# Patient Record
Sex: Male | Born: 1935
Health system: Southern US, Community
[De-identification: ages and names within clinical notes are randomized; demographics above are authoritative.]

## PROBLEM LIST (undated history)

## (undated) DIAGNOSIS — I779 Disorder of arteries and arterioles, unspecified: Secondary | ICD-10-CM

## (undated) DIAGNOSIS — I499 Cardiac arrhythmia, unspecified: Secondary | ICD-10-CM

## (undated) DIAGNOSIS — N189 Chronic kidney disease, unspecified: Secondary | ICD-10-CM

## (undated) DIAGNOSIS — Z923 Personal history of irradiation: Secondary | ICD-10-CM

## (undated) DIAGNOSIS — I2510 Atherosclerotic heart disease of native coronary artery without angina pectoris: Secondary | ICD-10-CM

## (undated) DIAGNOSIS — F419 Anxiety disorder, unspecified: Secondary | ICD-10-CM

## (undated) DIAGNOSIS — N400 Enlarged prostate without lower urinary tract symptoms: Secondary | ICD-10-CM

## (undated) DIAGNOSIS — R609 Edema, unspecified: Secondary | ICD-10-CM

## (undated) DIAGNOSIS — E785 Hyperlipidemia, unspecified: Secondary | ICD-10-CM

## (undated) DIAGNOSIS — I10 Essential (primary) hypertension: Secondary | ICD-10-CM

## (undated) DIAGNOSIS — I739 Peripheral vascular disease, unspecified: Secondary | ICD-10-CM

## (undated) DIAGNOSIS — R7303 Prediabetes: Secondary | ICD-10-CM

## (undated) DIAGNOSIS — R519 Headache, unspecified: Secondary | ICD-10-CM

## (undated) DIAGNOSIS — Z87442 Personal history of urinary calculi: Secondary | ICD-10-CM

## (undated) DIAGNOSIS — K219 Gastro-esophageal reflux disease without esophagitis: Secondary | ICD-10-CM

## (undated) DIAGNOSIS — M1990 Unspecified osteoarthritis, unspecified site: Secondary | ICD-10-CM

## (undated) DIAGNOSIS — D126 Benign neoplasm of colon, unspecified: Secondary | ICD-10-CM

## (undated) DIAGNOSIS — D689 Coagulation defect, unspecified: Secondary | ICD-10-CM

## (undated) DIAGNOSIS — D649 Anemia, unspecified: Secondary | ICD-10-CM

## (undated) DIAGNOSIS — C61 Malignant neoplasm of prostate: Secondary | ICD-10-CM

## (undated) DIAGNOSIS — M545 Low back pain, unspecified: Secondary | ICD-10-CM

## (undated) DIAGNOSIS — G4730 Sleep apnea, unspecified: Secondary | ICD-10-CM

## (undated) DIAGNOSIS — C679 Malignant neoplasm of bladder, unspecified: Secondary | ICD-10-CM

## (undated) DIAGNOSIS — K589 Irritable bowel syndrome without diarrhea: Secondary | ICD-10-CM

## (undated) DIAGNOSIS — K5792 Diverticulitis of intestine, part unspecified, without perforation or abscess without bleeding: Secondary | ICD-10-CM

## (undated) DIAGNOSIS — I639 Cerebral infarction, unspecified: Secondary | ICD-10-CM

## (undated) DIAGNOSIS — F17200 Nicotine dependence, unspecified, uncomplicated: Secondary | ICD-10-CM

## (undated) DIAGNOSIS — I509 Heart failure, unspecified: Secondary | ICD-10-CM

## (undated) HISTORY — DX: Peripheral vascular disease, unspecified: I73.9

## (undated) HISTORY — DX: Coagulation defect, unspecified: D68.9

## (undated) HISTORY — DX: Malignant neoplasm of bladder, unspecified: C67.9

## (undated) HISTORY — DX: Diverticulitis of intestine, part unspecified, without perforation or abscess without bleeding: K57.92

## (undated) HISTORY — DX: Essential (primary) hypertension: I10

## (undated) HISTORY — DX: Benign neoplasm of colon, unspecified: D12.6

## (undated) HISTORY — DX: Atherosclerotic heart disease of native coronary artery without angina pectoris: I25.10

## (undated) HISTORY — DX: Benign prostatic hyperplasia without lower urinary tract symptoms: N40.0

## (undated) HISTORY — DX: Heart failure, unspecified: I50.9

## (undated) HISTORY — PX: LITHOTRIPSY: SUR834

## (undated) HISTORY — DX: Irritable bowel syndrome, unspecified: K58.9

## (undated) HISTORY — DX: Cerebral infarction, unspecified: I63.9

## (undated) HISTORY — PX: ESOPHAGOGASTRODUODENOSCOPY (EGD) WITH ESOPHAGEAL DILATION: SHX5812

## (undated) HISTORY — DX: Malignant neoplasm of prostate: C61

## (undated) HISTORY — DX: Nicotine dependence, unspecified, uncomplicated: F17.200

## (undated) HISTORY — PX: CORONARY ANGIOPLASTY: SHX604

## (undated) HISTORY — DX: Hyperlipidemia, unspecified: E78.5

## (undated) HISTORY — PX: LUMBAR LAMINECTOMY: SHX95

## (undated) HISTORY — PX: CATARACT EXTRACTION W/ INTRAOCULAR LENS  IMPLANT, BILATERAL: SHX1307

## (undated) HISTORY — DX: Gastro-esophageal reflux disease without esophagitis: K21.9

## (undated) HISTORY — DX: Low back pain: M54.5

## (undated) HISTORY — DX: Low back pain, unspecified: M54.50

## (undated) HISTORY — DX: Disorder of arteries and arterioles, unspecified: I77.9

## (undated) HISTORY — PX: HEMORRHOID BANDING: SHX5850

## (undated) HISTORY — DX: Sleep apnea, unspecified: G47.30

## (undated) HISTORY — PX: APPENDECTOMY: SHX54

---

## 1999-01-13 ENCOUNTER — Emergency Department (HOSPITAL_COMMUNITY): Admission: EM | Admit: 1999-01-13 | Discharge: 1999-01-13 | Payer: Self-pay | Admitting: Emergency Medicine

## 1999-01-13 ENCOUNTER — Encounter: Payer: Self-pay | Admitting: Emergency Medicine

## 1999-01-15 ENCOUNTER — Emergency Department (HOSPITAL_COMMUNITY): Admission: EM | Admit: 1999-01-15 | Discharge: 1999-01-15 | Payer: Self-pay | Admitting: Emergency Medicine

## 1999-01-15 ENCOUNTER — Encounter: Payer: Self-pay | Admitting: Urology

## 1999-01-17 ENCOUNTER — Encounter: Payer: Self-pay | Admitting: Urology

## 1999-01-19 ENCOUNTER — Ambulatory Visit (HOSPITAL_COMMUNITY): Admission: RE | Admit: 1999-01-19 | Discharge: 1999-01-19 | Payer: Self-pay | Admitting: Urology

## 1999-01-19 ENCOUNTER — Encounter: Payer: Self-pay | Admitting: Urology

## 1999-02-21 ENCOUNTER — Ambulatory Visit (HOSPITAL_COMMUNITY): Admission: RE | Admit: 1999-02-21 | Discharge: 1999-02-21 | Payer: Self-pay | Admitting: Urology

## 1999-02-21 ENCOUNTER — Encounter: Payer: Self-pay | Admitting: Urology

## 1999-03-06 ENCOUNTER — Encounter: Payer: Self-pay | Admitting: Urology

## 1999-03-06 ENCOUNTER — Ambulatory Visit (HOSPITAL_COMMUNITY): Admission: RE | Admit: 1999-03-06 | Discharge: 1999-03-06 | Payer: Self-pay | Admitting: Urology

## 2000-08-24 ENCOUNTER — Encounter: Payer: Self-pay | Admitting: Emergency Medicine

## 2000-08-24 ENCOUNTER — Emergency Department (HOSPITAL_COMMUNITY): Admission: EM | Admit: 2000-08-24 | Discharge: 2000-08-24 | Payer: Self-pay | Admitting: Emergency Medicine

## 2000-08-31 ENCOUNTER — Encounter: Payer: Self-pay | Admitting: *Deleted

## 2000-08-31 ENCOUNTER — Ambulatory Visit (HOSPITAL_COMMUNITY): Admission: RE | Admit: 2000-08-31 | Discharge: 2000-08-31 | Payer: Self-pay | Admitting: *Deleted

## 2000-09-16 ENCOUNTER — Encounter: Payer: Self-pay | Admitting: *Deleted

## 2000-09-17 ENCOUNTER — Encounter: Payer: Self-pay | Admitting: *Deleted

## 2000-09-17 ENCOUNTER — Ambulatory Visit (HOSPITAL_COMMUNITY): Admission: RE | Admit: 2000-09-17 | Discharge: 2000-09-18 | Payer: Self-pay | Admitting: *Deleted

## 2003-10-27 HISTORY — PX: TRANSURETHRAL RESECTION OF PROSTATE: SHX73

## 2003-11-05 ENCOUNTER — Inpatient Hospital Stay (HOSPITAL_COMMUNITY): Admission: RE | Admit: 2003-11-05 | Discharge: 2003-11-07 | Payer: Self-pay | Admitting: Urology

## 2003-11-05 ENCOUNTER — Encounter (INDEPENDENT_AMBULATORY_CARE_PROVIDER_SITE_OTHER): Payer: Self-pay | Admitting: Specialist

## 2004-04-12 ENCOUNTER — Ambulatory Visit: Payer: Self-pay | Admitting: Pulmonary Disease

## 2004-10-11 ENCOUNTER — Ambulatory Visit (HOSPITAL_COMMUNITY): Admission: RE | Admit: 2004-10-11 | Discharge: 2004-10-11 | Payer: Self-pay | Admitting: Pulmonary Disease

## 2004-10-11 ENCOUNTER — Ambulatory Visit: Payer: Self-pay | Admitting: Pulmonary Disease

## 2004-11-07 ENCOUNTER — Ambulatory Visit: Payer: Self-pay | Admitting: Gastroenterology

## 2004-11-21 ENCOUNTER — Encounter (INDEPENDENT_AMBULATORY_CARE_PROVIDER_SITE_OTHER): Payer: Self-pay | Admitting: *Deleted

## 2004-11-21 ENCOUNTER — Ambulatory Visit: Payer: Self-pay | Admitting: Gastroenterology

## 2005-04-11 ENCOUNTER — Ambulatory Visit: Payer: Self-pay | Admitting: Pulmonary Disease

## 2006-03-25 ENCOUNTER — Ambulatory Visit: Payer: Self-pay | Admitting: Pulmonary Disease

## 2006-05-15 ENCOUNTER — Emergency Department (HOSPITAL_COMMUNITY): Admission: EM | Admit: 2006-05-15 | Discharge: 2006-05-15 | Payer: Self-pay | Admitting: Emergency Medicine

## 2006-05-28 DIAGNOSIS — C61 Malignant neoplasm of prostate: Secondary | ICD-10-CM

## 2006-05-28 HISTORY — DX: Malignant neoplasm of prostate: C61

## 2006-06-14 ENCOUNTER — Ambulatory Visit (HOSPITAL_BASED_OUTPATIENT_CLINIC_OR_DEPARTMENT_OTHER): Admission: RE | Admit: 2006-06-14 | Discharge: 2006-06-14 | Payer: Self-pay | Admitting: Urology

## 2007-03-26 ENCOUNTER — Ambulatory Visit: Payer: Self-pay | Admitting: Pulmonary Disease

## 2007-05-02 ENCOUNTER — Ambulatory Visit (HOSPITAL_COMMUNITY): Admission: RE | Admit: 2007-05-02 | Discharge: 2007-05-02 | Payer: Self-pay | Admitting: Urology

## 2007-05-19 ENCOUNTER — Encounter: Payer: Self-pay | Admitting: Pulmonary Disease

## 2007-05-29 ENCOUNTER — Ambulatory Visit: Admission: RE | Admit: 2007-05-29 | Discharge: 2007-08-27 | Payer: Self-pay | Admitting: Radiation Oncology

## 2007-06-02 ENCOUNTER — Encounter: Payer: Self-pay | Admitting: Pulmonary Disease

## 2007-08-25 ENCOUNTER — Encounter: Admission: RE | Admit: 2007-08-25 | Discharge: 2007-08-25 | Payer: Self-pay | Admitting: Urology

## 2007-08-28 ENCOUNTER — Ambulatory Visit: Admission: RE | Admit: 2007-08-28 | Discharge: 2007-11-19 | Payer: Self-pay | Admitting: Radiation Oncology

## 2007-09-02 ENCOUNTER — Encounter: Payer: Self-pay | Admitting: Pulmonary Disease

## 2007-10-03 ENCOUNTER — Ambulatory Visit (HOSPITAL_BASED_OUTPATIENT_CLINIC_OR_DEPARTMENT_OTHER): Admission: RE | Admit: 2007-10-03 | Discharge: 2007-10-03 | Payer: Self-pay | Admitting: Urology

## 2008-07-02 ENCOUNTER — Ambulatory Visit: Payer: Self-pay | Admitting: Internal Medicine

## 2008-07-02 DIAGNOSIS — N401 Enlarged prostate with lower urinary tract symptoms: Secondary | ICD-10-CM

## 2008-07-02 DIAGNOSIS — M545 Low back pain, unspecified: Secondary | ICD-10-CM | POA: Insufficient documentation

## 2008-07-02 DIAGNOSIS — K219 Gastro-esophageal reflux disease without esophagitis: Secondary | ICD-10-CM | POA: Insufficient documentation

## 2008-07-02 DIAGNOSIS — Z860100 Personal history of colon polyps, unspecified: Secondary | ICD-10-CM | POA: Insufficient documentation

## 2008-07-02 DIAGNOSIS — N138 Other obstructive and reflux uropathy: Secondary | ICD-10-CM | POA: Insufficient documentation

## 2008-07-02 DIAGNOSIS — I10 Essential (primary) hypertension: Secondary | ICD-10-CM | POA: Insufficient documentation

## 2008-07-02 DIAGNOSIS — J40 Bronchitis, not specified as acute or chronic: Secondary | ICD-10-CM | POA: Insufficient documentation

## 2008-07-02 DIAGNOSIS — K589 Irritable bowel syndrome without diarrhea: Secondary | ICD-10-CM | POA: Insufficient documentation

## 2008-07-02 DIAGNOSIS — E785 Hyperlipidemia, unspecified: Secondary | ICD-10-CM | POA: Insufficient documentation

## 2008-09-06 ENCOUNTER — Ambulatory Visit: Payer: Self-pay | Admitting: Pulmonary Disease

## 2008-09-06 ENCOUNTER — Telehealth (INDEPENDENT_AMBULATORY_CARE_PROVIDER_SITE_OTHER): Payer: Self-pay | Admitting: *Deleted

## 2008-09-06 DIAGNOSIS — C61 Malignant neoplasm of prostate: Secondary | ICD-10-CM | POA: Insufficient documentation

## 2008-09-06 LAB — CONVERTED CEMR LAB: Blood Glucose, Fingerstick: 130

## 2008-09-07 ENCOUNTER — Encounter: Payer: Self-pay | Admitting: Pulmonary Disease

## 2008-09-08 DIAGNOSIS — J329 Chronic sinusitis, unspecified: Secondary | ICD-10-CM | POA: Insufficient documentation

## 2008-09-08 DIAGNOSIS — I2510 Atherosclerotic heart disease of native coronary artery without angina pectoris: Secondary | ICD-10-CM | POA: Insufficient documentation

## 2008-09-08 DIAGNOSIS — J309 Allergic rhinitis, unspecified: Secondary | ICD-10-CM | POA: Insufficient documentation

## 2008-09-08 DIAGNOSIS — E119 Type 2 diabetes mellitus without complications: Secondary | ICD-10-CM | POA: Insufficient documentation

## 2008-09-08 DIAGNOSIS — I679 Cerebrovascular disease, unspecified: Secondary | ICD-10-CM | POA: Insufficient documentation

## 2008-09-08 DIAGNOSIS — Z9861 Coronary angioplasty status: Secondary | ICD-10-CM

## 2008-09-08 DIAGNOSIS — F17200 Nicotine dependence, unspecified, uncomplicated: Secondary | ICD-10-CM | POA: Insufficient documentation

## 2008-09-08 LAB — CONVERTED CEMR LAB
AST: 20 units/L (ref 0–37)
Albumin: 4.2 g/dL (ref 3.5–5.2)
Alkaline Phosphatase: 53 units/L (ref 39–117)
BUN: 19 mg/dL (ref 6–23)
Basophils Absolute: 0 10*3/uL (ref 0.0–0.1)
Basophils Relative: 0.3 % (ref 0.0–3.0)
Chloride: 109 meq/L (ref 96–112)
Eosinophils Relative: 1.7 % (ref 0.0–5.0)
GFR calc non Af Amer: 77.61 mL/min (ref >60)
Glucose, Bld: 112 mg/dL — ABNORMAL HIGH (ref 70–99)
LDL Cholesterol: 99 mg/dL (ref 0–99)
Lymphs Abs: 1.1 10*3/uL (ref 0.7–4.0)
MCHC: 34.1 g/dL (ref 30.0–36.0)
MCV: 89.7 fL (ref 78.0–100.0)
Neutro Abs: 3.5 10*3/uL (ref 1.4–7.7)
Neutrophils Relative %: 68.2 % (ref 43.0–77.0)
Potassium: 4.3 meq/L (ref 3.5–5.1)
RDW: 13 % (ref 11.5–14.6)
Total CHOL/HDL Ratio: 4
VLDL: 24 mg/dL (ref 0.0–40.0)

## 2008-09-13 ENCOUNTER — Telehealth (INDEPENDENT_AMBULATORY_CARE_PROVIDER_SITE_OTHER): Payer: Self-pay | Admitting: *Deleted

## 2008-09-28 ENCOUNTER — Telehealth (INDEPENDENT_AMBULATORY_CARE_PROVIDER_SITE_OTHER): Payer: Self-pay | Admitting: *Deleted

## 2008-09-30 ENCOUNTER — Ambulatory Visit: Payer: Self-pay | Admitting: Internal Medicine

## 2008-10-21 ENCOUNTER — Ambulatory Visit: Payer: Self-pay | Admitting: Internal Medicine

## 2008-10-21 HISTORY — PX: COLONOSCOPY: SHX174

## 2008-10-28 ENCOUNTER — Encounter: Payer: Self-pay | Admitting: Pulmonary Disease

## 2008-11-15 ENCOUNTER — Ambulatory Visit (HOSPITAL_COMMUNITY): Admission: RE | Admit: 2008-11-15 | Discharge: 2008-11-15 | Payer: Self-pay | Admitting: Urology

## 2009-03-01 ENCOUNTER — Encounter: Payer: Self-pay | Admitting: Pulmonary Disease

## 2009-03-14 ENCOUNTER — Telehealth: Payer: Self-pay | Admitting: Pulmonary Disease

## 2009-03-15 ENCOUNTER — Ambulatory Visit: Payer: Self-pay | Admitting: Pulmonary Disease

## 2009-03-16 LAB — CONVERTED CEMR LAB
CO2: 31 meq/L (ref 19–32)
Creatinine, Ser: 1.1 mg/dL (ref 0.4–1.5)
GFR calc non Af Amer: 69.43 mL/min (ref >60)
Sodium: 141 meq/L (ref 135–145)

## 2009-04-13 ENCOUNTER — Ambulatory Visit: Payer: Self-pay | Admitting: Pulmonary Disease

## 2009-05-04 ENCOUNTER — Encounter: Payer: Self-pay | Admitting: Pulmonary Disease

## 2009-05-19 ENCOUNTER — Encounter: Payer: Self-pay | Admitting: Pulmonary Disease

## 2009-06-10 ENCOUNTER — Encounter: Payer: Self-pay | Admitting: Pulmonary Disease

## 2009-06-22 ENCOUNTER — Telehealth: Payer: Self-pay | Admitting: Pulmonary Disease

## 2009-07-12 ENCOUNTER — Ambulatory Visit: Payer: Self-pay | Admitting: Pulmonary Disease

## 2009-07-18 ENCOUNTER — Telehealth (INDEPENDENT_AMBULATORY_CARE_PROVIDER_SITE_OTHER): Payer: Self-pay | Admitting: *Deleted

## 2009-08-16 ENCOUNTER — Encounter: Payer: Self-pay | Admitting: Pulmonary Disease

## 2009-08-31 ENCOUNTER — Telehealth: Payer: Self-pay | Admitting: Pulmonary Disease

## 2009-09-15 ENCOUNTER — Telehealth (INDEPENDENT_AMBULATORY_CARE_PROVIDER_SITE_OTHER): Payer: Self-pay | Admitting: *Deleted

## 2009-10-03 ENCOUNTER — Telehealth: Payer: Self-pay | Admitting: Pulmonary Disease

## 2009-10-20 ENCOUNTER — Encounter: Payer: Self-pay | Admitting: Pulmonary Disease

## 2009-12-08 ENCOUNTER — Telehealth (INDEPENDENT_AMBULATORY_CARE_PROVIDER_SITE_OTHER): Payer: Self-pay | Admitting: *Deleted

## 2009-12-12 ENCOUNTER — Telehealth (INDEPENDENT_AMBULATORY_CARE_PROVIDER_SITE_OTHER): Payer: Self-pay | Admitting: *Deleted

## 2009-12-26 ENCOUNTER — Telehealth (INDEPENDENT_AMBULATORY_CARE_PROVIDER_SITE_OTHER): Payer: Self-pay | Admitting: *Deleted

## 2010-02-08 ENCOUNTER — Ambulatory Visit: Payer: Self-pay | Admitting: Pulmonary Disease

## 2010-02-09 ENCOUNTER — Ambulatory Visit: Payer: Self-pay | Admitting: Pulmonary Disease

## 2010-02-11 LAB — CONVERTED CEMR LAB
ALT: 19 units/L (ref 0–53)
Albumin: 4.3 g/dL (ref 3.5–5.2)
BUN: 32 mg/dL — ABNORMAL HIGH (ref 6–23)
Bilirubin, Direct: 0.2 mg/dL (ref 0.0–0.3)
CO2: 30 meq/L (ref 19–32)
Calcium: 9.3 mg/dL (ref 8.4–10.5)
Chloride: 107 meq/L (ref 96–112)
Eosinophils Relative: 1.9 % (ref 0.0–5.0)
HDL: 32.2 mg/dL — ABNORMAL LOW (ref >39.00)
Hemoglobin: 14.2 g/dL (ref 13.0–17.0)
Hgb A1c MFr Bld: 6.7 % — ABNORMAL HIGH (ref 4.6–6.5)
LDL Cholesterol: 66 mg/dL (ref 0–99)
Lymphocytes Relative: 27 % (ref 12.0–46.0)
MCV: 89.4 fL (ref 78.0–100.0)
Monocytes Relative: 11.5 % (ref 3.0–12.0)
Neutro Abs: 3.3 10*3/uL (ref 1.4–7.7)
Neutrophils Relative %: 59.2 % (ref 43.0–77.0)
RDW: 13.5 % (ref 11.5–14.6)
Sodium: 143 meq/L (ref 135–145)
Triglycerides: 166 mg/dL — ABNORMAL HIGH (ref 0.0–149.0)
VLDL: 33.2 mg/dL (ref 0.0–40.0)

## 2010-02-14 ENCOUNTER — Telehealth (INDEPENDENT_AMBULATORY_CARE_PROVIDER_SITE_OTHER): Payer: Self-pay | Admitting: *Deleted

## 2010-03-17 ENCOUNTER — Ambulatory Visit: Payer: Self-pay | Admitting: Pulmonary Disease

## 2010-04-07 ENCOUNTER — Encounter: Payer: Self-pay | Admitting: Pulmonary Disease

## 2010-04-10 ENCOUNTER — Telehealth (INDEPENDENT_AMBULATORY_CARE_PROVIDER_SITE_OTHER): Payer: Self-pay | Admitting: *Deleted

## 2010-06-29 NOTE — Progress Notes (Signed)
Summary: rx  Phone Note Call from Patient Call back at Home Phone [phone]   Caller: spouse-Mary Call For: nadel Reason for Call: Talk to Nurse Summary of Call: re: RX Losartan/HCTZ  -  called [facility] for refill and was told that he was no longer on this medicine per his doctor.  Can you please check on this.  Pt doesn't recall being taken off med. Initial call taken by: Eugene Gavia,  December 08, 2009 9:01 AM  Follow-up for Phone Call        lmomtcb Randell Loop [facility]  December 08, 2009 9:04 AM   pts wife called me back and stated that [facility] told them that per [place] the pt is no longer on this med.  i reviewed SN last note and notes in between and there is nothing saying we stopped this med. and it had been sent to [facility] for refills 2 months ago.  i resent the rx to [facility] for they hyzaar.  pts wife is aware Randell Loop CMA  December 08, 2009 9:23 AM     Prescriptions: HYZAAR 100-25 MG TABS (LOSARTAN POTASSIUM-HCTZ) take 1 tab by mouth once daily in the AM...  #90 x 4   Entered by:   Randell Loop CMA   Authorized by:   Michele Mcalpine MD   Signed by:   Randell Loop CMA on 12/08/2009   Method used:   Faxed to ...       [facility] (mail-order)       [address] [place], [place]  [address]       Ph: [phone]       Fax: [phone]   RxID:   [id number]

## 2010-06-29 NOTE — Progress Notes (Signed)
Summary: refill on rx and questions on med  Phone Note Call from Patient Call back at Home Phone [phone]   Caller: [name] (wife) Call For: Dillon Young Reason for Call: Refill Medication Summary of Call: wants to know what he is suppose to do with the medicine losartan. on the bottle it says take one tablet a day. dr Kriste Basque told him to take to take a half a pill a day.  she wants to know what he is really suppose to be taking.  she also would need a refill on it sent to [facility]  at [phone] it will need to be a 90 day rx  Initial call taken by: Valinda Hoar,  August 31, 2009 8:27 AM  Follow-up for Phone Call        pt wife was not aware the pt was to stop cozaar and hctz and just take hyzaar. SH elaos does not know where the rx is that SN printed on the day of the ov. Sh eis requesting rx be sent to [facility]. RX sent for hyzaar. pt aware. [name] CMA  August 31, 2009 9:28 AM     Prescriptions: HYZAAR 100-25 MG TABS (LOSARTAN POTASSIUM-HCTZ) take 1 tab by mouth once daily in the AM...  #90 x 4   Entered by:   [name] CMA   Authorized by:   Michele Mcalpine MD   Signed by:   [name] CMA on 08/31/2009   Method used:   Faxed to ...       [facility] (mail-order)             ,          Ph: [phone]       Fax: [phone]   RxID:   [id number]

## 2010-06-29 NOTE — Progress Notes (Signed)
Summary: refill losartan  Phone Note Call from Patient Call back at [facility] Phone [phone]   Caller: Patient Call For: Latesia Norrington Summary of Call: pt requests a "2 wk " supply of losartan  potassium [facility]ill. [facility] [address]  Initial call taken by: Tivis Ringer, CNA,  June 22, 2009 2:59 PM  Follow-up for Phone Call        Pt request a 2 week supply for cozaar sent to local pharmacy because he is waiting on his shipment from [facility]. [facility]  Pt also states that he is now taking cozaar 150mg  daily instead of 100mg . Also pt states that he needs rx for HCTZ sent to [facility] for 90 days with refills. This med has also changed. he is now taking HCTZ 12.5 twice daily. Both meds have been changed on med list.  rx sent. [name] CMA  June 22, 2009 3:19 PM     New/Updated Medications: COZAAR 100 MG TABS (LOSARTAN POTASSIUM) take 1 and 1/2 tabs daily HYDROCHLOROTHIAZIDE 12.5 MG CAPS (HYDROCHLOROTHIAZIDE) Take 1 tablet by mouth two times a day Prescriptions: COZAAR 100 MG TABS (LOSARTAN POTASSIUM) take 1 and 1/2 tabs daily  #21 x 0   Entered by:   [name] CMA   Authorized by:   Michele Mcalpine MD   Signed by:   [name] CMA on 06/22/2009   Method used:   [facility] to        [id number]       [address]       [place], [place]  [address]       Ph: [phone] or [phone]       Fax: [phone]   RxID:   [id number] HYDROCHLOROTHIAZIDE 12.5 MG CAPS (HYDROCHLOROTHIAZIDE) Take 1 tablet by mouth two times a day  #180 x 3   Entered by:   [name] CMA   Authorized by:   Michele Mcalpine MD   Signed by:   [name] CMA on 06/22/2009   Method used:   Electronically to        [facility] (mail-order)             ,          Ph: [phone]       Fax: [phone]   RxID:   [id number]

## 2010-06-29 NOTE — Progress Notes (Signed)
Summary: rx-correction  Phone Note Call from Patient Call back at Home Phone [phone]   Caller: Spouse-Mary Call For: nadel Reason for Call: Talk to Nurse Summary of Call: Pt's Hyzaar generic was [facility] on 12[facility]08[facility]2009 - Pt no longer gets his prescriptions through them - It needs to go to [facility].  Can you please re- send to [facility]. Initial call taken by: Eugene Gavia,  December 12, 2009 11:14 AM  Follow-up for Phone Call        called and spoke with pt's wife and informed her rx sent to correct pharmacy.  Aundra Millet Reynolds LPN  December 12, 2009 11:18 AM     Prescriptions: HYZAAR 100-25 MG TABS (LOSARTAN POTASSIUM-HCTZ) take 1 tab by mouth once daily in the AM...  #90 x 4   Entered by:   Arman Filter LPN   Authorized by:   Michele Mcalpine MD   Signed by:   Arman Filter LPN on 57/84/6962   Method used:   Electronically to        [facility]* (retail)             ,          Ph: [phone]       Fax: [phone]   RxID:   [id number]

## 2010-06-29 NOTE — Progress Notes (Signed)
Summary: cough/ sore throat  Phone Note Call from Patient   Caller: Spouse mary Call For: nadel Summary of Call: need something for cough/ sore throat [facility] [address] Initial call taken by: Rickard Patience,  July 18, 2009 4:28 PM  Follow-up for Phone Call        per sn ok for zpak as directed, delsym otc for cough, mucinex dm twice a day for congestion. pt aware of dr Jodelle Green recs. meds phoned to pharmacy  Follow-up by: Philipp Deputy CMA,  July 18, 2009 4:39 PM    New/Updated Medications: ZITHROMAX Z-PAK 250 MG TABS (AZITHROMYCIN) take as directed Prescriptions: ZITHROMAX Z-PAK 250 MG TABS (AZITHROMYCIN) take as directed  #1 x 0   Entered by:   Philipp Deputy CMA   Authorized by:   Michele Mcalpine MD   Signed by:   Philipp Deputy CMA on 07/18/2009   Method used:   [facility] to        [id number]       [address]       [place], [place]  [address]       Ph: [phone] or [phone]       Fax: [phone]   RxID:   [id number]

## 2010-06-29 NOTE — Progress Notes (Signed)
Summary: rx req/ cough/ sinus---rx for z pak  Phone Note Call from Patient Call back at Home Phone [phone]   Caller: Spouse Call For: NADEL Summary of Call: pt was seen last week. today c/o dry cough/ low grade fever/ body aching all over x 1 day. also sinus pressure.  denies chills, V and N. has taken musinex OTC. requests [facility] [address] spouse mary at home # above Initial call taken by: Tivis Ringer, CNA,  February 14, 2010 2:50 PM  Follow-up for Phone Call        Spoke with pt's spouse.  She states that pt "feels achy all over", PND, sore throat and prod cough with clear sputum- onset was 1 day ago. Had temp of 100 last night.  NKDA Follow-up by: Vernie Murders,  February 14, 2010 2:57 PM  Additional Follow-up for Phone Call Additional follow up Details #1::        per SN, call in Z pak # 1 x 0 refills.  Also take Mucinex DM 2 by mouth two times a day with fluids and Tylenol alternating with Advil as needed for the fever and body aches.   called and spoke with pt's wife and informed her of Sn's recs.  Wife verbalized understanding and stated she will relay message to pt.  wife also aware abx sent to pharmacy.  Megan Reynolds LPN  February 14, 2010 3:27 PM     New/Updated Medications: ZITHROMAX Z-PAK 250 MG TABS (AZITHROMYCIN) take as directed Prescriptions: ZITHROMAX Z-PAK 250 MG TABS (AZITHROMYCIN) take as directed  #1 x 0   Entered by:   Arman Filter LPN   Authorized by:   Michele Mcalpine MD   Signed by:   Arman Filter LPN on 44/07/4740   Method used:   [facility]ronically to        [id number]       [address]       [place], [place]  [address]       Ph: [phone] or [phone]       Fax: [phone]   RxID:   [id number]

## 2010-06-29 NOTE — Progress Notes (Signed)
Summary: high bp  Phone Note Call from Patient Call back at Home Phone [phone]   Caller: Spouse Call For: Sagal Gayton Summary of Call: spouse would like pt to be worked in w/ sn this week re: high bp.  Lynnell Catalan taken by Tivis Ringer.   Follow-up for Phone Call        [facility] Vernie Murders  Oct 03, 2009 3:29 PM  I spoke with wife and she stated she spoke to someone yesterday and there is nothing further needed at this time. I will sign off on note. [name] CMA  Oct 04, 2009 9:44 AM

## 2010-06-29 NOTE — Progress Notes (Signed)
Summary: PRESCRIPT  Phone Note Call from Patient   Caller: Spouse Call For: NADEL Summary of Call:  PT NEED REFILL  CALLED TO [facility] FOR METOPROLO 50MG  Initial call taken by: Rickard Patience,  September 15, 2009 8:32 AM  Follow-up for Phone Call        rx sent to pharmacy.  pt's spouse aware.  Aundra Millet Reynolds LPN  September 15, 2009 9:20 AM     Prescriptions: TOPROL XL 50 MG XR24H-TAB (METOPROLOL SUCCINATE) take 1/2 tab by mouth once daily  #90 x 3   Entered by:   Arman Filter LPN   Authorized by:   Michele Mcalpine MD   Signed by:   Arman Filter LPN on 29/56/2130   Method used:   Electronically to        [facility]* (mail-order)             ,          Ph: [phone]       Fax: [phone]   RxID:   [id number]

## 2010-06-29 NOTE — Progress Notes (Signed)
Summary: PRESCRIPT  Phone Note Call from Patient   Caller: Patient Call For: NADEL Summary of Call: NEED PRESCRIPT FOR 90 DAY SUPPLY FAX TO [facility] FOR METOPROLOL 50MG  Initial call taken by: Rickard Patience,  April 10, 2010 1:39 PM  Follow-up for Phone Call        Toprol XL rx last sent on 09/15/09 #90 x 3.  Spoke with pt's wife but she states the bottle says they have no more refills.    [employer], spoke with [place].  Per Almira Coaster last shipment was on 08/27/09 and pt does have 4 refills left through 09/15/2010.  Pt will need to call [phone] to request rx.    Called, spoke with pt.  He was informed he does have refills left for toprol and to call above number to request shipment.  He verbalized understanding. Follow-up by: Gweneth Dimitri RN,  April 10, 2010 3:23 PM

## 2010-06-29 NOTE — Letter (Signed)
Summary: [facility]  [facility]   Imported By: Sherian Rein 08/24/2009 13:50:19  _____________________________________________________________________  External Attachment:    Type:   Image     Comment:   External Document

## 2010-06-29 NOTE — Letter (Signed)
Summary: [facility]  [facility]   Imported By: Lester Antelope 05/01/2010 08:58:10  _____________________________________________________________________  External Attachment:    Type:   Image     Comment:   External Document

## 2010-06-29 NOTE — Consult Note (Signed)
Summary: [facility]  [facility]   Imported By: Sherian Rein 10/27/2009 13:16:24  _____________________________________________________________________  External Attachment:    Type:   Image     Comment:   External Document

## 2010-06-29 NOTE — Letter (Signed)
Summary: Office Note / [facility]  Office Note / [facility]   Imported By: Lennie Odor 06/02/2009 12:19:21  _____________________________________________________________________  External Attachment:    Type:   Image     Comment:   External Document

## 2010-06-29 NOTE — Assessment & Plan Note (Signed)
Summary: 6 months/apc   CC:  7 month ROV & review of mult medical problems....  History of Present Illness: 77 y/o WM here for a follow up visit... he has multiple medical problems as noted below...  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    ~  Apr10:   I last saw him Oct07 for a yearly follow up at that time- see problem list below... states he's doing well- no complaints... he states he quit smoking 2/29/10, and has been exercising- walking for 45 min 3x per week... he has noted a cough on the Lotensin therapy & we will need to change this med... he is also c/o some insomnia and wants a sleeping pill...   ~  July 12, 2009:  his BP was elevated & DrWeintraub placed him on HCT 25mg  +KCl daily... he states that DrRW did his Chol check as well & the pt reports that it was "OK" on Simva40 + Zetia10... he had the seasonal flu vaccine in 2010 & he had a follow up colonoscopy by DrGessner- no recurrent polyps... he has had several visits w/ DrBates for ENT- following his earaches & hearing...   ~  February 08, 2010:  25mo ROV- doing reasonably well but notes some fatigue & we discussed checking CXR (NAD) & fasting blood work (see below)... he notes tinnitus & we discussed Rx w/ background sounds to mask, consider f/u ENT if not helping... he saw DrWeintraub 3/11 for Cards f/u- doing well & no changes made (he noted some leg cramps & OK to decr HCTZ to 12.5mg  if nec)...    Current Problems:   ALLERGIC RHINITIS (ICD-477.9) - spring allergies are a problem w/ all the pollen... he uses OTC antihistamines + nasal saline, FLONASE, Mucinex, etc...  Hx of SINUSITIS (ICD-473.9) - prev hx of maxillary sinusitis treated w/ antibiotics, nasal saline, etc... he has finally quit smoking... MRI Brain 5/06 showed complete filling of the right max sinus w/ fluid... he is followed by [facility] for ENT...  ? of SLEEP APNEA (ICD-780.57) - DrWeintraub's notes  indicate remote hx of sleep apnea but we don't have any records of prev sleep study (done at [facility])... notes some snoring complaints but no daytime hypersomnolence etc... he refused to wear CPAP although DrRW wanted him on this Rx.  Hx of BRONCHITIS (ICD-490) - occas bronchial infections treated w/ antibiotics... baseline CXR w/ calcif in Ao & prom fat pad at LV apex, clear lungs... can't find prev PFT's...  ~  CXR 4/10 showed calcif in Ao, clear lungs, NAD.Marland Kitchen.  ~  CXR 9/11 showed prom mediastinal fat, clear lungs, NAD...  CIGARETTE SMOKER (ICD-305.1) - he tells me that he quit smoking 07/25/08 (he's a leap year baby)...  HYPERTENSION (ICD-401.9) - on TOPROL XL 50mg /d, NORVASC 10mg /d, COZAAR 100mg /d, & HCTZ 25mg /d + KCl 107mEq/d... BP= 138/68 and similar at home... denies HA, fatigue, visual changes, CP, palipit, dizziness, syncope, dyspnea, edema, etc...   CORONARY ARTERY DISEASE (ICD-414.00) - on ASA 81mg /d... followed by DrWeintraub w/ known CAD & PCI to RCA & CIRC in 1994 & 1995...  ~  2DEcho 12/08 showed norm LV & mild AoV sclerosis; & cardiolite was neg for ischemia...  ~  2DEcho 6/10 showed mild conc LVH, norm LVF w/ EF= 55%, norm wall motion.  ~  f/u DrRW 12/10 & HCTZ incr to 25mg /d, and ?Cozaar incr to 150mg /d (which he never did)...  ~  Myoview 1/11 showed scar  present, no ischemia, +ectopy, no change from prev...  ~  f/u DrWeintraub 3/11- stable, no changes made.  CEREBROVASCULAR DISEASE (ICD-437.9) - prev MRI Brain 5/06 showed atrophy and small vessel disease + mild intracranial atherosclerotic changes on MRA... on ASA 81mg /d...  HYPERLIPIDEMIA (ICD-272.4) - on ZOCOR 40mg /d + ZETIA 10mg /d w/ labs checked by DrWeintraub according to the pt....  ~  FLP 4/10 here on Simva80 showed TChol 163, TG 120, HDL 41, LDL 99  ~  FLP 9/11 here on Simva40+Zetia10 showed TChol 131, TG 166, HDL 32, LDL 66  DIABETES MELLITUS, BORDERLINE (ICD-790.29) - he is overweight- 202# (stable x 11yrs) w/ 5'  10" frame = BMI 30... BS values have been in the 125 - 130 range... we discussed low carb/ no sweets diet...  ~  labs 4/10 (wt=205#) showed BS= 112, HgA1c= 6.2  ~  labs 9/11 (wt=202#) showed BS= 121, A1c= 6.7.Marland KitchenMarland Kitchen needs better diet, get weight down.  GERD (ICD-530.81) - he denies GI symptoms...  IRRITABLE BOWEL SYNDROME (ICD-564.1) COLONIC POLYPS (ICD-211.3) - he had a follow up colonoscopy by DrGessner 5/10 that showed divertics, ?radiation proctitis, hems, & no recurrent polyps.  BENIGN PROSTATIC HYPERTROPHY, WITH OBSTRUCTION (ICD-600.01) - s/p TURP 6/05 & path benign. ADENOCARCINOMA, PROSTATE (ICD-185) - followed by ZOXWRUEA w/ prostate Ca diagnosed in 11/09 w/ PSA= 5.58... s/p XRT by DrManning completed 4/09, and then radium seed implants... he continues to follow up w/ DrDavis Q24mo but we don't have any of his notes.  ~  9/11:  he tells me he has an appt w/ DrWrenn  10/11.  LOW BACK PAIN SYNDROME (ICD-724.2) - hx lumbar laminectomy in the past...   Preventive Screening-Counseling & Management  Alcohol-Tobacco     Smoking Status: quit     Packs/Day: 1/2     Year Started: age 35     Year Quit: 2009  Allergies (verified): No Known Drug Allergies  Comments:  Nurse/Medical Assistant: The patient's medications and allergies were reviewed with the patient and were updated in the Medication and Allergy Lists.  Past History:  Past Medical History: ALLERGIC RHINITIS (ICD-477.9) Hx of SINUSITIS (ICD-473.9) ? of SLEEP APNEA (ICD-780.57) Hx of BRONCHITIS (ICD-490) CIGARETTE SMOKER (ICD-305.1) HYPERTENSION (ICD-401.9) CORONARY ARTERY DISEASE (ICD-414.00) CEREBROVASCULAR DISEASE (ICD-437.9) HYPERLIPIDEMIA (ICD-272.4) DIABETES MELLITUS, BORDERLINE (ICD-790.29) GERD (ICD-530.81) IRRITABLE BOWEL SYNDROME (ICD-564.1) COLONIC POLYPS (ICD-211.3) BENIGN PROSTATIC HYPERTROPHY, WITH OBSTRUCTION (ICD-600.01) ADENOCARCINOMA, PROSTATE (ICD-185) LOW BACK PAIN SYNDROME (ICD-724.2)  Past  Surgical History: S/P appendectomy S/P lumbar laminectomy S/P TURP 6/05 by [id number] S/P prostate bx w/ adenoCa 11/08 w/ subseq XRT & seed implants  Family History: Reviewed history from 04/13/2009 and no changes required. positive for heart diease  Social History: Reviewed history from 04/13/2009 and no changes required. married former smoker- x55-94yrs 3/4ppd--quit smoking on 2-29-2010 occ alcohol works at [employer]  Review of Systems      See HPI       The patient complains of decreased hearing.  The patient denies anorexia, fever, weight loss, weight gain, vision loss, hoarseness, chest pain, syncope, dyspnea on exertion, peripheral edema, prolonged cough, headaches, hemoptysis, abdominal pain, melena, hematochezia, severe indigestion/heartburn, hematuria, incontinence, muscle weakness, suspicious skin lesions, transient blindness, difficulty walking, depression, unusual weight change, abnormal bleeding, enlarged lymph nodes, and angioedema.    Vital Signs:  Patient profile:   77 year old male Height:      69.5 inches Weight:      202 pounds BMI:     29.51 O2 Sat:  96 % on room air Temp:     97.1 degrees F oral Pulse rate:   55 / minute BP sitting:   138 / 68  (left arm) Cuff size:   regular  Vitals Entered By: Randell Loop CMA (February 08, 2010 12:16 PM)  O2 Sat at Rest %:  96 O2 Flow:  room air CC: 7 month ROV & review of mult medical problems... Is Patient Diabetic? No Pain Assessment Patient in pain? no      Comments meds updated today with pt   Physical Exam  Additional Exam:  WD, WN, 77 y/o WM in NAD... GENERAL:  Alert & oriented; pleasant & cooperative... HEENT:  [place]/AT, EOM-wnl, PERRLA, EACs-clear, TMs-wnl, NOSE-clear, THROAT-clear & wnl. NECK:  Supple w/ fairROM; no JVD; normal carotid impulses w/o bruits; no thyromegaly or nodules palpated; no lymphadenopathy. CHEST:  Clear to P & A; without wheezes/ rales/ or rhonchi. HEART:   Regular Rhythm; without murmurs/ rubs/ or gallops. ABDOMEN:  Soft & nontender; normal bowel sounds; no organomegaly or masses detected. RECTAL:  dererred to [facility] EXT: without deformities or arthritic changes; no varicose veins/ venous insuffic/ or edema. NEURO:  CN's intact; motor testing normal; sensory testing normal; gait normal & balance OK. DERM:  No lesions noted; no rash etc...    CXR  Procedure date:  02/08/2010  Findings:      CHEST - 2 VIEW Comparison: 09/06/2008   Findings: The lung volumes are normal.  No confluent airspace opacities or pleural effusions are seen.  Increased opacity seen projecting over the right heart border is unchanged and may relate to prominent mediastinal fat.  The heart is normal in size.  The upper abdomen and osseous structures are within normal limits.   IMPRESSION: No acute findings.   Read By:  Henrene Pastor,  M.D.   MISC. Report  Procedure date:  02/09/2010  Findings:      Lipid Panel (LIPID)   Cholesterol               131 mg/dL                   4-132   Triglycerides        [H]  166.0 mg/dL                 4.4-010.2   HDL                  [L]  72.53 mg/dL                 >66.44   LDL Cholesterol           66 mg/dL                    0-34  BMP (METABOL)   Sodium                    143 mEq/L                   135-145   Potassium                 4.7 mEq/L                   3.5-5.1   Chloride                  107 mEq/L  96-112   Carbon Dioxide            30 mEq/L                    19-32   Glucose              [H]  121 mg/dL                   81-19   BUN                  [H]  32 mg/dL                    1-47   Creatinine                1.1 mg/dL                   8.2-9.5   Calcium                   9.3 mg/dL                   6.2-13.0   GFR                       66.46 mL/min                >60  Hepatic/Liver Function Panel (HEPATIC)   Total Bilirubin           0.8 mg/dL                   8.6-5.7    Direct Bilirubin          0.2 mg/dL                   8.4-6.9   Alkaline Phosphatase      46 U/L                      39-117   AST                       19 U/L                      0-37   ALT                       19 U/L                      0-53   Total Protein             7.0 g/dL                    6.2-9.5   Albumin                   4.3 g/dL                    2.8-4.1  Comments:      CBC Platelet w/Diff (CBCD)   White Cell Count          5.6 K/uL                    4.5-10.5   Red Cell Count            4.61 Mil/uL  4.22-5.81   Hemoglobin                14.2 g/dL                   16.1-09.6   Hematocrit                41.2 %                      39.0-52.0   MCV                       89.4 fl                     78.0-100.0   Platelet Count            188.0 K/uL                  150.0-400.0   Neutrophil %              59.2 %                      43.0-77.0   Lymphocyte %              27.0 %                      12.0-46.0   Monocyte %                11.5 %                      3.0-12.0   Eosinophils%              1.9 %                       0.0-5.0   Basophils %               0.4 %                       0.0-3.0   TSH (TSH)   FastTSH                   2.05 uIU/mL                 0.35-5.50  Hemoglobin A1C (A1C)   Hemoglobin A1C       [H]  6.7 %                       4.6-6.5   Impression & Recommendations:  Problem # 1:  Hx of BRONCHITIS (ICD-490) He has AR, hx sinusitis, hx bronchitis, now ex-smoker>  all better since he quit smoking!!!  f/u CXR is OK> encouraged to stay off cigs. The following medications were removed from the medication list:    Zithromax Z-pak 250 Mg Tabs (Azithromycin) .Marland Kitchen... Take as directed His updated medication list for this problem includes:    Mucinex Dm 30-600 Mg Xr12h-tab (Dextromethorphan-guaifenesin) .Marland Kitchen... 1 to 2 tabs every 12 hours as needed  Orders: T-2 View CXR (71020TC)  Problem # 2:  HYPERTENSION (ICD-401.9) Controlled>  same  meds... he may need to decr the HCTZ... His updated medication list for this problem includes:    Toprol Xl 50 Mg Xr24h-tab (Metoprolol succinate) .Marland Kitchen... Take 1/2 tab by mouth once daily  Norvasc 10 Mg Tabs (Amlodipine besylate) .Marland Kitchen... Take 1 tablet by mouth once a day    Hyzaar 100-25 Mg Tabs (Losartan potassium-hctz) .Marland Kitchen... Take 1 tab by mouth once daily in the am...  Orders: T-2 View CXR (71020TC)  Problem # 3:  CORONARY ARTERY DISEASE (ICD-414.00) Followed by [facility], DrWeintraub & stable... His updated medication list for this problem includes:    Bayer Low Strength 81 Mg Tbec (Aspirin) .Marland Kitchen... Take 1 tablet by mouth once a day    Toprol Xl 50 Mg Xr24h-tab (Metoprolol succinate) .Marland Kitchen... Take 1/2 tab by mouth once daily    Norvasc 10 Mg Tabs (Amlodipine besylate) .Marland Kitchen... Take 1 tablet by mouth once a day    Hyzaar 100-25 Mg Tabs (Losartan potassium-hctz) .Marland Kitchen... Take 1 tab by mouth once daily in the am...  Problem # 4:  CEREBROVASCULAR DISEASE (ICD-437.9) Remains on ASA daily... no cerebral ischemic symptoms...  Problem # 5:  HYPERLIPIDEMIA (ICD-272.4) Chol is OK on this med but TG sl up & HDL sl low>  advised better low fat diet & incr the exercise program... His updated medication list for this problem includes:    Zocor 80 Mg Tabs (Simvastatin) .Marland Kitchen... Take 1/2 by mouth at bedtime    Zetia 10 Mg Tabs (Ezetimibe) .Marland Kitchen... Take 1 tablet by mouth once a day  Problem # 6:  DIABETES MELLITUS, BORDERLINE (ICD-790.29) A1c is 6.7.Marland KitchenMarland Kitchen needs low carb & get wt down to avoid DM meds in the future...  Problem # 7:  ADENOCARCINOMA, PROSTATE (ICD-185) Followed by DrWrenn now...  Problem # 8:  OTHER MEDICAL PROBLEMS AS NOTED>>> He'll get the flu shot at work...  Complete Medication List: 1)  Mucinex Dm 30-600 Mg Xr12h-tab (Dextromethorphan-guaifenesin) .Marland Kitchen.. 1 to 2 tabs every 12 hours as needed 2)  Bayer Low Strength 81 Mg Tbec (Aspirin) .... Take 1 tablet by mouth once a day 3)  Toprol Xl 50 Mg Xr24h-tab  (Metoprolol succinate) .... Take 1/2 tab by mouth once daily 4)  Norvasc 10 Mg Tabs (Amlodipine besylate) .... Take 1 tablet by mouth once a day 5)  Hyzaar 100-25 Mg Tabs (Losartan potassium-hctz) .... Take 1 tab by mouth once daily in the am... 6)  Klor-con M20 20 Meq Cr-tabs (Potassium chloride crys cr) .... Take 1 tablet by mouth once a day 7)  Zocor 80 Mg Tabs (Simvastatin) .... Take 1/2 by mouth at bedtime 8)  Zetia 10 Mg Tabs (Ezetimibe) .... Take 1 tablet by mouth once a day 9)  Fish Oil 1000 Mg Caps (Omega-3 fatty acids) .... Take 1 tablet by mouth once a day 10)  Vitamin C 500 Mg Tabs (Ascorbic acid) .... Take 1 tablet by mouth once a day 11)  Vitamin D 1000 Unit Tabs (Cholecalciferol) .... Take 1 tablet by mouth once a day 12)  Vitamin E 400 Unit Caps (Vitamin e) .... Take 1 capsule by mouth once a day 13)  Ambien 10 Mg Tabs (Zolpidem tartrate) .... Take 1 tab by mouth at bedtime as needed for sleep...  Patient Instructions: 1)  Today we updated your med list- see below.... 2)  Continue your current meds the same... 3)  For your CONSTIPATION: try the MIRALAX powder- mix one capful in water & take each AM, and the SENAKOT-S take 1-2 tabs at bedtime.Marland KitchenMarland Kitchen 4)  We discussed the "White Noise" machine to help block the 'tinnitus"...  5)  Today we did your follow up CXR.Marland KitchenMarland Kitchen 6)  Please return to our lab in the AM for your  FASTING blood work... then please call the "phone tree" in a few days for your lab results.Marland KitchenMarland Kitchen 7)  Remeber to get your 2011 Flu vaccine at work... 8)  Call for any problems.Marland KitchenMarland Kitchen

## 2010-06-29 NOTE — Assessment & Plan Note (Signed)
Summary: flu shot/ mbw   Nurse Visit   Allergies: No Known Drug Allergies  Orders Added: 1)  Flu Vaccine 81yrs + MEDICARE PATIENTS [Q2039] 2)  Administration Flu vaccine - MCR [G0008] Flu Vaccine Consent Questions     Do you have a history of severe allergic reactions to this vaccine? no    Any prior history of allergic reactions to egg and/or gelatin? no    Do you have a sensitivity to the preservative Thimersol? no    Do you have a past history of Guillan-Barre Syndrome? no    Do you currently have an acute febrile illness? no    Have you ever had a severe reaction to latex? no    Vaccine information given and explained to patient? yes    Are you currently pregnant? no    Lot Number:[id number]   Exp Date:11/25/2010   Site Given  Left Deltoid IM    Tammy Abdulai Blaylock  March 17, 2010 11:56 AM

## 2010-06-29 NOTE — Progress Notes (Signed)
Summary: medication questions about hyzaar, klor con and hctz  Phone Note Call from Patient Call back at [facility] Phone [phone]   Caller: Patient Call For: nadel Reason for Call: Talk to Nurse Summary of Call: Has questions about all his meds. Initial call taken by: Darletta Moll,  December 26, 2009 3:09 PM  Follow-up for Phone Call        called spoke with patient who requested that i go through all meds with him.  when we got to the hyzaar and klor con, pt thought the losartan potassium and potassium chloride were one and the same.  i clarified to pt that they are not.  pt also states that when he requested a refill on the hctz from [facility], it was denied stating that he is taking too much.  i informed pt that his hyzaar contains hctz, hence the hyphenated dosing 100-25mg .  pt verbalized his understanding.  encouraged pt to call with any more questions/concerns.  pt verbalized his understanding. Follow-up by: Boone Master CNA/MA,  December 26, 2009 4:22 PM

## 2010-06-29 NOTE — Assessment & Plan Note (Signed)
Summary: 3 months/apc   CC:  10 month ROV & review of mult medical problems....  History of Present Illness: 77 y/o WM here for a follow up visit...   ~  Apr10:   I last saw him Oct07 for a yearly follow up at that time- see problem list below... states he's doing well- no complaints... he states he quit smoking 2/29/10, and has been exercising- walking for 45 min 3x per week... he has noted a cough on the Lotensin therapy & we will need to change this med... he is also c/o some insomnia and wants a sleeping pill...   ~  July 12, 2009:  his BP was elevated & DrWeintraub placed him on HCT 25mg  +KCl daily... he states that DrRW did his Chol check as well & the pt reports that it was "OK" on Simva40 + Zetia10... he had the seasonal flu vaccine in 2010 & he had a follow up colonoscopy by DrGessner- no recurrent polyps... he has had several visits w/ DrBates for ENT- following his earaches & hearing...    Current Problems:   ALLERGIC RHINITIS (ICD-477.9) - spring allergies are a problem w/ all the pollen... he uses OTC antihistamines + nasal saline, FLONASE, Mucinex, etc...  Hx of SINUSITIS (ICD-473.9) - prev hx of maxillary sinusitis treated w/ antibiotics, nasal saline, etc... he has finally quit smoking... MRI Brain 5/06 showed complete filling of the right max sinus w/ fluid... he is followed by [facility] for E[facility]...  ? of SLEEP APNEA (ICD-780.57) - DrWeintraub's notes indicate remote hx of sleep apnea but we don't have any records of prev sleep study (done at [facility]- drKelly)... notes some snoring complaints but no daytime hypersomnolence etc... he refused to wear CPAP although DrRW wanted him on this Rx.  Hx of BRONCHITIS (ICD-490) - occas bronchial infections treated w/ antibiotics... baseline CXR w/ calcif in Ao & prom fat pad at LV apex, clear lungs... can't find prev PFT's...  ~  CXR 4/10 showed calcif in Ao, clear lungs, NAD...  CIGARETTE SMOKER (ICD-305.1) - he tells me that he quit  smoking 07/25/08 (he's a leap year baby)...  HYPERTENSION (ICD-401.9) - on TOPROL XL 50mg /d, NORVASC 10mg /d, COZAAR 100mg /d, & HCTZ 25mg /d + KCl 61mEq/d... BP= 124/52 and similar at home... denies HA, fatigue, visual changes, CP, palipit, dizziness, syncope, dyspnea, edema, etc...   CORONARY ARTERY DISEASE (ICD-414.00) - on ASA 81mg /d... followed by DrWeintraub w/ known CAD & PCI to RCA & CIRC in 1994 & 1995...  ~  2DEcho 12/08 showed norm LV & mild AoV sclerosis; & cardiolite was neg for ischemia...  ~  2DEcho 6/10 showed mild conc LVH, norm LVF w/ EF= 55%, norm wall motion.  ~  f/u DrRW 12/10 & HCTZ incr to 25mg /d, and ?Cozaar incr to 150mg /d (which he never did)...  ~  Myoview 1/11 showed scar present, no ischemia, +ectopy, no change from prev...  CEREBROVASCULAR DISEASE (ICD-437.9) - prev MRI Brain 5/06 showed atrophy and small vessel disease + mild intracranial atherosclerotic changes on MRA... on ASA 81mg /d...  HYPERLIPIDEMIA (ICD-272.4) - on ZOCOR 40mg /d + ZETIA 10mg /d w/ labs checked by DrWeintraub according to the pt....  ~  FLP 4/10 here on Simva80 showed TChol 163, TG 120, HDL 41, LDL 99  DIABETES MELLITUS, BORDERLINE (ICD-790.29) - he is overweight- 205# (stable x 96yrs) w/ 5\' 10"  frame = BMI 30... BS values have been in the 125 - 130 range... we discussed low carb/ no sweets diet...  ~  labs  4/10 showed BS= 112, HgA1c= 6.2  GERD (ICD-530.81)  IRRITABLE BOWEL SYNDROME (ICD-564.1) COLONIC POLYPS (ICD-211.3) - he had a follow up colonoscopy by DrGessner 5/10 that showed divertics, ?radiation proctitis, hems, & no recurrent polyps.  BENIGN PROSTATIC HYPERTROPHY, WITH OBSTRUCTION (ICD-600.01) - s/p TURP 6/05 & path benign. ADENOCARCINOMA, PROSTATE (ICD-185) - followed by [id number] w/ prostate Ca diagnosed in 11/09 w/ PSA= 5.58... s/p XRT by DrManning completed 4/09, and then radium seed implants... he continues to follow up w/ DrDavis Q66mo but we don't have any of his notes.  LOW BACK  PAIN SYNDROME (ICD-724.2) - hx lumbar laminectomy in the past...   Allergies (verified): No Known Drug Allergies  Past History:  Past Medical History:  ALLERGIC RHINITIS (ICD-477.9) Hx of SINUSITIS (ICD-473.9) ? of SLEEP APNEA (ICD-780.57) Hx of BRONCHITIS (ICD-490) CIGARETTE SMOKER (ICD-305.1) HYPERTENSION (ICD-401.9) CORONARY ARTERY DISEASE (ICD-414.00) CEREBROVASCULAR DISEASE (ICD-437.9) HYPERLIPIDEMIA (ICD-272.4) DIABETES MELLITUS, BORDERLINE (ICD-790.29) GERD (ICD-530.81) IRRITABLE BOWEL SYNDROME (ICD-564.1) COLONIC POLYPS (ICD-211.3) BENIGN PROSTATIC HYPERTROPHY, WITH OBSTRUCTION (ICD-600.01) ADENOCARCINOMA, PROSTATE (ICD-185) LOW BACK PAIN SYNDROME (ICD-724.2)  Past Surgical History: S/P appendectomy S/P lumbar laminectomy S/P TURP 6/05 by [id number] S/P prostate bx w/ adenoCa 11/08 w/ subseq XRT & seed implants  Family History: Reviewed history from 04/13/2009 and no changes required. positive for heart diease  Social History: Reviewed history from 04/13/2009 and no changes required. married former smoker- x55-43yrs 3/4ppd--quit smoking on 2-29-2010 occ alcohol works at [employer]  Review of Systems      See HPI       The patient complains of dyspnea on exertion.  The patient denies anorexia, fever, weight loss, weight gain, vision loss, decreased hearing, hoarseness, chest pain, syncope, peripheral edema, prolonged cough, headaches, hemoptysis, abdominal pain, melena, hematochezia, severe indigestion/heartburn, hematuria, incontinence, muscle weakness, suspicious skin lesions, transient blindness, difficulty walking, depression, unusual weight change, abnormal bleeding, enlarged lymph nodes, and angioedema.    Vital Signs:  Patient profile:   77 year old male Height:      69.5 inches Weight:      204.50 pounds O2 Sat:      97 % on Room air Temp:     97.2 degrees F oral Pulse rate:   38 / minute BP sitting:   124 / 52  (left arm) Cuff size:    regular  Vitals Entered By: Randell Loop CMA (July 12, 2009 10:20 AM)  O2 Sat at Rest %:  97 O2 Flow:  Room air CC: 10 month ROV & review of mult medical problems... Comments meds updated today   Physical Exam  Additional Exam:  WD, WN, 77 y/o WM in NAD... GENERAL:  Alert & oriented; pleasant & cooperative... HEENT:  [place]/AT, EOM-wnl, PERRLA, EACs-clear, TMs-wnl, NOSE-clear, THROAT-clear & wnl. NECK:  Supple w/ fairROM; no JVD; normal carotid impulses w/o bruits; no thyromegaly or nodules palpated; no lymphadenopathy. CHEST:  Clear to P & A; without wheezes/ rales/ or rhonchi. HEART:  Regular Rhythm; without murmurs/ rubs/ or gallops. ABDOMEN:  Soft & nontender; normal bowel sounds; no organomegaly or masses detected. RECTAL:  dererred to [facility] EXT: without deformities or arthritic changes; no varicose veins/ venous insuffic/ or edema. NEURO:  CN's intact; motor testing normal; sensory testing normal; gait normal & balance OK. DERM:  No lesions noted; no rash etc...    MISC. Report  Procedure date:  07/12/2009  Findings:      Data reviewed:  Office note by DrWeintraub 05/19/09... Myoview & 2DEcho...   Colonoscopy by DrGessner 5/10.Marland KitchenMarland Kitchen  SN   Impression & Recommendations:  Problem # 1:  Hx of SINUSITIS (ICD-473.9) Sinus & ear problems evaluated & treated by DrBates... continue his Rx. His updated medication list for this problem includes:    Mucinex Dm 30-600 Mg Xr12h-tab (Dextromethorphan-guaifenesin) .Marland Kitchen... 1 to 2 tabs every 12 hours as needed    Zithromax Z-pak 250 Mg Tabs (Azithromycin) .Marland Kitchen... Take as directed  Problem # 2:  HYPERTENSION (ICD-401.9) BP controlled-  we decided to change the Cozaar + HCTZ into HYZAAR... The following medications were removed from the medication list:    Cozaar 100 Mg Tabs (Losartan potassium) .Marland Kitchen... Take 1 and 1/2 tabs daily    Hydrochlorothiazide 12.5 Mg Caps (Hydrochlorothiazide) .Marland Kitchen... Take 1 tablet by mouth two times a day His  updated medication list for this problem includes:    Toprol Xl 50 Mg Xr24h-tab (Metoprolol succinate) .Marland Kitchen... Take 1/2 tab by mouth once daily    Norvasc 10 Mg Tabs (Amlodipine besylate) .Marland Kitchen... Take 1 tablet by mouth once a day    Hyzaar 100-25 Mg Tabs (Losartan potassium-hctz) .Marland Kitchen... Take 1 tab by mouth once daily in the am...  Problem # 3:  CORONARY ARTERY DISEASE (ICD-414.00) Followed by DrRW... continue Rx... The following medications were removed from the medication list:    Cozaar 100 Mg Tabs (Losartan potassium) .Marland Kitchen... Take 1 and 1/2 tabs daily    Hydrochlorothiazide 12.5 Mg Caps (Hydrochlorothiazide) .Marland Kitchen... Take 1 tablet by mouth two times a day His updated medication list for this problem includes:    Bayer Low Strength 81 Mg Tbec (Aspirin) .Marland Kitchen... Take 1 tablet by mouth once a day    Toprol Xl 50 Mg Xr24h-tab (Metoprolol succinate) .Marland Kitchen... Take 1/2 tab by mouth once daily    Norvasc 10 Mg Tabs (Amlodipine besylate) .Marland Kitchen... Take 1 tablet by mouth once a day    Hyzaar 100-25 Mg Tabs (Losartan potassium-hctz) .Marland Kitchen... Take 1 tab by mouth once daily in the am...  Problem # 4:  HYPERLIPIDEMIA (ICD-272.4) He is continuing on this Rx and had labs per DrRW he says... His updated medication list for this problem includes:    Zocor 80 Mg Tabs (Simvastatin) .Marland Kitchen... Take 1/2 by mouth at bedtime    Zetia 10 Mg Tabs (Ezetimibe) .Marland Kitchen... Take 1 tablet by mouth once a day  Problem # 5:  COLONIC POLYPS (ICD-211.3) Colonoscopy by DrGessner as noted...  Problem # 6:  OTHER MEDICAL PROBLEMS AS NOTED>>>  Complete Medication List: 1)  Mucinex Dm 30-600 Mg Xr12h-tab (Dextromethorphan-guaifenesin) .Marland Kitchen.. 1 to 2 tabs every 12 hours as needed 2)  Bayer Low Strength 81 Mg Tbec (Aspirin) .... Take 1 tablet by mouth once a day 3)  Toprol Xl 50 Mg Xr24h-tab (Metoprolol succinate) .... Take 1/2 tab by mouth once daily 4)  Norvasc 10 Mg Tabs (Amlodipine besylate) .... Take 1 tablet by mouth once a day 5)  Hyzaar 100-25 Mg Tabs  (Losartan potassium-hctz) .... Take 1 tab by mouth once daily in the am... 6)  Klor-con M20 20 Meq Cr-tabs (Potassium chloride crys cr) .... Take 1 tablet by mouth once a day 7)  Zocor 80 Mg Tabs (Simvastatin) .... Take 1/2 by mouth at bedtime 8)  Zetia 10 Mg Tabs (Ezetimibe) .... Take 1 tablet by mouth once a day 9)  Vitamin C 500 Mg Tabs (Ascorbic acid) .... Take 1 tablet by mouth once a day 10)  Vitamin D 1000 Unit Tabs (Cholecalciferol) .... Take 1 tablet by mouth once a day 11)  Vitamin  E 400 Unit Caps (Vitamin e) .... Take 1 capsule by mouth once a day 12)  Ambien 10 Mg Tabs (Zolpidem tartrate) .... Take 1 tab by mouth at bedtime as needed for sleep... 13)  Zithromax Z-pak 250 Mg Tabs (Azithromycin) .... Take as directed  Other Orders: Prescription Created Electronically [id number])  Patient Instructions: 1)  Today we updated your med list- see below.... 2)  We combined the Cozaar (Losartan) w/ the Hydrochlorthiazide (HCTZ) into one HYZAAR tab daily.Marland KitchenMarland Kitchen 3)  Continue the KCl from DrWeintraub.Marland KitchenMarland Kitchen 4)  Call for any problems.Marland KitchenMarland Kitchen 5)  Please schedule a follow-up appointment in 6 months. Prescriptions: HYZAAR 100-25 MG TABS (LOSARTAN POTASSIUM-HCTZ) take 1 tab by mouth once daily in the AM...  #90 x 4   Entered and Authorized by:   Michele Mcalpine MD   Signed by:   Michele Mcalpine MD on 07/12/2009   Method used:   Print then Give to Patient   RxID:   [id number]

## 2010-10-02 ENCOUNTER — Ambulatory Visit: Payer: Self-pay | Admitting: Internal Medicine

## 2010-10-10 NOTE — Op Note (Signed)
NAME:  Dillon Young, HAZE NO.:  [id number]   MEDICAL RECORD NO.:  [id number]          PATIENT TYPE:  REC   LOCATION:  [facility]                         FACILITY:  [facility]   PHYSICIAN:  Ronald L. Earlene Plater, M.D.  DATE OF BIRTH:  [birth year]   DATE OF PROCEDURE:  10/03/2007  DATE OF DISCHARGE:                               OPERATIVE REPORT   DIAGNOSIS:  Adenocarcinoma of prostate.   OPERATIVE PROCEDURE:  Robotic arm Nucletron iodine 125 seed implantation  and flexible cystourethroscopy.   SURGEON:  Lucrezia Starch. Earlene Plater, M.D.   ASSISTANT:  Artist Pais. Kathrynn Running, M.D.   ANESTHESIA:  LMA.   BLOOD LOSS:  Negligible.   TUBE:  A 16-French Foley.   COMPLICATIONS:  None.   A total of 20 needles were used to implant 58 seeds iodine 125 at  19.1980 total apparent activity.  One seed was removed  cystoscopically.  Postoperative believe a total of 57 seeds.   INDICATIONS FOR PROCEDURE:  Mr. Carchi is a very nice 77 year old white  male.  He has a history of TURP and presented with an elevated PSA of  5.58 with a free to flow ratio of 10.9% and subsequently went ultrasound  and biopsy of the prostate which revealed multifocal disease, Gleason  score 6 and Gleason score 7 which was 3+4 for and the 6 was 3+3.  After  understanding risks, benefits and alternatives, he has elected to  undergo combination radiotherapy.  He has completed his IMRT and asked  radioactive seed implantation with iodine 125.   DESCRIPTION OF PROCEDURE:  The patient was placed in supine position.  After proper LMA anesthesia, was placed in the dorsal lithotomy position  and prepped and draped with Betadine in sterile fashion.  A 16-French  Foley catheter was inserted and the balloon was inflated and the bladder  drained.  The B&K transrectal ultrasound probe was placed on its frame  and utilizing both axial and sagittal scanning, three-dimensional  prostatic mottling was performed.  Intraoperative dosimetry planning  was  then performed.  Dosimetry clouds and dose volume histogram constructed  to create proper dosing to the prostate, minimizing doses to the urethra  and the rectum as noted in the record.  Following this, two holding  stitches were placed in unused coordinates and utilizing both the  physical electronic grid, an initial needle was placed to set the base  and seed implantation was then performed.  A total of 22 needles were  utilized to implant 58 iodine125 seeds with a total apparent activity  19.1980 mCi.  Following implantation, static images were obtained.  We  were comfortable with seed location.  The Foley catheter was removed and  scanned and there were no seeds within it.  The patient was placed in  supine position after the wound had been dressed sterilely and all  equipment had been removed and flexible cystourethroscopy was performed.  The prostatic fossa was noted to be open.  The bladder was smooth-  walled.  Efflux of clear urine was noted from normally placed ureteral  orifices bilaterally.  In the 3 o'clock position  in the prostatic fossa,  there appeared to be a tangential stick and a spacer was removed along  with one radioactive seed.  No other seeds were noted to be present.  Good hemostasis was noted to be present.  The seed was submitted to  radiotherapy.  The flexible cystourethroscope was visually removed and  the 16 French catheter was inserted into the bladder.  The bladder was  drained and the patient was taken to the recovery room stable.      Ronald L. Earlene Plater, M.D.  Electronically Signed     RLD/MEDQ  D:  10/03/2007  T:  10/03/2007  Job:  [id number]

## 2010-10-13 NOTE — H&P (Signed)
NAME:  Dillon Young, Dillon Young                        ACCOUNT NO.:  [id number]   MEDICAL RECORD NO.:  [id number]                   PATIENT TYPE:  INP   LOCATION:  0005                                 FACILITY:  [facility]   PHYSICIAN:  Lucrezia Starch. Earlene Plater, M.D.               DATE OF BIRTH:  01-Jul-1932   DATE OF ADMISSION:  11/05/2003  DATE OF DISCHARGE:                                HISTORY & PHYSICAL   CHIEF COMPLAINT:  Bladder outlet obstruction secondary to BPH.   HISTORY OF PRESENT ILLNESS:  Dillon Young is a 77 year old male with a history  of bladder outlet obstruction requiring medical therapy with Flomax.  In  addition, the patient has a history of gross hematuria, which on hematuria  evaluation was found to most likely be coming from his prostate.  The  patient's hematuria evaluation was otherwise negative for any signs of tumor  or stone.  A long discussion was held with Dillon Young concerning possible  treatments for his BPH, along with prostate stones, and the patient has  elected to proceed with transurethral resection of his prostate.   REVIEW OF SYSTEMS:  No fevers, chills, nausea, vomiting, shortness of  breath, diarrhea, or constipation.  All other systems are negative, except  as above.   PAST MEDICAL HISTORY:  1. Significant for BPH.  2. History of nephrolithiasis.  3. Hypertension.   PAST SURGICAL HISTORY:  1. Status post back surgery.  2. Status post appendectomy.  3. Status post tonsillectomy.   ALLERGIES:  No known drug allergies.   MEDICATIONS:  1. Zocor.  2. Toprol.  3. Lotensin.  4. Norvasc.  5. Protonix.  6. Flomax.  7. Aspirin.  8. Vitamin E.  9. Vitamin C.   SOCIAL HISTORY:  The patient reports having an occasional drink of alcohol.  The patient is also a heavy smoker.  The patient reports that he smokes less  than a pack a day, and has been doing so for greater than 50 years.  The  patient lives at home.   FAMILY HISTORY:  No known history of GU  malignancy.   PHYSICAL EXAMINATION:  GENERAL:  The patient is in no acute distress.  Alert  and oriented to person, place, and time.  HEENT:  Pupils are equal, round and reactive to light.  Extraocular  movements intact.  NECK:  No masses.  HEART:  Regular rate and rhythm without murmurs.  LUNGS:  Clear to auscultation bilaterally.  ABDOMEN:  Soft, nontender, nondistended.  Bowel sounds positive.  EXTREMITIES:  No clubbing, cyanosis, or edema.  NEUROLOGIC:  Cranial nerves II-XII grossly intact.  SKIN:  No lesions.  GU:  Normal circumcised penis with no plaques or masses.  Testicles  descended bilaterally.  No testicular masses or other abnormalities noted.   IMPRESSION:  A 77 year old male with bladder outlet obstruction secondary to  BPH, as well as gross hematuria secondary to an enlarged  prostate with  multiple prostate stones.   PLAN:  We will plan to taken Dillon Young to the operating room, at which time  he will undergo a transurethral resection of his prostate.     Bailey Mech, M.D.                      Ronald L. Earlene Plater, M.D.    JP/MEDQ  D:  11/05/2003  T:  11/05/2003  Job:  [id number]

## 2010-10-13 NOTE — H&P (Signed)
[place]. [facility]  Patient:    Dillon Young, Dillon Young                       MRN: [id number] Adm. Date:  09/16/00 Attending:  Avie Echevaria, M.D.                         History and Physical  HISTORY OF PRESENT ILLNESS:  Mr. Dillon Young is a 77 year old man with low back pain radiating to the left groin and anterior thigh associated with decreased sensation in the anterior thigh and upper part of the anterior knee, decreased reflexes, positive femoral stretch and MRI showing L3-4 foraminal HNP and L4-5 left sided HNP, both associated with relative stenosis.  The patient has been followed in this office since July 24, 2000. It seems that his problem had begun about three weeks prior to that. We have tried him on exercises and Celebrex. He became worse. The pain 1:10 was rated at 10. Subsequently, we obtained an MRI showing the above noted changes and without significant improvement, we feel that surgery was indicated. We discussed that with him, the fact that there are no guarantees and it is basically for leg pain and he would like to proceed on with same.  ALLERGIES:  No known drug allergies.  CURRENT MEDICATIONS: 1. Zocor. 2. Toprol. 3. Lotensin. 4. Aspirin. 5. Hytrin.  PAST MEDICAL HISTORY: 1. Angioplasty in 1995. 2. Kidney stones. 3. T&A. 4. Pilonidal cyst.  REVIEW OF SYSTEMS:  He smokes 1 pack of cigarettes per day. He very recently was evaluated by Dr. Darvin Neighbours and told that his prostate was quite normal.  FAMILY HISTORY:  Positive for heart disease and high blood pressure.  PHYSICAL EXAMINATION:  VITAL SIGNS:  Height 5 feet 9 inches and weighs 197 pounds, blood pressure 136/66, temperature 97.3, pulse 60 and respirations 16.  HEENT:  He is wearing corrective lenses. Extraocular movements are full. Tympanic membranes are partially occluded with cerumen. Pharynx is clear. There are no dentures.  NECK:  Neck moves well without any  discomfort. No palpable masses. I did not hear carotid bruit.  CARDIOVASCULAR:  Regular rhythm, no murmurs heard.  LUNGS:  Volume is okay. Breath sounds are okay.  ABDOMEN:  Soft, no palpable masses, no tenderness, bowel sounds are active.  ORTHOPEDIC:  Evaluation discloses a left femoral bruit, intact left dorsalis pedis pulses. All his reflexes are depressed. He can toe walk, heel walk, stand up on a stool with apparently good strength. The left anterior thigh to just below the knee has decreased touch sensation.  LABORATORY AND ACCESSORY DATA:  MRI has multiple changes as noted above.  IMPRESSION: 1. L3-4 herniated nucleus pulposus foraminal on the left. 2. L4-5 herniated nucleus pulposus central and to the left and both    levels associated with thickened facets and ligamentum flavum    causing some stenosis. 3. Atherosclerotic cardiovascular disease. 4. Cigarette smoker.  PLAN:  The L4 laminectomy and decompression at L3-4 and L4-5 were fully discussed with the patient. The surgery is intended to help with the lower extremity pain. It probably will not change the back pain, that there are no guarantees. I would expect that he will do fairly well. I have written a prescription for Tylox to be used postoperatively. DD:  09/16/00 TD:  09/16/00 Job: [id number]

## 2010-10-13 NOTE — Op Note (Signed)
[place]  Patient:    Dillon Young, Dillon Young Visit Number: [id number] MRN: [id number]          Service Type: DSU Location: [facility] 2899 12 Attending Physician:  Maryanna Shape Proc. Date: 09/17/00 Admit Date:  09/17/2000                             Operative Report  PREOPERATIVE DIAGNOSIS: Herniated nucleus pulposus and stenosis at L3-4 and L4-5 on the left.  POSTOPERATIVE DIAGNOSIS: Herniated nucleus pulposus and stenosis at L3-4 and L4-5 on the left.  OPERATION/PROCEDURE: Hemilaminectomy and decompression of L3-4 and L4-5 centrally and laterally with foraminotomies plus microdiskectomy at each level.  SURGEON: Reynolds Bowl, M.D.  ASSISTANT: Humberto Leep. Wyonia Hough, M.D.  ANESTHESIA: General.  DESCRIPTION OF PROCEDURE: The patient was given general anesthetic and was given 2 g of Ancef.  He was placed on the operative table and prepped and draped in the usual manner for sterility.  Interspaces were identified using needles and x-ray and based on that we made an incision that would allow decompression of L3-4 and L4-5.  This was carried down to the lumbar fascia. Subcutaneous bleeders were Bovie cauterized.  The lumbar fascia was incised and paraspinous muscles subperiosteally swept laterally.  We put in a McCullough retractor and one Cytogeneticist and confirmed where L4-5 and L3-4 was, and then carefully removed the left lamina of L4, the intervening 4-5 and 3-4 ligamentum flavum, approximately half of the lower part of the L3 lamina, and some of the upper border of the L5 lamina, and carefully dissected out laterally to the level of the pedicles, and removed tight overhanging bone in that level medially, leaving laterally intact.  At the 4-5 level on the pedicle and nerve root a tight herniated disk and large plexus of veins were noted and these were carefully dissected.  Did not use Bovie, and then accomplished diskectomy and  decompression of this nerve.  This also required foraminotomy, removing some osteophytic facet until the nerve root was free. Attention was directed at the L3-4 level and likewise there was a fairly large knuckle of disk laterally and a tight nerve root.  This disk material was outside the disk proper and it was in somewhat of a sac.  We completed a diskectomy at this level and 4-5 level.  Rechecked nerve roots again and they were lying free.  Re-irrigated and removed the instruments.  Estimated blood loss was maybe 200 cc.  There was a slow ooze throughout the procedure.  The lumbar fascia was approximated using figure-of-eight sutures of #1 Vicryl. The subcutaneous tissue was approximated somewhat loosely likewise with 2-0 Vicryl and skin edges held in that position using metal staples.  He was returned to the recovery room and in the recovery room he could dorsiflex and plantar flex his feet well as well as flex and extend his knees. Attending Physician:  Maryanna Shape DD:  09/17/00 TD:  09/18/00 Job: [id number]

## 2010-10-13 NOTE — Discharge Summary (Signed)
NAME:  Dillon Young, Dillon Young                        ACCOUNT NO.:  [id number]   MEDICAL RECORD NO.:  [id number]                   PATIENT TYPE:  INP   LOCATION:  0375                                 FACILITY:  [facility]   PHYSICIAN:  Lucrezia Starch. Ovidio Hanger, M.D.           DATE OF BIRTH:  04/09/1933   DATE OF ADMISSION:  11/05/2003  DATE OF DISCHARGE:  11/07/2003                                 DISCHARGE SUMMARY   DISCHARGE DIAGNOSES:  Bladder outflow obstruction secondary to benign  prostatic hypertrophy.   OPERATIVE PROCEDURE:  Cysto TURP on November 05, 2003.   HISTORY:  Mr. Alvizo is a very nice 77 year old white male with a history of  bladder outflow obstruction on Flomax.  He has a history of gross hematuria  and had progressive symptomatology. After understanding the risks, benefits,  and alternatives, he elected to proceed with Cysto TURP.   Past medical history, social history, family history and review of systems  please see signed patient medical history sheet.   PHYSICAL EXAMINATION:  VITAL SIGNS:  He was afebrile, vital signs stable.  GENERAL:  Well-developed, well-nourished in no acute distress, oriented x3.  HEENT:  Normal.  NECK:  Without mass or thyromegaly.  CHEST:  Normal diaphragmatic motion.  HEART:  Normal sinus rhythm without murmurs or gallops.  ABDOMEN:  Soft, nontender without masses or organomegaly. Bowel sounds were  normal.  EXTREMITIES:  Normal.  NEUROLOGIC:  Intact.  SKIN:  Normal.  GU:  Penis, meatus, scrotum, testicle, adnexa, __________ prostate is  approximately 30 g, smooth, symmetrical and benign.   HOSPITAL COURSE:  The patient was admitted and after undergoing preoperative  evaluation was subsequently taken to surgery on November 05, 2003 and underwent  Cysto TURP uneventfully.  Immediately postoperatively he was doing well and  his urine was bloody but clearing.  On postoperative day one, November 06, 2003,  he continued to improve.  His postoperative  laboratory evaluation was okay.  He subsequently progressively improved, tolerated a diet.  Foley catheter  was removed on November 07, 2003. He tolerated that well and was discharged.   DISCHARGE MEDICATIONS:  Cipro. He was to followup and see me in two weeks.   FINAL PATHOLOGY:  Benign prostatic tissue with foci of inflammation.                                               Ronald L. Ovidio Hanger, M.D.    RLD/MEDQ  D:  12/16/2003  T:  12/17/2003  Job:  [id number]

## 2010-10-13 NOTE — Op Note (Signed)
NAME:  Dillon Young, Dillon Young                        ACCOUNT NO.:  [id number]   MEDICAL RECORD NO.:  [id number]                   PATIENT TYPE:  INP   LOCATION:  0005                                 FACILITY:  [facility]   PHYSICIAN:  Lucrezia Starch. Earlene Plater, M.D.               DATE OF BIRTH:  25-Nov-1932   DATE OF PROCEDURE:  11/05/2003  DATE OF DISCHARGE:                                 OPERATIVE REPORT   PREOPERATIVE DIAGNOSIS:  Bladder outlet obstruction secondary to benign  prostatic hypertrophy.  History of gross hematuria secondary to benign  prostatic hypertrophy and presence of multiple prostate calculi.   POSTOPERATIVE DIAGNOSIS:  Bladder outlet obstruction secondary to benign  prostatic hypertrophy.  History of gross hematuria secondary to benign  prostatic hypertrophy and presence of multiple prostate calculi.   PROCEDURE:  Cystoscopy, transurethral resection of the prostate (TURP).   ATTENDING SURGEON:  Lucrezia Starch. Earlene Plater, M.D.   RESIDENT SURGEON:  Janae Sauce Philips, MD.   ANESTHESIA:  General endotracheal anesthesia.   COMPLICATIONS:  None.   INDICATIONS FOR PROCEDURE:  [name] is a 77 year old male with a history  of BPH treated with alpha blockers, who was evaluated in the clinic for a  history of gross hematuria.  Patient's hematuria evaluation was negative;  however, on office cysto, he was noted to have multiple stones within his  prostatic urethra, which was most likely the cause of his gross hematuria.  After discussing possible treatments for the patient's condition, it was  elected to proceed with TURP.   DESCRIPTION OF PROCEDURE:  Patient was brought to the operating room.  Following induction of general endotracheal anesthesia, he was placed in the  dorsal lithotomy position and prepped and draped in the usual sterile  fashion.  On entering the urethra, there was a narrowed area located just  distal to the external sphincter that made passage of urethral sounds as  well as the resectoscope sheath quite difficult; however, we were able to  pass the cystoscope all the way through the urethra and the bladder under  direct vision.  Once inside the bladder, the left and right ureteral  orifices were identified and efflux of clear urine confirmed from each.  The  resectoscope was subsequently pulled back into the prostatic urethra.  The  verumontanum was identified, and resection was initiated.  The medium lobe  was initially resected all the way down to the verumontanum.  Multiple  prostate stones were noted during the resection, and these were removed  along with the resected specimen.  Both lateral lobes were then resected,  followed by the anterior wall of the prostatic urethra.  Once all tissue had  been resected to satisfactory extent, hemostasis was obtained using  electrocautery, and the prostate chips irrigated out of the bladder using a  Toomey syringe.  These prostasis and chips were subsequently sent for  pathologic evaluation.  The resectoscope  subsequently removed,  and a 24 [place] three-way catheter placed to straight  drain.  At this point, the patient was allowed to awakened, and the case was  ended.  Patient tolerated the procedure well.  There were no complications.   Dr. Gaynelle Arabian was present for the entire case and participated in all  aspects of the procedure.     Thyra Breed, MD                            Lucrezia Starch. Earlene Plater, M.D.    EG/MEDQ  D:  11/05/2003  T:  11/05/2003  Job:  [id number]

## 2010-10-13 NOTE — Op Note (Signed)
NAME:  Dillon Young, LINHARES NO.:  [id number]   MEDICAL RECORD NO.:  [id number]          PATIENT TYPE:  AMB   LOCATION:  [facility]                         FACILITY:  [facility]   PHYSICIAN:  Ronald L. Earlene Plater, M.D.  DATE OF BIRTH:  03/21/1933   DATE OF PROCEDURE:  06/14/2006  DATE OF DISCHARGE:                               OPERATIVE REPORT   PREOPERATIVE DIAGNOSIS:  Bladder stone.   POSTOPERATIVE DIAGNOSIS:  Passed bladder stone.   OPERATIVE PROCEDURE:  Cystourethroscopy.   SURGEON:  Dr. Gaynelle Arabian.   ANESTHESIA:  LMA.   BLOOD LOSS:  Negligible.   TUBES:  None.   COMPLICATIONS:  None.   INDICATIONS FOR PROCEDURE:  Mr. Errico is a very nice 77 year old white  male with a history of TURP who had a left ureteral calculus.  He  subsequently passed the calculus but he developed some hematuria and  other problems. On office cystourethroscopy, he was found to have  approximately a centimeter stone embedded in the prostatic urethra. It  was felt to be the ureteral stone that it had done so. It was pushed  cystoscopically into the bladder and it was felt that EHL of the stone  was probably indicated, the total diameter was approximately a  centimeter. After  understanding the risks, benefits and alternatives,  he has elected to proceed. Of note, he did note passing a small fragment  but does not note passing any large fragment.   DESCRIPTION OF PROCEDURE:  The patient was placed in supine position and  after proper LMA anesthesia was placed in the dorsal lithotomy position,  prepped and draped with Betadine in a sterile fashion.  Cystourethroscopy was performed with a 22.5 French Olympus panendoscope,  the urethra was noted to be widely patent. The prostatic urethra was  open and nonobstructive.  The bladder was smooth wall and efflux of  clear urine was noted from the normally placed ureteral orifices  bilaterally.  There were no stones or lesions in the bladder or the  urethra. It feels that the stone either fragmented and passed in  fragments or he passed a large fragment and did not know it. The bladder  was drained, the panendoscope was removed and the patient was taken to  the recovery room stable.     Ronald L. Earlene Plater, M.D.  Electronically Signed    RLD/MEDQ  D:  06/14/2006  T:  06/14/2006  Job:  [id number]

## 2010-11-09 ENCOUNTER — Encounter: Payer: Self-pay | Admitting: Pulmonary Disease

## 2011-01-01 ENCOUNTER — Encounter: Payer: Self-pay | Admitting: Specialist

## 2011-01-22 ENCOUNTER — Other Ambulatory Visit: Payer: Self-pay | Admitting: Pulmonary Disease

## 2011-02-01 ENCOUNTER — Ambulatory Visit (INDEPENDENT_AMBULATORY_CARE_PROVIDER_SITE_OTHER): Payer: Medicare Other | Admitting: Internal Medicine

## 2011-02-01 ENCOUNTER — Encounter: Payer: Self-pay | Admitting: Internal Medicine

## 2011-02-01 VITALS — BP 128/52 | HR 52 | Ht 69.0 in | Wt 201.8 lb

## 2011-02-01 DIAGNOSIS — C61 Malignant neoplasm of prostate: Secondary | ICD-10-CM

## 2011-02-01 DIAGNOSIS — D126 Benign neoplasm of colon, unspecified: Secondary | ICD-10-CM

## 2011-02-01 DIAGNOSIS — K649 Unspecified hemorrhoids: Secondary | ICD-10-CM

## 2011-02-01 DIAGNOSIS — K5909 Other constipation: Secondary | ICD-10-CM

## 2011-02-01 DIAGNOSIS — K5900 Constipation, unspecified: Secondary | ICD-10-CM

## 2011-02-01 MED ORDER — BENEFIBER PO POWD: ORAL | Status: DC

## 2011-02-01 MED ORDER — HYDROCORTISONE 2.5 % RE CREA: TOPICAL_CREAM | Freq: Two times a day (BID) | RECTAL | Status: DC

## 2011-02-01 NOTE — Progress Notes (Signed)
  Subjective:    Patient ID: Dillon Young, male    DOB: Dec 26, 1932, 77 y.o.   MRN: [id number]  HPI This man presents with complaints of chronic recurrent hemorrhoidal troubles. He'll see some rectal bleeding and hemorrhoids will drop down her prolapse will have to reinsert them with his finger. 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 He also has constipation with somewhat infrequent stools and he has to strain to produce a stool many times. He feels bloated and distended in his abdomen his heart at times, he thinks that that's probably better if he is moving his bowels well. Itching bothers him quite a bit as well. This is a stool softener sometimes. He is tried Metamucil but felt like it didn't resolve properly. He thinks he has a good amount of fiber in his diet.  Review of Systems As above    Objective:   Physical Exam Elderly white male in no acute distress Abdomen is soft and nontender nS Rectal exam shows a small prolapsed hemorrhoid. Anoscopy is performed and demonstrates hemorrhoids are inflamed and irritated in the anal canal with some radiation proctitis present as well. There is no mass, there is formed brown stool in the rectum.       Assessment & Plan:

## 2011-02-01 NOTE — Patient Instructions (Addendum)
Hemorrhoid handout given for you to read. Start Benefiber as listed on your medication list. You may take that twice a day or 2 tablespoons in a day if needed. Start with 1 tablespoon a day. Use a hairdryer on low to dry the anal area as we discussed. Use the hydrocortisone cream which I prescribed instead of a suppository, twice a day for about 2 weeks and then you can use it as needed. This should help the hemorrhoids and the anal symptoms. It usually comes with an applicator and you can squirted into the rectal area or you can use your fingers to apply to the anal and rectal area and treat the hemorrhoids.

## 2011-02-02 ENCOUNTER — Encounter: Payer: Self-pay | Admitting: Internal Medicine

## 2011-02-02 DIAGNOSIS — K5900 Constipation, unspecified: Secondary | ICD-10-CM | POA: Insufficient documentation

## 2011-02-02 DIAGNOSIS — K5909 Other constipation: Secondary | ICD-10-CM | POA: Insufficient documentation

## 2011-02-02 DIAGNOSIS — K648 Other hemorrhoids: Secondary | ICD-10-CM | POA: Insufficient documentation

## 2011-02-02 NOTE — Assessment & Plan Note (Signed)
Evident from exam and anoscopy. Prolapse, itching and bleeding sxs.  We discussed treatment options - medical vs. Procedure/surgery. Will try hydrocortisone cream, retraining of bowel habit and fiber increase. Anal care also. REV 2 months - if failing then consider ligation or other treatment.

## 2011-02-02 NOTE — Assessment & Plan Note (Signed)
Needs to increase fiber. Benefiber to be started as well as dietary increase. Could need MiraLax. 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

## 2011-02-09 ENCOUNTER — Telehealth: Payer: Self-pay | Admitting: Internal Medicine

## 2011-02-09 MED ORDER — CALCIUM POLYCARBOPHIL 625 MG PO TABS: 625.0000 mg | ORAL_TABLET | Freq: Every day | ORAL | Status: DC

## 2011-02-09 NOTE — Telephone Encounter (Signed)
Patient called stating that the [place] [employer] is no longer on the market. Patient question what else could he take? Patient  was told that he could take Metamucil or Citracal but the patient asked is it ok to take Fibercon pills. Per Lavonna Rua, patient was told that Sena Slate is ok.

## 2011-03-08 ENCOUNTER — Telehealth: Payer: Self-pay | Admitting: Pulmonary Disease

## 2011-03-08 NOTE — Telephone Encounter (Signed)
Had to request paper chart

## 2011-03-09 NOTE — Telephone Encounter (Signed)
Looked in pt's paper chart and there is no documentation that the pt ever received a pneumovax from our office. LMOMTCB x 1

## 2011-03-09 NOTE — Telephone Encounter (Signed)
Pt is aware and has scheduled physical with SN on 12/10 and can discuss getting pneumovax at that time.

## 2011-03-28 ENCOUNTER — Ambulatory Visit: Payer: Medicare Other | Admitting: Internal Medicine

## 2011-03-30 ENCOUNTER — Ambulatory Visit (INDEPENDENT_AMBULATORY_CARE_PROVIDER_SITE_OTHER): Payer: Medicare Other | Admitting: Internal Medicine

## 2011-03-30 ENCOUNTER — Encounter: Payer: Self-pay | Admitting: Internal Medicine

## 2011-03-30 DIAGNOSIS — K649 Unspecified hemorrhoids: Secondary | ICD-10-CM

## 2011-03-30 DIAGNOSIS — K5909 Other constipation: Secondary | ICD-10-CM

## 2011-03-30 DIAGNOSIS — K5900 Constipation, unspecified: Secondary | ICD-10-CM

## 2011-03-30 DIAGNOSIS — K6289 Other specified diseases of anus and rectum: Secondary | ICD-10-CM

## 2011-03-30 DIAGNOSIS — K627 Radiation proctitis: Secondary | ICD-10-CM

## 2011-03-30 MED ORDER — HYDROCORTISONE 2.5 % RE CREA: TOPICAL_CREAM | Freq: Two times a day (BID) | RECTAL | Status: DC

## 2011-03-30 NOTE — Patient Instructions (Signed)
Your prescription(s) has(have) been sent to your pharmacy for you to pick up (Procotosol). Take fiber every day. Return to see Dr. Leone Payor in Jan. Stop at the front desk to schedule that on your way out.

## 2011-03-30 NOTE — Progress Notes (Signed)
[id number] 04/30/1933  This 77 year old white male returns for an approximately six-week followup after being seen and treated for internal hemorrhoids with prolapse and chronic constipation. He is significantly better using 2.5% hydrocortisone cream. He is also using a daily fiber supplement though he admits he is not completely compliant with that. Still with occasional prolapse and some rare rectal bleeding. Movements are more regular and there is significantly less straining.

## 2011-03-30 NOTE — Assessment & Plan Note (Signed)
This is improved. He is not completely compliant with daily fiber supplementation and understands the need to do so we'll work on that. I will see him back in January for reassessment.

## 2011-03-30 NOTE — Assessment & Plan Note (Signed)
This was seen at previous colonoscopy and could be a source of some of his bleeding. Given that, it may be most appropriate to proceed with a sigmoidoscopy in the future to consider possible ablation of this versus hemorrhoidal ligation or both. We'll discuss that with him when he returns in January and I did mention this to him today.

## 2011-03-30 NOTE — Assessment & Plan Note (Addendum)
These are improved. He still has some symptoms of prolapse and rare rectal bleeding. He believes that he is more compliant with fiber things will improve even further. We did discuss the possibility of hemorrhoidal ligation or other treatment today he is not yet ready to consider that. The plan is for him to consider in the future, he will return in January. In the meantime to be more compliant with fiber supplementation as well as continue the hydrocortisone cream. I did explain that that is not going to be a good chronic long-term treatment. Also, he has radiation proctitis which may be part of the problem of bleeding anyway.

## 2011-05-07 ENCOUNTER — Encounter: Payer: Self-pay | Admitting: Pulmonary Disease

## 2011-05-07 ENCOUNTER — Ambulatory Visit (INDEPENDENT_AMBULATORY_CARE_PROVIDER_SITE_OTHER)
Admission: RE | Admit: 2011-05-07 | Discharge: 2011-05-07 | Disposition: A | Discharge status: Home / Self Care | Payer: Medicare Other | Source: Ambulatory Visit | Attending: Pulmonary Disease | Admitting: Pulmonary Disease

## 2011-05-07 ENCOUNTER — Other Ambulatory Visit (INDEPENDENT_AMBULATORY_CARE_PROVIDER_SITE_OTHER): Payer: Medicare Other

## 2011-05-07 ENCOUNTER — Ambulatory Visit (INDEPENDENT_AMBULATORY_CARE_PROVIDER_SITE_OTHER): Payer: Medicare Other | Admitting: Pulmonary Disease

## 2011-05-07 VITALS — BP 136/60 | HR 63 | Temp 98.4°F | Ht 69.5 in | Wt 205.0 lb

## 2011-05-07 DIAGNOSIS — I4949 Other premature depolarization: Secondary | ICD-10-CM

## 2011-05-07 DIAGNOSIS — I493 Ventricular premature depolarization: Secondary | ICD-10-CM | POA: Insufficient documentation

## 2011-05-07 DIAGNOSIS — K589 Irritable bowel syndrome without diarrhea: Secondary | ICD-10-CM

## 2011-05-07 DIAGNOSIS — M545 Low back pain, unspecified: Secondary | ICD-10-CM

## 2011-05-07 DIAGNOSIS — K219 Gastro-esophageal reflux disease without esophagitis: Secondary | ICD-10-CM

## 2011-05-07 DIAGNOSIS — I10 Essential (primary) hypertension: Secondary | ICD-10-CM

## 2011-05-07 DIAGNOSIS — E785 Hyperlipidemia, unspecified: Secondary | ICD-10-CM

## 2011-05-07 DIAGNOSIS — Z23 Encounter for immunization: Secondary | ICD-10-CM

## 2011-05-07 DIAGNOSIS — I679 Cerebrovascular disease, unspecified: Secondary | ICD-10-CM

## 2011-05-07 DIAGNOSIS — R7309 Other abnormal glucose: Secondary | ICD-10-CM

## 2011-05-07 DIAGNOSIS — I2510 Atherosclerotic heart disease of native coronary artery without angina pectoris: Secondary | ICD-10-CM

## 2011-05-07 DIAGNOSIS — N139 Obstructive and reflux uropathy, unspecified: Secondary | ICD-10-CM

## 2011-05-07 DIAGNOSIS — C61 Malignant neoplasm of prostate: Secondary | ICD-10-CM

## 2011-05-07 LAB — HEPATIC FUNCTION PANEL
AST: 23 U/L (ref 0–37)
Alkaline Phosphatase: 49 U/L (ref 39–117)
Bilirubin, Direct: 0.1 mg/dL (ref 0.0–0.3)
Total Bilirubin: 0.9 mg/dL (ref 0.3–1.2)

## 2011-05-07 LAB — BASIC METABOLIC PANEL
Chloride: 105 mEq/L (ref 96–112)
GFR: 61.84 mL/min (ref >60.00)
Potassium: 4.6 mEq/L (ref 3.5–5.1)
Sodium: 141 mEq/L (ref 135–145)

## 2011-05-07 LAB — CBC WITH DIFFERENTIAL/PLATELET
Eosinophils Absolute: 0.1 10*3/uL (ref 0.0–0.7)
Eosinophils Relative: 1.6 % (ref 0.0–5.0)
HCT: 42.2 % (ref 39.0–52.0)
Lymphs Abs: 1.3 10*3/uL (ref 0.7–4.0)
MCHC: 33.9 g/dL (ref 30.0–36.0)
MCV: 89 fl (ref 78.0–100.0)
Monocytes Absolute: 0.6 10*3/uL (ref 0.1–1.0)
Platelets: 202 10*3/uL (ref 150.0–400.0)
RDW: 13.5 % (ref 11.5–14.6)
WBC: 6.1 10*3/uL (ref 4.5–10.5)

## 2011-05-07 LAB — LIPID PANEL
LDL Cholesterol: 56 mg/dL (ref 0–99)
Total CHOL/HDL Ratio: 3
VLDL: 19.4 mg/dL (ref 0.0–40.0)

## 2011-05-07 LAB — TSH: TSH: 1.77 u[IU]/mL (ref 0.35–5.50)

## 2011-05-07 MED ORDER — ZOLPIDEM TARTRATE 10 MG PO TABS: 10.0000 mg | ORAL_TABLET | Freq: Every evening | ORAL | Status: DC | PRN

## 2011-05-07 NOTE — Progress Notes (Signed)
Subjective:    Patient ID: Dillon Young, male    DOB: April 16, 1933, 77 y.o.   MRN: [id number]  HPI 77 y/o WM here for a follow up visit... he has multiple medical problems as noted below...  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   ~  Apr10:   I last saw him Oct07 for a yearly follow up at that time- see problem list below... states he's doing well- no complaints... he states he quit smoking 2/29/10, and has been exercising- walking for 45 min 3x per week... he has noted a cough on the Lotensin therapy & we will need to change this med... he is also c/o some insomnia and wants a sleeping pill...  ~  July 12, 2009:  his BP was elevated & DrWeintraub placed him on HCT 25mg  +KCl daily... he states that DrRW did his Chol check as well & the pt reports that it was "OK" on Simva40 + Zetia10... he had the seasonal flu vaccine in 2010 & he had a follow up colonoscopy by DrGessner- no recurrent polyps... he has had several visits w/ DrBates for ENT- following his earaches & hearing...  ~  February 08, 2010:  54mo ROV- doing reasonably well but notes some fatigue & we discussed checking CXR (NAD) & fasting blood work (see below)... he notes tinnitus & we discussed Rx w/ background sounds to mask, consider f/u ENT if not helping... he saw DrWeintraub 3/11 for Cards f/u- doing well & no changes made (he noted some leg cramps & OK to decr HCTZ to 12.5mg  if nec)...  ~  May 07, 2011:  22mo & he reports that "everything is good" noting some ven insuffic, min edema, minor nocturnal leg cramps & we reviewed so salt, leg elevation, support hose & tonic water vs mustard Qhs prn...     He saw [facility] 8/12 for f/u on-going prob w/ ear wax, eustachian tube dysfunction, & turbinate hypertrophy...    His last f/u w/ DrRW was 15/12 for HBP, Chol, CAD w/ prev PCI- waking regularly, no angina, & rec to continue current medical management;  2DEcho 5/12  showed mild concLVH, normLVF w/ EF=>55% but some HK inferolaterally, mild LAdil, no signif valv abn.    He saw DrGessner for GI 11/12 for constip & prolapsing int hems> improved w/ incr fiber & AnusolHC cream- Note: hems injected 4/12 by DrHIngram...    We did f/u CXR (COPD, boderline heart size, NAD);  EKG w/ bigeminy & no palpit/ dizzy/ SOB/ etc;  Labs all look good on his Simva40, Zetia10, & diet management (see below)...          Problem List:   ALLERGIC RHINITIS (ICD-477.9) - spring allergies are a problem w/ all the pollen... he uses OTC antihistamines + nasal saline, FLONASE, & rec to incr Mucinex to 2Bid w/ fluids...  Hx of SINUSITIS (ICD-473.9) - prev hx of maxillary sinusitis treated w/ antibiotics, nasal saline, etc... he has finally quit smoking... MRI Brain 5/06 showed complete filling of the right max sinus w/ fluid... he is followed by [facility] for ENT...  ? of SLEEP APNEA (ICD-780.57) - DrWeintraub's notes indicate remote hx of sleep apnea but we don't have any records of prev sleep study (done at [facility])... notes some snoring complaints but no daytime hypersomnolence etc... he refused to wear CPAP although DrRW wanted him on this Rx.  Hx of BRONCHITIS (ICD-490) - occas bronchial infections treated w/  antibiotics... baseline CXR w/ calcif in Ao & prom fat pad at LV apex, clear lungs... can't find prev PFT's... ~  CXR 4/10 showed calcif in Ao, clear lungs, NAD.Marland Kitchen. ~  CXR 9/11 showed prom mediastinal fat, clear lungs, NAD.Marland Kitchen. ~  CXR 12/12 showed COPD, borderline heart size, clear lungs/ NAD...  CIGARETTE SMOKER (ICD-305.1) - he tells me that he quit smoking 07/25/08 (he's a leap year baby)...  HYPERTENSION (ICD-401.9) - on TOPROL XL 25mg /d, NORVASC 10mg /d, HYZAAR 100-12.5 + KCl 38mEq/d... BP= 136/60 and similar at home... denies HA, fatigue, visual changes, CP, palipit, dizziness, syncope, dyspnea, edema, etc...   CORONARY ARTERY DISEASE (ICD-414.00) - on ASA 81mg /d... followed  by DrWeintraub w/ known CAD & PCI to RCA & CIRC in 1994 & 1995... ~  2DEcho 12/08 showed norm LV & mild AoV sclerosis; & cardiolite was neg for ischemia... ~  2DEcho 6/10 showed mild conc LVH, norm LVF w/ EF= 55%, norm wall motion. ~  f/u DrRW 12/10 & HCTZ incr to 25mg /d, and ?Cozaar incr to 150mg /d (which he never did)... ~  Myoview 1/11 showed scar present, no ischemia, +ectopy, no change from prev... ~  f/u DrWeintraub 5/12- stable, no changes made.  CEREBROVASCULAR DISEASE (ICD-437.9) - prev MRI Brain 5/06 showed atrophy and small vessel disease + mild intracranial atherosclerotic changes on MRA... on ASA 81mg /d... ~  12/12:  He is due for f/u CDopplers & we will sched at [facility]...  HYPERLIPIDEMIA (ICD-272.4) - on ZOCOR 40mg /d + ZETIA 10mg /d w/ labs checked by DrWeintraub according to the pt.... ~  FLP 4/10 here on Simva80 showed TChol 163, TG 120, HDL 41, LDL 99 ~  FLP 9/11 here on Simva40+Zetia10 showed TChol 131, TG 166, HDL 32, LDL 66 ~  FLP 12/12 on Simva40+Zetia10 showed TChol 116, TG 97, HDL 40, LDL 56  DIABETES MELLITUS, BORDERLINE (ICD-790.29) - he is overweight- 205# (stable x 59yrs) w/ 5\' 10"  frame = BMI 30... BS values have been in the 125 - 130 range... we discussed low carb/ no sweets diet... ~  labs 4/10 (wt=205#) showed BS= 112, HgA1c= 6.2 ~  labs 9/11 (wt=202#) showed BS= 121, A1c= 6.7.Marland KitchenMarland Kitchen needs better diet, get weight down. ~  Labs 12/12 (wt=205#) on diet alone showed BS= 115, A1c= 6.5   GERD (ICD-530.81) - he denies GI symptoms...  IRRITABLE BOWEL SYNDROME (ICD-564.1) COLONIC POLYPS (ICD-211.3) - he had a follow up colonoscopy by DrGessner 5/10 that showed divertics, ?radiation proctitis, hems, & no recurrent polyps.  BENIGN PROSTATIC HYPERTROPHY, WITH OBSTRUCTION (ICD-600.01) - s/p TURP 6/05 & path benign. ADENOCARCINOMA, PROSTATE (ICD-185) - followed by ZOXWRUEA w/ prostate Ca diagnosed in 11/09 w/ PSA= 5.58... s/p XRT by DrManning completed 4/09, and then radium seed  implants... he continues to follow up w/ DrDavis Q42mo but we don't have any of his notes. ~  9/11:  he tells me he has an appt w/ DrWrenn 10/11 (we don't have Urology notes to review)...  LOW BACK PAIN SYNDROME (ICD-724.2) - hx lumbar laminectomy in the past...  HEALTH MAINTENANCE:  He receives the seasonal FLU vaccine each fall;  Given PNEUMOVAX 12/12...   Past Surgical History  Procedure Date  . Colonoscopy 10/21/2008    diverticulosis, radiation proctitis, internal hemorrhoids  . Appendectomy   . Lumbar laminectomy   . Transurethral resection of prostate 10/2003    Outpatient Encounter Prescriptions as of 05/07/2011  Medication Sig Dispense Refill  . amLODipine (NORVASC) 10 MG tablet Take 10 mg by mouth daily.        Marland Kitchen  aspirin 81 MG tablet Take 81 mg by mouth daily.        . cholecalciferol (VITAMIN D) 1000 UNITS tablet Take 1,000 Units by mouth daily.        Marland Kitchen dextromethorphan-guaiFENesin (MUCINEX DM) 30-600 MG per 12 hr tablet Take 1 tablet by mouth every 12 (twelve) hours as needed.       . ezetimibe (ZETIA) 10 MG tablet Take 10 mg by mouth daily.        . hydrocortisone (PROCTOSOL HC) 2.5 % rectal cream Place rectally 2 (two) times daily.  30 g  2  . losartan-hydrochlorothiazide (HYZAAR) 100-12.5 MG per tablet Take 1 tablet by mouth daily.        . metoprolol (TOPROL-XL) 50 MG 24 hr tablet TAKE ONE-HALF (1/2) TABLET ONCE DAILY  90 tablet  0  . potassium chloride SA (K-DUR,KLOR-CON) 20 MEQ tablet Take 1/2 tablet by mouth once daily      . simvastatin (ZOCOR) 80 MG tablet Take 40 mg by mouth at bedtime.        . vitamin C (ASCORBIC ACID) 500 MG tablet Take 500 mg by mouth daily.        . Wheat Dextrin (BENEFIBER) POWD Take by mouth. Takes as directed       . zolpidem (AMBIEN) 10 MG tablet Take 10 mg by mouth at bedtime as needed.        Marland Kitchen DISCONTD: vitamin E (VITAMIN E) 400 UNIT capsule Take 400 Units by mouth daily.          No Known Allergies   Current Medications,  Allergies, Past Medical History, Past Surgical History, Family History, and Social History were reviewed in [employer] record.    Review of Systems         See HPI - all other systems neg except as noted...  The patient complains of decreased hearing.  The patient denies anorexia, fever, weight loss, weight gain, vision loss, hoarseness, chest pain, syncope, dyspnea on exertion, peripheral edema, prolonged cough, headaches, hemoptysis, abdominal pain, melena, hematochezia, severe indigestion/heartburn, hematuria, incontinence, muscle weakness, suspicious skin lesions, transient blindness, difficulty walking, depression, unusual weight change, abnormal bleeding, enlarged lymph nodes, and angioedema.     Objective:   Physical Exam     WD, WN, 77 y/o WM in NAD... GENERAL:  Alert & oriented; pleasant & cooperative... HEENT:  [place]/AT, EOM-wnl, PERRLA, EACs-clear, TMs-wnl, NOSE-clear, THROAT-clear & wnl. NECK:  Supple w/ fairROM; no JVD; normal carotid impulses w/o bruits; no thyromegaly or nodules palpated; no lymphadenopathy. CHEST:  Clear to P & A; without wheezes/ rales/ or rhonchi. HEART:  Regular Rhythm; without murmurs/ rubs/ or gallops. ABDOMEN:  Soft & nontender; normal bowel sounds; no organomegaly or masses detected. RECTAL:  dererred to [facility] EXT: without deformities or arthritic changes; no varicose veins/ venous insuffic/ or edema. NEURO:  CN's intact; motor testing normal; sensory testing normal; gait normal & balance OK. DERM:  No lesions noted; no rash etc...  RADIOLOGY DATA:  Reviewed in the EPIC EMR & discussed w/ the patient...  LABORATORY DATA:  Reviewed in the EPIC EMR & discussed w/ the patient...   Assessment & Plan:   AR/ Hx Sinusitis>  Followed by [facility] for ENT & his note is reviewed...  Hx Bronchitis/ Ex-smoker>  He quit in 2010 & has remained quit, no recent URI or bronchitic exac...  HBP>  Controlled on meds as listed; continue  same...  CAD>  Followed by DrRW & stable; encouraged  to continue exercise etc...  Cerebrovasc Dis>  He is due for CDopplers & we will request [facility]...  Hyperlipid>  FLP looks good on Simva + Zetia along w/ diet etc...  DM>  On diet alone w/ good numbers, needs to work on weight reduction...  GI>  Followed by DrGessner> GERD, IBS, Polyps, & Hems- on Anusol & has had injection from DrIngram...  GU>  Prostate cancer>  Followed by DrWrenn now & pt will get records sent to [place]...   Patient's Medications  New Prescriptions   No medications on file  Previous Medications   AMLODIPINE (NORVASC) 10 MG TABLET    Take 10 mg by mouth daily.     ASPIRIN 81 MG TABLET    Take 81 mg by mouth daily.     CHOLECALCIFEROL (VITAMIN D) 1000 UNITS TABLET    Take 1,000 Units by mouth daily.     DEXTROMETHORPHAN-GUAIFENESIN (MUCINEX DM) 30-600 MG PER 12 HR TABLET    Take 1 tablet by mouth every 12 (twelve) hours as needed.    EZETIMIBE (ZETIA) 10 MG TABLET    Take 10 mg by mouth daily.     LOSARTAN-HYDROCHLOROTHIAZIDE (HYZAAR) 100-12.5 MG PER TABLET    Take 1 tablet by mouth daily.     METOPROLOL (TOPROL-XL) 50 MG 24 HR TABLET    TAKE ONE-HALF (1/2) TABLET ONCE DAILY   POTASSIUM CHLORIDE SA (K-DUR,KLOR-CON) 20 MEQ TABLET    Take 1/2 tablet by mouth once daily   SIMVASTATIN (ZOCOR) 80 MG TABLET    Take 40 mg by mouth at bedtime.     VITAMIN C (ASCORBIC ACID) 500 MG TABLET    Take 500 mg by mouth daily.     WHEAT DEXTRIN (BENEFIBER) POWD    Take by mouth. Takes as directed   Modified Medications   Modified Medication Previous Medication   HYDROCORTISONE (PROCTOSOL HC) 2.5 % RECTAL CREAM hydrocortisone (PROCTOSOL HC) 2.5 % rectal cream      Place rectally 2 (two) times daily. Use as needed    Place rectally 2 (two) times daily.   ZOLPIDEM (AMBIEN) 10 MG TABLET zolpidem (AMBIEN) 10 MG tablet      Take 1 tablet (10 mg total) by mouth at bedtime as needed.    Take 10 mg by mouth at bedtime as needed.      Discontinued Medications   VITAMIN E (VITAMIN E) 400 UNIT CAPSULE    Take 400 Units by mouth daily.

## 2011-05-07 NOTE — Patient Instructions (Signed)
Today we updated your med list in our EPIC system...    Continue your current medications the same...  We discussed taking the MUCINEX 2tabs twice daily w/ lots of fluids...    And using a SALINE nasal mist- spray your nose every 1-2 hours as needed...  For the nocturnal leg cramps:    Try the positioning that helps the most...    Try the "tonic water" about an hour before bedtime...    Or try the "yellow mustard" at bedtime...  Today we did your follow upCXR & fasting blood work...    Please call the PHONE TREE in a few days for your results...    Dial [id number] & when prompted enter your patient number followed by the # symbol...    Your patient number is:  [id number]#  We detected a faint noise or "bruit" in your left carotid area>    We recommend a screening Carotid doppler exam & we will get this sched for you...  We gave you the Pneumonia vaccine today...  Work on weight reduction by eating less & burning more...  Call for any questions...  Let's plan another check up in one year's time, sooner if needed for problems.Marland KitchenMarland Kitchen

## 2011-05-17 ENCOUNTER — Telehealth: Payer: Self-pay | Admitting: Pulmonary Disease

## 2011-05-17 MED ORDER — OSELTAMIVIR PHOSPHATE 75 MG PO CAPS: 75.0000 mg | ORAL_CAPSULE | Freq: Two times a day (BID) | ORAL | Status: AC

## 2011-05-17 NOTE — Telephone Encounter (Signed)
Called and spoke with pts wife and she is aware of SN recs for tamiflu 75mg   #10  1 po bid until gone.  Rest, fluids and cont the tylenol and mucinex.  tamiflu has been sent to the pharmacy.

## 2011-05-17 NOTE — Telephone Encounter (Signed)
Spoke with pt's spouse. She states that since this am pt has had fever of 102, dry cough, chest congestion and body aches. She states that he is taking tylenol and mucinex, but wants to know if something else should be called in. Please advise, thanks! No Known Allergies

## 2011-06-05 ENCOUNTER — Ambulatory Visit (INDEPENDENT_AMBULATORY_CARE_PROVIDER_SITE_OTHER): Payer: Medicare Other | Admitting: Internal Medicine

## 2011-06-05 VITALS — BP 132/64 | HR 66 | Ht 69.0 in | Wt 201.0 lb

## 2011-06-05 DIAGNOSIS — K649 Unspecified hemorrhoids: Secondary | ICD-10-CM

## 2011-06-05 DIAGNOSIS — K6289 Other specified diseases of anus and rectum: Secondary | ICD-10-CM

## 2011-06-05 DIAGNOSIS — K5909 Other constipation: Secondary | ICD-10-CM

## 2011-06-05 DIAGNOSIS — K5900 Constipation, unspecified: Secondary | ICD-10-CM

## 2011-06-05 DIAGNOSIS — K627 Radiation proctitis: Secondary | ICD-10-CM

## 2011-06-05 MED ORDER — HYDROCORTISONE 2.5 % RE CREA: TOPICAL_CREAM | Freq: Two times a day (BID) | RECTAL | Status: DC

## 2011-06-05 NOTE — Assessment & Plan Note (Signed)
Probably not active given response to Benefiber and hemorrhoid cream.

## 2011-06-05 NOTE — Progress Notes (Signed)
  Subjective:    Patient ID: Dillon Young, male    DOB: [birth year], 78 y.o.   MRN: [id number]  HPI He returns to follow-up hemorrhoids, constipation and radiation proctitis. Using Benefiber daily allows defecation at least every other day. Hemorrhoids not swollen and not bleeding. Has continued to use the hydrocortisone cream on his finger (applied to hemorrhoids) about every day. Recent CBC normal.     Review of Systems As above    Objective:   Physical Exam WDWN NAD - elderly wm       Assessment & Plan:

## 2011-06-05 NOTE — Assessment & Plan Note (Signed)
Better with regular Benefiber - to continue.

## 2011-06-05 NOTE — Patient Instructions (Signed)
Follow up with Dr. Leone Payor as needed. If you start needing your hemorrhoidal cream more that a few times a month please return to see Dr. Leone Payor.

## 2011-06-05 NOTE — Assessment & Plan Note (Signed)
Improved - change to as needed hydrocortisone cream and contine Benefiber.

## 2011-06-06 ENCOUNTER — Encounter: Payer: Self-pay | Admitting: Pulmonary Disease

## 2011-07-13 ENCOUNTER — Ambulatory Visit: Payer: Medicare Other | Admitting: Internal Medicine

## 2011-10-30 ENCOUNTER — Other Ambulatory Visit: Payer: Self-pay

## 2011-10-30 DIAGNOSIS — I6529 Occlusion and stenosis of unspecified carotid artery: Secondary | ICD-10-CM

## 2011-10-31 ENCOUNTER — Encounter: Payer: Self-pay | Admitting: Vascular Surgery

## 2011-11-01 ENCOUNTER — Encounter: Payer: Self-pay | Admitting: Vascular Surgery

## 2011-11-01 ENCOUNTER — Other Ambulatory Visit (INDEPENDENT_AMBULATORY_CARE_PROVIDER_SITE_OTHER): Payer: Medicare Other | Admitting: *Deleted

## 2011-11-01 ENCOUNTER — Ambulatory Visit (INDEPENDENT_AMBULATORY_CARE_PROVIDER_SITE_OTHER): Payer: Medicare Other | Admitting: Vascular Surgery

## 2011-11-01 ENCOUNTER — Other Ambulatory Visit: Payer: Self-pay

## 2011-11-01 VITALS — BP 168/70 | HR 68 | Resp 20 | Ht 69.0 in | Wt 200.0 lb

## 2011-11-01 DIAGNOSIS — I6529 Occlusion and stenosis of unspecified carotid artery: Secondary | ICD-10-CM

## 2011-11-01 DIAGNOSIS — I779 Disorder of arteries and arterioles, unspecified: Secondary | ICD-10-CM | POA: Insufficient documentation

## 2011-11-01 NOTE — Progress Notes (Signed)
[facility] [place]   Patient name: Dillon Young MRN: [id number] DOB: 03/20/1933 Sex: male   Referred by: Weintraub  Reason for referral:  Chief Complaint  Patient presents with  . Carotid    NEW CAROTID  REFERRED BY DR WEINTRAUB    HISTORY OF PRESENT ILLNESS: The patient is a very active healthy 78-year-old gentleman who presents for evaluation of progressively severe asymptomatic right internal carotid artery stenosis. This is been followed for a number of years with serial carotid duplex evaluations. He has progressed to the critical level of stenosis on the right side. He does not have any severe stenosis on the left and remains in the 50-70% range. He does have occasional episodes of unsteady gait but no history of transient ischemic attack amaurosis fugax or stroke. He does have a remote history of cardiac coronary artery disease with stenting in the mid 1990s  Past Medical History  Diagnosis Date  . Allergic rhinitis   . Sleep apnea   . Smoker   . HTN (hypertension)   . CAD (coronary artery disease)   . CVA (cerebral infarction)   . Hyperlipidemia   . Diabetes mellitus   . GERD (gastroesophageal reflux disease)   . IBS (irritable bowel syndrome)   . Diverticulitis   . Hemorrhoids   . BPH (benign prostatic hyperplasia)   . Prostate cancer 2008    seed implantation  . Low back pain   . Adenomatous colon polyp     Past Surgical History  Procedure Date  . Colonoscopy 10/21/2008    diverticulosis, radiation proctitis, internal hemorrhoids  . Appendectomy   . Lumbar laminectomy   . Transurethral resection of prostate 10/2003    History   Social History  . Marital Status: Married    Spouse Name: N/A    Number of Children: 2  . Years of Education: N/A   Occupational History  . [place] [employer]    Social History Main Topics  . Smoking status: Former Smoker -- 1.5 packs/day for 60 years    Types: Cigarettes    Quit date:  07/25/2008  . Smokeless tobacco: Never Used  . Alcohol Use: Yes     Occ  . Drug Use: No  . Sexually Active: Not on file   Other Topics Concern  . Not on file   Social History Narrative  . No narrative on file    Family History  Problem Relation Age of Onset  . Heart disease Father     Allergies as of 11/01/2011  . (No Known Allergies)    Current Outpatient Prescriptions on File Prior to Visit  Medication Sig Dispense Refill  . amLODipine (NORVASC) 10 MG tablet Take 10 mg by mouth daily.        . aspirin 81 MG tablet Take 81 mg by mouth daily.        . cholecalciferol (VITAMIN D) 1000 UNITS tablet Take 1,000 Units by mouth daily.        . dextromethorphan-guaiFENesin (MUCINEX DM) 30-600 MG per 12 hr tablet Take 1 tablet by mouth every 12 (twelve) hours as needed.       . ezetimibe (ZETIA) 10 MG tablet Take 10 mg by mouth daily.        . hydrocortisone (PROCTOSOL HC) 2.5 % rectal cream Place rectally 2 (two) times daily. Use as needed  30 g  2  . losartan-hydrochlorothiazide (HYZAAR) 100-12.5 MG per tablet Take 1 tablet by mouth daily.        .   metoprolol (TOPROL-XL) 50 MG 24 hr tablet TAKE ONE-HALF (1/2) TABLET ONCE DAILY  90 tablet  0  . potassium chloride SA (K-DUR,KLOR-CON) 20 MEQ tablet Take 1/2 tablet by mouth once daily      . simvastatin (ZOCOR) 80 MG tablet Take 40 mg by mouth at bedtime.        . vitamin C (ASCORBIC ACID) 500 MG tablet Take 500 mg by mouth daily.        . Wheat Dextrin (BENEFIBER) POWD Take by mouth. Takes as directed       . zolpidem (AMBIEN) 10 MG tablet Take 1 tablet (10 mg total) by mouth at bedtime as needed.  30 tablet  5     REVIEW OF SYSTEMS:  Positives indicated with an "X"  CARDIOVASCULAR:  [ ] chest pain   [ ] chest pressure   [ ] palpitations   [ ] orthopnea   [ ] dyspnea on exertion   [ ] claudication   [ ] rest pain   [ ] DVT   [ ] phlebitis PULMONARY:   [ ] productive cough   [ ] asthma   [ ] wheezing NEUROLOGIC:   [ ] weakness   [ ] paresthesias  [ ] aphasia  [ ] amaurosis  [ ] dizziness HEMATOLOGIC:   [ ] bleeding problems   [ ] clotting disorders MUSCULOSKELETAL:  [ ] joint pain   [ ] joint swelling GASTROINTESTINAL: [ ]  blood in stool  [ ]  hematemesis GENITOURINARY:  [ ]  dysuria  [ ]  hematuria PSYCHIATRIC:  [ ] history of major depression INTEGUMENTARY:  [ ] rashes  [ ] ulcers CONSTITUTIONAL:  [ ] fever   [ ] chills  PHYSICAL EXAMINATION:  General: The patient is a well-nourished male, in no acute distress. Vital signs are BP 168/70  Pulse 68  Resp 20  Ht 5' 9" (1.753 m)  Wt 200 lb (90.719 kg)  BMI 29.53 kg/m2 Pulmonary: There is a good air exchange bilaterally without wheezing or rales. Abdomen: Soft and non-tender with normal pitch bowel sounds. No aneurysm palpable Musculoskeletal: There are no major deformities.  There is no significant extremity pain. Neurologic: No focal weakness or paresthesias are detected, Skin: There are no ulcer or rashes noted. Psychiatric: The patient has normal affect. Cardiovascular: There is a regular rate and rhythm without significant murmur appreciated. Pulse status: 2+ radial, 2+ femoral, 2+ popliteal, 2+ posterior tibial pulses bilaterally Carotid arteries: I do not hear bruit. He has normal 2+ carotid pulsations bilaterally [facility] Vascular Lab Studies:  Ordered and Independently Reviewed he did have a repeat of his right carotid duplex in our office to determine if he had normal internal carotid artery past his bifurcation. This did show 80% or greater stenosis with end-diastolic velocities of 107 cm/s. He did have taken to normal internal carotid artery past the bifurcation.  Impression and Plan:  Progressive severe asymptomatic right internal carotid artery stenosis. Had a very long talk with the patient and his wife present. I have recommended right carotid endarterectomy for reduction of stroke risk. I explained the procedure is a one night hospitalization  anticipated. I explained that this would be under general anesthesia. I explained to 1-2% risk of stroke with surgery and a very low risk for cranial nerve injury, bleeding or infection. I did explain potential for approximately 5% annual transient ischemic attack or stroke risk with his asymptomatic high-grade stenosis. He understands and wishes to proceed   we'll schedule this for June 17 with admission day surgery    Lyonel Morejon [facility] [place] Office: [phone]        

## 2011-11-02 ENCOUNTER — Encounter (HOSPITAL_COMMUNITY): Payer: Self-pay | Admitting: Pharmacy Technician

## 2011-11-07 ENCOUNTER — Encounter (HOSPITAL_COMMUNITY): Payer: Self-pay

## 2011-11-07 ENCOUNTER — Encounter (HOSPITAL_COMMUNITY)
Admission: RE | Admit: 2011-11-07 | Discharge: 2011-11-07 | Disposition: A | Discharge status: Home / Self Care | Payer: Medicare Other | Source: Ambulatory Visit | Attending: Vascular Surgery | Admitting: Vascular Surgery

## 2011-11-07 HISTORY — DX: Chronic kidney disease, unspecified: N18.9

## 2011-11-07 LAB — CBC
HCT: 44.4 % (ref 39.0–52.0)
MCV: 87.6 fL (ref 78.0–100.0)
RBC: 5.07 MIL/uL (ref 4.22–5.81)
RDW: 13.1 % (ref 11.5–15.5)
WBC: 6.2 10*3/uL (ref 4.0–10.5)

## 2011-11-07 LAB — URINALYSIS, ROUTINE W REFLEX MICROSCOPIC
Bilirubin Urine: NEGATIVE
Hgb urine dipstick: NEGATIVE
Protein, ur: NEGATIVE mg/dL
Urobilinogen, UA: 0.2 mg/dL (ref 0.0–1.0)

## 2011-11-07 LAB — COMPREHENSIVE METABOLIC PANEL
BUN: 25 mg/dL — ABNORMAL HIGH (ref 6–23)
CO2: 30 mEq/L (ref 19–32)
Chloride: 102 mEq/L (ref 96–112)
Creatinine, Ser: 1.17 mg/dL (ref 0.50–1.35)
GFR calc Af Amer: 68 mL/min — ABNORMAL LOW (ref >90)
GFR calc non Af Amer: 58 mL/min — ABNORMAL LOW (ref >90)
Total Bilirubin: 0.6 mg/dL (ref 0.3–1.2)

## 2011-11-07 LAB — TYPE AND SCREEN
ABO/RH(D): O POS
Antibody Screen: NEGATIVE

## 2011-11-07 LAB — SURGICAL PCR SCREEN: Staphylococcus aureus: NEGATIVE

## 2011-11-07 NOTE — Progress Notes (Signed)
EKG  Obtained from Dr Kandis Cocking office- EKG appears to be Junctional Rhythm with PVCs.  I requested office notes and any other testing pt had at Dr. Kandis Cocking office.  I will have Edmonia Caprio to review patient's chart.

## 2011-11-07 NOTE — Pre-Procedure Instructions (Signed)
20 Dillon Young  11/07/2011   Your procedure is scheduled on:  Monday, June 17th.  Report to [facility] at 7:30 AM.  Call this number if you have problems the morning of surgery: [phone]   Remember:   Do not eat food or drink liquids: After Midnight.    Take these medicines the morning of surgery with A SIP OF WATER: Metoprolol (Toprolol), Amolodipione (Norvasc).   Do not wear jewelry, make-up or nail polish.  Do not wear lotions, powders, or perfumes. You may wear deodorant.  Do not shave 48 hours prior to surgery. Men may shave face and neck.  Do not bring valuables to the hospital.  Contacts, dentures or bridgework may not be worn into surgery.  Leave suitcase in the car. After surgery it may be brought to your room.  For patients admitted to the hospital, checkout time is 11:00 AM the day of discharge.   Patients discharged the day of surgery will not be allowed to drive home.  Name and phone number of your driver:   NA  Special Instructions: CHG Shower Use Special Wash: 1/2 bottle night before surgery and 1/2 bottle morning of surgery.   Please read over the following fact sheets that you were given: Pain Booklet, Coughing and Deep Breathing, Blood Transfusion Information, MRSA Information and Surgical Site Infection Prevention

## 2011-11-07 NOTE — Procedures (Unsigned)
CAROTID DUPLEX EXAM  INDICATION:  Bruit,  carotid artery disease.  HISTORY: Diabetes:  Borderline Cardiac:  Arrhythmias. Hypertension:  Yes Smoking:  Quit Previous Surgery: CV History:  Patient complains of right finger numbness on occasion. Amaurosis Fugax No, Paresthesias No, Hemiparesis No                                      RIGHT             LEFT Brachial systolic pressure:         160               154 Brachial Doppler waveforms:         Triphasic.        Triphasic. Vertebral direction of flow:        Antegrade. DUPLEX VELOCITIES (cm/sec) CCA peak systolic                   124 ECA peak systolic                   160 ICA peak systolic                   315 ICA end diastolic                   107 PLAQUE MORPHOLOGY:                  Heterogenous, irregular. PLAQUE AMOUNT:                      Moderate to severe. PLAQUE LOCATION:                    Bifurcation/ICA, CCA  IMPRESSION:  60% to 79% right ICA stenosis, upper end of range. Left carotid system not evaluated. Right vertebral artery is antegrade.  ___________________________________________ Larina Earthly, M.D.  SS/MEDQ  D:  11/01/2011  T:  11/01/2011  Job:  [id number]

## 2011-11-08 NOTE — Consult Note (Signed)
Anesthesia Chart Review:  Patient is a 78 year old male scheduled for right CEA on 11/12/11.  History includes former smoker, HTN, CAD s/p PCI (POBA of the CX '94, '95), IBS, CVA, prostate CA s/p radioactive seed implant (Dr. Annabell Howells), borderline DM2, GERD, HLD, BPH, OSA.  PCP is Dr. Olean Ree.  His Cardiologist is Dr. Alanda Amass [facility]). He was last seen on 10/26/11.  EKG then showed "low voltage in limb leads, LAD, PRWP V1-V3, non diagnostic Q waves, unifocal PVCs that are asymptomatic."  Recent carotid duplex showed progression of his right ICA stenosis, so Dr. Alanda Amass referred him to [facility] for surgical consideration.  Stress test on 06/10/09 [facility]) showed scar in the basal to mid inferior wall and apical region, no significant ischemia, low risk scan.    Echo on 10/25/10 ([facility]) showed:normal LV systolic function, EF > 55%, basal lateral and inferolateral hypokinesis, findings suggestive of pseudonormalization, mildly dilated La, no significant valvular abnormalities.  CXR on 05/07/11 showed stable hyperaeration and borderline cardiomegaly. No active lung disease.  Labs acceptable.  Plan to proceed.  Shonna Chock, PA-C

## 2011-11-11 MED ORDER — DEXTROSE 5 % IV SOLN
1.5000 g | INTRAVENOUS | Status: AC
  Administered 2011-11-12: 1.5 g via INTRAVENOUS
  Filled 2011-11-11: qty 1.5

## 2011-11-12 ENCOUNTER — Encounter (HOSPITAL_COMMUNITY)
Admission: RE | Disposition: A | Discharge status: Home / Self Care | Payer: Self-pay | Source: Ambulatory Visit | Attending: Vascular Surgery

## 2011-11-12 ENCOUNTER — Ambulatory Visit (HOSPITAL_COMMUNITY): Payer: Medicare Other | Admitting: Vascular Surgery

## 2011-11-12 ENCOUNTER — Encounter (HOSPITAL_COMMUNITY): Payer: Self-pay | Admitting: Vascular Surgery

## 2011-11-12 ENCOUNTER — Encounter (HOSPITAL_COMMUNITY): Payer: Self-pay | Admitting: *Deleted

## 2011-11-12 ENCOUNTER — Inpatient Hospital Stay (HOSPITAL_COMMUNITY)
Admission: RE | Admit: 2011-11-12 | Discharge: 2011-11-13 | DRG: 039 | Disposition: A | Discharge status: Home / Self Care | Payer: Medicare Other | Source: Ambulatory Visit | Attending: Vascular Surgery | Admitting: Vascular Surgery

## 2011-11-12 DIAGNOSIS — Z8673 Personal history of transient ischemic attack (TIA), and cerebral infarction without residual deficits: Secondary | ICD-10-CM

## 2011-11-12 DIAGNOSIS — Z7982 Long term (current) use of aspirin: Secondary | ICD-10-CM

## 2011-11-12 DIAGNOSIS — I6529 Occlusion and stenosis of unspecified carotid artery: Secondary | ICD-10-CM

## 2011-11-12 DIAGNOSIS — Z87891 Personal history of nicotine dependence: Secondary | ICD-10-CM

## 2011-11-12 DIAGNOSIS — K589 Irritable bowel syndrome without diarrhea: Secondary | ICD-10-CM | POA: Diagnosis present

## 2011-11-12 DIAGNOSIS — I129 Hypertensive chronic kidney disease with stage 1 through stage 4 chronic kidney disease, or unspecified chronic kidney disease: Secondary | ICD-10-CM | POA: Diagnosis present

## 2011-11-12 DIAGNOSIS — Z01812 Encounter for preprocedural laboratory examination: Secondary | ICD-10-CM

## 2011-11-12 DIAGNOSIS — K219 Gastro-esophageal reflux disease without esophagitis: Secondary | ICD-10-CM | POA: Diagnosis present

## 2011-11-12 DIAGNOSIS — N189 Chronic kidney disease, unspecified: Secondary | ICD-10-CM | POA: Diagnosis present

## 2011-11-12 DIAGNOSIS — Z8546 Personal history of malignant neoplasm of prostate: Secondary | ICD-10-CM

## 2011-11-12 DIAGNOSIS — E785 Hyperlipidemia, unspecified: Secondary | ICD-10-CM | POA: Diagnosis present

## 2011-11-12 DIAGNOSIS — E119 Type 2 diabetes mellitus without complications: Secondary | ICD-10-CM | POA: Diagnosis present

## 2011-11-12 DIAGNOSIS — Z860100 Personal history of colon polyps, unspecified: Secondary | ICD-10-CM

## 2011-11-12 DIAGNOSIS — Z8249 Family history of ischemic heart disease and other diseases of the circulatory system: Secondary | ICD-10-CM

## 2011-11-12 DIAGNOSIS — I2510 Atherosclerotic heart disease of native coronary artery without angina pectoris: Secondary | ICD-10-CM | POA: Diagnosis present

## 2011-11-12 DIAGNOSIS — Z79899 Other long term (current) drug therapy: Secondary | ICD-10-CM

## 2011-11-12 HISTORY — PX: ENDARTERECTOMY: SHX5162

## 2011-11-12 LAB — GLUCOSE, CAPILLARY
Glucose-Capillary: 126 mg/dL — ABNORMAL HIGH (ref 70–99)
Glucose-Capillary: 123 mg/dL — ABNORMAL HIGH (ref 70–99)

## 2011-11-12 SURGERY — ENDARTERECTOMY, CAROTID
Anesthesia: General | Site: Neck | Laterality: Right | Wound class: Clean

## 2011-11-12 MED ORDER — PROTAMINE SULFATE 10 MG/ML IV SOLN
INTRAVENOUS | Status: DC | PRN
  Administered 2011-11-12 (×2): 10 mg via INTRAVENOUS
  Administered 2011-11-12: 30 mg via INTRAVENOUS

## 2011-11-12 MED ORDER — SENNOSIDES-DOCUSATE SODIUM 8.6-50 MG PO TABS
1.0000 | ORAL_TABLET | Freq: Every evening | ORAL | Status: DC | PRN
  Filled 2011-11-12: qty 1

## 2011-11-12 MED ORDER — ONDANSETRON HCL 4 MG/2ML IJ SOLN
4.0000 mg | Freq: Four times a day (QID) | INTRAMUSCULAR | Status: DC | PRN
  Administered 2011-11-13: 4 mg via INTRAVENOUS
  Filled 2011-11-12: qty 2

## 2011-11-12 MED ORDER — SODIUM CHLORIDE 0.9 % IV SOLN
INTRAVENOUS | Status: DC
  Administered 2011-11-12: 14:00:00 via INTRAVENOUS

## 2011-11-12 MED ORDER — VITAMIN D3 25 MCG (1000 UNIT) PO TABS
1000.0000 [IU] | ORAL_TABLET | Freq: Every day | ORAL | Status: DC
  Administered 2011-11-12 – 2011-11-13 (×2): 1000 [IU] via ORAL
  Filled 2011-11-12 (×2): qty 1

## 2011-11-12 MED ORDER — DOPAMINE-DEXTROSE 3.2-5 MG/ML-% IV SOLN: 3.0000 ug/kg/min | INTRAVENOUS | Status: DC

## 2011-11-12 MED ORDER — HYDROCHLOROTHIAZIDE 25 MG PO TABS
12.5000 mg | ORAL_TABLET | Freq: Every day | ORAL | Status: DC
  Filled 2011-11-12: qty 0.5

## 2011-11-12 MED ORDER — VECURONIUM BROMIDE 10 MG IV SOLR
INTRAVENOUS | Status: DC | PRN
  Administered 2011-11-12: 10 mg via INTRAVENOUS

## 2011-11-12 MED ORDER — ACETAMINOPHEN 325 MG PO TABS: 325.0000 mg | ORAL_TABLET | ORAL | Status: DC | PRN

## 2011-11-12 MED ORDER — OXYCODONE HCL 5 MG PO TABS
5.0000 mg | ORAL_TABLET | ORAL | Status: DC | PRN
  Administered 2011-11-13: 5 mg via ORAL
  Filled 2011-11-12: qty 1

## 2011-11-12 MED ORDER — LACTATED RINGERS IV SOLN
INTRAVENOUS | Status: DC
  Administered 2011-11-12: 09:00:00 via INTRAVENOUS

## 2011-11-12 MED ORDER — LABETALOL HCL 5 MG/ML IV SOLN: 10.0000 mg | INTRAVENOUS | Status: DC | PRN

## 2011-11-12 MED ORDER — METOPROLOL TARTRATE 1 MG/ML IV SOLN: 2.0000 mg | INTRAVENOUS | Status: DC | PRN

## 2011-11-12 MED ORDER — LABETALOL HCL 5 MG/ML IV SOLN
INTRAVENOUS | Status: DC | PRN
  Administered 2011-11-12: 2.5 mg via INTRAVENOUS

## 2011-11-12 MED ORDER — EPHEDRINE SULFATE 50 MG/ML IJ SOLN
INTRAMUSCULAR | Status: DC | PRN
  Administered 2011-11-12 (×4): 5 mg via INTRAVENOUS
  Administered 2011-11-12: 10 mg via INTRAVENOUS

## 2011-11-12 MED ORDER — PROPOFOL 10 MG/ML IV EMUL
INTRAVENOUS | Status: DC | PRN
  Administered 2011-11-12: 120 mg via INTRAVENOUS

## 2011-11-12 MED ORDER — MORPHINE SULFATE 2 MG/ML IJ SOLN
2.0000 mg | INTRAMUSCULAR | Status: DC | PRN
  Administered 2011-11-12 (×2): 2 mg via INTRAVENOUS
  Filled 2011-11-12 (×2): qty 1

## 2011-11-12 MED ORDER — POTASSIUM CHLORIDE CRYS ER 10 MEQ PO TBCR
10.0000 meq | EXTENDED_RELEASE_TABLET | Freq: Every day | ORAL | Status: DC
  Administered 2011-11-12 – 2011-11-13 (×2): 10 meq via ORAL
  Filled 2011-11-12 (×2): qty 1

## 2011-11-12 MED ORDER — ACETAMINOPHEN 650 MG RE SUPP: 325.0000 mg | RECTAL | Status: DC | PRN

## 2011-11-12 MED ORDER — LACTATED RINGERS IV SOLN
INTRAVENOUS | Status: DC | PRN
  Administered 2011-11-12 (×2): via INTRAVENOUS

## 2011-11-12 MED ORDER — PHENOL 1.4 % MT LIQD
1.0000 | OROMUCOSAL | Status: DC | PRN
  Filled 2011-11-12: qty 177

## 2011-11-12 MED ORDER — EZETIMIBE 10 MG PO TABS
10.0000 mg | ORAL_TABLET | Freq: Every day | ORAL | Status: DC
  Administered 2011-11-12 – 2011-11-13 (×2): 10 mg via ORAL
  Filled 2011-11-12 (×2): qty 1

## 2011-11-12 MED ORDER — POTASSIUM CHLORIDE CRYS ER 20 MEQ PO TBCR: 20.0000 meq | EXTENDED_RELEASE_TABLET | Freq: Once | ORAL | Status: AC | PRN

## 2011-11-12 MED ORDER — HEPARIN SODIUM (PORCINE) 1000 UNIT/ML IJ SOLN
INTRAMUSCULAR | Status: DC | PRN
Start: 2011-11-12 — End: 2011-11-12
  Administered 2011-11-12: 9000 [IU] via INTRAVENOUS

## 2011-11-12 MED ORDER — ONDANSETRON HCL 4 MG/2ML IJ SOLN
INTRAMUSCULAR | Status: DC | PRN
  Administered 2011-11-12: 4 mg via INTRAVENOUS

## 2011-11-12 MED ORDER — PROMETHAZINE HCL 25 MG/ML IJ SOLN: 6.2500 mg | INTRAMUSCULAR | Status: DC | PRN

## 2011-11-12 MED ORDER — FENTANYL CITRATE 0.05 MG/ML IJ SOLN
INTRAMUSCULAR | Status: AC
  Filled 2011-11-12: qty 2

## 2011-11-12 MED ORDER — SODIUM CHLORIDE 0.9 % IV SOLN: INTRAVENOUS | Status: DC

## 2011-11-12 MED ORDER — HYDROCHLOROTHIAZIDE 12.5 MG PO CAPS
12.5000 mg | ORAL_CAPSULE | Freq: Every day | ORAL | Status: DC
  Administered 2011-11-12 – 2011-11-13 (×2): 12.5 mg via ORAL
  Filled 2011-11-12 (×2): qty 1

## 2011-11-12 MED ORDER — MIDAZOLAM HCL 2 MG/2ML IJ SOLN: 0.5000 mg | Freq: Once | INTRAMUSCULAR | Status: DC | PRN

## 2011-11-12 MED ORDER — HYDRALAZINE HCL 20 MG/ML IJ SOLN: 10.0000 mg | INTRAMUSCULAR | Status: DC | PRN

## 2011-11-12 MED ORDER — VITAMIN C 500 MG PO TABS
500.0000 mg | ORAL_TABLET | Freq: Every day | ORAL | Status: DC
  Administered 2011-11-12 – 2011-11-13 (×2): 500 mg via ORAL
  Filled 2011-11-12 (×2): qty 1

## 2011-11-12 MED ORDER — PANTOPRAZOLE SODIUM 40 MG PO TBEC: 40.0000 mg | DELAYED_RELEASE_TABLET | Freq: Every day | ORAL | Status: DC

## 2011-11-12 MED ORDER — AMLODIPINE BESYLATE 10 MG PO TABS
10.0000 mg | ORAL_TABLET | Freq: Every day | ORAL | Status: DC
  Administered 2011-11-13: 10 mg via ORAL
  Filled 2011-11-12: qty 1

## 2011-11-12 MED ORDER — SODIUM CHLORIDE 0.9 % IR SOLN
Status: DC | PRN
  Administered 2011-11-12: 11:00:00

## 2011-11-12 MED ORDER — METOPROLOL SUCCINATE ER 25 MG PO TB24
25.0000 mg | ORAL_TABLET | Freq: Every day | ORAL | Status: DC
  Administered 2011-11-13: 25 mg via ORAL
  Filled 2011-11-12: qty 1

## 2011-11-12 MED ORDER — 0.9 % SODIUM CHLORIDE (POUR BTL) OPTIME
TOPICAL | Status: DC | PRN
  Administered 2011-11-12 (×2): 1000 mL

## 2011-11-12 MED ORDER — BISACODYL 5 MG PO TBEC: 5.0000 mg | DELAYED_RELEASE_TABLET | Freq: Every day | ORAL | Status: DC | PRN

## 2011-11-12 MED ORDER — ALUM & MAG HYDROXIDE-SIMETH 200-200-20 MG/5ML PO SUSP: 15.0000 mL | ORAL | Status: DC | PRN

## 2011-11-12 MED ORDER — ASPIRIN EC 81 MG PO TBEC
81.0000 mg | DELAYED_RELEASE_TABLET | Freq: Every day | ORAL | Status: DC
  Administered 2011-11-12 – 2011-11-13 (×2): 81 mg via ORAL
  Filled 2011-11-12 (×2): qty 1

## 2011-11-12 MED ORDER — DOCUSATE SODIUM 100 MG PO CAPS: 100.0000 mg | ORAL_CAPSULE | Freq: Every day | ORAL | Status: DC

## 2011-11-12 MED ORDER — ASPIRIN EC 325 MG PO TBEC: 325.0000 mg | DELAYED_RELEASE_TABLET | Freq: Every day | ORAL | Status: DC

## 2011-11-12 MED ORDER — MEPERIDINE HCL 25 MG/ML IJ SOLN: 6.2500 mg | INTRAMUSCULAR | Status: DC | PRN

## 2011-11-12 MED ORDER — SODIUM CHLORIDE 0.9 % IV SOLN: 500.0000 mL | Freq: Once | INTRAVENOUS | Status: AC | PRN

## 2011-11-12 MED ORDER — SIMVASTATIN 40 MG PO TABS
40.0000 mg | ORAL_TABLET | Freq: Every day | ORAL | Status: DC
  Administered 2011-11-12: 40 mg via ORAL
  Filled 2011-11-12 (×2): qty 1

## 2011-11-12 MED ORDER — HYDROMORPHONE HCL PF 1 MG/ML IJ SOLN
INTRAMUSCULAR | Status: AC
  Filled 2011-11-12: qty 1

## 2011-11-12 MED ORDER — GUAIFENESIN-DM 100-10 MG/5ML PO SYRP: 15.0000 mL | ORAL_SOLUTION | ORAL | Status: DC | PRN

## 2011-11-12 MED ORDER — DEXTROSE 5 % IV SOLN
1.5000 g | Freq: Two times a day (BID) | INTRAVENOUS | Status: DC
  Administered 2011-11-12: 1.5 g via INTRAVENOUS
  Filled 2011-11-12 (×2): qty 1.5

## 2011-11-12 MED ORDER — MAGNESIUM SULFATE 40 MG/ML IJ SOLN
2.0000 g | Freq: Once | INTRAMUSCULAR | Status: AC | PRN
  Filled 2011-11-12: qty 50

## 2011-11-12 MED ORDER — GLYCOPYRROLATE 0.2 MG/ML IJ SOLN
INTRAMUSCULAR | Status: DC | PRN
  Administered 2011-11-12: .2 mg via INTRAVENOUS
  Administered 2011-11-12: .6 mg via INTRAVENOUS

## 2011-11-12 MED ORDER — LOSARTAN POTASSIUM-HCTZ 100-12.5 MG PO TABS: 1.0000 | ORAL_TABLET | Freq: Every day | ORAL | Status: DC

## 2011-11-12 MED ORDER — LOSARTAN POTASSIUM 50 MG PO TABS
100.0000 mg | ORAL_TABLET | Freq: Every day | ORAL | Status: DC
  Administered 2011-11-12 – 2011-11-13 (×2): 100 mg via ORAL
  Filled 2011-11-12 (×2): qty 2

## 2011-11-12 MED ORDER — LIDOCAINE HCL (CARDIAC) 20 MG/ML IV SOLN
INTRAVENOUS | Status: DC | PRN
  Administered 2011-11-12: 30 mg via INTRAVENOUS

## 2011-11-12 MED ORDER — FENTANYL CITRATE 0.05 MG/ML IJ SOLN
INTRAMUSCULAR | Status: DC | PRN
  Administered 2011-11-12: 50 ug via INTRAVENOUS
  Administered 2011-11-12: 250 ug via INTRAVENOUS
  Administered 2011-11-12 (×4): 50 ug via INTRAVENOUS

## 2011-11-12 MED ORDER — FENTANYL CITRATE 0.05 MG/ML IJ SOLN
25.0000 ug | INTRAMUSCULAR | Status: DC | PRN
  Administered 2011-11-12 (×4): 25 ug via INTRAVENOUS

## 2011-11-12 MED ORDER — NEOSTIGMINE METHYLSULFATE 1 MG/ML IJ SOLN
INTRAMUSCULAR | Status: DC | PRN
  Administered 2011-11-12: 4 mg via INTRAVENOUS

## 2011-11-12 SURGICAL SUPPLY — 46 items
APL SKNCLS STERI-STRIP NONHPOA (GAUZE/BANDAGES/DRESSINGS) ×1
BENZOIN TINCTURE PRP APPL 2/3 (GAUZE/BANDAGES/DRESSINGS) ×2 IMPLANT
CANISTER SUCTION 2500CC (MISCELLANEOUS) ×2 IMPLANT
CATH ROBINSON RED A/P 18FR (CATHETERS) ×2 IMPLANT
CLIP LIGATING EXTRA MED SLVR (CLIP) ×2 IMPLANT
CLIP LIGATING EXTRA SM BLUE (MISCELLANEOUS) ×2 IMPLANT
CLOTH BEACON ORANGE TIMEOUT ST (SAFETY) ×2 IMPLANT
CLSR STERI-STRIP ANTIMIC 1/2X4 (GAUZE/BANDAGES/DRESSINGS) ×1 IMPLANT
COVER SURGICAL LIGHT HANDLE (MISCELLANEOUS) ×4 IMPLANT
CRADLE DONUT ADULT HEAD (MISCELLANEOUS) ×2 IMPLANT
DECANTER SPIKE VIAL GLASS SM (MISCELLANEOUS) IMPLANT
DRAIN HEMOVAC 1/8 X 5 (WOUND CARE) IMPLANT
DRAPE WARM FLUID 44X44 (DRAPE) ×2 IMPLANT
DRSG COVADERM 4X8 (GAUZE/BANDAGES/DRESSINGS) ×2 IMPLANT
ELECT REM PT RETURN 9FT ADLT (ELECTROSURGICAL) ×2
ELECTRODE REM PT RTRN 9FT ADLT (ELECTROSURGICAL) ×1 IMPLANT
EVACUATOR SILICONE 100CC (DRAIN) IMPLANT
GEL ULTRASOUND 20GR AQUASONIC (MISCELLANEOUS) IMPLANT
GLOVE BIOGEL PI IND STRL 7.5 (GLOVE) ×1 IMPLANT
GLOVE BIOGEL PI INDICATOR 7.5 (GLOVE) ×1
GLOVE SS BIOGEL STRL SZ 7 (GLOVE) IMPLANT
GLOVE SS BIOGEL STRL SZ 7.5 (GLOVE) ×1 IMPLANT
GLOVE SUPERSENSE BIOGEL SZ 7 (GLOVE) ×1
GLOVE SUPERSENSE BIOGEL SZ 7.5 (GLOVE) ×1
GOWN STRL NON-REIN LRG LVL3 (GOWN DISPOSABLE) ×6 IMPLANT
KIT BASIN OR (CUSTOM PROCEDURE TRAY) ×2 IMPLANT
KIT ROOM TURNOVER OR (KITS) ×2 IMPLANT
NEEDLE 22X1 1/2 (OR ONLY) (NEEDLE) IMPLANT
NS IRRIG 1000ML POUR BTL (IV SOLUTION) ×4 IMPLANT
PACK CAROTID (CUSTOM PROCEDURE TRAY) ×2 IMPLANT
PAD ARMBOARD 7.5X6 YLW CONV (MISCELLANEOUS) ×4 IMPLANT
PATCH HEMASHIELD 8X75 (Vascular Products) ×1 IMPLANT
SHUNT CAROTID BYPASS 10 (VASCULAR PRODUCTS) IMPLANT
SHUNT CAROTID BYPASS 12 (VASCULAR PRODUCTS) ×2 IMPLANT
SHUNT CAROTID BYPASS 12FRX15.5 (VASCULAR PRODUCTS) IMPLANT
SPECIMEN JAR SMALL (MISCELLANEOUS) ×2 IMPLANT
STRIP CLOSURE SKIN 1/2X4 (GAUZE/BANDAGES/DRESSINGS) ×2 IMPLANT
SUT ETHILON 3 0 PS 1 (SUTURE) IMPLANT
SUT PROLENE 6 0 CC (SUTURE) ×4 IMPLANT
SUT VIC AB 3-0 SH 27 (SUTURE) ×4
SUT VIC AB 3-0 SH 27X BRD (SUTURE) ×2 IMPLANT
SUT VICRYL 4-0 PS2 18IN ABS (SUTURE) ×2 IMPLANT
SYR CONTROL 10ML LL (SYRINGE) IMPLANT
TOWEL OR 17X24 6PK STRL BLUE (TOWEL DISPOSABLE) ×2 IMPLANT
TOWEL OR 17X26 10 PK STRL BLUE (TOWEL DISPOSABLE) ×2 IMPLANT
WATER STERILE IRR 1000ML POUR (IV SOLUTION) ×2 IMPLANT

## 2011-11-12 NOTE — Interval H&P Note (Signed)
History and Physical Interval Note:  11/12/2011 10:41 AM  Dillon Young  has presented today for surgery, with the diagnosis of Right Internal Carotid Artery Stenosis  The various methods of treatment have been discussed with the patient and family. After consideration of risks, benefits and other options for treatment, the patient has consented to  Procedure(s) (LRB): ENDARTERECTOMY CAROTID (Right) as a surgical intervention .  The patients' history has been reviewed, patient examined, no change in status, stable for surgery.  I have reviewed the patients' chart and labs.  Questions were answered to the patient's satisfaction.     Ranetta Armacost

## 2011-11-12 NOTE — Anesthesia Procedure Notes (Signed)
Procedure Name: Intubation Date/Time: 11/12/2011 11:52 AM Performed by: Edmonia Caprio Pre-anesthesia Checklist: Patient identified, Timeout performed, Emergency Drugs available, Suction available and Patient being monitored Patient Re-evaluated:Patient Re-evaluated prior to inductionOxygen Delivery Method: Circle system utilized Preoxygenation: Pre-oxygenation with 100% oxygen Intubation Type: IV induction Ventilation: Oral airway inserted - appropriate to patient size and Mask ventilation without difficulty Laryngoscope Size: Mac and 3 Grade View: Grade I Tube type: Oral Tube size: 7.5 mm Number of attempts: 3 Airway Equipment and Method: Stylet Placement Confirmation: ETT inserted through vocal cords under direct vision,  breath sounds checked- equal and bilateral and positive ETCO2 Secured at: 23 cm Tube secured with: Tape Dental Injury: Teeth and Oropharynx as per pre-operative assessment  Comments: May, SRNA DL x 1 mac 3. Difficult to visualize anatomy. DL x 1 Miller 3. Difficult to view anatomy. Dr Jean Rosenthal DL x 1 mac 3. Grade I view. +ETCO2. +BBS. VSS.

## 2011-11-12 NOTE — Anesthesia Postprocedure Evaluation (Signed)
  Anesthesia Post-op Note  Patient: Dillon Young  Procedure(s) Performed: Procedure(s) (LRB): ENDARTERECTOMY CAROTID (Right)  Patient Location: PACU  Anesthesia Type: General  Level of Consciousness: awake, alert  and oriented  Airway and Oxygen Therapy: Patient Spontanous Breathing and Patient connected to nasal cannula oxygen  Post-op Pain: none  Post-op Assessment: Post-op Vital signs reviewed, Patient's Cardiovascular Status Stable, Respiratory Function Stable, Patent Airway, No signs of Nausea or vomiting and Pain level controlled  Post-op Vital Signs: Reviewed and stable  Complications: No apparent anesthesia complications

## 2011-11-12 NOTE — Anesthesia Preprocedure Evaluation (Signed)
Anesthesia Evaluation  Patient identified by MRN, date of birth, ID band Patient awake    Reviewed: Allergy & Precautions, H&P , NPO status , Patient's Chart, lab work & pertinent test results, reviewed documented beta blocker date and time   History of Anesthesia Complications Negative for: history of anesthetic complications  Airway Mallampati: II TM Distance: >3 FB Neck ROM: Full    Dental No notable dental hx. (+) Teeth Intact and Dental Advisory Given   Pulmonary COPDformer smoker (quit 2010) breath sounds clear to auscultation  Pulmonary exam normal       Cardiovascular hypertension, Pt. on medications and Pt. on home beta blockers + CAD (stent x2, '11 stress test: no ischemia, EF 60%, '12 ECHO: overall normal LVF, EF 55%, inferolateral hypokinesis) + dysrhythmias (occasional PVC) Rhythm:Regular Rate:Normal     Neuro/Psych negative neurological ROS     GI/Hepatic Neg liver ROS, GERD-  Controlled,  Endo/Other  Diabetes mellitus- (diet controlled, glu 126), Well Controlled  Renal/GU negative Renal ROS     Musculoskeletal   Abdominal (+) + obese,   Peds  Hematology   Anesthesia Other Findings   Reproductive/Obstetrics                           Anesthesia Physical Anesthesia Plan  ASA: III  Anesthesia Plan: General   Post-op Pain Management:    Induction: Intravenous  Airway Management Planned: Oral ETT  Additional Equipment: Arterial line  Intra-op Plan:   Post-operative Plan: Extubation in OR  Informed Consent: I have reviewed the patients History and Physical, chart, labs and discussed the procedure including the risks, benefits and alternatives for the proposed anesthesia with the patient or authorized representative who has indicated his/her understanding and acceptance.   Dental advisory given  Plan Discussed with: CRNA and Surgeon  Anesthesia Plan Comments: (Plan  routine monitors, A line, GETA)        Anesthesia Quick Evaluation

## 2011-11-12 NOTE — Op Note (Signed)
[facility]   Patient name: Dillon Young MRN: [id number] DOB: 08-08-1932 Sex: male  11/12/2011 Pre-operative Diagnosis: Asymptomatic right carotid stenosis Post-operative diagnosis:  Same Surgeon:  Larina Earthly, M.D. Assistants:  Thomasena Edis Procedure:    right carotid Endarterectomy with Dacron patch angioplasty Anesthesia:  General Blood Loss:  See anesthesia record Specimens:  Carotid Plaque to pathology  Indications for surgery:  Asymptomatic  Procedure in detail:  The patient was taken to the operating and placed in the supine position. The neck was prepped and draped in the usual sterile fashion. An incision was made anterior to the sternocleidomastoid muscle and continued with electrocautery through the platysma muscle. The muscle was retracted posteriorly and the carotid sheath was opened. The facial vein was ligated with 2-0 silk ties and divided. The common carotid artery was encircled with an umbilical tape and Rummel tourniquet. Dissection was continued onto the carotid bifurcation. The superior thyroid artery was controlled with a 2-0 silk Potts tie. The external carotid organ was encircled with a vessel loop and the internal carotid was encircled with umbilical tape and Rummel tourniquet. The hypoglossal and vagus nerves were identified and preserved.  The patient was given systemic heparinization. After adequate circulation time, the internal,external and common carotid arteries were occluded. The common carotid was opened with an 11 blade and the arteriotomy was continued with Potts scissors onto the internal carotid artery. A 10 shunt was passed up the internal carotid artery, allowed to back bleed, and then passed down the common carotid artery. The shunt was secured with Rummel tourniquet. The endarterectomy was begun on the common carotid artery  plaque was divided proximally with Potts scissors. The endarterectomy was continued onto the carotid  bifurcation. The external carotid was endarterectomized by eversion technique and the internal carotid artery was endarterectomized in an open fashion. Remaining debris was removed from the endarterectomy plane. A Dacron patch was brought to the field and sewn as a patch angioplasty. Prior to completing the anastomosis, the shunt was removed and the usual flushing maneuvers were undertaken. The anastomosis was then completed and flow was restored first to the external and then the internal carotid artery. Excellent flow characteristics were noted with hand-held Doppler in the internal and external carotid arteries.  The patient was given protamine to reverse the heparin. Hemostasis was obtained with electrocautery. The wounds were irrigated with saline. The wound was closed by first reapproximating the sternocleidomastoid muscle over the carotid artery with interrupted 3-0 Vicryl sutures. Next, the platysma was closed with a running 3-0 Vicryl suture. The skin was closed with a 4-0 subcuticular Vicryl suture. Benzoin and Steri-Strips were applied to the incision. A sterile dressing was placed over the incision. All sponge and needle counts were correct. The patient was awakened in the operating room, neurologically intact. They were transferred to the PACU in stable condition.   Disposition:  To PACU in stable condition,neurologically intact    Larina Earthly, M.D. [facility] [place] Office: [phone] Pager:  [phone]

## 2011-11-12 NOTE — H&P (View-Only) (Signed)
[facility] [place]   Patient name: Dillon Young MRN: [id number] DOB: Jul 04, 1932 Sex: male   Referred by: Alanda Amass  Reason for referral:  Chief Complaint  Patient presents with  . Carotid    NEW CAROTID  REFERRED BY DR Alanda Amass    HISTORY OF PRESENT ILLNESS: The patient is a very active healthy 78 year old gentleman who presents for evaluation of progressively severe asymptomatic right internal carotid artery stenosis. This is been followed for a number of years with serial carotid duplex evaluations. He has progressed to the critical level of stenosis on the right side. He does not have any severe stenosis on the left and remains in the 50-70% range. He does have occasional episodes of unsteady gait but no history of transient ischemic attack amaurosis fugax or stroke. He does have a remote history of cardiac coronary artery disease with stenting in the mid 1990s  Past Medical History  Diagnosis Date  . Allergic rhinitis   . Sleep apnea   . Smoker   . HTN (hypertension)   . CAD (coronary artery disease)   . CVA (cerebral infarction)   . Hyperlipidemia   . Diabetes mellitus   . GERD (gastroesophageal reflux disease)   . IBS (irritable bowel syndrome)   . Diverticulitis   . Hemorrhoids   . BPH (benign prostatic hyperplasia)   . Prostate cancer 2008    seed implantation  . Low back pain   . Adenomatous colon polyp     Past Surgical History  Procedure Date  . Colonoscopy 10/21/2008    diverticulosis, radiation proctitis, internal hemorrhoids  . Appendectomy   . Lumbar laminectomy   . Transurethral resection of prostate 10/2003    History   Social History  . Marital Status: Married    Spouse Name: N/A    Number of Children: 2  . Years of Education: N/A   Occupational History  . [employer]    Social History Main Topics  . Smoking status: Former Smoker -- 1.5 packs/day for 60 years    Types: Cigarettes    Quit date:  07/25/2008  . Smokeless tobacco: Never Used  . Alcohol Use: Yes     Occ  . Drug Use: No  . Sexually Active: Not on file   Other Topics Concern  . Not on file   Social History Narrative  . No narrative on file    Family History  Problem Relation Age of Onset  . Heart disease Father     Allergies as of 11/01/2011  . (No Known Allergies)    Current Outpatient Prescriptions on File Prior to Visit  Medication Sig Dispense Refill  . amLODipine (NORVASC) 10 MG tablet Take 10 mg by mouth daily.        Marland Kitchen aspirin 81 MG tablet Take 81 mg by mouth daily.        . cholecalciferol (VITAMIN D) 1000 UNITS tablet Take 1,000 Units by mouth daily.        Marland Kitchen dextromethorphan-guaiFENesin (MUCINEX DM) 30-600 MG per 12 hr tablet Take 1 tablet by mouth every 12 (twelve) hours as needed.       . ezetimibe (ZETIA) 10 MG tablet Take 10 mg by mouth daily.        . hydrocortisone (PROCTOSOL HC) 2.5 % rectal cream Place rectally 2 (two) times daily. Use as needed  30 g  2  . losartan-hydrochlorothiazide (HYZAAR) 100-12.5 MG per tablet Take 1 tablet by mouth daily.        Marland Kitchen  metoprolol (TOPROL-XL) 50 MG 24 hr tablet TAKE ONE-HALF (1/2) TABLET ONCE DAILY  90 tablet  0  . potassium chloride SA (K-DUR,KLOR-CON) 20 MEQ tablet Take 1/2 tablet by mouth once daily      . simvastatin (ZOCOR) 80 MG tablet Take 40 mg by mouth at bedtime.        . vitamin C (ASCORBIC ACID) 500 MG tablet Take 500 mg by mouth daily.        . Wheat Dextrin (BENEFIBER) POWD Take by mouth. Takes as directed       . zolpidem (AMBIEN) 10 MG tablet Take 1 tablet (10 mg total) by mouth at bedtime as needed.  30 tablet  5     REVIEW OF SYSTEMS:  Positives indicated with an "X"  CARDIOVASCULAR:  [ ]  chest pain   [ ]  chest pressure   [ ]  palpitations   [ ]  orthopnea   [ ]  dyspnea on exertion   [ ]  claudication   [ ]  rest pain   [ ]  DVT   [ ]  phlebitis PULMONARY:   [ ]  productive cough   [ ]  asthma   [ ]  wheezing NEUROLOGIC:   [ ]  weakness   [ ]  paresthesias  [ ]  aphasia  [ ]  amaurosis  [ ]  dizziness HEMATOLOGIC:   [ ]  bleeding problems   [ ]  clotting disorders MUSCULOSKELETAL:  [ ]  joint pain   [ ]  joint swelling GASTROINTESTINAL: [ ]   blood in stool  [ ]   hematemesis GENITOURINARY:  [ ]   dysuria  [ ]   hematuria PSYCHIATRIC:  [ ]  history of major depression INTEGUMENTARY:  [ ]  rashes  [ ]  ulcers CONSTITUTIONAL:  [ ]  fever   [ ]  chills  PHYSICAL EXAMINATION:  General: The patient is a well-nourished male, in no acute distress. Vital signs are BP 168/70  Pulse 68  Resp 20  Ht 5\' 9"  (1.753 m)  Wt 200 lb (90.719 kg)  BMI 29.53 kg/m2 Pulmonary: There is a good air exchange bilaterally without wheezing or rales. Abdomen: Soft and non-tender with normal pitch bowel sounds. No aneurysm palpable Musculoskeletal: There are no major deformities.  There is no significant extremity pain. Neurologic: No focal weakness or paresthesias are detected, Skin: There are no ulcer or rashes noted. Psychiatric: The patient has normal affect. Cardiovascular: There is a regular rate and rhythm without significant murmur appreciated. Pulse status: 2+ radial, 2+ femoral, 2+ popliteal, 2+ posterior tibial pulses bilaterally Carotid arteries: I do not hear bruit. He has normal 2+ carotid pulsations bilaterally [facility] Vascular Lab Studies:  Ordered and Independently Reviewed he did have a repeat of his right carotid duplex in our office to determine if he had normal internal carotid artery past his bifurcation. This did show 80% or greater stenosis with end-diastolic velocities of 107 cm/s. He did have taken to normal internal carotid artery past the bifurcation.  Impression and Plan:  Progressive severe asymptomatic right internal carotid artery stenosis. Had a very long talk with the patient and his wife present. I have recommended right carotid endarterectomy for reduction of stroke risk. I explained the procedure is a one night hospitalization  anticipated. I explained that this would be under general anesthesia. I explained to 1-2% risk of stroke with surgery and a very low risk for cranial nerve injury, bleeding or infection. I did explain potential for approximately 5% annual transient ischemic attack or stroke risk with his asymptomatic high-grade stenosis. He understands and wishes to proceed  we'll schedule this for June 17 with admission day surgery    Teige Rountree [facility] [place] Office: [phone]

## 2011-11-12 NOTE — Progress Notes (Signed)
Received report from Stephanie Knight RN 

## 2011-11-12 NOTE — Transfer of Care (Signed)
Immediate Anesthesia Transfer of Care Note  Patient: Dillon Young  Procedure(s) Performed: Procedure(s) (LRB): ENDARTERECTOMY CAROTID (Right)  Patient Location: PACU  Anesthesia Type: General  Level of Consciousness: awake, alert  and oriented  Airway & Oxygen Therapy: Patient Spontanous Breathing and Patient connected to nasal cannula oxygen  Post-op Assessment: Report given to PACU RN, Post -op Vital signs reviewed and stable and Patient moving all extremities  Post vital signs: Reviewed and stable  Complications: No apparent anesthesia complications

## 2011-11-13 ENCOUNTER — Encounter (HOSPITAL_COMMUNITY): Payer: Self-pay | Admitting: Pulmonary Disease

## 2011-11-13 ENCOUNTER — Encounter: Payer: Medicare Other | Admitting: Vascular Surgery

## 2011-11-13 ENCOUNTER — Telehealth: Payer: Self-pay | Admitting: Vascular Surgery

## 2011-11-13 LAB — BASIC METABOLIC PANEL
BUN: 22 mg/dL (ref 6–23)
CO2: 24 mEq/L (ref 19–32)
Chloride: 106 mEq/L (ref 96–112)
GFR calc Af Amer: 80 mL/min — ABNORMAL LOW (ref >90)
Potassium: 4 mEq/L (ref 3.5–5.1)

## 2011-11-13 LAB — CBC
HCT: 34.6 % — ABNORMAL LOW (ref 39.0–52.0)
RBC: 3.91 MIL/uL — ABNORMAL LOW (ref 4.22–5.81)
RDW: 13.3 % (ref 11.5–15.5)
WBC: 9.1 10*3/uL (ref 4.0–10.5)

## 2011-11-13 MED ORDER — OXYCODONE HCL 5 MG PO TABS: 5.0000 mg | ORAL_TABLET | Freq: Four times a day (QID) | ORAL | Status: AC | PRN

## 2011-11-13 MED ORDER — OXYCODONE HCL 5 MG PO TABS: 5.0000 mg | ORAL_TABLET | ORAL | Status: DC | PRN

## 2011-11-13 MED FILL — Hydromorphone HCl Inj 1 MG/ML: INTRAMUSCULAR | Qty: 1 | Status: AC

## 2011-11-13 NOTE — Progress Notes (Signed)
Utilization review completed.  

## 2011-11-13 NOTE — Telephone Encounter (Signed)
Message copied by Fredrich Birks on Tue Nov 13, 2011 11:35 AM ------      Message from: Melene Plan      Created: Tue Nov 13, 2011 11:11 AM       PER DR TFE 4 WEEKS IS FINE.HE WOULD RATHER SEE THIS HIMSELF      ----- Message -----         From: Fredrich Birks         Sent: 11/13/2011  11:01 AM           To: Melene Plan, RN            TFE is already overbooked several times on 07/02 and is on vacation in 3 wks. Ok to see NP?                  ----- Message -----         From: Melene Plan, RN         Sent: 11/13/2011   9:52 AM           To: Cheryll Dessert Pool                        ----- Message -----         From: Marlowe Shores, PA         Sent: 11/13/2011   8:00 AM           To: Melene Plan, RN            2 week F/U Dr. Loel Dubonnet - [facility]

## 2011-11-13 NOTE — Discharge Summary (Signed)
[facility] Discharge Summary   Patient ID:  Dillon Young MRN: [id number] DOB/AGE: February 26, 1933 78 y.o.  Admit date: 11/12/2011 Discharge date: 11/13/2011 Date of Surgery: 11/12/2011 Surgeon: Surgeon(s): Larina Earthly, MD  Admission Diagnosis: Right Internal Carotid Artery Stenosis  Discharge Diagnoses:  Right Internal Carotid Artery Stenosis  Secondary Diagnoses: Past Medical History  Diagnosis Date  . Allergic rhinitis   . Smoker   . HTN (hypertension)   . CAD (coronary artery disease)   . CVA (cerebral infarction)   . Hyperlipidemia   . IBS (irritable bowel syndrome)   . Diverticulitis   . Hemorrhoids   . BPH (benign prostatic hyperplasia)   . Prostate cancer 2008    seed implantation  . Low back pain   . Adenomatous colon polyp   . Sleep apnea     last study was aborted.  Has never worn a CPAP.    . Diabetes mellitus     "Borderline"  . Chronic kidney disease     Sones. Prostate cancer  . GERD (gastroesophageal reflux disease)     history    Procedure(s): ENDARTERECTOMY CAROTID  Discharged Condition: good  HPI: The patient is a very active healthy 78 year old gentleman who presents for evaluation of progressively severe asymptomatic right internal carotid artery stenosis. This is been followed for a number of years with serial carotid duplex evaluations. He has progressed to the critical level of stenosis on the right side. He does not have any severe stenosis on the left and remains in the 50-70% range. He does have occasional episodes of unsteady gait but no history of transient ischemic attack amaurosis fugax or stroke. He does have a remote history of cardiac coronary artery disease with stenting in the mid 1990s    Hospital Course:  Dillon Young is a 78 y.o. male is S/P Right Procedure(s): ENDARTERECTOMY CAROTID Extubated: POD # 0 Post-op wounds clean, dry, intact or healing well Pt. Ambulating, voiding and taking PO diet  without difficulty. Pt pain controlled with PO pain meds. Labs as below Complications:none  Consults:     Significant Diagnostic Studies: CBC Lab Results  Component Value Date   WBC 9.1 11/13/2011   HGB 11.7* 11/13/2011   HCT 34.6* 11/13/2011   MCV 88.5 11/13/2011   PLT 142* 11/13/2011    BMET    Component Value Date/Time   NA 141 11/13/2011 0330   K 4.0 11/13/2011 0330   CL 106 11/13/2011 0330   CO2 24 11/13/2011 0330   GLUCOSE 161* 11/13/2011 0330   BUN 22 11/13/2011 0330   CREATININE 1.02 11/13/2011 0330   CALCIUM 8.4 11/13/2011 0330   GFRNONAA 69* 11/13/2011 0330   GFRAA 80* 11/13/2011 0330   COAG Lab Results  Component Value Date   INR 1.04 11/07/2011     Disposition:  Discharge to :Home Discharge Orders    Future Appointments: Provider: Department: Dept Phone: Center:   05/06/2012 9:00 AM Michele Mcalpine, MD [facility] [phone] None     Future Orders Please Complete By Expires   Resume previous diet      Driving Restrictions      Comments:   No driving for 2 weeks   Lifting restrictions      Comments:   No lifting for 4 weeks   Call MD for:  temperature >100.5      Call MD for:  redness, tenderness, or signs of infection (pain, swelling, bleeding, redness, odor or green/yellow discharge around incision  site)      Call MD for:  severe or increased pain, loss or decreased feeling  in affected limb(s)      Increase activity slowly      Comments:   Walk with assistance use walker or cane as needed   May shower       Scheduling Instructions:   Wednesday   CAROTID Sugery: Call MD for difficulty swallowing or speaking; weakness in arms or legs that is a new symtom; severe headache.  If you have increased swelling in the neck and/or  are having difficulty breathing, CALL 911      No dressing needed      may wash over wound with mild soap and water         Dillon Young, Dillon Young  Home Medication Instructions [id number]   Printed on:11/13/11 0806  Medication  Information                    losartan-hydrochlorothiazide (HYZAAR) 100-12.5 MG per tablet Take 1 tablet by mouth daily.             potassium chloride SA (K-DUR,KLOR-CON) 20 MEQ tablet Take 10 mEq by mouth daily.            amLODipine (NORVASC) 10 MG tablet Take 10 mg by mouth daily.             vitamin C (ASCORBIC ACID) 500 MG tablet Take 500 mg by mouth daily.            cholecalciferol (VITAMIN D) 1000 UNITS tablet Take 1,000 Units by mouth daily.             ezetimibe (ZETIA) 10 MG tablet Take 10 mg by mouth daily.             aspirin EC 81 MG tablet Take 81 mg by mouth daily.           metoprolol succinate (TOPROL-XL) 50 MG 24 hr tablet Take 25 mg by mouth daily. Take with or immediately following a meal.           zolpidem (AMBIEN) 10 MG tablet Take 10 mg by mouth at bedtime as needed. For sleep.           simvastatin (ZOCOR) 40 MG tablet Take 40 mg by mouth at bedtime.           Wheat Dextrin (BENEFIBER) POWD Take 1 packet by mouth every morning.           oxyCODONE (OXY IR/ROXICODONE) 5 MG immediate release tablet Take 1-2 tablets (5-10 mg total) by mouth every 4 (four) hours as needed.            Verbal and written Discharge instructions given to the patient. Wound care per Discharge AVS Follow-up Information    Follow up with Nera Haworth, MD in 2 weeks. (office will arrange - sent)    Contact information:   [address] [place] [address] [phone]          Signed: Clinton Gallant [facility] 11/13/2011, 8:06 AM    I have examined the patient, reviewed and agree with above.  Salah Nakamura, MD 11/13/2011 8:53 AM

## 2011-11-13 NOTE — Discharge Summary (Signed)
. [facility] Discharge Summary   Patient ID:  Dillon Young MRN: [id number] DOB/AGE: [birth year] 78 y.o.  Admit date: 11/12/2011 Discharge date: 11/13/2011 Date of Surgery: 11/12/2011 Surgeon: Surgeon(s): Larina Earthly, MD  Admission Diagnosis: Right Internal Carotid Artery Stenosis  Discharge Diagnoses:  Right Internal Carotid Artery Stenosis  Secondary Diagnoses: Past Medical History  Diagnosis Date  . Allergic rhinitis   . Smoker   . HTN (hypertension)   . CAD (coronary artery disease)   . CVA (cerebral infarction)   . Hyperlipidemia   . IBS (irritable bowel syndrome)   . Diverticulitis   . Hemorrhoids   . BPH (benign prostatic hyperplasia)   . Prostate cancer 2008    seed implantation  . Low back pain   . Adenomatous colon polyp   . Sleep apnea     last study was aborted.  Has never worn a CPAP.    . Diabetes mellitus     "Borderline"  . Chronic kidney disease     Sones. Prostate cancer  . GERD (gastroesophageal reflux disease)     history    Procedure(s): ENDARTERECTOMY CAROTID  Discharged Condition: good  HPI:  The patient is a very active healthy 78 year old gentleman who presents for evaluation of progressively severe asymptomatic right internal carotid artery stenosis. This is been followed for a number of years with serial carotid duplex evaluations. He has progressed to the critical level of stenosis on the right side. He does not have any severe stenosis on the left and remains in the 50-70% range. He does have occasional episodes of unsteady gait but no history of transient ischemic attack amaurosis fugax or stroke. He does have a remote history of cardiac coronary artery disease with stenting in the mid 1990s  Pt. was admitted for Right Carotid Endarterectomy.  Hospital Course:  Dillon Young is a 78 y.o. male is S/P Right Procedure(s): ENDARTERECTOMY CAROTID Extubated: POD # 0 Post-op wounds healing well Pt. Ambulating,  voiding and taking PO diet without difficulty. Pt pain controlled with PO pain meds. Labs as below Complications:none  Consults:     Significant Diagnostic Studies: CBC Lab Results  Component Value Date   WBC 9.1 11/13/2011   HGB 11.7* 11/13/2011   HCT 34.6* 11/13/2011   MCV 88.5 11/13/2011   PLT 142* 11/13/2011    BMET    Component Value Date/Time   NA 141 11/13/2011 0330   K 4.0 11/13/2011 0330   CL 106 11/13/2011 0330   CO2 24 11/13/2011 0330   GLUCOSE 161* 11/13/2011 0330   BUN 22 11/13/2011 0330   CREATININE 1.02 11/13/2011 0330   CALCIUM 8.4 11/13/2011 0330   GFRNONAA 69* 11/13/2011 0330   GFRAA 80* 11/13/2011 0330   COAG Lab Results  Component Value Date   INR 1.04 11/07/2011     Disposition:  Discharge to :Home Discharge Orders    Future Appointments: Provider: Department: Dept Phone: Center:   05/06/2012 9:00 AM Michele Mcalpine, MD [facility] [phone] None     Future Orders Please Complete By Expires   Resume previous diet      Driving Restrictions      Comments:   No driving for 2 weeks   Lifting restrictions      Comments:   No lifting for 4 weeks   Call MD for:  temperature >100.5      Call MD for:  redness, tenderness, or signs of infection (pain, swelling, bleeding, redness, odor or  green/yellow discharge around incision site)      Call MD for:  severe or increased pain, loss or decreased feeling  in affected limb(s)      Increase activity slowly      Comments:   Walk with assistance use walker or cane as needed   May shower       Scheduling Instructions:   Wednesday   CAROTID Sugery: Call MD for difficulty swallowing or speaking; weakness in arms or legs that is a new symtom; severe headache.  If you have increased swelling in the neck and/or  are having difficulty breathing, CALL 911      No dressing needed      may wash over wound with mild soap and water         Dillon, Young  Home Medication Instructions [id number]   Printed  on:11/13/11 0801  Medication Information                    losartan-hydrochlorothiazide (HYZAAR) 100-12.5 MG per tablet Take 1 tablet by mouth daily.             potassium chloride SA (K-DUR,KLOR-CON) 20 MEQ tablet Take 10 mEq by mouth daily.            amLODipine (NORVASC) 10 MG tablet Take 10 mg by mouth daily.             vitamin C (ASCORBIC ACID) 500 MG tablet Take 500 mg by mouth daily.            cholecalciferol (VITAMIN D) 1000 UNITS tablet Take 1,000 Units by mouth daily.             ezetimibe (ZETIA) 10 MG tablet Take 10 mg by mouth daily.             aspirin EC 81 MG tablet Take 81 mg by mouth daily.           metoprolol succinate (TOPROL-XL) 50 MG 24 hr tablet Take 25 mg by mouth daily. Take with or immediately following a meal.           zolpidem (AMBIEN) 10 MG tablet Take 10 mg by mouth at bedtime as needed. For sleep.           simvastatin (ZOCOR) 40 MG tablet Take 40 mg by mouth at bedtime.           Wheat Dextrin (BENEFIBER) POWD Take 1 packet by mouth every morning.           oxyCODONE (OXY IR/ROXICODONE) 5 MG immediate release tablet Take 1-2 tablets (5-10 mg total) by mouth every 4 (four) hours as needed.            Verbal and written Discharge instructions given to the patient. Wound care per Discharge AVS Follow-up Information    Follow up with Criselda Starke, MD in 2 weeks. (office will arrange - sent)    Contact information:   [address] [place] [address] [phone]          Signed: Marlowe Shores 11/13/2011, 8:01 AM    I have examined the patient, reviewed and agree with above.  Blakelee Allington, MD 11/13/2011 8:53 AM

## 2011-11-13 NOTE — Telephone Encounter (Signed)
Spoke with patient to notify of appt, also sent letter, dpm

## 2011-11-13 NOTE — Progress Notes (Signed)
11/13/2011 10:25 AM Patient's discharge instructions reviewed with patient and patient's wife.  Both stated understanding.  IV discontinued without difficulty.  Patient ready for discharge.  Jaclyn Shaggy Everhart

## 2011-11-13 NOTE — Care Management Note (Signed)
    Page 1 of 1   11/13/2011     10:28:50 AM   CARE MANAGEMENT NOTE 11/13/2011  Patient:  Dillon Young, Dillon Young   Account Number:  [id number]  Date Initiated:  11/13/2011  Documentation initiated by:  Donn Pierini  Subjective/Objective Assessment:   Pt admitted s/p carotid     Action/Plan:   PTA pt lived at home with spouse, was independent with ADLs   Anticipated DC Date:  11/13/2011   Anticipated DC Plan:  HOME/SELF CARE      DC Planning Services  CM consult      Choice offered to / List presented to:             Status of service:  Completed, signed off Medicare Important Message given?   (If response is "NO", the following Medicare IM given date fields will be blank) Date Medicare IM given:   Date Additional Medicare IM given:    Discharge Disposition:  HOME/SELF CARE  Per UR Regulation:  Reviewed for med. necessity/level of care/duration of stay  If discussed at Long Length of Stay Meetings, dates discussed:    Comments:  PCP- Alanda Amass  11/13/11- 1025- Donn Pierini RN, BSN [phone] UR completed, pt to discharge  home today, no d/c needs noted.

## 2011-11-13 NOTE — Discharge Instructions (Signed)
Stroke Prevention Some medical conditions and some behaviors are associated with an increased chance of having a stroke. You may prevent a stroke by making healthy choices and managing medical conditions. You can reduce your risk of having a stroke by:  Staying physically active. It is recommended that you get at least 30 minutes of activity on most or all days.   Stop smoking.   Limiting alcohol use. Moderate alcohol use is considered to be: No more than 2 drinks per day for men. No more than 1 drink per day for non-pregnant women.   DIET.  A low fat, low cholesterol, low salt and high fiber diet with servings of fruits and vegetables daily will help .   Managing your cholesterol levels.. Take any prescribed medications to control cholesterol as directed by your caregiver.   Managing your diabetes. Diabetic diet as recommended by the ADA. Take any prescribed medications to control diabetes as directed by your caregiver.   Controlling your high blood pressure (hypertension). It is very important to take any prescribed medications to control hypertension .   Aspirin: Take Aspirin daily as prescribed by your physician. Do not take aspirin with blood thinners unless prescribed by your Physician  You may be on "blood thinners" (anticoagulants) Coumadin, Plavix, Agrennox, Effient. All these medications are helpful in reducing the risk of forming abnormal blood clots. If you have the irregular heart rhythm of atrial fibrillation, you may be on a blood thinner.  Be sure you understand all your medication instructions.   SIGNS OF STROKE: SEEK IMMEDIATE MEDICAL CARE IF: You have sudden weakness or numbness of the face, arm, or leg, especially on 1 side of the body.  You have sudden confusion.  You have trouble speaking (aphasia) or understanding.  You have sudden trouble seeing in 1 or both eyes.  You have sudden trouble walking.  You have dizziness.  You have a loss of balance or coordination.   You have a sudden severe headache with no known cause.    You have new chest pain, angina, or an irregular heartbeat.   ANY OF THESE SYMPTOMS MAY REPRESENT A SERIOUS PROBLEM THAT IS AN EMERGENCY. Do not wait to see if the symptoms will go away. Get medical help right away. Call your local emergency services (911 in [place] DO NOT drive yourself to the hospital. Stroke Prevention Some medical conditions and some behaviors are associated with an increased chance of having a stroke. You may prevent a stroke by making healthy choices and managing medical conditions. You can reduce your risk of having a stroke by:  Staying physically active. It is recommended that you get at least 30 minutes of activity on most or all days.   Not smoking.   Limiting alcohol use. Moderate alcohol use is considered to be:   No more than 2 drinks per day for men.   No more than 1 drink per day for nonpregnant women.   Eating healthy foods. Certain diets may be prescribed to address high blood pressure, high cholesterol, diabetes, or obesity. A diet that includes 5 or more servings of fruits and vegetables a day may reduce the risk of stroke.   Managing your cholesterol levels. A low saturated fat, low trans fat, low cholesterol, and high fiber diet may control cholesterol levels. Take any prescribed medications to control cholesterol as directed by your caregiver.   Managing your diabetes. A controlled carbohydrate, controlled sugar diet is recommended to manage diabetes. Take any prescribed medications to   control diabetes as directed by your caregiver.   Controlling your high blood pressure (hypertension). A low salt (sodium), low saturated fat, low trans fat, and low cholesterol diet is recommended to manage high blood pressure. Take any prescribed medications to control hypertension as directed by your caregiver.   Maintaining a healthy weight. A reduced calorie, low sodium, low saturated fat, low trans fat, low  cholesterol diet is recommended to manage weight.   Stopping drug abuse.   Taking medications as directed by your caregiver. For some individuals, aspirin or "blood thinners" (anticoagulants) are helpful in reducing the risk of forming abnormal blood clots that can lead to stroke. If you have the irregular heart rhythm of atrial fibrillation, you should be on a blood thinner unless there is a good reason you cannot take them. Be sure you understand all your medication instructions.  SEEK IMMEDIATE MEDICAL CARE IF:  You have sudden weakness or numbness of the face, arm, or leg, especially on 1 side of the body.   You have sudden confusion.   You have trouble speaking (aphasia) or understanding.   You have sudden trouble seeing in 1 or both eyes.   You have sudden trouble walking.   You have a loss of balance or coordination.   You have a sudden severe headache with no known cause.    ANY OF THESE SYMPTOMS MAY REPRESENT A SERIOUS PROBLEM THAT IS AN EMERGENCY. Do not wait to see if the symptoms will go away. Get medical help right away. Call your local emergency services (911 in [place] DO NOT drive yourself to the hospital. Document Released: 06/21/2004 Document Re-Released: 11/01/2009 [employer] Patient Information 2011 [employer].  

## 2011-12-10 ENCOUNTER — Encounter: Payer: Self-pay | Admitting: Vascular Surgery

## 2011-12-11 ENCOUNTER — Encounter: Payer: Self-pay | Admitting: Vascular Surgery

## 2011-12-11 ENCOUNTER — Ambulatory Visit (INDEPENDENT_AMBULATORY_CARE_PROVIDER_SITE_OTHER): Payer: Medicare Other | Admitting: Vascular Surgery

## 2011-12-11 VITALS — BP 141/56 | HR 53 | Temp 98.0°F | Ht 69.0 in | Wt 200.0 lb

## 2011-12-11 DIAGNOSIS — Z48812 Encounter for surgical aftercare following surgery on the circulatory system: Secondary | ICD-10-CM

## 2011-12-11 DIAGNOSIS — I6529 Occlusion and stenosis of unspecified carotid artery: Secondary | ICD-10-CM

## 2011-12-11 NOTE — Progress Notes (Signed)
The patient is a for followup of right carotid endarterectomy for asymptomatic disease on 11/12/2011. He did well was discharged home on postoperative day #1. He's had uneventful recovery with the usual amount of peri-incisional numbness. He has had no neurologic deficits.  Physical exam reveals a well-healed incision with no bruits bilaterally. He is grossly intact neurologically  Impression and plan stable status post right carotid endarterectomy and Dacron patch angioplasty. He will continue his full activities without limitations. We will see him again in 6 months with repeat carotid duplex for followup of his endarterectomy

## 2012-02-04 ENCOUNTER — Telehealth: Payer: Self-pay | Admitting: Pulmonary Disease

## 2012-02-04 NOTE — Telephone Encounter (Signed)
Error

## 2012-02-18 ENCOUNTER — Emergency Department (HOSPITAL_COMMUNITY): Payer: Medicare Other

## 2012-02-18 ENCOUNTER — Emergency Department (HOSPITAL_COMMUNITY)
Admission: EM | Admit: 2012-02-18 | Discharge: 2012-02-19 | Disposition: A | Discharge status: Home / Self Care | Payer: Medicare Other | Attending: Emergency Medicine | Admitting: Emergency Medicine

## 2012-02-18 ENCOUNTER — Other Ambulatory Visit: Payer: Self-pay

## 2012-02-18 DIAGNOSIS — Z8545 Personal history of malignant neoplasm of unspecified male genital organ: Secondary | ICD-10-CM | POA: Insufficient documentation

## 2012-02-18 DIAGNOSIS — N400 Enlarged prostate without lower urinary tract symptoms: Secondary | ICD-10-CM | POA: Insufficient documentation

## 2012-02-18 DIAGNOSIS — Z87891 Personal history of nicotine dependence: Secondary | ICD-10-CM | POA: Insufficient documentation

## 2012-02-18 DIAGNOSIS — I2510 Atherosclerotic heart disease of native coronary artery without angina pectoris: Secondary | ICD-10-CM | POA: Insufficient documentation

## 2012-02-18 DIAGNOSIS — Z86718 Personal history of other venous thrombosis and embolism: Secondary | ICD-10-CM | POA: Insufficient documentation

## 2012-02-18 DIAGNOSIS — R079 Chest pain, unspecified: Secondary | ICD-10-CM | POA: Insufficient documentation

## 2012-02-18 DIAGNOSIS — I10 Essential (primary) hypertension: Secondary | ICD-10-CM | POA: Insufficient documentation

## 2012-02-18 DIAGNOSIS — Z860100 Personal history of colon polyps, unspecified: Secondary | ICD-10-CM | POA: Insufficient documentation

## 2012-02-18 DIAGNOSIS — Z7982 Long term (current) use of aspirin: Secondary | ICD-10-CM | POA: Insufficient documentation

## 2012-02-18 DIAGNOSIS — Z9849 Cataract extraction status, unspecified eye: Secondary | ICD-10-CM | POA: Insufficient documentation

## 2012-02-18 DIAGNOSIS — E785 Hyperlipidemia, unspecified: Secondary | ICD-10-CM | POA: Insufficient documentation

## 2012-02-18 DIAGNOSIS — Z9089 Acquired absence of other organs: Secondary | ICD-10-CM | POA: Insufficient documentation

## 2012-02-18 DIAGNOSIS — E119 Type 2 diabetes mellitus without complications: Secondary | ICD-10-CM | POA: Insufficient documentation

## 2012-02-18 LAB — CBC
HCT: 38.1 % — ABNORMAL LOW (ref 39.0–52.0)
MCV: 86.8 fL (ref 78.0–100.0)
Platelets: 176 10*3/uL (ref 150–400)
RBC: 4.39 MIL/uL (ref 4.22–5.81)
WBC: 7.5 10*3/uL (ref 4.0–10.5)

## 2012-02-18 LAB — BASIC METABOLIC PANEL
BUN: 23 mg/dL (ref 6–23)
CO2: 28 mEq/L (ref 19–32)
Chloride: 101 mEq/L (ref 96–112)
Creatinine, Ser: 1.16 mg/dL (ref 0.50–1.35)
Potassium: 3.9 mEq/L (ref 3.5–5.1)

## 2012-02-18 LAB — TROPONIN I
Troponin I: <0.3 ng/mL (ref <0.30)
Troponin I: <0.3 ng/mL (ref <0.30)

## 2012-02-18 NOTE — ED Provider Notes (Signed)
History     CSN: [id number]  Arrival date & time 02/18/12  1854   First MD Initiated Contact with Patient 02/18/12 2030      Chief Complaint  Patient presents with  . Chest Pain   HPI  History provided by the patient and wife. Patient is a 78 year old male with history of hypertension, hypercholesterolemia, diabetes, CAD, CVA and chronic kidney disease who presents with complaints of upper chest pressure and tightness. Patient states that he was working earlier today. 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 Patient states that when he ended his day around 3:30 he was walking to his car and began to feel symptoms of upper chest tightness and pressure. Pain symptoms radiated into bilateral shoulders. Patient also reports a general heaviness over these areas of his chest and shoulders. Patient denies any sudden diaphoresis or nausea symptoms. He returned home and did take 2 baby aspirin this. Symptoms began to ease off and lasted approximately 3 hours. Currently patient reports feeling much better. Patient does have history of 2 coronary stents placed in 1995. He has been without complications since that time. Patient is not take any other blood thinners besides aspirin. Patient has been taking medications normally. Patient does report recently starting Vesicare for bladder spasm symptoms 2 weeks ago. He does report having some symptoms of feeling a little uneasy since starting this medicine for never having chest pain symptoms like today. He denies any other symptoms. Denies any recent illnesses. No fever, chills or sweats. No abdominal pain.   Past Medical History  Diagnosis Date  . Allergic rhinitis   . Smoker   . HTN (hypertension)   . CAD (coronary artery disease)   . CVA (cerebral infarction)   . Hyperlipidemia   . IBS (irritable bowel syndrome)   . Diverticulitis   . Hemorrhoids   . BPH (benign prostatic hyperplasia)   . Prostate  cancer 2008    seed implantation  . Low back pain   . Adenomatous colon polyp   . Sleep apnea     last study was aborted.  Has never worn a CPAP.    . Diabetes mellitus     "Borderline"  . Chronic kidney disease     Sones. Prostate cancer  . GERD (gastroesophageal reflux disease)     history    Past Surgical History  Procedure Date  . Colonoscopy 10/21/2008    diverticulosis, radiation proctitis, internal hemorrhoids  . Appendectomy   . Lumbar laminectomy   . Transurethral resection of prostate 10/2003  . Cardiac catheterization   . Coronary stents   . Lithrotripsy      x 3  . Esophageal dilitation   . Cataract with lens     Bilateral  . Endarterectomy 11/12/2011    Procedure: ENDARTERECTOMY CAROTID;  Surgeon: Larina Earthly, MD;  Location: [facility] OR;  Service: Vascular;  Laterality: Right;    Family History  Problem Relation Age of Onset  . Heart disease Father     History  Substance Use Topics  . Smoking status: Former Smoker -- 1.5 packs/day for 60 years    Types: Cigarettes    Quit date: 07/25/2008  . Smokeless tobacco: Never Used  . Alcohol Use: 2.0 oz/week    4 drink(s) per week     Occ      Review of Systems  Constitutional: Negative for fever, chills and diaphoresis.  HENT: Negative for neck pain.  Respiratory: Negative for cough and shortness of breath.   Cardiovascular: Positive for chest pain. Negative for palpitations and leg swelling.  Gastrointestinal: Negative for nausea, vomiting, abdominal pain, diarrhea and constipation.  Musculoskeletal: Negative for back pain.    Allergies  Review of patient's allergies indicates no known allergies.  Home Medications   Current Outpatient Rx  Name Route Sig Dispense Refill  . AMLODIPINE BESYLATE 10 MG PO TABS Oral Take 10 mg by mouth daily.      . ASPIRIN EC 81 MG PO TBEC Oral Take 81 mg by mouth daily.    Marland Kitchen VITAMIN D 1000 UNITS PO TABS Oral Take 1,000 Units by mouth daily.      Marland Kitchen EZETIMIBE 10 MG PO TABS  Oral Take 10 mg by mouth daily.      Marland Kitchen LOSARTAN POTASSIUM-HCTZ 100-25 MG PO TABS Oral Take 1 tablet by mouth daily.     Marland Kitchen METOPROLOL SUCCINATE ER 50 MG PO TB24 Oral Take 25 mg by mouth daily. Take with or immediately following a meal.    . POTASSIUM CHLORIDE CRYS ER 20 MEQ PO TBCR Oral Take 10 mEq by mouth daily.     Marland Kitchen SIMVASTATIN 40 MG PO TABS Oral Take 40 mg by mouth at bedtime.    . SOLIFENACIN SUCCINATE 10 MG PO TABS Oral Take 10 mg by mouth daily.    Marland Kitchen VITAMIN C 500 MG PO TABS Oral Take 500 mg by mouth daily.     . BENEFIBER PO POWD Oral Take 1 packet by mouth every morning.    Marland Kitchen ZOLPIDEM TARTRATE 10 MG PO TABS Oral Take 10 mg by mouth at bedtime as needed. For sleep.      BP 169/52  Pulse 66  Temp 97.5 F (36.4 C) (Oral)  Resp 16  Ht 5\' 9"  (1.753 m)  Wt 204 lb 3.2 oz (92.625 kg)  BMI 30.16 kg/m2  Physical Exam  Nursing note and vitals reviewed. Constitutional: He is oriented to person, place, and time. He appears well-developed and well-nourished. No distress.  HENT:  Head: Normocephalic.  Neck: Normal range of motion. No JVD present.  Cardiovascular: Normal rate and regular rhythm.   No murmur heard. Pulmonary/Chest: Effort normal and breath sounds normal. No respiratory distress. He has no wheezes. He has no rales. He exhibits no tenderness.  Abdominal: Soft. There is no tenderness. There is no rebound.  Musculoskeletal: Normal range of motion. He exhibits no edema and no tenderness.  Neurological: He is alert and oriented to person, place, and time.  Skin: Skin is warm.  Psychiatric: He has a normal mood and affect. His behavior is normal.    ED Course  Procedures   Results for orders placed during the hospital encounter of 02/18/12  TROPONIN I      Component Value Range   Troponin I <0.30  <0.30 ng/mL  CBC      Component Value Range   WBC 7.5  4.0 - 10.5 K/uL   RBC 4.39  4.22 - 5.81 MIL/uL   Hemoglobin 12.9 (*) 13.0 - 17.0 g/dL   HCT 96.0 (*) 45.4 - 09.8 %    MCV 86.8  78.0 - 100.0 fL   MCH 29.4  26.0 - 34.0 pg   MCHC 33.9  30.0 - 36.0 g/dL   RDW 11.9  14.7 - 82.9 %   Platelets 176  150 - 400 K/uL  BASIC METABOLIC PANEL      Component Value Range   Sodium 139  135 - 145 mEq/L   Potassium 3.9  3.5 - 5.1 mEq/L   Chloride 101  96 - 112 mEq/L   CO2 28  19 - 32 mEq/L   Glucose, Bld 109 (*) 70 - 99 mg/dL   BUN 23  6 - 23 mg/dL   Creatinine, Ser 9.81  0.50 - 1.35 mg/dL   Calcium 9.9  8.4 - 19.1 mg/dL   GFR calc non Af Amer 59 (*) >90 mL/min   GFR calc Af Amer 68 (*) >90 mL/min      Dg Chest 2 View  02/18/2012  *RADIOLOGY REPORT*  Clinical Data: Chest pain.  CHEST - 2 VIEW  Comparison: 05/07/2011.  Findings:  Cardiopericardial silhouette within normal limits. Mediastinal contours normal. Trachea midline.  No airspace disease or effusion.  Mild basilar atelectasis is present.  IMPRESSION: No active cardiopulmonary disease.  No interval change.   Original Report Authenticated By: Andreas Newport, M.D.      1. Chest pain       MDM  8:35 PM patient seen and evaluated. Patient currently reports improvement of chest tightness symptoms. Patient appears comfortable. No acute distress.  Patient continues to do well. Patient discussed with attending physician. Given patient's prior CAD history stents as well as concerning symptoms today we'll call the hospitalist for consult and admission.   Spoke with [facility] hospitalist. They will see patient and admit.  Patient now decided he is not wish to stay for further evaluation. He will plan to call his doctor in the morning. Patient given strict return precautions. Patient also advised of the risks of leaving, including heart attack and death.  He expresses understanding and still wished to leave.   Date: 02/18/2012  Rate: 82  Rhythm: normal sinus rhythm and premature ventricular contractions (PVC)  QRS Axis: normal  Intervals: normal  ST/T Wave abnormalities: nonspecific ST/T changes  Conduction  Disutrbances:first-degree A-V block   Narrative Interpretation: Ventricular Bigeminy  Old EKG Reviewed: changes noted ventricular bigeminy new from 10/03/2007    Angus Seller, PA 02/18/12 2309  Angus Seller, PA 02/19/12 0004

## 2012-02-18 NOTE — ED Notes (Addendum)
Pt c/o chest pressure three hours ago with left arm numbness. Pt denies shortness of breath, diaphoresis. Pt reports minor headache with the chest pain. Pt denies chest pain that this time.

## 2012-02-19 NOTE — ED Provider Notes (Signed)
History/physical exam/procedure(s) were performed by non-physician practitioner and as supervising physician I was immediately available for consultation/collaboration. I have reviewed all notes and am in agreement with care and plan. Patient left ama prior to my physical exam.   Hilario Quarry, MD 02/19/12 [phone]

## 2012-05-06 ENCOUNTER — Ambulatory Visit (INDEPENDENT_AMBULATORY_CARE_PROVIDER_SITE_OTHER): Payer: Medicare Other | Admitting: Pulmonary Disease

## 2012-05-06 ENCOUNTER — Encounter: Payer: Self-pay | Admitting: Pulmonary Disease

## 2012-05-06 ENCOUNTER — Other Ambulatory Visit (INDEPENDENT_AMBULATORY_CARE_PROVIDER_SITE_OTHER): Payer: Medicare Other

## 2012-05-06 VITALS — BP 128/62 | HR 41 | Temp 96.7°F | Ht 69.5 in | Wt 203.6 lb

## 2012-05-06 DIAGNOSIS — R7309 Other abnormal glucose: Secondary | ICD-10-CM

## 2012-05-06 DIAGNOSIS — J309 Allergic rhinitis, unspecified: Secondary | ICD-10-CM

## 2012-05-06 DIAGNOSIS — N138 Other obstructive and reflux uropathy: Secondary | ICD-10-CM

## 2012-05-06 DIAGNOSIS — E785 Hyperlipidemia, unspecified: Secondary | ICD-10-CM

## 2012-05-06 DIAGNOSIS — I2510 Atherosclerotic heart disease of native coronary artery without angina pectoris: Secondary | ICD-10-CM

## 2012-05-06 DIAGNOSIS — C61 Malignant neoplasm of prostate: Secondary | ICD-10-CM

## 2012-05-06 DIAGNOSIS — I6529 Occlusion and stenosis of unspecified carotid artery: Secondary | ICD-10-CM

## 2012-05-06 DIAGNOSIS — K219 Gastro-esophageal reflux disease without esophagitis: Secondary | ICD-10-CM

## 2012-05-06 DIAGNOSIS — N401 Enlarged prostate with lower urinary tract symptoms: Secondary | ICD-10-CM

## 2012-05-06 DIAGNOSIS — R351 Nocturia: Secondary | ICD-10-CM

## 2012-05-06 DIAGNOSIS — I493 Ventricular premature depolarization: Secondary | ICD-10-CM

## 2012-05-06 DIAGNOSIS — M545 Low back pain, unspecified: Secondary | ICD-10-CM

## 2012-05-06 DIAGNOSIS — F411 Generalized anxiety disorder: Secondary | ICD-10-CM

## 2012-05-06 DIAGNOSIS — K627 Radiation proctitis: Secondary | ICD-10-CM

## 2012-05-06 DIAGNOSIS — K589 Irritable bowel syndrome without diarrhea: Secondary | ICD-10-CM

## 2012-05-06 DIAGNOSIS — I10 Essential (primary) hypertension: Secondary | ICD-10-CM

## 2012-05-06 DIAGNOSIS — F419 Anxiety disorder, unspecified: Secondary | ICD-10-CM

## 2012-05-06 DIAGNOSIS — I4949 Other premature depolarization: Secondary | ICD-10-CM

## 2012-05-06 DIAGNOSIS — Z23 Encounter for immunization: Secondary | ICD-10-CM

## 2012-05-06 DIAGNOSIS — I679 Cerebrovascular disease, unspecified: Secondary | ICD-10-CM

## 2012-05-06 LAB — HEPATIC FUNCTION PANEL
ALT: 28 U/L (ref 0–53)
AST: 23 U/L (ref 0–37)
Alkaline Phosphatase: 48 U/L (ref 39–117)
Bilirubin, Direct: 0.1 mg/dL (ref 0.0–0.3)
Total Bilirubin: 1 mg/dL (ref 0.3–1.2)
Total Protein: 7.4 g/dL (ref 6.0–8.3)

## 2012-05-06 LAB — BASIC METABOLIC PANEL
CO2: 30 mEq/L (ref 19–32)
Chloride: 103 mEq/L (ref 96–112)
Creatinine, Ser: 1.3 mg/dL (ref 0.4–1.5)
Potassium: 4.4 mEq/L (ref 3.5–5.1)

## 2012-05-06 LAB — CBC WITH DIFFERENTIAL/PLATELET
Basophils Absolute: 0 10*3/uL (ref 0.0–0.1)
Eosinophils Absolute: 0.1 10*3/uL (ref 0.0–0.7)
Lymphocytes Relative: 23 % (ref 12.0–46.0)
MCHC: 33.2 g/dL (ref 30.0–36.0)
MCV: 87.6 fl (ref 78.0–100.0)
Monocytes Absolute: 0.6 10*3/uL (ref 0.1–1.0)
Neutrophils Relative %: 64.2 % (ref 43.0–77.0)
Platelets: 211 10*3/uL (ref 150.0–400.0)
RBC: 4.81 Mil/uL (ref 4.22–5.81)
RDW: 13.7 % (ref 11.5–14.6)

## 2012-05-06 MED ORDER — IPRATROPIUM BROMIDE 0.03 % NA SOLN: 2.0000 | Freq: Three times a day (TID) | NASAL | Status: DC | PRN

## 2012-05-06 NOTE — Patient Instructions (Addendum)
Today we updated your med list in our EPIC system...    Continue your current medications the same...  We decided to try ATROVENT nasal spray- 2sp in each nostril up to 3 times daily as needed for dripping nose...  We also gave you the combibation Tetanus vaccine called the TDAP today (it should be good for 10 yrs)...  Today we did your follow up FASTING blood work...    We will contact you w/ the results when avail...  We will set up an appt for you to see DrWrenn about your nocturia problem...  Call for any questions, or if we can be of service in any way.Marland KitchenMarland Kitchen

## 2012-05-06 NOTE — Progress Notes (Signed)
Subjective:    Patient ID: Dillon Young, male    DOB: 11/26/1932, 78 y.o.   MRN: [id number]  HPI 78 y/o WM here for a follow up visit... he has multiple medical problems as noted below...  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   ~  Apr10:   I last saw him Oct07 for a yearly follow up at that time- see problem list below... states he's doing well- no complaints... he states he quit smoking 2/29/10, and has been exercising- walking for 45 min 3x per week... he has noted a cough on the Lotensin therapy & we will need to change this med... he is also c/o some insomnia and wants a sleeping pill...  ~  July 12, 2009:  his BP was elevated & DrWeintraub placed him on HCT 25mg  +KCl daily... he states that DrRW did his Chol check as well & the pt reports that it was "OK" on Simva40 + Zetia10... he had the seasonal flu vaccine in 2010 & he had a follow up colonoscopy by DrGessner- no recurrent polyps... he has had several visits w/ DrBates for ENT- following his earaches & hearing...  ~  February 08, 2010:  41mo ROV- doing reasonably well but notes some fatigue & we discussed checking CXR (NAD) & fasting blood work (see below)... he notes tinnitus & we discussed Rx w/ background sounds to mask, consider f/u ENT if not helping... he saw DrWeintraub 3/11 for Cards f/u- doing well & no changes made (he noted some leg cramps & OK to decr HCTZ to 12.5mg  if nec)...  ~  May 07, 2011:  29mo & he reports that "everything is good" noting some ven insuffic, min edema, minor nocturnal leg cramps & we reviewed so salt, leg elevation, support hose & tonic water vs mustard Qhs prn...     He saw [facility] ENT 8/12 for f/u on-going prob w/ ear wax, eustachian tube dysfunction, & turbinate hypertrophy...    His last f/u w/ DrRW was 15/12 for HBP, Chol, CAD w/ prev PCI- waking regularly, no angina, & rec to continue current medical management;  2DEcho 5/12  showed mild concLVH, normLVF w/ EF=>55% but some HK inferolaterally, mild LAdil, no signif valv abn.    He saw DrGessner for GI 11/12 for constip & prolapsing int hems> improved w/ incr fiber & AnusolHC cream- Note: hems injected 4/12 by DrHIngram...    We did f/u CXR (COPD, boderline heart size, NAD);  EKG w/ bigeminy & no palpit/ dizzy/ SOB/ etc;  Labs all look good on his Simva40, Zetia10, & diet management (see below)...  ~  May 06, 2012:  Yearly ROV & Shamus has had a good year overall but notes 2 problems- sinus drainage & he wants to try rx (given Atrovent nasal), and Nocturia x4-5 which has been evaluated by DrWrenn & Dx w/ OB and tried on Vesicare w/o benefit; we discussed options- try Mybetriq vs f/u w/ Urology & he will decide... We reviewed the following medical problems during today's office visit>>     AR/ Sinusitis> notes drainage/ congestion & we decided on trial of Atrovent nasal spray...    Hx Bronchitis, ex-smoker> he denies cough, sputum, hemoptysis, SOB, etc; not on meds, no recent exac...    HBP> on MetopER50-1/2, Amlod10, Hyzaar100/25, K20-1/2;  BP= 128/62 & he denies CP, palpit, dizzy, SOB, edema; hx ACE cough in past; records indicate that DrWeintraub added LOSARTAN50 to his meds (for  a total of Losartan150+Hct25)...    CAD> on ASA81; Hx PCIs in 1994-5; followed by DrRW & seen 11/13 & Q60mo- doing satis & exercises regularly w/o angina (Note: he had atypCP & ER eval 9/13- r/o for MI & disch from ER for outpt f/u...    Carotid Art Dis> on ASA81; s/p right CAE w/ DPA 6/13 by DrEarly, no hx stroke & no cerebral ischemic symptoms; followed by DrEarly Q69mo.    Hyperlipid> on Simva40 & Zetia10 ("DrRW wants me on both"); last FLP by DrRW was?, not fasting today for blood work...    DM> on diet alone; labs today revealed BS=120 A1c=7.1; needs better diet 7 wt reduction or he'll need meds soon...    GI- GERD, IBS, Polyps> on Benefiber & AnusolHC; he is followed by DrGessner & was  seen 1/13 for f/u constip, hems, radiation proctitis; on benefiber & AnusolHC...    GU- BPH, prostate ca, ?OB> off Vesicare & followed by DrWrenn- seen 9/13 for f/u Hx prostate ca, hx stones, hx cysts, ED, nocturia & freq> they wanted to try Vesicare, but it has not been helpful he says; he was supposed to ret for f/u but cancelled; offered Mybetriq trial vs f/u w/ DrWrenn  & he will decide...    LBP> he takes multiple vit & mineral supplements;     Insomnia> on Ambien10 prn... We reviewed prob list, meds, xrays and labs> see below for updates >> We gave him the TDAP booster today, he's already had the 2013 flu shot... LABS 12/13:  FLP not done (not fasting);  Chems- ok but BS=120 A1c=7.1;  CBC- wnl;  TSH=1.60;  PSA=0.01          Problem List:   ALLERGIC RHINITIS (ICD-477.9) - spring allergies are a problem w/ all the pollen... he uses OTC antihistamines + nasal saline, FLONASE, & rec to incr Mucinex to 2Bid w/ fluids...  Hx of SINUSITIS (ICD-473.9) - prev hx of maxillary sinusitis treated w/ antibiotics, nasal saline, etc... he has finally quit smoking... MRI Brain 5/06 showed complete filling of the right max sinus w/ fluid... he is followed by [facility] for [facility]...  ? of SLEEP APNEA (ICD-780.57) - DrWeintraub's notes indicate remote hx of sleep apnea but we don't have any records of prev sleep study (done at [facility])... notes some snoring complaints but no daytime hypersomnolence etc... he refused to wear CPAP although DrRW wanted him on this Rx.  Hx of BRONCHITIS (ICD-490) - occas bronchial infections treated w/ antibiotics... baseline CXR w/ calcif in Ao & prom fat pad at LV apex, clear lungs... can't find prev PFT's... ~  CXR 4/10 showed calcif in Ao, clear lungs, NAD.Marland Kitchen. ~  CXR 9/11 showed prom mediastinal fat, clear lungs, NAD.Marland Kitchen. ~  CXR 12/12 showed COPD, borderline heart size, clear lungs/ NAD.Marland Kitchen. ~  CXR 9/13 showed normal heart size, clear lungs, NAD...  CIGARETTE SMOKER (ICD-305.1)  - he tells me that he quit smoking 07/25/08 (he's a leap year baby)...  HYPERTENSION (ICD-401.9) - on TOPROL XL 25mg /d, NORVASC 10mg /d, HYZAAR 100-12.5 + extra 50mg  Losartan per DrRW & KCl 94mEq/d... BP= 128/62 and similar at home... denies HA, fatigue, visual changes, CP, palipit, dizziness, syncope, dyspnea, edema, etc...   CORONARY ARTERY DISEASE (ICD-414.00) - on ASA 81mg /d... followed by DrWeintraub w/ known CAD & PCI to RCA & CIRC in 1994 & 1995... ~  2DEcho 12/08 showed norm LV & mild AoV sclerosis; & cardiolite was neg for ischemia... ~  2DEcho 6/10 showed  mild conc LVH, norm LVF w/ EF= 55%, norm wall motion. ~  f/u DrRW 12/10 & HCTZ incr to 25mg /d, and ?Cozaar incr to 150mg /d (which he never did)... ~  Myoview 1/11 showed scar present, no ischemia, +ectopy, no change from prev... ~  f/u DrWeintraub 5/12- stable, no changes made. ~  EKG 9/13 (in ER) showed NSR, rate82, PVCs w/ bigeminy, otherw neg...  CEREBROVASCULAR DISEASE (ICD-437.9) - prev MRI Brain 5/06 showed atrophy and small vessel disease + mild intracranial atherosclerotic changes on MRA... on ASA 81mg /d... ~  12/12:  He is due for f/u CDopplers & we will sched at [facility]=> done 1/13 & abnormal (see report); DrRW plans short term f/u... ~  6/13:  CD[facility]lers at [facility] showed 60-79% right ICA stenosis, right vertebral is antegrade; he underwent Right CAE 6/13 by DrEarly...  HYPERLIPIDEMIA (ICD-272.4) - on ZOCOR 40mg /d + ZETIA 10mg /d w/ labs checked by DrWeintraub according to the pt.... ~  FLP 4/10 here on Simva80 showed TChol 163, TG 120, HDL 41, LDL 99 ~  FLP 9/11 here on Simva40+Zetia10 showed TChol 131, TG 166, HDL 32, LDL 66 ~  FLP 12/12 on Simva40+Zetia10 showed TChol 116, TG 97, HDL 40, LDL 56 ~  ? F[facility]P [facility] this past yr- we don't have records... ~  12/13: he is not fasting for FLP today...  DIABETES MELLITUS, BORDERLINE (ICD-790.29) - he is overweight- 205# (stable x 33yrs) w/ 5\' 10"  frame = BMI 30... BS values have been  in the 125 - 130 range... we discussed low carb/ no sweets diet... ~  labs 4/10 (wt=205#) showed BS= 112, HgA1c= 6.2 ~  labs 9/11 (wt=202#) showed BS= 121, A1c= 6.7.Marland KitchenMarland Kitchen needs better diet, get weight down. ~  Labs 12/12 (wt=205#) on diet alone showed BS= 115, A1c= 6.5  ~  12/13: on diet alone; labs today revealed BS=120 A1c=7.1; needs better diet & wt reduction or he'll need meds soon...  GERD (ICD-530.81) - he denies GI symptoms...  IRRITABLE BOWEL SYNDROME (ICD-564.1) COLONIC POLYPS (ICD-211.3) - he had a follow up colonoscopy by DrGessner 5/10 that showed divertics, ?radiation proctitis, hems, & no recurrent polyps.  BENIGN PROSTATIC HYPERTROPHY, WITH OBSTRUCTION (ICD-600.01) - s/p TURP 6/05 & path benign. ADENOCARCINOMA, PROSTATE (ICD-185) - followed by ZOXWRUEA w/ prostate Ca diagnosed in 11/09 w/ PSA= 5.58... s/p XRT by DrManning completed 4/09, and then radium seed implants... he continues to follow up w/ DrDavis Q39mo but we don't have any of his notes. ~  9/11:  he tells me he has an appt w/ DrWrenn 10/11 (we don't have Urology notes to review)... ~  12/13: followed by DrWrenn- seen 9/13 for f/u Hx prostate ca, hx stones, hx cysts, ED, nocturia & freq> they wanted to try Vesicare, but it has not been helpful he says; he was supposed to ret for f/u but cancelled; offered Mybetriq trial vs f/u w/ DrWrenn  & he will decide...  LOW BACK PAIN SYNDROME (ICD-724.2) - hx lumbar laminectomy in the past...  HEALTH MAINTENANCE:  He receives the seasonal FLU vaccine each fall;  Given PNEUMOVAX 12/12...   Past Surgical History  Procedure Date  . Colonoscopy 10/21/2008    diverticulosis, radiation proctitis, internal hemorrhoids  . Appendectomy   . Lumbar laminectomy   . Transurethral resection of prostate 10/2003  . Cardiac catheterization   . Coronary stents   . Lithrotripsy      x 3  . Esophageal dilitation   . Cataract with lens  Bilateral  . Endarterectomy 11/12/2011    Procedure:  ENDARTERECTOMY CAROTID;  Surgeon: Larina Earthly, MD;  Location: [facility] OR;  Service: Vascular;  Laterality: Right;    Outpatient Encounter Prescriptions as of 05/06/2012  Medication Sig Dispense Refill  . amLODipine (NORVASC) 10 MG tablet Take 10 mg by mouth daily.        Marland Kitchen aspirin EC 81 MG tablet Take 81 mg by mouth daily.      . cholecalciferol (VITAMIN D) 1000 UNITS tablet Take 1,000 Units by mouth daily.        Marland Kitchen ezetimibe (ZETIA) 10 MG tablet Take 10 mg by mouth daily.        Marland Kitchen losartan (COZAAR) 50 MG tablet Take 50 mg by mouth daily.      Marland Kitchen losartan-hydrochlorothiazide (HYZAAR) 100-25 MG per tablet Take 1 tablet by mouth daily.       . metoprolol succinate (TOPROL-XL) 50 MG 24 hr tablet Take 25 mg by mouth daily. Take with or immediately following a meal.      . potassium chloride SA (K-DUR,KLOR-CON) 20 MEQ tablet Take 10 mEq by mouth daily.       . simvastatin (ZOCOR) 40 MG tablet Take 40 mg by mouth at bedtime.      . vitamin C (ASCORBIC ACID) 500 MG tablet Take 500 mg by mouth daily.       . Wheat Dextrin (BENEFIBER) POWD Take 1 packet by mouth every morning.      . zolpidem (AMBIEN) 10 MG tablet Take 10 mg by mouth at bedtime as needed. For sleep.      . [DISCONTINUED] solifenacin (VESICARE) 10 MG tablet Take 10 mg by mouth daily.        No Known Allergies   Current Medications, Allergies, Past Medical History, Past Surgical History, Family History, and Social History were reviewed in [employer] record.    Review of Systems         See HPI - all other systems neg except as noted...  The patient complains of decreased hearing.  The patient denies anorexia, fever, weight loss, weight gain, vision loss, hoarseness, chest pain, syncope, dyspnea on exertion, peripheral edema, prolonged cough, headaches, hemoptysis, abdominal pain, melena, hematochezia, severe indigestion/heartburn, hematuria, incontinence, muscle weakness, suspicious skin lesions, transient  blindness, difficulty walking, depression, unusual weight change, abnormal bleeding, enlarged lymph nodes, and angioedema.     Objective:   Physical Exam     WD, WN, 78 y/o WM in NAD... GENERAL:  Alert & oriented; pleasant & cooperative... HEENT:  [place]/AT, EOM-wnl, PERRLA, EACs-clear, TMs-wnl, NOSE-clear, THROAT-clear & wnl. NECK:  Supple w/ fairROM; no JVD; normal carotid impulses w/o bruits; no thyromegaly or nodules palpated; no lymphadenopathy. CHEST:  Clear to P & A; without wheezes/ rales/ or rhonchi. HEART:  Regular Rhythm; without murmurs/ rubs/ or gallops. ABDOMEN:  Soft & nontender; normal bowel sounds; no organomegaly or masses detected. RECTAL:  dererred to [facility] EXT: without deformities or arthritic changes; no varicose veins/ venous insuffic/ or edema. NEURO:  CN's intact; motor testing normal; sensory testing normal; gait normal & balance OK. DERM:  No lesions noted; no rash etc...  RADIOLOGY DATA:  Reviewed in the EPIC EMR & discussed w/ the patient...  LABORATORY DATA:  Reviewed in the EPIC EMR & discussed w/ the patient...   Assessment & Plan:    AR/ Hx Sinusitis>  Followed by [facility] for ENT & his note is reviewed, try Atrovent nasal for drainage...  Hx  Bronchitis/ Ex-smoker>  He quit in 2010 & has remained quit, no recent URI or bronchitic exac...  HBP>  Controlled on meds as listed, adjusted by DrRW; continue same...  CAD>  Followed by DrRW & stable; encouraged to continue exercise etc...  Cerebrovasc Dis>  He is s/p right CAE & followed by DrEarly...  Hyperlipid>  Prev FLPs looks good on Simva + Zetia along w/ diet etc...  DM>  On diet alone w/ fair numbers, needs to work on weight reduction...  GI>  Followed by DrGessner> GERD, IBS, Polyps, & Hems- on Anusol & has had injection from DrIngram...  GU>  Prostate cancer>  Followed by DrWrenn now & his note is reviewed...   Patient's Medications  New Prescriptions   IPRATROPIUM (ATROVENT) 0.03 % NASAL  SPRAY    Place 2 sprays into the nose 3 (three) times daily as needed for rhinitis.  Previous Medications   AMLODIPINE (NORVASC) 10 MG TABLET    Take 10 mg by mouth daily.     ASPIRIN EC 81 MG TABLET    Take 81 mg by mouth daily.   CHOLECALCIFEROL (VITAMIN D) 1000 UNITS TABLET    Take 1,000 Units by mouth daily.     EZETIMIBE (ZETIA) 10 MG TABLET    Take 10 mg by mouth daily.     LOSARTAN (COZAAR) 50 MG TABLET    Take 50 mg by mouth daily.   LOSARTAN-HYDROCHLOROTHIAZIDE (HYZAAR) 100-25 MG PER TABLET    Take 1 tablet by mouth daily.    METOPROLOL SUCCINATE (TOPROL-XL) 50 MG 24 HR TABLET    Take 25 mg by mouth daily. Take with or immediately following a meal.   POTASSIUM CHLORIDE SA (K-DUR,KLOR-CON) 20 MEQ TABLET    Take 10 mEq by mouth daily.    SIMVASTATIN (ZOCOR) 40 MG TABLET    Take 40 mg by mouth at bedtime.   VITAMIN C (ASCORBIC ACID) 500 MG TABLET    Take 500 mg by mouth daily.    WHEAT DEXTRIN (BENEFIBER) POWD    Take 1 packet by mouth every morning.   ZOLPIDEM (AMBIEN) 10 MG TABLET    Take 10 mg by mouth at bedtime as needed. For sleep.  Modified Medications   No medications on file  Discontinued Medications   SOLIFENACIN (VESICARE) 10 MG TABLET    Take 10 mg by mouth daily.

## 2012-06-10 ENCOUNTER — Ambulatory Visit: Payer: Medicare Other | Admitting: Vascular Surgery

## 2012-06-10 ENCOUNTER — Other Ambulatory Visit: Payer: Medicare Other

## 2012-06-16 ENCOUNTER — Encounter: Payer: Self-pay | Admitting: Vascular Surgery

## 2012-06-17 ENCOUNTER — Encounter: Payer: Self-pay | Admitting: Vascular Surgery

## 2012-06-17 ENCOUNTER — Ambulatory Visit (INDEPENDENT_AMBULATORY_CARE_PROVIDER_SITE_OTHER): Payer: Medicare Other | Admitting: Vascular Surgery

## 2012-06-17 ENCOUNTER — Other Ambulatory Visit: Payer: Medicare Other

## 2012-06-17 VITALS — BP 140/62 | HR 53 | Resp 20 | Ht 69.5 in | Wt 198.0 lb

## 2012-06-17 DIAGNOSIS — I6529 Occlusion and stenosis of unspecified carotid artery: Secondary | ICD-10-CM

## 2012-06-17 NOTE — Progress Notes (Signed)
The patient presents today for followup of his right carotid endarterectomy in June of 2013. He had a severe asymptomatic stenosis. He also had a known moderate left carotid stenosis. Fortunately he remains completely asymptomatic. He denies any amaurosis fugax, transient ischemic attack or stroke. He did have the usual amount of peri-incisional numbness in his right neck and the majority of this is resolved but he still does have some numbness. I explained this all likelihood will persist that he could expect some slight improvement after one year from the time of surgery. He reports this is not causing him any disability.  Past Medical History  Diagnosis Date  . Allergic rhinitis   . Smoker   . HTN (hypertension)   . CAD (coronary artery disease)   . CVA (cerebral infarction)   . Hyperlipidemia   . IBS (irritable bowel syndrome)   . Diverticulitis   . Hemorrhoids   . BPH (benign prostatic hyperplasia)   . Prostate cancer 2008    seed implantation  . Low back pain   . Adenomatous colon polyp   . Sleep apnea     last study was aborted.  Has never worn a CPAP.    . Diabetes mellitus     "Borderline"  . Chronic kidney disease     Sones. Prostate cancer  . GERD (gastroesophageal reflux disease)     history    History  Substance Use Topics  . Smoking status: Former Smoker -- 1.5 packs/day for 60 years    Types: Cigarettes    Quit date: 07/25/2008  . Smokeless tobacco: Never Used  . Alcohol Use: 2.0 oz/week    4 drink(s) per week     Comment: Occ    Family History  Problem Relation Age of Onset  . Heart disease Father     No Known Allergies  Current outpatient prescriptions:amLODipine (NORVASC) 10 MG tablet, Take 10 mg by mouth daily.  , Disp: , Rfl: ;  aspirin EC 81 MG tablet, Take 81 mg by mouth daily., Disp: , Rfl: ;  cholecalciferol (VITAMIN D) 1000 UNITS tablet, Take 1,000 Units by mouth daily.  , Disp: , Rfl: ;  ezetimibe (ZETIA) 10 MG tablet, Take 10 mg by mouth daily.   , Disp: , Rfl:  ipratropium (ATROVENT) 0.03 % nasal spray, Place 2 sprays into the nose 3 (three) times daily as needed for rhinitis., Disp: 30 mL, Rfl: 6;  losartan (COZAAR) 50 MG tablet, Take 50 mg by mouth daily., Disp: , Rfl: ;  losartan-hydrochlorothiazide (HYZAAR) 100-25 MG per tablet, Take 1 tablet by mouth daily. , Disp: , Rfl:  metoprolol succinate (TOPROL-XL) 50 MG 24 hr tablet, Take 25 mg by mouth daily. Take with or immediately following a meal., Disp: , Rfl: ;  potassium chloride SA (K-DUR,KLOR-CON) 20 MEQ tablet, Take 10 mEq by mouth daily. , Disp: , Rfl: ;  simvastatin (ZOCOR) 40 MG tablet, Take 40 mg by mouth at bedtime., Disp: , Rfl: ;  vitamin C (ASCORBIC ACID) 500 MG tablet, Take 500 mg by mouth daily. , Disp: , Rfl:  Wheat Dextrin (BENEFIBER) POWD, Take 1 packet by mouth every morning., Disp: , Rfl: ;  zolpidem (AMBIEN) 10 MG tablet, Take 10 mg by mouth at bedtime as needed. For sleep., Disp: , Rfl:   BP 140/62  Pulse 53  Resp 20  Ht 5' 9.5" (1.765 m)  Wt 198 lb (89.812 kg)  BMI 28.82 kg/m2  Body mass index is 28.82 kg/(m^2).  Physical exam: Well-developed well-nourished white male no acute distress Neurologically he is grossly intact Heart regular rate and rhythm, 2+ radial pulses bilaterally Carotid arteries without bruits bilaterally. Well-healed right neck incision. Normal carotid pulses.  I reviewed his carotid duplex from [facility] in 03/27/2012. This reveals widely patent right endarterectomy and moderate 50-69% stenosis in the left internal carotid artery  Impression and plan: Stable status post right carotid endarterectomy June 2013. The patient knows to notify us or his medical doctor for any neurologic deficits. He is being followed at [facility] with serial carotid duplex. With this contralateral stenosis I would recommend carotid duplex at 6 month intervals. We would recommend reevaluation bypass for regression of his  asymptomatic stenosis or symptomatic disease on the left

## 2012-07-08 ENCOUNTER — Telehealth: Payer: Self-pay | Admitting: Pulmonary Disease

## 2012-07-08 MED ORDER — DIPHENHYD-HYDROCORT-NYSTATIN MT SUSP: OROMUCOSAL | Status: DC

## 2012-07-08 MED ORDER — AZITHROMYCIN 250 MG PO TABS: ORAL_TABLET | ORAL | Status: DC

## 2012-07-08 NOTE — Telephone Encounter (Signed)
Per SN-give zpak #1, mucinex 600mg  take 2 po bid, MMW 4oz 1 tsp swish and swallow four times daily, OTC throat lozenges, tylenol prn. [name], CMA

## 2012-07-08 NOTE — Telephone Encounter (Signed)
I spoke with pt and he c/o dry cough, hoarseness, chest congestion, scratchy throat, slight HA x yesterday. No f/c/s/n/v. He has been taking mucinex and delsym cough syrup. Please advise SN thanks Last OV 05/06/12 No pending appt No Known Allergies

## 2012-07-08 NOTE — Telephone Encounter (Signed)
I spoke with pt and is aware of Sn recs. He voiced his understanding. RX has been sent to the pharmacy. Nothing further was needed

## 2012-07-17 ENCOUNTER — Encounter: Payer: Self-pay | Admitting: *Deleted

## 2012-07-17 ENCOUNTER — Encounter (HOSPITAL_COMMUNITY): Payer: Self-pay | Admitting: Emergency Medicine

## 2012-07-17 ENCOUNTER — Emergency Department (HOSPITAL_COMMUNITY)
Admission: EM | Admit: 2012-07-17 | Discharge: 2012-07-17 | Disposition: A | Discharge status: Home / Self Care | Payer: Medicare Other | Attending: Emergency Medicine | Admitting: Emergency Medicine

## 2012-07-17 DIAGNOSIS — Z8719 Personal history of other diseases of the digestive system: Secondary | ICD-10-CM | POA: Insufficient documentation

## 2012-07-17 DIAGNOSIS — E785 Hyperlipidemia, unspecified: Secondary | ICD-10-CM | POA: Insufficient documentation

## 2012-07-17 DIAGNOSIS — N189 Chronic kidney disease, unspecified: Secondary | ICD-10-CM | POA: Insufficient documentation

## 2012-07-17 DIAGNOSIS — Z860100 Personal history of colon polyps, unspecified: Secondary | ICD-10-CM | POA: Insufficient documentation

## 2012-07-17 DIAGNOSIS — R339 Retention of urine, unspecified: Secondary | ICD-10-CM | POA: Insufficient documentation

## 2012-07-17 DIAGNOSIS — Z87891 Personal history of nicotine dependence: Secondary | ICD-10-CM | POA: Insufficient documentation

## 2012-07-17 DIAGNOSIS — G4730 Sleep apnea, unspecified: Secondary | ICD-10-CM | POA: Insufficient documentation

## 2012-07-17 DIAGNOSIS — Z9861 Coronary angioplasty status: Secondary | ICD-10-CM | POA: Insufficient documentation

## 2012-07-17 DIAGNOSIS — Z87442 Personal history of urinary calculi: Secondary | ICD-10-CM | POA: Insufficient documentation

## 2012-07-17 DIAGNOSIS — Z79899 Other long term (current) drug therapy: Secondary | ICD-10-CM | POA: Insufficient documentation

## 2012-07-17 DIAGNOSIS — Z8739 Personal history of other diseases of the musculoskeletal system and connective tissue: Secondary | ICD-10-CM | POA: Insufficient documentation

## 2012-07-17 DIAGNOSIS — R109 Unspecified abdominal pain: Secondary | ICD-10-CM | POA: Insufficient documentation

## 2012-07-17 DIAGNOSIS — Z8673 Personal history of transient ischemic attack (TIA), and cerebral infarction without residual deficits: Secondary | ICD-10-CM | POA: Insufficient documentation

## 2012-07-17 DIAGNOSIS — I2510 Atherosclerotic heart disease of native coronary artery without angina pectoris: Secondary | ICD-10-CM | POA: Insufficient documentation

## 2012-07-17 DIAGNOSIS — Z8546 Personal history of malignant neoplasm of prostate: Secondary | ICD-10-CM | POA: Insufficient documentation

## 2012-07-17 DIAGNOSIS — I129 Hypertensive chronic kidney disease with stage 1 through stage 4 chronic kidney disease, or unspecified chronic kidney disease: Secondary | ICD-10-CM | POA: Insufficient documentation

## 2012-07-17 DIAGNOSIS — Z7982 Long term (current) use of aspirin: Secondary | ICD-10-CM | POA: Insufficient documentation

## 2012-07-17 DIAGNOSIS — R319 Hematuria, unspecified: Secondary | ICD-10-CM | POA: Insufficient documentation

## 2012-07-17 LAB — URINALYSIS, MICROSCOPIC ONLY
Glucose, UA: NEGATIVE mg/dL
Ketones, ur: NEGATIVE mg/dL
Protein, ur: >300 mg/dL — AB
Urobilinogen, UA: 0.2 mg/dL (ref 0.0–1.0)

## 2012-07-17 LAB — POCT I-STAT, CHEM 8
BUN: 40 mg/dL — ABNORMAL HIGH (ref 6–23)
Chloride: 108 mEq/L (ref 96–112)
Creatinine, Ser: 1.6 mg/dL — ABNORMAL HIGH (ref 0.50–1.35)
Sodium: 138 mEq/L (ref 135–145)

## 2012-07-17 LAB — CBC
HCT: 40.9 % (ref 39.0–52.0)
Hemoglobin: 13.3 g/dL (ref 13.0–17.0)
MCH: 28.2 pg (ref 26.0–34.0)
MCHC: 32.5 g/dL (ref 30.0–36.0)
MCV: 86.7 fL (ref 78.0–100.0)
RBC: 4.72 MIL/uL (ref 4.22–5.81)

## 2012-07-17 NOTE — ED Notes (Signed)
Pt states last night around 8pm he had a burning sensation like he needed to void and when he went it was bloody with clots noted  Pt states ever since everytime he voids he has passed blood and clots  Pt states he has hx of kidney stones in the past  Pt states he is having lower back discomfort but denies flank pain

## 2012-07-17 NOTE — ED Provider Notes (Signed)
History     CSN: [id number]  Arrival date & time 07/17/12  0444   First MD Initiated Contact with Patient 07/17/12 0500      Chief Complaint  Patient presents with  . Hematuria    (Consider location/radiation/quality/duration/timing/severity/associated sxs/prior treatment) HPI History provided by patient. At home tonight developed hematuria with urinary urgency and blood clots and eventually some urinary retention. Has previous history of TURP and is followed by urology Dr. Annabell Howells.  Symptoms progressively worsening and now moderate in severity without history of same. No fevers or chills. No back pain. No abdominal pain otherwise.   No known aggravating or alleviating factors. Has history of kidney stones but denies any flank pain Past Medical History  Diagnosis Date  . Allergic rhinitis   . Smoker   . HTN (hypertension)   . CAD (coronary artery disease)   . CVA (cerebral infarction)   . Hyperlipidemia   . IBS (irritable bowel syndrome)   . Diverticulitis   . Hemorrhoids   . BPH (benign prostatic hyperplasia)   . Prostate cancer 2008    seed implantation  . Low back pain   . Adenomatous colon polyp   . Sleep apnea     last study was aborted.  Has never worn a CPAP.    . Diabetes mellitus     "Borderline"  . Chronic kidney disease     Sones. Prostate cancer  . GERD (gastroesophageal reflux disease)     history    Past Surgical History  Procedure Laterality Date  . Colonoscopy  10/21/2008    diverticulosis, radiation proctitis, internal hemorrhoids  . Appendectomy    . Lumbar laminectomy    . Transurethral resection of prostate  10/2003  . Cardiac catheterization    . Coronary stents    . Lithrotripsy       x 3  . Esophageal dilitation    . Cataract with lens      Bilateral  . Endarterectomy  11/12/2011    Procedure: ENDARTERECTOMY CAROTID;  Surgeon: Larina Earthly, MD;  [facility]: [facility];  Service: Vascular;  Laterality: Right;    Family History  Problem Relation  Age of Onset  . Heart disease Father     History  Substance Use Topics  . Smoking status: Former Smoker -- 1.50 packs/day for 60 years    Types: Cigarettes    Quit date: 07/25/2008  . Smokeless tobacco: Never Used  . Alcohol Use: 2.0 oz/week    4 drink(s) per week     Comment: Occ      Review of Systems  Constitutional: Negative for fever and chills.  HENT: Negative for neck pain and neck stiffness.   Eyes: Negative for pain.  Respiratory: Negative for shortness of breath.   Cardiovascular: Negative for chest pain.  Gastrointestinal: Negative for abdominal pain.  Genitourinary: Positive for hematuria. Negative for dysuria.  Musculoskeletal: Negative for back pain.  Skin: Negative for rash.  Neurological: Negative for headaches.  All other systems reviewed and are negative.    Allergies  Review of patient's allergies indicates no known allergies.  Home Medications   Current Outpatient Rx  Name  Route  Sig  Dispense  Refill  . amLODipine (NORVASC) 10 MG tablet   Oral   Take 10 mg by mouth daily.           Marland Kitchen aspirin EC 81 MG tablet   Oral   Take 81 mg by mouth every evening.          Marland Kitchen  cholecalciferol (VITAMIN D) 1000 UNITS tablet   Oral   Take 1,000 Units by mouth daily.           . Diphenhyd-Hydrocort-Nystatin SUSP      1 tsp swish and swallow 4 times a day   4 oz   0   . ezetimibe (ZETIA) 10 MG tablet   Oral   Take 10 mg by mouth every evening.          Marland Kitchen ipratropium (ATROVENT) 0.03 % nasal spray   Nasal   Place 2 sprays into the nose 3 (three) times daily as needed for rhinitis.   30 mL   6   . losartan (COZAAR) 50 MG tablet   Oral   Take 50 mg by mouth daily.         Marland Kitchen losartan-hydrochlorothiazide (HYZAAR) 100-25 MG per tablet   Oral   Take 1 tablet by mouth daily.          . metoprolol succinate (TOPROL-XL) 50 MG 24 hr tablet   Oral   Take 25 mg by mouth every evening. Take with or immediately following a meal.         .  potassium chloride SA (K-DUR,KLOR-CON) 20 MEQ tablet   Oral   Take 10 mEq by mouth daily.          . simvastatin (ZOCOR) 40 MG tablet   Oral   Take 40 mg by mouth at bedtime.         . vitamin C (ASCORBIC ACID) 500 MG tablet   Oral   Take 500 mg by mouth daily.          . Wheat Dextrin (BENEFIBER) POWD   Oral   Take 1 packet by mouth every morning.         . zolpidem (AMBIEN) 10 MG tablet   Oral   Take 10 mg by mouth at bedtime as needed. For sleep.           BP 133/39  Pulse 65  Temp(Src) 97.9 F (36.6 C) (Oral)  Resp 18  SpO2 97%  Physical Exam  Constitutional: He is oriented to person, place, and time. He appears well-developed and well-nourished.  HENT:  Head: Normocephalic and atraumatic.  Eyes: EOM are normal. Pupils are equal, round, and reactive to light.  Neck: Neck supple.  Cardiovascular: Normal rate, regular rhythm and intact distal pulses.   Pulmonary/Chest: Effort normal and breath sounds normal. No respiratory distress.  Abdominal: Soft. Bowel sounds are normal. There is no rebound and no guarding.  Mild suprapubic distention and tenderness. No tenderness otherwise  Musculoskeletal: Normal range of motion. He exhibits no edema.  Neurological: He is alert and oriented to person, place, and time.  Skin: Skin is warm and dry.    ED Course  Procedures (including critical care time)  Results for orders placed during the hospital encounter of 07/17/12  CBC      Result Value Range   WBC 7.1  4.0 - 10.5 K/uL   RBC 4.72  4.22 - 5.81 MIL/uL   Hemoglobin 13.3  13.0 - 17.0 g/dL   HCT 45.4  09.8 - 11.9 %   MCV 86.7  78.0 - 100.0 fL   MCH 28.2  26.0 - 34.0 pg   MCHC 32.5  30.0 - 36.0 g/dL   RDW 14.7  82.9 - 56.2 %   Platelets 250  150 - 400 K/uL  URINALYSIS, MICROSCOPIC ONLY      Result  Value Range   Color, Urine RED (*) YELLOW   APPearance TURBID (*) CLEAR   Specific Gravity, Urine 1.018  1.005 - 1.030   pH 6.0  5.0 - 8.0   Glucose, UA  NEGATIVE  NEGATIVE mg/dL   Hgb urine dipstick LARGE (*) NEGATIVE   Bilirubin Urine NEGATIVE  NEGATIVE   Ketones, ur NEGATIVE  NEGATIVE mg/dL   Protein, ur >161 (*) NEGATIVE mg/dL   Urobilinogen, UA 0.2  0.0 - 1.0 mg/dL   Nitrite NEGATIVE  NEGATIVE   Leukocytes, UA TRACE (*) NEGATIVE   WBC, UA 0-2  <3 WBC/hpf   RBC / HPF TOO NUMEROUS TO COUNT  <3 RBC/hpf   Bacteria, UA FIELD OBSCURED BY RBC'S (*) RARE   Urine-Other URINALYSIS PERFORMED ON SUPERNATANT    POCT I-STAT, CHEM 8      Result Value Range   Sodium 138  135 - 145 mEq/L   Potassium 4.9  3.5 - 5.1 mEq/L   Chloride 108  96 - 112 mEq/L   BUN 40 (*) 6 - 23 mg/dL   Creatinine, Ser 0.96 (*) 0.50 - 1.35 mg/dL   Glucose, Bld 045 (*) 70 - 99 mg/dL   Calcium, Ion 4.09  8.11 - 1.30 mmol/L   TCO2 25  0 - 100 mmol/L   Hemoglobin 12.9 (*) 13.0 - 17.0 g/dL   HCT 91.4 (*) 78.2 - 95.6 %    Foley placed and irrigated with cold saline. No clots. Good flow with some residual red tinged urine. Recheck at 620 feeling significantly better.  6:30 AM discussed with urology on-call Dr. Laverle Patter, he recommends given good flow at this time with Foley in place, is okay for discharge home to be seen by Dr. Annabell Howells today or tomorrow  MDM  Hematuria with urinary retention, resolved with Foley catheter and irrigation.   UA and labs reviewed as above.  Urology consult and plan close outpatient followup. Leg bag and Foley instructions provided.  Vital signs and nursing notes reviewed and considered.    Sunnie Nielsen, MD 07/17/12 [phone]

## 2012-07-18 ENCOUNTER — Telehealth: Payer: Self-pay | Admitting: Pulmonary Disease

## 2012-07-18 MED ORDER — METHYLPREDNISOLONE 4 MG PO KIT: PACK | ORAL | Status: DC

## 2012-07-18 NOTE — Telephone Encounter (Signed)
Pt called on 07-08-12 and was c/o dry cough and chest congestion and was given a zpak, MMW, mucinex. Pt states he has finished abx but he feels worse. He staets that he now has a productive cough with brown phlegm, wheezing, nasal congestion with green mucus, chest congestion. Pt states his wife has the same symptoms and she went to the ER and was given prednisone and abx. Pt feels that he needs the same. Please advise. No Known Allergies

## 2012-07-18 NOTE — Telephone Encounter (Signed)
Per SN----  Call in medrol dosepak  #1  Take as directed with no refills  If worse over the weekend go to the ER for eval.  thanks

## 2012-07-18 NOTE — Telephone Encounter (Signed)
Spoke with pt and notified of recs per SN He verbalized understanding and denied any questions Nothing further needed Rx was sent to pharm

## 2012-09-25 ENCOUNTER — Telehealth: Payer: Self-pay | Admitting: Pulmonary Disease

## 2012-09-25 NOTE — Telephone Encounter (Signed)
I spoke with spouse. She stated pt had PNA vaccine 05/07/11. They received a bill from [employer] that pt also received a PNA vaccine 05/06/12 and TDAP 05/06/12. Pt does not remember getting both of these. Looking in pt chart i see he was charged for both of these vaccines. Although only one PNA vaccine is documented in pt chart from 05/07/11. Will forward to [facility] to see what we can do about this. Please advise thanks

## 2012-09-26 NOTE — Telephone Encounter (Signed)
Looks as though patient got a Flu and TDAP vaccine on 05-06-12; will speak with Donaciano Eva about changing this charge for patients insurance.

## 2012-09-26 NOTE — Telephone Encounter (Signed)
After speaking with Sharone-this matter has been dealt with and corrected. The patient's spouse is aware. Nothing more needed at this time;will sign off on message.

## 2012-10-01 ENCOUNTER — Other Ambulatory Visit (HOSPITAL_COMMUNITY): Payer: Self-pay | Admitting: Cardiovascular Disease

## 2012-10-01 DIAGNOSIS — I714 Abdominal aortic aneurysm, without rupture, unspecified: Secondary | ICD-10-CM

## 2012-10-01 DIAGNOSIS — R011 Cardiac murmur, unspecified: Secondary | ICD-10-CM

## 2012-10-03 ENCOUNTER — Ambulatory Visit (HOSPITAL_COMMUNITY)
Admission: RE | Admit: 2012-10-03 | Discharge: 2012-10-03 | Disposition: A | Discharge status: Home / Self Care | Payer: Medicare Other | Source: Ambulatory Visit | Attending: Cardiovascular Disease | Admitting: Cardiovascular Disease

## 2012-10-03 DIAGNOSIS — I2510 Atherosclerotic heart disease of native coronary artery without angina pectoris: Secondary | ICD-10-CM | POA: Insufficient documentation

## 2012-10-03 DIAGNOSIS — R0989 Other specified symptoms and signs involving the circulatory and respiratory systems: Secondary | ICD-10-CM | POA: Insufficient documentation

## 2012-10-03 DIAGNOSIS — E785 Hyperlipidemia, unspecified: Secondary | ICD-10-CM | POA: Insufficient documentation

## 2012-10-03 DIAGNOSIS — R011 Cardiac murmur, unspecified: Secondary | ICD-10-CM | POA: Insufficient documentation

## 2012-10-03 DIAGNOSIS — R0609 Other forms of dyspnea: Secondary | ICD-10-CM | POA: Insufficient documentation

## 2012-10-03 NOTE — Progress Notes (Signed)
[facility]   2D echo completed 10/03/2012.   Cindy Natosha Bou, RDCS  

## 2012-10-07 ENCOUNTER — Ambulatory Visit (HOSPITAL_COMMUNITY)
Admission: RE | Admit: 2012-10-07 | Discharge: 2012-10-07 | Disposition: A | Discharge status: Home / Self Care | Payer: Medicare Other | Source: Ambulatory Visit | Attending: Cardiovascular Disease | Admitting: Cardiovascular Disease

## 2012-10-07 DIAGNOSIS — R011 Cardiac murmur, unspecified: Secondary | ICD-10-CM | POA: Insufficient documentation

## 2012-10-07 DIAGNOSIS — I714 Abdominal aortic aneurysm, without rupture, unspecified: Secondary | ICD-10-CM | POA: Insufficient documentation

## 2012-10-07 NOTE — Progress Notes (Signed)
Abdominal Aortic Ultrasound Performed. Dillon Young

## 2012-10-07 NOTE — Progress Notes (Signed)
Aorta Duplex Completed. Drystan Reader D  

## 2012-10-29 ENCOUNTER — Other Ambulatory Visit: Payer: Self-pay | Admitting: Pharmacist Clinician (PhC)/ Clinical Pharmacy Specialist

## 2012-10-30 ENCOUNTER — Other Ambulatory Visit: Payer: Self-pay

## 2012-10-30 MED ORDER — LOSARTAN POTASSIUM 50 MG PO TABS: 50.0000 mg | ORAL_TABLET | Freq: Every day | ORAL | Status: DC

## 2012-10-30 MED ORDER — SIMVASTATIN 40 MG PO TABS: 40.0000 mg | ORAL_TABLET | Freq: Every day | ORAL | Status: DC

## 2012-10-30 MED ORDER — POTASSIUM CHLORIDE CRYS ER 20 MEQ PO TBCR: 10.0000 meq | EXTENDED_RELEASE_TABLET | Freq: Every day | ORAL | Status: DC

## 2012-10-30 NOTE — Telephone Encounter (Signed)
Rx was sent to pharmacy electronically via [employer].  

## 2012-11-12 ENCOUNTER — Telehealth: Payer: Self-pay | Admitting: Cardiovascular Disease

## 2012-11-12 NOTE — Telephone Encounter (Signed)
Need new prescription for Losartan 100-25mg -Please call to [facility]-[phone]!

## 2012-11-12 NOTE — Telephone Encounter (Signed)
Refills sent on 6.5.14 for K+, losartan and simvastatin.  Returned call and spoke w/ wife, [name].  Informed refills sent and to contact pharmacy.  Verbalized understanding.

## 2012-11-18 ENCOUNTER — Telehealth: Payer: Self-pay | Admitting: Cardiovascular Disease

## 2012-11-18 ENCOUNTER — Telehealth: Payer: Self-pay | Admitting: Pulmonary Disease

## 2012-11-18 MED ORDER — LOSARTAN POTASSIUM-HCTZ 100-25 MG PO TABS: 1.0000 | ORAL_TABLET | Freq: Every day | ORAL | Status: DC

## 2012-11-18 NOTE — Telephone Encounter (Signed)
Returned call.  Pt stated he already had Rx taken care of by Dr. Kriste Basque.  No Rx sent.    Of note, Refill was sent for losartan 50mg  instead of the losartan-hctz-100-25mg  on refill request.  Pt was informed it was an oversight and was upset that Rx sent inadvertently.  Stated he will get it though Dr. Kriste Basque from now on.

## 2012-11-18 NOTE — Telephone Encounter (Signed)
Mr.Bertone is calling because the wrong prescription was sent to [facility] and he has been out of his medication for about a week .  Losartan/HCTZ 100-25mg   Wants to speak to some one and would like a call back on today ...... please call..   Thanks

## 2012-11-18 NOTE — Telephone Encounter (Signed)
Called and spoke with pt and he is requesting the losartan 100/25 to be sent in to [facility] for refill. This has been done and the pt also requested that a 30 day supply of the same med [facility] [address] since he is totally out of this med.  This has been done and pt is aware and nothing further is needed.

## 2012-12-18 ENCOUNTER — Other Ambulatory Visit: Payer: Self-pay | Admitting: *Deleted

## 2012-12-18 MED ORDER — EZETIMIBE 10 MG PO TABS: 10.0000 mg | ORAL_TABLET | Freq: Every evening | ORAL | Status: DC

## 2012-12-22 ENCOUNTER — Telehealth: Payer: Self-pay | Admitting: Pulmonary Disease

## 2012-12-22 MED ORDER — FLUTICASONE PROPIONATE 50 MCG/ACT NA SUSP: 2.0000 | Freq: Every day | NASAL | Status: DC

## 2012-12-22 NOTE — Telephone Encounter (Signed)
I spoke with the pt spouse and she states the pt has been using her Flonase and prefers this over the Atrovent nasal spray that he has used in the past. He is asking for an rx for flonase instead of Atrovent nasal spray. Please advise.[name], CMA

## 2012-12-22 NOTE — Telephone Encounter (Signed)
Per SN--  Ok to send in the refill for the flonase.  This has been completed and the pts wife is aware.

## 2013-03-06 ENCOUNTER — Telehealth: Payer: Self-pay | Admitting: Pulmonary Disease

## 2013-03-06 DIAGNOSIS — H9201 Otalgia, right ear: Secondary | ICD-10-CM

## 2013-03-06 NOTE — Telephone Encounter (Signed)
Called and spoke with pt and he stated that he has been having problems with his right ear.  Pt stated that about 5 years ago he had a tube placed but this has been removed since then.  He stated that the ear itches a lot and has some drainage in his neck that is irritating.  He also wanted to know if he needs a hearing test.  Pt has had in the past problems with wax build up.  He does use ear drops from time to time but these seem to not be helping.  Pt has seen Dr. Jenne Pane in the past but would prefer to see someone else.  SN please advise. Thanks  No Known Allergies   Last ov-05/06/2012 No pending appts with SN.

## 2013-03-06 NOTE — Telephone Encounter (Signed)
Per SN---  Ok to set up appt with ENT for eval of ear pain.  Called and spoke with pt and he is aware of order placed and that we will call him back with appt date and time.

## 2013-04-20 ENCOUNTER — Telehealth: Payer: Self-pay | Admitting: Pulmonary Disease

## 2013-04-20 MED ORDER — AMOXICILLIN-POT CLAVULANATE 875-125 MG PO TABS: 1.0000 | ORAL_TABLET | Freq: Two times a day (BID) | ORAL | Status: DC

## 2013-04-20 MED ORDER — PROMETHAZINE HCL 25 MG PO TABS: 25.0000 mg | ORAL_TABLET | ORAL | Status: DC | PRN

## 2013-04-20 NOTE — Telephone Encounter (Signed)
Per SN---  augmentin 875 mg  #14  1 po bid Phenergan 25 mg  #30  1 po every 4 hours as needed for nausea Hycodan #6oz  1 tsp every 6 hours as needed for cough Cont taking the mucinex

## 2013-04-20 NOTE — Telephone Encounter (Signed)
Spoke with the pt spouse and advised of recs. They do not want the hycodan rx, insurance does not cover this and he is using otc. Other rx sent. [name], CMA

## 2013-04-20 NOTE — Telephone Encounter (Signed)
Spoke with pt. C/o productive cough w/ brown phlem, sore throat, chest feels raw when coughs, had some vomiting with teh coughing last night and some chills. This has been going on x 2 days, Taking mucinex and OTC cough syrup delsym. Pt requesting further recs. Please advise SN thanks [facility] [address] No Known Allergies

## 2013-05-11 ENCOUNTER — Telehealth: Payer: Self-pay | Admitting: Cardiovascular Disease

## 2013-05-11 DIAGNOSIS — Z79899 Other long term (current) drug therapy: Secondary | ICD-10-CM

## 2013-05-11 DIAGNOSIS — E782 Mixed hyperlipidemia: Secondary | ICD-10-CM

## 2013-05-11 MED ORDER — METOPROLOL SUCCINATE ER 25 MG PO TB24: 25.0000 mg | ORAL_TABLET | Freq: Every evening | ORAL | Status: DC

## 2013-05-11 MED ORDER — EZETIMIBE 10 MG PO TABS: 10.0000 mg | ORAL_TABLET | Freq: Every evening | ORAL | Status: DC

## 2013-05-11 NOTE — Telephone Encounter (Signed)
Wants to talk to nurse regarding metoprolol 50 mg and zetia.  Please call

## 2013-05-11 NOTE — Telephone Encounter (Signed)
Returned call and pt verified x 2.  Pt stated he needs a refill on metoprolol.  Pt uses [facility].  Pt also stated his granddaughter just graduated from pharmacy school and looked at his med list.  Stated she suggested he ask about being switched from simvastatin to atorvastatin b/c atorvastatin is stronger and Zetia is too expensive.  Pt would like to d/c Zetia b/c of cost.  Pt informed last labs were WNL at current dose.  Informed he will need repeat las and to see MD for further evaluation r/t changes.  Pt verbalized understanding and agreed w/ plan.  Pt scheduled to establish care w/ Dr. Allyson Sabal since Dr. Alanda Amass has retired.  Appt scheduled for 1.6.15 at 3:15pm.  Pt advised to go to lab to have completed prior to appt if he would like a decision to be made in regards to Zetia.  Pt verbalized understanding and agreed w/ plan.  Lab slip left w/ samples of Zetia for pt to pick up at front desk.  Labs ordered per Samara Deist, RN: Lipid and Liver panels.

## 2013-05-13 LAB — LIPID PANEL
Cholesterol: 106 mg/dL (ref 0–200)
HDL: 45 mg/dL (ref >39)
Total CHOL/HDL Ratio: 2.4 Ratio
Triglycerides: 134 mg/dL (ref <150)
VLDL: 27 mg/dL (ref 0–40)

## 2013-05-15 ENCOUNTER — Encounter: Payer: Self-pay | Admitting: *Deleted

## 2013-05-24 ENCOUNTER — Encounter (HOSPITAL_COMMUNITY): Payer: Self-pay | Admitting: Emergency Medicine

## 2013-05-24 ENCOUNTER — Emergency Department (HOSPITAL_COMMUNITY)
Admission: EM | Admit: 2013-05-24 | Discharge: 2013-05-24 | Disposition: A | Discharge status: Home / Self Care | Payer: Medicare Other | Attending: Emergency Medicine | Admitting: Emergency Medicine

## 2013-05-24 DIAGNOSIS — N189 Chronic kidney disease, unspecified: Secondary | ICD-10-CM | POA: Insufficient documentation

## 2013-05-24 DIAGNOSIS — Z8673 Personal history of transient ischemic attack (TIA), and cerebral infarction without residual deficits: Secondary | ICD-10-CM | POA: Insufficient documentation

## 2013-05-24 DIAGNOSIS — Z860100 Personal history of colon polyps, unspecified: Secondary | ICD-10-CM | POA: Insufficient documentation

## 2013-05-24 DIAGNOSIS — R55 Syncope and collapse: Secondary | ICD-10-CM | POA: Insufficient documentation

## 2013-05-24 DIAGNOSIS — E785 Hyperlipidemia, unspecified: Secondary | ICD-10-CM | POA: Insufficient documentation

## 2013-05-24 DIAGNOSIS — Z79899 Other long term (current) drug therapy: Secondary | ICD-10-CM | POA: Insufficient documentation

## 2013-05-24 DIAGNOSIS — Z87448 Personal history of other diseases of urinary system: Secondary | ICD-10-CM | POA: Insufficient documentation

## 2013-05-24 DIAGNOSIS — R404 Transient alteration of awareness: Secondary | ICD-10-CM | POA: Insufficient documentation

## 2013-05-24 DIAGNOSIS — E119 Type 2 diabetes mellitus without complications: Secondary | ICD-10-CM | POA: Insufficient documentation

## 2013-05-24 DIAGNOSIS — Z8546 Personal history of malignant neoplasm of prostate: Secondary | ICD-10-CM | POA: Insufficient documentation

## 2013-05-24 DIAGNOSIS — IMO0002 Reserved for concepts with insufficient information to code with codable children: Secondary | ICD-10-CM | POA: Insufficient documentation

## 2013-05-24 DIAGNOSIS — Z9089 Acquired absence of other organs: Secondary | ICD-10-CM | POA: Insufficient documentation

## 2013-05-24 DIAGNOSIS — I129 Hypertensive chronic kidney disease with stage 1 through stage 4 chronic kidney disease, or unspecified chronic kidney disease: Secondary | ICD-10-CM | POA: Insufficient documentation

## 2013-05-24 DIAGNOSIS — K529 Noninfective gastroenteritis and colitis, unspecified: Secondary | ICD-10-CM

## 2013-05-24 DIAGNOSIS — Z87891 Personal history of nicotine dependence: Secondary | ICD-10-CM | POA: Insufficient documentation

## 2013-05-24 DIAGNOSIS — Z7982 Long term (current) use of aspirin: Secondary | ICD-10-CM | POA: Insufficient documentation

## 2013-05-24 DIAGNOSIS — I2510 Atherosclerotic heart disease of native coronary artery without angina pectoris: Secondary | ICD-10-CM | POA: Insufficient documentation

## 2013-05-24 DIAGNOSIS — K5289 Other specified noninfective gastroenteritis and colitis: Secondary | ICD-10-CM | POA: Insufficient documentation

## 2013-05-24 LAB — CBC WITH DIFFERENTIAL/PLATELET
Basophils Absolute: 0 10*3/uL (ref 0.0–0.1)
Basophils Relative: 0 % (ref 0–1)
Eosinophils Absolute: 0 10*3/uL (ref 0.0–0.7)
MCH: 30.5 pg (ref 26.0–34.0)
MCHC: 33.9 g/dL (ref 30.0–36.0)
Monocytes Relative: 4 % (ref 3–12)
Neutrophils Relative %: 92 % — ABNORMAL HIGH (ref 43–77)
Platelets: 155 10*3/uL (ref 150–400)
RDW: 13.3 % (ref 11.5–15.5)

## 2013-05-24 LAB — COMPREHENSIVE METABOLIC PANEL
Albumin: 4.5 g/dL (ref 3.5–5.2)
Alkaline Phosphatase: 60 U/L (ref 39–117)
BUN: 24 mg/dL — ABNORMAL HIGH (ref 6–23)
Potassium: 4.4 mEq/L (ref 3.5–5.1)
Sodium: 142 mEq/L (ref 135–145)
Total Protein: 8.3 g/dL (ref 6.0–8.3)

## 2013-05-24 LAB — TROPONIN I: Troponin I: <0.3 ng/mL (ref <0.30)

## 2013-05-24 MED ORDER — ONDANSETRON HCL 4 MG/2ML IJ SOLN
4.0000 mg | Freq: Once | INTRAMUSCULAR | Status: AC
  Administered 2013-05-24: 4 mg via INTRAVENOUS
  Filled 2013-05-24: qty 2

## 2013-05-24 MED ORDER — SODIUM CHLORIDE 0.9 % IV SOLN
Freq: Once | INTRAVENOUS | Status: AC
  Administered 2013-05-24: 07:00:00 via INTRAVENOUS

## 2013-05-24 MED ORDER — ONDANSETRON 8 MG PO TBDP: ORAL_TABLET | ORAL | Status: DC

## 2013-05-24 MED ORDER — KETOROLAC TROMETHAMINE 30 MG/ML IJ SOLN
30.0000 mg | Freq: Once | INTRAMUSCULAR | Status: AC
  Administered 2013-05-24: 30 mg via INTRAVENOUS
  Filled 2013-05-24: qty 1

## 2013-05-24 NOTE — ED Provider Notes (Signed)
CSN: [id number]     Arrival date & time 05/24/13  0607 History   First MD Initiated Contact with Patient 05/24/13 [phone]     Chief Complaint  Patient presents with  . Loss of Consciousness   (Consider location/radiation/quality/duration/timing/severity/associated sxs/prior Treatment) HPI Comments: Patient is a 79 year old male with history of coronary artery disease, high cholesterol. Presents today with complaints of severe nausea and vomiting which started last night about midnight. He has vomited several times but denies diarrhea. He also denies abdominal pain. He vomited to the point where he actually had a syncopal episode and 911 was called. He was transported here for evaluation. He continues to complain of nausea and has vomited while in the ER. He reports his wife was sick 2 days ago with a similar illness.  Patient is a 79 y.o. male presenting with vomiting. The history is provided by the patient.  Emesis Severity:  Severe Duration:  7 hours Timing:  Constant Quality:  Stomach contents Progression:  Worsening Chronicity:  New Recent urination:  Normal Relieved by:  Nothing Worsened by:  Nothing tried Ineffective treatments:  None tried Associated symptoms: no abdominal pain, no fever and no headaches     Past Medical History  Diagnosis Date  . Allergic rhinitis   . Smoker   . HTN (hypertension)   . CAD (coronary artery disease)   . CVA (cerebral infarction)   . Hyperlipidemia     echo 10-25-2010 mild abnormalities  . IBS (irritable bowel syndrome)   . Diverticulitis   . Hemorrhoids   . BPH (benign prostatic hyperplasia)   . Prostate cancer 2008    seed implantation  . Low back pain   . Adenomatous colon polyp   . Sleep apnea     last study was aborted.  Has never worn a CPAP.    . Diabetes mellitus     "Borderline"  . Chronic kidney disease     Sones. Prostate cancer  . GERD (gastroesophageal reflux disease)     history   Past Surgical History  Procedure  Laterality Date  . Colonoscopy  10/21/2008    diverticulosis, radiation proctitis, internal hemorrhoids  . Appendectomy    . Lumbar laminectomy    . Transurethral resection of prostate  10/2003  . Lithrotripsy       x 3  . Esophageal dilitation    . Cataract with lens      Bilateral  . Endarterectomy  11/12/2011    Procedure: ENDARTERECTOMY CAROTID;  Surgeon: Larina Earthly, MD;  Location: [facility] OR;  Service: Vascular;  Laterality: Right;   Family History  Problem Relation Age of Onset  . Heart disease Father    History  Substance Use Topics  . Smoking status: Former Smoker -- 1.50 packs/day for 60 years    Types: Cigarettes    Quit date: 07/25/2008  . Smokeless tobacco: Never Used  . Alcohol Use: 2.0 oz/week    4 drink(s) per week     Comment: Occ    Review of Systems  Gastrointestinal: Positive for vomiting. Negative for abdominal pain.  Neurological: Negative for headaches.  All other systems reviewed and are negative.    Allergies  Review of patient's allergies indicates no known allergies.  Home Medications   Current Outpatient Rx  Name  Route  Sig  Dispense  Refill  . amLODipine (NORVASC) 10 MG tablet   Oral   Take 10 mg by mouth daily.           Marland Kitchen  aspirin EC 81 MG tablet   Oral   Take 81 mg by mouth every evening.          . cholecalciferol (VITAMIN D) 1000 UNITS tablet   Oral   Take 1,000 Units by mouth daily.           Marland Kitchen ezetimibe (ZETIA) 10 MG tablet   Oral   Take 1 tablet (10 mg total) by mouth every evening.   14 tablet   0     Lot: [id number] Exp: 07/2015   . fluticasone (FLONASE) 50 MCG/ACT nasal spray   Nasal   Place 2 sprays into the nose daily.   16 g   11   . losartan-hydrochlorothiazide (HYZAAR) 100-25 MG per tablet   Oral   Take 1 tablet by mouth daily.   30 tablet   0   . losartan-hydrochlorothiazide (HYZAAR) 100-25 MG per tablet   Oral   Take 1 tablet by mouth daily.         . metoprolol succinate (TOPROL-XL) 25 MG 24  hr tablet   Oral   Take 1 tablet (25 mg total) by mouth every evening. Take with or immediately following a meal.   90 tablet   1   . potassium chloride SA (K-DUR,KLOR-CON) 20 MEQ tablet   Oral   Take 0.5 tablets (10 mEq total) by mouth daily.   90 tablet   3   . simvastatin (ZOCOR) 40 MG tablet   Oral   Take 1 tablet (40 mg total) by mouth at bedtime.   90 tablet   3   . vitamin C (ASCORBIC ACID) 500 MG tablet   Oral   Take 500 mg by mouth daily.          . Wheat Dextrin (BENEFIBER) POWD   Oral   Take 1 packet by mouth every morning.         . zolpidem (AMBIEN) 10 MG tablet   Oral   Take 10 mg by mouth at bedtime as needed. For sleep.          BP 161/58  Pulse 84  Temp(Src) 99 F (37.2 C) (Oral)  Resp 22  SpO2 95% Physical Exam  Nursing note and vitals reviewed. Constitutional: He is oriented to person, place, and time. He appears well-developed and well-nourished. No distress.  HENT:  Head: Normocephalic and atraumatic.  Mouth/Throat: Oropharynx is clear and moist.  Neck: Normal range of motion. Neck supple.  Cardiovascular: Normal rate, regular rhythm and normal heart sounds.   No murmur heard. Pulmonary/Chest: Effort normal and breath sounds normal. No respiratory distress. He has no wheezes.  Abdominal: Soft. Bowel sounds are normal. He exhibits no distension and no mass. There is no tenderness. There is no rebound and no guarding.  Musculoskeletal: Normal range of motion. He exhibits no edema.  Neurological: He is alert and oriented to person, place, and time.  Skin: Skin is warm and dry. He is not diaphoretic.    ED Course  Procedures (including critical care time) Labs Review Labs Reviewed - No data to display Imaging Review No results found.  EKG Interpretation    Date/Time:  Sunday May 24 2013 07:42:58 EST Ventricular Rate:  85 PR Interval:  179 QRS Duration: 101 QT Interval:  357 QTC Calculation: 424 R Axis:   -15 Text  Interpretation:  Sinus rhythm Borderline left axis deviation Borderline T abnormalities, lateral leads Confirmed by DELOS  MD, Ulmer Degen ([id number]) on 05/24/2013 7:59:51 AM  MDM  No diagnosis found. Patient is a 79 year old male with history of coronary artery disease. He presents with complaints of nausea and vomiting which started at approximately midnight. It went on all night and this morning passed out during this. He denies any chest pain or shortness of breath. He was given IV fluids and Zofran in the emergency department and is now feeling much better. His workup reveals no evidence for a cardiac etiology and his electrolytes are unremarkable. His wife had similar symptoms and I strongly suspect this is a viral gastroenteritis. I do not feel as though further cardiac workup is indicated and that he is appropriate for discharge. He was ambulated in the department and is now feeling much better. He will be discharged with Zofran and instructed to return if his symptoms worsen or change.    Geoffery Lyons, MD 05/24/13 [phone]

## 2013-05-24 NOTE — ED Notes (Addendum)
Pt arrived from home via [facility] c/o Syncopal episode. N/V since midnight Abrasion left of left eye noted from where he landed on the carpet. Hx of stents x 2. EMS VS BP 110/56, CBG 160. Currently A&O x 4. Denies CP, SOB

## 2013-06-02 ENCOUNTER — Encounter: Payer: Self-pay | Admitting: Cardiovascular Disease

## 2013-06-02 ENCOUNTER — Ambulatory Visit (INDEPENDENT_AMBULATORY_CARE_PROVIDER_SITE_OTHER): Payer: Medicare Other | Admitting: Cardiovascular Disease

## 2013-06-02 VITALS — BP 140/68 | HR 64 | Ht 69.0 in | Wt 193.7 lb

## 2013-06-02 DIAGNOSIS — I10 Essential (primary) hypertension: Secondary | ICD-10-CM

## 2013-06-02 DIAGNOSIS — I679 Cerebrovascular disease, unspecified: Secondary | ICD-10-CM

## 2013-06-02 DIAGNOSIS — I2510 Atherosclerotic heart disease of native coronary artery without angina pectoris: Secondary | ICD-10-CM

## 2013-06-02 DIAGNOSIS — E785 Hyperlipidemia, unspecified: Secondary | ICD-10-CM

## 2013-06-02 DIAGNOSIS — Z79899 Other long term (current) drug therapy: Secondary | ICD-10-CM

## 2013-06-02 DIAGNOSIS — I739 Peripheral vascular disease, unspecified: Secondary | ICD-10-CM

## 2013-06-02 MED ORDER — ATORVASTATIN CALCIUM 40 MG PO TABS: 40.0000 mg | ORAL_TABLET | Freq: Every day | ORAL | Status: DC

## 2013-06-02 NOTE — Assessment & Plan Note (Signed)
On statin therapy with recent lipid profile performed 05/06/13 revealing a total cholesterol of 106, LDL 34 and HDL of 45

## 2013-06-02 NOTE — Assessment & Plan Note (Signed)
History of right carotid endarterectomy performed by Dr. Curt Jews June of 2013. We've been following this by duplex ultrasound in our office. This was last checked 03/27/12 revealing a widely patent right endarterectomy site with moderate left internal carotid artery stenosis.

## 2013-06-02 NOTE — Progress Notes (Signed)
06/02/2013 Dillon Young   Mar 15, 1933  [id number]  Primary Physician Dillon Young,Dillon Young Dillon Mages, MD Primary Cardiologist: Dillon Harp MD Dillon Young   HPI:  [name] is a delightful 80 year old married Caucasian male father of 2 children, grandfather to 2 grandchildren he was formally a patient of Dr. Delfino Lovett Lowella Young. I am assuming his care. 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 He has a history of coronary artery disease status post angioplasty in 1990 09/13/1993 circumflex coronary artery. He has not required cardiac catheterization since stress Myoview was performed in 2011 that was low risk and nonischemic. He has a history of hypertension and hyperlipidemia on medication. He had right carotid endarterectomy performed by Dr. Sherren Young Early June 2013 followed by duplex ultrasound.   Current Outpatient Prescriptions  Medication Sig Dispense Refill  . amLODipine (NORVASC) 10 MG tablet Take 10 mg by mouth daily.        Marland Kitchen aspirin EC 81 MG tablet Take 81 mg by mouth every evening.       . cholecalciferol (VITAMIN D) 1000 UNITS tablet Take 1,000 Units by mouth daily.        . fluticasone (FLONASE) 50 MCG/ACT nasal spray Place 2 sprays into the nose daily.  16 g  11  . losartan (COZAAR) 50 MG tablet Take 50 mg by mouth daily.       Marland Kitchen losartan-hydrochlorothiazide (HYZAAR) 100-25 MG per tablet Take 1 tablet by mouth daily.  30 tablet  0  . metoprolol succinate (TOPROL-XL) 25 MG 24 hr tablet Take 1 tablet (25 mg total) by mouth every evening. Take with or immediately following a meal.  90 tablet  1  . potassium chloride SA (K-DUR,KLOR-CON) 20 MEQ tablet Take 0.5 tablets (10 mEq total) by mouth daily.  90 tablet  3  . vitamin C (ASCORBIC ACID) 500 MG tablet Take 500 mg by mouth daily.       . Wheat Dextrin (BENEFIBER) POWD Take 1 packet by mouth every morning.      . zolpidem (AMBIEN) 10 MG tablet Take 10 mg by mouth at bedtime as needed. For sleep.      Marland Kitchen  atorvastatin (LIPITOR) 40 MG tablet Take 1 tablet (40 mg total) by mouth daily.  90 tablet  3   No current facility-administered medications for this visit.    No Known Allergies  History   Social History  . Marital Status: Married    Spouse Name: N/A    Number of Children: 2  . Years of Education: N/A   Occupational History  . [employer]    Social History Main Topics  . Smoking status: Former Smoker -- 1.50 packs/day for 60 years    Types: Cigarettes    Quit date: 07/25/2008  . Smokeless tobacco: Never Used  . Alcohol Use: 2.0 oz/week    4 drink(s) per week     Comment: Occ  . Drug Use: No  . Sexual Activity: Not on file   Other Topics Concern  . Not on file   Social History Narrative  . No narrative on file     Review of Systems: General: negative for chills, fever, night sweats or weight changes.  Cardiovascular: negative for chest pain, dyspnea on exertion, edema, orthopnea, palpitations, paroxysmal nocturnal dyspnea or shortness of breath Dermatological: negative for rash Respiratory: negative for cough or wheezing Urologic: negative for hematuria Abdominal: negative for nausea, vomiting, diarrhea, bright red blood per  rectum, melena, or hematemesis Neurologic: negative for visual changes, syncope, or dizziness All other systems reviewed and are otherwise negative except as noted above.    Blood pressure 140/68, pulse 64, height 5\' 9"  (1.753 m), weight 193 lb 11.2 oz (87.862 kg).  General appearance: alert and no distress Neck: no adenopathy, no JVD, supple, symmetrical, trachea midline, thyroid not enlarged, symmetric, no tenderness/mass/nodules and soft bilateral carotid bruits Lungs: clear to auscultation bilaterally Heart: regular rate and rhythm, S1, S2 normal, no murmur, click, rub or gallop Abdomen: soft, non-tender; bowel sounds normal; no masses,  no organomegaly Extremities: extremities normal, atraumatic, no cyanosis or edema Pulses:  2+ and symmetric  EKG normal sinus rhythm at 64 with no ST or T wave changes  ASSESSMENT AND PLAN:   CORONARY ARTERY DISEASE History of coronary angioplasty remotely in 1994 and 1995 the circumflex coronary artery. He's not had a cardiac catheterization since. His last Myoview performed in 2001 was low risk with no ischemia. He denies chest pain or shortness of breath.  HYPERLIPIDEMIA On statin therapy with recent lipid profile performed 05/06/13 revealing a total cholesterol of 106, LDL 34 and HDL of 45  HYPERTENSION Well-controlled on current medications  CEREBROVASCULAR DISEASE History of right carotid endarterectomy performed by Dr. Curt Young June of 2013. We've been following this by duplex ultrasound in our office. This was last checked 03/27/12 revealing a widely patent right endarterectomy site with moderate left internal carotid artery stenosis.      Dillon Harp MD FACP,FACC,FAHA, [facility] 06/02/2013 4:24 PM

## 2013-06-02 NOTE — Assessment & Plan Note (Signed)
Well-controlled on current medications 

## 2013-06-02 NOTE — Assessment & Plan Note (Signed)
History of coronary angioplasty remotely in 1994 and 1995 the circumflex coronary artery. He's not had a cardiac catheterization since. His last Myoview performed in 2001 was low risk with no ischemia. He denies chest pain or shortness of breath.

## 2013-06-02 NOTE — Patient Instructions (Signed)
  Your physician wants you to follow-up with him in : 1 year with Dr [name]                                            and with an extender in : 6 months                     You will receive a reminder letter in the mail one month in advance. If you don't receive a letter, please call our office to schedule the follow-up appointment.   Your physician recommends that you return for lab work in: 3 months, fasting   Your physician has recommended you make the following change in your medication: stop the zetia and simvastatin; start atorvastain 40mg  daily   Your physician has ordered the following tests: carotid dopplers

## 2013-06-03 ENCOUNTER — Telehealth: Payer: Self-pay | Admitting: Cardiovascular Disease

## 2013-06-03 ENCOUNTER — Encounter: Payer: Self-pay | Admitting: Cardiovascular Disease

## 2013-06-03 ENCOUNTER — Telehealth (HOSPITAL_COMMUNITY): Payer: Self-pay | Admitting: *Deleted

## 2013-06-03 MED ORDER — ATORVASTATIN CALCIUM 40 MG PO TABS: 40.0000 mg | ORAL_TABLET | Freq: Every day | ORAL | Status: DC

## 2013-06-03 NOTE — Telephone Encounter (Signed)
Returning your call from yesterday-please call him today.

## 2013-06-03 NOTE — Telephone Encounter (Signed)
Per the answering service from yesterday at 5:16 -please call call concerning his mail order medicine.He did not name the medicine,just said he needed a 3 month supply.

## 2013-06-03 NOTE — Telephone Encounter (Signed)
Medication sent to pharmacy [facility]

## 2013-06-10 ENCOUNTER — Ambulatory Visit (HOSPITAL_COMMUNITY)
Admission: RE | Admit: 2013-06-10 | Discharge: 2013-06-10 | Disposition: A | Discharge status: Home / Self Care | Payer: Medicare Other | Source: Ambulatory Visit | Attending: Cardiovascular Disease | Admitting: Cardiovascular Disease

## 2013-06-10 DIAGNOSIS — I672 Cerebral atherosclerosis: Secondary | ICD-10-CM | POA: Insufficient documentation

## 2013-06-10 DIAGNOSIS — I679 Cerebrovascular disease, unspecified: Secondary | ICD-10-CM

## 2013-06-10 DIAGNOSIS — I739 Peripheral vascular disease, unspecified: Secondary | ICD-10-CM

## 2013-06-10 DIAGNOSIS — I6529 Occlusion and stenosis of unspecified carotid artery: Secondary | ICD-10-CM

## 2013-06-10 NOTE — Progress Notes (Signed)
Carotid Duplex Completed. Matricia Begnaud, BS, RDMS, RVT  

## 2013-06-16 ENCOUNTER — Encounter: Payer: Self-pay | Admitting: *Deleted

## 2013-06-16 ENCOUNTER — Telehealth: Payer: Self-pay | Admitting: *Deleted

## 2013-06-16 DIAGNOSIS — I6529 Occlusion and stenosis of unspecified carotid artery: Secondary | ICD-10-CM

## 2013-06-16 NOTE — Telephone Encounter (Signed)
Order placed for repeat carotid doppler in 1 year 

## 2013-06-16 NOTE — Telephone Encounter (Signed)
Message copied by Chauncy Lean on Tue Jun 16, 2013 10:34 AM ------      Message from: [name]      Created: Sun Jun 14, 2013  4:39 PM       No change from prior study. Repeat in 12 months. ------

## 2013-07-01 ENCOUNTER — Ambulatory Visit (INDEPENDENT_AMBULATORY_CARE_PROVIDER_SITE_OTHER): Payer: Medicare Other | Admitting: Family Medicine

## 2013-07-01 ENCOUNTER — Encounter: Payer: Self-pay | Admitting: Family Medicine

## 2013-07-01 VITALS — BP 138/56 | HR 65 | Temp 98.0°F | Ht 69.0 in | Wt 194.0 lb

## 2013-07-01 DIAGNOSIS — I2510 Atherosclerotic heart disease of native coronary artery without angina pectoris: Secondary | ICD-10-CM

## 2013-07-01 DIAGNOSIS — E785 Hyperlipidemia, unspecified: Secondary | ICD-10-CM

## 2013-07-01 DIAGNOSIS — R7309 Other abnormal glucose: Secondary | ICD-10-CM

## 2013-07-01 DIAGNOSIS — I10 Essential (primary) hypertension: Secondary | ICD-10-CM

## 2013-07-01 LAB — HEMOGLOBIN A1C: Hgb A1c MFr Bld: 6.6 % — ABNORMAL HIGH (ref 4.6–6.5)

## 2013-07-01 NOTE — Progress Notes (Signed)
Pre visit review using our clinic review tool, if applicable. No additional management support is needed unless otherwise documented below in the visit note. 

## 2013-07-01 NOTE — Progress Notes (Signed)
   Subjective:    Patient ID: SAFIR MICHALEC, male    DOB: 02/18/1933, 80 y.o.   MRN: [id number]  HPI 80 yr old male to establish with [place] after transferring from Dr. Lenna Gilford. He feels fine and has no concerns. He sees Dr. [name] regularly for his cardiac issues and he will see Dr. [name] soon for a prostate follow up. He has diet controlled diabetes, and his last A1c a little over a year ago was 7.1. 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 He walks with his wife in the mall 4-5 days a week.   Review of Systems  Unable to perform ROS Constitutional: Negative.   Respiratory: Negative.   Cardiovascular: Negative.   Gastrointestinal: Negative.   Endocrine: Negative.   Genitourinary: Negative.   Neurological: Negative.        Objective:   Physical Exam  Constitutional: He is oriented to person, place, and time. He appears well-developed and well-nourished.  Neck: No thyromegaly present.  Cardiovascular: Normal rate, regular rhythm, normal heart sounds and intact distal pulses.   Pulmonary/Chest: Effort normal and breath sounds normal.  Lymphadenopathy:    He has no cervical adenopathy.  Neurological: He is alert and oriented to person, place, and time.          Assessment & Plan:  He seems to be doing well. We will get an A1c today

## 2013-08-03 ENCOUNTER — Telehealth: Payer: Self-pay | Admitting: Family Medicine

## 2013-08-03 NOTE — Telephone Encounter (Signed)
Patient Information:  Caller Name: Jomes  Phone: [phone]  Patient: Dillon Young, Dillon Young  Gender: Male  DOB: [birth year]  Age: 80 Years  PCP: Alysia Penna [facility])  Office Follow Up:  Does the office need to follow up with this patient?: Yes  Instructions For The Office: Patient is concerned about elevations in blood pressure in 160"s for over a week. Upcoming physical at work and they are strict. Please review the above blood pressures today .  He wants to take an extra pill, he wants to know which one he can take? PLEASE CONTACT.  RN Note:  Patient is concerned about elevations in blood pressure in 160"s for over a week. Upcoming physical at work and they are strict. Please review the above blood pressures today .  He wants to take an extra pill, he wants to know which one he can take? PLEASE CONTACT.  Symptoms  Reason For Call & Symptoms: Patient states he has a history of High Blood pressure and is on medication. He has noted within the last week it is running higher than normal. He is currently on Losartan HCTZ  , Cozaar, Toprol XL, Norvasc. He is compliant.  He is concerned because he has a physical at work and needs his pressure down.  Today- 167/60, 157/46, 152/69.Marland Kitchen  cholesterol medication was changed one month ago to Lipitor.  Reviewed Health History In EMR: Yes  Reviewed Medications In EMR: Yes  Reviewed Allergies In EMR: Yes  Reviewed Surgeries / Procedures: Yes  Date of Onset of Symptoms: 07/27/2013  Guideline(s) Used:  High Blood Pressure  Disposition Per Guideline:   See Within 2 Weeks in Office  Reason For Disposition Reached:   BP > 140/90 and is taking BP medications  Advice Given:  Lifestyle Changes  Maintain a healthy weight. Lose weight if you are overweight.  Do 30 minutes of aerobic physical activity (e.g., brisk walking) most days of the week.  Eat a diet high in fresh fruits and low-fat dairy products. Limit your intake of saturated and total  fat. Choose foods that are lower in salt.  If you smoke, you should stop.  If you drink alcohol, you should limit your daily alcohol drinking. Women should have no more than one drink per day. Men should have no more than 2 drinks per day. A drink is defined as 1.5 oz hard liquor (one shot or jigger; 45 ml), 5 oz wine (small glass; 150 ml), or 12 oz beer (one can; 360 ml).  What Is A Healthy Diet?  Eat a variety of vegetables and fruits. Aim for five servings per day.  Eat a variety of grains. Aim for six servings per day.  Eat fish at least two times each week. Fatty fish such as salmon and tuna are best.  Choose:  Choose reduced-fat dairy skinless poultry and lean meats. These are healthier than red meat.  Choose fats with no more than two grams of saturated fat per tablespoon. Examples include liquid and tub margarine, canola, corn, safflower, and olive oil.  Limit:  Limit your sugar intake. Avoid high-calorie, low-nutrition foods like soft drinks and candy.  Limit foods high in saturated fat, trans-fat and cholesterol.  Limit alcoholic beverages to no more than one drink per day for women and no more than two drinks for men.  Call Back If:  You have more questions.  How Much Sodium (Salt) Should You Eat Each Day?  Aim to eat less than 1,500 mg of sodium  each day.  Remember that one teaspoon of salt has 2,300 mg of sodium.  Unfortunately, 75% of the salt in the average person's diet is in pre-processed foods.  Call Back If:  Headache, blurred vision, difficulty talking, or difficulty walking occurs  Chest pain or difficulty breathing occurs  You become worse.  RN Overrode Recommendation:  Document Patient  Patient is concerned about elevations in blood pressure in 160"s for over a week. Upcoming physical at work and they are strict. Please review the above blood pressures today .  He wants to take an extra pill, he wants to know which one he can take? PLEASE CONTACT.

## 2013-08-03 NOTE — Telephone Encounter (Signed)
He needs an OV. We can see him tomorrow

## 2013-08-03 NOTE — Telephone Encounter (Signed)
I spoke with pt and he is going to schedule the office visit for 08/04/13 to see Dr. Sarajane Jews.

## 2013-09-11 LAB — HEPATIC FUNCTION PANEL
ALBUMIN: 4.3 g/dL (ref 3.5–5.2)
ALK PHOS: 50 U/L (ref 39–117)
ALT: 13 U/L (ref 0–53)
AST: 16 U/L (ref 0–37)
BILIRUBIN INDIRECT: 0.6 mg/dL (ref 0.2–1.2)
Bilirubin, Direct: 0.1 mg/dL (ref 0.0–0.3)
TOTAL PROTEIN: 6.7 g/dL (ref 6.0–8.3)
Total Bilirubin: 0.7 mg/dL (ref 0.2–1.2)

## 2013-09-11 LAB — LIPID PANEL
CHOL/HDL RATIO: 3.4 ratio
Cholesterol: 131 mg/dL (ref 0–200)
HDL: 38 mg/dL — AB (ref >39)
LDL CALC: 67 mg/dL (ref 0–99)
Triglycerides: 132 mg/dL (ref <150)
VLDL: 26 mg/dL (ref 0–40)

## 2013-09-18 ENCOUNTER — Telehealth: Payer: Self-pay | Admitting: Family Medicine

## 2013-09-18 NOTE — Telephone Encounter (Signed)
[facility] - [place], [place] - [address] is requesting re-fill on losartan-hydrochlorothiazide (HYZAAR) 100-25 MG per tablet °

## 2013-09-22 MED ORDER — LOSARTAN POTASSIUM-HCTZ 100-25 MG PO TABS: 1.0000 | ORAL_TABLET | Freq: Every day | ORAL | Status: DC

## 2013-09-22 NOTE — Telephone Encounter (Signed)
Per Dr. Sarajane Jews okay to refill and take Cozaar off current medication list.

## 2013-10-22 ENCOUNTER — Encounter: Payer: Self-pay | Admitting: Internal Medicine

## 2013-12-03 ENCOUNTER — Telehealth: Payer: Self-pay | Admitting: Cardiovascular Disease

## 2013-12-03 ENCOUNTER — Other Ambulatory Visit: Payer: Self-pay | Admitting: *Deleted

## 2013-12-03 MED ORDER — METOPROLOL SUCCINATE ER 25 MG PO TB24: 25.0000 mg | ORAL_TABLET | Freq: Every evening | ORAL | Status: DC

## 2013-12-03 MED ORDER — LOSARTAN POTASSIUM 50 MG PO TABS: 50.0000 mg | ORAL_TABLET | Freq: Every day | ORAL | Status: DC

## 2013-12-03 NOTE — Telephone Encounter (Signed)
Closed encounter °

## 2013-12-03 NOTE — Telephone Encounter (Signed)
Pt need  new prescriptions for his Losartan 50 mg # 90 and 3 refills, also generic Metoprolol 25 mg #90 and 3 refills.. Please call to [facility] [phone].

## 2013-12-15 ENCOUNTER — Ambulatory Visit (INDEPENDENT_AMBULATORY_CARE_PROVIDER_SITE_OTHER): Payer: Medicare Other | Admitting: Internal Medicine

## 2013-12-15 ENCOUNTER — Encounter: Payer: Self-pay | Admitting: Internal Medicine

## 2013-12-15 VITALS — BP 110/50 | HR 56 | Ht 69.0 in | Wt 196.2 lb

## 2013-12-15 DIAGNOSIS — K589 Irritable bowel syndrome without diarrhea: Secondary | ICD-10-CM

## 2013-12-15 DIAGNOSIS — K5730 Diverticulosis of large intestine without perforation or abscess without bleeding: Secondary | ICD-10-CM

## 2013-12-15 DIAGNOSIS — K5900 Constipation, unspecified: Secondary | ICD-10-CM

## 2013-12-15 DIAGNOSIS — K5909 Other constipation: Secondary | ICD-10-CM

## 2013-12-15 DIAGNOSIS — R141 Gas pain: Secondary | ICD-10-CM

## 2013-12-15 DIAGNOSIS — R143 Flatulence: Secondary | ICD-10-CM

## 2013-12-15 DIAGNOSIS — K642 Third degree hemorrhoids: Secondary | ICD-10-CM

## 2013-12-15 DIAGNOSIS — K649 Unspecified hemorrhoids: Secondary | ICD-10-CM

## 2013-12-15 DIAGNOSIS — R140 Abdominal distension (gaseous): Secondary | ICD-10-CM

## 2013-12-15 DIAGNOSIS — K648 Other hemorrhoids: Secondary | ICD-10-CM

## 2013-12-15 DIAGNOSIS — R142 Eructation: Secondary | ICD-10-CM

## 2013-12-15 MED ORDER — ALIGN PO CAPS: ORAL_CAPSULE | ORAL | Status: DC

## 2013-12-15 MED ORDER — DICYCLOMINE HCL 20 MG PO TABS: 20.0000 mg | ORAL_TABLET | Freq: Four times a day (QID) | ORAL | Status: DC | PRN

## 2013-12-15 NOTE — Assessment & Plan Note (Addendum)
Refilled dicyclomine. Of what he has may be symptomatic diverticulosis. He is elderly but has tolerated dicyclomine in the past so I think it's okay to refill that. A trial of align probiotic is recommended also.

## 2013-12-15 NOTE — Patient Instructions (Addendum)
Please call us back if you decide to do the hemorrhoid banding, information provided today.  We have sent the following medications to your pharmacy for you to pick up at your convenience: Generic Bentyl,  Take 1/2 tablet if whole tablet too much.  Please take align one daily to put the good bacteria back into your colon.  Coupon provided.  Take this for one month.  Today you have been given a handout on benefiber, work up to 2 tablespoons daily.   I appreciate the opportunity to care for you.   Dr. Carlean Purl sent a rx for your wife in today as well at your request.

## 2013-12-15 NOTE — Assessment & Plan Note (Signed)
Change back to Benefiber. Try align probiotic for one month.

## 2013-12-15 NOTE — Assessment & Plan Note (Addendum)
I reviewed the banding procedure to consider. He might be at somewhat increased risk of bleeding given prior history of radiation for prostate cancer. Overall though with his grade 3 hemorrhoids I think it could help him would be cautious in the longer intervals between banding most likely.

## 2013-12-15 NOTE — Progress Notes (Signed)
   Subjective:    Patient ID: Dillon Young, male    DOB: June 27, 1932, 80 y.o.   MRN: [id number]  HPI The patient is here for followup, having last been seen in 2013. He has problems with chronic constipation though most days out of the week he is okay, a few times a month he gets constipated. He was using Benefiber but is now using Shelbina. He gets pre-defecatory crampy left lower quadrant pain at times as well there is fairly intense and would like a refill on dicyclomine. Hemorrhoids continue to prolapse intermittently and need to be pushed back in consistent with known grade 3 hemorrhoids. Last colonoscopy 5 years ago did not show polyps he has a history of some small polyps in the past. I had sent him a letter saying he did not need further routine colonoscopy. He accepts that. Also c/o bloating. Medications, allergies, past medical history, past surgical history, family history and social history are reviewed and updated in the EMR.   Review of Systems As above    Objective:   Physical Exam General:  NAD Eyes:   anicteric Abdomen:  soft and nontender, BS+, ventral hernia (diastasis recti) Ext:   no edema    Data Reviewed:  Prior GI notes    Assessment & Plan:  Internal Hemorrhoids with Prolapse and Bleeding (Grade III) I reviewed the banding procedure to consider. He might be at somewhat increased risk of bleeding given prior history of radiation for prostate cancer. Overall though with his grade 3 hemorrhoids I think it could help him would be cautious in the longer intervals between banding most likely.  Constipation, chronic Change back to Benefiber. Try align probiotic for one month.  Irritable bowel syndrome Refilled dicyclomine. Of what he has may be symptomatic diverticulosis. He is elderly but has tolerated dicyclomine in the past so I think it's okay to refill that. A trial of align probiotic is recommended also.

## 2013-12-16 ENCOUNTER — Telehealth: Payer: Self-pay | Admitting: Internal Medicine

## 2013-12-16 NOTE — Telephone Encounter (Signed)
Appointment made for 01/13/14 at 1:45pm with Dr. Carlean Purl for hemorrhoid banding.

## 2014-01-06 ENCOUNTER — Telehealth: Payer: Self-pay | Admitting: Family Medicine

## 2014-01-06 NOTE — Telephone Encounter (Signed)
Pt request refill of the following: losartan-hydrochlorothiazide (HYZAAR) 100-25 MG per tablet  Pt said he need this rx asap  He has none . His rx from [facility] will not be here till next week    Pharmacy; [facility]  [address]  [phone]

## 2014-01-06 NOTE — Telephone Encounter (Signed)
Pt needs to change the pharm for this refill [facility] [address] [address]

## 2014-01-07 ENCOUNTER — Other Ambulatory Visit: Payer: Self-pay | Admitting: *Deleted

## 2014-01-07 MED ORDER — LOSARTAN POTASSIUM-HCTZ 100-25 MG PO TABS: 1.0000 | ORAL_TABLET | Freq: Every day | ORAL | Status: DC

## 2014-01-07 MED ORDER — AMLODIPINE BESYLATE 10 MG PO TABS: 10.0000 mg | ORAL_TABLET | Freq: Every day | ORAL | Status: DC

## 2014-01-07 NOTE — Telephone Encounter (Signed)
#  30 sent to the pharmacy.  LMOM to inform the pt.

## 2014-01-08 ENCOUNTER — Encounter: Payer: Self-pay | Admitting: Internal Medicine

## 2014-01-13 ENCOUNTER — Encounter: Payer: Self-pay | Admitting: Internal Medicine

## 2014-01-13 ENCOUNTER — Ambulatory Visit (INDEPENDENT_AMBULATORY_CARE_PROVIDER_SITE_OTHER): Payer: Medicare Other | Admitting: Internal Medicine

## 2014-01-13 VITALS — BP 112/60 | HR 48 | Ht 67.74 in | Wt 194.2 lb

## 2014-01-13 DIAGNOSIS — K649 Unspecified hemorrhoids: Secondary | ICD-10-CM

## 2014-01-13 NOTE — Patient Instructions (Addendum)
HEMORRHOID BANDING PROCEDURE    FOLLOW-UP CARE   1. The procedure you have had should have been relatively painless since the banding of the area involved does not have nerve endings and there is no pain sensation.  The rubber band cuts off the blood supply to the hemorrhoid and the band may fall off as soon as 48 hours after the banding (the band may occasionally be seen in the toilet bowl following a bowel movement). You may notice a temporary feeling of fullness in the rectum which should respond adequately to plain Tylenol or Motrin.  2. Following the banding, avoid strenuous exercise that evening and resume full activity the next day.  A sitz bath (soaking in a warm tub) or bidet is soothing, and can be useful for cleansing the area after bowel movements.     3. To avoid constipation, take two tablespoons of natural wheat bran, natural oat bran, flax, Benefiber or any over the counter fiber supplement and increase your water intake to 7-8 glasses daily.    4. Unless you have been prescribed anorectal medication, do not put anything inside your rectum for two weeks: No suppositories, enemas, fingers, etc.  5. Occasionally, you may have more bleeding than usual after the banding procedure.  This is often from the untreated hemorrhoids rather than the treated one.  Don't be concerned if there is a tablespoon or so of blood.  If there is more blood than this, lie flat with your bottom higher than your head and apply an ice pack to the area. If the bleeding does not stop within a half an hour or if you feel faint, call our office at [phone] or go to the emergency room.  6. Problems are not common; however, if there is a substantial amount of bleeding, severe pain, chills, fever or difficulty passing urine (very rare) or other problems, you should call us at [phone] or report to the nearest emergency room.  7. Do not stay seated continuously for more than 2-3 hours for a day or two  after the procedure.  Tighten your buttock muscles 10-15 times every two hours and take 10-15 deep breaths every 1-2 hours.  Do not spend more than a few minutes on the toilet if you cannot empty your bowel; instead re-visit the toilet at a later time.    We will see you in a month for your next banding.  Appointment made for 02/17/14.   I appreciate the opportunity to care for you.

## 2014-01-13 NOTE — Assessment & Plan Note (Addendum)
LL banded RTC 1 month Would do RP next w/ hx XRT

## 2014-01-14 NOTE — Progress Notes (Signed)
Patient ID: Dillon Young, male   DOB: July 10, 1932, 80 y.o.   MRN: [id number]          PROCEDURE NOTE: The patient presents with symptomatic grade 3  hemorrhoids, requesting rubber band ligation of his/her hemorrhoidal disease.  All risks, benefits and alternative forms of therapy were described and informed consent was obtained.    The decision was made to band the LL internal hemorrhoid, and the [place] was used to perform band ligation without complication.  Digital anorectal examination was then performed to assure proper positioning of the band, and to adjust the banded tissue as required.  The patient was discharged home without pain or other issues.  Dietary and behavioral recommendations were given and along with follow-up instructions.     The following adjunctive treatments were recommended:  Benefiber  The patient will return in 1 monthfor  follow-up and possible additional banding as required. No complications were encountered and the patient tolerated the procedure well.  Cc:FRY,STEPHEN A, MD

## 2014-02-17 ENCOUNTER — Telehealth: Payer: Self-pay | Admitting: Internal Medicine

## 2014-02-17 ENCOUNTER — Encounter: Payer: Self-pay | Admitting: Internal Medicine

## 2014-02-17 ENCOUNTER — Ambulatory Visit (INDEPENDENT_AMBULATORY_CARE_PROVIDER_SITE_OTHER): Payer: Medicare Other | Admitting: Internal Medicine

## 2014-02-17 VITALS — BP 138/60 | HR 68 | Ht 69.0 in | Wt 193.8 lb

## 2014-02-17 DIAGNOSIS — K649 Unspecified hemorrhoids: Secondary | ICD-10-CM

## 2014-02-17 NOTE — Assessment & Plan Note (Addendum)
RP banded RTC 2-4 weeks to reassess

## 2014-02-17 NOTE — Progress Notes (Signed)
Patient ID: Dillon Young, male   DOB: 12/16/1932, 80 y.o.   MRN: [id number]        PROCEDURE NOTE: The patient presents with symptomatic grade 3   hemorrhoids, requesting rubber band ligation of his/her hemorrhoidal disease.  All risks, benefits and alternative forms of therapy were described and informed consent was obtained.  LL hemorrhoid banded last month with significant improvement in sxs. Having some prolapse and itching still. 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).  The decision was made to band the RP internal hemorrhoid, and the [place] was used to perform band ligation without complication.  Digital anorectal examination was then performed to assure proper positioning of the band, and to adjust the banded tissue as required.  The patient was discharged home without pain or other issues.  Dietary and behavioral recommendations were given and along with follow-up instructions.     The following adjunctive treatments were recommended:  Continue Benefiber  The patient will return in 2-4 weeks - he will call -  for  follow-up and possible additional banding as required. No complications were encountered and the patient tolerated the procedure well.  I appreciate the opportunity to care for you. Cc:FRY,STEPHEN A, MD

## 2014-02-17 NOTE — Patient Instructions (Signed)
Patient will cal back to schedule next appointment. CC: Dillon Penna MD

## 2014-02-17 NOTE — Telephone Encounter (Signed)
Patient scheduled for Hem banding #2 on 04/08/14

## 2014-02-18 ENCOUNTER — Other Ambulatory Visit: Payer: Self-pay | Admitting: *Deleted

## 2014-02-18 MED ORDER — POTASSIUM CHLORIDE CRYS ER 20 MEQ PO TBCR: 10.0000 meq | EXTENDED_RELEASE_TABLET | Freq: Every day | ORAL | Status: DC

## 2014-02-23 ENCOUNTER — Telehealth: Payer: Self-pay | Admitting: Cardiovascular Disease

## 2014-02-23 NOTE — Telephone Encounter (Signed)
Closed encounter °

## 2014-02-24 ENCOUNTER — Other Ambulatory Visit: Payer: Self-pay

## 2014-02-24 MED ORDER — POTASSIUM CHLORIDE CRYS ER 20 MEQ PO TBCR
10.0000 meq | EXTENDED_RELEASE_TABLET | Freq: Every day | ORAL | Status: DC
Start: 2014-02-24 — End: 2014-03-01

## 2014-02-24 NOTE — Telephone Encounter (Signed)
Rx sent to pharmacy   

## 2014-02-26 ENCOUNTER — Other Ambulatory Visit: Payer: Self-pay | Admitting: Pulmonary Disease

## 2014-03-01 ENCOUNTER — Telehealth: Payer: Self-pay | Admitting: Cardiovascular Disease

## 2014-03-01 MED ORDER — POTASSIUM CHLORIDE CRYS ER 10 MEQ PO TBCR: 10.0000 meq | EXTENDED_RELEASE_TABLET | Freq: Every day | ORAL | Status: DC

## 2014-03-01 NOTE — Telephone Encounter (Signed)
Potassium changed to a 5meq tablet at patient's request.  New Rx sent to [facility] .  Patient notified.

## 2014-03-01 NOTE — Telephone Encounter (Signed)
Pt called in stating that he needs a refill of his Klor-Con 51meq. He stated that he believes that he is receiving to much of this medication because he only takes 1/2 tab a day. So he said if his prescription could be changed to 70meq instead of 20 meq that would be great and to noticy [employer] Please call  Thanks

## 2014-03-03 ENCOUNTER — Other Ambulatory Visit: Payer: Self-pay | Admitting: Pulmonary Disease

## 2014-03-03 ENCOUNTER — Ambulatory Visit: Payer: Medicare Other | Admitting: Cardiovascular Disease

## 2014-03-19 ENCOUNTER — Ambulatory Visit (INDEPENDENT_AMBULATORY_CARE_PROVIDER_SITE_OTHER): Payer: Medicare Other | Admitting: *Deleted

## 2014-03-19 DIAGNOSIS — Z23 Encounter for immunization: Secondary | ICD-10-CM

## 2014-03-26 ENCOUNTER — Ambulatory Visit: Payer: Medicare Other | Admitting: Family Medicine

## 2014-04-08 ENCOUNTER — Ambulatory Visit (INDEPENDENT_AMBULATORY_CARE_PROVIDER_SITE_OTHER): Payer: Medicare Other | Admitting: Internal Medicine

## 2014-04-08 ENCOUNTER — Encounter: Payer: Self-pay | Admitting: Internal Medicine

## 2014-04-08 VITALS — BP 118/60 | HR 67 | Ht 69.0 in | Wt 195.4 lb

## 2014-04-08 DIAGNOSIS — K648 Other hemorrhoids: Secondary | ICD-10-CM

## 2014-04-08 NOTE — Patient Instructions (Addendum)
HEMORRHOID BANDING PROCEDURE    FOLLOW-UP CARE   1. The procedure you have had should have been relatively painless since the banding of the area involved does not have nerve endings and there is no pain sensation.  The rubber band cuts off the blood supply to the hemorrhoid and the band may fall off as soon as 48 hours after the banding (the band may occasionally be seen in the toilet bowl following a bowel movement). You may notice a temporary feeling of fullness in the rectum which should respond adequately to plain Tylenol or Motrin.  2. Following the banding, avoid strenuous exercise that evening and resume full activity the next day.  A sitz bath (soaking in a warm tub) or bidet is soothing, and can be useful for cleansing the area after bowel movements.     3. To avoid constipation, take two tablespoons of natural wheat bran, natural oat bran, flax, Benefiber or any over the counter fiber supplement and increase your water intake to 7-8 glasses daily.    4. Unless you have been prescribed anorectal medication, do not put anything inside your rectum for two weeks: No suppositories, enemas, fingers, etc.  5. Occasionally, you may have more bleeding than usual after the banding procedure.  This is often from the untreated hemorrhoids rather than the treated one.  Don't be concerned if there is a tablespoon or so of blood.  If there is more blood than this, lie flat with your bottom higher than your head and apply an ice pack to the area. If the bleeding does not stop within a half an hour or if you feel faint, call our office at [phone] or go to the emergency room.  6. Problems are not common; however, if there is a substantial amount of bleeding, severe pain, chills, fever or difficulty passing urine (very rare) or other problems, you should call us at [phone] or report to the nearest emergency room.  7. Do not stay seated continuously for more than 2-3 hours for a day or two  after the procedure.  Tighten your buttock muscles 10-15 times every two hours and take 10-15 deep breaths every 1-2 hours.  Do not spend more than a few minutes on the toilet if you cannot empty your bowel; instead re-visit the toilet at a later time.    Follow up with [place] as needed and call us if you are still having symptoms.  Today we have given you a sample and coupon for recticare to use as needed.   I appreciate the opportunity to care for you.

## 2014-04-08 NOTE — Progress Notes (Signed)
   Dillon Young reports much less anal itching and no obvious prolapse of hemorrhoids today. No bleeding.  Rectal exam reveals bulging anal tissue but no overt prolapse in LL position.  Anoscopy with male staff present reveals Grade 2 LL internal hemorrhoid and Grade 1 RP, RA hemorrhoids.  PROCEDURE NOTE: The patient presents with symptomatic grade 2 hemorrhoids, requesting rubber band ligation of his/her hemorrhoidal disease.  All risks, benefits and alternative forms of therapy were described and informed consent was obtained.  The decision was made to band the LL internal hemorrhoid, and the [name] was used to perform band ligation without complication.  Digital anorectal examination was then performed to assure proper positioning of the band, and to adjust the banded tissue as required.  The patient was discharged home without pain or other issues.  Dietary and behavioral recommendations were given and along with follow-up instructions.     The following adjunctive treatments were recommended:  Benefiber 5% lidocaine cream prn  The patient will return as  Needed for  follow-up and possible additional banding as required. No complications were encountered and the patient tolerated the procedure well.

## 2014-04-08 NOTE — Assessment & Plan Note (Addendum)
LL banded (again) Continue Benefiber 5% lidocaine (Recticare) prn RTC prn - he will call first with ? If still symptomatic and will decide next steps

## 2014-04-09 ENCOUNTER — Encounter: Payer: Self-pay | Admitting: Cardiovascular Disease

## 2014-04-09 ENCOUNTER — Ambulatory Visit (INDEPENDENT_AMBULATORY_CARE_PROVIDER_SITE_OTHER): Payer: Medicare Other | Admitting: Cardiovascular Disease

## 2014-04-09 VITALS — BP 138/80 | HR 69 | Ht 69.0 in | Wt 197.2 lb

## 2014-04-09 DIAGNOSIS — I2510 Atherosclerotic heart disease of native coronary artery without angina pectoris: Secondary | ICD-10-CM

## 2014-04-09 DIAGNOSIS — I10 Essential (primary) hypertension: Secondary | ICD-10-CM

## 2014-04-09 DIAGNOSIS — E785 Hyperlipidemia, unspecified: Secondary | ICD-10-CM

## 2014-04-09 DIAGNOSIS — Z79899 Other long term (current) drug therapy: Secondary | ICD-10-CM

## 2014-04-09 MED ORDER — SIMVASTATIN 40 MG PO TABS: 40.0000 mg | ORAL_TABLET | Freq: Every day | ORAL | Status: DC

## 2014-04-09 NOTE — Assessment & Plan Note (Signed)
History of CAD status post intervention by Dr. Rollene Fare in 1990, 1994 and 95 of the circumflex coronary artery. He has not had a cardiac catheterization since. His last Myoview stress test performed 06/10/09 was nonischemic. He denies chest pain or shortness of breath.

## 2014-04-09 NOTE — Progress Notes (Signed)
04/09/2014 Dillon Young   05/31/1932  [id number]  Primary Physician Dillon Morale, MD Primary Cardiologist: Dillon Harp MD Dillon Young   HPI:  Dillon Young is a delightful 80 year old married Caucasian male father of 2 children, grandfather to 2 grandchildren he was formally a patient of Dr. Delfino Lovett Lowella Young. I am assumed his care. 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 He has a history of coronary artery disease status post angioplasty in 1990 09/13/1993 circumflex coronary artery. He has not required cardiac catheterization since stress Myoview was performed in 2011 that was low risk and nonischemic. He has a history of hypertension and hyperlipidemia on medication. He had right carotid endarterectomy performed by Dr. Sherren Young Early June 2013 followed by duplex ultrasound. He denies chest pain or shortness of breath. His major complaint is of cramping in his thighs which may be related to his statin drug.  Current Outpatient Prescriptions  Medication Sig Dispense Refill  . amLODipine (NORVASC) 10 MG tablet Take 1 tablet (10 mg total) by mouth daily. 90 tablet 2  . aspirin EC 81 MG tablet Take 81 mg by mouth every evening.     . cholecalciferol (VITAMIN D) 1000 UNITS tablet Take 1,000 Units by mouth daily.      Marland Kitchen dicyclomine (BENTYL) 20 MG tablet Take 1 tablet (20 mg total) by mouth every 6 (six) hours as needed for spasms. 120 tablet 11  . fluticasone (FLONASE) 50 MCG/ACT nasal spray Place 2 sprays into the nose as needed.    Marland Kitchen losartan (COZAAR) 50 MG tablet Take 1 tablet (50 mg total) by mouth daily. 90 tablet 3  . losartan-hydrochlorothiazide (HYZAAR) 100-25 MG per tablet Take 1 tablet by mouth daily. 30 tablet 0  . metoprolol succinate (TOPROL-XL) 25 MG 24 hr tablet Take 1 tablet (25 mg total) by mouth every evening. Take with or immediately following a meal. 90 tablet 1  . potassium chloride SA (K-DUR,KLOR-CON) 10 MEQ tablet  Take 1 tablet (10 mEq total) by mouth daily. 90 tablet 1  . vitamin C (ASCORBIC ACID) 500 MG tablet Take 500 mg by mouth daily.     . Wheat Dextrin (BENEFIBER) POWD Take 1 packet by mouth every morning.     No current facility-administered medications for this visit.    No Known Allergies  History   Social History  . Marital Status: Married    Spouse Name: N/A    Number of Children: 2  . Years of Education: N/A   Occupational History  . [employer]    Social History Main Topics  . Smoking status: Former Smoker -- 1.50 packs/day for 60 years    Types: Cigarettes    Quit date: 07/25/2008  . Smokeless tobacco: Never Used  . Alcohol Use: 0.0 oz/week     Comment: Occ  . Drug Use: No  . Sexual Activity: Not on file   Other Topics Concern  . Not on file   Social History Narrative     Review of Systems: General: negative for chills, fever, night sweats or weight changes.  Cardiovascular: negative for chest pain, dyspnea on exertion, edema, orthopnea, palpitations, paroxysmal nocturnal dyspnea or shortness of breath Dermatological: negative for rash Respiratory: negative for cough or wheezing Urologic: negative for hematuria Abdominal: negative for nausea, vomiting, diarrhea, bright red blood per rectum, melena, or hematemesis Neurologic: negative for visual changes, syncope, or dizziness All other systems reviewed and are otherwise negative except  as noted above.    Blood pressure 138/80, pulse 69, height 5\' 9"  (1.753 m), weight 197 lb 3.2 oz (89.449 kg).  General appearance: alert and no distress Neck: no adenopathy, no JVD, supple, symmetrical, trachea midline, thyroid not enlarged, symmetric, no tenderness/mass/nodules and bilateral carotid bruits left louder then right Lungs: clear to auscultation bilaterally Heart: regular rate and rhythm, S1, S2 normal, no murmur, click, rub or gallop Extremities: extremities normal, atraumatic, no cyanosis or  edema  EKG normal sinus rhythm at 69 without ST or T-wave changes. There was a unifocal PVC. I personally reviewed this EKG  ASSESSMENT AND PLAN:   Essential hypertension History of hypertension with blood pressure measured at 138/80 today. He is on losartan 50 as was losartan_thiazide 100/25 and metoprolol XL 25 mg daily. He is also on amlodipine 10 mg. We will continue his current medications at their current dose dosage.  Coronary atherosclerosis History of CAD status post intervention by Dillon Young in 1990, 1994 and 95 of the circumflex coronary artery. He has not had a cardiac catheterization since. His last Myoview stress test performed 06/10/09 was nonischemic. He denies chest pain or shortness of breath.  Cerebrovascular disease History of carotid artery disease status post remote right carotid endarterectomy for Dillon Young. Carotid Dopplers performed in our office 06/10/13 revealed a widely patent endarterectomy site with moderate left ICA stenosis. He is neurologically a symptomatically aspirin. We will continue annual surveillance.  HYPERLIPIDEMIA History of hyperlipidemia on atorvastatin 40 mg.he has been on simvastatin in the past. He complains of symptoms compatible with statin myopathy. I'm going to give him a statin holiday and restart him on simvastatin 40 mg daily. I will then heck a fasting lipid profile to determine efficacy.      Dillon Harp MD FACP,FACC,FAHA, [facility] 04/09/2014 10:17 AM

## 2014-04-09 NOTE — Patient Instructions (Addendum)
Dr. [name] would like you to STOP your cholesterol medication (Atorvastatin also known as Lipitor) for 1 month.   In 1 month BEGIN taking Simvastatin 40MG  daily.  Have blood work done 2 months after starting Simvastatin.  Your physician wants you to follow-up in 1 year with Dr. [name]. You will receive a reminder letter in the mail 2 months in advance. If you do not receive a letter, please call our office to schedule the follow-up appointment.

## 2014-04-09 NOTE — Assessment & Plan Note (Signed)
History of hypertension with blood pressure measured at 138/80 today. He is on losartan 50 as was losartan_thiazide 100/25 and metoprolol XL 25 mg daily. He is also on amlodipine 10 mg. We will continue his current medications at their current dose dosage.

## 2014-04-09 NOTE — Assessment & Plan Note (Signed)
History of carotid artery disease status post remote right carotid endarterectomy for Dr. Donnetta Hutching. Carotid Dopplers performed in our office 06/10/13 revealed a widely patent endarterectomy site with moderate left ICA stenosis. He is neurologically a symptomatically aspirin. We will continue annual surveillance.

## 2014-04-09 NOTE — Assessment & Plan Note (Signed)
History of hyperlipidemia on atorvastatin 40 mg.he has been on simvastatin in the past. He complains of symptoms compatible with statin myopathy. I'm going to give him a statin holiday and restart him on simvastatin 40 mg daily. I will then heck a fasting lipid profile to determine efficacy.

## 2014-04-12 ENCOUNTER — Other Ambulatory Visit: Payer: Self-pay | Admitting: Family Medicine

## 2014-04-12 NOTE — Telephone Encounter (Signed)
[facility]MAIL [facility] - [place], [place] is requesting re-fill on losartan-hydrochlorothiazide (HYZAAR) 100-25 MG per tablet

## 2014-04-14 MED ORDER — LOSARTAN POTASSIUM-HCTZ 100-25 MG PO TABS: 1.0000 | ORAL_TABLET | Freq: Every day | ORAL | Status: DC

## 2014-04-14 NOTE — Telephone Encounter (Signed)
Rx sent to pharmacy   

## 2014-06-14 ENCOUNTER — Ambulatory Visit (HOSPITAL_COMMUNITY)
Admission: RE | Admit: 2014-06-14 | Discharge: 2014-06-14 | Disposition: A | Discharge status: Home / Self Care | Payer: PPO | Source: Ambulatory Visit | Attending: Cardiology | Admitting: Cardiology

## 2014-06-14 DIAGNOSIS — I6522 Occlusion and stenosis of left carotid artery: Secondary | ICD-10-CM

## 2014-06-14 DIAGNOSIS — I6529 Occlusion and stenosis of unspecified carotid artery: Secondary | ICD-10-CM

## 2014-06-14 DIAGNOSIS — I779 Disorder of arteries and arterioles, unspecified: Secondary | ICD-10-CM

## 2014-06-14 NOTE — Progress Notes (Signed)
Carotid Duplex Completed. Stable left ICA 50-69% stenosis.  Dillon Young, BS, RDMS, RVT

## 2014-06-28 ENCOUNTER — Other Ambulatory Visit: Payer: Self-pay | Admitting: Cardiovascular Disease

## 2014-06-28 NOTE — Telephone Encounter (Signed)
Rx refill sent to patient pharmacy   

## 2014-07-02 ENCOUNTER — Telehealth: Payer: Self-pay | Admitting: Cardiovascular Disease

## 2014-07-02 MED ORDER — METOPROLOL SUCCINATE ER 25 MG PO TB24: 25.0000 mg | ORAL_TABLET | Freq: Every evening | ORAL | Status: DC

## 2014-07-02 NOTE — Telephone Encounter (Signed)
Rx refilled.

## 2014-07-02 NOTE — Telephone Encounter (Signed)
°  1. Which medications need to be refilled? Metropolol 25mg   2. Which pharmacy is medication to be sent to? [facility] -[phone]  3. Do they need a 30 day or 90 day supply? 90    4. Would they like a call back once the medication has been sent to the pharmacy? no

## 2014-07-05 ENCOUNTER — Encounter: Payer: Self-pay | Admitting: Family Medicine

## 2014-07-05 ENCOUNTER — Ambulatory Visit (INDEPENDENT_AMBULATORY_CARE_PROVIDER_SITE_OTHER): Payer: PPO | Admitting: Family Medicine

## 2014-07-05 ENCOUNTER — Telehealth: Payer: Self-pay | Admitting: Cardiovascular Disease

## 2014-07-05 VITALS — BP 140/57 | HR 54 | Temp 97.9°F | Ht 69.0 in | Wt 196.0 lb

## 2014-07-05 DIAGNOSIS — Z0000 Encounter for general adult medical examination without abnormal findings: Secondary | ICD-10-CM

## 2014-07-05 DIAGNOSIS — E119 Type 2 diabetes mellitus without complications: Secondary | ICD-10-CM

## 2014-07-05 DIAGNOSIS — F411 Generalized anxiety disorder: Secondary | ICD-10-CM

## 2014-07-05 LAB — POCT URINALYSIS DIPSTICK
Bilirubin, UA: NEGATIVE
Glucose, UA: NEGATIVE
KETONES UA: NEGATIVE
Nitrite, UA: NEGATIVE
PH UA: 6.5
Protein, UA: NEGATIVE
RBC UA: NEGATIVE
Spec Grav, UA: 1.01
UROBILINOGEN UA: 0.2

## 2014-07-05 LAB — CBC WITH DIFFERENTIAL/PLATELET
BASOS ABS: 0 10*3/uL (ref 0.0–0.1)
Basophils Relative: 0.6 % (ref 0.0–3.0)
Eosinophils Absolute: 0.1 10*3/uL (ref 0.0–0.7)
Eosinophils Relative: 2.2 % (ref 0.0–5.0)
HEMATOCRIT: 42.4 % (ref 39.0–52.0)
HEMOGLOBIN: 14.3 g/dL (ref 13.0–17.0)
LYMPHS ABS: 1.3 10*3/uL (ref 0.7–4.0)
Lymphocytes Relative: 20.3 % (ref 12.0–46.0)
MCHC: 33.7 g/dL (ref 30.0–36.0)
MCV: 86.4 fl (ref 78.0–100.0)
MONO ABS: 0.6 10*3/uL (ref 0.1–1.0)
MONOS PCT: 9.6 % (ref 3.0–12.0)
NEUTROS ABS: 4.4 10*3/uL (ref 1.4–7.7)
Neutrophils Relative %: 67.3 % (ref 43.0–77.0)
PLATELETS: 240 10*3/uL (ref 150.0–400.0)
RBC: 4.91 Mil/uL (ref 4.22–5.81)
RDW: 13.5 % (ref 11.5–15.5)
WBC: 6.5 10*3/uL (ref 4.0–10.5)

## 2014-07-05 LAB — LIPID PANEL
CHOLESTEROL: 131 mg/dL (ref 0–200)
HDL: 44.1 mg/dL (ref >39.00)
LDL Cholesterol: 58 mg/dL (ref 0–99)
NonHDL: 86.9
Total CHOL/HDL Ratio: 3
Triglycerides: 144 mg/dL (ref 0.0–149.0)
VLDL: 28.8 mg/dL (ref 0.0–40.0)

## 2014-07-05 LAB — HEPATIC FUNCTION PANEL
ALK PHOS: 55 U/L (ref 39–117)
ALT: 17 U/L (ref 0–53)
AST: 17 U/L (ref 0–37)
Albumin: 4.4 g/dL (ref 3.5–5.2)
BILIRUBIN DIRECT: 0.1 mg/dL (ref 0.0–0.3)
BILIRUBIN TOTAL: 0.5 mg/dL (ref 0.2–1.2)
Total Protein: 7.3 g/dL (ref 6.0–8.3)

## 2014-07-05 LAB — BASIC METABOLIC PANEL
BUN: 24 mg/dL — ABNORMAL HIGH (ref 6–23)
CALCIUM: 9.8 mg/dL (ref 8.4–10.5)
CO2: 30 meq/L (ref 19–32)
Chloride: 103 mEq/L (ref 96–112)
Creatinine, Ser: 1.19 mg/dL (ref 0.40–1.50)
GFR: 32.22 mL/min — ABNORMAL LOW (ref >60.00)
Glucose, Bld: 111 mg/dL — ABNORMAL HIGH (ref 70–99)
Potassium: 4.6 mEq/L (ref 3.5–5.1)
Sodium: 141 mEq/L (ref 135–145)

## 2014-07-05 LAB — TSH: TSH: 2.73 u[IU]/mL (ref 0.35–4.50)

## 2014-07-05 LAB — HEMOGLOBIN A1C: Hgb A1c MFr Bld: 6.6 % — ABNORMAL HIGH (ref 4.6–6.5)

## 2014-07-05 LAB — PSA: PSA: 0.01 ng/mL — ABNORMAL LOW (ref 0.10–4.00)

## 2014-07-05 MED ORDER — MELATONIN 5 MG PO CAPS: 1.0000 | ORAL_CAPSULE | Freq: Every day | ORAL | Status: DC

## 2014-07-05 MED ORDER — SERTRALINE HCL 50 MG PO TABS: 50.0000 mg | ORAL_TABLET | Freq: Every day | ORAL | Status: DC

## 2014-07-05 NOTE — Progress Notes (Signed)
   Subjective:    Patient ID: Dillon Young, male    DOB: Mar 30, 1933, 81 y.o.   MRN: [id number]  HPI 81 yr old male for a cpx. He feels well physically but asks if he could try something to help him relax. He feels anxious more than he used to and he finds himself getting angry at little things. He uses melatonin for sleep.    Review of Systems  Constitutional: Negative.   HENT: Negative.   Eyes: Negative.   Respiratory: Negative.   Cardiovascular: Negative.   Gastrointestinal: Negative.   Genitourinary: Negative.   Musculoskeletal: Negative.   Skin: Negative.   Neurological: Negative.   Psychiatric/Behavioral: Positive for sleep disturbance. Negative for suicidal ideas, hallucinations, behavioral problems, confusion, dysphoric mood, decreased concentration and agitation. The patient is nervous/anxious. The patient is not hyperactive.        Objective:   Physical Exam  Constitutional: He is oriented to person, place, and time. He appears well-developed and well-nourished. No distress.  HENT:  Head: Normocephalic and atraumatic.  Right Ear: External ear normal.  Left Ear: External ear normal.  Nose: Nose normal.  Mouth/Throat: Oropharynx is clear and moist. No oropharyngeal exudate.  Eyes: Conjunctivae and EOM are normal. Pupils are equal, round, and reactive to light. Right eye exhibits no discharge. Left eye exhibits no discharge. No scleral icterus.  Neck: Neck supple. No JVD present. No tracheal deviation present. No thyromegaly present.  Cardiovascular: Normal rate, regular rhythm, normal heart sounds and intact distal pulses.  Exam reveals no gallop and no friction rub.   No murmur heard. Pulmonary/Chest: Effort normal and breath sounds normal. No respiratory distress. He has no wheezes. He has no rales. He exhibits no tenderness.  Abdominal: Soft. Bowel sounds are normal. He exhibits no distension and no mass. There is no tenderness. There is no rebound and no guarding.    Genitourinary: Rectum normal and penis normal. Guaiac negative stool. No penile tenderness.  Prostate is surgically absent   Musculoskeletal: Normal range of motion. He exhibits no edema or tenderness.  Lymphadenopathy:    He has no cervical adenopathy.  Neurological: He is alert and oriented to person, place, and time. He has normal reflexes. No cranial nerve deficit. He exhibits normal muscle tone. Coordination normal.  Skin: Skin is warm and dry. No rash noted. He is not diaphoretic. No erythema. No pallor.  Psychiatric: He has a normal mood and affect. His behavior is normal. Judgment and thought content normal.          Assessment & Plan:  Well exam. Get fasting labs. Try Zoloft 50 mg a day.

## 2014-07-05 NOTE — Telephone Encounter (Signed)
Pt said he had blood work at his other doctor today. He would like for you to call him and he will explain what he needs you to do.

## 2014-07-05 NOTE — Progress Notes (Signed)
Pre visit review using our clinic review tool, if applicable. No additional management support is needed unless otherwise documented below in the visit note. 

## 2014-07-05 NOTE — Telephone Encounter (Signed)
Pt stated that he was to have blood work done next week per Dr. [name]. Pt stated that he had a physical today with PCP. PCP is going to send a copy of those results to Dr. [name]. Pt wanted to make sure that was ok and didn't need to have same blood work done next week. Advised pt that would be acceptable.

## 2014-07-08 ENCOUNTER — Telehealth: Payer: Self-pay | Admitting: Cardiovascular Disease

## 2014-07-08 ENCOUNTER — Telehealth: Payer: Self-pay | Admitting: Family Medicine

## 2014-07-08 NOTE — Telephone Encounter (Signed)
Left message to resend form of drug interaction

## 2014-07-08 NOTE — Telephone Encounter (Signed)
[facility] has sent a 2nd fax regarding duplicate therapy on patient.  Losartan-HCTZ (Dr. Sarajane Jews) and Lorsartan (Dr. [name]) are both being prescribed.  They need confirmation on which is the correct therapy for patient.

## 2014-07-08 NOTE — Telephone Encounter (Signed)
Wants to know if you received fax that was faxed on 07-06-14 concerning drug interaction?

## 2014-07-09 ENCOUNTER — Telehealth: Payer: Self-pay | Admitting: Cardiovascular Disease

## 2014-07-09 NOTE — Telephone Encounter (Signed)
Office closed. Will need to call back next week.  Pt is on both losartan and losartan HTCZ. This is noted on his chart by Dr. [name]. OK to continue both.

## 2014-07-09 NOTE — Telephone Encounter (Signed)
It appears to me that Dr. [name] intended for him to take both of these but I am not sure. Please ask Dr. [name] to address this and to do what he feels is best, and ask his office to get this straight with [facility]

## 2014-07-09 NOTE — Telephone Encounter (Signed)
I called [phone] and left a message with operator about this and also faxed request to [phone]. She will give the message to Dr. Kennon Holter assistant.

## 2014-07-09 NOTE — Telephone Encounter (Signed)
Sunday Spillers is calling because she keep getting a request [facility] and they want to know if the patient should be taking Losartan HCTZ and plain Losartan . Please call   Thanks

## 2014-07-12 ENCOUNTER — Telehealth: Payer: Self-pay | Admitting: Family Medicine

## 2014-07-12 ENCOUNTER — Other Ambulatory Visit: Payer: Self-pay | Admitting: Cardiovascular Disease

## 2014-07-12 MED ORDER — LOSARTAN POTASSIUM 50 MG PO TABS: 50.0000 mg | ORAL_TABLET | Freq: Every day | ORAL | Status: DC

## 2014-07-12 MED ORDER — LOSARTAN POTASSIUM-HCTZ 100-25 MG PO TABS: 1.0000 | ORAL_TABLET | Freq: Every day | ORAL | Status: DC

## 2014-07-12 NOTE — Telephone Encounter (Signed)
Rx(s) sent to pharmacy electronically.  

## 2014-07-12 NOTE — Telephone Encounter (Signed)
Pt has new pharm/ [facility]. Pt has 2 scripts , one for losartan-hydrochlorothiazide (HYZAAR) 100-25 MG per tablet  (by dr fry) And the other losartan (COZAAR) 50 MG tablet (by dr [name])  [facility] will not fill them until someone gets back w/ them,  [place] contact: Shawn  @ [phone]  I see now there is several notes and this was address by sylvia on 07/09/14  (Sorry)  Pt wants to know if dr fry can take over these meds. Pt states he has appt w dr berry tomorrow. Advised pt to take care of this in person

## 2014-07-12 NOTE — Telephone Encounter (Signed)
°  1. Which medications need to be refilled? Losartan HCTZ 125/25mg  and Losartan 50mg   2. Which pharmacy is medication to be sent to?[facility] [phone]  1. Do they need a 30 day or 90 day supply? Did not specify  4. Would they like a call back once the medication has been sent to the pharmacy? Yes  *PT STATES THAT A REFILL FAX HAS BEEN SENT OVER REGARDING THE 2 MEDS. THE DR HAS TO OK THE REFILL ON THESE DRUGS *

## 2014-07-12 NOTE — Telephone Encounter (Signed)
Forward for with review Dr [name] and Niger Williamson  RN SEE REFILL FORM IN THE DOCTOR'S BOX.

## 2014-07-13 ENCOUNTER — Encounter: Payer: Self-pay | Admitting: Cardiovascular Disease

## 2014-07-13 ENCOUNTER — Ambulatory Visit (INDEPENDENT_AMBULATORY_CARE_PROVIDER_SITE_OTHER): Payer: PPO | Admitting: Cardiovascular Disease

## 2014-07-13 ENCOUNTER — Telehealth: Payer: Self-pay | Admitting: Cardiovascular Disease

## 2014-07-13 VITALS — BP 150/56 | HR 76 | Ht 69.0 in | Wt 195.0 lb

## 2014-07-13 DIAGNOSIS — I2510 Atherosclerotic heart disease of native coronary artery without angina pectoris: Secondary | ICD-10-CM

## 2014-07-13 DIAGNOSIS — I779 Disorder of arteries and arterioles, unspecified: Secondary | ICD-10-CM

## 2014-07-13 DIAGNOSIS — Z79899 Other long term (current) drug therapy: Secondary | ICD-10-CM

## 2014-07-13 DIAGNOSIS — I739 Peripheral vascular disease, unspecified: Secondary | ICD-10-CM

## 2014-07-13 DIAGNOSIS — I10 Essential (primary) hypertension: Secondary | ICD-10-CM

## 2014-07-13 NOTE — Assessment & Plan Note (Signed)
History of hypertension with blood pressure measured at 162/58. He is on amlodipine, metoprolol and losartan/hydrochlorothiazide. I've asked him to keep a blood pressure log over the next month and he will see Erasmo Downer back after that for further evaluation. He may need to be started on some low-dose hydralazine in addition.

## 2014-07-13 NOTE — Telephone Encounter (Signed)
Stopping losartan 50 mg a day, continue losartan /thiazide

## 2014-07-13 NOTE — Progress Notes (Signed)
07/13/2014 MARKEVION LATTIN   1932/07/14  [id number]  Primary Physician Laurey Morale, MD Primary Cardiologist: [name] MD Renae Gloss   HPI:   Mr. Dillon Young is a delightful 81 year old married Caucasian male father of 2 children, grandfather to 2 grandchildren he was formally a patient of Dr. [name]. I last saw him in the office 04/09/14. 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 He has a history of coronary artery disease status post angioplasty in 1994 and 1995  Of his circumflex coronary artery. He has not required cardiac catheterization since stress Myoview was performed in 2011 that was low risk and nonischemic. He has a history of hypertension and hyperlipidemia on medication. He had right carotid endarterectomy performed by Dr. [name] Early June 2013 followed by duplex ultrasound. He denies chest pain or shortness of breath. His major complaint is of cramping in his thighs which may be related to his statin drug. He was given a statin holiday which really did not impact his symptoms and therefore statin was restarted and follow-up lipid profile performed 07/05/14 revealed a total cholesterol 131, LDL 58 and HDL of 44. Recent carotid Dopplers revealed his endarterectomy site. Widely patent with moderate left subclavian artery stenosis.   Current Outpatient Prescriptions  Medication Sig Dispense Refill  . amLODipine (NORVASC) 10 MG tablet Take 1 tablet (10 mg total) by mouth daily. 90 tablet 2  . aspirin EC 81 MG tablet Take 81 mg by mouth every evening.     . cholecalciferol (VITAMIN D) 1000 UNITS tablet Take 1,000 Units by mouth daily.      Marland Kitchen dicyclomine (BENTYL) 20 MG tablet Take 1 tablet (20 mg total) by mouth every 6 (six) hours as needed for spasms. 120 tablet 11  . fluticasone (FLONASE) 50 MCG/ACT nasal spray Place 2 sprays into the nose as needed.    Marland Kitchen losartan-hydrochlorothiazide (HYZAAR) 100-25 MG per tablet Take  1 tablet by mouth daily. 90 tablet 2  . Melatonin 5 MG CAPS Take 1 capsule (5 mg total) by mouth at bedtime. 1 capsule 0  . metoprolol succinate (TOPROL-XL) 25 MG 24 hr tablet Take 1 tablet (25 mg total) by mouth every evening. Take with or immediately following a meal. 90 tablet 2  . potassium chloride (K-DUR,KLOR-CON) 10 MEQ tablet TAKE 1 BY MOUTH DAILY 90 tablet 3  . sertraline (ZOLOFT) 50 MG tablet Take 1 tablet (50 mg total) by mouth daily. 30 tablet 3  . simvastatin (ZOCOR) 40 MG tablet Take 1 tablet (40 mg total) by mouth at bedtime. 90 tablet 3  . vitamin C (ASCORBIC ACID) 500 MG tablet Take 500 mg by mouth daily.     . Wheat Dextrin (BENEFIBER) POWD Take 1 packet by mouth every morning.     No current facility-administered medications for this visit.    Allergies  Allergen Reactions  . Lipitor [Atorvastatin] Other (See Comments)    myalgias    History   Social History  . Marital Status: Married    Spouse Name: N/A  . Number of Children: 2  . Years of Education: N/A   Occupational History  . [place] [employer]    Social History Main Topics  . Smoking status: Former Smoker -- 1.50 packs/day for 60 years    Types: Cigarettes    Quit date: 07/25/2008  . Smokeless tobacco: Never Used  . Alcohol Use: 0.0 oz/week    0 Standard drinks or equivalent  per week     Comment: Occ  . Drug Use: No  . Sexual Activity: Not on file   Other Topics Concern  . Not on file   Social History Narrative     Review of Systems: General: negative for chills, fever, night sweats or weight changes.  Cardiovascular: negative for chest pain, dyspnea on exertion, edema, orthopnea, palpitations, paroxysmal nocturnal dyspnea or shortness of breath Dermatological: negative for rash Respiratory: negative for cough or wheezing Urologic: negative for hematuria Abdominal: negative for nausea, vomiting, diarrhea, bright red blood per rectum, melena, or hematemesis Neurologic: negative for  visual changes, syncope, or dizziness All other systems reviewed and are otherwise negative except as noted above.    Blood pressure 162/58, pulse 76, height 5\' 9"  (1.753 m), weight 195 lb (88.451 kg).  General appearance: alert and no distress Neck: no adenopathy, no JVD, supple, symmetrical, trachea midline, thyroid not enlarged, symmetric, no tenderness/mass/nodules and soft left carotid and subclavian bruit Lungs: clear to auscultation bilaterally Heart: regular rate and rhythm, S1, S2 normal, no murmur, click, rub or gallop Extremities: extremities normal, atraumatic, no cyanosis or edema  EKG normal sinus rhythm at 76 with bigeminal PVCs.   ASSESSMENT AND PLAN:   HYPERLIPIDEMIA History of hyperlipidemia on simvastatin 40 mg a day with recent lipid profile performed 07/05/14 revealing a total cholesterol of 131, LDL 58 HDL of 44   Essential hypertension History of hypertension with blood pressure measured at 162/58. He is on amlodipine, metoprolol and losartan/hydrochlorothiazide. I've asked him to keep a blood pressure log over the next month and he will see Erasmo Downer back after that for further evaluation. He may need to be started on some low-dose hydralazine in addition.   Coronary atherosclerosis History of CAD status post angioplasty in 1994 and 1995 of his circumflex coronary artery. He has never had a repeat cath since that time. His last Myoview stress test performed 06/10/09 was nonischemic. He denies chest pain or shortness of breath.   Cerebrovascular disease History of carotid artery disease status post right carotid endarterectomy performed by Dr. [name] Early June 2013 followed by duplex ultrasound. His last Dopplers performed in our office 06/14/14 revealed a widely patent right carotid endarterectomy site, moderate left internal carotid artery stenosis and what appears to be high-grade left subclavian artery stenosis with antegrade vertebral flow. He really denies  claudication symptoms.       [name] MD FACP,FACC,FAHA, [facility] 07/13/2014 2:36 PM

## 2014-07-13 NOTE — Telephone Encounter (Signed)
Called for patient, got this issue resolved.

## 2014-07-13 NOTE — Telephone Encounter (Addendum)
Pt saw dr berry today. Dr [name] would like pt to stop all meds from dr fry.

## 2014-07-13 NOTE — Telephone Encounter (Signed)
Pt received call from pharmacy saying there is an interaction between his Simvastatin and his Amlodipine.They have been waiting to hear from Dr [name] on what he wants to do.Please call [facility] [phone]. Please call today,this have been going on for 3 weeks.

## 2014-07-13 NOTE — Assessment & Plan Note (Signed)
History of hyperlipidemia on simvastatin 40 mg a day with recent lipid profile performed 07/05/14 revealing a total cholesterol of 131, LDL 58 HDL of 44

## 2014-07-13 NOTE — Assessment & Plan Note (Signed)
History of carotid artery disease status post right carotid endarterectomy performed by Dr. [name] Early June 2013 followed by duplex ultrasound. His last Dopplers performed in our office 06/14/14 revealed a widely patent right carotid endarterectomy site, moderate left internal carotid artery stenosis and what appears to be high-grade left subclavian artery stenosis with antegrade vertebral flow. He really denies claudication symptoms.

## 2014-07-13 NOTE — Assessment & Plan Note (Signed)
History of CAD status post angioplasty in 1994 and 1995 of his circumflex coronary artery. He has never had a repeat cath since that time. His last Myoview stress test performed 06/10/09 was nonischemic. He denies chest pain or shortness of breath.

## 2014-07-13 NOTE — Patient Instructions (Signed)
Carotid Doppler- This test is an ultrasound of the carotid arteries in your neck. It looks at blood flow through these arteries that supply the brain with blood. Allow one hour for this exam. There are no restrictions or special instructions.  Upper Extremity Doppler- this is an ultrasound of your upper extremities (arms). It looks at the blood flow through the arteries that take blood flow down your arms. Allow one hour for this exam. There are no restrictions or special instructions.   Renal Artery Doppler- During this test, an ultrasound is used to evaluate blood flow to the kidneys. Allow one hour for this exam. Do not eat after midnight the day before and avoid carbonated beverages. Take your medications as you usually do.  We request that you follow-up in: 6 months with an extender and in 12 months with Dr [name].  You will receive a reminder letter in the mail two months in advance. If you don't receive a letter, please call our office to schedule the follow-up appointment.  Please make a Blood Pressure appointment with Erasmo Downer, Pharm,D in 1 month.   Please have blood work completed in 3 weeks, prior to seeing Erasmo Downer.

## 2014-07-13 NOTE — Telephone Encounter (Signed)
Form faxed. Pt notified.

## 2014-07-15 NOTE — Telephone Encounter (Signed)
noted 

## 2014-07-23 ENCOUNTER — Ambulatory Visit (HOSPITAL_COMMUNITY)
Admission: RE | Admit: 2014-07-23 | Discharge: 2014-07-23 | Disposition: A | Discharge status: Home / Self Care | Payer: PPO | Source: Ambulatory Visit | Attending: Cardiology | Admitting: Cardiology

## 2014-07-23 DIAGNOSIS — N281 Cyst of kidney, acquired: Secondary | ICD-10-CM | POA: Insufficient documentation

## 2014-07-23 DIAGNOSIS — I10 Essential (primary) hypertension: Secondary | ICD-10-CM | POA: Insufficient documentation

## 2014-07-23 DIAGNOSIS — I739 Peripheral vascular disease, unspecified: Secondary | ICD-10-CM | POA: Diagnosis not present

## 2014-07-23 NOTE — Progress Notes (Signed)
Renal Artery Duplex Completed. °Brianna L Mazza,RVT °

## 2014-08-13 ENCOUNTER — Ambulatory Visit (INDEPENDENT_AMBULATORY_CARE_PROVIDER_SITE_OTHER): Payer: PPO | Admitting: Pharmacist Clinician (PhC)/ Clinical Pharmacy Specialist

## 2014-08-13 VITALS — BP 160/52 | HR 28 | Ht 69.0 in | Wt 193.4 lb

## 2014-08-13 DIAGNOSIS — I498 Other specified cardiac arrhythmias: Secondary | ICD-10-CM

## 2014-08-13 DIAGNOSIS — I499 Cardiac arrhythmia, unspecified: Secondary | ICD-10-CM | POA: Diagnosis not present

## 2014-08-13 DIAGNOSIS — I10 Essential (primary) hypertension: Secondary | ICD-10-CM

## 2014-08-13 NOTE — Patient Instructions (Signed)
/  Return for a a follow up appointment in 6wks  Your blood pressure today is 160/52  Check your blood pressure at home daily and keep record of the readings.  Take your BP meds as follows: continue all meds  Bring all of your meds, your BP cuff and your record of home blood pressures to your next appointment.  Exercise as you're able, try to walk approximately 30 minutes per day.  Keep salt intake to a minimum, especially watch canned and prepared boxed foods.  Eat more fresh fruits and vegetables and fewer canned items.  Avoid eating in fast food restaurants.    HOW TO TAKE YOUR BLOOD PRESSURE: . Rest 5 minutes before taking your blood pressure. .  Don't smoke or drink caffeinated beverages for at least 30 minutes before. . Take your blood pressure before (not after) you eat. . Sit comfortably with your back supported and both feet on the floor (don't cross your legs). . Elevate your arm to heart level on a table or a desk. . Use the proper sized cuff. It should fit smoothly and snugly around your bare upper arm. There should be enough room to slip a fingertip under the cuff. The bottom edge of the cuff should be 1 inch above the crease of the elbow. . Ideally, take 3 measurements at one sitting and record the average.

## 2014-08-15 NOTE — Assessment & Plan Note (Addendum)
Pt in office BP today elevated at 160/52.  Home cuff read systolic within 5 points.  Based on that home BP readings, all <150/90 are WNL.  Will have him continue with current medications and advised to cut back on fast food due to high sodium content.  Of note pulse was difficult to feel, used pulse-ox.  Was found to be at 50.  Pt feeling fine.  EKG was performed and reviewed with Dr. Sallyanne Kuster before patient left office.  I asked him to continue with all of his current medications, continue with home BP monitoring several days per week, and I will see him back in 6 weeks

## 2014-08-15 NOTE — Progress Notes (Signed)
08/15/2014 Dillon Young 1933/01/30 [id number]   HPI:  Dillon Young is a 81 y.o. male patient of Dr Dillon Young, with a PMH below who presents today for hypertension clinic evaluation.  Cardiac Hx: CAD with angioplasty in 2831,5176, borderline DM, HTN most of adult life  Family Hx: father HTN, died of aneurysm at 29  Social Hx: occasional alcohol, quit smoking 6 years ago, coffee mostly decaf  Diet:  Eats out regularly, fast food; when eats at home does not use salt;    Exercise: walks 3-+ minutes at least 3 days per week, still works part time  Home BP readings: ReliOn meter about 73-71 years old; home readings 130-150/40-60  Current antihypertensive medications: losartan hctz 100/25 qam; metoprolol succ 25mg  qam, amlodipine 10 mg qpm   Current Outpatient Prescriptions  Medication Sig Dispense Refill  . amLODipine (NORVASC) 10 MG tablet Take 1 tablet (10 mg total) by mouth daily. 90 tablet 2  . aspirin EC 81 MG tablet Take 81 mg by mouth every evening.     . cholecalciferol (VITAMIN D) 1000 UNITS tablet Take 1,000 Units by mouth daily.      Marland Kitchen dicyclomine (BENTYL) 20 MG tablet Take 1 tablet (20 mg total) by mouth every 6 (six) hours as needed for spasms. 120 tablet 11  . fluticasone (FLONASE) 50 MCG/ACT nasal spray Place 2 sprays into the nose as needed.    Marland Kitchen losartan-hydrochlorothiazide (HYZAAR) 100-25 MG per tablet Take 1 tablet by mouth daily. 90 tablet 2  . Melatonin 5 MG CAPS Take 1 capsule (5 mg total) by mouth at bedtime. 1 capsule 0  . metoprolol succinate (TOPROL-XL) 25 MG 24 hr tablet Take 1 tablet (25 mg total) by mouth every evening. Take with or immediately following a meal. 90 tablet 2  . potassium chloride (K-DUR,KLOR-CON) 10 MEQ tablet TAKE 1 BY MOUTH DAILY 90 tablet 3  . sertraline (ZOLOFT) 50 MG tablet Take 1 tablet (50 mg total) by mouth daily. 30 tablet 3  . simvastatin (ZOCOR) 40 MG tablet Take 1 tablet (40 mg total) by mouth at bedtime. 90 tablet 3  .  vitamin C (ASCORBIC ACID) 500 MG tablet Take 500 mg by mouth daily.     . Wheat Dextrin (BENEFIBER) POWD Take 1 packet by mouth every morning.     No current facility-administered medications for this visit.    Allergies  Allergen Reactions  . Lipitor [Atorvastatin] Other (See Comments)    myalgias    Past Medical History  Diagnosis Date  . Allergic rhinitis   . Smoker   . HTN (hypertension)   . CAD (coronary artery disease)     sees Dr. [name]   . CVA (cerebral infarction)   . Hyperlipidemia     echo 10-25-2010 mild abnormalities  . IBS (irritable bowel syndrome)   . Diverticulitis   . Hemorrhoids   . BPH (benign prostatic hyperplasia)   . Prostate cancer 2008    seed implantation  . Low back pain   . Adenomatous colon polyp   . Sleep apnea     last study was aborted.  Has never worn a CPAP.    . Diabetes mellitus     "Borderline"  . Chronic kidney disease     Sones. Prostate cancer  . GERD (gastroesophageal reflux disease)     history  . Carotid artery disease     Blood pressure 160/52, pulse 28, height 5\' 9"  (1.753 m), weight 193 lb 6.4  oz (87.726 kg).    Tommy Medal PharmD CPP [facility]

## 2014-08-17 ENCOUNTER — Encounter: Payer: Self-pay | Admitting: Pharmacist Clinician (PhC)/ Clinical Pharmacy Specialist

## 2014-08-18 NOTE — Addendum Note (Signed)
Addended by: Chauncy Lean. on: 08/18/2014 09:48 AM   Modules accepted: Orders

## 2014-09-15 ENCOUNTER — Encounter: Payer: Self-pay | Admitting: Pharmacist Clinician (PhC)/ Clinical Pharmacy Specialist

## 2014-09-15 ENCOUNTER — Telehealth: Payer: Self-pay | Admitting: Pharmacist Clinician (PhC)/ Clinical Pharmacy Specialist

## 2014-09-15 ENCOUNTER — Ambulatory Visit (INDEPENDENT_AMBULATORY_CARE_PROVIDER_SITE_OTHER): Payer: PPO | Admitting: Pharmacist Clinician (PhC)/ Clinical Pharmacy Specialist

## 2014-09-15 VITALS — BP 158/62 | HR 56 | Ht 69.0 in | Wt 195.3 lb

## 2014-09-15 DIAGNOSIS — I10 Essential (primary) hypertension: Secondary | ICD-10-CM

## 2014-09-15 MED ORDER — VALSARTAN-HYDROCHLOROTHIAZIDE 320-25 MG PO TABS: 1.0000 | ORAL_TABLET | Freq: Every day | ORAL | Status: DC

## 2014-09-15 MED ORDER — IRBESARTAN-HYDROCHLOROTHIAZIDE 300-12.5 MG PO TABS: 1.0000 | ORAL_TABLET | Freq: Every day | ORAL | Status: DC

## 2014-09-15 NOTE — Progress Notes (Signed)
09/15/2014 Dillon Young 09-15-1932 [id number]   HPI:  Dillon Young is a 81 y.o. male patient of Dr [name], with a PMH below who presents today for hypertension clinic evaluation.  Cardiac Hx: CAD with angioplasty in 6195,0932, borderline DM, HTN most of adult life  Family Hx: father HTN, died of aneurysm at 25  Social Hx: occasional alcohol, quit smoking 6 years ago, coffee mostly decaf  Diet:  Eats out regularly, fast food; when eats at home does not use salt;    Exercise: walks 30+ minutes at least 3 days per week, still works part time  Home BP readings: bought new Omron cuff, still has problems with error readings, most likely because of bigemeny.  Home readings (26) show only 2 >671 systolic.  Mostly 140s.  Diastolic all WNL.  HR varied.  Current antihypertensive medications: losartan hctz 100/25 qam; metoprolol succ 25mg  qam, amlodipine 10 mg qpm   Current Outpatient Prescriptions  Medication Sig Dispense Refill  . amLODipine (NORVASC) 10 MG tablet Take 1 tablet (10 mg total) by mouth daily. 90 tablet 2  . aspirin EC 81 MG tablet Take 81 mg by mouth every evening.     . cholecalciferol (VITAMIN D) 1000 UNITS tablet Take 1,000 Units by mouth daily.      Marland Kitchen dicyclomine (BENTYL) 20 MG tablet Take 1 tablet (20 mg total) by mouth every 6 (six) hours as needed for spasms. 120 tablet 11  . fluticasone (FLONASE) 50 MCG/ACT nasal spray Place 2 sprays into the nose as needed.    . irbesartan-hydrochlorothiazide (AVALIDE) 300-12.5 MG per tablet Take 1 tablet by mouth daily. 30 tablet 5  . Melatonin 5 MG CAPS Take 1 capsule (5 mg total) by mouth at bedtime. 1 capsule 0  . metoprolol succinate (TOPROL-XL) 25 MG 24 hr tablet Take 1 tablet (25 mg total) by mouth every evening. Take with or immediately following a meal. 90 tablet 2  . potassium chloride (K-DUR,KLOR-CON) 10 MEQ tablet TAKE 1 BY MOUTH DAILY 90 tablet 3  . sertraline (ZOLOFT) 50 MG tablet Take 1 tablet (50 mg total)  by mouth daily. 30 tablet 3  . simvastatin (ZOCOR) 40 MG tablet Take 1 tablet (40 mg total) by mouth at bedtime. 90 tablet 3  . vitamin C (ASCORBIC ACID) 500 MG tablet Take 500 mg by mouth daily.     . Wheat Dextrin (BENEFIBER) POWD Take 1 packet by mouth every morning.     No current facility-administered medications for this visit.    Allergies  Allergen Reactions  . Lipitor [Atorvastatin] Other (See Comments)    myalgias    Past Medical History  Diagnosis Date  . Allergic rhinitis   . Smoker   . HTN (hypertension)   . CAD (coronary artery disease)     sees Dr. [name]   . CVA (cerebral infarction)   . Hyperlipidemia     echo 10-25-2010 mild abnormalities  . IBS (irritable bowel syndrome)   . Diverticulitis   . Hemorrhoids   . BPH (benign prostatic hyperplasia)   . Prostate cancer 2008    seed implantation  . Low back pain   . Adenomatous colon polyp   . Sleep apnea     last study was aborted.  Has never worn a CPAP.    . Diabetes mellitus     "Borderline"  . Chronic kidney disease     Sones. Prostate cancer  . GERD (gastroesophageal reflux disease)  history  . Carotid artery disease     Blood pressure 158/62, pulse 56, height 5\' 9"  (1.753 m), weight 195 lb 4.8 oz (88.587 kg).    Tommy Medal PharmD CPP [facility]

## 2014-09-15 NOTE — Patient Instructions (Signed)
Call in 4-5 weeks and let me know what your home BP readings are  Your blood pressure today is 158/62  Check your blood pressure at home daily and keep record of the readings.  Take your BP meds as follows: when you run out of losartan hctz, switch to irbesartan hctz  Bring all of your meds, your BP cuff and your record of home blood pressures to your next appointment.  Exercise as you're able, try to walk approximately 30 minutes per day.  Keep salt intake to a minimum, especially watch canned and prepared boxed foods.  Eat more fresh fruits and vegetables and fewer canned items.  Avoid eating in fast food restaurants.    HOW TO TAKE YOUR BLOOD PRESSURE: . Rest 5 minutes before taking your blood pressure. .  Don't smoke or drink caffeinated beverages for at least 30 minutes before. . Take your blood pressure before (not after) you eat. . Sit comfortably with your back supported and both feet on the floor (don't cross your legs). . Elevate your arm to heart level on a table or a desk. . Use the proper sized cuff. It should fit smoothly and snugly around your bare upper arm. There should be enough room to slip a fingertip under the cuff. The bottom edge of the cuff should be 1 inch above the crease of the elbow. . Ideally, take 3 measurements at one sitting and record the average.

## 2014-09-15 NOTE — Assessment & Plan Note (Signed)
His BP is slightly elevated in the office today.  However his home cuff was accurate to within 5 points.  Suggested to him that we switch his losartan hctz 100/25 to another ARB to see if we can get better results, rather than adding new medication at this time.  Pt willing to switch, so will start on irbesartan hctz 300/12.5 mg, once his losartan is gone (about 10-14 more days).  Asked that he continue monitoring home BP and to call in about 4-5 weeks (once he has been on irbesartan for 2-3 weeks) to let us know what his pressure is.  My hope is that we can decrease the home systolic by about 5-49 more points.

## 2014-09-15 NOTE — Telephone Encounter (Signed)
Pt LMOM stating copay fairly high for irbesartan hctz.  We had discussed trying either irbesartan or valsartan.  Would like [place] to call in valsartan to see if any cheaper

## 2014-09-17 ENCOUNTER — Telehealth: Payer: Self-pay | Admitting: Pharmacist Clinician (PhC)/ Clinical Pharmacy Specialist

## 2014-09-17 MED ORDER — IRBESARTAN-HYDROCHLOROTHIAZIDE 300-12.5 MG PO TABS: 1.0000 | ORAL_TABLET | Freq: Every day | ORAL | Status: DC

## 2014-09-17 NOTE — Telephone Encounter (Signed)
Called patient about cost of ARB - he states both irbesartan hct and valsartan hct will cost the same amount, so he filled the irbesartan.  Will be able to get cheaper thru mail order once he knows it's working for him.  Also explained results of renal dopplers and EKG and why the results don't appear on MyChart.  Pt voiced understanding.

## 2014-09-24 ENCOUNTER — Encounter: Payer: PPO | Admitting: Pharmacist Clinician (PhC)/ Clinical Pharmacy Specialist

## 2014-09-30 ENCOUNTER — Other Ambulatory Visit: Payer: Self-pay | Admitting: Cardiovascular Disease

## 2014-09-30 MED ORDER — AMLODIPINE BESYLATE 10 MG PO TABS: 10.0000 mg | ORAL_TABLET | Freq: Every day | ORAL | Status: DC

## 2014-09-30 NOTE — Telephone Encounter (Signed)
Rx(s) sent to pharmacy electronically.  

## 2014-09-30 NOTE — Telephone Encounter (Signed)
°  1. Which medications need to be refilled? Amlodipine  2. Which pharmacy is medication to be sent to? [facility]-[phone]  3. Do they need a 30 day or 90 day supply? 90 and refills  4. Would they like a call back once the medication has been sent to the pharmacy? no

## 2014-10-22 ENCOUNTER — Telehealth: Payer: Self-pay | Admitting: Pharmacist Clinician (PhC)/ Clinical Pharmacy Specialist

## 2014-10-22 MED ORDER — IRBESARTAN-HYDROCHLOROTHIAZIDE 300-12.5 MG PO TABS: 1.0000 | ORAL_TABLET | Freq: Every day | ORAL | Status: DC

## 2014-10-22 NOTE — Telephone Encounter (Signed)
Pt called with BP information, LMOM  Returned call, spoke with wife, states his BP running mostly 401'U systolic, has occasional > 140 but not often.  She states diastolic today was 40.  HR 38.  Pt has hx of bigeminy on EKG.    No concerns of dizziness, lightheadedness.  Will send irbesartan hctz 300/12.5 rx to mail order pharmacy.  Pt spouse knows to call if any concerns.

## 2014-11-01 ENCOUNTER — Ambulatory Visit (INDEPENDENT_AMBULATORY_CARE_PROVIDER_SITE_OTHER): Payer: PPO | Admitting: Family Medicine

## 2014-11-01 ENCOUNTER — Telehealth: Payer: Self-pay | Admitting: Pharmacist Clinician (PhC)/ Clinical Pharmacy Specialist

## 2014-11-01 ENCOUNTER — Encounter: Payer: Self-pay | Admitting: Family Medicine

## 2014-11-01 VITALS — BP 140/62 | HR 71 | Temp 98.1°F | Wt 196.0 lb

## 2014-11-01 DIAGNOSIS — J069 Acute upper respiratory infection, unspecified: Secondary | ICD-10-CM | POA: Diagnosis not present

## 2014-11-01 MED ORDER — IRBESARTAN-HYDROCHLOROTHIAZIDE 300-12.5 MG PO TABS: 1.0000 | ORAL_TABLET | Freq: Every day | ORAL | Status: DC

## 2014-11-01 MED ORDER — METHYLPREDNISOLONE ACETATE 80 MG/ML IJ SUSP
80.0000 mg | Freq: Once | INTRAMUSCULAR | Status: AC
  Administered 2014-11-01: 80 mg via INTRAMUSCULAR

## 2014-11-01 NOTE — Telephone Encounter (Signed)
Pt upset, states he ran low of medication, had called [facility] to find out when rx would come.  Pt reports when he spoke to rep at [facility], they explained they never got Rx from [place]. I reviewed encounter. It was initially done as "No Print" and so I resubmitted as normal electronic refill. E-req was confirmed by pharmacy. I also did call pharmacy to leave refill information in case the e-req went unreceived.  Pt states he did get meds at local pharmacy for a 30 day supply.

## 2014-11-01 NOTE — Progress Notes (Signed)
Pre visit review using our clinic review tool, if applicable. No additional management support is needed unless otherwise documented below in the visit note. 

## 2014-11-01 NOTE — Progress Notes (Signed)
   Subjective:    Patient ID: Dillon Young, male    DOB: 08-06-1932, 81 y.o.   MRN: [id number]  HPI Here for 4 days of stuffy head, PND, chest tightness and coughing up clear sputum. No fever. On Flonase and Claritin.    Review of Systems  Constitutional: Negative.   HENT: Positive for congestion and postnasal drip. Negative for sinus pressure.   Eyes: Negative.   Respiratory: Positive for cough and chest tightness. Negative for shortness of breath and wheezing.        Objective:   Physical Exam  Constitutional: He appears well-developed and well-nourished.  HENT:  Right Ear: External ear normal.  Left Ear: External ear normal.  Nose: Nose normal.  Mouth/Throat: Oropharynx is clear and moist.  Eyes: Conjunctivae are normal.  Pulmonary/Chest: Effort normal and breath sounds normal.  Lymphadenopathy:    He has no cervical adenopathy.          Assessment & Plan:  Viral uri. Given a steroid shot for symptomatic relief.

## 2014-11-01 NOTE — Addendum Note (Signed)
Addended by: Santiago Bumpers on: 11/01/2014 10:38 AM   Modules accepted: Orders

## 2014-11-01 NOTE — Telephone Encounter (Signed)
Pt called in wanting to speak with Erasmo Downer or a nurse about his Irbesartan/HCTZ medication. Please call  Thanks

## 2014-11-10 ENCOUNTER — Telehealth: Payer: Self-pay | Admitting: Family Medicine

## 2014-11-10 NOTE — Telephone Encounter (Signed)
Pt was seen on 11-01-14 and was given cough med. Pt states cough med is not working and would like prednisone and different med call into [facility] [address]

## 2014-11-12 ENCOUNTER — Ambulatory Visit: Payer: PPO | Admitting: Family Medicine

## 2014-11-12 NOTE — Telephone Encounter (Signed)
He already as an OV today

## 2014-11-22 ENCOUNTER — Other Ambulatory Visit: Payer: Self-pay

## 2015-01-13 ENCOUNTER — Telehealth: Payer: Self-pay | Admitting: Pharmacist Clinician (PhC)/ Clinical Pharmacy Specialist

## 2015-01-13 MED ORDER — AMLODIPINE BESYLATE 10 MG PO TABS: 5.0000 mg | ORAL_TABLET | Freq: Every day | ORAL | Status: DC

## 2015-01-13 NOTE — Telephone Encounter (Signed)
Returned call, pt states in past 10 days has had low diastolic readings, down to 48.  He skipped 2 or 3  doses of irbesartan hctz 300/12.5 mg since then.  Systolic readings are stable from 112-134.  Advised patient to continue with irbesartan hctz daily but cut amlodipine from 10 to 5 mg daily.  He will call in 1 week with updated readings.

## 2015-01-13 NOTE — Telephone Encounter (Signed)
Pt concerned because BP readings are low, especially diastolic.

## 2015-01-20 ENCOUNTER — Telehealth: Payer: Self-pay | Admitting: Pharmacist Clinician (PhC)/ Clinical Pharmacy Specialist

## 2015-01-20 NOTE — Telephone Encounter (Signed)
Patient was told to give you a call today.

## 2015-01-21 MED ORDER — AMLODIPINE BESYLATE 5 MG PO TABS: 5.0000 mg | ORAL_TABLET | Freq: Every day | ORAL | Status: DC

## 2015-01-21 NOTE — Telephone Encounter (Signed)
Patient calling in BP readings: over the past week all readings have been between 694-854 systolic and 62-70 diastolic.  Patient reports no syncope, dizziness or other concerns.  Advised that he continue with amlodipine 5 mg daily and I will call prescription in to his mail order pharmacy.  He understands to call if BP >150 or <110 on multiple occasions

## 2015-01-24 ENCOUNTER — Telehealth: Payer: Self-pay | Admitting: Internal Medicine

## 2015-01-24 DIAGNOSIS — K5730 Diverticulosis of large intestine without perforation or abscess without bleeding: Secondary | ICD-10-CM

## 2015-01-24 MED ORDER — DICYCLOMINE HCL 20 MG PO TABS: 20.0000 mg | ORAL_TABLET | Freq: Four times a day (QID) | ORAL | Status: DC | PRN

## 2015-01-24 NOTE — Telephone Encounter (Signed)
Spoke with patient and confirmed pharmacy and patient requested that we only send in #30 at a time, he uses as needed and his supply had outdated.

## 2015-01-24 NOTE — Telephone Encounter (Signed)
Please advise Sir? 

## 2015-01-24 NOTE — Telephone Encounter (Signed)
OK to refill x 6 

## 2015-01-26 ENCOUNTER — Telehealth: Payer: Self-pay | Admitting: Internal Medicine

## 2015-01-26 NOTE — Telephone Encounter (Signed)
Faxed form back to [facility] at fax # [phone] to do prior authorization.  Will await response.

## 2015-01-26 NOTE — Telephone Encounter (Signed)
Caller name: Renee Relation to pt: Insurance - [employer] Call back number: [phone]  Ref # [id number]   Reason for call:  Wanting to do PA for dicyclomine (BENTYL) 20 MG

## 2015-01-28 NOTE — Telephone Encounter (Signed)
Dicyclomine has been approved from 01/27/2015-05/28/2015.  [facility] pharmacy informed.

## 2015-05-25 ENCOUNTER — Other Ambulatory Visit: Payer: Self-pay | Admitting: Cardiovascular Disease

## 2015-05-25 MED ORDER — IRBESARTAN-HYDROCHLOROTHIAZIDE 300-12.5 MG PO TABS: 1.0000 | ORAL_TABLET | Freq: Every day | ORAL | Status: DC

## 2015-05-25 NOTE — Telephone Encounter (Signed)
Rx(s) sent to pharmacy electronically.  

## 2015-05-25 NOTE — Telephone Encounter (Signed)
°*  STAT* If patient is at the pharmacy, call can be transferred to refill team.   1. Which medications need to be refilled? (please list name of each medication and dose if known) Irbesartan- HCTZ 300mg -12.5mg   2. Which pharmacy/location (including street and city if local pharmacy) is medication to be sent to?[facility]   3. Do they need a 30 day or 90 day supply? [place]

## 2015-05-31 LAB — HM DIABETES EYE EXAM

## 2015-06-02 DIAGNOSIS — H9313 Tinnitus, bilateral: Secondary | ICD-10-CM | POA: Diagnosis not present

## 2015-06-02 DIAGNOSIS — H903 Sensorineural hearing loss, bilateral: Secondary | ICD-10-CM | POA: Diagnosis not present

## 2015-06-02 DIAGNOSIS — H60331 Swimmer's ear, right ear: Secondary | ICD-10-CM | POA: Diagnosis not present

## 2015-06-02 DIAGNOSIS — H9113 Presbycusis, bilateral: Secondary | ICD-10-CM | POA: Diagnosis not present

## 2015-06-09 ENCOUNTER — Other Ambulatory Visit: Payer: Self-pay | Admitting: Cardiovascular Disease

## 2015-06-09 DIAGNOSIS — I6523 Occlusion and stenosis of bilateral carotid arteries: Secondary | ICD-10-CM

## 2015-06-13 ENCOUNTER — Telehealth: Payer: Self-pay | Admitting: Internal Medicine

## 2015-06-13 NOTE — Telephone Encounter (Signed)
Patient with intermittent abdominal pain.  He is not sure if this is from "my kidneys or my intestines".  He reports pain comes and goes.  He asked if he could leave a urine specimen. He is advised that he will need to be evaluated in the office prior to any testing to be done.  He is advised that he should see his primary care if he thinks his pain is related to his kidneys.  He is given an appt for 06/17/15 with Alonza Bogus, PA to evaluate his lower abdominal pain

## 2015-06-14 DIAGNOSIS — R1032 Left lower quadrant pain: Secondary | ICD-10-CM | POA: Diagnosis not present

## 2015-06-14 DIAGNOSIS — N200 Calculus of kidney: Secondary | ICD-10-CM | POA: Diagnosis not present

## 2015-06-14 DIAGNOSIS — Z0000 Encounter for general adult medical examination without abnormal findings: Secondary | ICD-10-CM | POA: Diagnosis not present

## 2015-06-14 DIAGNOSIS — R110 Nausea: Secondary | ICD-10-CM | POA: Diagnosis not present

## 2015-06-15 ENCOUNTER — Ambulatory Visit (HOSPITAL_COMMUNITY)
Admission: RE | Admit: 2015-06-15 | Discharge: 2015-06-15 | Disposition: A | Discharge status: Home / Self Care | Payer: PPO | Source: Ambulatory Visit | Attending: Urology | Admitting: Urology

## 2015-06-15 DIAGNOSIS — I2510 Atherosclerotic heart disease of native coronary artery without angina pectoris: Secondary | ICD-10-CM | POA: Diagnosis not present

## 2015-06-15 DIAGNOSIS — I10 Essential (primary) hypertension: Secondary | ICD-10-CM | POA: Insufficient documentation

## 2015-06-15 DIAGNOSIS — E785 Hyperlipidemia, unspecified: Secondary | ICD-10-CM | POA: Insufficient documentation

## 2015-06-15 DIAGNOSIS — E119 Type 2 diabetes mellitus without complications: Secondary | ICD-10-CM | POA: Insufficient documentation

## 2015-06-15 DIAGNOSIS — I6523 Occlusion and stenosis of bilateral carotid arteries: Secondary | ICD-10-CM

## 2015-06-15 DIAGNOSIS — Z87891 Personal history of nicotine dependence: Secondary | ICD-10-CM | POA: Insufficient documentation

## 2015-06-17 ENCOUNTER — Ambulatory Visit: Payer: PPO | Admitting: Gastroenterology

## 2015-06-20 ENCOUNTER — Other Ambulatory Visit: Payer: Self-pay

## 2015-06-20 DIAGNOSIS — I6522 Occlusion and stenosis of left carotid artery: Secondary | ICD-10-CM

## 2015-06-24 ENCOUNTER — Other Ambulatory Visit: Payer: Self-pay | Admitting: *Deleted

## 2015-06-24 MED ORDER — METOPROLOL SUCCINATE ER 25 MG PO TB24: 25.0000 mg | ORAL_TABLET | Freq: Every evening | ORAL | Status: DC

## 2015-06-24 NOTE — Telephone Encounter (Signed)
Rx request sent to pharmacy.  

## 2015-06-29 DIAGNOSIS — H01004 Unspecified blepharitis left upper eyelid: Secondary | ICD-10-CM | POA: Diagnosis not present

## 2015-06-29 DIAGNOSIS — H01001 Unspecified blepharitis right upper eyelid: Secondary | ICD-10-CM | POA: Diagnosis not present

## 2015-06-29 DIAGNOSIS — H26491 Other secondary cataract, right eye: Secondary | ICD-10-CM | POA: Diagnosis not present

## 2015-06-29 DIAGNOSIS — H52203 Unspecified astigmatism, bilateral: Secondary | ICD-10-CM | POA: Diagnosis not present

## 2015-07-18 ENCOUNTER — Other Ambulatory Visit: Payer: Self-pay | Admitting: Cardiovascular Disease

## 2015-07-18 DIAGNOSIS — I701 Atherosclerosis of renal artery: Secondary | ICD-10-CM

## 2015-07-19 ENCOUNTER — Encounter: Payer: Self-pay | Admitting: Cardiovascular Disease

## 2015-07-19 ENCOUNTER — Ambulatory Visit (INDEPENDENT_AMBULATORY_CARE_PROVIDER_SITE_OTHER): Payer: PPO | Admitting: Cardiovascular Disease

## 2015-07-19 VITALS — BP 128/62 | HR 74 | Ht 69.0 in | Wt 194.0 lb

## 2015-07-19 DIAGNOSIS — I679 Cerebrovascular disease, unspecified: Secondary | ICD-10-CM | POA: Diagnosis not present

## 2015-07-19 DIAGNOSIS — I2510 Atherosclerotic heart disease of native coronary artery without angina pectoris: Secondary | ICD-10-CM | POA: Diagnosis not present

## 2015-07-19 DIAGNOSIS — I10 Essential (primary) hypertension: Secondary | ICD-10-CM

## 2015-07-19 DIAGNOSIS — Z79899 Other long term (current) drug therapy: Secondary | ICD-10-CM | POA: Diagnosis not present

## 2015-07-19 DIAGNOSIS — I493 Ventricular premature depolarization: Secondary | ICD-10-CM

## 2015-07-19 LAB — LIPID PANEL
Cholesterol: 129 mg/dL (ref 125–200)
HDL: 41 mg/dL (ref >=40)
LDL CALC: 62 mg/dL (ref <130)
Total CHOL/HDL Ratio: 3.1 Ratio (ref <=5.0)
Triglycerides: 128 mg/dL (ref <150)
VLDL: 26 mg/dL (ref <30)

## 2015-07-19 LAB — HEPATIC FUNCTION PANEL
ALK PHOS: 53 U/L (ref 40–115)
ALT: 17 U/L (ref 9–46)
AST: 18 U/L (ref 10–35)
Albumin: 4.3 g/dL (ref 3.6–5.1)
BILIRUBIN DIRECT: 0.2 mg/dL (ref <=0.2)
BILIRUBIN INDIRECT: 0.7 mg/dL (ref 0.2–1.2)
BILIRUBIN TOTAL: 0.9 mg/dL (ref 0.2–1.2)
TOTAL PROTEIN: 7.3 g/dL (ref 6.1–8.1)

## 2015-07-19 MED ORDER — SIMVASTATIN 40 MG PO TABS: 40.0000 mg | ORAL_TABLET | Freq: Every day | ORAL | Status: DC

## 2015-07-19 NOTE — Assessment & Plan Note (Signed)
History of CAD status post angioplasty back in 1994 and 95. He denies chest pain or shortness of breath. He had a low-risk nonischemic Myoview in 2011.

## 2015-07-19 NOTE — Progress Notes (Signed)
07/19/2015 DUMONT CHARANIA   [birth year]  [id number]  Primary Physician Laurey Morale, MD Primary Cardiologist: [name] MD Renae Gloss   HPI:  Dillon Young is a delightful 82 year old married Caucasian male father of 2 children, grandfather to 2 grandchildren he was formally a patient of Dr. [name]. 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 He has a history of coronary artery disease status post angioplasty in 1994 and 1995 Of his circumflex coronary artery. He has not required cardiac catheterization since stress Myoview was performed in 2011 that was low risk and nonischemic. He has a history of hypertension and hyperlipidemia on medication. He had right carotid endarterectomy performed by Dr. [name] Early June 2013 followed by duplex ultrasound. He denies chest pain or shortness of breath. His major complaint is of cramping in his thighs which may be related to his statin drug. He was given a statin holiday which really did not impact his symptoms and therefore statin was restarted and follow-up lipid profile performed 07/05/14 revealed a total cholesterol 131, LDL 58 and HDL of 44. Recent carotid Dopplers revealed his endarterectomy site was widely patent with moderate left ICA stenosis and moderate left subclavian artery stenosis without symptoms.  Current Outpatient Prescriptions  Medication Sig Dispense Refill  . amLODipine (NORVASC) 5 MG tablet Take 1 tablet (5 mg total) by mouth daily. 90 tablet 1  . aspirin EC 81 MG tablet Take 81 mg by mouth every evening.     . cholecalciferol (VITAMIN D) 1000 UNITS tablet Take 1,000 Units by mouth daily.      Marland Kitchen dicyclomine (BENTYL) 20 MG tablet Take 1 tablet (20 mg total) by mouth every 6 (six) hours as needed for spasms. 30 tablet 5  . fluticasone (FLONASE) 50 MCG/ACT nasal spray Place 2 sprays into the nose as needed.    .  irbesartan-hydrochlorothiazide (AVALIDE) 300-12.5 MG tablet Take 1 tablet by mouth daily. 90 tablet 0  . Melatonin 5 MG CAPS Take 1 capsule (5 mg total) by mouth at bedtime. 1 capsule 0  . metoprolol succinate (TOPROL-XL) 25 MG 24 hr tablet Take 1 tablet (25 mg total) by mouth every evening. Take with or immediately following a meal. 90 tablet 0  . simvastatin (ZOCOR) 40 MG tablet Take 1 tablet (40 mg total) by mouth at bedtime. 90 tablet 3  . vitamin C (ASCORBIC ACID) 500 MG tablet Take 500 mg by mouth daily.     . Wheat Dextrin (BENEFIBER) POWD Take 1 packet by mouth every morning.     No current facility-administered medications for this visit.    Allergies  Allergen Reactions  . Lipitor [Atorvastatin] Other (See Comments)    myalgias    Social History   Social History  . Marital Status: Married    Spouse Name: N/A  . Number of Children: 2  . Years of Education: N/A   Occupational History  . [employer]    Social History Main Topics  . Smoking status: Former Smoker -- 1.50 packs/day for 60 years    Types: Cigarettes    Quit date: 07/25/2008  . Smokeless tobacco: Never Used  . Alcohol Use: 0.0 oz/week    0 Standard drinks or equivalent per week     Comment: Occ  . Drug Use: No  . Sexual Activity: Not on file   Other Topics Concern  . Not on file  Social History Narrative     Review of Systems: General: negative for chills, fever, night sweats or weight changes.  Cardiovascular: negative for chest pain, dyspnea on exertion, edema, orthopnea, palpitations, paroxysmal nocturnal dyspnea or shortness of breath Dermatological: negative for rash Respiratory: negative for cough or wheezing Urologic: negative for hematuria Abdominal: negative for nausea, vomiting, diarrhea, bright red blood per rectum, melena, or hematemesis Neurologic: negative for visual changes, syncope, or dizziness All other systems reviewed and are otherwise negative except as noted  above.    Blood pressure 128/62, pulse 74, height 5\' 9"  (1.753 m), weight 194 lb (87.998 kg).  General appearance: alert and no distress Neck: no adenopathy, no JVD, supple, symmetrical, trachea midline and thyroid not enlarged, symmetric, no tenderness/mass/nodules Lungs: clear to auscultation bilaterally Heart: regular rate and rhythm, S1, S2 normal, no murmur, click, rub or gallop Extremities: extremities normal, atraumatic, no cyanosis or edema  EKG sinus rhythm at 74 with bigeminal PVCs. I personally reviewed this EKG  ASSESSMENT AND PLAN:   HYPERLIPIDEMIA History of hyperlipidemia on simvastatin. We will recheck a lipid and liver profile  Essential hypertension History of hypertension with blood pressure measures a at 120/62. He is on amlodipine, and Avalide as well as Toprol. Continue current meds at current dosing  Coronary atherosclerosis History of CAD status post angioplasty back in 1994 and 95. He denies chest pain or shortness of breath. He had a low-risk nonischemic Myoview in 2011.  Cerebrovascular disease History of carotid artery disease status post right carotid endarterectomy performed by Dr. [name] Early June 2013 which we've been following by duplex ultrasound. This was most recently performed 06/15/15 revealing a widely patent endarterectomy site, moderate left ICA stenosis with moderate left subclavian artery stenosis. He had antegrade vertebral flow bilaterally and is neurologically asymptomatic.  PVC's (premature ventricular contractions) History of a symptomatically PVCs which he shows on today's EKG. He has bigeminal PVCs which he is not aware of.      [name] MD [facility], [facility] 07/19/2015 9:39 AM

## 2015-07-19 NOTE — Assessment & Plan Note (Signed)
History of hyperlipidemia on simvastatin. We will recheck a lipid and liver profile 

## 2015-07-19 NOTE — Assessment & Plan Note (Signed)
History of hypertension with blood pressure measures a at 120/62. He is on amlodipine, and Avalide as well as Toprol. Continue current meds at current dosing

## 2015-07-19 NOTE — Patient Instructions (Signed)
Medication Instructions:  Your physician recommends that you continue on your current medications as directed. Please refer to the Current Medication list given to you today.   Labwork: Your physician recommends that you return for lab work in: FASTING (lipid/liver) The lab can be found on the FIRST FLOOR of out building in [address]   Testing/Procedures: none  Follow-Up: Your physician wants you to follow-up in: 12 months with Dr. Berry. You will receive a reminder letter in the mail two months in advance. If you don't receive a letter, please call our office to schedule the follow-up appointment.   Any Other Special Instructions Will Be Listed Below (If Applicable).     If you need a refill on your cardiac medications before your next appointment, please call your pharmacy.   

## 2015-07-19 NOTE — Assessment & Plan Note (Signed)
History of a symptomatically PVCs which he shows on today's EKG. He has bigeminal PVCs which he is not aware of.

## 2015-07-19 NOTE — Assessment & Plan Note (Signed)
History of carotid artery disease status post right carotid endarterectomy performed by Dr. [name] Early June 2013 which we've been following by duplex ultrasound. This was most recently performed 06/15/15 revealing a widely patent endarterectomy site, moderate left ICA stenosis with moderate left subclavian artery stenosis. He had antegrade vertebral flow bilaterally and is neurologically asymptomatic.

## 2015-07-21 ENCOUNTER — Telehealth: Payer: Self-pay | Admitting: *Deleted

## 2015-07-21 NOTE — Telephone Encounter (Signed)
Returned call to patient from 07/19/15---no answer

## 2015-08-02 ENCOUNTER — Ambulatory Visit (HOSPITAL_COMMUNITY)
Admission: RE | Admit: 2015-08-02 | Discharge: 2015-08-02 | Disposition: A | Discharge status: Home / Self Care | Payer: PPO | Source: Ambulatory Visit | Attending: Cardiovascular Disease | Admitting: Cardiovascular Disease

## 2015-08-02 ENCOUNTER — Other Ambulatory Visit: Payer: Self-pay

## 2015-08-02 DIAGNOSIS — I129 Hypertensive chronic kidney disease with stage 1 through stage 4 chronic kidney disease, or unspecified chronic kidney disease: Secondary | ICD-10-CM | POA: Diagnosis not present

## 2015-08-02 DIAGNOSIS — N189 Chronic kidney disease, unspecified: Secondary | ICD-10-CM | POA: Diagnosis not present

## 2015-08-02 DIAGNOSIS — I701 Atherosclerosis of renal artery: Secondary | ICD-10-CM | POA: Diagnosis not present

## 2015-08-02 DIAGNOSIS — R7303 Prediabetes: Secondary | ICD-10-CM | POA: Diagnosis not present

## 2015-08-02 DIAGNOSIS — E785 Hyperlipidemia, unspecified: Secondary | ICD-10-CM | POA: Insufficient documentation

## 2015-08-02 MED ORDER — AMLODIPINE BESYLATE 5 MG PO TABS: 5.0000 mg | ORAL_TABLET | Freq: Every day | ORAL | Status: DC

## 2015-08-06 ENCOUNTER — Encounter: Payer: Self-pay | Admitting: Cardiovascular Disease

## 2015-08-15 ENCOUNTER — Encounter: Payer: Self-pay | Admitting: Internal Medicine

## 2015-08-15 ENCOUNTER — Ambulatory Visit (INDEPENDENT_AMBULATORY_CARE_PROVIDER_SITE_OTHER): Payer: PPO | Admitting: Internal Medicine

## 2015-08-15 ENCOUNTER — Other Ambulatory Visit (INDEPENDENT_AMBULATORY_CARE_PROVIDER_SITE_OTHER): Payer: PPO

## 2015-08-15 VITALS — BP 120/60 | HR 56 | Temp 98.1°F | Resp 16 | Ht 69.0 in | Wt 194.0 lb

## 2015-08-15 DIAGNOSIS — G4700 Insomnia, unspecified: Secondary | ICD-10-CM | POA: Diagnosis not present

## 2015-08-15 DIAGNOSIS — I10 Essential (primary) hypertension: Secondary | ICD-10-CM

## 2015-08-15 DIAGNOSIS — E119 Type 2 diabetes mellitus without complications: Secondary | ICD-10-CM | POA: Diagnosis not present

## 2015-08-15 DIAGNOSIS — Z23 Encounter for immunization: Secondary | ICD-10-CM | POA: Diagnosis not present

## 2015-08-15 DIAGNOSIS — Z0000 Encounter for general adult medical examination without abnormal findings: Secondary | ICD-10-CM | POA: Diagnosis not present

## 2015-08-15 LAB — BASIC METABOLIC PANEL
BUN: 26 mg/dL — AB (ref 6–23)
CHLORIDE: 102 meq/L (ref 96–112)
CO2: 31 mEq/L (ref 19–32)
CREATININE: 1.11 mg/dL (ref 0.40–1.50)
Calcium: 9.7 mg/dL (ref 8.4–10.5)
GFR: 67.57 mL/min (ref >60.00)
Glucose, Bld: 138 mg/dL — ABNORMAL HIGH (ref 70–99)
Potassium: 4.2 mEq/L (ref 3.5–5.1)
Sodium: 141 mEq/L (ref 135–145)

## 2015-08-15 LAB — URINALYSIS, ROUTINE W REFLEX MICROSCOPIC
Bilirubin Urine: NEGATIVE
HGB URINE DIPSTICK: NEGATIVE
Ketones, ur: NEGATIVE
Leukocytes, UA: NEGATIVE
NITRITE: NEGATIVE
Specific Gravity, Urine: 1.01 (ref 1.000–1.030)
TOTAL PROTEIN, URINE-UPE24: NEGATIVE
Urine Glucose: NEGATIVE
Urobilinogen, UA: 0.2 (ref 0.0–1.0)
pH: 7 (ref 5.0–8.0)

## 2015-08-15 LAB — HEMOGLOBIN A1C: HEMOGLOBIN A1C: 6.6 % — AB (ref 4.6–6.5)

## 2015-08-15 LAB — MICROALBUMIN / CREATININE URINE RATIO
Creatinine,U: 122.2 mg/dL
MICROALB/CREAT RATIO: 5 mg/g (ref 0.0–30.0)
Microalb, Ur: 6.1 mg/dL — ABNORMAL HIGH (ref 0.0–1.9)

## 2015-08-15 LAB — TSH: TSH: 1.96 u[IU]/mL (ref 0.35–4.50)

## 2015-08-15 MED ORDER — DOXEPIN HCL 3 MG PO TABS: 1.0000 | ORAL_TABLET | Freq: Every evening | ORAL | Status: DC | PRN

## 2015-08-15 NOTE — Patient Instructions (Signed)

## 2015-08-15 NOTE — Progress Notes (Signed)
Pre visit review using our clinic review tool, if applicable. No additional management support is needed unless otherwise documented below in the visit note. 

## 2015-08-16 DIAGNOSIS — Z0000 Encounter for general adult medical examination without abnormal findings: Secondary | ICD-10-CM | POA: Insufficient documentation

## 2015-08-16 NOTE — Progress Notes (Signed)
Subjective:  Patient ID: Dillon Young, male    DOB: [birth year]  Age: 82 y.o. MRN: [id number]  CC: Hypertension; Hyperlipidemia; Diabetes; and Annual Exam   HPI Dillon Young presents for a AWV. He is new to me and wants to establish with a new physician.  He has prostate cancer and has been treated with radiation and chemotherapy. He has type 2 diabetes mellitus and has not had an A1c in about a year. His last A1c was 6.6% and he is not taking any medications for his blood sugar. He tells me that at home his blood sugars been well controlled with no polyuria, polydipsia, or polyphagia. He has chronic, intermittent palpitations and is followed by cardiology. He complains of chronic insomnia. He tells me that melatonin helps him fall asleep but then he has frequent awakenings.  Outpatient Prescriptions Prior to Visit  Medication Sig Dispense Refill  . amLODipine (NORVASC) 5 MG tablet Take 1 tablet (5 mg total) by mouth daily. 90 tablet 3  . aspirin EC 81 MG tablet Take 81 mg by mouth every evening.     . cholecalciferol (VITAMIN D) 1000 UNITS tablet Take 1,000 Units by mouth daily.      Marland Kitchen dicyclomine (BENTYL) 20 MG tablet Take 1 tablet (20 mg total) by mouth every 6 (six) hours as needed for spasms. 30 tablet 5  . fluticasone (FLONASE) 50 MCG/ACT nasal spray Place 2 sprays into the nose as needed.    . irbesartan-hydrochlorothiazide (AVALIDE) 300-12.5 MG tablet Take 1 tablet by mouth daily. 90 tablet 0  . Melatonin 5 MG CAPS Take 1 capsule (5 mg total) by mouth at bedtime. 1 capsule 0  . metoprolol succinate (TOPROL-XL) 25 MG 24 hr tablet Take 1 tablet (25 mg total) by mouth every evening. Take with or immediately following a meal. 90 tablet 0  . simvastatin (ZOCOR) 40 MG tablet Take 1 tablet (40 mg total) by mouth at bedtime. 90 tablet 3  . vitamin C (ASCORBIC ACID) 500 MG tablet Take 500 mg by mouth daily.     . Wheat Dextrin (BENEFIBER) POWD Take 1 packet by mouth every morning.      No facility-administered medications prior to visit.    ROS Review of Systems  Constitutional: Negative.  Negative for fever, chills, diaphoresis, appetite change and fatigue.  HENT: Negative.  Negative for trouble swallowing and voice change.   Eyes: Negative.  Negative for visual disturbance.  Respiratory: Negative.  Negative for cough, choking, chest tightness, shortness of breath and stridor.   Cardiovascular: Positive for palpitations. Negative for chest pain and leg swelling.  Gastrointestinal: Negative.  Negative for nausea, vomiting, abdominal pain, diarrhea, constipation and blood in stool.  Endocrine: Negative.  Negative for polydipsia, polyphagia and polyuria.  Genitourinary: Negative.  Negative for urgency, hematuria, enuresis and difficulty urinating.       Nocturia 1-2 times per night  Musculoskeletal: Negative.  Negative for myalgias, back pain, joint swelling, arthralgias and neck pain.  Skin: Negative.  Negative for color change and rash.  Allergic/Immunologic: Negative.   Neurological: Negative.  Negative for dizziness, syncope, speech difficulty, weakness, light-headedness, numbness and headaches.  Hematological: Negative.  Negative for adenopathy. Does not bruise/bleed easily.  Psychiatric/Behavioral: Positive for sleep disturbance. Negative for suicidal ideas, dysphoric mood and decreased concentration. The patient is not nervous/anxious.     Objective:  BP 120/60 mmHg  Pulse 56  Temp(Src) 98.1 F (36.7 C) (Oral)  Resp 16  Ht 5\' 9"  (1.753 m)  Wt 194 lb (87.998 kg)  BMI 28.64 kg/m2  SpO2 95%  BP Readings from Last 3 Encounters:  08/15/15 120/60  07/19/15 128/62  11/01/14 140/62    Wt Readings from Last 3 Encounters:  08/15/15 194 lb (87.998 kg)  07/19/15 194 lb (87.998 kg)  11/01/14 196 lb (88.905 kg)    Physical Exam  Constitutional: He is oriented to person, place, and time. He appears well-developed and well-nourished. No distress.  HENT:    Head: Normocephalic and atraumatic.  Mouth/Throat: Oropharynx is clear and moist. No oropharyngeal exudate.  Eyes: Conjunctivae are normal. Right eye exhibits no discharge. Left eye exhibits no discharge. No scleral icterus.  Neck: Normal range of motion. Neck supple. No JVD present. No tracheal deviation present. No thyromegaly present.  Cardiovascular: Normal rate, regular rhythm, normal heart sounds and intact distal pulses.  Exam reveals no gallop and no friction rub.   No murmur heard. Pulmonary/Chest: Effort normal and breath sounds normal. No stridor. No respiratory distress. He has no wheezes. He has no rales. He exhibits no tenderness.  Abdominal: Soft. Bowel sounds are normal. He exhibits no distension and no mass. There is no tenderness. There is no rebound and no guarding.  Musculoskeletal: Normal range of motion. He exhibits no edema or tenderness.  Lymphadenopathy:    He has no cervical adenopathy.  Neurological: He is oriented to person, place, and time.  Skin: Skin is warm and dry. No rash noted. He is not diaphoretic. No erythema. No pallor.  Psychiatric: He has a normal mood and affect. His behavior is normal. Judgment and thought content normal.  Vitals reviewed.   Lab Results  Component Value Date   WBC 6.5 07/05/2014   HGB 14.3 07/05/2014   HCT 42.4 07/05/2014   PLT 240.0 07/05/2014   GLUCOSE 138* 08/15/2015   CHOL 129 07/19/2015   TRIG 128 07/19/2015   HDL 41 07/19/2015   LDLCALC 62 07/19/2015   ALT 17 07/19/2015   AST 18 07/19/2015   NA 141 08/15/2015   K 4.2 08/15/2015   CL 102 08/15/2015   CREATININE 1.11 08/15/2015   BUN 26* 08/15/2015   CO2 31 08/15/2015   TSH 1.96 08/15/2015   PSA 0.01* 07/05/2014   INR 1.04 11/07/2011   HGBA1C 6.6* 08/15/2015   MICROALBUR 6.1* 08/15/2015    No results found.  Assessment & Plan:   Dillon Young was seen today for hypertension, hyperlipidemia, diabetes and annual exam.  Diagnoses and all orders for this  visit:  Essential hypertension- his blood pressure is well-controlled, electrolytes and renal function are stable. -     Basic metabolic panel; Future -     TSH; Future -     Urinalysis, Routine w reflex microscopic (not at [facility]); Future  Diabetes mellitus without complication ([place])- his [id number] remains at 6.6%, he does not need a medication to treat this, he will continue to work on his lifestyle modifications. -     Basic metabolic panel; Future -     Microalbumin / creatinine urine ratio; Future -     Hemoglobin A1c; Future  Middle insomnia - he will continue taking melatonin to induce sleep but since he has difficulty with sleep maintenance I have also asked him to add doxepin 3 mg at bedtime. -     Doxepin HCl (SILENOR) 3 MG TABS; Take 1 tablet (3 mg total) by mouth at bedtime as needed.  Need for vaccination with 13-polyvalent pneumococcal conjugate vaccine -     Pneumococcal  conjugate vaccine 13-valent   I am having Mr. Willner start on Doxepin HCl. I am also having him maintain his vitamin C, cholecalciferol, aspirin EC, BENEFIBER, fluticasone, Melatonin, dicyclomine, irbesartan-hydrochlorothiazide, metoprolol succinate, simvastatin, and amLODipine.  Meds ordered this encounter  Medications  . Doxepin HCl (SILENOR) 3 MG TABS    Sig: Take 1 tablet (3 mg total) by mouth at bedtime as needed.    Dispense:  30 tablet    Refill:  11     Follow-up: Return in about 4 months (around 12/15/2015).  [name], MD

## 2015-08-16 NOTE — Assessment & Plan Note (Signed)

## 2015-08-26 DIAGNOSIS — Z8546 Personal history of malignant neoplasm of prostate: Secondary | ICD-10-CM | POA: Diagnosis not present

## 2015-08-30 ENCOUNTER — Telehealth: Payer: Self-pay | Admitting: Internal Medicine

## 2015-08-30 DIAGNOSIS — K648 Other hemorrhoids: Secondary | ICD-10-CM

## 2015-08-30 MED ORDER — HYDROCORTISONE 2.5 % RE CREA: TOPICAL_CREAM | Freq: Two times a day (BID) | RECTAL | Status: AC

## 2015-08-30 NOTE — Telephone Encounter (Signed)
The patient was here with his wife today, his hemorrhoids are bothering him a little bit again. He was asking for refill of hydrocortisone cream which I did. He will come in for a visit as well to reassess the hemorrhoids, in the past I had not banded where he had had radiation for prostate cancer in the right anterior position but we could consider that.

## 2015-08-31 DIAGNOSIS — R351 Nocturia: Secondary | ICD-10-CM | POA: Diagnosis not present

## 2015-08-31 DIAGNOSIS — Z0000 Encounter for general adult medical examination without abnormal findings: Secondary | ICD-10-CM | POA: Diagnosis not present

## 2015-08-31 DIAGNOSIS — R1032 Left lower quadrant pain: Secondary | ICD-10-CM | POA: Diagnosis not present

## 2015-08-31 DIAGNOSIS — N200 Calculus of kidney: Secondary | ICD-10-CM | POA: Diagnosis not present

## 2015-08-31 DIAGNOSIS — Z8546 Personal history of malignant neoplasm of prostate: Secondary | ICD-10-CM | POA: Diagnosis not present

## 2015-09-01 ENCOUNTER — Other Ambulatory Visit: Payer: Self-pay | Admitting: *Deleted

## 2015-09-01 MED ORDER — METOPROLOL SUCCINATE ER 25 MG PO TB24: 25.0000 mg | ORAL_TABLET | Freq: Every evening | ORAL | Status: DC

## 2015-09-01 MED ORDER — IRBESARTAN-HYDROCHLOROTHIAZIDE 300-12.5 MG PO TABS
1.0000 | ORAL_TABLET | Freq: Every day | ORAL | Status: DC
Start: 2015-09-01 — End: 2015-09-02

## 2015-09-02 ENCOUNTER — Other Ambulatory Visit: Payer: Self-pay

## 2015-09-02 MED ORDER — METOPROLOL SUCCINATE ER 25 MG PO TB24: 25.0000 mg | ORAL_TABLET | Freq: Every evening | ORAL | Status: DC

## 2015-09-02 MED ORDER — IRBESARTAN-HYDROCHLOROTHIAZIDE 300-12.5 MG PO TABS: 1.0000 | ORAL_TABLET | Freq: Every day | ORAL | Status: DC

## 2015-09-15 ENCOUNTER — Ambulatory Visit: Payer: Self-pay | Admitting: Podiatry

## 2015-10-07 ENCOUNTER — Encounter: Payer: Self-pay | Admitting: Internal Medicine

## 2015-10-07 ENCOUNTER — Ambulatory Visit (INDEPENDENT_AMBULATORY_CARE_PROVIDER_SITE_OTHER): Payer: PPO | Admitting: Internal Medicine

## 2015-10-07 VITALS — BP 160/66 | HR 84 | Ht 69.0 in | Wt 198.4 lb

## 2015-10-07 DIAGNOSIS — K5900 Constipation, unspecified: Secondary | ICD-10-CM | POA: Diagnosis not present

## 2015-10-07 DIAGNOSIS — K5909 Other constipation: Secondary | ICD-10-CM

## 2015-10-07 DIAGNOSIS — K648 Other hemorrhoids: Secondary | ICD-10-CM | POA: Diagnosis not present

## 2015-10-07 MED ORDER — BENEFIBER PO POWD: ORAL | Status: DC

## 2015-10-07 MED ORDER — POLYETHYLENE GLYCOL 3350 17 GM/SCOOP PO POWD: 17.0000 g | ORAL | Status: DC | PRN

## 2015-10-07 NOTE — Patient Instructions (Signed)
  Please use benefiber , handout provided.   Follow up with Dr Carlean Purl as needed.     I appreciate the opportunity to care for you. Silvano Rusk, MD, [facility]

## 2015-10-07 NOTE — Assessment & Plan Note (Signed)
Gr 2 RP others Gr 1 Tx constipation - Benefiber

## 2015-10-07 NOTE — Assessment & Plan Note (Signed)
Benefiber + /- miraLax

## 2015-10-07 NOTE — Progress Notes (Signed)
   Subjective:    Patient ID: Dillon Young, male    DOB: 14-Jun-1932, 82 y.o.   MRN: [id number] Cc: hemorrhoids HPI Hx banding of RP and LL internal hemorrhoids 2015 - did well overall. Has XRT proctitis so stayed away from RA area. He has recently had some prolapse and bleeding, and has also had some worsening constipation. Uses MiraLax but has had difficulty titrating to be regular. Will get diarrhea at times so stops MiraLax and then gets constipated.  Medications, allergies, past medical history, past surgical history, family history and social history are reviewed and updated in the EMR. Review of Systems As above    Objective:   Physical Exam BP 160/66 mmHg  Pulse 84  Ht 5\' 9"  (1.753 m)  Wt 198 lb 6.4 oz (89.994 kg)  BMI 29.29 kg/m2 NAD Rectal - small bulge at RO area - NL tone, no mass, firm brown stool. Anoscopy Gr 1 RA and LL internal hemorrhoid and RP grade 1-2. Mildly inflamed    Assessment & Plan:  Hemorrhoids, internal, with bleeding, prolapse and itching Gr 2 RP others Gr 1 Tx constipation - Benefiber  Constipation, chronic Benefiber + /- miraLax    Cc:[name], MD

## 2015-11-15 ENCOUNTER — Telehealth: Payer: Self-pay | Admitting: Cardiovascular Disease

## 2015-11-15 NOTE — Telephone Encounter (Signed)
There is no interaction with these.  Muscle problems are most likely related to high dose of simvastatin.  Have him hold x 2 weeks, then start atorvastatin 20 mg once daily.

## 2015-11-15 NOTE — Telephone Encounter (Signed)
Returned call to patient.He stated he is taking Irbesartan/HCTZ 300/12.5 mg and Simvastatin 40 mg daily.Stated he was told by pharmacist those 2 medications interact with each other.Stated he has been having muscle pain in legs.Advised to hold Simvastatin.I will send message to [facility] for advice.

## 2015-11-15 NOTE — Telephone Encounter (Signed)
Mr. Bjerk is calling because he feels that the Losartan  HCT 100/25 and he now take Irbesartan 300-12.5mg  and he believes that it is not agreeing with his Simvastatin 40mg .. Please Call  Thanks

## 2015-11-16 ENCOUNTER — Telehealth: Payer: Self-pay | Admitting: Cardiovascular Disease

## 2015-11-16 NOTE — Telephone Encounter (Signed)
Patient returned call (noted in a different telephone encounter) Patient informed of clinical pharmacist's recommendations He cannot take lipitor - myalgias He would like to try taking simvastatin QOD  Advised this is OK - call in about 2 weeks to let us know how he is doing.   Will route to [facility] & Dr. [name] as Juluis Rainier

## 2015-11-16 NOTE — Telephone Encounter (Signed)
No answer. Left message to call back.   

## 2015-11-16 NOTE — Telephone Encounter (Signed)
F/u Message ° °Pt wife returning RN call. Please call back to discuss  °

## 2015-11-16 NOTE — Telephone Encounter (Signed)
Handled in another telephone note.  

## 2015-12-15 ENCOUNTER — Telehealth: Payer: Self-pay | Admitting: Emergency Medicine

## 2015-12-15 ENCOUNTER — Encounter: Payer: Self-pay | Admitting: Internal Medicine

## 2015-12-15 ENCOUNTER — Other Ambulatory Visit (INDEPENDENT_AMBULATORY_CARE_PROVIDER_SITE_OTHER): Payer: PPO

## 2015-12-15 ENCOUNTER — Ambulatory Visit (INDEPENDENT_AMBULATORY_CARE_PROVIDER_SITE_OTHER): Payer: PPO | Admitting: Internal Medicine

## 2015-12-15 ENCOUNTER — Telehealth: Payer: Self-pay | Admitting: Cardiovascular Disease

## 2015-12-15 VITALS — BP 170/58 | HR 35 | Temp 97.8°F | Resp 16 | Ht 69.0 in | Wt 196.0 lb

## 2015-12-15 DIAGNOSIS — I10 Essential (primary) hypertension: Secondary | ICD-10-CM

## 2015-12-15 DIAGNOSIS — E119 Type 2 diabetes mellitus without complications: Secondary | ICD-10-CM

## 2015-12-15 DIAGNOSIS — R001 Bradycardia, unspecified: Secondary | ICD-10-CM

## 2015-12-15 DIAGNOSIS — E785 Hyperlipidemia, unspecified: Secondary | ICD-10-CM

## 2015-12-15 LAB — BASIC METABOLIC PANEL
BUN: 26 mg/dL — AB (ref 6–23)
CO2: 32 mEq/L (ref 19–32)
CREATININE: 1.34 mg/dL (ref 0.40–1.50)
Calcium: 9.8 mg/dL (ref 8.4–10.5)
Chloride: 103 mEq/L (ref 96–112)
GFR: 54.32 mL/min — AB (ref >60.00)
Glucose, Bld: 126 mg/dL — ABNORMAL HIGH (ref 70–99)
POTASSIUM: 4.4 meq/L (ref 3.5–5.1)
Sodium: 142 mEq/L (ref 135–145)

## 2015-12-15 LAB — THYROID PANEL WITH TSH
Free Thyroxine Index: 2.5 (ref 1.4–3.8)
T3 Uptake: 32 % (ref 22–35)
T4 TOTAL: 7.7 ug/dL (ref 4.5–12.0)
TSH: 1.63 mIU/L (ref 0.40–4.50)

## 2015-12-15 LAB — HEMOGLOBIN A1C: HEMOGLOBIN A1C: 6.5 % (ref 4.6–6.5)

## 2015-12-15 NOTE — Telephone Encounter (Signed)
Called pt back and he stated that he has everything straightened out.

## 2015-12-15 NOTE — Telephone Encounter (Signed)
Pt called and has questions about his medications. He has decided he wants to lower his medications now instead of waiting to see the heart doctor. Please advise thanks.

## 2015-12-15 NOTE — Patient Instructions (Signed)

## 2015-12-15 NOTE — Progress Notes (Signed)
Pre visit review using our clinic review tool, if applicable. No additional management support is needed unless otherwise documented below in the visit note. 

## 2015-12-15 NOTE — Progress Notes (Signed)
Subjective:  Patient ID: Dillon Young, male    DOB: [birth year]  Age: 82 y.o. MRN: [id number]  CC: Hypertension and Diabetes   HPI [name] presents for a follow-up on hypertension and diabetes. About one month ago he was working in his yard and developed the acute onset of dizziness, lightheadedness, and generalized weakness. He had a brief episode of SOB. He states in general those symptoms have improved over the last month but there is some persistence. When he was working in the yard he felt presyncopal but did not pass out. He has had no episodes of presyncope since then. He has not noticed any episodes of palpitations, chest pain, dyspnea on exertion, edema, or dysuria.  Outpatient Prescriptions Prior to Visit  Medication Sig Dispense Refill  . amLODipine (NORVASC) 5 MG tablet Take 1 tablet (5 mg total) by mouth daily. 90 tablet 3  . aspirin EC 81 MG tablet Take 81 mg by mouth every evening.     . cholecalciferol (VITAMIN D) 1000 UNITS tablet Take 1,000 Units by mouth daily.      Marland Kitchen dicyclomine (BENTYL) 20 MG tablet Take 1 tablet (20 mg total) by mouth every 6 (six) hours as needed for spasms. 30 tablet 5  . Doxepin HCl (SILENOR) 3 MG TABS Take 1 tablet (3 mg total) by mouth at bedtime as needed. 30 tablet 11  . fluticasone (FLONASE) 50 MCG/ACT nasal spray Place 2 sprays into the nose as needed.    . irbesartan-hydrochlorothiazide (AVALIDE) 300-12.5 MG tablet Take 1 tablet by mouth daily. 90 tablet 3  . Melatonin 5 MG CAPS Take 1 capsule (5 mg total) by mouth at bedtime. 1 capsule 0  . metoprolol succinate (TOPROL-XL) 25 MG 24 hr tablet Take 1 tablet (25 mg total) by mouth every evening. Take with or immediately following a meal. 90 tablet 3  . polyethylene glycol powder (GLYCOLAX/MIRALAX) powder Take 17 g by mouth as needed. 255 g 3  . simvastatin (ZOCOR) 40 MG tablet Take 1 tablet (40 mg total) by mouth at bedtime. 90 tablet 3  . vitamin C (ASCORBIC ACID) 500 MG tablet Take  500 mg by mouth daily.     . Wheat Dextrin (BENEFIBER) POWD 2 tablespoons a day    . hydrocortisone (ANUSOL-HC) 2.5 % rectal cream Place 1 application rectally 2 (two) times daily. Reported on 12/15/2015. Pt uses PRN     No facility-administered medications prior to visit.    ROS Review of Systems  Constitutional: Positive for fatigue. Negative for activity change, appetite change and unexpected weight change.  HENT: Negative.  Negative for trouble swallowing.   Eyes: Negative.  Negative for visual disturbance.  Respiratory: Negative.  Negative for cough, choking, chest tightness, shortness of breath and stridor.   Cardiovascular: Negative.  Negative for chest pain, palpitations and leg swelling.  Gastrointestinal: Negative.  Negative for nausea, vomiting, abdominal pain, diarrhea and constipation.  Endocrine: Negative.   Genitourinary: Negative.   Musculoskeletal: Negative.  Negative for back pain and neck pain.  Skin: Negative.  Negative for color change and rash.  Allergic/Immunologic: Negative.   Neurological: Positive for dizziness, weakness and light-headedness. Negative for syncope, numbness and headaches.  Hematological: Negative.  Negative for adenopathy. Does not bruise/bleed easily.  Psychiatric/Behavioral: Negative.     Objective:  BP 170/58 mmHg  Pulse 35  Temp(Src) 97.8 F (36.6 C) (Oral)  Resp 16  Ht 5\' 9"  (1.753 m)  Wt 196 lb (88.905 kg)  BMI 28.93 kg/m2  SpO2 97%  BP Readings from Last 3 Encounters:  12/15/15 170/58  10/07/15 160/66  08/15/15 120/60    Wt Readings from Last 3 Encounters:  12/15/15 196 lb (88.905 kg)  10/07/15 198 lb 6.4 oz (89.994 kg)  08/15/15 194 lb (87.998 kg)    Physical Exam  Constitutional: He is oriented to person, place, and time. No distress.  HENT:  Mouth/Throat: Oropharynx is clear and moist. No oropharyngeal exudate.  Eyes: Conjunctivae are normal. Right eye exhibits no discharge. Left eye exhibits no discharge. No  scleral icterus.  Neck: Normal range of motion. Neck supple. No JVD present. No tracheal deviation present. No thyromegaly present.  Cardiovascular: Regular rhythm, S1 normal, S2 normal, normal heart sounds and intact distal pulses.   Occasional extrasystoles are present. Bradycardia present.  Exam reveals no gallop and no friction rub.   No murmur heard. EKG ---  Sinus  Bradycardia  -First degree A-V block  -Frequent pvcs -ventricular trigeminy  PRi = 222 Low voltage in limb leads.   ABNORMAL   Pulmonary/Chest: Effort normal and breath sounds normal. No stridor. No respiratory distress. He has no wheezes. He has no rales. He exhibits no tenderness.  Abdominal: Soft. Bowel sounds are normal. He exhibits no distension and no mass. There is no tenderness. There is no rebound and no guarding.  Musculoskeletal: Normal range of motion. He exhibits no edema or tenderness.  Lymphadenopathy:    He has no cervical adenopathy.  Neurological: He is oriented to person, place, and time.  Skin: Skin is warm and dry. No rash noted. He is not diaphoretic. No erythema. No pallor.    Lab Results  Component Value Date   WBC 6.5 07/05/2014   HGB 14.3 07/05/2014   HCT 42.4 07/05/2014   PLT 240.0 07/05/2014   GLUCOSE 126* 12/15/2015   CHOL 129 07/19/2015   TRIG 128 07/19/2015   HDL 41 07/19/2015   LDLCALC 62 07/19/2015   ALT 17 07/19/2015   AST 18 07/19/2015   NA 142 12/15/2015   K 4.4 12/15/2015   CL 103 12/15/2015   CREATININE 1.34 12/15/2015   BUN 26* 12/15/2015   CO2 32 12/15/2015   TSH 1.63 12/15/2015   PSA 0.01* 07/05/2014   INR 1.04 11/07/2011   HGBA1C 6.5 12/15/2015   MICROALBUR 6.1* 08/15/2015    No results found.  Assessment & Plan:   Nymir was seen today for hypertension and diabetes.  Diagnoses and all orders for this visit:  Bradycardia- EKG shows first-degree AV block with PVCs, I have asked him to see cardiology to be evaluated for sinoatrial node dysfunction,  treatment of PVCs with further workup. -     EKG 12-Lead -     Ambulatory referral to Cardiology -     Thyroid Panel With TSH; Future  Diabetes mellitus without complication ([place])- see [id number] is 6.5%, his blood sugars are adequately well controlled. -     Basic metabolic panel; Future -     Hemoglobin A1c; Future  Essential hypertension- his systolic today is [id number] indicating his blood pressure is not adequately well controlled, I will defer more aggressive treatment of his systolic hypertension after the bradycardia is evaluated. -     Basic metabolic panel; Future  Hyperlipidemia with target LDL less than 70- he has achieved his LDL goal is doing well on statin therapy.  Other orders -     Cancel: EKG 12-Lead   I have discontinued Mr. Quiett's diphenhydrAMINE. I am also having  him maintain his vitamin C, cholecalciferol, aspirin EC, fluticasone, Melatonin, dicyclomine, simvastatin, amLODipine, Doxepin HCl, irbesartan-hydrochlorothiazide, metoprolol succinate, BENEFIBER, polyethylene glycol powder, and hydrocortisone.  Meds ordered this encounter  Medications  . DISCONTD: diphenhydrAMINE (BENADRYL) 25 MG tablet    Sig: Take 25 mg by mouth as needed.     Follow-up: Return in about 6 months (around 06/16/2016).  [name], MD

## 2015-12-15 NOTE — Telephone Encounter (Signed)
Returned call to patient.He stated he saw PCP Dr.Jones this morning.Stated he was told he had a slow heart beat.Stated he feels ok except he is weak.12/15/15 EKG reviewed from Dr.Jones office heart rate 57.Also stated he continues to have alittle muscle weakness in legs.He is taking simvastatin 40 mg every other day.Stated he will continue for now.Advised to call back if he continues to have muscle weakness.Advised to continue same medications.Appointment scheduled with Dr.Berry 01/18/16 9:15 am.Advised to call sooner if needed.

## 2015-12-15 NOTE — Telephone Encounter (Signed)
New message   Pt c/o medication issue:  1. Name of Medication: Simvastation and Amlodipine   2. How are you currently taking this medication (dosage and times per day)? 40mg ; 5mg    3. Are you having a reaction (difficulty breathing--STAT)? Muscle tightness   4. What is your medication issue? Pt states he is experiencing a low heart rate and his pcp states he wants to lower dosage on amlodipine. Please call back to discuss

## 2015-12-17 ENCOUNTER — Encounter: Payer: Self-pay | Admitting: Internal Medicine

## 2016-01-02 ENCOUNTER — Telehealth: Payer: Self-pay | Admitting: Cardiovascular Disease

## 2016-01-02 NOTE — Telephone Encounter (Signed)
Returned call to patient.He stated he has been having chest pain and sob off and on for the past 3 weeks.Stated when he walked this morning had chest tightness. No chest pain at present.Appointment scheduled with [name] NP 8/9/17at 9:00 am at [facility] office.Advised to go to ER if needed.

## 2016-01-02 NOTE — Telephone Encounter (Signed)
New message    Patient calling    Pt c/o of Chest Pain: STAT if CP now or developed within 24 hours  1. Are you having CP right now? Tightness in chest  / not now  /sitting in chest   2. Are you experiencing any other symptoms (ex. SOB, nausea, vomiting, sweating)? No   3. How long have you been experiencing CP? About  A month   4. Is your CP continuous or coming and going? Coming  /going   5. Have you taken Nitroglycerin? No  ?

## 2016-01-03 ENCOUNTER — Encounter: Payer: Self-pay | Admitting: Cardiology

## 2016-01-03 NOTE — Progress Notes (Signed)
Cardiology Office Note   Date:  01/04/2016   ID:  Dillon Young 19-Jun-1932, MRN [id number]  PCP:  [name], MD  Cardiologist:  Dr. Adora Fridge     Chief Complaint  Patient presents with  . Chest Pain    with walking      History of Present Illness: Dillon Young is a 82 y.o. male who presents for chest pain.  He called Monday with a bout of chest pain.  When he was seen by PCP in July His HR was 35 but EKG reveals SR with PVCs.  And pulse is 57.  Pt has hx of PVCs.   Pt stated his pain began 3 weeks ago with first episode with digging in the yard on a hot day he had chest pain and had to come into the house and cool down and pain resolved.  But over last 3 weeks with walking or climbing stairs pain will occur.  BP has been slightly higher as well.       He has a history of coronary artery disease status post angioplasty in 1994 and 1995 Of his circumflex coronary artery. He has not required cardiac catheterization since stress Myoview was performed in 2011 that was low risk and nonischemic. He has a history of hypertension and hyperlipidemia on medication. He had right carotid endarterectomy performed by Dr. [name] Early June 2013 followed by duplex ultrasound. He denies chest pain or shortness of breath. His major complaint is of cramping in his thighs which may be related to his statin drug. He was given a statin holiday which really did not impact his symptoms and therefore statin was restarted and follow-up lipid profile performed 07/05/14 revealed a total cholesterol 131, LDL 58 and HDL of 44. Recent carotid Dopplers revealed his endarterectomy site was widely patent with moderate left ICA stenosis and moderate left subclavian artery stenosis without symptoms.  Today as above, no pain wakes from sleep.  Discussed nuc study vs. Cardiac cath and pt would prefer nuc study  This is reasonable though if the nuc is negative I will send to Dr. [name] and plan may be for cath anyway.  If  + will need cath.  Pt and wife agreeable to this plan.    He also complains of leg pain and believes it is due to zocor.  Will change to crestor.     Past Medical History:  Diagnosis Date  . Adenomatous colon polyp   . Allergic rhinitis   . BPH (benign prostatic hyperplasia)   . CAD (coronary artery disease)    sees Dr. [name]   . Carotid artery disease ([place])   . Chronic kidney disease    [place]. Prostate cancer  . CVA (cerebral infarction)   . Diabetes mellitus    "Borderline"  . Diverticulitis   . GERD (gastroesophageal reflux disease)    history  . Hemorrhoids   . HTN (hypertension)   . Hyperlipidemia    echo 10-25-2010 mild abnormalities  . IBS (irritable bowel syndrome)   . Low back pain   . Prostate cancer ([place]) 2008   seed implantation  . Sleep apnea    last study was aborted.  Has never worn a CPAP.    Marland Kitchen Smoker     Past Surgical History:  Procedure Laterality Date  . APPENDECTOMY    . Cataract with Lens     Bilateral  . COLONOSCOPY  10/21/2008   diverticulosis, radiation proctitis, internal hemorrhoids  .  ENDARTERECTOMY  11/12/2011   Procedure: ENDARTERECTOMY CAROTID;  Surgeon: Rosetta Posner, MD;  Location: [facility] OR;  Service: Vascular;  Laterality: Right;  . Esophageal dilitation    . HEMORRHOID BANDING    . Lithrotripsy      x 3  . LUMBAR LAMINECTOMY    . TRANSURETHRAL RESECTION OF PROSTATE  10/2003     Current Outpatient Prescriptions  Medication Sig Dispense Refill  . amLODipine (NORVASC) 5 MG tablet Take 1 tablet (5 mg total) by mouth daily. 90 tablet 3  . aspirin EC 81 MG tablet Take 81 mg by mouth every evening.     . cholecalciferol (VITAMIN D) 1000 UNITS tablet Take 1,000 Units by mouth daily.      Marland Kitchen dicyclomine (BENTYL) 20 MG tablet Take 1 tablet (20 mg total) by mouth every 6 (six) hours as needed for spasms. 30 tablet 5  . Doxepin HCl (SILENOR) 3 MG TABS Take 1 tablet (3 mg total) by mouth at bedtime as needed. 30 tablet 11  . fluticasone  (FLONASE) 50 MCG/ACT nasal spray Place 2 sprays into the nose as needed.    . hydrocortisone (ANUSOL-HC) 2.5 % rectal cream Place 1 application rectally 2 (two) times daily. Reported on 12/15/2015. Pt uses PRN    . irbesartan-hydrochlorothiazide (AVALIDE) 300-12.5 MG tablet Take 1 tablet by mouth daily. 90 tablet 3  . Melatonin 5 MG CAPS Take 1 capsule (5 mg total) by mouth at bedtime. 1 capsule 0  . metoprolol succinate (TOPROL-XL) 25 MG 24 hr tablet Take 1 tablet (25 mg total) by mouth every evening. Take with or immediately following a meal. 90 tablet 3  . polyethylene glycol powder (GLYCOLAX/MIRALAX) powder Take 17 g by mouth as needed. 255 g 3  . vitamin C (ASCORBIC ACID) 500 MG tablet Take 500 mg by mouth daily.     . Wheat Dextrin (BENEFIBER) POWD 2 tablespoons a day    . nitroGLYCERIN (NITROSTAT) 0.4 MG SL tablet Place 1 tablet (0.4 mg total) under the tongue every 5 (five) minutes as needed for chest pain. 25 tablet 4  . rosuvastatin (CRESTOR) 10 MG tablet Take 1 tablet (10 mg total) by mouth daily. 90 tablet 3   No current facility-administered medications for this visit.     Allergies:   Lipitor [atorvastatin]    Social History:  The patient  reports that he quit smoking about 7 years ago. His smoking use included Cigarettes. He has a 90.00 pack-year smoking history. He has never used smokeless tobacco. He reports that he drinks alcohol. He reports that he does not use drugs.   Family History:  The patient's family history includes Heart disease in his father.    ROS:  General:no colds or fevers, no weight changes Skin:no rashes or ulcers HEENT:no blurred vision, no congestion CV:see HPI PUL:see HPI GI:no diarrhea constipation or melena, no indigestion GU:no hematuria, no dysuria MS:no joint pain, no claudication. + muscular pain in both legs relates it to the zocor.   Neuro:no syncope, no lightheadedness Endo:no diabetes, no thyroid disease  Wt Readings from Last 3  Encounters:  01/04/16 194 lb (88 kg)  12/15/15 196 lb (88.9 kg)  10/07/15 198 lb 6.4 oz (90 kg)     PHYSICAL EXAM: VS:  BP (!) 150/70   Pulse (!) 56   Ht 5\' 9"  (1.753 m)   Wt 194 lb (88 kg)   BMI 28.65 kg/m  , BMI Body mass index is 28.65 kg/m. General:Pleasant affect, NAD Skin:Warm  and dry, brisk capillary refill HEENT:normocephalic, sclera clear, mucus membranes moist Neck:supple, no JVD, no bruits  Heart:S1S2 RRR without murmur, gallup, rub or click Lungs:clear without rales, rhonchi, or wheezes [id number], non tender, + BS, do not palpate liver spleen or masses Ext:no lower ext edema, 2+ pedal pulses, 2+ radial pulses Neuro:alert and oriented X 3, MAE, follows commands, + facial symmetry    EKG:  EKG is ordered today. The ekg ordered today demonstrates SB with PVCs trigeminy pattern.  No acute changes.     Recent Labs: 07/19/2015: ALT 17 12/15/2015: BUN 26; Creatinine, Ser 1.34; Potassium 4.4; Sodium 142; TSH 1.63    Lipid Panel    Component Value Date/Time   CHOL 129 07/19/2015 1007   TRIG 128 07/19/2015 1007   HDL 41 07/19/2015 1007   CHOLHDL 3.1 07/19/2015 1007   VLDL 26 07/19/2015 1007   LDLCALC 62 07/19/2015 1007       Other studies Reviewed: Additional studies/ records that were reviewed today include: nuc 2011 with no ischemia.  .   ASSESSMENT AND PLAN:  Angina, crescendo - began 3 weeks ago and now with just walking  discussed and cardiac cath and nuc study.  Pt would prefer nuc study.  Discussed as aboe will plan for this week or early next week.  Will send copy of note to Dr. [name] --if nuc is neg but pain continues my need cath anyway  He does have appt with Dr. [name] on the 23rd.    HYPERLIPIDEMIA History of hyperlipidemia on simvastatin. Labs recently were good but with leg discomfort with zocor pt would like to change to Crestor so will place on 10 mg daily. We will recheck a lipid and liver profile in 6 weeks.  Essential hypertension History  of hypertension with blood pressure measures a at 150/70 He is on amlodipine, and Avalide as well as Toprol. Continue current meds at current dosing- depending on stress test may need to increase amlodipine back to 10 mg.    Coronary atherosclerosis History of CAD status post angioplasty back in 1994 and 95. He denies chest pain or shortness of breath. He had a low-risk nonischemic Myoview in 2011.  See above.   Cerebrovascular disease History of carotid artery disease status post right carotid endarterectomy performed by Dr. [name] Early June 2013 which we've been following by duplex ultrasound. This was most recently performed 06/15/15 revealing a widely patent endarterectomy site, moderate left ICA stenosis with moderate left subclavian artery stenosis. He had antegrade vertebral flow bilaterally and is neurologically asymptomatic.  PVC's (premature ventricular contractions) History of a symptomatically PVCs which he shows on today's EKG. He has trigeminal PVCs which he is not aware of.    Current medicines are reviewed with the patient today.  The patient Has no concerns regarding medicines.  The following changes have been made:  See above Labs/ tests ordered today include:see above  Disposition:   FU:  see above  Signed, [name], NP  01/04/2016 10:23 AM    [name], [place], [place] [address] [place], [place] Phone: [phone]; Fax: [phone]

## 2016-01-04 ENCOUNTER — Encounter: Payer: Self-pay | Admitting: Cardiology

## 2016-01-04 ENCOUNTER — Ambulatory Visit (INDEPENDENT_AMBULATORY_CARE_PROVIDER_SITE_OTHER): Payer: PPO | Admitting: Cardiology

## 2016-01-04 VITALS — BP 150/70 | HR 56 | Ht 69.0 in | Wt 194.0 lb

## 2016-01-04 DIAGNOSIS — I739 Peripheral vascular disease, unspecified: Secondary | ICD-10-CM | POA: Diagnosis not present

## 2016-01-04 DIAGNOSIS — I493 Ventricular premature depolarization: Secondary | ICD-10-CM

## 2016-01-04 DIAGNOSIS — I209 Angina pectoris, unspecified: Secondary | ICD-10-CM | POA: Diagnosis not present

## 2016-01-04 DIAGNOSIS — I2510 Atherosclerotic heart disease of native coronary artery without angina pectoris: Secondary | ICD-10-CM | POA: Diagnosis not present

## 2016-01-04 DIAGNOSIS — I10 Essential (primary) hypertension: Secondary | ICD-10-CM | POA: Diagnosis not present

## 2016-01-04 DIAGNOSIS — I779 Disorder of arteries and arterioles, unspecified: Secondary | ICD-10-CM | POA: Diagnosis not present

## 2016-01-04 DIAGNOSIS — I701 Atherosclerosis of renal artery: Secondary | ICD-10-CM

## 2016-01-04 DIAGNOSIS — I6523 Occlusion and stenosis of bilateral carotid arteries: Secondary | ICD-10-CM

## 2016-01-04 MED ORDER — NITROGLYCERIN 0.4 MG SL SUBL: 0.4000 mg | SUBLINGUAL_TABLET | SUBLINGUAL | 4 refills | Status: DC | PRN

## 2016-01-04 MED ORDER — NITROGLYCERIN 0.4 MG SL SUBL: 0.4000 mg | SUBLINGUAL_TABLET | SUBLINGUAL | 3 refills | Status: DC | PRN

## 2016-01-04 MED ORDER — ROSUVASTATIN CALCIUM 10 MG PO TABS: 10.0000 mg | ORAL_TABLET | Freq: Every day | ORAL | 3 refills | Status: DC

## 2016-01-04 NOTE — Progress Notes (Signed)
Thx.  JJB 

## 2016-01-04 NOTE — Patient Instructions (Addendum)
Medication Instructions: Your physician has recommended you make the following change in your medication:   1. STOP Simvastatin (Zocor) 2. START Crestor 10 mg by mouth once daily 3. Start Nitroglycerin 0.4 mg, take as directed on the bottle.    Labwork: Fasting Lipid and Hepatic in 6 weeks (02/13/16)  Procedures/Testing: Your physician has requested that you have a lexiscan myoview. For further information please visit [email or web address]. Please follow instruction sheet, as given.    Follow-Up: Your physician recommends that you keep your scheduled follow up appointment with Dr. Alvester Chou  Any Additional Special Instructions Will Be Listed Below (If Applicable).  Pharmacologic Stress Electrocardiogram A pharmacologic stress electrocardiogram is a heart (cardiac) test that uses nuclear imaging to evaluate the blood supply to your heart. This test may also be called a pharmacologic stress electrocardiography. Pharmacologic means that a medicine is used to increase your heart rate and blood pressure.  This stress test is done to find areas of poor blood flow to the heart by determining the extent of coronary artery disease (CAD). Some people exercise on a treadmill, which naturally increases the blood flow to the heart. For those people unable to exercise on a treadmill, a medicine is used. This medicine stimulates your heart and will cause your heart to beat harder and more quickly, as if you were exercising.  Pharmacologic stress tests can help determine:  The adequacy of blood flow to your heart during increased levels of activity in order to clear you for discharge home.  The extent of coronary artery blockage caused by CAD.  Your prognosis if you have suffered a heart attack.  The effectiveness of cardiac procedures done, such as an angioplasty, which can increase the circulation in your coronary arteries.  Causes of chest pain or pressure. LET [facility] CARE PROVIDER KNOW  ABOUT:  Any allergies you have.  All medicines you are taking, including vitamins, herbs, eye drops, creams, and over-the-counter medicines.  Previous problems you or members of your family have had with the use of anesthetics.  Any blood disorders you have.  Previous surgeries you have had.  Medical conditions you have.  Possibility of pregnancy, if this applies.  If you are currently breastfeeding. RISKS AND COMPLICATIONS Generally, this is a safe procedure. However, as with any procedure, complications can occur. Possible complications include:  You develop pain or pressure in the following areas:  Chest.  Jaw or neck.  Between your shoulder blades.  Radiating down your left arm.  Headache.  Dizziness or light-headedness.  Shortness of breath.  Increased or irregular heartbeat.  Low blood pressure.  Nausea or vomiting.  Flushing.  Redness going up the arm and slight pain during injection of medicine.  Heart attack (rare). BEFORE THE PROCEDURE   Avoid all forms of caffeine for 24 hours before your test or as directed by your health care provider. This includes coffee, tea (even decaffeinated tea), caffeinated sodas, chocolate, cocoa, and certain pain medicines.  Follow your health care provider's instructions regarding eating and drinking before the test.  Take your medicines as directed at regular times with water unless instructed otherwise. Exceptions may include:  If you have diabetes, ask how you are to take your insulin or pills. It is common to adjust insulin dosing the morning of the test.  If you are taking beta-blocker medicines, it is important to talk to your health care provider about these medicines well before the date of your test. Taking beta-blocker medicines may interfere with the test.  In some cases, these medicines need to be changed or stopped 24 hours or more before the test.  If you wear a nitroglycerin patch, it may need to be  removed prior to the test. Ask your health care provider if the patch should be removed before the test.  If you use an inhaler for any breathing condition, bring it with you to the test.  If you are an outpatient, bring a snack so you can eat right after the stress phase of the test.  Do not smoke for 4 hours prior to the test or as directed by your health care provider.  Do not apply lotions, powders, creams, or oils on your chest prior to the test.  Wear comfortable shoes and clothing. Let your health care provider know if you were unable to complete or follow the preparations for your test. PROCEDURE   Multiple patches (electrodes) will be put on your chest. If needed, small areas of your chest may be shaved to get better contact with the electrodes. Once the electrodes are attached to your body, multiple wires will be attached to the electrodes, and your heart rate will be monitored.  An IV access will be started. A nuclear trace (isotope) is given. The isotope may be given intravenously, or it may be swallowed. Nuclear refers to several types of radioactive isotopes, and the nuclear isotope lights up the arteries so that the nuclear images are clear. The isotope is absorbed by your body. This results in low radiation exposure.  A resting nuclear image is taken to show how your heart functions at rest.  A medicine is given through the IV access.  A second scan is done about 1 hour after the medicine injection and determines how your heart functions under stress.  During this stress phase, you will be connected to an electrocardiogram machine. Your blood pressure and oxygen levels will be monitored. AFTER THE PROCEDURE   Your heart rate and blood pressure will be monitored after the test.  You may return to your normal schedule, including diet,activities, and medicines, unless your health care provider tells you otherwise.   This information is not intended to replace advice given  to you by your health care provider. Make sure you discuss any questions you have with your health care provider.   Document Released: 09/30/2008 Document Revised: 05/19/2013 Document Reviewed: 01/19/2013 Elsevier Interactive Patient Education [employer].      If you need a refill on your cardiac medications before your next appointment, please call your pharmacy.

## 2016-01-05 ENCOUNTER — Telehealth (HOSPITAL_COMMUNITY): Payer: Self-pay | Admitting: *Deleted

## 2016-01-05 NOTE — Telephone Encounter (Signed)
Patient given detailed instructions per Myocardial Perfusion Study Information Sheet for the test on 01/09/16. Patient notified to arrive 15 minutes early and that it is imperative to arrive on time for appointment to keep from having the test rescheduled.  If you need to cancel or reschedule your appointment, please call the office within 24 hours of your appointment. Failure to do so may result in a cancellation of your appointment, and a $50 no show fee. Patient verbalized understanding. Hubbard Robinson, RN

## 2016-01-09 ENCOUNTER — Ambulatory Visit (HOSPITAL_COMMUNITY): Payer: PPO | Attending: Cardiovascular Disease

## 2016-01-09 ENCOUNTER — Telehealth: Payer: Self-pay | Admitting: Cardiovascular Disease

## 2016-01-09 DIAGNOSIS — R0789 Other chest pain: Secondary | ICD-10-CM | POA: Insufficient documentation

## 2016-01-09 DIAGNOSIS — R0609 Other forms of dyspnea: Secondary | ICD-10-CM | POA: Diagnosis not present

## 2016-01-09 DIAGNOSIS — R9439 Abnormal result of other cardiovascular function study: Secondary | ICD-10-CM | POA: Diagnosis not present

## 2016-01-09 DIAGNOSIS — I209 Angina pectoris, unspecified: Secondary | ICD-10-CM | POA: Diagnosis not present

## 2016-01-09 DIAGNOSIS — R55 Syncope and collapse: Secondary | ICD-10-CM | POA: Diagnosis not present

## 2016-01-09 DIAGNOSIS — I779 Disorder of arteries and arterioles, unspecified: Secondary | ICD-10-CM | POA: Diagnosis not present

## 2016-01-09 DIAGNOSIS — I10 Essential (primary) hypertension: Secondary | ICD-10-CM | POA: Insufficient documentation

## 2016-01-09 LAB — MYOCARDIAL PERFUSION IMAGING
CHL CUP NUCLEAR SDS: 6
LHR: 0.33
LV dias vol: 134 mL (ref 62–150)
LVSYSVOL: 64 mL
Peak HR: 71 {beats}/min
Rest HR: 51 {beats}/min
SRS: 5
SSS: 11
TID: 0.97

## 2016-01-09 MED ORDER — TECHNETIUM TC 99M TETROFOSMIN IV KIT
32.5000 | PACK | Freq: Once | INTRAVENOUS | Status: AC | PRN
  Administered 2016-01-09: 33 via INTRAVENOUS
  Filled 2016-01-09: qty 33

## 2016-01-09 MED ORDER — TECHNETIUM TC 99M TETROFOSMIN IV KIT
10.2000 | PACK | Freq: Once | INTRAVENOUS | Status: AC | PRN
  Administered 2016-01-09: 10 via INTRAVENOUS
  Filled 2016-01-09: qty 10

## 2016-01-09 MED ORDER — REGADENOSON 0.4 MG/5ML IV SOLN
0.4000 mg | Freq: Once | INTRAVENOUS | Status: AC
  Administered 2016-01-09: 0.4 mg via INTRAVENOUS

## 2016-01-09 NOTE — Telephone Encounter (Signed)
New message     Calling regarding the Crestor 10mg  and wants to know if it is replacing the Simvastatin. Please call.

## 2016-01-09 NOTE — Telephone Encounter (Signed)
Left detailed message for Dillon Young, crestor 10 mg does replace simvastatin.

## 2016-01-10 ENCOUNTER — Encounter: Payer: Self-pay | Admitting: Internal Medicine

## 2016-01-10 ENCOUNTER — Ambulatory Visit (INDEPENDENT_AMBULATORY_CARE_PROVIDER_SITE_OTHER): Payer: PPO | Admitting: Internal Medicine

## 2016-01-10 ENCOUNTER — Telehealth: Payer: Self-pay | Admitting: Cardiovascular Disease

## 2016-01-10 VITALS — BP 172/68 | HR 56 | Temp 98.1°F | Resp 16 | Ht 69.0 in | Wt 194.2 lb

## 2016-01-10 DIAGNOSIS — I25119 Atherosclerotic heart disease of native coronary artery with unspecified angina pectoris: Secondary | ICD-10-CM | POA: Diagnosis not present

## 2016-01-10 DIAGNOSIS — H6120 Impacted cerumen, unspecified ear: Secondary | ICD-10-CM | POA: Insufficient documentation

## 2016-01-10 DIAGNOSIS — I499 Cardiac arrhythmia, unspecified: Secondary | ICD-10-CM | POA: Diagnosis not present

## 2016-01-10 DIAGNOSIS — H6123 Impacted cerumen, bilateral: Secondary | ICD-10-CM | POA: Diagnosis not present

## 2016-01-10 MED ORDER — FLUTICASONE PROPIONATE 50 MCG/ACT NA SUSP: 2.0000 | NASAL | 3 refills | Status: DC | PRN

## 2016-01-10 NOTE — Progress Notes (Signed)
Subjective:  Patient ID: Dillon Young, male    DOB: Jun 14, 1932  Age: 82 y.o. MRN: [id number]  CC: Hypertension   HPI Dillon Young presents for concerns about hearing loss, he saw an audiologist recently and he was told to have the wax removed from his ears. He had a Lexiscan done 1 day prior to this visit for ongoing chest tightness.  Outpatient Medications Prior to Visit  Medication Sig Dispense Refill  . amLODipine (NORVASC) 5 MG tablet Take 1 tablet (5 mg total) by mouth daily. 90 tablet 3  . aspirin EC 81 MG tablet Take 81 mg by mouth every evening.     . cholecalciferol (VITAMIN D) 1000 UNITS tablet Take 1,000 Units by mouth daily.      Dillon Young Kitchen dicyclomine (BENTYL) 20 MG tablet Take 1 tablet (20 mg total) by mouth every 6 (six) hours as needed for spasms. 30 tablet 5  . Doxepin HCl (SILENOR) 3 MG TABS Take 1 tablet (3 mg total) by mouth at bedtime as needed. 30 tablet 11  . hydrocortisone (ANUSOL-HC) 2.5 % rectal cream Place 1 application rectally 2 (two) times daily. Reported on 12/15/2015. Pt uses PRN    . irbesartan-hydrochlorothiazide (AVALIDE) 300-12.5 MG tablet Take 1 tablet by mouth daily. 90 tablet 3  . Melatonin 5 MG CAPS Take 1 capsule (5 mg total) by mouth at bedtime. 1 capsule 0  . metoprolol succinate (TOPROL-XL) 25 MG 24 hr tablet Take 1 tablet (25 mg total) by mouth every evening. Take with or immediately following a meal. 90 tablet 3  . polyethylene glycol powder (GLYCOLAX/MIRALAX) powder Take 17 g by mouth as needed. 255 g 3  . rosuvastatin (CRESTOR) 10 MG tablet Take 1 tablet (10 mg total) by mouth daily. 90 tablet 3  . vitamin C (ASCORBIC ACID) 500 MG tablet Take 500 mg by mouth daily.     . Wheat Dextrin (BENEFIBER) POWD 2 tablespoons a day    . fluticasone (FLONASE) 50 MCG/ACT nasal spray Place 2 sprays into the nose as needed.    . nitroGLYCERIN (NITROSTAT) 0.4 MG SL tablet Place 1 tablet (0.4 mg total) under the tongue every 5 (five) minutes as needed for chest  pain. (Patient not taking: Reported on 01/10/2016) 25 tablet 3   No facility-administered medications prior to visit.     ROS Review of Systems  Constitutional: Negative.  Negative for diaphoresis and fatigue.  HENT: Positive for ear pain and hearing loss. Negative for ear discharge.   Eyes: Negative.   Respiratory: Positive for chest tightness. Negative for cough, choking, shortness of breath and stridor.        He has had intermittent chest tightness with exertion for about a month  Cardiovascular: Negative.  Negative for chest pain, palpitations and leg swelling.  Gastrointestinal: Negative.  Negative for abdominal pain, diarrhea, nausea and vomiting.  Endocrine: Negative.   Genitourinary: Negative.   Musculoskeletal: Negative.  Negative for back pain and neck pain.  Skin: Negative.   Allergic/Immunologic: Negative.   Neurological: Negative.  Negative for dizziness, weakness and light-headedness.  Hematological: Negative.  Negative for adenopathy. Does not bruise/bleed easily.  Psychiatric/Behavioral: Negative.     Objective:  BP (!) 172/68 (BP Location: Left Arm, Patient Position: Sitting, Cuff Size: Normal)   Pulse (!) 56   Temp 98.1 F (36.7 C) (Oral)   Resp 16   Ht 5\' 9"  (1.753 m)   Wt 194 lb 4 oz (88.1 kg)   SpO2 96%  BMI 28.69 kg/m   BP Readings from Last 3 Encounters:  01/10/16 (!) 172/68  01/04/16 (!) 150/70  12/15/15 (!) 170/58    Wt Readings from Last 3 Encounters:  01/10/16 194 lb 4 oz (88.1 kg)  01/09/16 194 lb (88 kg)  01/04/16 194 lb (88 kg)    Physical Exam  Constitutional: He is oriented to person, place, and time. No distress.  HENT:  Right Ear: Tympanic membrane and external ear normal. No drainage, swelling or tenderness. A foreign body (cerumen impaction) is present. No middle ear effusion. Decreased hearing is noted.  Left Ear: Tympanic membrane and external ear normal. No drainage, swelling or tenderness. A foreign body (cerumen impaction)  is present.  No middle ear effusion. Decreased hearing is noted.  Mouth/Throat: Oropharynx is clear and moist. No oropharyngeal exudate.  I put Colace in both of his ears and then irrigated them and used an ear pick to remove the wax. After this was done his ear exams are normal. He tells me that the discomfort and decreased hearing has improved. He tolerated this well.  Eyes: Conjunctivae are normal. Right eye exhibits no discharge. Left eye exhibits no discharge. No scleral icterus.  Neck: Normal range of motion. Neck supple. No JVD present. No tracheal deviation present. No thyromegaly present.  Cardiovascular: Normal rate, regular rhythm, S1 normal, S2 normal, normal heart sounds and intact distal pulses.   Occasional extrasystoles are present. Exam reveals no gallop and no friction rub.   No murmur heard. EKG ----  Sinus  Rhythm  -Frequent pvcs -ventricular bigeminy  Low voltage -possible pulmonary disease.   ABNORMAL   Pulmonary/Chest: Effort normal and breath sounds normal. No stridor. No respiratory distress. He has no wheezes. He has no rales. He exhibits no tenderness.  Abdominal: Soft. Bowel sounds are normal. He exhibits no distension. There is no tenderness. There is no rebound and no guarding.  Musculoskeletal: Normal range of motion. He exhibits no edema, tenderness or deformity.  Lymphadenopathy:    He has no cervical adenopathy.  Neurological: He is oriented to person, place, and time.  Skin: Skin is warm and dry. No rash noted. He is not diaphoretic. No erythema. No pallor.    Lab Results  Component Value Date   WBC 6.5 07/05/2014   HGB 14.3 07/05/2014   HCT 42.4 07/05/2014   PLT 240.0 07/05/2014   GLUCOSE 126 (H) 12/15/2015   CHOL 129 07/19/2015   TRIG 128 07/19/2015   HDL 41 07/19/2015   LDLCALC 62 07/19/2015   ALT 17 07/19/2015   AST 18 07/19/2015   NA 142 12/15/2015   K 4.4 12/15/2015   CL 103 12/15/2015   CREATININE 1.34 12/15/2015   BUN 26 (H) 12/15/2015    CO2 32 12/15/2015   TSH 1.63 12/15/2015   PSA 0.01 (L) 07/05/2014   INR 1.04 11/07/2011   HGBA1C 6.5 12/15/2015   MICROALBUR 6.1 (H) 08/15/2015    01/09/16  The left ventricular ejection fraction is mildly decreased (45-54%).  Nuclear stress EF: 52%.  There was no ST segment deviation noted during stress.  Findings consistent with ischemia.  This is an intermediate risk study.   1. EF 52% with basal inferolateral hypokinesis.  2. Primarily reversible medium sized, moderate-intensity basal inferolateral and basal to mid inferior perfusion defect.  This is concerning for ischemia.   No results found.  Assessment & Plan:   Malhar was seen today for hypertension.  Diagnoses and all orders for this visit:  Cardiac arrhythmia, unspecified cardiac arrhythmia type- he has frequent PVCs on his EKG today, this does not appear to be a dangerous dysrhythmia and he is tolerating it well, he will follow up serum with his cardiologist. -     EKG 12-Lead  Atherosclerosis of native coronary artery of native heart with angina pectoris ([place])- His thallium scan done one day prior to this visit was suspicious for ischemia and he has ongoing chest tightness, I've asked him to call his cardiologist today for further recommendations.  Other orders -     fluticasone (FLONASE) 50 MCG/ACT nasal spray; Place 2 sprays into both nostrils as needed.   I have changed Mr. Krizek's fluticasone. I am also having him maintain his vitamin C, cholecalciferol, aspirin EC, Melatonin, dicyclomine, amLODipine, Doxepin HCl, irbesartan-hydrochlorothiazide, metoprolol succinate, BENEFIBER, polyethylene glycol powder, hydrocortisone, rosuvastatin, and nitroGLYCERIN.  Meds ordered this encounter  Medications  . fluticasone (FLONASE) 50 MCG/ACT nasal spray    Sig: Place 2 sprays into both nostrils as needed.    Dispense:  16 g    Refill:  3     Follow-up: Return in about 3 months (around  04/11/2016).  [name], MD

## 2016-01-10 NOTE — Progress Notes (Signed)
Pre visit review using our clinic review tool, if applicable. No additional management support is needed unless otherwise documented below in the visit note. 

## 2016-01-10 NOTE — Telephone Encounter (Signed)
New Message  Pt expressed he had a Dr's appt and they informed him of his diagnosis from his appt with [place] and to contact us.  Pt expressed he's surprised he hasn't heard from MD [name] as of yet.  Pt was not upset, but would like for someone to contact him back.  Please follow up with pt. Thanks!

## 2016-01-10 NOTE — Telephone Encounter (Signed)
Pt called today bc he had not heard results of his stress test performed yesterday. He saw his Internal Med physician who read study for him & was concerned w/ findings.  Pt needs interpretation from cardiology and advice on next steps.  He is aware I will route to provider for review.

## 2016-01-10 NOTE — Assessment & Plan Note (Signed)
Successful removal of cerumen today

## 2016-01-10 NOTE — Patient Instructions (Signed)
Hypertension Hypertension, commonly called high blood pressure, is when the force of blood pumping through your arteries is too strong. Your arteries are the blood vessels that carry blood from your heart throughout your body. A blood pressure reading consists of a higher number over a lower number, such as 110/72. The higher number (systolic) is the pressure inside your arteries when your heart pumps. The lower number (diastolic) is the pressure inside your arteries when your heart relaxes. Ideally you want your blood pressure below 120/80. Hypertension forces your heart to work harder to pump blood. Your arteries may become narrow or stiff. Having untreated or uncontrolled hypertension can cause heart attack, stroke, kidney disease, and other problems. RISK FACTORS Some risk factors for high blood pressure are controllable. Others are not.  Risk factors you cannot control include:   Race. You may be at higher risk if you are African American.  Age. Risk increases with age.  Gender. Men are at higher risk than women before age 45 years. After age 65, women are at higher risk than men. Risk factors you can control include:  Not getting enough exercise or physical activity.  Being overweight.  Getting too much fat, sugar, calories, or salt in your diet.  Drinking too much alcohol. SIGNS AND SYMPTOMS Hypertension does not usually cause signs or symptoms. Extremely high blood pressure (hypertensive crisis) may cause headache, anxiety, shortness of breath, and nosebleed. DIAGNOSIS To check if you have hypertension, your health care provider will measure your blood pressure while you are seated, with your arm held at the level of your heart. It should be measured at least twice using the same arm. Certain conditions can cause a difference in blood pressure between your right and left arms. A blood pressure reading that is higher than normal on one occasion does not mean that you need treatment. If  it is not clear whether you have high blood pressure, you may be asked to return on a different day to have your blood pressure checked again. Or, you may be asked to monitor your blood pressure at home for 1 or more weeks. TREATMENT Treating high blood pressure includes making lifestyle changes and possibly taking medicine. Living a healthy lifestyle can help lower high blood pressure. You may need to change some of your habits. Lifestyle changes may include:  Following the DASH diet. This diet is high in fruits, vegetables, and whole grains. It is low in salt, red meat, and added sugars.  Keep your sodium intake below 2,300 mg per day.  Getting at least 30-45 minutes of aerobic exercise at least 4 times per week.  Losing weight if necessary.  Not smoking.  Limiting alcoholic beverages.  Learning ways to reduce stress. Your health care provider may prescribe medicine if lifestyle changes are not enough to get your blood pressure under control, and if one of the following is true:  You are 18-59 years of age and your systolic blood pressure is above 140.  You are 60 years of age or older, and your systolic blood pressure is above 150.  Your diastolic blood pressure is above 90.  You have diabetes, and your systolic blood pressure is over 140 or your diastolic blood pressure is over 90.  You have kidney disease and your blood pressure is above 140/90.  You have heart disease and your blood pressure is above 140/90. Your personal target blood pressure may vary depending on your medical conditions, your age, and other factors. HOME CARE INSTRUCTIONS    Have your blood pressure rechecked as directed by your health care provider.   Take medicines only as directed by your health care provider. Follow the directions carefully. Blood pressure medicines must be taken as prescribed. The medicine does not work as well when you skip doses. Skipping doses also puts you at risk for  problems.  Do not smoke.   Monitor your blood pressure at home as directed by your health care provider. SEEK MEDICAL CARE IF:   You think you are having a reaction to medicines taken.  You have recurrent headaches or feel dizzy.  You have swelling in your ankles.  You have trouble with your vision. SEEK IMMEDIATE MEDICAL CARE IF:  You develop a severe headache or confusion.  You have unusual weakness, numbness, or feel faint.  You have severe chest or abdominal pain.  You vomit repeatedly.  You have trouble breathing. MAKE SURE YOU:   Understand these instructions.  Will watch your condition.  Will get help right away if you are not doing well or get worse.   This information is not intended to replace advice given to you by your health care provider. Make sure you discuss any questions you have with your health care provider.   Document Released: 05/14/2005 Document Revised: 09/28/2014 Document Reviewed: 03/06/2013 Elsevier Interactive Patient Education 2016 [employer]  

## 2016-01-11 ENCOUNTER — Encounter: Payer: Self-pay | Admitting: Cardiology

## 2016-01-11 NOTE — Telephone Encounter (Signed)
Abnormal stress test, needs return office visit in the near future

## 2016-01-11 NOTE — Telephone Encounter (Signed)
F/u     Pt would like nurse to call him back concerning his test result. Please call.

## 2016-01-11 NOTE — Telephone Encounter (Signed)
Returned call to patient.  Gave him Dr Kennon Holter recommendation and that return office visit is scheduled for 01/18/16. He said he sometimes as chest pain, he takes his nitroglycerin and it goes away. Pt verbalized understanding to go to the emergency room or call 911 if the chest pain develops and worsens and he develops other symptoms, such as SOB, nausea, vomiting, or sweating.  He verbalized understanding.

## 2016-01-14 ENCOUNTER — Other Ambulatory Visit: Payer: Self-pay

## 2016-01-14 ENCOUNTER — Inpatient Hospital Stay (HOSPITAL_COMMUNITY)
Admission: EM | Admit: 2016-01-14 | Discharge: 2016-01-18 | DRG: 247 | Disposition: A | Discharge status: Home / Self Care | Payer: PPO | Attending: Internal Medicine | Admitting: Internal Medicine

## 2016-01-14 ENCOUNTER — Encounter (HOSPITAL_COMMUNITY): Payer: Self-pay

## 2016-01-14 DIAGNOSIS — I10 Essential (primary) hypertension: Secondary | ICD-10-CM | POA: Diagnosis not present

## 2016-01-14 DIAGNOSIS — I200 Unstable angina: Secondary | ICD-10-CM | POA: Diagnosis not present

## 2016-01-14 DIAGNOSIS — Z9841 Cataract extraction status, right eye: Secondary | ICD-10-CM

## 2016-01-14 DIAGNOSIS — I272 Other secondary pulmonary hypertension: Secondary | ICD-10-CM | POA: Diagnosis present

## 2016-01-14 DIAGNOSIS — Z8546 Personal history of malignant neoplasm of prostate: Secondary | ICD-10-CM

## 2016-01-14 DIAGNOSIS — R079 Chest pain, unspecified: Secondary | ICD-10-CM | POA: Diagnosis not present

## 2016-01-14 DIAGNOSIS — I129 Hypertensive chronic kidney disease with stage 1 through stage 4 chronic kidney disease, or unspecified chronic kidney disease: Secondary | ICD-10-CM | POA: Diagnosis not present

## 2016-01-14 DIAGNOSIS — R7989 Other specified abnormal findings of blood chemistry: Secondary | ICD-10-CM | POA: Diagnosis not present

## 2016-01-14 DIAGNOSIS — Z79899 Other long term (current) drug therapy: Secondary | ICD-10-CM | POA: Diagnosis not present

## 2016-01-14 DIAGNOSIS — N400 Enlarged prostate without lower urinary tract symptoms: Secondary | ICD-10-CM | POA: Diagnosis not present

## 2016-01-14 DIAGNOSIS — Z9861 Coronary angioplasty status: Secondary | ICD-10-CM

## 2016-01-14 DIAGNOSIS — E785 Hyperlipidemia, unspecified: Secondary | ICD-10-CM | POA: Diagnosis not present

## 2016-01-14 DIAGNOSIS — Z87891 Personal history of nicotine dependence: Secondary | ICD-10-CM | POA: Diagnosis not present

## 2016-01-14 DIAGNOSIS — Z9842 Cataract extraction status, left eye: Secondary | ICD-10-CM

## 2016-01-14 DIAGNOSIS — I2510 Atherosclerotic heart disease of native coronary artery without angina pectoris: Secondary | ICD-10-CM

## 2016-01-14 DIAGNOSIS — R778 Other specified abnormalities of plasma proteins: Secondary | ICD-10-CM | POA: Diagnosis not present

## 2016-01-14 DIAGNOSIS — E1151 Type 2 diabetes mellitus with diabetic peripheral angiopathy without gangrene: Secondary | ICD-10-CM | POA: Diagnosis present

## 2016-01-14 DIAGNOSIS — I25110 Atherosclerotic heart disease of native coronary artery with unstable angina pectoris: Principal | ICD-10-CM | POA: Diagnosis present

## 2016-01-14 DIAGNOSIS — K219 Gastro-esophageal reflux disease without esophagitis: Secondary | ICD-10-CM | POA: Diagnosis not present

## 2016-01-14 DIAGNOSIS — R001 Bradycardia, unspecified: Secondary | ICD-10-CM | POA: Diagnosis present

## 2016-01-14 DIAGNOSIS — N189 Chronic kidney disease, unspecified: Secondary | ICD-10-CM | POA: Diagnosis not present

## 2016-01-14 DIAGNOSIS — Z961 Presence of intraocular lens: Secondary | ICD-10-CM | POA: Diagnosis not present

## 2016-01-14 DIAGNOSIS — E119 Type 2 diabetes mellitus without complications: Secondary | ICD-10-CM

## 2016-01-14 DIAGNOSIS — Z8673 Personal history of transient ischemic attack (TIA), and cerebral infarction without residual deficits: Secondary | ICD-10-CM | POA: Diagnosis not present

## 2016-01-14 DIAGNOSIS — E1122 Type 2 diabetes mellitus with diabetic chronic kidney disease: Secondary | ICD-10-CM | POA: Diagnosis present

## 2016-01-14 DIAGNOSIS — G4700 Insomnia, unspecified: Secondary | ICD-10-CM

## 2016-01-14 DIAGNOSIS — G4730 Sleep apnea, unspecified: Secondary | ICD-10-CM | POA: Diagnosis not present

## 2016-01-14 MED ORDER — NITROGLYCERIN IN D5W 200-5 MCG/ML-% IV SOLN
10.0000 ug/min | INTRAVENOUS | Status: DC
  Administered 2016-01-15: 10 ug/min via INTRAVENOUS
  Filled 2016-01-14: qty 250

## 2016-01-14 NOTE — ED Provider Notes (Signed)
[facility] DEPT Provider Note   CSN: [id number] Arrival date & time: 01/14/16  2305  By signing my name below, I, Dillon Young, attest that this documentation has been prepared under the direction and in the presence of Dillon Fuel, MD. Electronically Signed: Reola Young, ED Scribe. 01/14/16. 11:59 PM.  History   Chief Complaint Chief Complaint  Patient presents with  . Chest Pain   The history is provided by the patient and medical records. No language interpreter was used.   HPI Comments: SHOWN Dillon Young is a 82 y.o. male BIB EMS, with a PMHx significant of HTN, CKD, CAD s/p stent placement, DM, CVA, GERD, and HLD, who presents to the Emergency Department complaining of sudden onset, intermittent chest tightness x ~1 month, worsening tonight PTA. Pt reports that he was digging on his land one month ago, and at that time he had a sudden onset of chest tightness, w/ associated SOB, diaphoresis, and notes his vision was blurred for a few minutes. His symptoms at that time resolved with rest, but he notes that he has had several episodes since. Pt states that they were intially brought on with exertion. However, he notes that the episodes are now brought on without exertion, and his episode tonight began while he was sitting down watching TV. He reports that his episodes last approximately 20 minutes each time. He rates his current pain as 5/10, and gradually worsening. His pain does not wake him up from his sleep. Pt has been taking Nitroglycerin with complete relief of his pain over the past month, but notes that he took two dosages of Nitroglycerin and four 81mg  Asprin tonight with minimal relief of his pain. Prior chart review shows that he was seen by his Cardiologist ~1.5 weeks ago and had a stress test performed that was normal. Pt does not currently smoke, but has a hx of it. He states that he quit smoking ~5 years ago. Denies nausea, vomiting, or any other associated  symptoms.   Cardiologist: Dillon Linsey, MD  Past Medical History:  Diagnosis Date  . Adenomatous colon polyp   . Allergic rhinitis   . BPH (benign prostatic hyperplasia)   . CAD (coronary artery disease)    sees Dr. [name]   . Carotid artery disease ([place])   . Chronic kidney disease    Sones. Prostate cancer  . CVA (cerebral infarction)   . Diabetes mellitus    "Borderline"  . Diverticulitis   . GERD (gastroesophageal reflux disease)    history  . Hemorrhoids   . HTN (hypertension)   . Hyperlipidemia    echo 10-25-2010 mild abnormalities  . IBS (irritable bowel syndrome)   . Low back pain   . Prostate cancer ([place]) 2008   seed implantation  . Sleep apnea    last study was aborted.  Has never worn a CPAP.    Marland Kitchen Smoker    Patient Active Problem List   Diagnosis Date Noted  . Cerumen impaction 01/10/2016  . Arrhythmia 01/10/2016  . Bradycardia 12/15/2015  . Routine general medical examination at a health care facility 08/16/2015  . Middle insomnia 08/15/2015  . Occlusion and stenosis of carotid artery without mention of cerebral infarction 11/01/2011  . PVC's (premature ventricular contractions) 05/07/2011  . Radiation proctitis 03/30/2011  . Hemorrhoids, internal, with bleeding, prolapse and itching 02/02/2011  . Constipation, chronic 02/02/2011  . Coronary atherosclerosis 09/08/2008  . Cerebrovascular disease 09/08/2008  . ALLERGIC RHINITIS 09/08/2008  . Diabetes mellitus  without complication ([place]) [id number]  . HISTORY OF ADENOCARCINOMA, PROSTATE 09/06/2008    Class: History of  . Hyperlipidemia with target LDL less than 70 07/02/2008  . Essential hypertension 07/02/2008  . GERD 07/02/2008  . Irritable bowel syndrome 07/02/2008  . LOW BACK PAIN SYNDROME 07/02/2008   Past Surgical History:  Procedure Laterality Date  . APPENDECTOMY    . Cataract with Lens     Bilateral  . COLONOSCOPY  10/21/2008   diverticulosis, radiation proctitis, internal  hemorrhoids  . ENDARTERECTOMY  11/12/2011   Procedure: ENDARTERECTOMY CAROTID;  Surgeon: Dillon Posner, MD;  Location: [facility] OR;  Service: Vascular;  Laterality: Right;  . Esophageal dilitation    . HEMORRHOID BANDING    . Lithrotripsy      x 3  . LUMBAR LAMINECTOMY    . TRANSURETHRAL RESECTION OF PROSTATE  10/2003   Home Medications    Prior to Admission medications   Medication Sig Start Date End Date Taking? Authorizing Provider  amLODipine (NORVASC) 5 MG tablet Take 1 tablet (5 mg total) by mouth daily. 08/02/15   [name], MD  aspirin EC 81 MG tablet Take 81 mg by mouth every evening.     Historical Provider, MD  cholecalciferol (VITAMIN D) 1000 UNITS tablet Take 1,000 Units by mouth daily.      Historical Provider, MD  dicyclomine (BENTYL) 20 MG tablet Take 1 tablet (20 mg total) by mouth every 6 (six) hours as needed for spasms. 01/24/15   Gatha Mayer, MD  Doxepin HCl (SILENOR) 3 MG TABS Take 1 tablet (3 mg total) by mouth at bedtime as needed. 08/15/15   [name], MD  fluticasone (FLONASE) 50 MCG/ACT nasal spray Place 2 sprays into both nostrils as needed. 01/10/16   [name], MD  hydrocortisone (ANUSOL-HC) 2.5 % rectal cream Place 1 application rectally 2 (two) times daily. Reported on 12/15/2015. Pt uses PRN    Historical Provider, MD  irbesartan-hydrochlorothiazide (AVALIDE) 300-12.5 MG tablet Take 1 tablet by mouth daily. 09/02/15   [name], MD  Melatonin 5 MG CAPS Take 1 capsule (5 mg total) by mouth at bedtime. 07/05/14   Laurey Morale, MD  metoprolol succinate (TOPROL-XL) 25 MG 24 hr tablet Take 1 tablet (25 mg total) by mouth every evening. Take with or immediately following a meal. 09/02/15   [name], MD  nitroGLYCERIN (NITROSTAT) 0.4 MG SL tablet Place 1 tablet (0.4 mg total) under the tongue every 5 (five) minutes as needed for chest pain. Patient not taking: Reported on 01/10/2016 01/04/16   Isaiah Serge, NP  polyethylene glycol powder  (GLYCOLAX/MIRALAX) powder Take 17 g by mouth as needed. 10/07/15   Gatha Mayer, MD  rosuvastatin (CRESTOR) 10 MG tablet Take 1 tablet (10 mg total) by mouth daily. 01/04/16 04/03/16  Isaiah Serge, NP  vitamin C (ASCORBIC ACID) 500 MG tablet Take 500 mg by mouth daily.     Historical Provider, MD  Wheat Dextrin (BENEFIBER) POWD 2 tablespoons a day 10/07/15   Gatha Mayer, MD   Family History Family History  Problem Relation Age of Onset  . Heart disease Father    Social History Social History  Substance Use Topics  . Smoking status: Former Smoker    Packs/day: 1.50    Years: 60.00    Types: Cigarettes    Quit date: 07/25/2008  . Smokeless tobacco: Never Used  . Alcohol use 0.0 oz/week  Comment: Occ   Allergies   Lipitor [atorvastatin]  Review of Systems Review of Systems  Constitutional: Positive for diaphoresis.  Eyes: Positive for visual disturbance.  Respiratory: Positive for chest tightness and shortness of breath.   Gastrointestinal: Negative for nausea and vomiting.  Psychiatric/Behavioral: Negative for sleep disturbance.  All other systems reviewed and are negative.  Physical Exam Updated Vital Signs BP 146/56   Pulse (!) 56   Temp 97.7 F (36.5 C) (Oral)   Resp 16   SpO2 94%   Physical Exam  Constitutional: He is oriented to person, place, and time. He appears well-developed and well-nourished.  HENT:  Head: Normocephalic and atraumatic.  Eyes: EOM are normal. Pupils are equal, round, and reactive to light.  Neck: Normal range of motion. Neck supple. No JVD present.  Cardiovascular: Normal rate, regular rhythm and normal heart sounds.   No murmur heard. Pulmonary/Chest: Effort normal and breath sounds normal. He has no wheezes. He has no rales. He exhibits no tenderness.  Abdominal: Soft. Bowel sounds are normal. He exhibits no distension and no mass. There is no tenderness.  Musculoskeletal: Normal range of motion. He exhibits no edema.    Lymphadenopathy:    He has no cervical adenopathy.  Neurological: He is alert and oriented to person, place, and time. No cranial nerve deficit. He exhibits normal muscle tone. Coordination normal.  Skin: Skin is warm and dry. No rash noted.  Psychiatric: He has a normal mood and affect. His behavior is normal. Judgment and thought content normal.  Nursing note and vitals reviewed.  ED Treatments / Results  DIAGNOSTIC STUDIES: Oxygen Saturation is 94% on room air, normal by my interpretation.   COORDINATION OF CARE: 11:57 PM-Discussed next steps with pt. Pt verbalized understanding and is agreeable with the plan.   Labs (all labs ordered are listed, but only abnormal results are displayed) Labs Reviewed  BASIC METABOLIC PANEL - Abnormal; Notable for the following:       Result Value   Glucose, Bld 133 (*)    BUN 23 (*)    GFR calc non Af Amer 54 (*)    All other components within normal limits  TROPONIN I  CBC WITH DIFFERENTIAL/PLATELET  HEPARIN LEVEL (UNFRACTIONATED)   EKG  EKG Interpretation  Date/Time:  Saturday January 14 2016 23:18:37 EDT Ventricular Rate:  59 PR Interval:    QRS Duration: 108 QT Interval:  453 QTC Calculation: 449 R Axis:   -39 Text Interpretation:  Sinus rhythm Ventricular trigeminy Prolonged PR interval Left axis deviation Low voltage QRS When compared with ECG of 05/24/2013, HEART RATE has decreased Premature ventricular complexes are now Present Confirmed by Roxanne Mins  MD, Qamar Rosman ([id number]) on 01/14/2016 11:23:54 PM      Radiology Dg Chest Port 1 View  Result Date: 01/15/2016 CLINICAL DATA:  Acute onset of generalized chest pain. Initial encounter. EXAM: PORTABLE CHEST 1 VIEW COMPARISON:  Chest radiograph performed 02/18/2012 FINDINGS: The lungs are well-aerated and clear. There is no evidence of focal opacification, pleural effusion or pneumothorax. The cardiomediastinal silhouette is mildly enlarged. No acute osseous abnormalities are seen.  IMPRESSION: Mild cardiomegaly.  Lungs remain grossly clear. Electronically Signed   By: Garald Balding M.D.   On: 01/15/2016 00:39    Procedures Procedures  CRITICAL CARE Performed by: [id number] Total critical care time: 35 minutes Critical care time was exclusive of separately billable procedures and treating other patients. Critical care was necessary to treat or prevent imminent or life-threatening deterioration. Critical care  was time spent personally by me on the following activities: development of treatment plan with patient and/or surrogate as well as nursing, discussions with consultants, evaluation of patient's response to treatment, examination of patient, obtaining history from patient or surrogate, ordering and performing treatments and interventions, ordering and review of laboratory studies, ordering and review of radiographic studies, pulse oximetry and re-evaluation of patient's condition.  Medications Ordered in ED Medications  nitroGLYCERIN 50 mg in dextrose 5 % 250 mL (0.2 mg/mL) infusion (10 mcg/min Intravenous New Bag/Given 01/15/16 0026)  heparin ADULT infusion 100 units/mL (25000 units/24mL sodium chloride 0.45%) (1,000 Units/hr Intravenous New Bag/Given 01/15/16 0031)  heparin bolus via infusion 4,000 Units (4,000 Units Intravenous Bolus from Bag 01/15/16 0032)    Initial Impression / Assessment and Plan / ED Course  I have reviewed the triage vital signs and the nursing notes.  Pertinent labs & imaging results that were available during my care of the patient were reviewed by me and considered in my medical decision making (see chart for details).  Clinical Course   Chest discomfort in pattern of unstable angina. It is worrisome that he has had one nocturnal episode and had another episode while I was talking to him for the exam. Old records are reviewed, and he had a nuclear stress test done on August 14 showing a reversible inferolateral perfusion defect as well  as inferolateral hypokinesis. He started on heparin and nitroglycerin drips. He had R date take an appropriate dose of aspirin at home. He remained pain-free following administering the nitroglycerin. Case is discussed with Dr. Abigail Butts of cardiology service who agrees to admit the patient.  Final Clinical Impressions(s) / ED Diagnoses   Final diagnoses:  Unstable angina (HCC)    New Prescriptions New Prescriptions   No medications on file    I personally performed the services described in this documentation, which was scribed in my presence. The recorded information has been reviewed and is accurate.      Dillon Fuel, MD [id number]

## 2016-01-14 NOTE — ED Triage Notes (Signed)
Per [facility] coming from home. At 2130 sudden onset CP while sitting watching tv. 5/10 Pain Radiation to Bilateral shoulders.Pt had a similar episode 3 weeks ago pt had CP. Pt normally able to take a NTG and it go away. Stress test on 15th. 324 ASA self administered and 2 NTG self administered. Pt called 911 after 2nd NTG did not resolve the CP. CP resolved on ride to ER.Pt originally cold and clammy upon EMS arrival. No CP no SOB now. No IV access

## 2016-01-15 ENCOUNTER — Emergency Department (HOSPITAL_COMMUNITY): Payer: PPO

## 2016-01-15 ENCOUNTER — Other Ambulatory Visit: Payer: Self-pay

## 2016-01-15 DIAGNOSIS — G4730 Sleep apnea, unspecified: Secondary | ICD-10-CM | POA: Diagnosis not present

## 2016-01-15 DIAGNOSIS — R001 Bradycardia, unspecified: Secondary | ICD-10-CM | POA: Diagnosis not present

## 2016-01-15 DIAGNOSIS — I272 Other secondary pulmonary hypertension: Secondary | ICD-10-CM | POA: Diagnosis not present

## 2016-01-15 DIAGNOSIS — Z8546 Personal history of malignant neoplasm of prostate: Secondary | ICD-10-CM | POA: Diagnosis not present

## 2016-01-15 DIAGNOSIS — R079 Chest pain, unspecified: Secondary | ICD-10-CM | POA: Diagnosis not present

## 2016-01-15 DIAGNOSIS — Z961 Presence of intraocular lens: Secondary | ICD-10-CM | POA: Diagnosis not present

## 2016-01-15 DIAGNOSIS — I10 Essential (primary) hypertension: Secondary | ICD-10-CM | POA: Diagnosis not present

## 2016-01-15 DIAGNOSIS — I200 Unstable angina: Secondary | ICD-10-CM | POA: Diagnosis not present

## 2016-01-15 DIAGNOSIS — N400 Enlarged prostate without lower urinary tract symptoms: Secondary | ICD-10-CM | POA: Diagnosis not present

## 2016-01-15 DIAGNOSIS — E1151 Type 2 diabetes mellitus with diabetic peripheral angiopathy without gangrene: Secondary | ICD-10-CM | POA: Diagnosis not present

## 2016-01-15 DIAGNOSIS — I25110 Atherosclerotic heart disease of native coronary artery with unstable angina pectoris: Secondary | ICD-10-CM | POA: Diagnosis not present

## 2016-01-15 DIAGNOSIS — E1122 Type 2 diabetes mellitus with diabetic chronic kidney disease: Secondary | ICD-10-CM | POA: Diagnosis not present

## 2016-01-15 DIAGNOSIS — Z9842 Cataract extraction status, left eye: Secondary | ICD-10-CM | POA: Diagnosis not present

## 2016-01-15 DIAGNOSIS — I129 Hypertensive chronic kidney disease with stage 1 through stage 4 chronic kidney disease, or unspecified chronic kidney disease: Secondary | ICD-10-CM | POA: Diagnosis not present

## 2016-01-15 DIAGNOSIS — Z8673 Personal history of transient ischemic attack (TIA), and cerebral infarction without residual deficits: Secondary | ICD-10-CM | POA: Diagnosis not present

## 2016-01-15 DIAGNOSIS — Z9841 Cataract extraction status, right eye: Secondary | ICD-10-CM | POA: Diagnosis not present

## 2016-01-15 DIAGNOSIS — E785 Hyperlipidemia, unspecified: Secondary | ICD-10-CM | POA: Diagnosis not present

## 2016-01-15 DIAGNOSIS — Z87891 Personal history of nicotine dependence: Secondary | ICD-10-CM | POA: Diagnosis not present

## 2016-01-15 DIAGNOSIS — N189 Chronic kidney disease, unspecified: Secondary | ICD-10-CM | POA: Diagnosis not present

## 2016-01-15 DIAGNOSIS — Z79899 Other long term (current) drug therapy: Secondary | ICD-10-CM | POA: Diagnosis not present

## 2016-01-15 DIAGNOSIS — K219 Gastro-esophageal reflux disease without esophagitis: Secondary | ICD-10-CM | POA: Diagnosis not present

## 2016-01-15 LAB — TROPONIN I
Troponin I: 0.04 ng/mL (ref <0.03)
Troponin I: 0.06 ng/mL (ref <0.03)
Troponin I: 0.06 ng/mL (ref <0.03)
Troponin I: <0.03 ng/mL (ref <0.03)

## 2016-01-15 LAB — CBC WITH DIFFERENTIAL/PLATELET
BASOS PCT: 0 %
Basophils Absolute: 0 10*3/uL (ref 0.0–0.1)
EOS ABS: 0.1 10*3/uL (ref 0.0–0.7)
Eosinophils Relative: 2 %
HEMATOCRIT: 45.8 % (ref 39.0–52.0)
HEMOGLOBIN: 15.1 g/dL (ref 13.0–17.0)
LYMPHS ABS: 1.3 10*3/uL (ref 0.7–4.0)
Lymphocytes Relative: 17 %
MCH: 29.2 pg (ref 26.0–34.0)
MCHC: 33 g/dL (ref 30.0–36.0)
MCV: 88.6 fL (ref 78.0–100.0)
Monocytes Absolute: 0.9 10*3/uL (ref 0.1–1.0)
Monocytes Relative: 12 %
NEUTROS ABS: 5.2 10*3/uL (ref 1.7–7.7)
NEUTROS PCT: 69 %
Platelets: 180 10*3/uL (ref 150–400)
RBC: 5.17 MIL/uL (ref 4.22–5.81)
RDW: 12.9 % (ref 11.5–15.5)
WBC: 7.5 10*3/uL (ref 4.0–10.5)

## 2016-01-15 LAB — LIPID PANEL
CHOL/HDL RATIO: 3.1 ratio
CHOLESTEROL: 107 mg/dL (ref 0–200)
HDL: 35 mg/dL — AB (ref >40)
LDL Cholesterol: 52 mg/dL (ref 0–99)
TRIGLYCERIDES: 102 mg/dL (ref <150)
VLDL: 20 mg/dL (ref 0–40)

## 2016-01-15 LAB — CBC
HEMATOCRIT: 43.2 % (ref 39.0–52.0)
HEMOGLOBIN: 13.8 g/dL (ref 13.0–17.0)
MCH: 28 pg (ref 26.0–34.0)
MCHC: 31.9 g/dL (ref 30.0–36.0)
MCV: 87.8 fL (ref 78.0–100.0)
Platelets: 166 10*3/uL (ref 150–400)
RBC: 4.92 MIL/uL (ref 4.22–5.81)
RDW: 13.2 % (ref 11.5–15.5)
WBC: 6.5 10*3/uL (ref 4.0–10.5)

## 2016-01-15 LAB — BASIC METABOLIC PANEL
ANION GAP: 7 (ref 5–15)
ANION GAP: 8 (ref 5–15)
BUN: 23 mg/dL — ABNORMAL HIGH (ref 6–20)
BUN: 24 mg/dL — ABNORMAL HIGH (ref 6–20)
CALCIUM: 9.2 mg/dL (ref 8.9–10.3)
CALCIUM: 9.7 mg/dL (ref 8.9–10.3)
CO2: 26 mmol/L (ref 22–32)
CO2: 27 mmol/L (ref 22–32)
Chloride: 104 mmol/L (ref 101–111)
Chloride: 106 mmol/L (ref 101–111)
Creatinine, Ser: 1.2 mg/dL (ref 0.61–1.24)
Creatinine, Ser: 1.22 mg/dL (ref 0.61–1.24)
GFR, EST NON AFRICAN AMERICAN: 54 mL/min — AB (ref >60)
GFR, EST NON AFRICAN AMERICAN: 55 mL/min — AB (ref >60)
GLUCOSE: 133 mg/dL — AB (ref 65–99)
Glucose, Bld: 118 mg/dL — ABNORMAL HIGH (ref 65–99)
POTASSIUM: 3.8 mmol/L (ref 3.5–5.1)
POTASSIUM: 4.1 mmol/L (ref 3.5–5.1)
Sodium: 139 mmol/L (ref 135–145)
Sodium: 139 mmol/L (ref 135–145)

## 2016-01-15 LAB — HEPARIN LEVEL (UNFRACTIONATED)
HEPARIN UNFRACTIONATED: 0.75 [IU]/mL — AB (ref 0.30–0.70)
HEPARIN UNFRACTIONATED: 0.53 [IU]/mL (ref 0.30–0.70)

## 2016-01-15 LAB — MRSA PCR SCREENING: MRSA BY PCR: NEGATIVE

## 2016-01-15 LAB — PROTIME-INR
INR: 1.06
PROTHROMBIN TIME: 13.8 s (ref 11.4–15.2)

## 2016-01-15 MED ORDER — BENEFIBER PO POWD: 10.0000 mL | Freq: Every day | ORAL | Status: DC

## 2016-01-15 MED ORDER — HEPARIN (PORCINE) IN NACL 100-0.45 UNIT/ML-% IJ SOLN
1100.0000 [IU]/h | INTRAMUSCULAR | Status: DC
  Administered 2016-01-15: 900 [IU]/h via INTRAVENOUS
  Filled 2016-01-15: qty 250

## 2016-01-15 MED ORDER — VITAMIN C 500 MG PO TABS
500.0000 mg | ORAL_TABLET | Freq: Every day | ORAL | Status: DC
  Administered 2016-01-15 – 2016-01-18 (×4): 500 mg via ORAL
  Filled 2016-01-15 (×4): qty 1

## 2016-01-15 MED ORDER — SODIUM CHLORIDE 0.9 % WEIGHT BASED INFUSION
1.0000 mL/kg/h | INTRAVENOUS | Status: DC
  Administered 2016-01-16: 1 mL/kg/h via INTRAVENOUS

## 2016-01-15 MED ORDER — MELATONIN 5 MG PO CAPS: 1.0000 | ORAL_CAPSULE | Freq: Every day | ORAL | Status: DC | PRN

## 2016-01-15 MED ORDER — SODIUM CHLORIDE 0.9% FLUSH
3.0000 mL | Freq: Two times a day (BID) | INTRAVENOUS | Status: DC
  Administered 2016-01-15: 3 mL via INTRAVENOUS

## 2016-01-15 MED ORDER — IRBESARTAN 150 MG PO TABS
300.0000 mg | ORAL_TABLET | Freq: Every day | ORAL | Status: DC
  Administered 2016-01-15 – 2016-01-18 (×4): 300 mg via ORAL
  Filled 2016-01-15 (×3): qty 1
  Filled 2016-01-15: qty 2

## 2016-01-15 MED ORDER — SODIUM CHLORIDE 0.9% FLUSH: 3.0000 mL | INTRAVENOUS | Status: DC | PRN

## 2016-01-15 MED ORDER — DICYCLOMINE HCL 20 MG PO TABS
20.0000 mg | ORAL_TABLET | Freq: Four times a day (QID) | ORAL | Status: DC | PRN
  Filled 2016-01-15: qty 1

## 2016-01-15 MED ORDER — ROSUVASTATIN CALCIUM 10 MG PO TABS
10.0000 mg | ORAL_TABLET | Freq: Every day | ORAL | Status: DC
  Administered 2016-01-15 – 2016-01-18 (×4): 10 mg via ORAL
  Filled 2016-01-15 (×4): qty 1

## 2016-01-15 MED ORDER — NITROGLYCERIN 0.4 MG SL SUBL: 0.4000 mg | SUBLINGUAL_TABLET | SUBLINGUAL | Status: DC | PRN

## 2016-01-15 MED ORDER — HYDROCHLOROTHIAZIDE 12.5 MG PO CAPS
12.5000 mg | ORAL_CAPSULE | Freq: Every day | ORAL | Status: DC
  Administered 2016-01-15 – 2016-01-18 (×4): 12.5 mg via ORAL
  Filled 2016-01-15 (×4): qty 1

## 2016-01-15 MED ORDER — MELATONIN 3 MG PO TABS
6.0000 mg | ORAL_TABLET | Freq: Every evening | ORAL | Status: DC | PRN
  Filled 2016-01-15: qty 2

## 2016-01-15 MED ORDER — ASPIRIN EC 81 MG PO TBEC: 81.0000 mg | DELAYED_RELEASE_TABLET | Freq: Every day | ORAL | Status: DC

## 2016-01-15 MED ORDER — VITAMIN D 1000 UNITS PO TABS
1000.0000 [IU] | ORAL_TABLET | Freq: Every day | ORAL | Status: DC
  Administered 2016-01-15 – 2016-01-18 (×4): 1000 [IU] via ORAL
  Filled 2016-01-15 (×4): qty 1

## 2016-01-15 MED ORDER — SODIUM CHLORIDE 0.9 % WEIGHT BASED INFUSION
3.0000 mL/kg/h | INTRAVENOUS | Status: DC
  Administered 2016-01-16: 3 mL/kg/h via INTRAVENOUS

## 2016-01-15 MED ORDER — METOPROLOL SUCCINATE ER 25 MG PO TB24: 25.0000 mg | ORAL_TABLET | Freq: Every evening | ORAL | Status: DC

## 2016-01-15 MED ORDER — AMLODIPINE BESYLATE 5 MG PO TABS
5.0000 mg | ORAL_TABLET | Freq: Every day | ORAL | Status: DC
  Administered 2016-01-15: 5 mg via ORAL
  Filled 2016-01-15: qty 1

## 2016-01-15 MED ORDER — DOXEPIN HCL 10 MG/ML PO CONC
3.0000 mg | Freq: Every evening | ORAL | Status: DC | PRN
  Filled 2016-01-15: qty 0.3

## 2016-01-15 MED ORDER — HEPARIN (PORCINE) IN NACL 100-0.45 UNIT/ML-% IJ SOLN
1000.0000 [IU]/h | INTRAMUSCULAR | Status: DC
  Administered 2016-01-15: 1000 [IU]/h via INTRAVENOUS
  Filled 2016-01-15: qty 250

## 2016-01-15 MED ORDER — HEPARIN BOLUS VIA INFUSION
4000.0000 [IU] | Freq: Once | INTRAVENOUS | Status: AC
  Administered 2016-01-15: 4000 [IU] via INTRAVENOUS
  Filled 2016-01-15: qty 4000

## 2016-01-15 MED ORDER — ACETAMINOPHEN 325 MG PO TABS: 650.0000 mg | ORAL_TABLET | ORAL | Status: DC | PRN

## 2016-01-15 MED ORDER — SODIUM CHLORIDE 0.9 % IV SOLN: 250.0000 mL | INTRAVENOUS | Status: DC | PRN

## 2016-01-15 MED ORDER — ONDANSETRON HCL 4 MG/2ML IJ SOLN
4.0000 mg | Freq: Four times a day (QID) | INTRAMUSCULAR | Status: DC | PRN
  Filled 2016-01-15: qty 2

## 2016-01-15 MED ORDER — FLUTICASONE PROPIONATE 50 MCG/ACT NA SUSP
1.0000 | Freq: Every day | NASAL | Status: DC | PRN
  Filled 2016-01-15: qty 16

## 2016-01-15 MED ORDER — IRBESARTAN-HYDROCHLOROTHIAZIDE 300-12.5 MG PO TABS: 1.0000 | ORAL_TABLET | Freq: Every day | ORAL | Status: DC

## 2016-01-15 MED ORDER — POLYETHYLENE GLYCOL 3350 17 G PO PACK: 17.0000 g | PACK | Freq: Every day | ORAL | Status: DC | PRN

## 2016-01-15 MED ORDER — HYDROCORTISONE 2.5 % RE CREA
1.0000 "application " | TOPICAL_CREAM | Freq: Two times a day (BID) | RECTAL | Status: DC | PRN
  Filled 2016-01-15: qty 28.35

## 2016-01-15 MED ORDER — ASPIRIN EC 81 MG PO TBEC
81.0000 mg | DELAYED_RELEASE_TABLET | Freq: Every evening | ORAL | Status: DC
  Administered 2016-01-15 – 2016-01-17 (×2): 81 mg via ORAL
  Filled 2016-01-15 (×2): qty 1

## 2016-01-15 NOTE — Progress Notes (Addendum)
ANTICOAGULATION CONSULT NOTE - Follow Up Consult   Pharmacy Consult for Heparin Indication: chest pain/ACS  Allergies  Allergen Reactions  . Lipitor [Atorvastatin] Other (See Comments)    myalgias    Patient Measurements: Height: 5\' 9"  (175.3 cm) Weight: 192 lb 7.4 oz (87.3 kg) IBW/kg (Calculated) : 70.7  Vital Signs: Temp: 97.9 F (36.6 C) (08/20 1100) Temp Source: Oral (08/20 0800) BP: 159/149 (08/20 1500) Pulse Rate: 57 (08/20 1500)  Labs:  Recent Labs  01/15/16 0017 01/15/16 0450 01/15/16 0809 01/15/16 1458 01/15/16 1641  HGB 15.1 13.8  --   --   --   HCT 45.8 43.2  --   --   --   PLT 180 166  --   --   --   LABPROT  --  13.8  --   --   --   INR  --  1.06  --   --   --   HEPARINUNFRC  --   --  0.75*  --  0.53  CREATININE 1.22 1.20  --   --   --   TROPONINI <0.03 0.04* 0.06* 0.06*  --     Estimated Creatinine Clearance: 52.8 mL/min (by C-G formula based on SCr of 1.2 mg/dL).   Medications:  Heparin gtt @ 1000 units/hr  Assessment: 82 yo M presenting on 01/14/2016 with CP. Recent stress test concerning for ischemia. Pharmacy to dose heparin for ACS. HL this evening is therapeutic at 0.53 after rate decrease down to 900 units/hr. CBC stable. No issues reported.  Goal of Therapy:  Heparin level 0.3-0.7 units/ml Monitor platelets by anticoagulation protocol: Yes   Plan:  - Continue heparin infusion at 900 units/hr - HL in AM to confirm - Daily HL/CBC - Monitor for s/s bleeding - F/u plans for [facility]  Cassie L. Nicole Kindred, PharmD Clinical Pharmacist Pager: [phone] 01/15/2016 5:42 PM

## 2016-01-15 NOTE — Progress Notes (Signed)
ANTICOAGULATION CONSULT NOTE - Follow Up Consult   Pharmacy Consult for Heparin Indication: chest pain/ACS  Allergies  Allergen Reactions  . Lipitor [Atorvastatin] Other (See Comments)    myalgias    Patient Measurements: Height: 5\' 9"  (175.3 cm) Weight: 192 lb 7.4 oz (87.3 kg) IBW/kg (Calculated) : 70.7  Vital Signs: Temp: 97.6 F (36.4 C) (08/20 0800) Temp Source: Oral (08/20 0800) BP: 143/58 (08/20 0600) Pulse Rate: 56 (08/20 0600)  Labs:  Recent Labs  01/15/16 0017 01/15/16 0450 01/15/16 0809  HGB 15.1 13.8  --   HCT 45.8 43.2  --   PLT 180 166  --   LABPROT  --  13.8  --   INR  --  1.06  --   HEPARINUNFRC  --   --  0.75*  CREATININE 1.22 1.20  --   TROPONINI <0.03 0.04*  --     Estimated Creatinine Clearance: 52.8 mL/min (by C-G formula based on SCr of 1.2 mg/dL).   Medications:  Heparin gtt @ 1000 units/hr  Assessment: 82 yo M presenting on 01/14/2016 with CP. Recent stress test concerning for ischemia. Pharmacy to dose heparin for ACS.   Initial HL slightly above goal at 0.75. CBC remains wnl and stable with no reported s/s bleeding.   Goal of Therapy:  Heparin level 0.3-0.7 units/ml Monitor platelets by anticoagulation protocol: Yes   Plan:  - Decrease heparin gtt slightly to 900 units/hr - HL in 8 hours - Daily HL/CBC - Monitor for s/s bleeding - F/u plans for [facility]  Rayaan Lorah K. Velva Harman, PharmD, BCPS, CPP Clinical Pharmacist Pager: [phone] Phone: [phone] 01/15/2016 8:56 AM

## 2016-01-15 NOTE — Progress Notes (Signed)
ANTICOAGULATION CONSULT NOTE - Initial Consult  Pharmacy Consult for Heparin Indication: chest pain/ACS  Allergies  Allergen Reactions  . Lipitor [Atorvastatin] Other (See Comments)    myalgias    Patient Measurements:   Heparin Dosing Weight: 85 kg  Vital Signs: Temp: 97.7 F (36.5 C) (08/19 2317) Temp Source: Oral (08/19 2317) BP: 140/56 (08/19 2317)  Labs: No results for input(s): HGB, HCT, PLT, APTT, LABPROT, INR, HEPARINUNFRC, HEPRLOWMOCWT, CREATININE, CKTOTAL, CKMB, TROPONINI in the last 72 hours.  CrCl cannot be calculated (Patient's most recent lab result is older than the maximum 21 days allowed.).   Medical History: Past Medical History:  Diagnosis Date  . Adenomatous colon polyp   . Allergic rhinitis   . BPH (benign prostatic hyperplasia)   . CAD (coronary artery disease)    sees Dr. [name]   . Carotid artery disease ([place])   . Chronic kidney disease    Sones. Prostate cancer  . CVA (cerebral infarction)   . Diabetes mellitus    "Borderline"  . Diverticulitis   . GERD (gastroesophageal reflux disease)    history  . Hemorrhoids   . HTN (hypertension)   . Hyperlipidemia    echo 10-25-2010 mild abnormalities  . IBS (irritable bowel syndrome)   . Low back pain   . Prostate cancer ([place]) 2008   seed implantation  . Sleep apnea    last study was aborted.  Has never worn a CPAP.    Marland Kitchen Smoker     Medications:  No current facility-administered medications on file prior to encounter.    Current Outpatient Prescriptions on File Prior to Encounter  Medication Sig Dispense Refill  . amLODipine (NORVASC) 5 MG tablet Take 1 tablet (5 mg total) by mouth daily. 90 tablet 3  . aspirin EC 81 MG tablet Take 81 mg by mouth every evening.     . cholecalciferol (VITAMIN D) 1000 UNITS tablet Take 1,000 Units by mouth daily.      Marland Kitchen dicyclomine (BENTYL) 20 MG tablet Take 1 tablet (20 mg total) by mouth every 6 (six) hours as needed for spasms. 30 tablet 5  .  Doxepin HCl (SILENOR) 3 MG TABS Take 1 tablet (3 mg total) by mouth at bedtime as needed. 30 tablet 11  . fluticasone (FLONASE) 50 MCG/ACT nasal spray Place 2 sprays into both nostrils as needed. 16 g 3  . hydrocortisone (ANUSOL-HC) 2.5 % rectal cream Place 1 application rectally 2 (two) times daily. Reported on 12/15/2015. Pt uses PRN    . irbesartan-hydrochlorothiazide (AVALIDE) 300-12.5 MG tablet Take 1 tablet by mouth daily. 90 tablet 3  . Melatonin 5 MG CAPS Take 1 capsule (5 mg total) by mouth at bedtime. 1 capsule 0  . metoprolol succinate (TOPROL-XL) 25 MG 24 hr tablet Take 1 tablet (25 mg total) by mouth every evening. Take with or immediately following a meal. 90 tablet 3  . nitroGLYCERIN (NITROSTAT) 0.4 MG SL tablet Place 1 tablet (0.4 mg total) under the tongue every 5 (five) minutes as needed for chest pain. (Patient not taking: Reported on 01/10/2016) 25 tablet 3  . polyethylene glycol powder (GLYCOLAX/MIRALAX) powder Take 17 g by mouth as needed. 255 g 3  . rosuvastatin (CRESTOR) 10 MG tablet Take 1 tablet (10 mg total) by mouth daily. 90 tablet 3  . vitamin C (ASCORBIC ACID) 500 MG tablet Take 500 mg by mouth daily.     . Wheat Dextrin (BENEFIBER) POWD 2 tablespoons a day  Assessment: 82 y.o. male with chest pain for heparin  Goal of Therapy:  Heparin level 0.3-0.7 units/ml Monitor platelets by anticoagulation protocol: Yes   Plan:  Heparin 4000 units IV bolus, then start heparin 1000 units/hr Check heparin level in 8 hours.   Caryl Pina 01/15/2016,12:01 AM

## 2016-01-15 NOTE — H&P (Signed)
Cardiology H&P    Patient ID: Dillon Young MRN: [id number], DOB/AGE: [birth year]   Admit date: 01/14/2016 Date of Consult: 01/15/2016  Primary Physician: [name], MD  Reason for Consult: Chest Pain Requesting Provider: ED  History of Present Illness    82 year old male with past medical history of carotid artery disease, chronic kidney disease, BPH, and CAD for whom he sees Dr. [name] presented to the hospital with a complaint of chest pain. The patient says the pain started this evening and felt like he a sharp sensation. It was not associated with radiation or diaphoresis. The patient was concerned with this pain and called 911. In the emergency department, the patient was started on a nitro gtt as well as a heparin gtt with resolution of his chest pain. Of note, he recently had a nuclear stress test on January 09, 2016 that was read as abnormal with a reversible medium sized, moderate-intensity basal inferolateral and basal to mid inferior perfusion defect.  Past Medical History   Past Medical History:  Diagnosis Date  . Adenomatous colon polyp   . Allergic rhinitis   . BPH (benign prostatic hyperplasia)   . CAD (coronary artery disease)    sees Dr. [name]   . Carotid artery disease ([place])   . Chronic kidney disease    [place]. Prostate cancer  . CVA (cerebral infarction)   . Diabetes mellitus    "Borderline"  . Diverticulitis   . GERD (gastroesophageal reflux disease)    history  . Hemorrhoids   . HTN (hypertension)   . Hyperlipidemia    echo 10-25-2010 mild abnormalities  . IBS (irritable bowel syndrome)   . Low back pain   . Prostate cancer ([place]) 2008   seed implantation  . Sleep apnea    last study was aborted.  Has never worn a CPAP.    Marland Kitchen Smoker     Past Surgical History:  Procedure Laterality Date  . APPENDECTOMY    . Cataract with Lens     Bilateral  . COLONOSCOPY  10/21/2008   diverticulosis, radiation proctitis, internal hemorrhoids  .  ENDARTERECTOMY  11/12/2011   Procedure: ENDARTERECTOMY CAROTID;  Surgeon: Rosetta Posner, MD;  Location: [facility] OR;  Service: Vascular;  Laterality: Right;  . Esophageal dilitation    . HEMORRHOID BANDING    . Lithrotripsy      x 3  . LUMBAR LAMINECTOMY    . TRANSURETHRAL RESECTION OF PROSTATE  10/2003     Allergies  Allergies  Allergen Reactions  . Lipitor [Atorvastatin] Other (See Comments)    myalgias    Inpatient Medications    . amLODipine  5 mg Oral Daily  . aspirin EC  81 mg Oral QPM  . BENEFIBER  10 mL Oral Daily  . cholecalciferol  1,000 Units Oral Daily  . irbesartan-hydrochlorothiazide  1 tablet Oral Daily  . metoprolol succinate  25 mg Oral QPM  . rosuvastatin  10 mg Oral Daily  . vitamin C  500 mg Oral Daily    Family History    Family History  Problem Relation Age of Onset  . Heart disease Father     Social History    Social History   Social History  . Marital status: Married    Spouse name: N/A  . Number of children: 2  . Years of education: N/A   Occupational History  . [employer]    Social History Main Topics  . Smoking  status: Former Smoker    Packs/day: 1.50    Years: 60.00    Types: Cigarettes    Quit date: 07/25/2008  . Smokeless tobacco: Never Used  . Alcohol use 0.0 oz/week     Comment: Occ  . Drug use: No  . Sexual activity: Not on file   Other Topics Concern  . Not on file   Social History Narrative  . No narrative on file     Review of Systems    General:  No chills, fever, night sweats or weight changes.  Cardiovascular:  No chest pain, dyspnea on exertion, edema, orthopnea, palpitations, paroxysmal nocturnal dyspnea. Dermatological: No rash, lesions/masses Respiratory: No cough, dyspnea Urologic: No hematuria, dysuria Abdominal:   No nausea, vomiting, diarrhea, bright red blood per rectum, melena, or hematemesis Neurologic:  No visual changes, wkns, changes in mental status. All other systems reviewed and  are otherwise negative except as noted above.  Physical Exam    Blood pressure 121/60, pulse (!) 49, temperature 97.7 F (36.5 C), temperature source Oral, resp. rate 16, SpO2 (!) 89 %.  GEN: NAD, A&Ox3 HEENT: NCAT, EOMI, No JVD CV: RRR, +S1/S2, No m/r/g's appreciated.   Pulm: CTA bilaterally  Abdomen: +BS, NTND Extremities: No c/c/e, 2+ pulses  Neuro: No focal neurological deficits, CN II-XII Grossly intact.   Labs    Troponin (Point of Care Test) No results for input(s): TROPIPOC in the last 72 hours.  Recent Labs  01/15/16 0017  TROPONINI <0.03   Lab Results  Component Value Date   WBC 7.5 01/15/2016   HGB 15.1 01/15/2016   HCT 45.8 01/15/2016   MCV 88.6 01/15/2016   PLT 180 01/15/2016    Recent Labs Lab 01/15/16 0017  NA 139  K 4.1  CL 104  CO2 27  BUN 23*  CREATININE 1.22  CALCIUM 9.7  GLUCOSE 133*   Lab Results  Component Value Date   CHOL 129 07/19/2015   HDL 41 07/19/2015   LDLCALC 62 07/19/2015   TRIG 128 07/19/2015   No results found for: [facility]   Radiology Studies    Dg Chest Port 1 View  Result Date: 01/15/2016 CLINICAL DATA:  Acute onset of generalized chest pain. Initial encounter. EXAM: PORTABLE CHEST 1 VIEW COMPARISON:  Chest radiograph performed 02/18/2012 FINDINGS: The lungs are well-aerated and clear. There is no evidence of focal opacification, pleural effusion or pneumothorax. The cardiomediastinal silhouette is mildly enlarged. No acute osseous abnormalities are seen. IMPRESSION: Mild cardiomegaly.  Lungs remain grossly clear. Electronically Signed   By: Garald Balding M.D.   On: 01/15/2016 00:39    EKG & Cardiac Imaging    EKG: Sinus rhythm at 59 beats per minute. Ventricular bigeminy. First Degree AV Block.   Echocardiogram (5/914):  - Left ventricle: The cavity size was normal. There was mild concentric hypertrophy. Systolic function was normal. The estimated ejection fraction was in the range of 50% to 55%. Wall  motion was normal; there were no regional wall motion abnormalities. Doppler parameters are consistent with abnormal left ventricular relaxation (grade 1 diastolic dysfunction). - Mitral valve: Calcified annulus. Mild regurgitation. Valve area by pressure half-time: 2.16cm^2. - Right ventricle: Systolic pressure was increased. - Atrial septum: No defect or patent foramen ovale was identified. - Tricuspid valve: Mild-moderate regurgitation. - Pulmonary arteries: PA peak pressure: 84mm Hg (S). Impressions: - The right ventricular systolic pressure was increased consistent with mild pulmonary hypertension. Recommendations: NSR-frequent unifocal PVC's on tracing with runs of Bigeminy. No significant  valvular disease with preserved LV systolic ffunction and mild diastolic relaxation abnormality.  Myocardial Perfusion Imaging 01/09/16:  The left ventricular ejection fraction is mildly decreased (45-54%).  Nuclear stress EF: 52%.  There was no ST segment deviation noted during stress.  Findings consistent with ischemia.  This is an intermediate risk study.   1. EF 52% with basal inferolateral hypokinesis.  2. Primarily reversible medium sized, moderate-intensity basal inferolateral and basal to mid inferior perfusion defect.  This is concerning for ischemia.   Assessment & Plan   82 year old male with past medical history as above presents with chest pain that began today in setting of a recently positive nuclear stress test.   1. Chest pain -Check cardiac enzymes x 3,  2D echo -Patient will need cath given chest pain and recent positive nuclear study. Agree with continuing heparin and nitro gtt for now. Will keep NPO.  -Continue Toprol Xl, statin, ASA, and ARB.   2. Hypertension -Pressure controlled on current meds; Will continue norvasc, ARB, HCTZ.  3. Hyperlipidemia -Continue with statin therapy and ASA.   Signed,  Holly Cell:  [phone]

## 2016-01-15 NOTE — Progress Notes (Signed)
SUBJECTIVE: The patient is doing well today.  At this time, he denies chest pain, shortness of breath, or any new concerns.  Marland Kitchen amLODipine  5 mg Oral Daily  . aspirin EC  81 mg Oral QPM  . cholecalciferol  1,000 Units Oral Daily  . hydrochlorothiazide  12.5 mg Oral Daily  . irbesartan  300 mg Oral Daily  . rosuvastatin  10 mg Oral Daily  . vitamin C  500 mg Oral Daily   . heparin      OBJECTIVE: Physical Exam: Vitals:   01/15/16 0525 01/15/16 0600 01/15/16 0700 01/15/16 0800  BP: (!) 155/66 (!) 143/58 (!) 155/53 (!) 150/72  Pulse: (!) 53 (!) 56 (!) 55 (!) 53  Resp: 19 12 13 17   Temp: 97.6 F (36.4 C)   97.6 F (36.4 C)  TempSrc: Oral   Oral  SpO2: 96% 93% 94% 95%  Weight: 192 lb 7.4 oz (87.3 kg)     Height: 5\' 9"  (1.753 m)       Intake/Output Summary (Last 24 hours) at 01/15/16 1038 Last data filed at 01/15/16 0800  Gross per 24 hour  Intake               40 ml  Output              300 ml  Net             -260 ml    Telemetry reveals sinus bradycardia with PVCs  GEN- The patient is well appearing, alert and oriented x 3 today.   Head- normocephalic, atraumatic Eyes-  Sclera clear, conjunctiva pink Ears- hearing intact Oropharynx- clear Neck- supple,   Lungs- Clear to ausculation bilaterally, normal work of breathing Heart- Regular rate and rhythm with ectopy GI- soft, NT, ND, + BS Extremities- no clubbing, cyanosis, or edema Skin- no rash or lesion Psych- euthymic mood, full affect Neuro- strength and sensation are intact  LABS: Basic Metabolic Panel:  Recent Labs  01/15/16 0017 01/15/16 0450  NA 139 139  K 4.1 3.8  CL 104 106  CO2 27 26  GLUCOSE 133* 118*  BUN 23* 24*  CREATININE 1.22 1.20  CALCIUM 9.7 9.2   Liver Function Tests: No results for input(s): AST, ALT, ALKPHOS, BILITOT, PROT, ALBUMIN in the last 72 hours. No results for input(s): LIPASE, AMYLASE in the last 72 hours. CBC:  Recent Labs  01/15/16 0017 01/15/16 0450  WBC 7.5  6.5  NEUTROABS 5.2  --   HGB 15.1 13.8  HCT 45.8 43.2  MCV 88.6 87.8  PLT 180 166   Cardiac Enzymes:  Recent Labs  01/15/16 0017 01/15/16 0450  TROPONINI <0.03 0.04*    RADIOLOGY: Dg Chest Port 1 View  Result Date: 01/15/2016 CLINICAL DATA:  Acute onset of generalized chest pain. Initial encounter. EXAM: PORTABLE CHEST 1 VIEW COMPARISON:  Chest radiograph performed 02/18/2012 FINDINGS: The lungs are well-aerated and clear. There is no evidence of focal opacification, pleural effusion or pneumothorax. The cardiomediastinal silhouette is mildly enlarged. No acute osseous abnormalities are seen. IMPRESSION: Mild cardiomegaly.  Lungs remain grossly clear. Electronically Signed   By: Garald Balding M.D.   On: 01/15/2016 00:39    ASSESSMENT AND PLAN:  1. Unstable angina Currently pain free ekg without ischemic changes I agree with Dr Abigail Butts that given recent abnormal myoview that cath is warranted. Given that the is currently pain free, will defer cath until tomorrow. Will feed patient and make NPO after midnight. Continue  current medical therapy.  On ASA, heparin drip Will not add beta blocker due to bradycardia  2. HTN Stable No change required today  3. HL On statin  [name], MD 01/15/2016 10:38 AM

## 2016-01-16 ENCOUNTER — Inpatient Hospital Stay (HOSPITAL_COMMUNITY): Payer: PPO

## 2016-01-16 ENCOUNTER — Encounter (HOSPITAL_COMMUNITY): Payer: Self-pay | Admitting: Interventional Cardiology

## 2016-01-16 ENCOUNTER — Inpatient Hospital Stay (HOSPITAL_COMMUNITY)
Admission: EM | Disposition: A | Discharge status: Home / Self Care | Payer: Self-pay | Source: Home / Self Care | Attending: Internal Medicine

## 2016-01-16 ENCOUNTER — Other Ambulatory Visit: Payer: Self-pay | Admitting: Emergency Medicine

## 2016-01-16 ENCOUNTER — Other Ambulatory Visit: Payer: Self-pay

## 2016-01-16 DIAGNOSIS — E1122 Type 2 diabetes mellitus with diabetic chronic kidney disease: Secondary | ICD-10-CM | POA: Diagnosis not present

## 2016-01-16 DIAGNOSIS — I25110 Atherosclerotic heart disease of native coronary artery with unstable angina pectoris: Principal | ICD-10-CM

## 2016-01-16 DIAGNOSIS — G4730 Sleep apnea, unspecified: Secondary | ICD-10-CM | POA: Diagnosis not present

## 2016-01-16 DIAGNOSIS — R079 Chest pain, unspecified: Secondary | ICD-10-CM | POA: Diagnosis not present

## 2016-01-16 DIAGNOSIS — K219 Gastro-esophageal reflux disease without esophagitis: Secondary | ICD-10-CM | POA: Diagnosis not present

## 2016-01-16 DIAGNOSIS — E785 Hyperlipidemia, unspecified: Secondary | ICD-10-CM

## 2016-01-16 DIAGNOSIS — I10 Essential (primary) hypertension: Secondary | ICD-10-CM | POA: Diagnosis not present

## 2016-01-16 DIAGNOSIS — Z8546 Personal history of malignant neoplasm of prostate: Secondary | ICD-10-CM | POA: Diagnosis not present

## 2016-01-16 DIAGNOSIS — I272 Other secondary pulmonary hypertension: Secondary | ICD-10-CM | POA: Diagnosis not present

## 2016-01-16 DIAGNOSIS — E1151 Type 2 diabetes mellitus with diabetic peripheral angiopathy without gangrene: Secondary | ICD-10-CM | POA: Diagnosis not present

## 2016-01-16 DIAGNOSIS — Z8673 Personal history of transient ischemic attack (TIA), and cerebral infarction without residual deficits: Secondary | ICD-10-CM | POA: Diagnosis not present

## 2016-01-16 DIAGNOSIS — I129 Hypertensive chronic kidney disease with stage 1 through stage 4 chronic kidney disease, or unspecified chronic kidney disease: Secondary | ICD-10-CM | POA: Diagnosis not present

## 2016-01-16 DIAGNOSIS — N400 Enlarged prostate without lower urinary tract symptoms: Secondary | ICD-10-CM | POA: Diagnosis not present

## 2016-01-16 DIAGNOSIS — I200 Unstable angina: Secondary | ICD-10-CM | POA: Diagnosis present

## 2016-01-16 DIAGNOSIS — N189 Chronic kidney disease, unspecified: Secondary | ICD-10-CM | POA: Diagnosis not present

## 2016-01-16 HISTORY — PX: CARDIAC CATHETERIZATION: SHX172

## 2016-01-16 LAB — POCT ACTIVATED CLOTTING TIME
Activated Clotting Time: 395 seconds
Activated Clotting Time: 153 seconds

## 2016-01-16 LAB — BASIC METABOLIC PANEL
ANION GAP: 6 (ref 5–15)
BUN: 19 mg/dL (ref 6–20)
CALCIUM: 9.4 mg/dL (ref 8.9–10.3)
CO2: 30 mmol/L (ref 22–32)
CREATININE: 1.11 mg/dL (ref 0.61–1.24)
Chloride: 105 mmol/L (ref 101–111)
Glucose, Bld: 112 mg/dL — ABNORMAL HIGH (ref 65–99)
Potassium: 4 mmol/L (ref 3.5–5.1)
SODIUM: 141 mmol/L (ref 135–145)

## 2016-01-16 LAB — ECHOCARDIOGRAM COMPLETE
FS: 14 % — AB (ref 28–44)
HEIGHTINCHES: 69 in
IVS/LV PW RATIO, ED: 1.04
LA diam end sys: 43 mm
LA diam index: 2.14 cm/m2
LA vol A4C: 64.4 ml
LA vol: 63.4 mL
LASIZE: 43 mm
LAVOLIN: 31.5 mL/m2
LDCA: 3.8 cm2
LV PW d: 9.66 mm — AB (ref 0.6–1.1)
LVOT diameter: 22 mm
Weight: 3001.78 oz

## 2016-01-16 LAB — CBC
HEMATOCRIT: 47 % (ref 39.0–52.0)
Hemoglobin: 15.2 g/dL (ref 13.0–17.0)
MCH: 28.7 pg (ref 26.0–34.0)
MCHC: 32.3 g/dL (ref 30.0–36.0)
MCV: 88.7 fL (ref 78.0–100.0)
PLATELETS: 181 10*3/uL (ref 150–400)
RBC: 5.3 MIL/uL (ref 4.22–5.81)
RDW: 13.1 % (ref 11.5–15.5)
WBC: 6.4 10*3/uL (ref 4.0–10.5)

## 2016-01-16 LAB — HEPARIN LEVEL (UNFRACTIONATED): HEPARIN UNFRACTIONATED: 0.24 [IU]/mL — AB (ref 0.30–0.70)

## 2016-01-16 SURGERY — LEFT HEART CATH AND CORONARY ANGIOGRAPHY
Anesthesia: LOCAL

## 2016-01-16 MED ORDER — ATROPINE SULFATE 1 MG/10ML IJ SOSY
0.5000 mg | PREFILLED_SYRINGE | Freq: Once | INTRAMUSCULAR | Status: AC
Start: 2016-01-16 — End: 2016-01-16
  Administered 2016-01-16: 0.5 mg via INTRAVENOUS

## 2016-01-16 MED ORDER — SODIUM CHLORIDE 0.9 % IV SOLN
1.7500 mg/kg/h | INTRAVENOUS | Status: AC
  Filled 2016-01-16: qty 250

## 2016-01-16 MED ORDER — SODIUM CHLORIDE 0.9 % IV SOLN
INTRAVENOUS | Status: DC | PRN
  Administered 2016-01-16: 1.75 mg/kg/h via INTRAVENOUS

## 2016-01-16 MED ORDER — BIVALIRUDIN 250 MG IV SOLR
INTRAVENOUS | Status: AC
  Filled 2016-01-16: qty 250

## 2016-01-16 MED ORDER — SODIUM CHLORIDE 0.9% FLUSH
3.0000 mL | Freq: Two times a day (BID) | INTRAVENOUS | Status: DC
  Administered 2016-01-17 (×2): 3 mL via INTRAVENOUS

## 2016-01-16 MED ORDER — GI COCKTAIL ~~LOC~~
30.0000 mL | Freq: Once | ORAL | Status: AC
  Administered 2016-01-16: 30 mL via ORAL
  Filled 2016-01-16: qty 30

## 2016-01-16 MED ORDER — AMLODIPINE BESYLATE 10 MG PO TABS
10.0000 mg | ORAL_TABLET | Freq: Every day | ORAL | Status: DC
  Administered 2016-01-16 – 2016-01-18 (×3): 10 mg via ORAL
  Filled 2016-01-16 (×3): qty 1

## 2016-01-16 MED ORDER — HYDRALAZINE HCL 20 MG/ML IJ SOLN
10.0000 mg | INTRAMUSCULAR | Status: DC | PRN
  Administered 2016-01-16: 10 mg via INTRAVENOUS
  Filled 2016-01-16: qty 1

## 2016-01-16 MED ORDER — IOPAMIDOL (ISOVUE-370) INJECTION 76%
INTRAVENOUS | Status: DC | PRN
  Administered 2016-01-16: 100 mL via INTRA_ARTERIAL

## 2016-01-16 MED ORDER — SODIUM CHLORIDE 0.9 % IV SOLN
INTRAVENOUS | Status: DC | PRN
  Administered 2016-01-16: 250 mL via INTRAVENOUS

## 2016-01-16 MED ORDER — CLOPIDOGREL BISULFATE 75 MG PO TABS
75.0000 mg | ORAL_TABLET | Freq: Every day | ORAL | Status: DC
  Administered 2016-01-17 – 2016-01-18 (×2): 75 mg via ORAL
  Filled 2016-01-16 (×2): qty 1

## 2016-01-16 MED ORDER — CLOPIDOGREL BISULFATE 300 MG PO TABS
ORAL_TABLET | ORAL | Status: AC
  Filled 2016-01-16: qty 1

## 2016-01-16 MED ORDER — MORPHINE SULFATE (PF) 2 MG/ML IV SOLN
2.0000 mg | INTRAVENOUS | Status: DC | PRN
  Administered 2016-01-16: 2 mg via INTRAVENOUS
  Filled 2016-01-16: qty 1

## 2016-01-16 MED ORDER — VERAPAMIL HCL 2.5 MG/ML IV SOLN
INTRAVENOUS | Status: AC
  Filled 2016-01-16: qty 2

## 2016-01-16 MED ORDER — SODIUM CHLORIDE 0.9 % WEIGHT BASED INFUSION: 1.0000 mL/kg/h | INTRAVENOUS | Status: AC

## 2016-01-16 MED ORDER — SODIUM CHLORIDE 0.9 % IV SOLN: 250.0000 mL | INTRAVENOUS | Status: DC | PRN

## 2016-01-16 MED ORDER — MIDAZOLAM HCL 2 MG/2ML IJ SOLN
INTRAMUSCULAR | Status: DC | PRN
  Administered 2016-01-16: 2 mg via INTRAVENOUS
  Administered 2016-01-16 (×2): 1 mg via INTRAVENOUS

## 2016-01-16 MED ORDER — ONDANSETRON HCL 4 MG/2ML IJ SOLN: 4.0000 mg | Freq: Four times a day (QID) | INTRAMUSCULAR | Status: DC | PRN

## 2016-01-16 MED ORDER — LIDOCAINE HCL (PF) 1 % IJ SOLN
INTRAMUSCULAR | Status: DC | PRN
  Administered 2016-01-16: 2 mL via INTRADERMAL
  Administered 2016-01-16: 20 mL via INTRADERMAL

## 2016-01-16 MED ORDER — MIDAZOLAM HCL 2 MG/2ML IJ SOLN
INTRAMUSCULAR | Status: AC
  Filled 2016-01-16: qty 2

## 2016-01-16 MED ORDER — ATROPINE SULFATE 1 MG/10ML IJ SOSY
PREFILLED_SYRINGE | INTRAMUSCULAR | Status: AC
Start: 2016-01-16 — End: 2016-01-16
  Administered 2016-01-16: 0.5 mg
  Filled 2016-01-16: qty 10

## 2016-01-16 MED ORDER — LIDOCAINE HCL (PF) 1 % IJ SOLN
INTRAMUSCULAR | Status: AC
  Filled 2016-01-16: qty 30

## 2016-01-16 MED ORDER — SODIUM CHLORIDE 0.9% FLUSH: 3.0000 mL | INTRAVENOUS | Status: DC | PRN

## 2016-01-16 MED ORDER — ACETAMINOPHEN 325 MG PO TABS: 650.0000 mg | ORAL_TABLET | ORAL | Status: DC | PRN

## 2016-01-16 MED ORDER — SODIUM CHLORIDE 0.9 % IV BOLUS (SEPSIS)
500.0000 mL | Freq: Once | INTRAVENOUS | Status: AC
  Administered 2016-01-16: 500 mL via INTRAVENOUS

## 2016-01-16 MED ORDER — FENTANYL CITRATE (PF) 100 MCG/2ML IJ SOLN
INTRAMUSCULAR | Status: DC | PRN
  Administered 2016-01-16: 25 ug via INTRAVENOUS
  Administered 2016-01-16: 50 ug via INTRAVENOUS
  Administered 2016-01-16: 25 ug via INTRAVENOUS

## 2016-01-16 MED ORDER — FENTANYL CITRATE (PF) 100 MCG/2ML IJ SOLN
INTRAMUSCULAR | Status: AC
  Filled 2016-01-16: qty 2

## 2016-01-16 MED ORDER — BIVALIRUDIN BOLUS VIA INFUSION - CUPID
INTRAVENOUS | Status: DC | PRN
  Administered 2016-01-16: 63.825 mg via INTRAVENOUS

## 2016-01-16 MED ORDER — NITROGLYCERIN 1 MG/10 ML FOR IR/CATH LAB
INTRA_ARTERIAL | Status: AC
  Filled 2016-01-16: qty 10

## 2016-01-16 MED ORDER — CLOPIDOGREL BISULFATE 300 MG PO TABS
ORAL_TABLET | ORAL | Status: DC | PRN
  Administered 2016-01-16: 600 mg via ORAL

## 2016-01-16 MED ORDER — HEPARIN (PORCINE) IN NACL 2-0.9 UNIT/ML-% IJ SOLN
INTRAMUSCULAR | Status: DC | PRN
  Administered 2016-01-16: 1500 mL

## 2016-01-16 MED ORDER — ASPIRIN 81 MG PO CHEW: 81.0000 mg | CHEWABLE_TABLET | Freq: Every day | ORAL | Status: DC

## 2016-01-16 MED ORDER — HEPARIN (PORCINE) IN NACL 2-0.9 UNIT/ML-% IJ SOLN
INTRAMUSCULAR | Status: AC
  Filled 2016-01-16: qty 1000

## 2016-01-16 SURGICAL SUPPLY — 20 items
BALLN ANGIOSCULPT RX 2.5X10 (BALLOONS) ×2
BALLN ~~LOC~~ TREK RX 4.0X15 (BALLOONS) ×2
BALLOON ANGIOSCULPT RX 2.5X10 (BALLOONS) ×1 IMPLANT
BALLOON ~~LOC~~ TREK RX 4.0X15 (BALLOONS) IMPLANT
CATH INFINITI 5FR JL4 (CATHETERS) ×1 IMPLANT
CATH INFINITI JR4 5F (CATHETERS) ×2 IMPLANT
GLIDESHEATH SLEND SS 6F .021 (SHEATH) ×1 IMPLANT
GUIDE CATH RUNWAY 6FR FR4 (CATHETERS) ×1 IMPLANT
KIT ENCORE 26 ADVANTAGE (KITS) ×1 IMPLANT
KIT HEART LEFT (KITS) ×2 IMPLANT
PACK CARDIAC CATHETERIZATION (CUSTOM PROCEDURE TRAY) ×2 IMPLANT
SHEATH PINNACLE 5F 10CM (SHEATH) ×1 IMPLANT
SHEATH PINNACLE 6F 10CM (SHEATH) ×1 IMPLANT
STENT SYNERGY DES 3.5X28 (Permanent Stent) ×1 IMPLANT
TRANSDUCER W/STOPCOCK (MISCELLANEOUS) ×2 IMPLANT
TUBING CIL FLEX 10 FLL-RA (TUBING) ×2 IMPLANT
VALVE GUARDIAN II ~~LOC~~ HEMO (MISCELLANEOUS) ×1 IMPLANT
WIRE ASAHI PROWATER 180CM (WIRE) ×1 IMPLANT
WIRE EMERALD 3MM-J .035X150CM (WIRE) ×1 IMPLANT
WIRE SAFE-T 1.5MM-J .035X260CM (WIRE) ×1 IMPLANT

## 2016-01-16 NOTE — Progress Notes (Signed)
    Called by nursing staff. Patient reports burning chest pain that started during his PCI this afternoon. Reports the burning started when they were attempting to pass through his stents that were previous placed. Pain did improve during cath, but has now returned. Rates 5/10. EKG shows no acute changes. He is in no acute distress, able to hold a conversation comfortably. Will order a GI cocktail to be given first, also ordered PRN morphine. Continue to monitor.    Reino Bellis, NP-C

## 2016-01-16 NOTE — Interval H&P Note (Signed)
Cath Lab Visit (complete for each Cath Lab visit)  Clinical Evaluation Leading to the Procedure:   ACS: Yes.    Non-ACS:    Anginal Classification: CCS IV  Anti-ischemic medical therapy: Minimal Therapy (1 class of medications)  Non-Invasive Test Results: Intermediate-risk stress test findings: cardiac mortality 1-3%/year  Prior CABG: No previous CABG      History and Physical Interval Note:  01/16/2016 1:29 PM  [name]  has presented today for surgery, with the diagnosis of unstable angina  The various methods of treatment have been discussed with the patient and family. After consideration of risks, benefits and other options for treatment, the patient has consented to  Procedure(s): Left Heart Cath and Coronary Angiography (N/A) as a surgical intervention .  The patient's history has been reviewed, patient examined, no change in status, stable for surgery.  I have reviewed the patient's chart and labs.  Questions were answered to the patient's satisfaction.     Larae Grooms

## 2016-01-16 NOTE — Progress Notes (Signed)
Discontinued right femoral sheath at 1827. After 5 minutes, pt. C/o nausea. BP 83 systolic, HR 40. NS bolus 500 mls started and Atropine 0.5 mg IV given. BP and HR increased. Nausea continues. Pressure held for 25 minutes and pressure dsg. Applied.  Zofran 4 mg IV given at 1900.  1915 nausea continues. 1930. Pt. Sleeping.

## 2016-01-16 NOTE — Telephone Encounter (Signed)
Pharmacy called and wanted to know since they are a mail order service if they can send pt a 90 day supply and if so would you like any refills on that 90 day supply? Please advise thanks.

## 2016-01-16 NOTE — Telephone Encounter (Signed)
Patient is currently admitted.   Contacted pharmacy and informed of same.   Will hold/pend rx til pt is discharged. Pharmacy had deleted the order until further notice.

## 2016-01-16 NOTE — Progress Notes (Signed)
[place] for Heparin Indication: chest pain/ACS  Allergies  Allergen Reactions  . Lipitor [Atorvastatin] Other (See Comments)    myalgias    Patient Measurements: Height: 5\' 9"  (175.3 cm) Weight: 192 lb 7.4 oz (87.3 kg) IBW/kg (Calculated) : 70.7 Heparin Dosing Weight: 85 kg  Vital Signs: Temp: 97.8 F (36.6 C) (08/21 0335) Temp Source: Oral (08/21 0335) BP: 141/66 (08/21 0300) Pulse Rate: 39 (08/21 0200)  Labs:  Recent Labs  01/15/16 0017 01/15/16 0450 01/15/16 0809 01/15/16 1458 01/15/16 1641 01/16/16 0304  HGB 15.1 13.8  --   --   --  15.2  HCT 45.8 43.2  --   --   --  47.0  PLT 180 166  --   --   --  181  LABPROT  --  13.8  --   --   --   --   INR  --  1.06  --   --   --   --   HEPARINUNFRC  --   --  0.75*  --  0.53 0.24*  CREATININE 1.22 1.20  --   --   --  1.11  TROPONINI <0.03 0.04* 0.06* 0.06*  --   --     Estimated Creatinine Clearance: 57.1 mL/min (by C-G formula based on SCr of 1.11 mg/dL).   Assessment: 82 y.o. male with chest pain for heparin  Goal of Therapy:  Heparin level 0.3-0.7 units/ml Monitor platelets by anticoagulation protocol: Yes   Plan:  Increase Heparin 1100 units/hr F/U after cath  Caryl Pina 01/16/2016,3:44 AM

## 2016-01-16 NOTE — H&P (View-Only) (Signed)
    Subjective:  Denies CP or dyspnea   Objective:  Vitals:   01/16/16 0335 01/16/16 0400 01/16/16 0500 01/16/16 0600  BP:  (!) 161/117 (!) 141/58 (!) 144/66  Pulse:   (!) 50 (!) 45  Resp:  14 15 12   Temp: 97.8 F (36.6 C)     TempSrc: Oral     SpO2:  96% 95% 97%  Weight:    187 lb 9.8 oz (85.1 kg)  Height:        Intake/Output from previous day:  Intake/Output Summary (Last 24 hours) at 01/16/16 0714 Last data filed at 01/16/16 0600  Gross per 24 hour  Intake           936.43 ml  Output             2725 ml  Net         -1788.57 ml    Physical Exam: Physical exam: Well-developed well-nourished in no acute distress.  Skin is warm and dry.  HEENT is normal.  Neck is supple.  Chest is clear to auscultation with normal expansion.  Cardiovascular exam is regular rate and rhythm.  Abdominal exam nontender or distended. No masses palpated. Extremities show no edema. neuro grossly intact    Lab Results: Basic Metabolic Panel:  Recent Labs  01/15/16 0450 01/16/16 0304  NA 139 141  K 3.8 4.0  CL 106 105  CO2 26 30  GLUCOSE 118* 112*  BUN 24* 19  CREATININE 1.20 1.11  CALCIUM 9.2 9.4   CBC:  Recent Labs  01/15/16 0017 01/15/16 0450 01/16/16 0304  WBC 7.5 6.5 6.4  NEUTROABS 5.2  --   --   HGB 15.1 13.8 15.2  HCT 45.8 43.2 47.0  MCV 88.6 87.8 88.7  PLT 180 166 181   Cardiac Enzymes:  Recent Labs  01/15/16 0450 01/15/16 0809 01/15/16 1458  TROPONINI 0.04* 0.06* 0.06*     Assessment/Plan:  1 unstable angina-patient presented with chest pain and has ruled out (there is no clear trend with his enzymes). He had a recent abnormal nuclear study. Continue aspirin, heparin and statin. I will not add a beta blocker as he is somewhat bradycardic on telemetry. Proceed with cardiac catheterization. The risks and benefits were discussed including myocardial infarction, CVA and death. He agrees to proceed.  2 peripheral vascular disease-continue aspirin and  statin. 3 hypertension-blood pressure is elevated. Increase amlodipine to 10 mg daily and follow. 4 hyperlipidemia-continue Crestor. Would consider increasing to 40 mg daily as an outpatient. He did not tolerate Lipitor previously.  Kirk Ruths 01/16/2016, 7:14 AM

## 2016-01-16 NOTE — Telephone Encounter (Deleted)
Contacted pt and informed of same.

## 2016-01-16 NOTE — Progress Notes (Signed)
    Subjective:  Denies CP or dyspnea   Objective:  Vitals:   01/16/16 0335 01/16/16 0400 01/16/16 0500 01/16/16 0600  BP:  (!) 161/117 (!) 141/58 (!) 144/66  Pulse:   (!) 50 (!) 45  Resp:  14 15 12   Temp: 97.8 F (36.6 C)     TempSrc: Oral     SpO2:  96% 95% 97%  Weight:    187 lb 9.8 oz (85.1 kg)  Height:        Intake/Output from previous day:  Intake/Output Summary (Last 24 hours) at 01/16/16 0714 Last data filed at 01/16/16 0600  Gross per 24 hour  Intake           936.43 ml  Output             2725 ml  Net         -1788.57 ml    Physical Exam: Physical exam: Well-developed well-nourished in no acute distress.  Skin is warm and dry.  HEENT is normal.  Neck is supple.  Chest is clear to auscultation with normal expansion.  Cardiovascular exam is regular rate and rhythm.  Abdominal exam nontender or distended. No masses palpated. Extremities show no edema. neuro grossly intact    Lab Results: Basic Metabolic Panel:  Recent Labs  01/15/16 0450 01/16/16 0304  NA 139 141  K 3.8 4.0  CL 106 105  CO2 26 30  GLUCOSE 118* 112*  BUN 24* 19  CREATININE 1.20 1.11  CALCIUM 9.2 9.4   CBC:  Recent Labs  01/15/16 0017 01/15/16 0450 01/16/16 0304  WBC 7.5 6.5 6.4  NEUTROABS 5.2  --   --   HGB 15.1 13.8 15.2  HCT 45.8 43.2 47.0  MCV 88.6 87.8 88.7  PLT 180 166 181   Cardiac Enzymes:  Recent Labs  01/15/16 0450 01/15/16 0809 01/15/16 1458  TROPONINI 0.04* 0.06* 0.06*     Assessment/Plan:  1 unstable angina-patient presented with chest pain and has ruled out (there is no clear trend with his enzymes). He had a recent abnormal nuclear study. Continue aspirin, heparin and statin. I will not add a beta blocker as he is somewhat bradycardic on telemetry. Proceed with cardiac catheterization. The risks and benefits were discussed including myocardial infarction, CVA and death. He agrees to proceed.  2 peripheral vascular disease-continue aspirin and  statin. 3 hypertension-blood pressure is elevated. Increase amlodipine to 10 mg daily and follow. 4 hyperlipidemia-continue Crestor. Would consider increasing to 40 mg daily as an outpatient. He did not tolerate Lipitor previously.  Kirk Ruths 01/16/2016, 7:14 AM

## 2016-01-16 NOTE — Progress Notes (Signed)
  Echocardiogram 2D Echocardiogram has been performed.  Dillon Young 01/16/2016, 5:31 PM

## 2016-01-17 DIAGNOSIS — N189 Chronic kidney disease, unspecified: Secondary | ICD-10-CM | POA: Diagnosis not present

## 2016-01-17 DIAGNOSIS — I10 Essential (primary) hypertension: Secondary | ICD-10-CM | POA: Diagnosis not present

## 2016-01-17 DIAGNOSIS — E1122 Type 2 diabetes mellitus with diabetic chronic kidney disease: Secondary | ICD-10-CM | POA: Diagnosis not present

## 2016-01-17 DIAGNOSIS — I129 Hypertensive chronic kidney disease with stage 1 through stage 4 chronic kidney disease, or unspecified chronic kidney disease: Secondary | ICD-10-CM | POA: Diagnosis not present

## 2016-01-17 DIAGNOSIS — E785 Hyperlipidemia, unspecified: Secondary | ICD-10-CM | POA: Diagnosis not present

## 2016-01-17 DIAGNOSIS — Z8673 Personal history of transient ischemic attack (TIA), and cerebral infarction without residual deficits: Secondary | ICD-10-CM | POA: Diagnosis not present

## 2016-01-17 DIAGNOSIS — N400 Enlarged prostate without lower urinary tract symptoms: Secondary | ICD-10-CM | POA: Diagnosis not present

## 2016-01-17 DIAGNOSIS — I200 Unstable angina: Secondary | ICD-10-CM | POA: Diagnosis not present

## 2016-01-17 DIAGNOSIS — I25110 Atherosclerotic heart disease of native coronary artery with unstable angina pectoris: Secondary | ICD-10-CM | POA: Diagnosis not present

## 2016-01-17 DIAGNOSIS — I272 Other secondary pulmonary hypertension: Secondary | ICD-10-CM | POA: Diagnosis not present

## 2016-01-17 DIAGNOSIS — K219 Gastro-esophageal reflux disease without esophagitis: Secondary | ICD-10-CM | POA: Diagnosis not present

## 2016-01-17 DIAGNOSIS — Z8546 Personal history of malignant neoplasm of prostate: Secondary | ICD-10-CM | POA: Diagnosis not present

## 2016-01-17 DIAGNOSIS — G4730 Sleep apnea, unspecified: Secondary | ICD-10-CM | POA: Diagnosis not present

## 2016-01-17 DIAGNOSIS — E1151 Type 2 diabetes mellitus with diabetic peripheral angiopathy without gangrene: Secondary | ICD-10-CM | POA: Diagnosis not present

## 2016-01-17 LAB — BASIC METABOLIC PANEL
ANION GAP: 8 (ref 5–15)
BUN: 21 mg/dL — AB (ref 6–20)
CHLORIDE: 107 mmol/L (ref 101–111)
CO2: 24 mmol/L (ref 22–32)
Calcium: 8.9 mg/dL (ref 8.9–10.3)
Creatinine, Ser: 1.28 mg/dL — ABNORMAL HIGH (ref 0.61–1.24)
GFR calc Af Amer: 59 mL/min — ABNORMAL LOW (ref >60)
GFR, EST NON AFRICAN AMERICAN: 51 mL/min — AB (ref >60)
GLUCOSE: 111 mg/dL — AB (ref 65–99)
POTASSIUM: 4.3 mmol/L (ref 3.5–5.1)
Sodium: 139 mmol/L (ref 135–145)

## 2016-01-17 LAB — TROPONIN I
TROPONIN I: 3.06 ng/mL — AB (ref <0.03)
Troponin I: 3.37 ng/mL (ref <0.03)
Troponin I: 3.05 ng/mL (ref <0.03)

## 2016-01-17 LAB — CBC
HEMATOCRIT: 45.2 % (ref 39.0–52.0)
HEMOGLOBIN: 14.6 g/dL (ref 13.0–17.0)
MCH: 28.7 pg (ref 26.0–34.0)
MCHC: 32.3 g/dL (ref 30.0–36.0)
MCV: 89 fL (ref 78.0–100.0)
Platelets: 171 10*3/uL (ref 150–400)
RBC: 5.08 MIL/uL (ref 4.22–5.81)
RDW: 13.3 % (ref 11.5–15.5)
WBC: 8.3 10*3/uL (ref 4.0–10.5)

## 2016-01-17 NOTE — Progress Notes (Signed)
     Discussed with patient that most recent lab trop was elevated at 3.06. Will plan to cycle enzymes. Patient does not currently have any angina or dyspnea. Discussed with MD and will transfer to telemetry and follow enzymes. Elevation likely related to recent PCI with DES to RCA. Possible discharge home tomorrow if enzymes stable.    Reino Bellis NP-C

## 2016-01-17 NOTE — Progress Notes (Signed)
    Subjective:  Denies CP or dyspnea   Objective:  Vitals:   01/17/16 0400 01/17/16 0500 01/17/16 0600 01/17/16 0738  BP: (!) 141/51 (!) 132/40 131/87 (!) 134/43  Pulse: (!) 27 60 (!) 42 73  Resp: 15 15 16 18   Temp: 98.2 F (36.8 C)   97.5 F (36.4 C)  TempSrc: Oral   Oral  SpO2: 95% 97% 94% 100%  Weight:      Height:        Intake/Output from previous day:  Intake/Output Summary (Last 24 hours) at 01/17/16 0800 Last data filed at 01/17/16 0739  Gross per 24 hour  Intake          1076.75 ml  Output              900 ml  Net           176.75 ml    Physical Exam: Physical exam: Well-developed well-nourished in no acute distress.  Skin is warm and dry.  HEENT is normal.  Neck is supple.  Chest is clear to auscultation with normal expansion.  Cardiovascular exam is regular rate and rhythm.  Abdominal exam nontender or distended. No masses palpated. Right groin with no hematoma and no bruit Extremities show no edema. neuro grossly intact    Lab Results: Basic Metabolic Panel:  Recent Labs  01/16/16 0304 01/17/16 0255  NA 141 139  K 4.0 4.3  CL 105 107  CO2 30 24  GLUCOSE 112* 111*  BUN 19 21*  CREATININE 1.11 1.28*  CALCIUM 9.4 8.9   CBC:  Recent Labs  01/15/16 0017  01/16/16 0304 01/17/16 0255  WBC 7.5  < > 6.4 8.3  NEUTROABS 5.2  --   --   --   HGB 15.1  < > 15.2 14.6  HCT 45.8  < > 47.0 45.2  MCV 88.6  < > 88.7 89.0  PLT 180  < > 181 171  < > = values in this interval not displayed. Cardiac Enzymes:  Recent Labs  01/15/16 0450 01/15/16 0809 01/15/16 1458  TROPONINI 0.04* 0.06* 0.06*     Assessment/Plan:  1 unstable angina-patient presented with chest pain and ruled out (there is no clear trend with his enzymes). Cath results noted with PCI of RCA; residual LAD disease; reviewed with Dr [name]; recent nuclear study did not show anterior ischemia. Plan outpt fu for LAD with FFR vs PCI if recurrent symptoms. Given pain last PM, will  check one set of enzymes this AM; if unchanged, will DC later today and fu with Dr [name]. Continue ASA, plavix and statin. 2 peripheral vascular disease-continue aspirin and statin. 3 hypertension-continue present meds and follow. 4 hyperlipidemia-continue Crestor. Would consider increasing to 40 mg daily as an outpatient. He did not tolerate Lipitor previously. > 30 min PA and physician time D2 Kirk Ruths 01/17/2016, 8:00 AM

## 2016-01-17 NOTE — Progress Notes (Signed)
CRITICAL VALUE ALERT  Critical value received: troponin 3.37  Date of notification:  01/17/16  Time of notification:  1617  Critical value read back:No. -n/a did not receive call  Nurse who received alert:  Verlin Grills  MD notified (1st page):  Reino Bellis  Time of first page:  [phone]

## 2016-01-17 NOTE — Progress Notes (Signed)
CRITICAL VALUE ALERT  Critical value received:  Troponin 3.06  Date of notification:  01/17/16  Time of notification:  1220  Critical value read back:Yes.    Nurse who received alert:  Verlin Grills  MD notified (1st page):  [place]  Time of first page:  [phone]

## 2016-01-17 NOTE — Progress Notes (Signed)
CARDIAC REHAB PHASE I   PRE:  Rate/Rhythm: 25 SR with PVCs    BP: sitting 127/55    SaO2: 96 RA  MODE:  Ambulation: 250 ft   POST:  Rate/Rhythm: 87 SR with PVCs    BP: sitting 120/64     SaO2: 97 RA  Pt groin sore but better with distance. No CP, felt well. Numerous PVCs but no real change with walking. Ed completed with pt and family. Voiced understanding and will send referral to [place] [facility]. Understands importance of medicine. [place], ACSM 01/17/2016 11:24 AM

## 2016-01-18 ENCOUNTER — Ambulatory Visit: Payer: PPO | Admitting: Cardiovascular Disease

## 2016-01-18 ENCOUNTER — Telehealth: Payer: Self-pay | Admitting: *Deleted

## 2016-01-18 DIAGNOSIS — I10 Essential (primary) hypertension: Secondary | ICD-10-CM | POA: Diagnosis not present

## 2016-01-18 DIAGNOSIS — R778 Other specified abnormalities of plasma proteins: Secondary | ICD-10-CM | POA: Diagnosis not present

## 2016-01-18 DIAGNOSIS — E785 Hyperlipidemia, unspecified: Secondary | ICD-10-CM | POA: Diagnosis not present

## 2016-01-18 DIAGNOSIS — I200 Unstable angina: Secondary | ICD-10-CM | POA: Diagnosis not present

## 2016-01-18 DIAGNOSIS — R7989 Other specified abnormal findings of blood chemistry: Secondary | ICD-10-CM

## 2016-01-18 LAB — TROPONIN I: Troponin I: 2.86 ng/mL (ref <0.03)

## 2016-01-18 MED ORDER — DOXEPIN HCL 3 MG PO TABS: 1.0000 | ORAL_TABLET | Freq: Every evening | ORAL | Status: DC | PRN

## 2016-01-18 MED ORDER — CLOPIDOGREL BISULFATE 75 MG PO TABS: 75.0000 mg | ORAL_TABLET | Freq: Every day | ORAL | 3 refills | Status: DC

## 2016-01-18 MED ORDER — MELATONIN 5 MG PO CAPS: 1.0000 | ORAL_CAPSULE | Freq: Every day | ORAL | Status: DC | PRN

## 2016-01-18 MED ORDER — AMLODIPINE BESYLATE 10 MG PO TABS: 10.0000 mg | ORAL_TABLET | Freq: Every day | ORAL | 3 refills | Status: DC

## 2016-01-18 MED ORDER — ACETAMINOPHEN 325 MG PO TABS: 650.0000 mg | ORAL_TABLET | ORAL | Status: DC | PRN

## 2016-01-18 NOTE — Progress Notes (Signed)
Pt resting comfortably,denies any discomfort this shift, breathing even and unlabored, will continue to monitor pt

## 2016-01-18 NOTE — Care Management Important Message (Signed)
Important Message  Patient Details  Name: Dillon Young MRN: [id number] Date of Birth: [birth year]   Medicare Important Message Given:  Yes    [name] May 01/18/2016, 11:24 AM

## 2016-01-18 NOTE — Discharge Summary (Signed)
Patient ID: Dillon Young,  MRN: [id number], DOB/AGE: 1932-08-16 82 y.o.  Admit date: 01/14/2016 Discharge date: 01/18/2016  Primary Care Provider: [name], MD Primary Cardiologist: Dr [name]  Discharge Diagnoses Principal Problem:   Unstable angina [facility]) Active Problems:   CAD S/P multiple PCIs   Essential hypertension   Diabetes mellitus without complication (HCC)   Troponin level elevated-post PCI    Hyperlipidemia with target LDL less than 70   Bradycardia    Procedures: Cath/ RCA PCI 01/17/16                        Echo 01/16/16   Hospital Course:  82 year old male followed by Dr [name] with a history of CAD. 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 He has a history of angioplasty in 1994 and 1995 of the circumflex coronary artery. He has not required cardiac catheterization since.  Myoview in 2011 was low risk and nonischemic. He has a history of hypertension and hyperlipidemia. He was intolerant to Lipitor in the past but is tolerating Crestor. He had right carotid endarterectomy performed by Dr. Curt Jews June 2013. He saw Dr [name] in the office 01/04/16 and complained of chest pain. A Myoview done as an OP 01/09/16 was abnormal with a reversible, medium sized, moderate-intensity basal inferolateral and basal to mid inferior perfusion defect. That was concerning for ischemia. He was to follow up as an OP but presented to the ED 01/14/16 with chest pain. He was admitted and ruled out by Troponin. Echo 01/16/16 showed an ejection fraction in the range of 50% to 55% with basal inferior  akinesis. Cath was done 01/16/16 and revealed a 90% mRCA stenosis that was treated with a DES. He also had residual 70% mLAD and a 90% small RI.  Post PCI he developed chest pain and had a rise in his Troponin to 3.37. This was felt to be secondary to distal embolization from PCI. Dr Stanford Breed saw the pt the morning of the 23'd and felt he could be discharged. He  was pain free at discharge. He is not on a beta blocker secondary to bradycardia. He is not on a higher dose of statin Rx secondary to his history of statin intolerance in the past.    Discharge Vitals:  Blood pressure (!) 148/55, pulse 72, temperature 98.5 F (36.9 C), temperature source Oral, resp. rate 18, height 5\' 9"  (1.753 m), weight 189 lb 1.6 oz (85.8 kg), SpO2 96 %.    Labs: Results for orders placed or performed during the hospital encounter of 01/14/16 (from the past 24 hour(s))  Troponin I     Status: Abnormal   Collection Time: 01/17/16 11:26 AM  Result Value Ref Range   Troponin I 3.06 (HH) <0.03 ng/mL  Troponin I (q 6hr x 3)     Status: Abnormal   Collection Time: 01/17/16  3:25 PM  Result Value Ref Range   Troponin I 3.37 (HH) <0.03 ng/mL  Troponin I (q 6hr x 3)     Status: Abnormal   Collection Time: 01/17/16 10:22 PM  Result Value Ref Range   Troponin I 3.05 (HH) <0.03 ng/mL  Troponin I (q 6hr x 3)     Status: Abnormal   Collection Time: 01/18/16  3:56 AM  Result Value Ref Range   Troponin I 2.86 (HH) <0.03 ng/mL    Disposition:  Follow-up Information    [name],  MD .   Specialties:  Cardiology, Radiology Why:  office will conatct you for follow up with Dr Kennon Holter APP Contact information: [address] [place] [address] [phone]           Discharge Medications:    Medication List    STOP taking these medications   metoprolol succinate 25 MG 24 hr tablet Commonly known as:  TOPROL-XL     TAKE these medications   acetaminophen 325 MG tablet Commonly known as:  TYLENOL Take 2 tablets (650 mg total) by mouth every 4 (four) hours as needed for headache or mild pain.   amLODipine 10 MG tablet Commonly known as:  NORVASC Take 1 tablet (10 mg total) by mouth daily. What changed:  medication strength  how much to take   aspirin EC 81 MG tablet Take 81 mg by mouth every evening.   BENEFIBER Powd 2 tablespoons  a day What changed:  how much to take  how to take this  when to take this  additional instructions   cholecalciferol 1000 units tablet Commonly known as:  VITAMIN D Take 1,000 Units by mouth daily.   clopidogrel 75 MG tablet Commonly known as:  PLAVIX Take 1 tablet (75 mg total) by mouth daily with breakfast.   dicyclomine 20 MG tablet Commonly known as:  BENTYL Take 1 tablet (20 mg total) by mouth every 6 (six) hours as needed for spasms.   Doxepin HCl 3 MG Tabs Commonly known as:  SILENOR Take 1 tablet (3 mg total) by mouth at bedtime as needed (sleep).   fluticasone 50 MCG/ACT nasal spray Commonly known as:  FLONASE Place 2 sprays into both nostrils as needed. What changed:  how much to take  when to take this  reasons to take this   hydrocortisone 2.5 % rectal cream Commonly known as:  ANUSOL-HC Place 1 application rectally 2 (two) times daily as needed for hemorrhoids.   irbesartan-hydrochlorothiazide 300-12.5 MG tablet Commonly known as:  AVALIDE Take 1 tablet by mouth daily.   Melatonin 5 MG Caps Take 1 capsule (5 mg total) by mouth daily as needed (sleep).   nitroGLYCERIN 0.4 MG SL tablet Commonly known as:  NITROSTAT Place 1 tablet (0.4 mg total) under the tongue every 5 (five) minutes as needed for chest pain.   polyethylene glycol powder powder Commonly known as:  GLYCOLAX/MIRALAX Take 17 g by mouth as needed. What changed:  when to take this  reasons to take this   rosuvastatin 10 MG tablet Commonly known as:  CRESTOR Take 1 tablet (10 mg total) by mouth daily.   vitamin C 500 MG tablet Commonly known as:  ASCORBIC ACID Take 500 mg by mouth daily.        Duration of Discharge Encounter: Greater than 30 minutes including physician time.  Angelena Form PA-C 01/18/2016 9:33 AM

## 2016-01-18 NOTE — Progress Notes (Signed)
    Subjective:  Denies CP or dyspnea   Objective:  Vitals:   01/17/16 1600 01/17/16 1634 01/17/16 2017 01/18/16 0500  BP: (!) 129/54 (!) 113/49 (!) 131/57 (!) 148/55  Pulse: (!) 43 64 69 72  Resp: 19 18 18 18   Temp:  98.4 F (36.9 C) 98.4 F (36.9 C) 98.5 F (36.9 C)  TempSrc:  Oral Oral Oral  SpO2: 95% 98% 95% 96%  Weight:    189 lb 1.6 oz (85.8 kg)  Height:        Intake/Output from previous day:  Intake/Output Summary (Last 24 hours) at 01/18/16 0825 Last data filed at 01/18/16 0000  Gross per 24 hour  Intake              480 ml  Output              800 ml  Net             -320 ml    Physical Exam: Physical exam: Well-developed well-nourished in no acute distress.  Skin is warm and dry.  HEENT is normal.  Neck is supple.  Chest is clear to auscultation with normal expansion.  Cardiovascular exam is regular rate and rhythm.  Abdominal exam nontender or distended. No masses palpated. Right groin with no hematoma and no bruit Extremities show no edema. neuro grossly intact    Lab Results: Basic Metabolic Panel:  Recent Labs  01/16/16 0304 01/17/16 0255  NA 141 139  K 4.0 4.3  CL 105 107  CO2 30 24  GLUCOSE 112* 111*  BUN 19 21*  CREATININE 1.11 1.28*  CALCIUM 9.4 8.9   CBC:  Recent Labs  01/16/16 0304 01/17/16 0255  WBC 6.4 8.3  HGB 15.2 14.6  HCT 47.0 45.2  MCV 88.7 89.0  PLT 181 171   Cardiac Enzymes:  Recent Labs  01/17/16 1525 01/17/16 2222 01/18/16 0356  TROPONINI 3.37* 3.05* 2.86*     Assessment/Plan:  1 unstable angina-patient presented with chest pain and ruled out (there is no clear trend with his enzymes). Cath results noted with PCI of RCA; residual LAD disease; reviewed with Dr [name]; recent nuclear study did not show anterior ischemia. Plan outpt fu for LAD with FFR vs PCI if recurrent symptoms. Given pain last PM, will check one set of enzymes this AM; if unchanged, will DC later today and fu with Dr [name]. Continue  ASA, plavix and statin. 2 peripheral vascular disease-continue aspirin and statin. 3 hypertension-continue present meds and follow. 4 hyperlipidemia-continue Crestor. Would consider increasing to 40 mg daily as an outpatient. He did not tolerate Lipitor previously. > 30 min PA and physician time D2 Kirk Ruths 01/18/2016, 8:25 AM

## 2016-01-18 NOTE — Telephone Encounter (Signed)
Pt was on TCM list admitted for  Unstable angina ([place]. Pt D/C 01/18/16 will be f/u w/Dr. [name] with cardiology...Johny Chess

## 2016-01-18 NOTE — Progress Notes (Signed)
    Subjective:  Denies CP or dyspnea   Objective:  Vitals:   01/17/16 1600 01/17/16 1634 01/17/16 2017 01/18/16 0500  BP: (!) 129/54 (!) 113/49 (!) 131/57 (!) 148/55  Pulse: (!) 43 64 69 72  Resp: 19 18 18 18   Temp:  98.4 F (36.9 C) 98.4 F (36.9 C) 98.5 F (36.9 C)  TempSrc:  Oral Oral Oral  SpO2: 95% 98% 95% 96%  Weight:    189 lb 1.6 oz (85.8 kg)  Height:        Intake/Output from previous day:  Intake/Output Summary (Last 24 hours) at 01/18/16 0841 Last data filed at 01/18/16 0835  Gross per 24 hour  Intake              720 ml  Output              950 ml  Net             -230 ml    Physical Exam: Physical exam: Well-developed well-nourished in no acute distress.  Skin is warm and dry.  HEENT is normal.  Neck is supple.  Chest is clear to auscultation with normal expansion.  Cardiovascular exam is regular rate and rhythm.  Abdominal exam nontender or distended. No masses palpated. Right groin with no hematoma and no bruit Extremities show no edema. neuro grossly intact    Lab Results: Basic Metabolic Panel:  Recent Labs  01/16/16 0304 01/17/16 0255  NA 141 139  K 4.0 4.3  CL 105 107  CO2 30 24  GLUCOSE 112* 111*  BUN 19 21*  CREATININE 1.11 1.28*  CALCIUM 9.4 8.9   CBC:  Recent Labs  01/16/16 0304 01/17/16 0255  WBC 6.4 8.3  HGB 15.2 14.6  HCT 47.0 45.2  MCV 88.7 89.0  PLT 181 171   Cardiac Enzymes:  Recent Labs  01/17/16 1525 01/17/16 2222 01/18/16 0356  TROPONINI 3.37* 3.05* 2.86*     Assessment/Plan:  1 unstable angina-S/P PCI of RCA; small increase in troponin following procedure likely related to distal embolization at time of procedure; no further pain; residual LAD disease; reviewed previously with Dr [name]; recent nuclear study did not show anterior ischemia. Plan outpt fu for LAD with FFR vs PCI if recurrent symptoms. Continue ASA, plavix and statin. 2 peripheral vascular disease-continue aspirin and statin. 3  hypertension-continue present meds and follow. 4 hyperlipidemia-continue Crestor. Would consider increasing to 40 mg daily as an outpatient. He did not tolerate Lipitor previously. > 30 min PA and physician time D2 Kirk Ruths 01/18/2016, 8:41 AM

## 2016-01-19 ENCOUNTER — Telehealth: Payer: Self-pay | Admitting: Cardiovascular Disease

## 2016-01-19 MED FILL — Verapamil HCl IV Soln 2.5 MG/ML: INTRAVENOUS | Qty: 2 | Status: AC

## 2016-01-19 MED FILL — Nitroglycerin IV Soln 100 MCG/ML in D5W: INTRA_ARTERIAL | Qty: 10 | Status: AC

## 2016-01-19 NOTE — Telephone Encounter (Signed)
TOC Phone call .Marland Kitchen Appt On 01/26/16 appt is 8am - Pharmacist Visit and  Kerin Ransom at 8:45am . Please call    Thanks

## 2016-01-19 NOTE — Telephone Encounter (Signed)
Patient contacted regarding discharge from [facility] on 01/18/16.   Patient understands to follow up with provider Kerin Ransom PA on 8/31 at 845 at [place] as well as with pharmacist on 8/31 at [place] at [facility] location.  Patient understands discharge instructions? yes  Patient understands medications and regiment? yes  Patient understands to bring all medications to this visit? yes   Pt denies further questions and/or concerns at this time.

## 2016-01-20 MED ORDER — FLUTICASONE PROPIONATE 50 MCG/ACT NA SUSP: 1.0000 | Freq: Every day | NASAL | 1 refills | Status: DC | PRN

## 2016-01-20 NOTE — Telephone Encounter (Signed)
90 day erx sent. Pt has been dc'ed and med has not been dc'ed.

## 2016-01-25 ENCOUNTER — Other Ambulatory Visit: Payer: Self-pay

## 2016-01-25 NOTE — Patient Outreach (Addendum)
Dillon Young) Care Management  01/25/2016  Dillon Young 1933-02-22 [id number]   Referral Date:  01/20/16 Source: [place] Issue:  "Ongoing education/disease and symptom management.  (TRIGGER 8/24) High Risk for Readmission - Angina, cardiac cath s/p PCI." Patient is eligible for [facility] services.  Primary MD:  Dr. Scarlette Young Insurance:  HTA  Subjective:   Outreach call #1 to patient.  Patient reached and screening completed.  Patient states Dillon Young from [employer] contacted him this morning.  States doing well since discharge and has follow-up with Cardiologist tomorrow.  Confirms medications modified on discharge and has no questions regarding medications.    Providers: Primary MD:  Dr. Scarlette Young Cardiologist: Dr. Gwenlyn Young - next appt 01/26/16 Opthalmologist:  Last appt 05/2015 HH: none  Social: Patient lives in his home with his wife.   Mobility: Ambulates with no assistive devices.  Falls: none Pain: none Depression: none Transportation:  self Caregiver: wife Emergency Contact: wife Advance Directive: yes Consent:  yes DME: BP cuff, Scales, eyeglasses  Co-morbidities:  Essential HTN, DM, hyperlipidemia with target LDL less than 70, EF 50-55% on 01/16/16 ECHO Admissions: 1 01/14/16 - 01/18/16 unstable angina ER visits: 0  Medications:  Patient taking less than 15 medications  Co-pay cost issues: none Flu Vaccine: 02/2015  Medication Reconciliation completed with patient 01/25/16 Recent change from Simvastatin to Crestor.  States past issues with cholesterol medication but will try the new medicine.   States Amlodipine dosage increased on last hospital discharge.     Objective:   Encounter Medications:  Outpatient Encounter Prescriptions as of 01/25/2016  Medication Sig  . acetaminophen (TYLENOL) 325 MG tablet Take 2 tablets (650 mg total) by mouth every 4 (four) hours as needed for headache or mild pain.  Marland Kitchen amLODipine (NORVASC) 10 MG tablet Take 1 tablet  (10 mg total) by mouth daily.  Marland Kitchen aspirin EC 81 MG tablet Take 81 mg by mouth every evening.   . cholecalciferol (VITAMIN D) 1000 UNITS tablet Take 1,000 Units by mouth daily.    . clopidogrel (PLAVIX) 75 MG tablet Take 1 tablet (75 mg total) by mouth daily with breakfast.  . dicyclomine (BENTYL) 20 MG tablet Take 1 tablet (20 mg total) by mouth every 6 (six) hours as needed for spasms.  . Doxepin HCl (SILENOR) 3 MG TABS Take 1 tablet (3 mg total) by mouth at bedtime as needed (sleep).  . fluticasone (FLONASE) 50 MCG/ACT nasal spray Place 1-2 sprays into both nostrils daily as needed for allergies.  . hydrocortisone (ANUSOL-HC) 2.5 % rectal cream Place 1 application rectally 2 (two) times daily as needed for hemorrhoids.   . irbesartan-hydrochlorothiazide (AVALIDE) 300-12.5 MG tablet Take 1 tablet by mouth daily.  . Melatonin 5 MG CAPS Take 1 capsule (5 mg total) by mouth daily as needed (sleep).  . nitroGLYCERIN (NITROSTAT) 0.4 MG SL tablet Place 1 tablet (0.4 mg total) under the tongue every 5 (five) minutes as needed for chest pain.  . polyethylene glycol powder (GLYCOLAX/MIRALAX) powder Take 17 g by mouth as needed. (Patient taking differently: Take 17 g by mouth daily as needed for mild constipation. )  . rosuvastatin (CRESTOR) 10 MG tablet Take 1 tablet (10 mg total) by mouth daily.  . vitamin C (ASCORBIC ACID) 500 MG tablet Take 500 mg by mouth daily.   . Wheat Dextrin (BENEFIBER) POWD 2 tablespoons a day (Patient taking differently: Take 10 mLs by mouth daily. )   No facility-administered encounter medications on file as of 01/25/2016.  Functional Status:  In your present state of health, do you have any difficulty performing the following activities: 01/25/2016 01/17/2016  Hearing? Y N  Vision? N N  Difficulty concentrating or making decisions? N N  Walking or climbing stairs? N N  Dressing or bathing? N N  Doing errands, shopping? N -  Preparing Food and eating ? N -  Using the  Toilet? N -  In the past six months, have you accidently leaked urine? N -  Do you have problems with loss of bowel control? N -  Managing your Medications? N -  Managing your Finances? N -  Housekeeping or managing your Housekeeping? N -  Some recent data might be hidden    Fall/Depression Screening: PHQ 2/9 Scores 01/25/2016 01/25/2016 08/16/2015 08/15/2015  PHQ - 2 Score 0 0 0 0    Fall Risk  01/25/2016 08/16/2015 08/15/2015 07/05/2014  Falls in the past year? No No No No     Preventives: Hearing:  Testing 12/2015 and in process of getting bilateral hearing aids.  patient does not know if he has a hearing aide benefit with his insurance / HTA.  Eyes: 05/2015    Assessment/Plan:  [facility] RN CM Referral  -multiple co-morbidities -Hospital admission within the past 30 days.  -Hearing Aids Benefit research Audiological scientist).  This RN Radio producer will follow-up.  -Advance Directive complete but not on file.   RN CM advised patient in next [facility] scheduled contact call within the next 10 business days.   RN CM advised to please notify MD of any changes in condition prior to scheduled appt's.   RN CM provided contact name and # [phone] or main office # [phone] and 24-hour nurse line # [phone].  RN CM confirmed patient is aware of 911 services for urgent emergency needs.  Dillon Young, BSN, RN, [place] Care Management Care Management Coordinator [phone] Direct [phone] Cell [phone] Office [phone] Fax [email or web address]

## 2016-01-26 ENCOUNTER — Other Ambulatory Visit: Payer: Self-pay

## 2016-01-26 ENCOUNTER — Encounter: Payer: Self-pay | Admitting: Cardiology

## 2016-01-26 ENCOUNTER — Encounter: Payer: Self-pay | Admitting: Pharmacist Clinician (PhC)/ Clinical Pharmacy Specialist

## 2016-01-26 ENCOUNTER — Encounter: Payer: Self-pay | Admitting: *Deleted

## 2016-01-26 ENCOUNTER — Telehealth: Payer: Self-pay | Admitting: Cardiovascular Disease

## 2016-01-26 ENCOUNTER — Ambulatory Visit (INDEPENDENT_AMBULATORY_CARE_PROVIDER_SITE_OTHER): Payer: PPO | Admitting: Pharmacist Clinician (PhC)/ Clinical Pharmacy Specialist

## 2016-01-26 ENCOUNTER — Ambulatory Visit (INDEPENDENT_AMBULATORY_CARE_PROVIDER_SITE_OTHER): Payer: PPO | Admitting: Cardiology

## 2016-01-26 VITALS — BP 120/66 | HR 55 | Ht 69.0 in | Wt 190.8 lb

## 2016-01-26 DIAGNOSIS — I6523 Occlusion and stenosis of bilateral carotid arteries: Secondary | ICD-10-CM

## 2016-01-26 DIAGNOSIS — E785 Hyperlipidemia, unspecified: Secondary | ICD-10-CM

## 2016-01-26 DIAGNOSIS — Z9861 Coronary angioplasty status: Secondary | ICD-10-CM

## 2016-01-26 DIAGNOSIS — I2510 Atherosclerotic heart disease of native coronary artery without angina pectoris: Secondary | ICD-10-CM

## 2016-01-26 DIAGNOSIS — I493 Ventricular premature depolarization: Secondary | ICD-10-CM | POA: Diagnosis not present

## 2016-01-26 DIAGNOSIS — I10 Essential (primary) hypertension: Secondary | ICD-10-CM

## 2016-01-26 NOTE — Progress Notes (Signed)
Patient ID: FLORINDO CASIMIRO                 DOB: 1932-06-04                      MRN: [id number]    Pharmacy Transitions of Care Visit  HPI: Dillon Young is a 82 y.o. male discharged on 01/18/16 with primary diagnosis of unstable angina.  Patient presents to clinic for pharmacy transitions of care medication reconciliation after hospital discharge.  Past medical history is listed below.   Today patient reports feeling well overall, but does describe pain in his right arm that will last for several minutes, then become a dull ache for some time afterward.  Has been happening regularly since being discharged.  Only significant change in medications has been the switch from simvastatin 40 mg to rosuvastatin 10 mg (switched because of interaction with amlodipine).  He had previous myalgias with atorvastatin, but had tolerated simvastatin without complaint.  All medications have been reviewed with patient.  Issues/concerns noted are as follows: patient not on beta blocker due to low heart rate as noted on discharge summary.     Past Medical History:  Diagnosis Date  . Adenomatous colon polyp   . Allergic rhinitis   . BPH (benign prostatic hyperplasia)   . CAD (coronary artery disease)    sees Dr. [name]   . Carotid artery disease ([place])   . CHF (congestive heart failure) ([place])   . Chronic kidney disease    [place]. Prostate cancer  . Clotting disorder ([place])   . CVA (cerebral infarction)   . Diabetes mellitus    "Borderline"  . Diverticulitis   . GERD (gastroesophageal reflux disease)    history  . Hemorrhoids   . HTN (hypertension)   . Hyperlipidemia    echo 10-25-2010 mild abnormalities  . IBS (irritable bowel syndrome)   . Low back pain   . Prostate cancer ([place]) 2008   seed implantation  . Sleep apnea    last study was aborted.  Has never worn a CPAP.    Marland Kitchen Smoker     Current Outpatient Prescriptions on File Prior to Visit  Medication Sig Dispense Refill  .  acetaminophen (TYLENOL) 325 MG tablet Take 2 tablets (650 mg total) by mouth every 4 (four) hours as needed for headache or mild pain.    Marland Kitchen amLODipine (NORVASC) 10 MG tablet Take 1 tablet (10 mg total) by mouth daily. 90 tablet 3  . aspirin EC 81 MG tablet Take 81 mg by mouth every evening.     . cholecalciferol (VITAMIN D) 1000 UNITS tablet Take 1,000 Units by mouth daily.      . clopidogrel (PLAVIX) 75 MG tablet Take 1 tablet (75 mg total) by mouth daily with breakfast. 90 tablet 3  . dicyclomine (BENTYL) 20 MG tablet Take 1 tablet (20 mg total) by mouth every 6 (six) hours as needed for spasms. 30 tablet 5  . Doxepin HCl (SILENOR) 3 MG TABS Take 1 tablet (3 mg total) by mouth at bedtime as needed (sleep).    . fluticasone (FLONASE) 50 MCG/ACT nasal spray Place 1-2 sprays into both nostrils daily as needed for allergies. 48 g 1  . hydrocortisone (ANUSOL-HC) 2.5 % rectal cream Place 1 application rectally 2 (two) times daily as needed for hemorrhoids.     . irbesartan-hydrochlorothiazide (AVALIDE) 300-12.5 MG tablet Take 1 tablet by mouth daily. 90 tablet 3  . Melatonin  5 MG CAPS Take 1 capsule (5 mg total) by mouth daily as needed (sleep).    . nitroGLYCERIN (NITROSTAT) 0.4 MG SL tablet Place 1 tablet (0.4 mg total) under the tongue every 5 (five) minutes as needed for chest pain. 25 tablet 3  . polyethylene glycol powder (GLYCOLAX/MIRALAX) powder Take 17 g by mouth as needed. (Patient taking differently: Take 17 g by mouth daily as needed for mild constipation. ) 255 g 3  . rosuvastatin (CRESTOR) 10 MG tablet Take 1 tablet (10 mg total) by mouth daily. 90 tablet 3  . vitamin C (ASCORBIC ACID) 500 MG tablet Take 500 mg by mouth daily.     . Wheat Dextrin (BENEFIBER) POWD 2 tablespoons a day (Patient taking differently: Take 10 mLs by mouth daily. )     No current facility-administered medications on file prior to visit.     Allergies  Allergen Reactions  . Lipitor [Atorvastatin] Other (See  Comments)    myalgias    Tommy Medal PharmD CPP [place] [facility]

## 2016-01-26 NOTE — Assessment & Plan Note (Signed)
Will have patient decrease rosuvastatin to 5 mg daily for a couple of weeks to see if arm pain resolves.  Once that does, he can probably re-challenge with 10 mg dose.    Medications reviewed and all questions answered.

## 2016-01-26 NOTE — Patient Instructions (Signed)
Medication Change  Take Rosuvastatin (Crestor) 5 mg (1/2 tab) daily.    Follow-up  Your physician recommends that you schedule a follow-up appointment in: 2 months with Dr. [name].  If you need a refill on your cardiac medications before your next appointment, please call your pharmacy.

## 2016-01-26 NOTE — Telephone Encounter (Signed)
Routed to [employer] PA for approval for patient to return to work.

## 2016-01-26 NOTE — Telephone Encounter (Signed)
Returned call to patient-made aware of Kerin Ransom PA-C recommendations.    Erlene Quan, PA-C  01/26/16 3:27 PM  Note    OK to return to work tomorrow if he has to. I would prefer Tuesday but tomorrow is OK. No heavy lifting, otherwise no restrictions.   Kerin Ransom PA-C 01/26/2016 3:26 PM       Letter created and placed at front desk for pt to pick up.  Pt verbalized understanding and will pick up letter tomorrow.

## 2016-01-26 NOTE — Patient Instructions (Addendum)
Medications for a Healthy Heart        My medicines help me. Live longer Feel better Stay out of the hospital      My prescription  Beta blocker    Not taking - caused your heart to beat too slow  Beta blockers make your heart strong by blocking chemicals that make your heart work too hard. They may cause a slow heartbeat and low blood pressure.   ACE inhibitor/ARB    Irbesartan/hctz 300/12.5 mg  ACE inhibitors or ARBs will help to keep your blood pressure lower and to reduce strain on the heart. Call your doctor if you experience a dry cough or have lip/face tingling or swelling.  Statin     Decrease your rosuvastatin (Crestor) to 5 mg once daily  Statins will help to lower your cholesterol and prevent the build-up of plaque in your arteries that can lead to another heart attack and cause heart disease. Let your doctor know if you feel any muscle aches or pains.  Aspirin     Enteric coated 81 mg daily  Aspirin helps to prevent future heart attacks and heart disease. Because it is an anti-platelet drug, it makes the blood thinner and may cause bleeds. Let your doctor know if it looks like there is blood in your stool or if you have any upcoming procedures or surgeries.  Anti-platelet      Clopidogrel 75 mg  You may be on another anti-platelet drug for at least a year after your heart attack. It works with aspirin to keep your blood thin and prevent future heart attacks. It may also cause bleeding-let your doctor know if  it looks like you have blood in your stool or if you have any upcoming procedures or surgeries.  Nitroglycerin    Nitrostat 0.4 mg   Nitroglycerin should only be used as needed if you experience chest pain at home. Call the hospital if you need to take more than 1 nitroglycerin. It may cause dizziness and headache.    And remember.  ? Cardiac rehab, exercise, and quitting smoking are some of the best ways to make your heart stronger and help you to live longer. ? Call  us if you are interested in counseling or medications to help you quit smoking.

## 2016-01-26 NOTE — Telephone Encounter (Signed)
OK to return to work tomorrow if he has to. I would prefer Tuesday but tomorrow is OK. No heavy lifting, otherwise no restrictions.   Kerin Ransom PA-C 01/26/2016 3:26 PM

## 2016-01-26 NOTE — Progress Notes (Signed)
Cardiology Office Note    Date:  01/26/2016   ID:  Birney, Salud 15-Feb-1933, MRN [id number]  PCP:  [name], MD  Cardiologist:  Dr.  [name]  Chief Complaint: Hospital follow up s/p  PCI  History of Present Illness:   Dillon Young is a 83 y.o. male CAD, carotid artery diesase, BPH, HLD, HTN,PVCSm, bradycardia (off BB) and recent PCI who presented for TCM.   Hx of angioplasty in 1994 and [place] his circumflex coronary artery. Myoview in 2011 was low risk and nonischemic.  Myoview done as an OP 01/09/16 for chest pain was abnormal with a reversible, medium sized, moderate-intensity basal inferolateral and basal to mid inferior perfusion defect. That was concerning for ischemia. He was to follow up as an OP but presented to the ED 01/14/16 with chest pain.  He was admitted and ruled out by Troponin. Echo 01/16/16 showed an ejection fraction in the range of 50% to 55% with basal inferior akinesis. Cath was done 01/16/16 and revealed a 90% mRCA stenosis that was treated with a DES. He also had residual 70% mLAD and a 90% small RI.  Post PCI he developed chest pain and had a rise in his Troponin to 3.37. This was felt to be secondary to distal embolization from PCI. Discharged stably 01/18/16. He is not on a beta blocker secondary to bradycardia. He is not on a higher dose of statin Rx secondary to his history of statin intolerance in the past.   He had right carotid endarterectomy performed by Dr. Curt Jews June 2013.   Presents today for follow up. Denies exertional chest pain or dyspnea. Complains intermittent sharp pain starting from R  shoulder and radiating to arm. Hard to lift. Thing likely due to recliner pulling and laying on R side. Cath done R femoral site. No dizziness, palpitations, LE edema. Syncope, melena.    Past Medical History:  Diagnosis Date  . Adenomatous colon polyp   . Allergic rhinitis   . BPH (benign prostatic hyperplasia)   . CAD (coronary artery disease)     sees Dr. [name]   . Carotid artery disease ([place])   . CHF (congestive heart failure) ([place])   . Chronic kidney disease    [place]. Prostate cancer  . Clotting disorder ([place])   . CVA (cerebral infarction)   . Diabetes mellitus    "Borderline"  . Diverticulitis   . GERD (gastroesophageal reflux disease)    history  . Hemorrhoids   . HTN (hypertension)   . Hyperlipidemia    echo 10-25-2010 mild abnormalities  . IBS (irritable bowel syndrome)   . Low back pain   . Prostate cancer ([place]) 2008   seed implantation  . Sleep apnea    last study was aborted.  Has never worn a CPAP.    Marland Kitchen Smoker     Past Surgical History:  Procedure Laterality Date  . APPENDECTOMY    . CARDIAC CATHETERIZATION N/A 01/16/2016   Procedure: Left Heart Cath and Coronary Angiography;  Surgeon: Jettie Booze, MD;  Location: [facility];  Service: Cardiovascular;  Laterality: N/A;  . CARDIAC CATHETERIZATION N/A 01/16/2016   Procedure: Coronary Stent Intervention;  Surgeon: Jettie Booze, MD;  Location: [facility];  Service: Cardiovascular;  Laterality: N/A;  . Cataract with Lens     Bilateral  . COLONOSCOPY  10/21/2008   diverticulosis, radiation proctitis, internal hemorrhoids  . ENDARTERECTOMY  11/12/2011   Procedure: ENDARTERECTOMY  CAROTID;  Surgeon: Rosetta Posner, MD;  Location: [facility];  Service: Vascular;  Laterality: Right;  . Esophageal dilitation    . HEMORRHOID BANDING    . Lithrotripsy      x 3  . LUMBAR LAMINECTOMY    . TRANSURETHRAL RESECTION OF PROSTATE  10/2003    Current Medications: Prior to Admission medications   Medication Sig Start Date End Date Taking? Authorizing Provider  acetaminophen (TYLENOL) 325 MG tablet Take 2 tablets (650 mg total) by mouth every 4 (four) hours as needed for headache or mild pain. 01/18/16  Yes Luke K Kilroy, PA-C  amLODipine (NORVASC) 10 MG tablet Take 1 tablet (10 mg total) by mouth daily. 01/18/16  Yes Erlene Quan, PA-C    aspirin EC 81 MG tablet Take 81 mg by mouth every evening.    Yes Historical Provider, MD  cholecalciferol (VITAMIN D) 1000 UNITS tablet Take 1,000 Units by mouth daily.     Yes Historical Provider, MD  clopidogrel (PLAVIX) 75 MG tablet Take 1 tablet (75 mg total) by mouth daily with breakfast. 01/18/16  Yes Doreene Burke Kilroy, PA-C  dicyclomine (BENTYL) 20 MG tablet Take 1 tablet (20 mg total) by mouth every 6 (six) hours as needed for spasms. 01/24/15  Yes Gatha Mayer, MD  Doxepin HCl (SILENOR) 3 MG TABS Take 1 tablet (3 mg total) by mouth at bedtime as needed (sleep). 01/18/16  Yes Luke K Kilroy, PA-C  fluticasone (FLONASE) 50 MCG/ACT nasal spray Place 1-2 sprays into both nostrils daily as needed for allergies. 01/20/16  Yes [name], MD  hydrocortisone (ANUSOL-HC) 2.5 % rectal cream Place 1 application rectally 2 (two) times daily as needed for hemorrhoids.    Yes Historical Provider, MD  irbesartan-hydrochlorothiazide (AVALIDE) 300-12.5 MG tablet Take 1 tablet by mouth daily. 09/02/15  Yes [name], MD  Melatonin 5 MG CAPS Take 1 capsule (5 mg total) by mouth daily as needed (sleep). 01/18/16  Yes Luke K Kilroy, PA-C  nitroGLYCERIN (NITROSTAT) 0.4 MG SL tablet Place 1 tablet (0.4 mg total) under the tongue every 5 (five) minutes as needed for chest pain. 01/04/16  Yes Isaiah Serge, NP  polyethylene glycol powder (GLYCOLAX/MIRALAX) powder Take 17 g by mouth as needed. Patient taking differently: Take 17 g by mouth daily as needed for mild constipation.  10/07/15  Yes Gatha Mayer, MD  rosuvastatin (CRESTOR) 10 MG tablet Take 1 tablet (10 mg total) by mouth daily. 01/04/16 04/03/16 Yes Isaiah Serge, NP  vitamin C (ASCORBIC ACID) 500 MG tablet Take 500 mg by mouth daily.    Yes Historical Provider, MD  Wheat Dextrin (BENEFIBER) POWD 2 tablespoons a day Patient taking differently: Take 10 mLs by mouth daily.  10/07/15  Yes Gatha Mayer, MD    Allergies:   Lipitor [atorvastatin]   Social  History   Social History  . Marital status: Married    Spouse name: N/A  . Number of children: 2  . Years of education: N/A   Occupational History  . [employer]    Social History Main Topics  . Smoking status: Former Smoker    Packs/day: 1.50    Years: 60.00    Types: Cigarettes    Quit date: 07/25/2008  . Smokeless tobacco: Never Used  . Alcohol use 0.0 oz/week     Comment: Occ  . Drug use: No  . Sexual activity: Not Asked   Other Topics Concern  . None  Social History Narrative  . None     Family History:  The patient's family history includes Heart disease in his father.   ROS:   Please see the history of present illness.    ROS All other systems reviewed and are negative.   PHYSICAL EXAM:   VS:  BP 120/66 (BP Location: Left Arm, Patient Position: Sitting, Cuff Size: Normal)   Pulse (!) 55   Ht 5\' 9"  (1.753 m)   Wt 190 lb 12.8 oz (86.5 kg)   BMI 28.18 kg/m    GEN: Well nourished, well developed, in no acute distress  HEENT: normal  Neck: no JVD, carotid bruits, or masses Cardiac: irregular ; no murmurs, rubs, or gallops,no edema. R femoral site stable.  Respiratory:  clear to auscultation bilaterally, normal work of breathing GI: soft, nontender, nondistended, + BS MS: no deformity or atrophy  Skin: warm and dry, no rash Neuro:  Alert and Oriented x 3, Strength and sensation are intact Psych: euthymic mood, full affect  Wt Readings from Last 3 Encounters:  01/26/16 190 lb 12.8 oz (86.5 kg)  01/18/16 189 lb 1.6 oz (85.8 kg)  01/10/16 194 lb 4 oz (88.1 kg)      Studies/Labs Reviewed:   EKG:  EKG is ordered today.  The ekg ordered today demonstrates sinus rhythm with quadrigeminy. HR 64  Recent Labs: 07/19/2015: ALT 17 12/15/2015: TSH 1.63 01/17/2016: BUN 21; Creatinine, Ser 1.28; Hemoglobin 14.6; Platelets 171; Potassium 4.3; Sodium 139   Lipid Panel    Component Value Date/Time   CHOL 107 01/15/2016 0454   TRIG 102 01/15/2016 0454    HDL 35 (L) 01/15/2016 0454   CHOLHDL 3.1 01/15/2016 0454   VLDL 20 01/15/2016 0454   LDLCALC 52 01/15/2016 0454    Additional studies/ records that were reviewed today include:   Echocardiogram:01/16/16 LV EF: 50% -   55%  ------------------------------------------------------------------- Indications:      Chest pain 786.51.  ------------------------------------------------------------------- Study Conclusions  - Left ventricle: The cavity size was mildly dilated. Wall   thickness was normal. Systolic function was normal. The estimated   ejection fraction was in the range of 50% to 55%. Basal inferior   akinesis. The study is not technically sufficient to allow   evaluation of LV diastolic function. - Left atrium: The atrium was normal in size. - Right atrium: The atrium was mildly dilated.  Impressions:  - Compared to a prior echo in 2014, the LVEF is stable. There may   be basal inferior akinesis and the LV is mildly dilated in this   area. The RA is mildly dilated. Significant ectopy during the   study made interpretation difficult.  Cardiac Catheterization: 01/16/16 Coronary Stent Intervention  Left Heart Cath and Coronary Angiography  Conclusion     Eccentric Mid LAD lesion, 70 %stenosed in some views.  Ramus lesion, 90 %stenosed. Very small vessel, not suitable for intervention.  Ost 1st Mrg to 1st Mrg lesion, 20 %stenosed.  Mid RCA lesion, 90 %stenosed. A STENT SYNERGY DES 3.5X28 drug eluting stent was successfully placed.  Post intervention, there is a 0% residual stenosis.  LV end diastolic pressure is normal.   Continue dual antiplatelet therapy for at least a year.  We did not address the LAD due to significant pain and bradycardia during the RCA PCI.  This resolved at the end of the procedure.  D/w Dr. [name] who will decide on further intervention.          ASSESSMENT &  PLAN:    1. CAD - s/p angioplasty in 1 994 and 1995to circumflex  coronary artery. Now S/p DES to [facility]. He also had residual 70% mLAD and a 90% small RI. No chest pain or sob. Plan is to consider PCI if recurrent CP @ discharge. Continue ASA, Plavix and low dose statin.   2. HLD - Complains of R arm pain. Will reduce Crestor to 5 mg. LDL 52.  3. R arm pain - Likely laying on that side or over activity. Try Tylenol. If no improvement f/u with PCP.   4. HTN - Stable and well controlled. Continue current regimen.  5. PVCs - quadrigeminy on EKG. Off BB 2nd to Bradycardia. Asymptomatic.   6. Carotid artery disease - status post right carotid endarterectomy performed by Dr. [name] Early June 2013 which we've been following by duplex ultrasound. This was most recently performed 06/15/15 revealing a widely patent endarterectomy site, moderate left ICA stenosis with moderate left subclavian artery stenosis. He had antegrade vertebral flow bilaterally and is neurologically asymptomatic.    Medication Adjustments/Labs and Tests Ordered: Current medicines are reviewed at length with the patient today.  Concerns regarding medicines are outlined above.  Medication changes, Labs and Tests ordered today are listed in the Patient Instructions below. Patient Instructions  Medication Change  Take Rosuvastatin (Crestor) 5 mg (1/2 tab) daily.    Follow-up  Your physician recommends that you schedule a follow-up appointment in: 2 months with Dr. [name].  If you need a refill on your cardiac medications before your next appointment, please call your pharmacy.      Jarrett Soho, [place]  01/26/2016 8:58 AM    [facility] [address], [place],   [address] Phone: [phone]; Fax: [phone]

## 2016-01-26 NOTE — Patient Outreach (Addendum)
[facility]) Care Management  01/26/2016  TERON KOSTOFF 1932/10/21 [id number]  Telephonic Care Coordination   Outreach call to patient to provide HTA Benefits information on Hearing Aids coverage.  Patient confirms Plan 1 Advised no coverage for hearing aid.  Diagnostic Hearing Exam $35 co-pay for in network and $50 co-pay for out of network  Routine Hearing Exam $0 co-pay in network and $30 co-pay out of network.   Patient also states he is not sure his BP cuff is working; states he has had the cuff for about 15 years.   RN CM advised patient will report this issue to [facility] RN CM for follow-up and discussion on first home visit.  Patient agreed with this plan.    Patient stated he needs a Release to return back to work note.  RN CM advised patient to contact Cardiologist office for not since patient just completed appt today.  Advised MD can complete and patient can pick up at MDs office.  Advised best to have Cardiologist sign off since patient has not seen his Primary MD since discharge.  Patient verbalized understanding and will contact MD office for request.   Nathaneil Canary, BSN, RN, [place] Management Care Management Coordinator [phone] Direct [phone] Cell [phone] Office [phone] Fax [email or web address]

## 2016-01-26 NOTE — Telephone Encounter (Signed)
New message   Pt verbalized that he needs a letter that states that he can return back to work

## 2016-02-01 ENCOUNTER — Ambulatory Visit: Payer: Self-pay | Admitting: *Deleted

## 2016-02-01 ENCOUNTER — Other Ambulatory Visit: Payer: Self-pay | Admitting: *Deleted

## 2016-02-01 NOTE — Patient Outreach (Signed)
Telephone assessment attempted. I spoke with Dillon Young today and explained that I am an associate of Dillon Hutchinson, RN, and see pts in their home. I explained that we work together to decide what maybe areas that we could work on together to improve his Actor. Dillon Young informed me he feels like he is doing fine and does not need a nurse to come to his home. I advised him that this is a free service and we are supported by his primary care MD. I asked him if he had our information and he said he does. I encouraged him that if in the future he would like to reconsider our services to give [place] a call.  Dillon Young [facility] [place] [phone]

## 2016-02-10 ENCOUNTER — Encounter: Payer: Self-pay | Admitting: *Deleted

## 2016-02-13 ENCOUNTER — Other Ambulatory Visit: Payer: PPO | Admitting: *Deleted

## 2016-02-13 DIAGNOSIS — I10 Essential (primary) hypertension: Secondary | ICD-10-CM

## 2016-02-13 DIAGNOSIS — I209 Angina pectoris, unspecified: Secondary | ICD-10-CM | POA: Diagnosis not present

## 2016-02-13 LAB — HEPATIC FUNCTION PANEL
ALK PHOS: 50 U/L (ref 40–115)
ALT: 13 U/L (ref 9–46)
AST: 16 U/L (ref 10–35)
Albumin: 4.3 g/dL (ref 3.6–5.1)
BILIRUBIN DIRECT: 0.2 mg/dL (ref <=0.2)
BILIRUBIN INDIRECT: 0.5 mg/dL (ref 0.2–1.2)
TOTAL PROTEIN: 7.2 g/dL (ref 6.1–8.1)
Total Bilirubin: 0.7 mg/dL (ref 0.2–1.2)

## 2016-02-13 LAB — LIPID PANEL
CHOLESTEROL: 122 mg/dL — AB (ref 125–200)
HDL: 46 mg/dL (ref >=40)
LDL CALC: 47 mg/dL (ref <130)
TRIGLYCERIDES: 144 mg/dL (ref <150)
Total CHOL/HDL Ratio: 2.7 Ratio (ref <=5.0)
VLDL: 29 mg/dL (ref <30)

## 2016-02-14 ENCOUNTER — Encounter: Payer: Self-pay | Admitting: Family

## 2016-02-14 ENCOUNTER — Ambulatory Visit (INDEPENDENT_AMBULATORY_CARE_PROVIDER_SITE_OTHER): Payer: PPO | Admitting: Family

## 2016-02-14 VITALS — BP 174/72 | HR 74 | Temp 97.9°F | Resp 18 | Ht 69.0 in | Wt 190.8 lb

## 2016-02-14 DIAGNOSIS — M5441 Lumbago with sciatica, right side: Secondary | ICD-10-CM

## 2016-02-14 DIAGNOSIS — G8929 Other chronic pain: Secondary | ICD-10-CM

## 2016-02-14 MED ORDER — PREDNISONE 10 MG (21) PO TBPK: ORAL_TABLET | ORAL | 0 refills | Status: DC

## 2016-02-14 NOTE — Patient Instructions (Signed)
Thank you for choosing [facility].  SUMMARY AND INSTRUCTIONS:  Ice/moist heat 20 minutes every 2 hours and following activity.  Exercises 1-2 times daily.  Start the prednisone and continue Tylenol as needed.  Follow up:  If your symptoms worsen or fail to improve, please contact our office for further instruction, or in case of emergency go directly to the emergency room at the closest medical facility.     Low Back Strain With Rehab A strain is an injury in which a tendon or muscle is torn. The muscles and tendons of the lower back are vulnerable to strains. However, these muscles and tendons are very strong and require a great force to be injured. Strains are classified into three categories. Grade 1 strains cause pain, but the tendon is not lengthened. Grade 2 strains include a lengthened ligament, due to the ligament being stretched or partially ruptured. With grade 2 strains there is still function, although the function may be decreased. Grade 3 strains involve a complete tear of the tendon or muscle, and function is usually impaired. SYMPTOMS   Pain in the lower back.  Pain that affects one side more than the other.  Pain that gets worse with movement and may be felt in the hip, buttocks, or back of the thigh.  Muscle spasms of the muscles in the back.  Swelling along the muscles of the back.  Loss of strength of the back muscles.  Crackling sound (crepitation) when the muscles are touched. CAUSES  Lower back strains occur when a force is placed on the muscles or tendons that is greater than they can handle. Common causes of injury include:  Prolonged overuse of the muscle-tendon units in the lower back, usually from incorrect posture.  A single violent injury or force applied to the back. RISK INCREASES WITH:  Sports that involve twisting forces on the spine or a lot of bending at the waist (football, rugby, weightlifting, bowling, golf, tennis, speed  skating, racquetball, swimming, running, gymnastics, diving).  Poor strength and flexibility.  Failure to warm up properly before activity.  Family history of lower back pain or disk disorders.  Previous back injury or surgery (especially fusion).  Poor posture with lifting, especially heavy objects.  Prolonged sitting, especially with poor posture. PREVENTION   Learn and use proper posture when sitting or lifting (maintain proper posture when sitting, lift using the knees and legs, not at the waist).  Warm up and stretch properly before activity.  Allow for adequate recovery between workouts.  Maintain physical fitness:  Strength, flexibility, and endurance.  Cardiovascular fitness. PROGNOSIS  If treated properly, lower back strains usually heal within 6 weeks. RELATED COMPLICATIONS   Recurring symptoms, resulting in a chronic problem.  Chronic inflammation, scarring, and partial muscle-tendon tear.  Delayed healing or resolution of symptoms.  Prolonged disability. TREATMENT  Treatment first involves the use of ice and medicine, to reduce pain and inflammation. The use of strengthening and stretching exercises may help reduce pain with activity. These exercises may be performed at home or with a therapist. Severe injuries may require referral to a therapist for further evaluation and treatment, such as ultrasound. Your caregiver may advise that you wear a back brace or corset, to help reduce pain and discomfort. Often, prolonged bed rest results in greater harm then benefit. Corticosteroid injections may be recommended. However, these should be reserved for the most serious cases. It is important to avoid using your back when lifting objects. At night, sleep on  your back on a firm mattress with a pillow placed under your knees. If non-surgical treatment is unsuccessful, surgery may be needed.  MEDICATION   If pain medicine is needed, nonsteroidal anti-inflammatory medicines  (aspirin and ibuprofen), or other minor pain relievers (acetaminophen), are often advised.  Do not take pain medicine for 7 days before surgery.  Prescription pain relievers may be given, if your caregiver thinks they are needed. Use only as directed and only as much as you need.  Ointments applied to the skin may be helpful.  Corticosteroid injections may be given by your caregiver. These injections should be reserved for the most serious cases, because they may only be given a certain number of times. HEAT AND COLD  Cold treatment (icing) should be applied for 10 to 15 minutes every 2 to 3 hours for inflammation and pain, and immediately after activity that aggravates your symptoms. Use ice packs or an ice massage.  Heat treatment may be used before performing stretching and strengthening activities prescribed by your caregiver, physical therapist, or athletic trainer. Use a heat pack or a warm water soak. SEEK MEDICAL CARE IF:   Symptoms get worse or do not improve in 2 to 4 weeks, despite treatment.  You develop numbness, weakness, or loss of bowel or bladder function.  New, unexplained symptoms develop. (Drugs used in treatment may produce side effects.) EXERCISES  RANGE OF MOTION (ROM) AND STRETCHING EXERCISES - Low Back Strain Most people with lower back pain will find that their symptoms get worse with excessive bending forward (flexion) or arching at the lower back (extension). The exercises which will help resolve your symptoms will focus on the opposite motion.  Your physician, physical therapist or athletic trainer will help you determine which exercises will be most helpful to resolve your lower back pain. Do not complete any exercises without first consulting with your caregiver. Discontinue any exercises which make your symptoms worse until you speak to your caregiver.  If you have pain, numbness or tingling which travels down into your buttocks, leg or foot, the goal of the  therapy is for these symptoms to move closer to your back and eventually resolve. Sometimes, these leg symptoms will get better, but your lower back pain may worsen. This is typically an indication of progress in your rehabilitation. Be very alert to any changes in your symptoms and the activities in which you participated in the 24 hours prior to the change. Sharing this information with your caregiver will allow him/her to most efficiently treat your condition.  These exercises may help you when beginning to rehabilitate your injury. Your symptoms may resolve with or without further involvement from your physician, physical therapist or athletic trainer. While completing these exercises, remember:  Restoring tissue flexibility helps normal motion to return to the joints. This allows healthier, less painful movement and activity.  An effective stretch should be held for at least 30 seconds.  A stretch should never be painful. You should only feel a gentle lengthening or release in the stretched tissue. FLEXION RANGE OF MOTION AND STRETCHING EXERCISES: STRETCH - Flexion, Single Knee to Chest   Lie on a firm bed or floor with both legs extended in front of you.  Keeping one leg in contact with the floor, bring your opposite knee to your chest. Hold your leg in place by either grabbing behind your thigh or at your knee.  Pull until you feel a gentle stretch in your lower back. Hold __________ seconds.  Slowly release your grasp and repeat the exercise with the opposite side. Repeat __________ times. Complete this exercise __________ times per day.  STRETCH - Flexion, Double Knee to Chest   Lie on a firm bed or floor with both legs extended in front of you.  Keeping one leg in contact with the floor, bring your opposite knee to your chest.  Tense your stomach muscles to support your back and then lift your other knee to your chest. Hold your legs in place by either grabbing behind your thighs  or at your knees.  Pull both knees toward your chest until you feel a gentle stretch in your lower back. Hold __________ seconds.  Tense your stomach muscles and slowly return one leg at a time to the floor. Repeat __________ times. Complete this exercise __________ times per day.  STRETCH - Low Trunk Rotation  Lie on a firm bed or floor. Keeping your legs in front of you, bend your knees so they are both pointed toward the ceiling and your feet are flat on the floor.  Extend your arms out to the side. This will stabilize your upper body by keeping your shoulders in contact with the floor.  Gently and slowly drop both knees together to one side until you feel a gentle stretch in your lower back. Hold for __________ seconds.  Tense your stomach muscles to support your lower back as you bring your knees back to the starting position. Repeat the exercise to the other side. Repeat __________ times. Complete this exercise __________ times per day  EXTENSION RANGE OF MOTION AND FLEXIBILITY EXERCISES: STRETCH - Extension, Prone on Elbows   Lie on your stomach on the floor, a bed will be too soft. Place your palms about shoulder width apart and at the height of your head.  Place your elbows under your shoulders. If this is too painful, stack pillows under your chest.  Allow your body to relax so that your hips drop lower and make contact more completely with the floor.  Hold this position for __________ seconds.  Slowly return to lying flat on the floor. Repeat __________ times. Complete this exercise __________ times per day.  RANGE OF MOTION - Extension, Prone Press Ups  Lie on your stomach on the floor, a bed will be too soft. Place your palms about shoulder width apart and at the height of your head.  Keeping your back as relaxed as possible, slowly straighten your elbows while keeping your hips on the floor. You may adjust the placement of your hands to maximize your comfort. As you  gain motion, your hands will come more underneath your shoulders.  Hold this position __________ seconds.  Slowly return to lying flat on the floor. Repeat __________ times. Complete this exercise __________ times per day.  RANGE OF MOTION- Quadruped, Neutral Spine   Assume a hands and knees position on a firm surface. Keep your hands under your shoulders and your knees under your hips. You may place padding under your knees for comfort.  Drop your head and point your tail bone toward the ground below you. This will round out your lower back like an angry cat. Hold this position for __________ seconds.  Slowly lift your head and release your tail bone so that your back sags into a large arch, like an old horse.  Hold this position for __________ seconds.  Repeat this until you feel limber in your lower back.  Now, find your "sweet spot." This will  be the most comfortable position somewhere between the two previous positions. This is your neutral spine. Once you have found this position, tense your stomach muscles to support your lower back.  Hold this position for __________ seconds. Repeat __________ times. Complete this exercise __________ times per day.  STRENGTHENING EXERCISES - Low Back Strain These exercises may help you when beginning to rehabilitate your injury. These exercises should be done near your "sweet spot." This is the neutral, low-back arch, somewhere between fully rounded and fully arched, that is your least painful position. When performed in this safe range of motion, these exercises can be used for people who have either a flexion or extension based injury. These exercises may resolve your symptoms with or without further involvement from your physician, physical therapist or athletic trainer. While completing these exercises, remember:   Muscles can gain both the endurance and the strength needed for everyday activities through controlled exercises.  Complete these  exercises as instructed by your physician, physical therapist or athletic trainer. Increase the resistance and repetitions only as guided.  You may experience muscle soreness or fatigue, but the pain or discomfort you are trying to eliminate should never worsen during these exercises. If this pain does worsen, stop and make certain you are following the directions exactly. If the pain is still present after adjustments, discontinue the exercise until you can discuss the trouble with your caregiver. STRENGTHENING - Deep Abdominals, Pelvic Tilt  Lie on a firm bed or floor. Keeping your legs in front of you, bend your knees so they are both pointed toward the ceiling and your feet are flat on the floor.  Tense your lower abdominal muscles to press your lower back into the floor. This motion will rotate your pelvis so that your tail bone is scooping upwards rather than pointing at your feet or into the floor.  With a gentle tension and even breathing, hold this position for __________ seconds. Repeat __________ times. Complete this exercise __________ times per day.  STRENGTHENING - Abdominals, Crunches   Lie on a firm bed or floor. Keeping your legs in front of you, bend your knees so they are both pointed toward the ceiling and your feet are flat on the floor. Cross your arms over your chest.  Slightly tip your chin down without bending your neck.  Tense your abdominals and slowly lift your trunk high enough to just clear your shoulder blades. Lifting higher can put excessive stress on the lower back and does not further strengthen your abdominal muscles.  Control your return to the starting position. Repeat __________ times. Complete this exercise __________ times per day.  STRENGTHENING - Quadruped, Opposite UE/LE Lift   Assume a hands and knees position on a firm surface. Keep your hands under your shoulders and your knees under your hips. You may place padding under your knees for  comfort.  Find your neutral spine and gently tense your abdominal muscles so that you can maintain this position. Your shoulders and hips should form a rectangle that is parallel with the floor and is not twisted.  Keeping your trunk steady, lift your right hand no higher than your shoulder and then your left leg no higher than your hip. Make sure you are not holding your breath. Hold this position __________ seconds.  Continuing to keep your abdominal muscles tense and your back steady, slowly return to your starting position. Repeat with the opposite arm and leg. Repeat __________ times. Complete this exercise __________ times  per day.  STRENGTHENING - Lower Abdominals, Double Knee Lift  Lie on a firm bed or floor. Keeping your legs in front of you, bend your knees so they are both pointed toward the ceiling and your feet are flat on the floor.  Tense your abdominal muscles to brace your lower back and slowly lift both of your knees until they come over your hips. Be certain not to hold your breath.  Hold __________ seconds. Using your abdominal muscles, return to the starting position in a slow and controlled manner. Repeat __________ times. Complete this exercise __________ times per day.  POSTURE AND BODY MECHANICS CONSIDERATIONS - Low Back Strain Keeping correct posture when sitting, standing or completing your activities will reduce the stress put on different body tissues, allowing injured tissues a chance to heal and limiting painful experiences. The following are general guidelines for improved posture. Your physician or physical therapist will provide you with any instructions specific to your needs. While reading these guidelines, remember:  The exercises prescribed by your provider will help you have the flexibility and strength to maintain correct postures.  The correct posture provides the best environment for your joints to work. All of your joints have less wear and tear when  properly supported by a spine with good posture. This means you will experience a healthier, less painful body.  Correct posture must be practiced with all of your activities, especially prolonged sitting and standing. Correct posture is as important when doing repetitive low-stress activities (typing) as it is when doing a single heavy-load activity (lifting). RESTING POSITIONS Consider which positions are most painful for you when choosing a resting position. If you have pain with flexion-based activities (sitting, bending, stooping, squatting), choose a position that allows you to rest in a less flexed posture. You would want to avoid curling into a fetal position on your side. If your pain worsens with extension-based activities (prolonged standing, working overhead), avoid resting in an extended position such as sleeping on your stomach. Most people will find more comfort when they rest with their spine in a more neutral position, neither too rounded nor too arched. Lying on a non-sagging bed on your side with a pillow between your knees, or on your back with a pillow under your knees will often provide some relief. Keep in mind, being in any one position for a prolonged period of time, no matter how correct your posture, can still lead to stiffness. PROPER SITTING POSTURE In order to minimize stress and discomfort on your spine, you must sit with correct posture. Sitting with good posture should be effortless for a healthy body. Returning to good posture is a gradual process. Many people can work toward this most comfortably by using various supports until they have the flexibility and strength to maintain this posture on their own. When sitting with proper posture, your ears will fall over your shoulders and your shoulders will fall over your hips. You should use the back of the chair to support your upper back. Your lower back will be in a neutral position, just slightly arched. You may place a small  pillow or folded towel at the base of your lower back for support.  When working at a desk, create an environment that supports good, upright posture. Without extra support, muscles tire, which leads to excessive strain on joints and other tissues. Keep these recommendations in mind: CHAIR:  A chair should be able to slide under your desk when your back makes contact  with the back of the chair. This allows you to work closely.  The chair's height should allow your eyes to be level with the upper part of your monitor and your hands to be slightly lower than your elbows. BODY POSITION  Your feet should make contact with the floor. If this is not possible, use a foot rest.  Keep your ears over your shoulders. This will reduce stress on your neck and lower back. INCORRECT SITTING POSTURES  If you are feeling tired and unable to assume a healthy sitting posture, do not slouch or slump. This puts excessive strain on your back tissues, causing more damage and pain. Healthier options include:  Using more support, like a lumbar pillow.  Switching tasks to something that requires you to be upright or walking.  Talking a brief walk.  Lying down to rest in a neutral-spine position. PROLONGED STANDING WHILE SLIGHTLY LEANING FORWARD  When completing a task that requires you to lean forward while standing in one place for a long time, place either foot up on a stationary 2-4 inch high object to help maintain the best posture. When both feet are on the ground, the lower back tends to lose its slight inward curve. If this curve flattens (or becomes too large), then the back and your other joints will experience too much stress, tire more quickly, and can cause pain. CORRECT STANDING POSTURES Proper standing posture should be assumed with all daily activities, even if they only take a few moments, like when brushing your teeth. As in sitting, your ears should fall over your shoulders and your shoulders should  fall over your hips. You should keep a slight tension in your abdominal muscles to brace your spine. Your tailbone should point down to the ground, not behind your body, resulting in an over-extended swayback posture.  INCORRECT STANDING POSTURES  Common incorrect standing postures include a forward head, locked knees and/or an excessive swayback. WALKING Walk with an upright posture. Your ears, shoulders and hips should all line-up. PROLONGED ACTIVITY IN A FLEXED POSITION When completing a task that requires you to bend forward at your waist or lean over a low surface, try to find a way to stabilize 3 out of 4 of your limbs. You can place a hand or elbow on your thigh or rest a knee on the surface you are reaching across. This will provide you more stability so that your muscles do not fatigue as quickly. By keeping your knees relaxed, or slightly bent, you will also reduce stress across your lower back. CORRECT LIFTING TECHNIQUES DO :   Assume a wide stance. This will provide you more stability and the opportunity to get as close as possible to the object which you are lifting.  Tense your abdominals to brace your spine. Bend at the knees and hips. Keeping your back locked in a neutral-spine position, lift using your leg muscles. Lift with your legs, keeping your back straight.  Test the weight of unknown objects before attempting to lift them.  Try to keep your elbows locked down at your sides in order get the best strength from your shoulders when carrying an object.  Always ask for help when lifting heavy or awkward objects. INCORRECT LIFTING TECHNIQUES DO NOT:   Lock your knees when lifting, even if it is a small object.  Bend and twist. Pivot at your feet or move your feet when needing to change directions.  Assume that you can safely pick up even a  paper clip without proper posture.   This information is not intended to replace advice given to you by your health care provider.  Make sure you discuss any questions you have with your health care provider.   Document Released: 05/14/2005 Document Revised: 06/04/2014 Document Reviewed: 08/26/2008 [employer] Interactive Patient Education [employer].

## 2016-02-14 NOTE — Progress Notes (Signed)
Subjective:    Patient ID: Dillon Young, male    DOB: 01/08/1933, 82 y.o.   MRN: [id number]  Chief Complaint  Patient presents with  . Leg Pain    right side pain in leg, arm, hip and lower back, x3     HPI:  Dillon Young is a 82 y.o. male who  has a past medical history of Adenomatous colon polyp; Allergic rhinitis; BPH (benign prostatic hyperplasia); CAD (coronary artery disease); Carotid artery disease ([place]); CHF (congestive heart failure) ([place]); Chronic kidney disease; Clotting disorder ([place]); CVA (cerebral infarction); Diabetes mellitus; Diverticulitis; GERD (gastroesophageal reflux disease); Hemorrhoids; HTN (hypertension); Hyperlipidemia; IBS (irritable bowel syndrome); Low back pain; Prostate cancer ([place]) (2008); Sleep apnea; and Smoker. and presents today for an office visit.  This is a new problem. Associated symptoms of pain located in his right arm, leg and lower back. Recently had a cardiac catheterization through his right arm and noted to have pain located in his right arm for about 4-5 days following the procedure which has since resolved. Notes it appears to have traveled down to his right low back and lower extremity.Described as achiness and that his muscles are really tight. Modifying factors include Tylenol and a heat pack which have helped with his symptoms. Describes when he walks there is a stiffness feeling. No trauma or injury that he can recall.   Allergies  Allergen Reactions  . Lipitor [Atorvastatin] Other (See Comments)    myalgias      Outpatient Medications Prior to Visit  Medication Sig Dispense Refill  . acetaminophen (TYLENOL) 325 MG tablet Take 2 tablets (650 mg total) by mouth every 4 (four) hours as needed for headache or mild pain.    Marland Kitchen amLODipine (NORVASC) 10 MG tablet Take 1 tablet (10 mg total) by mouth daily. 90 tablet 3  . aspirin EC 81 MG tablet Take 81 mg by mouth every evening.     . cholecalciferol (VITAMIN D) 1000 UNITS tablet  Take 1,000 Units by mouth daily.      . clopidogrel (PLAVIX) 75 MG tablet Take 1 tablet (75 mg total) by mouth daily with breakfast. 90 tablet 3  . dicyclomine (BENTYL) 20 MG tablet Take 1 tablet (20 mg total) by mouth every 6 (six) hours as needed for spasms. 30 tablet 5  . Doxepin HCl (SILENOR) 3 MG TABS Take 1 tablet (3 mg total) by mouth at bedtime as needed (sleep).    . fluticasone (FLONASE) 50 MCG/ACT nasal spray Place 1-2 sprays into both nostrils daily as needed for allergies. 48 g 1  . hydrocortisone (ANUSOL-HC) 2.5 % rectal cream Place 1 application rectally 2 (two) times daily as needed for hemorrhoids.     . irbesartan-hydrochlorothiazide (AVALIDE) 300-12.5 MG tablet Take 1 tablet by mouth daily. 90 tablet 3  . Melatonin 5 MG CAPS Take 1 capsule (5 mg total) by mouth daily as needed (sleep).    . nitroGLYCERIN (NITROSTAT) 0.4 MG SL tablet Place 1 tablet (0.4 mg total) under the tongue every 5 (five) minutes as needed for chest pain. 25 tablet 3  . polyethylene glycol powder (GLYCOLAX/MIRALAX) powder Take 17 g by mouth as needed. (Patient taking differently: Take 17 g by mouth daily as needed for mild constipation. ) 255 g 3  . rosuvastatin (CRESTOR) 10 MG tablet Take 5 mg by mouth daily.    . vitamin C (ASCORBIC ACID) 500 MG tablet Take 500 mg by mouth daily.     . Wheat  Dextrin (BENEFIBER) POWD 2 tablespoons a day (Patient taking differently: Take 10 mLs by mouth daily. )     No facility-administered medications prior to visit.       Past Surgical History:  Procedure Laterality Date  . APPENDECTOMY    . CARDIAC CATHETERIZATION N/A 01/16/2016   Procedure: Left Heart Cath and Coronary Angiography;  Surgeon: Jettie Booze, MD;  Location: [facility];  Service: Cardiovascular;  Laterality: N/A;  . CARDIAC CATHETERIZATION N/A 01/16/2016   Procedure: Coronary Stent Intervention;  Surgeon: Jettie Booze, MD;  Location: [facility];  Service: Cardiovascular;   Laterality: N/A;  . Cataract with Lens     Bilateral  . COLONOSCOPY  10/21/2008   diverticulosis, radiation proctitis, internal hemorrhoids  . ENDARTERECTOMY  11/12/2011   Procedure: ENDARTERECTOMY CAROTID;  Surgeon: Rosetta Posner, MD;  Location: [facility];  Service: Vascular;  Laterality: Right;  . Esophageal dilitation    . HEMORRHOID BANDING    . Lithrotripsy      x 3  . LUMBAR LAMINECTOMY    . TRANSURETHRAL RESECTION OF PROSTATE  10/2003      Past Medical History:  Diagnosis Date  . Adenomatous colon polyp   . Allergic rhinitis   . BPH (benign prostatic hyperplasia)   . CAD (coronary artery disease)    sees Dr. [name]   . Carotid artery disease ([place])   . CHF (congestive heart failure) ([place])   . Chronic kidney disease    [place]. Prostate cancer  . Clotting disorder ([place])   . CVA (cerebral infarction)   . Diabetes mellitus    "Borderline"  . Diverticulitis   . GERD (gastroesophageal reflux disease)    history  . Hemorrhoids   . HTN (hypertension)   . Hyperlipidemia    echo 10-25-2010 mild abnormalities  . IBS (irritable bowel syndrome)   . Low back pain   . Prostate cancer ([place]) 2008   seed implantation  . Sleep apnea    last study was aborted.  Has never worn a CPAP.    Marland Kitchen Smoker       Review of Systems  Constitutional: Negative for chills and fever.  Respiratory: Negative for chest tightness and shortness of breath.   Musculoskeletal: Positive for back pain.       Positive for right thigh/hip pain  Neurological: Negative for weakness and numbness.      Objective:    BP (!) 174/72 (BP Location: Left Arm, Patient Position: Sitting, Cuff Size: Normal)   Pulse 74   Temp 97.9 F (36.6 C) (Oral)   Resp 18   Ht 5\' 9"  (1.753 m)   Wt 190 lb 12.8 oz (86.5 kg)   SpO2 95%   BMI 28.18 kg/m  Nursing note and vital signs reviewed.  Physical Exam  Constitutional: He is oriented to person, place, and time. He appears well-developed and well-nourished. No  distress.  Cardiovascular: Normal rate, regular rhythm, normal heart sounds and intact distal pulses.   Pulmonary/Chest: Effort normal and breath sounds normal.  Musculoskeletal:  Left Low back/hip - no obvious deformity, discoloration, or edema. Mild tenderness elicited over low back paraspinal musculature and anterior thigh. Range of motion is limited in flexion of the spine. Hip range of motion within normal limits. Distal pulses and sensation are intact and appropriate. Strength is 5+ with minimal discomfort. Negative straight leg raise and negative Faber's test.  Neurological: He is alert and oriented to person, place, and time.  Skin: Skin is warm and dry.  Psychiatric: He has a normal mood and affect. His behavior is normal. Judgment and thought content normal.       Assessment & Plan:   Problem List Items Addressed This Visit      Other   LOW BACK PAIN SYNDROME - Primary    Symptoms and exam consistent with possible low back radiculopathy causing his current symptoms although cannot rule out local muscle skeletal strain although no significant trauma to support this. Treat conservatively with ice/moist heat, home exercise therapy, and prednisone taper. Does have follow-up with spine specialist within the next week. Follow-up if symptoms worsen or do not improve.      Relevant Medications   predniSONE (STERAPRED UNI-PAK 21 TAB) 10 MG (21) TBPK tablet    Other Visit Diagnoses   None.      I am having Mr. Mckoy start on predniSONE. I am also having him maintain his vitamin C, cholecalciferol, aspirin EC, dicyclomine, irbesartan-hydrochlorothiazide, BENEFIBER, polyethylene glycol powder, hydrocortisone, nitroGLYCERIN, fluticasone, acetaminophen, amLODipine, clopidogrel, Doxepin HCl, Melatonin, and rosuvastatin.   Meds ordered this encounter  Medications  . predniSONE (STERAPRED UNI-PAK 21 TAB) 10 MG (21) TBPK tablet    Sig: Take 6 tablets x 1 day, 5 tablets x 1 day, 4 tablets  x 1 day, 3 tablets x 1 day, 2 tablets x 1 day, 1 tablet x 1 day    Dispense:  21 tablet    Refill:  0    Order Specific Question:   Supervising Provider    Answer:   Pricilla Holm A [id number]     Follow-up: No Follow-up on file.  Mauricio Po, FNP

## 2016-02-14 NOTE — Assessment & Plan Note (Signed)
Symptoms and exam consistent with possible low back radiculopathy causing his current symptoms although cannot rule out local muscle skeletal strain although no significant trauma to support this. Treat conservatively with ice/moist heat, home exercise therapy, and prednisone taper. Does have follow-up with spine specialist within the next week. Follow-up if symptoms worsen or do not improve.

## 2016-02-22 ENCOUNTER — Ambulatory Visit: Payer: Self-pay | Admitting: Internal Medicine

## 2016-02-22 ENCOUNTER — Telehealth: Payer: Self-pay | Admitting: Internal Medicine

## 2016-02-22 NOTE — Telephone Encounter (Signed)
SE NOTE: All timestamps contained within this report are represented as [place] Standard Time. CONFIDENTIALTY NOTICE: This fax transmission is intended only for the addressee. It contains information that is legally privileged, confidential or otherwise protected from use or disclosure. If you are not the intended recipient, you are strictly prohibited from reviewing, disclosing, copying using or disseminating any of this information or taking any action in reliance on or regarding this information. If you have received this fax in error, please notify us immediately by telephone so that we can arrange for its return to [place]. Phone: [phone], Toll-Free: [phone], Fax: [phone] Page: 1 of 1 Call Id: [id number] [facility] - Client [name] Patient Name: Dillon Young DOB: [birth year] Initial Comment pain in back, wants to get rx called in. saw dr. Elna Breslow last week for pain in legs and back. legs are better. Nurse Assessment Nurse: Dimas Chyle, RN, Dellis Filbert Date/Time Eilene Ghazi Time): 02/22/2016 9:09:33 AM Confirm and document reason for call. If symptomatic, describe symptoms. You must click the next button to save text entered. ---Pain in back, wants to get rx called in. saw NP Calone last week for pain in legs and back. Legs are better. Has the patient traveled out of the country within the last 30 days? ---No Does the patient have any new or worsening symptoms? ---Yes Will a triage be completed? ---Yes Related visit to physician within the last 2 weeks? ---Yes Does the PT have any chronic conditions? (i.e. diabetes, asthma, etc.) ---Yes List chronic conditions. ---Chronic back pain, CAD, and cardiac stent x 2, HTN Is this a behavioral health or substance abuse call? ---No Guidelines Guideline Title Affirmed Question Affirmed Notes Back Pain [1] MODERATE back pain (e.g., interferes with normal activities) AND [2]  present > 3 days Final Disposition User See PCP When Office is Open (within 3 days) Dimas Chyle, RN, Dellis Filbert Disagree/Comply: Comply

## 2016-02-23 ENCOUNTER — Other Ambulatory Visit (INDEPENDENT_AMBULATORY_CARE_PROVIDER_SITE_OTHER): Payer: Self-pay | Admitting: Orthopaedic Surgery

## 2016-02-23 DIAGNOSIS — M4806 Spinal stenosis, lumbar region: Secondary | ICD-10-CM | POA: Diagnosis not present

## 2016-02-23 DIAGNOSIS — M545 Low back pain: Secondary | ICD-10-CM

## 2016-02-27 ENCOUNTER — Other Ambulatory Visit: Payer: Self-pay | Admitting: Internal Medicine

## 2016-02-27 DIAGNOSIS — K5730 Diverticulosis of large intestine without perforation or abscess without bleeding: Secondary | ICD-10-CM

## 2016-02-29 ENCOUNTER — Ambulatory Visit
Admission: RE | Admit: 2016-02-29 | Discharge: 2016-02-29 | Disposition: A | Discharge status: Home / Self Care | Payer: PPO | Source: Ambulatory Visit | Attending: Orthopaedic Surgery | Admitting: Orthopaedic Surgery

## 2016-02-29 DIAGNOSIS — M545 Low back pain, unspecified: Secondary | ICD-10-CM

## 2016-03-06 ENCOUNTER — Ambulatory Visit (INDEPENDENT_AMBULATORY_CARE_PROVIDER_SITE_OTHER): Payer: PPO | Admitting: Orthopaedic Surgery

## 2016-03-06 DIAGNOSIS — M4216 Adult osteochondrosis of spine, lumbar region: Secondary | ICD-10-CM

## 2016-03-06 DIAGNOSIS — M25551 Pain in right hip: Secondary | ICD-10-CM

## 2016-03-06 DIAGNOSIS — M545 Low back pain: Secondary | ICD-10-CM | POA: Diagnosis not present

## 2016-03-07 ENCOUNTER — Telehealth: Payer: Self-pay | Admitting: Cardiovascular Disease

## 2016-03-07 NOTE — Telephone Encounter (Signed)
Pt has medication questions and would like a nurse call

## 2016-03-07 NOTE — Telephone Encounter (Signed)
Spoke to patient. Patient states he is having a lot of back and legs pain.patient is unable to walk well or put his shoes on to wear.   He went  See Dr Durward Fortes.  Patient would like to have a  Injection.  patient recently had PCI and now on plavix.  patient would like to to know if he can hold plavix for injection.  Patent is aware will defer to Dr [name].

## 2016-03-07 NOTE — Telephone Encounter (Signed)
SPOKE TO PATIENT. PATIENT AWARE. STATES HE JOINED THE [employer].

## 2016-03-07 NOTE — Telephone Encounter (Signed)
He just had a DES stent placed in August of this year. He cannot interrupt his antiplatelet therapy until August 2018.

## 2016-03-08 ENCOUNTER — Ambulatory Visit (INDEPENDENT_AMBULATORY_CARE_PROVIDER_SITE_OTHER): Payer: PPO

## 2016-03-08 DIAGNOSIS — Z23 Encounter for immunization: Secondary | ICD-10-CM | POA: Diagnosis not present

## 2016-03-12 ENCOUNTER — Telehealth: Payer: Self-pay | Admitting: Emergency Medicine

## 2016-03-12 NOTE — Telephone Encounter (Signed)
What kind of pain is he talking about?  Is he talking about arthritis pain or is he concerned that there is poor blood flow to his extremities?

## 2016-03-12 NOTE — Telephone Encounter (Signed)
Pt called and stated he is on clopidogrel (PLAVIX) 75 MG tablet. When he is in pain he usually takes tylenol but it doesn't seem to be taking a lot of the pain away. Is there something else he can take for the pain other than tylenol even if he is on Plavix. Please advise thanks.

## 2016-03-12 NOTE — Telephone Encounter (Signed)
Please advise if there is an alternative to tylenol that can be taken with clopidogrel.

## 2016-03-13 ENCOUNTER — Other Ambulatory Visit: Payer: Self-pay | Admitting: Internal Medicine

## 2016-03-13 DIAGNOSIS — M170 Bilateral primary osteoarthritis of knee: Secondary | ICD-10-CM

## 2016-03-13 MED ORDER — DICLOFENAC SODIUM 1 % TD GEL: 2.0000 g | Freq: Four times a day (QID) | TRANSDERMAL | 11 refills | Status: DC

## 2016-03-13 NOTE — Telephone Encounter (Signed)
RX sent

## 2016-03-13 NOTE — Telephone Encounter (Signed)
Pt informed

## 2016-03-13 NOTE — Telephone Encounter (Signed)
Pt is having arthritis pain. Tylenol is not helping. Pt wanted to know if there was a cream or another antiinflammatory medication that could be used in addition to the Tylenol.

## 2016-03-23 ENCOUNTER — Telehealth: Payer: Self-pay | Admitting: Cardiovascular Disease

## 2016-03-23 MED ORDER — IRBESARTAN-HYDROCHLOROTHIAZIDE 300-12.5 MG PO TABS
1.0000 | ORAL_TABLET | Freq: Every day | ORAL | 1 refills | Status: DC
Start: 2016-03-23 — End: 2016-07-27

## 2016-03-23 NOTE — Telephone Encounter (Signed)
rx refill sent

## 2016-03-23 NOTE — Telephone Encounter (Signed)
°*  STAT* If patient is at the pharmacy, call can be transferred to refill team.   1. Which medications need to be refilled? (please list name of each medication and dose if known) Irbesartan-HTCT 300-12.5mg   2. Which pharmacy/location (including street and city if local pharmacy) is medication to be sent to? [facility] [address]  [phone]   3. Do they need a 30 day or 90 day supply? [place]

## 2016-03-31 ENCOUNTER — Encounter: Payer: Self-pay | Admitting: Family Medicine

## 2016-03-31 ENCOUNTER — Ambulatory Visit (INDEPENDENT_AMBULATORY_CARE_PROVIDER_SITE_OTHER): Payer: PPO | Admitting: Family Medicine

## 2016-03-31 DIAGNOSIS — R05 Cough: Secondary | ICD-10-CM | POA: Diagnosis not present

## 2016-03-31 DIAGNOSIS — R059 Cough, unspecified: Secondary | ICD-10-CM

## 2016-03-31 MED ORDER — BENZONATATE 200 MG PO CAPS: 200.0000 mg | ORAL_CAPSULE | Freq: Three times a day (TID) | ORAL | 0 refills | Status: DC | PRN

## 2016-03-31 NOTE — Patient Instructions (Signed)
Rest and fluids.  Okay to take mucinex with plenty of fluids.  Use tessalon for cough.  Update [place] as needed.  Take care.  Glad to see you.

## 2016-03-31 NOTE — Progress Notes (Signed)
Sx started about 4-5 days ago.  ST. HA. Subjective fever for about 1 day.  Since then more cough, worse in the meantime.  Cough is worse at night.  Taking delsym and vicks, mucinex and coricidin.  He thought he had relatively mild flu like sx overall. Cough is usually dry.    Had a flu shot about 1 month ago.  He has h/o bothersome arthritis.    Meds, vitals, and allergies reviewed.   ROS: Per HPI unless specifically indicated in ROS section   GEN: nad, alert and oriented HEENT: mucous membranes moist, tm w/o erythema, nasal exam w/o erythema, clear discharge noted,  OP with minimal cobblestoning, sinuses not ttp NECK: supple w/o LA CV: rrr.   PULM: ctab, no inc wob EXT: no edema

## 2016-03-31 NOTE — Assessment & Plan Note (Signed)
Ctab.  Nontoxic.  Most bothered by cough, likely with post nasal gtt component.  Already on nasal steroid.  Likely doesn't need abx at this point.  Add on tessalon.  F/u prn.  See AVS.

## 2016-03-31 NOTE — Progress Notes (Signed)
Pre visit review using our clinic review tool, if applicable. No additional management support is needed unless otherwise documented below in the visit note. 

## 2016-04-03 ENCOUNTER — Encounter: Payer: Self-pay | Admitting: Cardiovascular Disease

## 2016-04-03 ENCOUNTER — Ambulatory Visit (INDEPENDENT_AMBULATORY_CARE_PROVIDER_SITE_OTHER): Payer: PPO | Admitting: Cardiovascular Disease

## 2016-04-03 DIAGNOSIS — I2510 Atherosclerotic heart disease of native coronary artery without angina pectoris: Secondary | ICD-10-CM

## 2016-04-03 DIAGNOSIS — I10 Essential (primary) hypertension: Secondary | ICD-10-CM | POA: Diagnosis not present

## 2016-04-03 DIAGNOSIS — E785 Hyperlipidemia, unspecified: Secondary | ICD-10-CM | POA: Diagnosis not present

## 2016-04-03 DIAGNOSIS — Z9861 Coronary angioplasty status: Secondary | ICD-10-CM

## 2016-04-03 DIAGNOSIS — I679 Cerebrovascular disease, unspecified: Secondary | ICD-10-CM | POA: Diagnosis not present

## 2016-04-03 NOTE — Assessment & Plan Note (Signed)
History of peripheral arterial disease status post right carotid endarterectomy performed by Dr. [name] Early June 2013 which we followed by duplex ultrasound.

## 2016-04-03 NOTE — Assessment & Plan Note (Signed)
History of CAD status post multiple interventions the past. He had PCI to circumflex in 1994 and 1995  which I performed. He had a abnormal Myoview August 2017 notable for inferior ischemia which led to a cardiac catheterization performed by Dr. [name] reviewing a 90% mid RCA stenosis which was stented with a Synergy eluting stent. He did have a 90% small ramus branch stenosis and a 70% mid to proximal LAD. This does not appear to be ischemic on his Myoview stress test and his symptoms have resolved. He is on dual antibiotic therapy. We will continue to follow him medically.

## 2016-04-03 NOTE — Patient Instructions (Signed)
Medication Instructions: Your physician recommends that you continue on your current medications as directed. Please refer to the Current Medication list given to you today.   Follow-Up: Your physician recommends that you schedule a follow-up appointment in: 6 months with [name], NP and Dr. [name] in 1 year. You will receive a reminder letter in the mail two months in advance. If you don't receive a letter, please call our office to schedule the follow-up appointment.  If you need a refill on your cardiac medications before your next appointment, please call your pharmacy.

## 2016-04-03 NOTE — Assessment & Plan Note (Signed)
History of hypertension blood pressure measured at 130/60. He is on amlodipine, Avalide. Continue current meds at current dosing

## 2016-04-03 NOTE — Assessment & Plan Note (Signed)
History of hyperlipidemia on statin therapy with recent lipid profile performed 02/13/16 revealed a total cholesterol 122, LDL 47 and HDL 46.

## 2016-04-03 NOTE — Progress Notes (Signed)
04/03/2016 Dillon Young   27-Mar-1933  [id number]  Primary Physician Dillon Calico, MD Primary Cardiologist: Dillon Harp MD Dillon Young  HPI:  Dillon Young is a delightful 82 year old married Caucasian male father of 2 children, grandfather to 2 grandchildren he was formally a patient of Dillon Young Lowella Fairy. 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 He has a history of coronary artery disease status post angioplasty in 1994 and 1995 Of his circumflex coronary artery. He has not required cardiac catheterization since stress Myoview was performed in 2011 that was low risk and nonischemic. He has a history of hypertension and hyperlipidemia on medication. He had right carotid endarterectomy performed by Dillon Young Early June 2013 followed by duplex ultrasound. He denies chest pain or shortness of breath. His major complaint is of cramping in his thighs which may be related to his statin drug. He was given a statin holiday which really did not impact his symptoms and therefore statin was restarted and follow-up lipid profile performed 07/05/14 revealed a total cholesterol 131, LDL 58 and HDL of 44. Recent carotid Dopplers revealed his endarterectomy site was widely patent with moderate left ICA stenosis and moderate left subclavian artery stenosis without symptoms. He developed symptoms in August of this year and saw Dillon Young. A Myoview stress test performed 01/09/16 was notable for ischemia in the RCA distribution. Based on this he underwent cardiac catheterization by Dillon Young 01/16/16 revealing a 90% segmental mid dominant RCA stenosis which was stented with a Synergy 3.5 mm x 20 mm long drug-eluting stent. He did have a very small ramus branch that was 90% stenosed and not suitable for intervention as well as a 70% mid LAD lesion although he had no evidence of anterior ischemia on his stress test. His  symptoms of chest pain have resolved. His major complaints now are of low back pain.   Current Outpatient Prescriptions  Medication Sig Dispense Refill  . acetaminophen (TYLENOL) 325 MG tablet Take 2 tablets (650 mg total) by mouth every 4 (four) hours as needed for headache or mild pain.    Marland Kitchen amLODipine (NORVASC) 10 MG tablet Take 1 tablet (10 mg total) by mouth daily. 90 tablet 3  . aspirin EC 81 MG tablet Take 81 mg by mouth every evening.     . benzonatate (TESSALON) 200 MG capsule Take 1 capsule (200 mg total) by mouth 3 (three) times daily as needed for cough. 30 capsule 0  . cholecalciferol (VITAMIN D) 1000 UNITS tablet Take 1,000 Units by mouth daily.      . clopidogrel (PLAVIX) 75 MG tablet Take 1 tablet (75 mg total) by mouth daily with breakfast. 90 tablet 3  . diclofenac sodium (VOLTAREN) 1 % GEL Apply 2 g topically 4 (four) times daily. 100 g 11  . dicyclomine (BENTYL) 20 MG tablet TAKE 1 TABLET (20 MG TOTAL) BY MOUTH EVERY 6 (SIX) HOURS AS NEEDED FOR SPASMS. 30 tablet 2  . Doxepin HCl (SILENOR) 3 MG TABS Take 1 tablet (3 mg total) by mouth at bedtime as needed (sleep).    . fluticasone (FLONASE) 50 MCG/ACT nasal spray Place 1-2 sprays into both nostrils daily as needed for allergies. 48 g 1  . hydrocortisone (ANUSOL-HC) 2.5 % rectal cream Place 1 application rectally 2 (two) times daily as needed for hemorrhoids.     . irbesartan-hydrochlorothiazide (AVALIDE) 300-12.5 MG tablet  Take 1 tablet by mouth daily. 90 tablet 1  . Melatonin 5 MG CAPS Take 1 capsule (5 mg total) by mouth daily as needed (sleep).    . nitroGLYCERIN (NITROSTAT) 0.4 MG SL tablet Place 1 tablet (0.4 mg total) under the tongue every 5 (five) minutes as needed for chest pain. 25 tablet 3  . polyethylene glycol powder (GLYCOLAX/MIRALAX) powder Take 17 g by mouth as needed. (Patient taking differently: Take 17 g by mouth daily as needed for mild constipation. ) 255 g 3  . rosuvastatin (CRESTOR) 10 MG tablet Take 5 mg  by mouth daily.    . vitamin C (ASCORBIC ACID) 500 MG tablet Take 500 mg by mouth daily.     . Wheat Dextrin (BENEFIBER) POWD 2 tablespoons a day (Patient taking differently: Take 10 mLs by mouth daily. )     No current facility-administered medications for this visit.     Allergies  Allergen Reactions  . Lipitor [Atorvastatin] Other (See Comments)    myalgias    Social History   Social History  . Marital status: Married    Spouse name: N/A  . Number of children: 2  . Years of education: N/A   Occupational History  . [employer]    Social History Main Topics  . Smoking status: Former Smoker    Packs/day: 1.50    Years: 60.00    Types: Cigarettes    Quit date: 07/25/2008  . Smokeless tobacco: Never Used  . Alcohol use 0.0 oz/week     Comment: Occ  . Drug use: No  . Sexual activity: Not on file   Other Topics Concern  . Not on file   Social History Narrative  . No narrative on file     Review of Systems: General: negative for chills, fever, night sweats or weight changes.  Cardiovascular: negative for chest pain, dyspnea on exertion, edema, orthopnea, palpitations, paroxysmal nocturnal dyspnea or shortness of breath Dermatological: negative for rash Respiratory: negative for cough or wheezing Urologic: negative for hematuria Abdominal: negative for nausea, vomiting, diarrhea, bright red blood per rectum, melena, or hematemesis Neurologic: negative for visual changes, syncope, or dizziness All other systems reviewed and are otherwise negative except as noted above.    Blood pressure 130/60, pulse 76, height 5\' 8"  (1.727 m), weight 185 lb 6.4 oz (84.1 kg).  General appearance: alert and no distress Neck: no adenopathy, no JVD, supple, symmetrical, trachea midline, thyroid not enlarged, symmetric, no tenderness/mass/nodules and Soft left carotid bruit Lungs: clear to auscultation bilaterally Heart: regular rate and rhythm, S1, S2 normal, no murmur,  click, rub or gallop Extremities: extremities normal, atraumatic, no cyanosis or edema  EKG not performed today  ASSESSMENT AND PLAN:   Hyperlipidemia with target LDL less than 70 History of hyperlipidemia on statin therapy with recent lipid profile performed 02/13/16 revealed a total cholesterol 122, LDL 47 and HDL 46.  Essential hypertension History of hypertension blood pressure measured at 130/60. He is on amlodipine, Avalide. Continue current meds at current dosing  CAD S/P multiple PCIs History of CAD status post multiple interventions the past. He had PCI to circumflex in 1994 and 1995  which I performed. He had a abnormal Myoview August 2017 notable for inferior ischemia which led to a cardiac catheterization performed by Dr. [name] reviewing a 90% mid RCA stenosis which was stented with a Synergy eluting stent. He did have a 90% small ramus branch stenosis and a 70% mid to proximal LAD. This  does not appear to be ischemic on his Myoview stress test and his symptoms have resolved. He is on dual antibiotic therapy. We will continue to follow him medically.  Cerebrovascular disease History of peripheral arterial disease status post right carotid endarterectomy performed by Dillon Young Early June 2013 which we followed by duplex ultrasound.      Dillon Harp MD FACP,FACC,FAHA, [facility] 04/03/2016 9:10 AM

## 2016-04-11 ENCOUNTER — Ambulatory Visit (INDEPENDENT_AMBULATORY_CARE_PROVIDER_SITE_OTHER): Payer: PPO | Admitting: Internal Medicine

## 2016-04-11 ENCOUNTER — Encounter: Payer: Self-pay | Admitting: Internal Medicine

## 2016-04-11 ENCOUNTER — Other Ambulatory Visit (INDEPENDENT_AMBULATORY_CARE_PROVIDER_SITE_OTHER): Payer: PPO

## 2016-04-11 VITALS — BP 140/60 | HR 70 | Temp 97.8°F | Resp 16 | Ht 68.0 in | Wt 181.6 lb

## 2016-04-11 DIAGNOSIS — E119 Type 2 diabetes mellitus without complications: Secondary | ICD-10-CM

## 2016-04-11 DIAGNOSIS — I10 Essential (primary) hypertension: Secondary | ICD-10-CM | POA: Diagnosis not present

## 2016-04-11 LAB — BASIC METABOLIC PANEL
BUN: 22 mg/dL (ref 6–23)
CHLORIDE: 103 meq/L (ref 96–112)
CO2: 30 meq/L (ref 19–32)
CREATININE: 1.12 mg/dL (ref 0.40–1.50)
Calcium: 9.9 mg/dL (ref 8.4–10.5)
GFR: 66.76 mL/min (ref >60.00)
GLUCOSE: 145 mg/dL — AB (ref 70–99)
POTASSIUM: 4.6 meq/L (ref 3.5–5.1)
Sodium: 141 mEq/L (ref 135–145)

## 2016-04-11 LAB — HEMOGLOBIN A1C: HEMOGLOBIN A1C: 6.6 % — AB (ref 4.6–6.5)

## 2016-04-11 NOTE — Patient Instructions (Signed)

## 2016-04-11 NOTE — Progress Notes (Signed)
Pre visit review using our clinic review tool, if applicable. No additional management support is needed unless otherwise documented below in the visit note. 

## 2016-04-11 NOTE — Progress Notes (Signed)
Subjective:  Patient ID: Dillon Young, male    DOB: [birth year]  Age: 82 y.o. MRN: [id number]  CC: Hypertension and Diabetes   HPI Dillon Young presents for follow-up on hypertension and diabetes. He thinks his blood sugars are well controlled. He has had no recent episodes of polys or visual disturbance. His blood pressure has also been well controlled and he has had no headache, chest pain, shortness of breath, palpitations, edema, or fatigue.  Outpatient Medications Prior to Visit  Medication Sig Dispense Refill  . acetaminophen (TYLENOL) 325 MG tablet Take 2 tablets (650 mg total) by mouth every 4 (four) hours as needed for headache or mild pain.    Marland Kitchen amLODipine (NORVASC) 10 MG tablet Take 1 tablet (10 mg total) by mouth daily. 90 tablet 3  . aspirin EC 81 MG tablet Take 81 mg by mouth every evening.     . cholecalciferol (VITAMIN D) 1000 UNITS tablet Take 1,000 Units by mouth daily.      . clopidogrel (PLAVIX) 75 MG tablet Take 1 tablet (75 mg total) by mouth daily with breakfast. 90 tablet 3  . diclofenac sodium (VOLTAREN) 1 % GEL Apply 2 g topically 4 (four) times daily. 100 g 11  . dicyclomine (BENTYL) 20 MG tablet TAKE 1 TABLET (20 MG TOTAL) BY MOUTH EVERY 6 (SIX) HOURS AS NEEDED FOR SPASMS. 30 tablet 2  . Doxepin HCl (SILENOR) 3 MG TABS Take 1 tablet (3 mg total) by mouth at bedtime as needed (sleep).    . fluticasone (FLONASE) 50 MCG/ACT nasal spray Place 1-2 sprays into both nostrils daily as needed for allergies. 48 g 1  . hydrocortisone (ANUSOL-HC) 2.5 % rectal cream Place 1 application rectally 2 (two) times daily as needed for hemorrhoids.     . irbesartan-hydrochlorothiazide (AVALIDE) 300-12.5 MG tablet Take 1 tablet by mouth daily. 90 tablet 1  . Melatonin 5 MG CAPS Take 1 capsule (5 mg total) by mouth daily as needed (sleep).    . polyethylene glycol powder (GLYCOLAX/MIRALAX) powder Take 17 g by mouth as needed. (Patient taking differently: Take 17 g by mouth daily  as needed for mild constipation. ) 255 g 3  . rosuvastatin (CRESTOR) 10 MG tablet Take 5 mg by mouth daily.    . vitamin C (ASCORBIC ACID) 500 MG tablet Take 500 mg by mouth daily.     . Wheat Dextrin (BENEFIBER) POWD 2 tablespoons a day (Patient taking differently: Take 10 mLs by mouth daily. )    . benzonatate (TESSALON) 200 MG capsule Take 1 capsule (200 mg total) by mouth 3 (three) times daily as needed for cough. 30 capsule 0  . nitroGLYCERIN (NITROSTAT) 0.4 MG SL tablet Place 1 tablet (0.4 mg total) under the tongue every 5 (five) minutes as needed for chest pain. (Patient not taking: Reported on 04/11/2016) 25 tablet 3   No facility-administered medications prior to visit.     ROS Review of Systems  Constitutional: Negative.  Negative for activity change, appetite change, chills, diaphoresis, fatigue and fever.  HENT: Negative.  Negative for trouble swallowing and voice change.   Eyes: Negative for visual disturbance.  Respiratory: Negative for cough, chest tightness, shortness of breath, wheezing and stridor.   Cardiovascular: Negative.  Negative for chest pain, palpitations and leg swelling.  Gastrointestinal: Negative.  Negative for abdominal pain.  Endocrine: Negative for cold intolerance, heat intolerance, polydipsia, polyphagia and polyuria.  Genitourinary: Negative.   Musculoskeletal: Negative.  Negative for back pain, myalgias  and neck pain.  Skin: Negative.  Negative for color change and rash.  Neurological: Negative.  Negative for dizziness, weakness, light-headedness, numbness and headaches.  Hematological: Negative.  Negative for adenopathy. Does not bruise/bleed easily.  Psychiatric/Behavioral: Negative.     Objective:  BP 140/60 (BP Location: Left Arm, Patient Position: Sitting, Cuff Size: Normal)   Pulse 70   Temp 97.8 F (36.6 C) (Oral)   Resp 16   Ht 5\' 8"  (1.727 m)   Wt 181 lb 10 oz (82.4 kg)   SpO2 97%   BMI 27.62 kg/m   BP Readings from Last 3  Encounters:  04/11/16 140/60  04/03/16 130/60  03/31/16 (!) 148/72    Wt Readings from Last 3 Encounters:  04/11/16 181 lb 10 oz (82.4 kg)  04/03/16 185 lb 6.4 oz (84.1 kg)  03/31/16 185 lb (83.9 kg)    Physical Exam  Constitutional: He is oriented to person, place, and time. No distress.  HENT:  Mouth/Throat: Oropharynx is clear and moist. No oropharyngeal exudate.  Eyes: Conjunctivae are normal. Right eye exhibits no discharge. Left eye exhibits no discharge. No scleral icterus.  Neck: Normal range of motion. No JVD present. No tracheal deviation present. No thyromegaly present.  Cardiovascular: Normal rate, regular rhythm, normal heart sounds and intact distal pulses.  Exam reveals no gallop and no friction rub.   No murmur heard. Pulmonary/Chest: Effort normal and breath sounds normal. No stridor. No respiratory distress. He has no wheezes. He has no rales. He exhibits no tenderness.  Abdominal: Soft. Bowel sounds are normal. He exhibits no distension and no mass. There is no tenderness. There is no rebound and no guarding.  Musculoskeletal: Normal range of motion. He exhibits no edema, tenderness or deformity.  Lymphadenopathy:    He has no cervical adenopathy.  Neurological: He is oriented to person, place, and time.  Skin: Skin is warm and dry. No rash noted. He is not diaphoretic. No erythema. No pallor.  Vitals reviewed.   Lab Results  Component Value Date   WBC 8.3 01/17/2016   HGB 14.6 01/17/2016   HCT 45.2 01/17/2016   PLT 171 01/17/2016   GLUCOSE 145 (H) 04/11/2016   CHOL 122 (L) 02/13/2016   TRIG 144 02/13/2016   HDL 46 02/13/2016   LDLCALC 47 02/13/2016   ALT 13 02/13/2016   AST 16 02/13/2016   NA 141 04/11/2016   K 4.6 04/11/2016   CL 103 04/11/2016   CREATININE 1.12 04/11/2016   BUN 22 04/11/2016   CO2 30 04/11/2016   TSH 1.63 12/15/2015   PSA 0.01 (L) 07/05/2014   INR 1.06 01/15/2016   HGBA1C 6.6 (H) 04/11/2016   MICROALBUR 6.1 (H) 08/15/2015     Ct Lumbar Spine Wo Contrast  Result Date: 02/29/2016 CLINICAL DATA:  Low back pain for 1 year. BILATERAL leg pain and weakness. EXAM: CT LUMBAR SPINE WITHOUT CONTRAST TECHNIQUE: Multidetector CT imaging of the lumbar spine was performed without intravenous contrast administration. Multiplanar CT image reconstructions were also generated. COMPARISON:  None. FINDINGS: Segmentation: 5 lumbar type vertebrae. Alignment: Normal. Vertebrae: No acute fracture or focal pathologic process. Paraspinal and other soft tissues: Aortic atherosclerosis. Disc levels: L1-L2:  Unremarkable. L2-L3:  Annular bulge with facet arthropathy. L3-L4: Annular bulge with facet arthropathy. BILATERAL foraminal narrowing, worse on the LEFT. L4-L5: LEFT laminotomy, remote. Osseous spurring extends to the LEFT subarticular and foraminal zones. Facet arthropathy. Mild stenosis. LEFT greater than RIGHT L4 and L5 nerve root impingement are possible. L5-S1:  Advanced facet arthropathy. Annular bulging. No definite impingement. IMPRESSION: Postsurgical changes at L4-5 on the LEFT; LEFT greater than RIGHT subarticular zone and foraminal zone narrowing at that level are observed. Mild lumbar spondylosis at L2 through S1. Aortic atherosclerosis. Anatomic alignment without worrisome osseous lesion. Electronically Signed   By: Staci Righter M.D.   On: 02/29/2016 10:07    Assessment & Plan:   Taiwo was seen today for hypertension and diabetes.  Diagnoses and all orders for this visit:  Essential hypertension- His blood pressure is well controlled, electrolytes and renal function are stable. -     Basic metabolic panel; Future  Diabetes mellitus without complication ([place])- his [id number] is at 6.6%, his blood sugars are adequately well controlled. -     Basic metabolic panel; Future -     Hemoglobin A1c; Future   I have discontinued Mr. Dunshee's benzonatate. I am also having him maintain his vitamin C, cholecalciferol, aspirin EC, BENEFIBER,  polyethylene glycol powder, hydrocortisone, nitroGLYCERIN, fluticasone, acetaminophen, amLODipine, clopidogrel, Doxepin HCl, Melatonin, rosuvastatin, dicyclomine, diclofenac sodium, and irbesartan-hydrochlorothiazide.  No orders of the defined types were placed in this encounter.    Follow-up: Return in about 6 months (around 10/09/2016).  [name], MD

## 2016-04-26 ENCOUNTER — Ambulatory Visit: Payer: PPO | Admitting: Physician Assistant

## 2016-04-26 NOTE — Progress Notes (Deleted)
Cardiology Office Note   Date:  04/26/2016   ID:  Dillon Young, Dillon Young Oct 18, 1932, MRN [id number]  PCP:  [name], MD  Cardiologist:  Dr [name] 04/03/2016  [name], PA-C   No chief complaint on file.   History of Present Illness: Dillon Young is a 82 y.o. male with a history of PCI CFX '94 & '95, HTN, HLD, R-CEA, DES RCA 12/2015 w/ med rx for 90% RI & 70% mLAD, DM, BPH, GERD  Dillon Young presents for ***   Past Medical History:  Diagnosis Date  . Adenomatous colon polyp   . Allergic rhinitis   . BPH (benign prostatic hyperplasia)   . CAD (coronary artery disease)    sees Dr. [name]   . Carotid artery disease ([place])   . CHF (congestive heart failure) ([place])   . Chronic kidney disease    [place]. Prostate cancer  . Clotting disorder ([place])   . CVA (cerebral infarction)   . Diabetes mellitus    "Borderline"  . Diverticulitis   . GERD (gastroesophageal reflux disease)    history  . Hemorrhoids   . HTN (hypertension)   . Hyperlipidemia    echo 10-25-2010 mild abnormalities  . IBS (irritable bowel syndrome)   . Low back pain   . Prostate cancer ([place]) 2008   seed implantation  . Sleep apnea    last study was aborted.  Has never worn a CPAP.    Marland Kitchen Smoker     Past Surgical History:  Procedure Laterality Date  . APPENDECTOMY    . CARDIAC CATHETERIZATION N/A 01/16/2016   Procedure: Left Heart Cath and Coronary Angiography;  Surgeon: Jettie Booze, MD;  Location: [place] [facility];  Service: Cardiovascular;  Laterality: N/A;  . CARDIAC CATHETERIZATION N/A 01/16/2016   Procedure: Coronary Stent Intervention;  Surgeon: Jettie Booze, MD;  Location: [place] [facility];  Service: Cardiovascular;  Laterality: N/A;  . Cataract with Lens     Bilateral  . COLONOSCOPY  10/21/2008   diverticulosis, radiation proctitis, internal hemorrhoids  . ENDARTERECTOMY  11/12/2011   Procedure: ENDARTERECTOMY CAROTID;  Surgeon: Rosetta Posner, MD;   Location: [facility];  Service: Vascular;  Laterality: Right;  . Esophageal dilitation    . HEMORRHOID BANDING    . Lithrotripsy      x 3  . LUMBAR LAMINECTOMY    . TRANSURETHRAL RESECTION OF PROSTATE  10/2003    Current Outpatient Prescriptions  Medication Sig Dispense Refill  . acetaminophen (TYLENOL) 325 MG tablet Take 2 tablets (650 mg total) by mouth every 4 (four) hours as needed for headache or mild pain.    Marland Kitchen amLODipine (NORVASC) 10 MG tablet Take 1 tablet (10 mg total) by mouth daily. 90 tablet 3  . aspirin EC 81 MG tablet Take 81 mg by mouth every evening.     . cholecalciferol (VITAMIN D) 1000 UNITS tablet Take 1,000 Units by mouth daily.      . clopidogrel (PLAVIX) 75 MG tablet Take 1 tablet (75 mg total) by mouth daily with breakfast. 90 tablet 3  . diclofenac sodium (VOLTAREN) 1 % GEL Apply 2 g topically 4 (four) times daily. 100 g 11  . dicyclomine (BENTYL) 20 MG tablet TAKE 1 TABLET (20 MG TOTAL) BY MOUTH EVERY 6 (SIX) HOURS AS NEEDED FOR SPASMS. 30 tablet 2  . Doxepin HCl (SILENOR) 3 MG TABS Take 1 tablet (3 mg total) by mouth at bedtime as needed (sleep).    Marland Kitchen  fluticasone (FLONASE) 50 MCG/ACT nasal spray Place 1-2 sprays into both nostrils daily as needed for allergies. 48 g 1  . hydrocortisone (ANUSOL-HC) 2.5 % rectal cream Place 1 application rectally 2 (two) times daily as needed for hemorrhoids.     . irbesartan-hydrochlorothiazide (AVALIDE) 300-12.5 MG tablet Take 1 tablet by mouth daily. 90 tablet 1  . Melatonin 5 MG CAPS Take 1 capsule (5 mg total) by mouth daily as needed (sleep).    . nitroGLYCERIN (NITROSTAT) 0.4 MG SL tablet Place 1 tablet (0.4 mg total) under the tongue every 5 (five) minutes as needed for chest pain. (Patient not taking: Reported on 04/11/2016) 25 tablet 3  . polyethylene glycol powder (GLYCOLAX/MIRALAX) powder Take 17 g by mouth as needed. (Patient taking differently: Take 17 g by mouth daily as needed for mild constipation. ) 255 g 3  .  rosuvastatin (CRESTOR) 10 MG tablet Take 5 mg by mouth daily.    . vitamin C (ASCORBIC ACID) 500 MG tablet Take 500 mg by mouth daily.     . Wheat Dextrin (BENEFIBER) POWD 2 tablespoons a day (Patient taking differently: Take 10 mLs by mouth daily. )     No current facility-administered medications for this visit.     Allergies:   Lipitor [atorvastatin]    Social History:  The patient  reports that he quit smoking about 7 years ago. His smoking use included Cigarettes. He has a 90.00 pack-year smoking history. He has never used smokeless tobacco. He reports that he drinks alcohol. He reports that he does not use drugs.   Family History:  The patient's family history includes Heart disease in his father.    ROS:  Please see the history of present illness. All other systems are reviewed and negative.    PHYSICAL EXAM: VS:  There were no vitals taken for this visit. , BMI There is no height or weight on file to calculate BMI. GEN: Well nourished, well developed, male in no acute distress  HEENT: normal for age  Neck: no JVD, no carotid bruit, no masses Cardiac: RRR; no murmur, no rubs, or gallops Respiratory:  clear to auscultation bilaterally, normal work of breathing GI: soft, nontender, nondistended, + BS MS: no deformity or atrophy; no edema; distal pulses are 2+ in all 4 extremities   Skin: warm and dry, no rash Neuro:  Strength and sensation are intact Psych: euthymic mood, full affect   EKG:  EKG {ACTION; IS/IS GI:[id number] ordered today. The ekg ordered today demonstrates ***   Recent Labs: 12/15/2015: TSH 1.63 01/17/2016: Hemoglobin 14.6; Platelets 171 02/13/2016: ALT 13 04/11/2016: BUN 22; Creatinine, Ser 1.12; Potassium 4.6; Sodium 141    Lipid Panel    Component Value Date/Time   CHOL 122 (L) 02/13/2016 0826   TRIG 144 02/13/2016 0826   HDL 46 02/13/2016 0826   CHOLHDL 2.7 02/13/2016 0826   VLDL 29 02/13/2016 0826   LDLCALC 47 02/13/2016 0826     Wt  Readings from Last 3 Encounters:  04/11/16 181 lb 10 oz (82.4 kg)  04/03/16 185 lb 6.4 oz (84.1 kg)  03/31/16 185 lb (83.9 kg)     Other studies Reviewed: Additional studies/ records that were reviewed today include: ***.  ASSESSMENT AND PLAN:  1.  ***   Current medicines are reviewed at length with the patient today.  The patient {ACTIONS; HAS/DOES NOT HAVE:19233} concerns regarding medicines.  The following changes have been made:  {PLAN; NO CHANGE:13088:s}  Labs/ tests ordered today include: ***  No orders of the defined types were placed in this encounter.    Disposition:   FU with ***  Signed, [name], PA-C  04/26/2016 8:18 AM    [place] Phone: [phone]; Fax: [phone]  This note was written with the assistance of speech recognition software. Please excuse any transcriptional errors.

## 2016-05-03 ENCOUNTER — Other Ambulatory Visit: Payer: Self-pay | Admitting: Emergency Medicine

## 2016-05-03 ENCOUNTER — Telehealth: Payer: Self-pay | Admitting: Internal Medicine

## 2016-05-03 ENCOUNTER — Other Ambulatory Visit (INDEPENDENT_AMBULATORY_CARE_PROVIDER_SITE_OTHER): Payer: PPO

## 2016-05-03 DIAGNOSIS — K625 Hemorrhage of anus and rectum: Secondary | ICD-10-CM | POA: Diagnosis not present

## 2016-05-03 LAB — CBC WITH DIFFERENTIAL/PLATELET
Basophils Absolute: 0 10*3/uL (ref 0.0–0.1)
Basophils Relative: 0.4 % (ref 0.0–3.0)
EOS ABS: 0.1 10*3/uL (ref 0.0–0.7)
EOS PCT: 2 % (ref 0.0–5.0)
HCT: 37.6 % — ABNORMAL LOW (ref 39.0–52.0)
Hemoglobin: 12.7 g/dL — ABNORMAL LOW (ref 13.0–17.0)
LYMPHS ABS: 1.1 10*3/uL (ref 0.7–4.0)
Lymphocytes Relative: 19.1 % (ref 12.0–46.0)
MCHC: 33.8 g/dL (ref 30.0–36.0)
MCV: 85.5 fl (ref 78.0–100.0)
MONO ABS: 0.6 10*3/uL (ref 0.1–1.0)
Monocytes Relative: 10.8 % (ref 3.0–12.0)
NEUTROS PCT: 67.7 % (ref 43.0–77.0)
Neutro Abs: 3.8 10*3/uL (ref 1.4–7.7)
Platelets: 240 10*3/uL (ref 150.0–400.0)
RBC: 4.4 Mil/uL (ref 4.22–5.81)
RDW: 13.6 % (ref 11.5–15.5)
WBC: 5.6 10*3/uL (ref 4.0–10.5)

## 2016-05-03 MED ORDER — HYDROCORTISONE 2.5 % RE CREA
1.0000 | TOPICAL_CREAM | Freq: Two times a day (BID) | RECTAL | 0 refills | Status: DC
Start: 2016-05-03 — End: 2016-05-09

## 2016-05-03 NOTE — Telephone Encounter (Signed)
Patient reports that he is on Plavix and has had several days of bright red rectal bleeding with a BM.  I advised him to ensure stools are soft by using Miralax or Colace.  Dr. Carlean Purl does he need anything until office visit next Wed.

## 2016-05-03 NOTE — Telephone Encounter (Signed)
He has a hx of radiation proctitis and hemorrhoids both of which may be causing  Plan:  1) CBC re: rectal bleeding 2) 2.5 % rectal hydrocortisone cream # 30 g bid 1 tube

## 2016-05-03 NOTE — Telephone Encounter (Signed)
Patient's wife notified She will have him come for labs and pick up rx

## 2016-05-03 NOTE — Progress Notes (Signed)
Hemoglobin slightly low  I suggest he hold Plavix til seen next week If severe bleeding go to ED Stay on ASA

## 2016-05-07 ENCOUNTER — Telehealth: Payer: Self-pay | Admitting: Cardiovascular Disease

## 2016-05-07 ENCOUNTER — Telehealth: Payer: Self-pay | Admitting: Internal Medicine

## 2016-05-07 NOTE — Telephone Encounter (Signed)
It is too soon to stop Plavix given DES in late August. Concern about potential stent thrombosis. Bleeding sounds hemorrhoidal.  Would resume Plavix. Keep appt. With Dr. Carlean Purl.  Dillon Young [place] MD, [facility]

## 2016-05-07 NOTE — Telephone Encounter (Signed)
Pt of Dr. [name] Hx CAD. Recent cath w stent placement (August 2017)  Spoke w patient. Has history of problematic hemorrhoids, recently had bout of constipation, rectal bleeding. Placed a call to Dr. Carlean Purl, his GI, who checked CBC - Hgb was low (12.7 on review of lab results) Held plavix since Saturday at physician instruction. Has follow up w Dr. Carlean Purl on Wednesday.  Noteably, his symptoms have stopped (no bleeding since day he went off Plavix).  Patient wants to make sure Dr. [name] is aware and seeks instruction on Plavix use - resume or continue to hold. Dr. [name] out of office. Routed to [employer].

## 2016-05-07 NOTE — Telephone Encounter (Signed)
Patient notified he will keep the appt

## 2016-05-07 NOTE — Telephone Encounter (Signed)
Patient reports that his cardiologist told him to stay on Plavix despite your recommendations last week.  He has not seen any bleeding for the last 3 days.  He wonders if he needs to keep the follow up with you on 05/09/16?

## 2016-05-07 NOTE — Telephone Encounter (Signed)
Mr. Conkey is calling because he is having some bleeding and his gastro doctor has told him to go off of his Plavix . Please call

## 2016-05-07 NOTE — Telephone Encounter (Signed)
Glad to hear bleeding has stopped.  I think best that I check him and he keep appointment but its up to him.  At a minimum would recheck CBC in 1 month

## 2016-05-07 NOTE — Telephone Encounter (Signed)
Patient advised & voiced understanding regarding rationale for daily use of Plavix w/o interruption. He will resume plavix today. I've encouraged patient to call if he has further needs or concerns.

## 2016-05-09 ENCOUNTER — Ambulatory Visit (INDEPENDENT_AMBULATORY_CARE_PROVIDER_SITE_OTHER): Payer: PPO | Admitting: Internal Medicine

## 2016-05-09 ENCOUNTER — Telehealth: Payer: Self-pay | Admitting: Cardiovascular Disease

## 2016-05-09 ENCOUNTER — Encounter: Payer: Self-pay | Admitting: Internal Medicine

## 2016-05-09 VITALS — BP 132/50 | HR 64 | Ht 68.0 in | Wt 185.0 lb

## 2016-05-09 DIAGNOSIS — Z79899 Other long term (current) drug therapy: Secondary | ICD-10-CM

## 2016-05-09 DIAGNOSIS — D649 Anemia, unspecified: Secondary | ICD-10-CM

## 2016-05-09 DIAGNOSIS — E7849 Other hyperlipidemia: Secondary | ICD-10-CM

## 2016-05-09 DIAGNOSIS — K648 Other hemorrhoids: Secondary | ICD-10-CM

## 2016-05-09 NOTE — Telephone Encounter (Signed)
Recommend lipid panel/hepatic panel about 3 months after change back to 10mg .

## 2016-05-09 NOTE — Progress Notes (Signed)
   Dillon Young 82 y.o. [birth year] [id number]  Assessment & Plan:   Encounter Diagnoses  Name Primary?  . Hemorrhoids, internal, with bleeding, prolapse and itching   . Mild anemia Yes   Hemorrhoids, internal, with bleeding, prolapse and itching Recurrent bleeding when constipated and on Plavix Better now RP and LL inflamed and swollen/G2 prolapse Continue his fiber and prn HC cream If becomes more chronic problem consider rebanding hen he could hold Plavix   Recheck CBC early Jan 2018   Subjective:   Chief Complaint: rectal bleeding  HPI Had been doing veryl well then started to have some constipation after starting clopidogrel s/p drug-eluting stent. Called last week with rectal bleeding. Had him start hydrocortisone cream, Hgb down slightly - bleeding has stopped. He has increased fiber supplement and no hard stools/straining now. S/p banding of RP and LL hemorrhoids - I had avoided RA given XRT for prostate ca and XRT proctitis  Medications, allergies, past medical history, past surgical history, family history and social history are reviewed and updated in the EMR.  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

## 2016-05-09 NOTE — Telephone Encounter (Signed)
Chart lists his crestor dose as 10mg  daily. No changes need to be made, will route to pharmD to see if any follow up action recommended.

## 2016-05-09 NOTE — Telephone Encounter (Signed)
New message  FYI  Pt has been on Crestor/84mos, 10mg /was having issues/then cut to 5mg   Pt has found out that his issues are from something else  Pt has gone back to 10mg   Please chg back to 10mg  on chart

## 2016-05-09 NOTE — Patient Instructions (Addendum)
   Please come to our lab after 05/28/2016 and have a CBC drawn.  The lab is open 7:30AM-5:30PM, no appointment is needed.    Continue your fiber.   Continue using your hydrocortisone rectal cream as needed.    I appreciate the opportunity to care for you. Silvano Rusk, MD, [facility]

## 2016-05-09 NOTE — Assessment & Plan Note (Signed)
Recurrent bleeding when constipated and on Plavix Better now RP and LL inflamed and swollen/G2 prolapse Continue his fiber and prn HC cream If becomes more chronic problem consider rebanding hen he could hold Plavix

## 2016-05-09 NOTE — Telephone Encounter (Signed)
Spoke to patient regarding recommendations. He voiced understanding and notes he will get bloodwork recheck in March (fasting) same day he sees Suanne Marker for his f/u. Aware to call sooner if new concerns or needs.  Labwork ordered.

## 2016-05-10 ENCOUNTER — Ambulatory Visit: Payer: PPO | Admitting: Physician Assistant

## 2016-05-17 ENCOUNTER — Telehealth: Payer: Self-pay | Admitting: Cardiovascular Disease

## 2016-05-17 NOTE — Telephone Encounter (Signed)
New message  Pt is calling in regards to meds  Pt is having issues w/pain  Pt states he needs something for pain  Please call back

## 2016-05-17 NOTE — Telephone Encounter (Signed)
Discussed with pt-neck, shoulder joint and leg pain, informed to call PCP for pain medication

## 2016-05-29 ENCOUNTER — Telehealth: Payer: Self-pay | Admitting: Cardiovascular Disease

## 2016-05-29 MED ORDER — ROSUVASTATIN CALCIUM 10 MG PO TABS: 10.0000 mg | ORAL_TABLET | Freq: Every day | ORAL | 6 refills | Status: DC

## 2016-05-29 NOTE — Telephone Encounter (Signed)
Rx sent electronically.  

## 2016-05-29 NOTE — Telephone Encounter (Signed)
New Message   *STAT* If patient is at the pharmacy, call can be transferred to refill team.   1. Which medications need to be refilled? (please list name of each medication and dose if known)   rosuvastatin (CRESTOR) 10 MG tablet     2. Which pharmacy/location (including street and city if local pharmacy) is medication to be sent to? [facility] [place], [place]  3. Do they need a 30 day or 90 day supply? Unsure

## 2016-06-15 ENCOUNTER — Ambulatory Visit (HOSPITAL_COMMUNITY)
Admission: RE | Admit: 2016-06-15 | Discharge: 2016-06-15 | Disposition: A | Discharge status: Home / Self Care | Payer: PPO | Source: Ambulatory Visit | Attending: Cardiology | Admitting: Cardiology

## 2016-06-15 DIAGNOSIS — I6522 Occlusion and stenosis of left carotid artery: Secondary | ICD-10-CM

## 2016-06-15 DIAGNOSIS — I6523 Occlusion and stenosis of bilateral carotid arteries: Secondary | ICD-10-CM | POA: Diagnosis not present

## 2016-06-18 ENCOUNTER — Other Ambulatory Visit: Payer: Self-pay | Admitting: Cardiovascular Disease

## 2016-06-18 DIAGNOSIS — I779 Disorder of arteries and arterioles, unspecified: Secondary | ICD-10-CM

## 2016-06-18 DIAGNOSIS — I739 Peripheral vascular disease, unspecified: Principal | ICD-10-CM

## 2016-07-02 DIAGNOSIS — Z961 Presence of intraocular lens: Secondary | ICD-10-CM | POA: Diagnosis not present

## 2016-07-02 DIAGNOSIS — H52203 Unspecified astigmatism, bilateral: Secondary | ICD-10-CM | POA: Diagnosis not present

## 2016-07-02 DIAGNOSIS — H26491 Other secondary cataract, right eye: Secondary | ICD-10-CM | POA: Diagnosis not present

## 2016-07-02 DIAGNOSIS — H01001 Unspecified blepharitis right upper eyelid: Secondary | ICD-10-CM | POA: Diagnosis not present

## 2016-07-09 ENCOUNTER — Encounter (HOSPITAL_COMMUNITY): Payer: Self-pay | Admitting: Internal Medicine

## 2016-07-09 ENCOUNTER — Telehealth (HOSPITAL_COMMUNITY): Payer: Self-pay | Admitting: Internal Medicine

## 2016-07-09 NOTE — Telephone Encounter (Signed)
Mailed out letter with Cardiac Rehab Program along with my chart message... KJ  °

## 2016-07-11 ENCOUNTER — Ambulatory Visit (INDEPENDENT_AMBULATORY_CARE_PROVIDER_SITE_OTHER): Payer: PPO | Admitting: Podiatry

## 2016-07-11 ENCOUNTER — Encounter: Payer: Self-pay | Admitting: Podiatry

## 2016-07-11 VITALS — BP 134/111 | HR 73 | Ht 68.0 in | Wt 185.0 lb

## 2016-07-11 DIAGNOSIS — E119 Type 2 diabetes mellitus without complications: Secondary | ICD-10-CM

## 2016-07-11 DIAGNOSIS — B351 Tinea unguium: Secondary | ICD-10-CM | POA: Diagnosis not present

## 2016-07-11 DIAGNOSIS — M79676 Pain in unspecified toe(s): Secondary | ICD-10-CM | POA: Diagnosis not present

## 2016-07-11 NOTE — Progress Notes (Signed)
   Subjective:    Patient ID: Dillon Young, male    DOB: June 11, 1932, 83 y.o.   MRN: [id number]  HPI This patient presents to the office with chief complaint of long painful toenails on both feet. He says that his toenails on his great toes are especially painful. He gives a history of having nail surgery for years ago to his left big toe states he experiences pain nail walking and wearing his shoes. She presents the office today for an evaluation and treatment of this condition. Patient is diabetic with no complications noted   Review of Systems     Objective:   Physical Exam GENERAL APPEARANCE: Alert, conversant. Appropriately groomed. No acute distress.  VASCULAR: Pedal pulses are  palpable at  [facility] and PT bilateral.  Capillary refill time is immediate to all digits,  Normal temperature gradient.   NEUROLOGIC: sensation is normal to 5.07 monofilament at 5/5 sites bilateral.  Light touch is intact bilateral, Muscle strength normal.  MUSCULOSKELETAL: acceptable muscle strength, tone and stability bilateral.  Intrinsic muscluature intact bilateral. DJD  1st MPJ  B/L  DERMATOLOGIC: skin color, texture, and turgor are within normal limits.  No preulcerative lesions or ulcers  are seen, no interdigital maceration noted.  No open lesions present.  No drainage noted.  NAILS  Thick disfigured discolored nails both feet.        Assessment & Plan:  Onychomycosis  B/L  IE  Debride nails  RTC 3 months   Gardiner Barefoot DPM

## 2016-07-17 ENCOUNTER — Telehealth (HOSPITAL_COMMUNITY): Payer: Self-pay | Admitting: Internal Medicine

## 2016-07-17 NOTE — Telephone Encounter (Signed)
S/w Mickel Baas from [facility] verifying benefits,  No Deductible, no Co-Insurance, $15 Co-pay, no limits per classes [place] of [place] $3400.00, pt has met $73.88, pt's responsibility is $3326.12 Reference # is [id number].  Sent msg back to pt through my chart verifying insurance information. KJ

## 2016-07-19 ENCOUNTER — Telehealth (HOSPITAL_COMMUNITY): Payer: Self-pay | Admitting: *Deleted

## 2016-07-19 NOTE — Telephone Encounter (Signed)
Received notification that pt was interested in participating in cardiac rehab.  Called and left message for pt to please return call.  Contact information provided.  Await return call. Cherre Huger, BSN

## 2016-07-20 NOTE — Telephone Encounter (Signed)
Pt returned 02/22 phone call from our office states he is no longer interested in our program and please do not call concerning this program at this time per patient.... KJ

## 2016-07-27 ENCOUNTER — Encounter: Payer: Self-pay | Admitting: Physician Assistant

## 2016-07-27 ENCOUNTER — Ambulatory Visit (INDEPENDENT_AMBULATORY_CARE_PROVIDER_SITE_OTHER): Payer: PPO | Admitting: Physician Assistant

## 2016-07-27 VITALS — BP 142/61 | HR 76 | Ht 68.0 in | Wt 184.0 lb

## 2016-07-27 DIAGNOSIS — I2510 Atherosclerotic heart disease of native coronary artery without angina pectoris: Secondary | ICD-10-CM

## 2016-07-27 DIAGNOSIS — Z79899 Other long term (current) drug therapy: Secondary | ICD-10-CM | POA: Diagnosis not present

## 2016-07-27 DIAGNOSIS — E785 Hyperlipidemia, unspecified: Secondary | ICD-10-CM | POA: Diagnosis not present

## 2016-07-27 DIAGNOSIS — E784 Other hyperlipidemia: Secondary | ICD-10-CM | POA: Diagnosis not present

## 2016-07-27 DIAGNOSIS — I6523 Occlusion and stenosis of bilateral carotid arteries: Secondary | ICD-10-CM | POA: Diagnosis not present

## 2016-07-27 DIAGNOSIS — I10 Essential (primary) hypertension: Secondary | ICD-10-CM

## 2016-07-27 LAB — LIPID PANEL
CHOL/HDL RATIO: 2.8 ratio (ref <5.0)
Cholesterol: 119 mg/dL (ref <200)
HDL: 42 mg/dL (ref >40)
LDL CALC: 52 mg/dL (ref <100)
TRIGLYCERIDES: 126 mg/dL (ref <150)
VLDL: 25 mg/dL (ref <30)

## 2016-07-27 LAB — HEPATIC FUNCTION PANEL
ALBUMIN: 4.4 g/dL (ref 3.6–5.1)
ALK PHOS: 56 U/L (ref 40–115)
ALT: 11 U/L (ref 9–46)
AST: 16 U/L (ref 10–35)
BILIRUBIN TOTAL: 0.4 mg/dL (ref 0.2–1.2)
Bilirubin, Direct: 0.1 mg/dL (ref <=0.2)
Indirect Bilirubin: 0.3 mg/dL (ref 0.2–1.2)
TOTAL PROTEIN: 7.2 g/dL (ref 6.1–8.1)

## 2016-07-27 MED ORDER — IRBESARTAN-HYDROCHLOROTHIAZIDE 300-12.5 MG PO TABS: 1.0000 | ORAL_TABLET | Freq: Every day | ORAL | 1 refills | Status: DC

## 2016-07-27 NOTE — Progress Notes (Signed)
Cardiology Office Note   Date:  07/27/2016   ID:  Dillon Young, DOB [birth year], MRN [id number]  PCP:  Dillon Calico, MD  Cardiologist:  Dr. Gwenlyn Young 04/03/2016  Dillon Ferries, PA-C    History of Present Illness: Dillon Young is a 83 y.o. male with a history of PTCA CFX [place], 12/2015 cath w/ 3.5 x 20 mm Synergy DES RCA w/ 90% RI (small) & 70% mLAD w/ med rx, HTN, HLD, R-CEA 2013, Dopplers 05/2016 w/ 1-39% R-ICA, 40-59% L-ICA & R-ECA 100%, freq PVCs  Dillon Young presents for cardiology follow up  He has been struggling with MS pain in the last 6 months. The pain has been in various places, his legs, arms and R shoulder. He saw an orthopedist and has started exercising. Symptoms have gradually started to improve.   As he has increased his activity, he has not had chest pain or SOB. No ischemic symptoms, does not feel limited in his activities. No LE edema, no orthopnea or PND.   He has nocturia 4 x night. When he gets up, he goes just a little. He is not sleeping as much as he needs because of that. He is concerned about this, wonders if his cardiac meds are affecting this.  He has not been on a BB because of his history of bigeminy causing low effective heart rates.   He has been following his BP, notes that it is high in the morning, but improves after taking rx. However, most of his BPs are >130 and many are > 140.   Past Medical History:  Diagnosis Date  . Adenomatous colon polyp   . Allergic rhinitis   . BPH (benign prostatic hyperplasia)   . CAD (coronary artery disease)    sees Dr. Quay Young   . Carotid artery disease ([place])   . CHF (congestive heart failure) ([place])   . Chronic kidney disease    [place]. Prostate cancer  . Clotting disorder ([place])   . CVA (cerebral infarction)   . Diabetes mellitus    "Borderline"  . Diverticulitis   . GERD (gastroesophageal reflux disease)    history  . Hemorrhoids   . HTN (hypertension)   . Hyperlipidemia LDL  goal <70   . IBS (irritable bowel syndrome)   . Low back pain   . Prostate cancer ([place]) 2008   seed implantation  . Sleep apnea    last study was aborted.  Has never worn a CPAP.    Marland Kitchen Smoker     Past Surgical History:  Procedure Laterality Date  . APPENDECTOMY    . CARDIAC CATHETERIZATION N/A 01/16/2016   Procedure: Left Heart Cath and Coronary Angiography;  Surgeon: Jettie Booze, MD;  Location: [place] [facility];  Service: Cardiovascular;  Laterality: N/A;  . CARDIAC CATHETERIZATION N/A 01/16/2016   Procedure: Coronary Stent Intervention;  Surgeon: Jettie Booze, MD;  Location: [place] [facility];  Service: Cardiovascular;  Laterality: N/A;  . Cataract with Lens     Bilateral  . COLONOSCOPY  10/21/2008   diverticulosis, radiation proctitis, internal hemorrhoids  . ENDARTERECTOMY  11/12/2011   Procedure: ENDARTERECTOMY CAROTID;  Surgeon: Rosetta Posner, MD;  Location: [facility] OR;  Service: Vascular;  Laterality: Right;  . Esophageal dilitation    . HEMORRHOID BANDING    . Lithrotripsy      x 3  . LUMBAR LAMINECTOMY    . TRANSURETHRAL RESECTION OF PROSTATE  10/2003  Medication Sig  . acetaminophen (TYLENOL) 325 MG tablet Take 2 tablets (650 mg total) by mouth every 4 (four) hours as needed for headache or mild pain.  Marland Kitchen amLODipine (NORVASC) 10 MG tablet Take 1 tablet (10 mg total) by mouth daily.  Marland Kitchen aspirin EC 81 MG tablet Take 81 mg by mouth every evening.   . cholecalciferol (VITAMIN D) 1000 UNITS tablet Take 1,000 Units by mouth daily.    . clopidogrel (PLAVIX) 75 MG tablet Take 1 tablet (75 mg total) by mouth daily with breakfast.  . diclofenac sodium (VOLTAREN) 1 % GEL Apply 2 g topically 4 (four) times daily.  Marland Kitchen dicyclomine (BENTYL) 20 MG tablet TAKE 1 TABLET (20 MG TOTAL) BY MOUTH EVERY 6 (SIX) HOURS AS NEEDED FOR SPASMS.  . Doxepin HCl (SILENOR) 3 MG TABS Take 1 tablet (3 mg total) by mouth at bedtime as needed (sleep).  . fluticasone (FLONASE) 50 MCG/ACT nasal spray  Place 1-2 sprays into both nostrils daily as needed for allergies.  . hydrocortisone (ANUSOL-HC) 2.5 % rectal cream Place 1 application rectally 2 (two) times daily as needed for hemorrhoids.   . irbesartan-hydrochlorothiazide (AVALIDE) 300-12.5 MG tablet Take 1 tablet by mouth daily.  . Melatonin 5 MG CAPS Take 1 capsule (5 mg total) by mouth daily as needed (sleep).  . nitroGLYCERIN (NITROSTAT) 0.4 MG SL tablet Place 1 tablet (0.4 mg total) under the tongue every 5 (five) minutes as needed for chest pain.  . polyethylene glycol powder (GLYCOLAX/MIRALAX) powder Take 17 g by mouth as needed. (Patient taking differently: Take 17 g by mouth daily as needed for mild constipation. )  . rosuvastatin (CRESTOR) 10 MG tablet Take 1 tablet (10 mg total) by mouth daily.  . vitamin C (ASCORBIC ACID) 500 MG tablet Take 500 mg by mouth daily.   . Wheat Dextrin (BENEFIBER) POWD 2 tablespoons a day (Patient taking differently: Take 10 mLs by mouth daily. )   No current facility-administered medications for this visit.     Allergies:   Lipitor [atorvastatin]    Social History:  The patient  reports that he quit smoking about 8 years ago. His smoking use included Cigarettes. He has a 90.00 pack-year smoking history. He has never used smokeless tobacco. He reports that he drinks alcohol. He reports that he does not use drugs.   Family History:  The patient's family history includes Heart disease in his father.    ROS:  Please see the history of present illness. All other systems are reviewed and negative.    PHYSICAL EXAM: VS:  BP (!) 142/61 (BP Location: Left Arm, Patient Position: Sitting, Cuff Size: Normal)   Pulse 76   Ht 5\' 8"  (1.727 m)   Wt 184 lb (83.5 kg)   BMI 27.98 kg/m  , BMI Body mass index is 27.98 kg/m. GEN: Well nourished, well developed, male in no acute distress  HEENT: normal for age  Neck: no JVD, L>R carotid bruit, no masses Cardiac: RRR; soft murmur, no rubs, or  gallops Respiratory:  clear to auscultation bilaterally, normal work of breathing GI: soft, nontender, nondistended, + BS MS: no deformity or atrophy; no edema; distal pulses are 2+ in all 4 extremities   Skin: warm and dry, no rash Neuro:  Strength and sensation are intact Psych: euthymic mood, full affect   EKG:  EKG is ordered today. The ekg ordered today demonstrates SR, 1st degree AVB, freq PVCs   Recent Labs: 12/15/2015: TSH 1.63 02/13/2016: ALT 13 04/11/2016:  BUN 22; Creatinine, Ser 1.12; Potassium 4.6; Sodium 141 05/03/2016: Hemoglobin 12.7; Platelets 240.0    Lipid Panel    Component Value Date/Time   CHOL 122 (L) 02/13/2016 0826   TRIG 144 02/13/2016 0826   HDL 46 02/13/2016 0826   CHOLHDL 2.7 02/13/2016 0826   VLDL 29 02/13/2016 0826   LDLCALC 47 02/13/2016 0826     Wt Readings from Last 3 Encounters:  07/27/16 184 lb (83.5 kg)  07/11/16 185 lb (83.9 kg)  05/09/16 185 lb (83.9 kg)     Other studies Reviewed: Additional studies/ records that were reviewed today include: Office notes, hospital records and testing.  ASSESSMENT AND PLAN:  1.  CAD: He is not having any anginal symptoms. He is on good medical therapy with aspirin, Plavix, ARB and statin. He has not needed additional medical therapy for the small 90% ramus intermedius or 70% mid LAD. Continue to increase activity as tolerated.  2. Hyperlipidemia: He is due for another lipid profile today. His last LDL was 47. He wonders why he needs to be on a statin if his LDL is low. I advised that we could not do anything about his genetics, but to lower his LDL is the better off he is. He is encouraged to continue statin therapy and a low-cholesterol diet.  3. Hypertension: We cannot add a beta blocker because of the PVCs. He is on the maximum dose of amlodipine and close to the max dose of Avalide. We could increase the Avalide by having him take 150/12.5 mg twice a day for total of 300/25 mg. However, he has had  some mild renal insufficiency in the past and he has significant nocturia. His blood pressure is higher in the evening, consider changing the amlodipine to evening administration to see if that helps even out his readings.  His systolic blood pressure is generally above 130 but generally less than 150. Dr. Gwenlyn Young to review and advise if this warrants adding a third drug.  4. Musculoskeletal pain: This is significant for him. I explained that because of the aspirin and Plavix, it is not a good idea to take high doses of NSAIDs. He and occasional single dose is okay. If his physician wishes to prescribe something besides Tylenol for pain, there is no cardiac reason not to.  5. Cerebrovascular disease: He needs to make sure to keep appointments for his follow-up carotid Dopplers.  6. Nocturia: He should follow-up with his family physician on this. I explained that with his history of prostate cancer this might be difficult to fix.  Current medicines are reviewed at length with the patient today.  The patient does not have concerns regarding medicines.  The following changes have been made:  no change  Labs/ tests ordered today include:   Orders Placed This Encounter  Procedures  . EKG 12-Lead     Disposition:   FU with Dr. Gwenlyn Young  Signed, Dillon Ferries, PA-C  07/27/2016 11:13 AM    [place] Phone: [phone]; Fax: [phone]  This note was written with the assistance of speech recognition software. Please excuse any transcriptional errors.

## 2016-07-27 NOTE — Patient Instructions (Signed)
Medication Instructions:  CONTINUE  Irbesartan-hctz ---it does not come in a stronger dose AVOID taking NSAIDS such as Ibuprofen--OK to TAKE SPARINGLY It is OK to take your Norvasc (Amlopidine) at bedtime for better blood pressure control   Suanne Marker will discuss with Dr [name] about other possibilities for blood pressure meds   Labwork: None   Testing/Procedures: None   Follow up: Your physician wants you to follow-up in: SEPTEMBER 2018 WITH DR BERRY AS SCHEDULED. You will receive a reminder letter in the mail two months in advance. If you don't receive a letter, please call our office to schedule the follow-up appointment.  Any Other Special Instructions Will Be Listed Below (If Applicable).  If you need a refill on your cardiac medications before your next appointment, please call your pharmacy.

## 2016-07-30 ENCOUNTER — Telehealth: Payer: Self-pay | Admitting: Cardiovascular Disease

## 2016-07-30 NOTE — Telephone Encounter (Signed)
Returned call, discussed metoprolol. He had old bottle and wanted to make sure he understood correctly that he did not need to take this. (He took metoprolol in the past, this is a discontinued med). Per review of notes, pt instructed to avoid BB's due to hx of bigeminy and low baseline HR. He voiced understanding to avoid metoprolol and discard bottle. Additionally, we discussed his switch of amlodipine from morning to evening. He wanted to inform that this switch seems to be working for his BP control. Pt voiced thanks for call and discussion. He's aware to call if new concerns.

## 2016-07-30 NOTE — Telephone Encounter (Signed)
New Message  Pt voiced he is wanting to speak to nurse regard to a medication not listed on med list, metoprolol.  Please f/u

## 2016-07-30 NOTE — Telephone Encounter (Signed)
-----   Message from [name], MD sent at 07/27/2016  1:19 PM EST ----- Excellent FLP and LFTs

## 2016-07-31 ENCOUNTER — Ambulatory Visit: Payer: PPO | Admitting: Cardiovascular Disease

## 2016-08-01 ENCOUNTER — Ambulatory Visit (INDEPENDENT_AMBULATORY_CARE_PROVIDER_SITE_OTHER): Payer: PPO | Admitting: Internal Medicine

## 2016-08-01 ENCOUNTER — Encounter: Payer: Self-pay | Admitting: Internal Medicine

## 2016-08-01 VITALS — BP 140/58 | HR 64 | Temp 97.7°F | Resp 16 | Ht 68.0 in | Wt 189.0 lb

## 2016-08-01 DIAGNOSIS — J0100 Acute maxillary sinusitis, unspecified: Secondary | ICD-10-CM

## 2016-08-01 DIAGNOSIS — J301 Allergic rhinitis due to pollen: Secondary | ICD-10-CM

## 2016-08-01 MED ORDER — CEFDINIR 300 MG PO CAPS: 300.0000 mg | ORAL_CAPSULE | Freq: Two times a day (BID) | ORAL | 0 refills | Status: DC

## 2016-08-01 MED ORDER — AZELASTINE HCL 0.1 % NA SOLN: 1.0000 | Freq: Two times a day (BID) | NASAL | 11 refills | Status: DC

## 2016-08-01 MED ORDER — FLUTICASONE PROPIONATE 50 MCG/ACT NA SUSP: 1.0000 | Freq: Every day | NASAL | 1 refills | Status: DC | PRN

## 2016-08-01 MED ORDER — METHYLPREDNISOLONE 4 MG PO TBPK: ORAL_TABLET | ORAL | 0 refills | Status: DC

## 2016-08-01 NOTE — Progress Notes (Signed)
Pre visit review using our clinic review tool, if applicable. No additional management support is needed unless otherwise documented below in the visit note. 

## 2016-08-01 NOTE — Progress Notes (Signed)
Subjective:  Patient ID: Dillon Young, male    DOB: 12-Jun-1932  Age: 83 y.o. MRN: [id number]  CC: Allergic Rhinitis  and Sinusitis   HPI Dillon Young presents for A 3 week history of postnasal drip, runny nose, nasal congestion, sinus pain, and sneezing.  Outpatient Medications Prior to Visit  Medication Sig Dispense Refill  . acetaminophen (TYLENOL) 325 MG tablet Take 2 tablets (650 mg total) by mouth every 4 (four) hours as needed for headache or mild pain.    Marland Kitchen amLODipine (NORVASC) 10 MG tablet Take 1 tablet (10 mg total) by mouth daily. 90 tablet 3  . aspirin EC 81 MG tablet Take 81 mg by mouth every evening.     . cholecalciferol (VITAMIN D) 1000 UNITS tablet Take 1,000 Units by mouth daily.      . clopidogrel (PLAVIX) 75 MG tablet Take 1 tablet (75 mg total) by mouth daily with breakfast. 90 tablet 3  . diclofenac sodium (VOLTAREN) 1 % GEL Apply 2 g topically 4 (four) times daily. 100 g 11  . dicyclomine (BENTYL) 20 MG tablet TAKE 1 TABLET (20 MG TOTAL) BY MOUTH EVERY 6 (SIX) HOURS AS NEEDED FOR SPASMS. 30 tablet 2  . Doxepin HCl (SILENOR) 3 MG TABS Take 1 tablet (3 mg total) by mouth at bedtime as needed (sleep).    . hydrocortisone (ANUSOL-HC) 2.5 % rectal cream Place 1 application rectally 2 (two) times daily as needed for hemorrhoids.     . irbesartan-hydrochlorothiazide (AVALIDE) 300-12.5 MG tablet Take 1 tablet by mouth daily. 90 tablet 1  . Melatonin 5 MG CAPS Take 1 capsule (5 mg total) by mouth daily as needed (sleep).    . polyethylene glycol powder (GLYCOLAX/MIRALAX) powder Take 17 g by mouth as needed. (Patient taking differently: Take 17 g by mouth daily as needed for mild constipation. ) 255 g 3  . rosuvastatin (CRESTOR) 10 MG tablet Take 1 tablet (10 mg total) by mouth daily. 30 tablet 6  . vitamin C (ASCORBIC ACID) 500 MG tablet Take 500 mg by mouth daily.     . fluticasone (FLONASE) 50 MCG/ACT nasal spray Place 1-2 sprays into both nostrils daily as needed  for allergies. 48 g 1  . Wheat Dextrin (BENEFIBER) POWD 2 tablespoons a day (Patient taking differently: Take 10 mLs by mouth daily. )    . nitroGLYCERIN (NITROSTAT) 0.4 MG SL tablet Place 1 tablet (0.4 mg total) under the tongue every 5 (five) minutes as needed for chest pain. (Patient not taking: Reported on 08/01/2016) 25 tablet 3   No facility-administered medications prior to visit.     ROS Review of Systems  Constitutional: Negative for activity change, chills, fatigue and fever.  HENT: Positive for congestion, postnasal drip, rhinorrhea and sinus pain. Negative for ear pain, facial swelling, nosebleeds, sinus pressure, sneezing, sore throat, tinnitus, trouble swallowing and voice change.   Eyes: Negative.   Respiratory: Negative.  Negative for cough, chest tightness, shortness of breath and stridor.   Cardiovascular: Negative for chest pain, palpitations and leg swelling.  Gastrointestinal: Negative for abdominal pain, constipation, diarrhea, nausea and vomiting.  Endocrine: Negative.   Genitourinary: Negative.  Negative for difficulty urinating.  Musculoskeletal: Negative.  Negative for back pain and neck pain.  Skin: Negative.   Allergic/Immunologic: Negative.   Neurological: Negative.   Hematological: Negative for adenopathy. Does not bruise/bleed easily.  Psychiatric/Behavioral: Negative.     Objective:  BP (!) 140/58 (BP Location: Left Arm, Patient Position: Sitting, Cuff  Size: Normal)   Pulse 64   Temp 97.7 F (36.5 C) (Oral)   Resp 16   Ht 5\' 8"  (1.727 m)   Wt 189 lb (85.7 kg)   SpO2 94%   BMI 28.74 kg/m   BP Readings from Last 3 Encounters:  08/01/16 (!) 140/58  07/27/16 (!) 142/61  07/11/16 (!) 134/111    Wt Readings from Last 3 Encounters:  08/01/16 189 lb (85.7 kg)  07/27/16 184 lb (83.5 kg)  07/11/16 185 lb (83.9 kg)    Physical Exam  Constitutional: He is oriented to person, place, and time. No distress.  HENT:  Right Ear: Hearing, tympanic  membrane, external ear and ear canal normal.  Left Ear: Hearing, external ear and ear canal normal.  Nose: Mucosal edema and rhinorrhea present. No sinus tenderness or nasal septal hematoma. No epistaxis. Right sinus exhibits maxillary sinus tenderness. Right sinus exhibits no frontal sinus tenderness. Left sinus exhibits maxillary sinus tenderness. Left sinus exhibits no frontal sinus tenderness.  Mouth/Throat: Oropharynx is clear and moist and mucous membranes are normal. Mucous membranes are not pale, not dry and not cyanotic. No oropharyngeal exudate, posterior oropharyngeal edema, posterior oropharyngeal erythema or tonsillar abscesses.  Eyes: Conjunctivae are normal. Right eye exhibits no discharge. Left eye exhibits no discharge. No scleral icterus.  Neck: Normal range of motion. Neck supple. No JVD present. No tracheal deviation present. No thyromegaly present.  Cardiovascular: Normal rate, regular rhythm, normal heart sounds and intact distal pulses.  Exam reveals no gallop and no friction rub.   No murmur heard. Pulmonary/Chest: Effort normal and breath sounds normal. No respiratory distress. He has no wheezes. He has no rales. He exhibits no tenderness.  Abdominal: Soft. Bowel sounds are normal. He exhibits no distension and no mass. There is no tenderness. There is no rebound and no guarding.  Musculoskeletal: Normal range of motion. He exhibits no edema, tenderness or deformity.  Lymphadenopathy:    He has no cervical adenopathy.  Neurological: He is oriented to person, place, and time.  Skin: Skin is warm and dry. No rash noted. He is not diaphoretic. No erythema. No pallor.  Vitals reviewed.   Lab Results  Component Value Date   WBC 5.6 05/03/2016   HGB 12.7 (L) 05/03/2016   HCT 37.6 (L) 05/03/2016   PLT 240.0 05/03/2016   GLUCOSE 145 (H) 04/11/2016   CHOL 119 07/27/2016   TRIG 126 07/27/2016   HDL 42 07/27/2016   LDLCALC 52 07/27/2016   ALT 11 07/27/2016   AST 16  07/27/2016   NA 141 04/11/2016   K 4.6 04/11/2016   CL 103 04/11/2016   CREATININE 1.12 04/11/2016   BUN 22 04/11/2016   CO2 30 04/11/2016   TSH 1.63 12/15/2015   PSA 0.01 (L) 07/05/2014   INR 1.06 01/15/2016   HGBA1C 6.6 (H) 04/11/2016   MICROALBUR 6.1 (H) 08/15/2015    No results found.  Assessment & Plan:   Dillon Young was seen today for allergic rhinitis  and sinusitis.  Diagnoses and all orders for this visit:  Acute nonseasonal allergic rhinitis due to pollen -     fluticasone (FLONASE) 50 MCG/ACT nasal spray; Place 1-2 sprays into both nostrils daily as needed for allergies. -     methylPREDNISolone (MEDROL DOSEPAK) 4 MG TBPK tablet; TAKE AS DIRECTED -     azelastine (ASTELIN) 0.1 % nasal spray; Place 1 spray into both nostrils 2 (two) times daily. Use in each nostril as directed  Acute maxillary  sinusitis, recurrence not specified -     cefdinir (OMNICEF) 300 MG capsule; Take 1 capsule (300 mg total) by mouth 2 (two) times daily.   I have discontinued Dillon Young's BENEFIBER. I am also having him start on cefdinir, methylPREDNISolone, and azelastine. Additionally, I am having him maintain his vitamin C, cholecalciferol, aspirin EC, polyethylene glycol powder, hydrocortisone, nitroGLYCERIN, acetaminophen, amLODipine, clopidogrel, Doxepin HCl, Melatonin, dicyclomine, diclofenac sodium, rosuvastatin, irbesartan-hydrochlorothiazide, and fluticasone.  Meds ordered this encounter  Medications  . fluticasone (FLONASE) 50 MCG/ACT nasal spray    Sig: Place 1-2 sprays into both nostrils daily as needed for allergies.    Dispense:  48 g    Refill:  1    Dispense 90 day supply  . cefdinir (OMNICEF) 300 MG capsule    Sig: Take 1 capsule (300 mg total) by mouth 2 (two) times daily.    Dispense:  20 capsule    Refill:  0  . methylPREDNISolone (MEDROL DOSEPAK) 4 MG TBPK tablet    Sig: TAKE AS DIRECTED    Dispense:  21 tablet    Refill:  0  . azelastine (ASTELIN) 0.1 % nasal spray      Sig: Place 1 spray into both nostrils 2 (two) times daily. Use in each nostril as directed    Dispense:  30 mL    Refill:  11     Follow-up: No Follow-up on file.  [name], MD

## 2016-08-01 NOTE — Patient Instructions (Signed)

## 2016-09-04 DIAGNOSIS — Z8546 Personal history of malignant neoplasm of prostate: Secondary | ICD-10-CM | POA: Diagnosis not present

## 2016-09-11 ENCOUNTER — Other Ambulatory Visit: Payer: Self-pay | Admitting: Cardiovascular Disease

## 2016-09-13 DIAGNOSIS — R351 Nocturia: Secondary | ICD-10-CM | POA: Diagnosis not present

## 2016-09-13 DIAGNOSIS — C61 Malignant neoplasm of prostate: Secondary | ICD-10-CM | POA: Diagnosis not present

## 2016-09-18 ENCOUNTER — Other Ambulatory Visit: Payer: Self-pay | Admitting: Cardiovascular Disease

## 2016-10-10 ENCOUNTER — Ambulatory Visit: Payer: PPO | Admitting: Podiatry

## 2016-11-13 ENCOUNTER — Other Ambulatory Visit: Payer: Self-pay | Admitting: Cardiovascular Disease

## 2016-11-14 ENCOUNTER — Other Ambulatory Visit: Payer: Self-pay | Admitting: *Deleted

## 2016-11-14 MED ORDER — ROSUVASTATIN CALCIUM 10 MG PO TABS: 10.0000 mg | ORAL_TABLET | Freq: Every day | ORAL | 1 refills | Status: DC

## 2017-01-02 ENCOUNTER — Other Ambulatory Visit: Payer: Self-pay | Admitting: Cardiovascular Disease

## 2017-01-15 ENCOUNTER — Telehealth: Payer: Self-pay | Admitting: Internal Medicine

## 2017-01-15 NOTE — Telephone Encounter (Signed)
Patient is having pain with hemorrhoids.  He will come in on 01/21/17 3:15 for eval with Dr. Carlean Purl

## 2017-01-17 ENCOUNTER — Encounter: Payer: Self-pay | Admitting: Internal Medicine

## 2017-01-17 ENCOUNTER — Ambulatory Visit (INDEPENDENT_AMBULATORY_CARE_PROVIDER_SITE_OTHER): Payer: PPO | Admitting: Internal Medicine

## 2017-01-17 VITALS — BP 120/68 | HR 64 | Temp 98.1°F | Resp 16 | Ht 68.0 in | Wt 184.8 lb

## 2017-01-17 DIAGNOSIS — J301 Allergic rhinitis due to pollen: Secondary | ICD-10-CM

## 2017-01-17 MED ORDER — METHYLPREDNISOLONE 4 MG PO TBPK: ORAL_TABLET | ORAL | 0 refills | Status: DC

## 2017-01-17 NOTE — Progress Notes (Signed)
Subjective:  Patient ID: Dillon Young, male    DOB: March 25, 1933  Age: 83 y.o. MRN: [id number]  CC: URI and Allergic Rhinitis    HPI Dillon Young presents for a 2 week history of sore throat, postnasal drip, nasal congestion, and runny nose. He had a leftover prescription of Cefdinir laying around the house and has been taking it but says it has not helped with the symptoms. He has also been using a saline nasal spray and Flonase nasal spray without much improvement in his symptoms.  Outpatient Medications Prior to Visit  Medication Sig Dispense Refill  . amLODipine (NORVASC) 10 MG tablet TAKE 1 TABLET BY MOUTH EVERY DAY 90 tablet 0  . aspirin EC 81 MG tablet Take 81 mg by mouth every evening.     Marland Kitchen azelastine (ASTELIN) 0.1 % nasal spray Place 1 spray into both nostrils 2 (two) times daily. Use in each nostril as directed 30 mL 11  . cefdinir (OMNICEF) 300 MG capsule Take 1 capsule (300 mg total) by mouth 2 (two) times daily. 20 capsule 0  . cholecalciferol (VITAMIN D) 1000 UNITS tablet Take 1,000 Units by mouth daily.      . clopidogrel (PLAVIX) 75 MG tablet TAKE 1 TABLET BY MOUTH EVERY DAY WITH BREAKFAST 90 tablet 0  . diclofenac sodium (VOLTAREN) 1 % GEL Apply 2 g topically 4 (four) times daily. 100 g 11  . dicyclomine (BENTYL) 20 MG tablet TAKE 1 TABLET (20 MG TOTAL) BY MOUTH EVERY 6 (SIX) HOURS AS NEEDED FOR SPASMS. 30 tablet 2  . Doxepin HCl (SILENOR) 3 MG TABS Take 1 tablet (3 mg total) by mouth at bedtime as needed (sleep).    . fluticasone (FLONASE) 50 MCG/ACT nasal spray Place 1-2 sprays into both nostrils daily as needed for allergies. 48 g 1  . hydrocortisone (ANUSOL-HC) 2.5 % rectal cream Place 1 application rectally 2 (two) times daily as needed for hemorrhoids.     . irbesartan-hydrochlorothiazide (AVALIDE) 300-12.5 MG tablet TAKE 1 TABLET BY MOUTH EVERY DAY 90 tablet 1  . Melatonin 5 MG CAPS Take 1 capsule (5 mg total) by mouth daily as needed (sleep).    .  nitroGLYCERIN (NITROSTAT) 0.4 MG SL tablet Place 1 tablet (0.4 mg total) under the tongue every 5 (five) minutes as needed for chest pain. 25 tablet 3  . polyethylene glycol powder (GLYCOLAX/MIRALAX) powder Take 17 g by mouth as needed. (Patient taking differently: Take 17 g by mouth daily as needed for mild constipation. ) 255 g 3  . rosuvastatin (CRESTOR) 10 MG tablet Take 1 tablet (10 mg total) by mouth daily. 90 tablet 1  . vitamin C (ASCORBIC ACID) 500 MG tablet Take 500 mg by mouth daily.     Marland Kitchen acetaminophen (TYLENOL) 325 MG tablet Take 2 tablets (650 mg total) by mouth every 4 (four) hours as needed for headache or mild pain.    Marland Kitchen irbesartan-hydrochlorothiazide (AVALIDE) 300-12.5 MG tablet Take 1 tablet by mouth daily. 90 tablet 1  . methylPREDNISolone (MEDROL DOSEPAK) 4 MG TBPK tablet TAKE AS DIRECTED 21 tablet 0   No facility-administered medications prior to visit.     ROS Review of Systems  Constitutional: Negative for chills, fatigue, fever and unexpected weight change.  HENT: Positive for congestion, postnasal drip, rhinorrhea and sore throat. Negative for facial swelling, sinus pain, sinus pressure and trouble swallowing.   Eyes: Negative.   Respiratory: Negative.  Negative for cough, chest tightness, shortness of breath and wheezing.  Cardiovascular: Negative for chest pain, palpitations and leg swelling.  Gastrointestinal: Negative for abdominal pain, constipation, diarrhea, nausea and vomiting.  Endocrine: Negative.   Genitourinary: Negative.   Musculoskeletal: Negative.   Skin: Negative.   Allergic/Immunologic: Negative.   Neurological: Negative.  Negative for dizziness, weakness and headaches.  Hematological: Negative for adenopathy. Does not bruise/bleed easily.  Psychiatric/Behavioral: Negative.     Objective:  BP 120/68 (BP Location: Left Arm, Patient Position: Sitting, Cuff Size: Normal)   Pulse 64   Temp 98.1 F (36.7 C) (Oral)   Resp 16   Ht 5\' 8"  (1.727  m)   Wt 184 lb 12.8 oz (83.8 kg)   SpO2 98%   BMI 28.10 kg/m   BP Readings from Last 3 Encounters:  01/17/17 120/68  08/01/16 (!) 140/58  07/27/16 (!) 142/61    Wt Readings from Last 3 Encounters:  01/17/17 184 lb 12.8 oz (83.8 kg)  08/01/16 189 lb (85.7 kg)  07/27/16 184 lb (83.5 kg)    Physical Exam  Constitutional: He is oriented to person, place, and time. No distress.  HENT:  Right Ear: Hearing, tympanic membrane, external ear and ear canal normal.  Left Ear: Hearing, tympanic membrane, external ear and ear canal normal.  Nose: Mucosal edema and rhinorrhea present. No sinus tenderness. No epistaxis. Right sinus exhibits no maxillary sinus tenderness and no frontal sinus tenderness. Left sinus exhibits no maxillary sinus tenderness and no frontal sinus tenderness.  Mouth/Throat: Oropharynx is clear and moist and mucous membranes are normal. Mucous membranes are not pale, not dry and not cyanotic. No oral lesions. No trismus in the jaw. No uvula swelling. No oropharyngeal exudate, posterior oropharyngeal edema, posterior oropharyngeal erythema or tonsillar abscesses.  Eyes: Conjunctivae are normal. Right eye exhibits no discharge. Left eye exhibits no discharge. No scleral icterus.  Neck: Normal range of motion. Neck supple. No JVD present. No thyromegaly present.  Cardiovascular: Normal rate, regular rhythm and intact distal pulses.  Exam reveals no gallop and no friction rub.   No murmur heard. Pulmonary/Chest: Effort normal and breath sounds normal. No respiratory distress. He has no wheezes. He has no rales. He exhibits no tenderness.  Abdominal: Soft. Bowel sounds are normal. He exhibits no distension and no mass. There is no tenderness. There is no rebound and no guarding.  Musculoskeletal: Normal range of motion. He exhibits no edema, tenderness or deformity.  Lymphadenopathy:    He has no cervical adenopathy.  Neurological: He is alert and oriented to person, place, and  time.  Skin: Skin is warm and dry. No rash noted. He is not diaphoretic. No erythema. No pallor.  Vitals reviewed.   Lab Results  Component Value Date   WBC 5.6 05/03/2016   HGB 12.7 (L) 05/03/2016   HCT 37.6 (L) 05/03/2016   PLT 240.0 05/03/2016   GLUCOSE 145 (H) 04/11/2016   CHOL 119 07/27/2016   TRIG 126 07/27/2016   HDL 42 07/27/2016   LDLCALC 52 07/27/2016   ALT 11 07/27/2016   AST 16 07/27/2016   NA 141 04/11/2016   K 4.6 04/11/2016   CL 103 04/11/2016   CREATININE 1.12 04/11/2016   BUN 22 04/11/2016   CO2 30 04/11/2016   TSH 1.63 12/15/2015   PSA 0.01 (L) 07/05/2014   INR 1.06 01/15/2016   HGBA1C 6.6 (H) 04/11/2016   MICROALBUR 6.1 (H) 08/15/2015    No results found.  Assessment & Plan:   Michiel was seen today for uri and allergic rhinitis .  Diagnoses and all orders for this visit:  Seasonal allergic rhinitis due to pollen- he is having a severe flare that has not responded to steroid nasal sprays. I don't see any evidence of secondary bacterial infection at this time. Will treat with a course of systemic methylprednisolone. -     methylPREDNISolone (MEDROL DOSEPAK) 4 MG TBPK tablet; TAKE AS DIRECTED  Acute nonseasonal allergic rhinitis due to pollen   I have discontinued Mr. Raatz's acetaminophen. I am also having him maintain his vitamin C, cholecalciferol, aspirin EC, polyethylene glycol powder, hydrocortisone, nitroGLYCERIN, Doxepin HCl, Melatonin, dicyclomine, diclofenac sodium, fluticasone, cefdinir, azelastine, irbesartan-hydrochlorothiazide, rosuvastatin, clopidogrel, amLODipine, and methylPREDNISolone.  Meds ordered this encounter  Medications  . methylPREDNISolone (MEDROL DOSEPAK) 4 MG TBPK tablet    Sig: TAKE AS DIRECTED    Dispense:  21 tablet    Refill:  0     Follow-up: Return if symptoms worsen or fail to improve.  [name], MD

## 2017-01-17 NOTE — Patient Instructions (Signed)

## 2017-01-21 ENCOUNTER — Telehealth: Payer: Self-pay | Admitting: Internal Medicine

## 2017-01-21 ENCOUNTER — Encounter: Payer: PPO | Admitting: Internal Medicine

## 2017-01-21 NOTE — Telephone Encounter (Signed)
Great to hear he's better.

## 2017-02-20 ENCOUNTER — Telehealth: Payer: Self-pay | Admitting: Cardiovascular Disease

## 2017-02-20 NOTE — Telephone Encounter (Signed)
Pt c/o BP issue: STAT if pt c/o blurred vision, one-sided weakness or slurred speech  1. What are your last 5 BP readings? 98/57  119/87  94/57  111/66 2. Are you having any other symptoms (ex. Dizziness, headache, blurred vision, passed out)? No, lightheaded   3. What is your BP issue? Low up and down with and  without medication   Pt question How do I know when I should and should not take the medication

## 2017-02-20 NOTE — Telephone Encounter (Signed)
Patient states that he has been exercising more frequently and his blood pressures have been lower since he last seen you in March, 2018. He takes irbesartan-hctz in the morning and amlodipine at night. Patient has not taken any blood pressure medication in the last 24 hours, he listed multiple readings from this morning:  119/87, 109/65, 98/57, 94/57  Please advise on if patient should stop a medication or provide parameters until he sees Dr. [name] in November. Thanks.

## 2017-02-20 NOTE — Telephone Encounter (Signed)
Patient advised to stop amlodipine. Advised to restart at half tablet if starts getting chest pain or BP gets over 120. Educated okay to have SBP 100-110. Advised to call if this does not help. Patient verbalized understanding, no further questions.

## 2017-02-20 NOTE — Telephone Encounter (Signed)
Pt of Dr [name] Ask him to hold the amlodipine. If he starts getting chest pain or his BP gets over 120, ok to restart at half tab (5 mg qd) Call back if this does not help or is not enough. Ok to have SBP 100-110 if he feels ok. Thanks

## 2017-03-19 ENCOUNTER — Ambulatory Visit (INDEPENDENT_AMBULATORY_CARE_PROVIDER_SITE_OTHER): Payer: PPO | Admitting: General Practice

## 2017-03-19 DIAGNOSIS — Z23 Encounter for immunization: Secondary | ICD-10-CM | POA: Diagnosis not present

## 2017-03-21 DIAGNOSIS — J343 Hypertrophy of nasal turbinates: Secondary | ICD-10-CM | POA: Diagnosis not present

## 2017-03-21 DIAGNOSIS — J300 Vasomotor rhinitis: Secondary | ICD-10-CM | POA: Diagnosis not present

## 2017-03-21 DIAGNOSIS — J310 Chronic rhinitis: Secondary | ICD-10-CM | POA: Diagnosis not present

## 2017-03-24 ENCOUNTER — Other Ambulatory Visit: Payer: Self-pay | Admitting: Cardiovascular Disease

## 2017-03-25 NOTE — Telephone Encounter (Signed)
REFILL 

## 2017-04-03 ENCOUNTER — Other Ambulatory Visit: Payer: Self-pay | Admitting: Cardiovascular Disease

## 2017-04-03 NOTE — Telephone Encounter (Signed)
Rx has been sent to the pharmacy electronically. ° °

## 2017-04-05 ENCOUNTER — Ambulatory Visit: Payer: PPO | Admitting: Cardiovascular Disease

## 2017-04-05 ENCOUNTER — Encounter: Payer: Self-pay | Admitting: Cardiovascular Disease

## 2017-04-05 DIAGNOSIS — Z9861 Coronary angioplasty status: Secondary | ICD-10-CM | POA: Diagnosis not present

## 2017-04-05 DIAGNOSIS — I2510 Atherosclerotic heart disease of native coronary artery without angina pectoris: Secondary | ICD-10-CM

## 2017-04-05 DIAGNOSIS — I6523 Occlusion and stenosis of bilateral carotid arteries: Secondary | ICD-10-CM

## 2017-04-05 MED ORDER — IRBESARTAN-HYDROCHLOROTHIAZIDE 300-12.5 MG PO TABS: 1.0000 | ORAL_TABLET | Freq: Every day | ORAL | 1 refills | Status: DC

## 2017-04-05 MED ORDER — AMLODIPINE BESYLATE 10 MG PO TABS: 5.0000 mg | ORAL_TABLET | Freq: Every day | ORAL | 1 refills | Status: DC

## 2017-04-05 MED ORDER — CLOPIDOGREL BISULFATE 75 MG PO TABS: 75.0000 mg | ORAL_TABLET | Freq: Every day | ORAL | 1 refills | Status: DC

## 2017-04-05 NOTE — Assessment & Plan Note (Signed)
History of CAD status post multiple PCI's in the past. He had circumflex angioplasty in 1990 09/13/1993. His most recent cardiac catheterization was performed by Dr. [name] 01/16/16 revealing a 90% segmental mid-dominant RCA stenosis which was stented with a Synergy 3.5 mm x 20 mm long drug-eluting stent. He did have a small ramus branch with 90% stenosis not suitable for intervention as well as a 70% mid LAD lesion thought not to be physiologically significant. He denies chest pain or shortness of breath.

## 2017-04-05 NOTE — Progress Notes (Signed)
04/05/2017 Dillon Young   Sep 27, 1932  [id number]  Primary Physician [name], MD Primary Cardiologist: [name] MD [name], [name]  HPI:  Dillon Young is a 83 y.o. married Caucasian male father of 2 children, grandfather to 2 grandchildren he was formally a patient of Dr. [name]. 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 He works out 5 days a week and has joined [employer]. He has a history of coronary artery disease status post angioplasty in 1994 and 1995 Of his circumflex coronary artery. He has not required cardiac catheterization since stress Myoview was performed in 2011 that was low risk and nonischemic. He has a history of hypertension and hyperlipidemia on medication. He had right carotid endarterectomy performed by Dr. [name] Early June 2013 followed by duplex ultrasound. He denies chest pain or shortness of breath. His major complaint is of cramping in his thighs which may be related to his statin drug. He was given a statin holiday which really did not impact his symptoms and therefore statin was restarted and follow-up lipid profile performed 07/05/14 revealed a total cholesterol 131, LDL 58 and HDL of 44. Recent carotid Dopplers revealed his endarterectomy site was widely patent with moderate left ICA stenosis and moderate left subclavian artery stenosis without symptoms. He developed symptoms in August of this year and saw [name]. A Myoview stress test performed 01/09/16 was notable for ischemia in the RCA distribution. Based on this he underwent cardiac catheterization by Dr. [name] 01/16/16 revealing a 90% segmental mid dominant RCA stenosis which was stented with a Synergy 3.5 mm x 20 mm long drug-eluting stent. He did have a very small ramus branch that was 90% stenosed and not suitable for intervention as well as a 70% mid LAD lesion  although he had no evidence of anterior ischemia on his stress test. His symptoms of chest pain have resolved.  Since I saw him one year ago he's remained stable. He denies chest pain or shortness of breath.     Current Meds  Medication Sig  . amLODipine (NORVASC) 10 MG tablet Take 0.5 tablets (5 mg total) daily by mouth.  Marland Kitchen aspirin EC 81 MG tablet Take 81 mg by mouth every evening.   Marland Kitchen azelastine (ASTELIN) 0.1 % nasal spray Place 1 spray into both nostrils 2 (two) times daily. Use in each nostril as directed  . cefdinir (OMNICEF) 300 MG capsule Take 1 capsule (300 mg total) by mouth 2 (two) times daily.  . cholecalciferol (VITAMIN D) 1000 UNITS tablet Take 1,000 Units by mouth daily.    . clopidogrel (PLAVIX) 75 MG tablet Take 1 tablet (75 mg total) daily by mouth.  . diclofenac sodium (VOLTAREN) 1 % GEL Apply 2 g topically 4 (four) times daily.  Marland Kitchen dicyclomine (BENTYL) 20 MG tablet TAKE 1 TABLET (20 MG TOTAL) BY MOUTH EVERY 6 (SIX) HOURS AS NEEDED FOR SPASMS.  . Doxepin HCl (SILENOR) 3 MG TABS Take 1 tablet (3 mg total) by mouth at bedtime as needed (sleep).  . fluticasone (FLONASE) 50 MCG/ACT nasal spray Place 1-2 sprays into both nostrils daily as needed for allergies.  . hydrocortisone (ANUSOL-HC) 2.5 % rectal cream Place 1 application rectally 2 (two) times daily as needed for hemorrhoids.   . irbesartan-hydrochlorothiazide (AVALIDE) 300-12.5 MG tablet Take 1 tablet daily by mouth. KEEP  OV.  . Melatonin 5 MG CAPS Take 1 capsule (5 mg total) by mouth daily as needed (sleep).  . methylPREDNISolone (MEDROL DOSEPAK) 4 MG TBPK tablet TAKE AS DIRECTED  . nitroGLYCERIN (NITROSTAT) 0.4 MG SL tablet Place 1 tablet (0.4 mg total) under the tongue every 5 (five) minutes as needed for chest pain.  . polyethylene glycol powder (GLYCOLAX/MIRALAX) powder Take 17 g by mouth as needed. (Patient taking differently: Take 17 g by mouth daily as needed for mild constipation. )  . rosuvastatin (CRESTOR) 10 MG  tablet Take 1 tablet (10 mg total) by mouth daily.  . vitamin C (ASCORBIC ACID) 500 MG tablet Take 500 mg by mouth daily.   . [DISCONTINUED] amLODipine (NORVASC) 10 MG tablet TAKE 1 TABLET BY MOUTH EVERY DAY  . [DISCONTINUED] clopidogrel (PLAVIX) 75 MG tablet TAKE 1 TABLET BY MOUTH EVERY DAY WITH BREAKFAST  . [DISCONTINUED] irbesartan-hydrochlorothiazide (AVALIDE) 300-12.5 MG tablet Take 1 tablet by mouth daily. KEEP OV.     Allergies  Allergen Reactions  . Lipitor [Atorvastatin] Other (See Comments)    myalgias    Social History   Socioeconomic History  . Marital status: Married    Spouse name: Not on file  . Number of children: 2  . Years of education: Not on file  . Highest education level: Not on file  Social Needs  . Financial resource strain: Not on file  . Food insecurity - worry: Not on file  . Food insecurity - inability: Not on file  . Transportation needs - medical: Not on file  . Transportation needs - non-medical: Not on file  Occupational History  . 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 Smoking status: Former Smoker    Packs/day: 1.50    Years: 60.00    Pack years: 90.00    Types: Cigarettes    Last attempt to quit: 07/25/2008    Years since quitting: 8.7  . Smokeless tobacco: Never Used  Substance and Sexual Activity  . Alcohol use: Yes    Alcohol/week: 0.0 oz    Comment: Occ  . Drug use: No  . Sexual activity: Not on file  Other Topics Concern  . Not on file  Social History Narrative  . Not on file     Review of Systems: General: negative for chills, fever, night sweats or weight changes.  Cardiovascular: negative for chest pain, dyspnea on exertion, edema, orthopnea, palpitations, paroxysmal nocturnal dyspnea or shortness of breath Dermatological: negative for rash Respiratory: negative for cough or wheezing Urologic: negative for hematuria Abdominal: negative for nausea, vomiting, diarrhea, bright red blood per rectum, melena, or  hematemesis Neurologic: negative for visual changes, syncope, or dizziness All other systems reviewed and are otherwise negative except as noted above.    Blood pressure 122/62, pulse 82, resp. rate 18, height 5\' 9"  (1.753 m), weight 185 lb 3.2 oz (84 kg).  General appearance: alert and no distress Neck: no adenopathy, no carotid bruit, no JVD, supple, symmetrical, trachea midline and thyroid not enlarged, symmetric, no tenderness/mass/nodules Lungs: clear to auscultation bilaterally Heart: regular rate and rhythm, S1, S2 normal, no murmur, click, rub or gallop Extremities: extremities normal, atraumatic, no cyanosis or edema Pulses: 2+ and symmetric Skin: Skin color, texture, turgor normal. No rashes or lesions Neurologic: Alert and oriented X 3, normal strength and tone. Normal symmetric reflexes. Normal coordination and gait  EKG not performed today  ASSESSMENT AND PLAN:   Hyperlipidemia with target LDL less than 70 History of  hyperlipidemia on low-dose statin therapy with recent lipid profile performed 07/27/16 revealed a total cholesterol 119, LDL 52 and HDL of 42.  Essential hypertension History of essential hypertension blood pressure measured 122/62. He is on amlodipine and Avalide. Continue current meds at current dosing  CAD S/P multiple PCIs History of CAD status post multiple PCI's in the past. He had circumflex angioplasty in 1990 09/13/1993. His most recent cardiac catheterization was performed by Dr. [name] 01/16/16 revealing a 90% segmental mid-dominant RCA stenosis which was stented with a Synergy 3.5 mm x 20 mm long drug-eluting stent. He did have a small ramus branch with 90% stenosis not suitable for intervention as well as a 70% mid LAD lesion thought not to be physiologically significant. He denies chest pain or shortness of breath.  Carotid artery disease- s/p RCE June 2013 History of remote right carotid endarterectomy in 2013 by Dr. early which we followed by 2+  ultrasound on annual basis. This was last checked 06/15/16 revealing a widely patent right endarterectomy site with moderate left ICA stenosis. We will repeat this on an annual basis.      [name] MD FACP,FACC,FAHA, [facility] 04/05/2017 8:02 AM

## 2017-04-05 NOTE — Assessment & Plan Note (Signed)
History of remote right carotid endarterectomy in 2013 by Dr. early which we followed by 2+ ultrasound on annual basis. This was last checked 06/15/16 revealing a widely patent right endarterectomy site with moderate left ICA stenosis. We will repeat this on an annual basis.

## 2017-04-05 NOTE — Patient Instructions (Signed)
Medication Instructions: Your physician recommends that you continue on your current medications as directed. Please refer to the Current Medication list given to you today.   Follow-Up: We request that you follow-up in: 6 months with Rhonda Barrett, PA and in 12 months with Dr Berry  You will receive a reminder letter in the mail two months in advance. If you don't receive a letter, please call our office to schedule the follow-up appointment.  If you need a refill on your cardiac medications before your next appointment, please call your pharmacy.  

## 2017-04-05 NOTE — Assessment & Plan Note (Signed)
History of hyperlipidemia on low-dose statin therapy with recent lipid profile performed 07/27/16 revealed a total cholesterol 119, LDL 52 and HDL of 42.

## 2017-04-05 NOTE — Assessment & Plan Note (Signed)
History of essential hypertension blood pressure measured 122/62. He is on amlodipine and Avalide. Continue current meds at current dosing

## 2017-04-24 ENCOUNTER — Ambulatory Visit: Payer: Self-pay | Admitting: *Deleted

## 2017-04-24 ENCOUNTER — Ambulatory Visit: Payer: PPO | Admitting: Family

## 2017-04-24 NOTE — Telephone Encounter (Signed)
Pt has taken tylenol and used nasal wash and sprays but no relief; pt states that he had chills yesterday; pt advised no appointments available with Dr Ronnald Ramp; conferred with Karsten Fells at [facility]; pt offered and accepted appointment with Jodi Mourning, NP today at 1500; pt's wife verbalizes understanding  Reason for Disposition . [1] Fever returns after gone for over 24 hours AND [2] symptoms worse or not improved  Answer Assessment - Initial Assessment Questions 1. ONSET: "When did the cough begin?"      Sunday 04/22/17 2. SEVERITY: "How bad is the cough today?"      bad 3. RESPIRATORY DISTRESS: "Describe your breathing."      Yes; norm is to prop himself up  4. FEVER: "Do you have a fever?" If so, ask: "What is your temperature, how was it measured, and when did it start?"     Yes didn't use thermometer; pt's wife says "he felt hot"  5. SPUTUM: "Describe the color of your sputum" (clear, white, yellow, green)     Yellow and clear mixed  6. HEMOPTYSIS: "Are you coughing up any blood?" If so ask: "How much?" (flecks, streaks, tablespoons, etc.)     no 7. CARDIAC HISTORY: "Do you have any history of heart disease?" (e.g., heart attack, congestive heart failure)      no 8. LUNG HISTORY: "Do you have any history of lung disease?"  (e.g., pulmonary embolus, asthma, emphysema)     no 9. PE RISK FACTORS: "Do you have a history of blood clots?" (or: recent major surgery, recent prolonged travel, bedridden )     no 10. OTHER SYMPTOMS: "Do you have any other symptoms?" (e.g., runny nose, wheezing, chest pain)       Runny nose 11. PREGNANCY: "Is there any chance you are pregnant?" "When was your last menstrual period?"       n/a 12. TRAVEL: "Have you traveled out of the country in the last month?" (e.g., travel history, exposures)       no  Protocols used: [place]

## 2017-04-25 ENCOUNTER — Ambulatory Visit: Payer: PPO | Admitting: Family Medicine

## 2017-04-25 ENCOUNTER — Encounter: Payer: Self-pay | Admitting: Family Medicine

## 2017-04-25 VITALS — BP 128/60 | HR 101 | Temp 97.8°F | Ht 69.0 in | Wt 186.0 lb

## 2017-04-25 DIAGNOSIS — J40 Bronchitis, not specified as acute or chronic: Secondary | ICD-10-CM

## 2017-04-25 DIAGNOSIS — R059 Cough, unspecified: Secondary | ICD-10-CM

## 2017-04-25 DIAGNOSIS — R05 Cough: Secondary | ICD-10-CM

## 2017-04-25 MED ORDER — AMOXICILLIN 875 MG PO TABS: 875.0000 mg | ORAL_TABLET | Freq: Two times a day (BID) | ORAL | 0 refills | Status: DC

## 2017-04-25 MED ORDER — METHYLPREDNISOLONE ACETATE 80 MG/ML IJ SUSP
80.0000 mg | Freq: Once | INTRAMUSCULAR | Status: AC
  Administered 2017-04-25: 80 mg via INTRAMUSCULAR

## 2017-04-25 NOTE — Progress Notes (Signed)
    Subjective:  Dillon Young is a 83 y.o. male who presents today with a chief complaint of cough.   HPI:  Cough, acute issue Started 3-4 days ago. Worsened over that time. Associated symptoms include sore throat, rhinorrhea, and fever.  Try taking Mucinex which did not centrically seem to help.  Symptoms are worse at night.  No other treatments tried.  No known sick contacts.  No obvious other alleviating or aggravating factors.  ROS: Per HPI  PMH: Smoking history reviewed.  Former smoker.  Objective:  Physical Exam: BP 128/60 (BP Location: Left Arm, Patient Position: Sitting, Cuff Size: Normal)   Pulse (!) 101   Temp 97.8 F (36.6 C) (Oral)   Ht 5\' 9"  (1.753 m)   Wt 186 lb (84.4 kg)   SpO2 95%   BMI 27.47 kg/m   Gen: NAD, resting comfortably  HEENT: TMs clear bilaterally.  Oropharynx clear.  No lymphadenopathy.  Maxillary sinuses clear to transillumination. CV: RRR with no murmurs appreciated Pulm: Normal work of breathing.  Speaking in full sentences.  Rhonchorous breath sound noted in right upper lobe with end expiratory wheeze.  Findings did not resolve with cough.  Assessment/Plan:  Cough Patient has lung exam findings consistent with acute bronchitis.  Given his elderly state, lower O2 sat, and findings consistent with possible bronchitis, it was decided to treat with empiric antibiotics.  Patient deferred azithromycin.  Will start amoxicillin for 7-day course.  Also gave shot of 80 mg Depo-Medrol IM.  Encouraged good oral hydration.  Strict return precautions reviewed.  Follow-up as needed.  Algis Greenhouse. Jerline Pain, MD 04/25/2017 10:20 AM

## 2017-04-25 NOTE — Patient Instructions (Signed)
Start the amoxil.  Let us know if not getting better in a few days, or if worsening.,  Take care,  Dr Jerline Pain

## 2017-04-26 ENCOUNTER — Telehealth: Payer: Self-pay | Admitting: Family Medicine

## 2017-04-26 ENCOUNTER — Telehealth: Payer: Self-pay

## 2017-04-26 MED ORDER — BENZONATATE 100 MG PO CAPS: 100.0000 mg | ORAL_CAPSULE | Freq: Two times a day (BID) | ORAL | 0 refills | Status: DC | PRN

## 2017-04-26 NOTE — Telephone Encounter (Signed)
Please advise 

## 2017-04-26 NOTE — Telephone Encounter (Signed)
Pt is aware via mychart that medication has been sent into pharmacy.

## 2017-04-26 NOTE — Telephone Encounter (Signed)
This is a duplicate message on patient.

## 2017-04-26 NOTE — Telephone Encounter (Signed)
Copied from [place] [id number]. Topic: Quick Communication - See Telephone Encounter >> Apr 26, 2017  8:42 AM Synthia Innocent wrote: CRM for notification. See Telephone encounter for: was seen yesterday by Dr Jerline Pain, requesting cough meds be called in. [facility]  [facility]/26/17. >> Apr 26, 2017  1:01 PM Neva Seat wrote: Pt called to check on the status of cough medicine request status for refill >> Apr 26, 2017  1:09 PM Boyd Kerbs wrote: Patient called again, this is [facility] [address].  Have have not received prescription.

## 2017-04-26 NOTE — Telephone Encounter (Signed)
Patient needs an appointment for refills

## 2017-04-26 NOTE — Telephone Encounter (Signed)
Copied from [place] [id number]. Topic: Quick Communication - See Telephone Encounter >> Apr 26, 2017  8:42 AM Synthia Innocent wrote: CRM for notification. See Telephone encounter for: was seen yesterday by Dr Jerline Pain, requesting cough meds be called in. [facility]  04/26/17.

## 2017-04-26 NOTE — Telephone Encounter (Signed)
Copied from [place] [id number]. Topic: Quick Communication - See Telephone Encounter >> Apr 26, 2017  8:42 AM Synthia Innocent wrote: CRM for notification. See Telephone encounter for: was seen yesterday by Dr Jerline Pain, requesting cough meds be called in. [facility]  04/26/17.

## 2017-04-26 NOTE — Telephone Encounter (Signed)
I sent in tessalon for him as saw this on prior med list. No record of hydromet that I can see. May return for visit next week for Dr. Ronnald Ramp or Dr. Jerline Pain if want to consider controlled substance.   Garret Reddish

## 2017-04-26 NOTE — Telephone Encounter (Signed)
LOV 04/15/17 for bronchitis. Requesting cough med

## 2017-04-26 NOTE — Telephone Encounter (Signed)
Please, advise patient seen by Dr, Jerline Pain,  Send to [place] [facility] for cough medicine

## 2017-04-26 NOTE — Telephone Encounter (Signed)
Tried calling patient with busy signal.

## 2017-04-26 NOTE — Telephone Encounter (Signed)
Pt was seen by Dr. Jerline Pain yesterday, DX with Bronchitis. He is currently on Amoxcillin but declined a cough medication yesterday. Now pt is calling back wanting something to help with sleep. His cough kept him up most of the night. He has used Hydromet in the past with good results. Please advise in Dr. Ellwood Handler absence.

## 2017-05-01 ENCOUNTER — Telehealth: Payer: Self-pay | Admitting: Internal Medicine

## 2017-05-01 NOTE — Telephone Encounter (Signed)
Patient states he seen parker for cold.  He was prescribed tessalon for his cough.  Patient was told that his primary care provider is the only provider who could prescribe hydrocodone cough syrup.  Patient will have taken last antibiotic today.  States that he feels like he has not completley cleared up.  Patient was scheduled to follow up with Raeford Razor tomorrow but due to provider having to be out of the office I have scheduled patient to follow back up with Jerline Pain regard.  Patient would like to know if Dr. Ronnald Ramp could call in the syrup?  [facility] [address].

## 2017-05-02 ENCOUNTER — Encounter: Payer: Self-pay | Admitting: Family Medicine

## 2017-05-02 ENCOUNTER — Ambulatory Visit (INDEPENDENT_AMBULATORY_CARE_PROVIDER_SITE_OTHER): Payer: PPO

## 2017-05-02 ENCOUNTER — Ambulatory Visit: Payer: PPO | Admitting: Family Medicine

## 2017-05-02 VITALS — BP 125/69 | HR 52 | Temp 98.2°F | Ht 69.0 in | Wt 186.0 lb

## 2017-05-02 DIAGNOSIS — R05 Cough: Secondary | ICD-10-CM

## 2017-05-02 DIAGNOSIS — R059 Cough, unspecified: Secondary | ICD-10-CM

## 2017-05-02 DIAGNOSIS — I700 Atherosclerosis of aorta: Secondary | ICD-10-CM | POA: Diagnosis not present

## 2017-05-02 MED ORDER — PREDNISONE 20 MG PO TABS: 40.0000 mg | ORAL_TABLET | Freq: Every day | ORAL | 0 refills | Status: DC

## 2017-05-02 MED ORDER — GUAIFENESIN-CODEINE 100-10 MG/5ML PO SYRP: 5.0000 mL | ORAL_SOLUTION | Freq: Three times a day (TID) | ORAL | 0 refills | Status: DC | PRN

## 2017-05-02 MED ORDER — DOXYCYCLINE HYCLATE 100 MG PO TABS: 100.0000 mg | ORAL_TABLET | Freq: Two times a day (BID) | ORAL | 0 refills | Status: DC

## 2017-05-02 MED ORDER — ALBUTEROL SULFATE HFA 108 (90 BASE) MCG/ACT IN AERS: 2.0000 | INHALATION_SPRAY | Freq: Four times a day (QID) | RESPIRATORY_TRACT | 0 refills | Status: DC | PRN

## 2017-05-02 NOTE — Patient Instructions (Signed)
Start the doxycycline, prednisone, and cough syrup.  Also use the albuterol as needed for wheezing and shortness of breath.  Come back if worsening or not improving in 1 week.  Take care,  Dr Jerline Pain

## 2017-05-02 NOTE — Progress Notes (Signed)
    Subjective:  Dillon Young is a 83 y.o. male who presents today with a chief complaint of cough.   HPI:  Cough, Established Problem Patient seen one week ago and diagnosed with bronchitis. He was treated with a course of amoxilcilin and given an injection of depomedrol. Symptoms have not significantly improved since then. He was able to start tessalon which has not helped. He has also been trying mucinex which does not significantly seem to help either. No fevers.   ROS: Per HPI  PMH: Smoking history reviewed. Former smoker.   Objective:  Physical Exam: BP 125/69   Pulse (!) 52   Temp 98.2 F (36.8 C) (Oral)   Ht 5\' 9"  (1.753 m)   Wt 186 lb (84.4 kg)   SpO2 97%   BMI 27.47 kg/m   Gen: NAD, resting comfortably CV: RRR with ectopy noted. No murmurs appreciated Pulm: NWOB, End expiratory wheeze noted and left lower lung base.  Neuro: Grossly normal, moves all extremities Psych: Normal affect and thought content  CXR: (My read) mild flattening of left hemidiaphragm.  No obvious infiltrates noted.   Assessment/Plan:  Cough Patient without improvement of his symptoms over the last week.  His respiratory status is stable today, however he still has a few end expiratory wheezes.  Given his significant smoking history, at this time we will treat for presumed COPD exacerbation even though he does not have a formal diagnosis of COPD.  Chest x-ray does not show any definitive infiltrate-we will await radiology read.  Start doxycycline 100 mg twice daily for 7-day course.  Start prednisone for 5-day course.  Also gave a prescription for albuterol inhaler.  Patient requested a stronger cough medication.  Sent in cough syrup with codeine and discussed risks associated with this-advised him to take a small dose as needed.  Return precautions reviewed.  Advised patient to have PFTs done soon to further evaluate his lung function.  Aortic atherosclerosis ([place]) Incidentally noted on chest  x-ray.  He is on statin and aspirin therapy.  Time Spent: I spent 25 minutes face-to-face with the patient, with more than half spent on counseling on treatment and underlying etiology for his cough, reviewing his chest x-ray with the patient, natural course of illness, and return precautions.   Algis Greenhouse. Jerline Pain, MD 05/02/2017 2:13 PM

## 2017-05-02 NOTE — Assessment & Plan Note (Signed)
Incidentally noted on chest x-ray.  He is on statin and aspirin therapy.

## 2017-05-02 NOTE — Telephone Encounter (Signed)
Forwarding to Dr. Jerline Pain as patient is following up with him today. Dr. Ronnald Ramp will not be filling this request.

## 2017-05-02 NOTE — Telephone Encounter (Signed)
Pt is being seen by Dr. Jerline Pain again today.   Rx may not have been appropriate given pt low O2 sat at visit on 04/26/2017.

## 2017-05-18 ENCOUNTER — Other Ambulatory Visit: Payer: Self-pay | Admitting: Cardiovascular Disease

## 2017-05-20 NOTE — Telephone Encounter (Signed)
REFILL 

## 2017-06-13 ENCOUNTER — Ambulatory Visit (HOSPITAL_COMMUNITY)
Admission: RE | Admit: 2017-06-13 | Discharge: 2017-06-13 | Disposition: A | Discharge status: Home / Self Care | Payer: PPO | Source: Ambulatory Visit | Attending: Cardiology | Admitting: Cardiology

## 2017-06-13 DIAGNOSIS — I739 Peripheral vascular disease, unspecified: Secondary | ICD-10-CM

## 2017-06-13 DIAGNOSIS — I779 Disorder of arteries and arterioles, unspecified: Secondary | ICD-10-CM

## 2017-06-17 ENCOUNTER — Other Ambulatory Visit: Payer: Self-pay | Admitting: Cardiovascular Disease

## 2017-06-17 DIAGNOSIS — I6523 Occlusion and stenosis of bilateral carotid arteries: Secondary | ICD-10-CM

## 2017-07-29 DIAGNOSIS — N200 Calculus of kidney: Secondary | ICD-10-CM | POA: Diagnosis not present

## 2017-07-29 DIAGNOSIS — R310 Gross hematuria: Secondary | ICD-10-CM | POA: Diagnosis not present

## 2017-07-29 DIAGNOSIS — Z8546 Personal history of malignant neoplasm of prostate: Secondary | ICD-10-CM | POA: Diagnosis not present

## 2017-08-13 ENCOUNTER — Other Ambulatory Visit: Payer: Self-pay | Admitting: Family Medicine

## 2017-08-14 ENCOUNTER — Encounter: Payer: Self-pay | Admitting: Internal Medicine

## 2017-08-14 ENCOUNTER — Ambulatory Visit (INDEPENDENT_AMBULATORY_CARE_PROVIDER_SITE_OTHER): Payer: PPO | Admitting: Internal Medicine

## 2017-08-14 ENCOUNTER — Ambulatory Visit (INDEPENDENT_AMBULATORY_CARE_PROVIDER_SITE_OTHER)
Admission: RE | Admit: 2017-08-14 | Discharge: 2017-08-14 | Disposition: A | Discharge status: Home / Self Care | Payer: PPO | Source: Ambulatory Visit | Attending: Internal Medicine | Admitting: Internal Medicine

## 2017-08-14 VITALS — BP 128/62 | HR 87 | Temp 97.8°F | Ht 69.0 in | Wt 189.1 lb

## 2017-08-14 DIAGNOSIS — R05 Cough: Secondary | ICD-10-CM | POA: Diagnosis not present

## 2017-08-14 DIAGNOSIS — R0602 Shortness of breath: Secondary | ICD-10-CM | POA: Diagnosis not present

## 2017-08-14 DIAGNOSIS — J988 Other specified respiratory disorders: Secondary | ICD-10-CM | POA: Diagnosis not present

## 2017-08-14 DIAGNOSIS — R059 Cough, unspecified: Secondary | ICD-10-CM

## 2017-08-14 LAB — POCT EXHALED NITRIC OXIDE: FeNO level (ppb): 31

## 2017-08-14 MED ORDER — METHYLPREDNISOLONE 4 MG PO TBPK: ORAL_TABLET | ORAL | 0 refills | Status: AC

## 2017-08-14 MED ORDER — GUAIFENESIN-CODEINE 100-10 MG/5ML PO SYRP: 5.0000 mL | ORAL_SOLUTION | Freq: Three times a day (TID) | ORAL | 0 refills | Status: DC | PRN

## 2017-08-14 MED ORDER — CEFDINIR 300 MG PO CAPS: 300.0000 mg | ORAL_CAPSULE | Freq: Two times a day (BID) | ORAL | 0 refills | Status: AC

## 2017-08-14 NOTE — Patient Instructions (Signed)
Cough, Adult  Coughing is a reflex that clears your throat and your airways. Coughing helps to heal and protect your lungs. It is normal to cough occasionally, but a cough that happens with other symptoms or lasts a long time may be a sign of a condition that needs treatment. A cough may last only 2-3 weeks (acute), or it may last longer than 8 weeks (chronic).  What are the causes?  Coughing is commonly caused by:   Breathing in substances that irritate your lungs.   A viral or bacterial respiratory infection.   Allergies.   Asthma.   Postnasal drip.   Smoking.   Acid backing up from the stomach into the esophagus (gastroesophageal reflux).   Certain medicines.   Chronic lung problems, including COPD (or rarely, lung cancer).   Other medical conditions such as heart failure.    Follow these instructions at home:  Pay attention to any changes in your symptoms. Take these actions to help with your discomfort:   Take medicines only as told by your health care provider.  ? If you were prescribed an antibiotic medicine, take it as told by your health care provider. Do not stop taking the antibiotic even if you start to feel better.  ? Talk with your health care provider before you take a cough suppressant medicine.   Drink enough fluid to keep your urine clear or pale yellow.   If the air is dry, use a cold steam vaporizer or humidifier in your bedroom or your home to help loosen secretions.   Avoid anything that causes you to cough at work or at home.   If your cough is worse at night, try sleeping in a semi-upright position.   Avoid cigarette smoke. If you smoke, quit smoking. If you need help quitting, ask your health care provider.   Avoid caffeine.   Avoid alcohol.   Rest as needed.    Contact a health care provider if:   You have new symptoms.   You cough up pus.   Your cough does not get better after 2-3 weeks, or your cough gets worse.   You cannot control your cough with suppressant  medicines and you are losing sleep.   You develop pain that is getting worse or pain that is not controlled with pain medicines.   You have a fever.   You have unexplained weight loss.   You have night sweats.  Get help right away if:   You cough up blood.   You have difficulty breathing.   Your heartbeat is very fast.  This information is not intended to replace advice given to you by your health care provider. Make sure you discuss any questions you have with your health care provider.  Document Released: 11/10/2010 Document Revised: 10/20/2015 Document Reviewed: 07/21/2014  Elsevier Interactive Patient Education  2018 [employer] Inc[employer]

## 2017-08-14 NOTE — Progress Notes (Signed)
Subjective:  Patient ID: Dillon Young, male    DOB: [birth year]  Age: 84 y.o. MRN: [id number]  CC: Cough (productive)   HPI [name] presents for a 5-day history of cough productive of thick brown phlegm with sore throat, chills, shortness of breath, and wheezing.  He denies fever, chest pain, or hemoptysis.  He has a history of asthma and has been getting some symptom relief with albuterol.  Outpatient Medications Prior to Visit  Medication Sig Dispense Refill  . albuterol (PROVENTIL HFA;VENTOLIN HFA) 108 (90 Base) MCG/ACT inhaler Inhale 2 puffs into the lungs every 6 (six) hours as needed for wheezing or shortness of breath. 1 Inhaler 0  . amLODipine (NORVASC) 10 MG tablet Take 0.5 tablets (5 mg total) daily by mouth. 45 tablet 1  . aspirin EC 81 MG tablet Take 81 mg by mouth every evening.     Marland Kitchen azelastine (ASTELIN) 0.1 % nasal spray Place 1 spray into both nostrils 2 (two) times daily. Use in each nostril as directed 30 mL 11  . cholecalciferol (VITAMIN D) 1000 UNITS tablet Take 1,000 Units by mouth daily.      . clopidogrel (PLAVIX) 75 MG tablet Take 1 tablet (75 mg total) daily by mouth. 90 tablet 1  . diclofenac sodium (VOLTAREN) 1 % GEL Apply 2 g topically 4 (four) times daily. 100 g 11  . dicyclomine (BENTYL) 20 MG tablet TAKE 1 TABLET (20 MG TOTAL) BY MOUTH EVERY 6 (SIX) HOURS AS NEEDED FOR SPASMS. 30 tablet 2  . Doxepin HCl (SILENOR) 3 MG TABS Take 1 tablet (3 mg total) by mouth at bedtime as needed (sleep).    . fluticasone (FLONASE) 50 MCG/ACT nasal spray Place 1-2 sprays into both nostrils daily as needed for allergies. 48 g 1  . hydrocortisone (ANUSOL-HC) 2.5 % rectal cream Place 1 application rectally 2 (two) times daily as needed for hemorrhoids.     . irbesartan-hydrochlorothiazide (AVALIDE) 300-12.5 MG tablet Take 1 tablet daily by mouth. KEEP OV. 90 tablet 1  . Melatonin 5 MG CAPS Take 1 capsule (5 mg total) by mouth daily as needed (sleep).    . nitroGLYCERIN  (NITROSTAT) 0.4 MG SL tablet Place 1 tablet (0.4 mg total) under the tongue every 5 (five) minutes as needed for chest pain. 25 tablet 3  . polyethylene glycol powder (GLYCOLAX/MIRALAX) powder Take 17 g by mouth as needed. (Patient taking differently: Take 17 g by mouth daily as needed for mild constipation. ) 255 g 3  . rosuvastatin (CRESTOR) 10 MG tablet TAKE 1 TABLET BY MOUTH EVERY DAY 90 tablet 1  . vitamin C (ASCORBIC ACID) 500 MG tablet Take 500 mg by mouth daily.     . benzonatate (TESSALON) 100 MG capsule Take 1 capsule (100 mg total) by mouth 2 (two) times daily as needed for cough. 20 capsule 0  . doxycycline (VIBRA-TABS) 100 MG tablet Take 1 tablet (100 mg total) by mouth 2 (two) times daily. 14 tablet 0  . guaiFENesin-codeine (ROBITUSSIN AC) 100-10 MG/5ML syrup Take 5 mLs by mouth 3 (three) times daily as needed for cough. 120 mL 0  . predniSONE (DELTASONE) 20 MG tablet Take 2 tablets (40 mg total) by mouth daily with breakfast. 10 tablet 0   No facility-administered medications prior to visit.     ROS Review of Systems  Constitutional: Positive for chills. Negative for fatigue and fever.  HENT: Positive for sore throat. Negative for trouble swallowing and voice change.  Eyes: Negative.   Respiratory: Positive for cough, shortness of breath and wheezing. Negative for chest tightness.   Cardiovascular: Negative for chest pain, palpitations and leg swelling.  Gastrointestinal: Negative for abdominal pain, constipation, diarrhea, nausea and vomiting.  Endocrine: Negative.   Genitourinary: Negative.  Negative for difficulty urinating.  Musculoskeletal: Negative.  Negative for arthralgias and myalgias.  Skin: Negative.  Negative for color change, pallor and rash.  Neurological: Negative.  Negative for dizziness, weakness and light-headedness.  Hematological: Negative for adenopathy. Does not bruise/bleed easily.  Psychiatric/Behavioral: Negative.     Objective:  BP 128/62 (BP  Location: Left Arm, Patient Position: Sitting, Cuff Size: Large)   Pulse 87   Temp 97.8 F (36.6 C) (Oral)   Ht 5\' 9"  (1.753 m)   Wt 189 lb 1.9 oz (85.8 kg)   SpO2 98%   BMI 27.93 kg/m   BP Readings from Last 3 Encounters:  08/14/17 128/62  05/02/17 125/69  04/25/17 128/60    Wt Readings from Last 3 Encounters:  08/14/17 189 lb 1.9 oz (85.8 kg)  05/02/17 186 lb (84.4 kg)  04/25/17 186 lb (84.4 kg)    Physical Exam  Constitutional: He is oriented to person, place, and time.  Non-toxic appearance. He does not have a sickly appearance. He does not appear ill. No distress.  HENT:  Mouth/Throat: Oropharynx is clear and moist. No oropharyngeal exudate.  Eyes: Conjunctivae are normal. No scleral icterus.  Neck: Normal range of motion. Neck supple. No JVD present. No thyromegaly present.  Cardiovascular: Normal rate, regular rhythm and intact distal pulses. Frequent extrasystoles are present. Exam reveals no gallop.  No murmur heard. Pulmonary/Chest: Effort normal and breath sounds normal. No respiratory distress. He has no wheezes. He has no rales.  Abdominal: Soft. Bowel sounds are normal. He exhibits no distension and no mass. There is no tenderness. There is no guarding.  Musculoskeletal: Normal range of motion. He exhibits no edema, tenderness or deformity.  Lymphadenopathy:    He has no cervical adenopathy.  Neurological: He is alert and oriented to person, place, and time.  Skin: Skin is warm and dry. No rash noted. He is not diaphoretic. No erythema. No pallor.    Lab Results  Component Value Date   WBC 5.6 05/03/2016   HGB 12.7 (L) 05/03/2016   HCT 37.6 (L) 05/03/2016   PLT 240.0 05/03/2016   GLUCOSE 145 (H) 04/11/2016   CHOL 119 07/27/2016   TRIG 126 07/27/2016   HDL 42 07/27/2016   LDLCALC 52 07/27/2016   ALT 11 07/27/2016   AST 16 07/27/2016   NA 141 04/11/2016   K 4.6 04/11/2016   CL 103 04/11/2016   CREATININE 1.12 04/11/2016   BUN 22 04/11/2016   CO2 30  04/11/2016   TSH 1.63 12/15/2015   PSA 0.01 (L) 07/05/2014   INR 1.06 01/15/2016   HGBA1C 6.6 (H) 04/11/2016   MICROALBUR 6.1 (H) 08/15/2015    No results found.  Assessment & Plan:   Dillon Young was seen today for cough.  Diagnoses and all orders for this visit:  Cough- His FeNO score is mildly elevated so I am concerned that he is having an exacerbation of asthma.  I have asked him to take a course of systemic steroids. -     DG Chest 2 View; Future -     guaiFENesin-codeine (ROBITUSSIN AC) 100-10 MG/5ML syrup; Take 5 mLs by mouth 3 (three) times daily as needed for cough. -     methylPREDNISolone (  MEDROL DOSEPAK) 4 MG TBPK tablet; TAKE AS DIRECTED -     POCT EXHALED NITRIC OXIDE  RTI (respiratory tract infection)-I will treat the infection with Omnicef and will control the cough with Robitussin AC. -     cefdinir (OMNICEF) 300 MG capsule; Take 1 capsule (300 mg total) by mouth 2 (two) times daily for 10 days. -     guaiFENesin-codeine (ROBITUSSIN AC) 100-10 MG/5ML syrup; Take 5 mLs by mouth 3 (three) times daily as needed for cough. -     methylPREDNISolone (MEDROL DOSEPAK) 4 MG TBPK tablet; TAKE AS DIRECTED   I have discontinued Princess Bruins. [place] "Bill"'s benzonatate, doxycycline, and predniSONE. I am also having him start on cefdinir and methylPREDNISolone. Additionally, I am having him maintain his vitamin C, cholecalciferol, aspirin EC, polyethylene glycol powder, hydrocortisone, nitroGLYCERIN, Doxepin HCl, Melatonin, dicyclomine, diclofenac sodium, fluticasone, azelastine, amLODipine, clopidogrel, irbesartan-hydrochlorothiazide, albuterol, rosuvastatin, and guaiFENesin-codeine.  Meds ordered this encounter  Medications  . cefdinir (OMNICEF) 300 MG capsule    Sig: Take 1 capsule (300 mg total) by mouth 2 (two) times daily for 10 days.    Dispense:  20 capsule    Refill:  0  . guaiFENesin-codeine (ROBITUSSIN AC) 100-10 MG/5ML syrup    Sig: Take 5 mLs by mouth 3 (three) times  daily as needed for cough.    Dispense:  120 mL    Refill:  0  . methylPREDNISolone (MEDROL DOSEPAK) 4 MG TBPK tablet    Sig: TAKE AS DIRECTED    Dispense:  21 tablet    Refill:  0     Follow-up: Return in about 2 weeks (around 08/28/2017).  [name], MD

## 2017-08-22 ENCOUNTER — Telehealth: Payer: Self-pay | Admitting: Cardiovascular Disease

## 2017-08-22 NOTE — Telephone Encounter (Signed)
.  New Message:    Pt wants his Amlodipine changed to 5mg  please.It is hard to cut the 10mg .

## 2017-08-23 MED ORDER — AMLODIPINE BESYLATE 5 MG PO TABS: 5.0000 mg | ORAL_TABLET | Freq: Every day | ORAL | 1 refills | Status: DC

## 2017-08-23 NOTE — Telephone Encounter (Signed)
Fine with me

## 2017-08-26 NOTE — Telephone Encounter (Signed)
Rx for Amlodipine 5 sent to pharmacy on 08/23/17

## 2017-09-09 DIAGNOSIS — Z8546 Personal history of malignant neoplasm of prostate: Secondary | ICD-10-CM | POA: Diagnosis not present

## 2017-09-09 LAB — PSA: PSA: 0.015

## 2017-09-10 DIAGNOSIS — N200 Calculus of kidney: Secondary | ICD-10-CM | POA: Diagnosis not present

## 2017-09-10 DIAGNOSIS — R310 Gross hematuria: Secondary | ICD-10-CM | POA: Diagnosis not present

## 2017-09-18 DIAGNOSIS — N210 Calculus in bladder: Secondary | ICD-10-CM | POA: Diagnosis not present

## 2017-09-18 DIAGNOSIS — N200 Calculus of kidney: Secondary | ICD-10-CM | POA: Diagnosis not present

## 2017-09-18 DIAGNOSIS — R310 Gross hematuria: Secondary | ICD-10-CM | POA: Diagnosis not present

## 2017-09-18 DIAGNOSIS — Z8546 Personal history of malignant neoplasm of prostate: Secondary | ICD-10-CM | POA: Diagnosis not present

## 2017-09-18 DIAGNOSIS — N3041 Irradiation cystitis with hematuria: Secondary | ICD-10-CM | POA: Diagnosis not present

## 2017-10-02 ENCOUNTER — Telehealth: Payer: Self-pay | Admitting: Pharmacist Clinician (PhC)/ Clinical Pharmacy Specialist

## 2017-10-02 ENCOUNTER — Ambulatory Visit: Payer: PPO | Admitting: Cardiovascular Disease

## 2017-10-02 ENCOUNTER — Encounter: Payer: Self-pay | Admitting: Cardiovascular Disease

## 2017-10-02 VITALS — BP 146/88 | HR 93 | Ht 69.0 in | Wt 186.0 lb

## 2017-10-02 DIAGNOSIS — I714 Abdominal aortic aneurysm, without rupture, unspecified: Secondary | ICD-10-CM

## 2017-10-02 DIAGNOSIS — E785 Hyperlipidemia, unspecified: Secondary | ICD-10-CM | POA: Diagnosis not present

## 2017-10-02 DIAGNOSIS — I481 Persistent atrial fibrillation: Secondary | ICD-10-CM

## 2017-10-02 DIAGNOSIS — I6523 Occlusion and stenosis of bilateral carotid arteries: Secondary | ICD-10-CM

## 2017-10-02 DIAGNOSIS — I4891 Unspecified atrial fibrillation: Secondary | ICD-10-CM | POA: Diagnosis not present

## 2017-10-02 DIAGNOSIS — I10 Essential (primary) hypertension: Secondary | ICD-10-CM | POA: Diagnosis not present

## 2017-10-02 DIAGNOSIS — I4819 Other persistent atrial fibrillation: Secondary | ICD-10-CM

## 2017-10-02 LAB — BASIC METABOLIC PANEL
BUN / CREAT RATIO: 14 (ref 10–24)
BUN: 16 mg/dL (ref 8–27)
CHLORIDE: 98 mmol/L (ref 96–106)
CO2: 27 mmol/L (ref 20–29)
Calcium: 10 mg/dL (ref 8.6–10.2)
Creatinine, Ser: 1.14 mg/dL (ref 0.76–1.27)
GFR calc Af Amer: 68 mL/min/{1.73_m2} (ref >59)
GFR calc non Af Amer: 59 mL/min/{1.73_m2} — ABNORMAL LOW (ref >59)
GLUCOSE: 114 mg/dL — AB (ref 65–99)
POTASSIUM: 4.7 mmol/L (ref 3.5–5.2)
Sodium: 142 mmol/L (ref 134–144)

## 2017-10-02 LAB — LIPID PANEL
CHOL/HDL RATIO: 2.6 ratio (ref 0.0–5.0)
Cholesterol, Total: 121 mg/dL (ref 100–199)
HDL: 46 mg/dL (ref >39)
LDL CALC: 50 mg/dL (ref 0–99)
Triglycerides: 127 mg/dL (ref 0–149)
VLDL CHOLESTEROL CAL: 25 mg/dL (ref 5–40)

## 2017-10-02 LAB — HEPATIC FUNCTION PANEL
ALBUMIN: 4.9 g/dL — AB (ref 3.5–4.7)
ALK PHOS: 57 IU/L (ref 39–117)
ALT: 13 IU/L (ref 0–44)
AST: 17 IU/L (ref 0–40)
BILIRUBIN, DIRECT: 0.24 mg/dL (ref 0.00–0.40)
Bilirubin Total: 0.8 mg/dL (ref 0.0–1.2)
TOTAL PROTEIN: 7.1 g/dL (ref 6.0–8.5)

## 2017-10-02 MED ORDER — DILTIAZEM HCL ER COATED BEADS 120 MG PO CP24: 120.0000 mg | ORAL_CAPSULE | Freq: Every day | ORAL | 1 refills | Status: DC

## 2017-10-02 MED ORDER — IRBESARTAN-HYDROCHLOROTHIAZIDE 300-12.5 MG PO TABS: 1.0000 | ORAL_TABLET | Freq: Every day | ORAL | 3 refills | Status: DC

## 2017-10-02 NOTE — Telephone Encounter (Signed)
Patient was in office to see Dr. [name] today.  New onset AF, will d/c clopidogrel but continue ASA.    Pt was started on Eliquis for AF today.    Reviewed patients medication list.  Pt is not currently on any combined P-gp and strong CYP3A4 inhibitors/inducers (ketoconazole, traconazole, ritonavir, carbamazepine, phenytoin, rifampin, St. John's wort).  Reviewed labs. Weight 84.4 kg, age 84.  Patient has not had recent BMET, SCr not available.  Patient will get labs today to verify dose.    A full discussion of the nature of anticoagulants has been carried out.  A benefit/risk analysis has been presented to the patient, so that they understand the justification for choosing anticoagulation with Eliquis at this time.  The need for compliance is stressed.  Pt is aware to take the medication twice daily.  Side effects of potential bleeding are discussed, including unusual colored urine or stools, coughing up blood or coffee ground emesis, nose bleeds or serious fall or head trauma.  Discussed signs and symptoms of stroke. The patient should avoid any OTC items containing aspirin or ibuprofen.  Avoid alcohol consumption.   Call if any signs of abnormal bleeding.  Discussed financial obligations and resolved any difficulty in obtaining medication.  Next lab test in 6 months.

## 2017-10-02 NOTE — Patient Instructions (Addendum)
Medication Instructions: Your physician recommends that you continue on your current medications as directed. Please refer to the Current Medication list given to you today.  STOP Amlodipine START Diltiazem 120 mg daily.  STOP Plavix START Eliquis---samples given  Labwork: Your physician recommends that you return for a FASTING lipid profile and hepatic function panel, and BMET today.  Testing/Procedures: Your physician has requested that you have a carotid duplex in 1 year. This test is an ultrasound of the carotid arteries in your neck. It looks at blood flow through these arteries that supply the brain with blood. Allow one hour for this exam. There are no restrictions or special instructions.  NOW: Your physician has requested that you have an abdominal aorta duplex. During this test, an ultrasound is used to evaluate the aorta. Allow 30 minutes for this exam. Do not eat after midnight the day before and avoid carbonated beverages.  Your physician has requested that you have an echocardiogram. Echocardiography is a painless test that uses sound waves to create images of your heart. It provides your doctor with information about the size and shape of your heart and how well your heart's chambers and valves are working. This procedure takes approximately one hour. There are no restrictions for this procedure.  Your physician has recommended that you wear a 7 day event monitor--after 1 week of diltiazem. Event monitors are medical devices that record the heart's electrical activity. Doctors most often [place] these monitors to diagnose arrhythmias. Arrhythmias are problems with the speed or rhythm of the heartbeat. The monitor is a small, portable device. You can wear one while you do your normal daily activities. This is usually used to diagnose what is causing palpitations/syncope (passing out).   Follow-Up: We request that you follow-up in: 3 months with an extender and in 6 months with Dr  Andria Rhein will receive a reminder letter in the mail two months in advance. If you don't receive a letter, please call our office to schedule the follow-up appointment.  If you need a refill on your cardiac medications before your next appointment, please call your pharmacy.

## 2017-10-02 NOTE — Assessment & Plan Note (Addendum)
History of CAD status post coronary intervention in 1994 1995 of the circumflex coronary artery.  He had cardiac catheterization performed by Dr. [name] 01/16/2016 after a Myoview showed ischemia in the RCA territory.  He had a high-grade mid dominant RCA stenosis that underwent stenting by Dr. [name] with a synergy 3.5 mm x 20 mm long drug-eluting stent.  He did have a small ramus branch that was 90% stenosed not suitable for intervention a 70% mid LAD lesion that was treated medically because there was no ischemia in the LAD territory denies chest pain or shortness of breath.  He does remain on dual antiplatelet therapy but had gross hematuria recently thought to be related to his prostate.  This has since stopped.  I am going to discontinue the Plavix since it has been 2 years since his intervention and replace it with Eliquis because of his newly recognized A. fib for stroke prophylaxis.

## 2017-10-02 NOTE — Assessment & Plan Note (Signed)
History of essential hypertension her blood pressure measured at 146/88.  He is on amlodipine [place] I am going to change his amlodipine to low-dose Cardizem CD for rate control

## 2017-10-02 NOTE — Progress Notes (Signed)
10/02/2017 Dillon Young   Feb 27, 1933  [id number]  Primary Physician Dillon Lima, MD Primary Cardiologist: Dillon Harp MD Dillon Young, [name]  HPI:  Dillon Young is a 84 y.o.  married Caucasian male father of 2 children, grandfather to 2 grandchildren he was formally a patient of Dillon Young. 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 He works out 5 days a week and has joined [employer]. He has a history of coronary artery disease status post angioplasty in 1994 and 1995 Of his circumflex coronary artery. He has not required cardiac catheterization since stress Myoview was performed in 2011 that was low risk and nonischemic. He has a history of hypertension and hyperlipidemia on medication. He had right carotid endarterectomy performed by Dillon Young Early June 2013 followed by duplex ultrasound. He denies chest pain or shortness of breath. His major complaint is of cramping in his thighs which may be related to his statin drug. He was given a statin holiday which really did not impact his symptoms and therefore statin was restarted and follow-up lipid profile performed 07/05/14 revealed a total cholesterol 131, LDL 58 and HDL of 44. Recent carotid Dopplers revealed his endarterectomy site was widely patent with moderate left ICA stenosis and moderate left subclavian artery stenosis without symptoms. He developed symptoms in August of this year and saw Dillon Young. A Myoview stress test performed 01/09/16 was notable for ischemia in the RCA distribution. Based on this he underwent cardiac catheterization by Dillon Young 01/16/16 revealing a 90% segmental mid dominant RCA stenosis which was stented with a Synergy 3.5 mm x 20 mm long drug-eluting stent. He did have a very small ramus branch that was 90% stenosed and not suitable for intervention as well as a 70% mid LAD lesion  although he had no evidence of anterior ischemia on his stress test. His symptoms of chest pain have resolved.  Since I saw him this months ago he is remained stable.  He denies chest pain or shortness of breath.  Does have mild ankle edema.  He is in newly recognized atrial fibrillation today.  He does relate a recent episode of hematuria which lasted for 10 days thought to be related related to his prostate and remote prostate cancer status post radioactive seed implantation.  He did see Dillon Young , his urologist for evaluation and stopped his aspirin briefly with resolution of his hematuria.  He has restarted dual anti-platelet therapy..    Current Meds  Medication Sig  . albuterol (PROVENTIL HFA;VENTOLIN HFA) 108 (90 Base) MCG/ACT inhaler Inhale 2 puffs into the lungs every 6 (six) hours as needed for wheezing or shortness of breath.  Marland Kitchen aspirin EC 81 MG tablet Take 81 mg by mouth every evening.   Marland Kitchen azelastine (ASTELIN) 0.1 % nasal spray Place 1 spray into both nostrils 2 (two) times daily. Use in each nostril as directed  . cholecalciferol (VITAMIN D) 1000 UNITS tablet Take 1,000 Units by mouth daily.    . diclofenac sodium (VOLTAREN) 1 % GEL Apply 2 g topically 4 (four) times daily.  Marland Kitchen dicyclomine (BENTYL) 20 MG tablet TAKE 1 TABLET (20 MG TOTAL) BY MOUTH EVERY 6 (SIX) HOURS AS NEEDED FOR SPASMS.  . Doxepin HCl (SILENOR) 3 MG TABS Take 1 tablet (3 mg total) by mouth at bedtime as needed (sleep).  Marland Kitchen  fluticasone (FLONASE) 50 MCG/ACT nasal spray Place 1-2 sprays into both nostrils daily as needed for allergies.  Marland Kitchen guaiFENesin-codeine (ROBITUSSIN AC) 100-10 MG/5ML syrup Take 5 mLs by mouth 3 (three) times daily as needed for cough.  . hydrocortisone (ANUSOL-HC) 2.5 % rectal cream Place 1 application rectally 2 (two) times daily as needed for hemorrhoids.   . irbesartan-hydrochlorothiazide (AVALIDE) 300-12.5 MG tablet Take 1 tablet by mouth daily.  . Melatonin 5 MG CAPS Take 1 capsule (5 mg total)  by mouth daily as needed (sleep).  . nitroGLYCERIN (NITROSTAT) 0.4 MG SL tablet Place 1 tablet (0.4 mg total) under the tongue every 5 (five) minutes as needed for chest pain.  . polyethylene glycol powder (GLYCOLAX/MIRALAX) powder Take 17 g by mouth as needed. (Patient taking differently: Take 17 g by mouth daily as needed for mild constipation. )  . rosuvastatin (CRESTOR) 10 MG tablet TAKE 1 TABLET BY MOUTH EVERY DAY  . vitamin C (ASCORBIC ACID) 500 MG tablet Take 500 mg by mouth daily.   . [DISCONTINUED] amLODipine (NORVASC) 5 MG tablet Take 1 tablet (5 mg total) by mouth daily.  . [DISCONTINUED] clopidogrel (PLAVIX) 75 MG tablet Take 1 tablet (75 mg total) daily by mouth.  . [DISCONTINUED] irbesartan-hydrochlorothiazide (AVALIDE) 300-12.5 MG tablet Take 1 tablet daily by mouth. KEEP OV.     Allergies  Allergen Reactions  . Lipitor [Atorvastatin] Other (See Comments)    myalgias    Social History   Socioeconomic History  . Marital status: Married    Spouse name: Not on file  . Number of children: 2  . Years of education: Not on file  . Highest education level: Not on file  Occupational History  . Occupation:  Academic librarian auction  Social Needs  . Financial resource strain: Not on file  . Food insecurity:    Worry: Not on file    Inability: Not on file  . Transportation needs:    Medical: Not on file    Non-medical: Not on file  Tobacco Use  . Smoking status: Former Smoker    Packs/day: 1.50    Years: 60.00    Pack years: 90.00    Types: Cigarettes    Last attempt to quit: 07/25/2008    Years since quitting: 9.1  . Smokeless tobacco: Never Used  Substance and Sexual Activity  . Alcohol use: Yes    Alcohol/week: 0.0 oz    Comment: Occ  . Drug use: No  . Sexual activity: Not on file  Lifestyle  . Physical activity:    Days per week: Not on file    Minutes per session: Not on file  . Stress: Not on file  Relationships  . Social connections:    Talks on phone:  Not on file    Gets together: Not on file    Attends religious service: Not on file    Active member of club or organization: Not on file    Attends meetings of clubs or organizations: Not on file    Relationship status: Not on file  . Intimate partner violence:    Fear of current or ex partner: Not on file    Emotionally abused: Not on file    Physically abused: Not on file    Forced sexual activity: Not on file  Other Topics Concern  . Not on file  Social History Narrative  . Not on file     Review of Systems: General: negative for chills, fever, night sweats or weight  changes.  Cardiovascular: negative for chest pain, dyspnea on exertion, edema, orthopnea, palpitations, paroxysmal nocturnal dyspnea or shortness of breath Dermatological: negative for rash Respiratory: negative for cough or wheezing Urologic: negative for hematuria Abdominal: negative for nausea, vomiting, diarrhea, bright red blood per rectum, melena, or hematemesis Neurologic: negative for visual changes, syncope, or dizziness All other systems reviewed and are otherwise negative except as noted above.    Blood pressure (!) 146/88, pulse 93, height 5\' 9"  (1.753 m), weight 186 lb (84.4 kg).  General appearance: alert and no distress Neck: no adenopathy, no carotid bruit, no JVD, supple, symmetrical, trachea midline and thyroid not enlarged, symmetric, no tenderness/mass/nodules Lungs: clear to auscultation bilaterally Heart: irregularly irregular rhythm Extremities: extremities normal, atraumatic, no cyanosis or edema Pulses: 2+ and symmetric Skin: Skin color, texture, turgor normal. No rashes or lesions Neurologic: Alert and oriented X 3, normal strength and tone. Normal symmetric reflexes. Normal coordination and gait  EKG atrial fibrillation with a ventricular response of 93 occasional aberrantly conducted beats.  There was low limb voltage as well.  I personally reviewed this EKG.  ASSESSMENT AND PLAN:     Hyperlipidemia with target LDL less than 70 History of hyperlipidemia on rosuvastatin profile performed 07/27/2016 revealing total cholesterol 119, LDL 52 HDL 42.  Recheck profile today  Essential hypertension History of essential hypertension her blood pressure measured at 146/88.  He is on amlodipine Avalide I am going to change his amlodipine to low-dose Cardizem CD for rate control  CAD S/P multiple PCIs History of CAD status post coronary intervention in 1994 1995 of the circumflex coronary artery.  He had cardiac catheterization performed by Dillon Young 01/16/2016 after a Myoview showed ischemia in the RCA territory.  He had a high-grade mid dominant RCA stenosis that underwent stenting by Dillon Young with a synergy 3.5 mm x 20 mm long drug-eluting stent.  He did have a small ramus branch that was 90% stenosed not suitable for intervention a 70% mid LAD lesion that was treated medically because there was no ischemia in the LAD territory denies chest pain or shortness of breath.  He does remain on dual antiplatelet therapy but had gross hematuria recently thought to be related to his prostate.  This has since stopped.  I am going to discontinue the Plavix since it has been 2 years since his intervention and replace it with Eliquis because of his newly recognized A. fib for stroke prophylaxis.  Carotid artery disease- s/p RCE June 2013 History of carotid artery disease status post right carotid endarterectomy performed by Dr. Curt Jews June 2013.  Performed 06/13/2017 reveal moderate left ICA stenosis which will be repeated on annual basis.  Persistent atrial fibrillation [facility]) Mr. Farooq EKG today shows atrial fibrillation with a ventricular response in the 90s.  His last EKG performed a year ago was sinus rhythm.  He is unaware that he is in A. fib today.The CHA2DSVASC2 score is  5 .  I am going to discontinue his Plavix since his been 2 years since his stent in place him on Eliquis for stroke  prophylaxis.  We will monitor to see if he has recurrent hematuria.      Dillon Harp MD FACP,FACC,FAHA, [facility] 10/02/2017 9:27 AM

## 2017-10-02 NOTE — Assessment & Plan Note (Signed)
History of carotid artery disease status post right carotid endarterectomy performed by Dr. Curt Jews June 2013.  Performed 06/13/2017 reveal moderate left ICA stenosis which will be repeated on annual basis.

## 2017-10-02 NOTE — Assessment & Plan Note (Signed)
Mr. Dillon Young EKG today shows atrial fibrillation with a ventricular response in the 90s.  His last EKG performed a year ago was sinus rhythm.  He is unaware that he is in A. fib today.The CHA2DSVASC2 score is  5 .  I am going to discontinue his Plavix since his been 2 years since his stent in place him on Eliquis for stroke prophylaxis.  We will monitor to see if he has recurrent hematuria.

## 2017-10-02 NOTE — Addendum Note (Signed)
Addended by: Therisa Doyne on: 10/02/2017 10:36 AM   Modules accepted: Orders

## 2017-10-02 NOTE — Assessment & Plan Note (Signed)
History of hyperlipidemia on rosuvastatin profile performed 07/27/2016 revealing total cholesterol 119, LDL 52 HDL 42.  Recheck profile today

## 2017-10-07 ENCOUNTER — Other Ambulatory Visit: Payer: Self-pay

## 2017-10-07 ENCOUNTER — Ambulatory Visit (HOSPITAL_BASED_OUTPATIENT_CLINIC_OR_DEPARTMENT_OTHER): Payer: PPO

## 2017-10-07 ENCOUNTER — Ambulatory Visit (HOSPITAL_COMMUNITY)
Admission: RE | Admit: 2017-10-07 | Discharge: 2017-10-07 | Disposition: A | Discharge status: Home / Self Care | Payer: PPO | Source: Ambulatory Visit | Attending: Cardiovascular Disease | Admitting: Cardiovascular Disease

## 2017-10-07 DIAGNOSIS — I4891 Unspecified atrial fibrillation: Secondary | ICD-10-CM | POA: Diagnosis not present

## 2017-10-07 DIAGNOSIS — I2510 Atherosclerotic heart disease of native coronary artery without angina pectoris: Secondary | ICD-10-CM | POA: Insufficient documentation

## 2017-10-07 DIAGNOSIS — I119 Hypertensive heart disease without heart failure: Secondary | ICD-10-CM | POA: Insufficient documentation

## 2017-10-07 DIAGNOSIS — Z87891 Personal history of nicotine dependence: Secondary | ICD-10-CM | POA: Insufficient documentation

## 2017-10-07 DIAGNOSIS — I714 Abdominal aortic aneurysm, without rupture, unspecified: Secondary | ICD-10-CM

## 2017-10-07 DIAGNOSIS — I708 Atherosclerosis of other arteries: Secondary | ICD-10-CM | POA: Diagnosis not present

## 2017-10-07 DIAGNOSIS — E785 Hyperlipidemia, unspecified: Secondary | ICD-10-CM | POA: Diagnosis not present

## 2017-10-07 DIAGNOSIS — I700 Atherosclerosis of aorta: Secondary | ICD-10-CM | POA: Diagnosis not present

## 2017-10-07 DIAGNOSIS — R29898 Other symptoms and signs involving the musculoskeletal system: Secondary | ICD-10-CM | POA: Diagnosis not present

## 2017-10-07 MED ORDER — PERFLUTREN LIPID MICROSPHERE
1.0000 mL | INTRAVENOUS | Status: AC | PRN
  Administered 2017-10-07: 2 mL via INTRAVENOUS

## 2017-10-08 ENCOUNTER — Telehealth: Payer: Self-pay | Admitting: *Deleted

## 2017-10-08 NOTE — Telephone Encounter (Addendum)
Left message for pt to call   ----- Message from [name], MD sent at 10/08/2017  1:44 PM EDT ----- Return office visit with me to discuss results at next available

## 2017-10-10 ENCOUNTER — Telehealth: Payer: Self-pay | Admitting: Cardiovascular Disease

## 2017-10-10 ENCOUNTER — Encounter: Payer: Self-pay | Admitting: Cardiovascular Disease

## 2017-10-10 ENCOUNTER — Encounter: Payer: Self-pay | Admitting: *Deleted

## 2017-10-10 MED ORDER — APIXABAN 5 MG PO TABS: 5.0000 mg | ORAL_TABLET | Freq: Two times a day (BID) | ORAL | Status: DC

## 2017-10-10 NOTE — Telephone Encounter (Signed)
New Message:        *STAT* If patient is at the pharmacy, call can be transferred to refill team.   1. Which medications need to be refilled? (please list name of each medication and dose if known) Eliquis 5mg   2. Which pharmacy/location (including street and city if local pharmacy) is medication to be sent to?[id number] - [place], [place] - [address]  3. Do they need a 30 day or 90 day supply? 30    Pt state that he was given samples of Eliquis and then a prescription was supposed to be called in for him

## 2017-10-10 NOTE — Telephone Encounter (Signed)
Spoke with pt, aware of echo results. Follow up scheduled for when event monitor will be complete as well.

## 2017-10-10 NOTE — Telephone Encounter (Signed)
Per previous tele note:[place], Casimiro Needle, RPH-CPP  10/02/17 12:17 PM  Note  Patient was in office to see Dr. [name] today.  New onset AF, will d/c clopidogrel but continue ASA.    Pt was started on Eliquis for AF today.    Reviewed patients medication list.  Pt is not currently on any combined P-gp and strong CYP3A4 inhibitors/inducers (ketoconazole, traconazole, ritonavir, carbamazepine, phenytoin, rifampin, St. John's wort).  Reviewed labs. Weight 84.4 kg, age 84.  Patient has not had recent BMET, SCr not available.  Patient will get labs today to verify dose.    A full discussion of the nature of anticoagulants has been carried out.  A benefit/risk analysis has been presented to the patient, so that they understand the justification for choosing anticoagulation with Eliquis at this time.  The need for compliance is stressed.  Pt is aware to take the medication twice daily.  Side effects of potential bleeding are discussed, including unusual colored urine or stools, coughing up blood or coffee ground emesis, nose bleeds or serious fall or head trauma.  Discussed signs and symptoms of stroke. The patient should avoid any OTC items containing aspirin or ibuprofen.  Avoid alcohol consumption.   Call if any signs of abnormal bleeding.  Discussed financial obligations and resolved any difficulty in obtaining medication.  Next lab test in 6 months.

## 2017-10-10 NOTE — Telephone Encounter (Signed)
This encounter was created in error - please disregard.

## 2017-10-11 ENCOUNTER — Other Ambulatory Visit: Payer: Self-pay | Admitting: Cardiovascular Disease

## 2017-10-14 ENCOUNTER — Encounter (HOSPITAL_COMMUNITY): Payer: PPO

## 2017-10-16 ENCOUNTER — Ambulatory Visit (INDEPENDENT_AMBULATORY_CARE_PROVIDER_SITE_OTHER): Payer: PPO

## 2017-10-16 ENCOUNTER — Telehealth: Payer: Self-pay | Admitting: Cardiovascular Disease

## 2017-10-16 DIAGNOSIS — I4891 Unspecified atrial fibrillation: Secondary | ICD-10-CM | POA: Diagnosis not present

## 2017-10-16 NOTE — Telephone Encounter (Signed)
New message   [employer] calling with EKG report

## 2017-10-16 NOTE — Telephone Encounter (Signed)
Spoke with rep.Kathlee Nations- for [employer]  Monitor strips from 8:25 am central time showed a rhythm of atrial afib Rate 80 . Patient had event monitor order for atrial fib burden, patient had EKG at that visit with Dr [name]. STRIPS BEING FAXED - WILL BE PLACE IN  DR BERRY 'S BIN TO BE REVIEWED

## 2017-10-23 NOTE — Telephone Encounter (Signed)
Monitor was placed for atrial fib burden, patient is on anticoagulation. Will continue to monitor.

## 2017-11-06 ENCOUNTER — Ambulatory Visit: Payer: PPO | Admitting: Cardiovascular Disease

## 2017-11-06 ENCOUNTER — Encounter: Payer: Self-pay | Admitting: Cardiovascular Disease

## 2017-11-06 VITALS — BP 144/78 | HR 63 | Ht 69.0 in | Wt 185.0 lb

## 2017-11-06 DIAGNOSIS — I519 Heart disease, unspecified: Secondary | ICD-10-CM | POA: Diagnosis not present

## 2017-11-06 DIAGNOSIS — I10 Essential (primary) hypertension: Secondary | ICD-10-CM | POA: Diagnosis not present

## 2017-11-06 DIAGNOSIS — I481 Persistent atrial fibrillation: Secondary | ICD-10-CM

## 2017-11-06 DIAGNOSIS — Z9861 Coronary angioplasty status: Secondary | ICD-10-CM | POA: Diagnosis not present

## 2017-11-06 DIAGNOSIS — I2510 Atherosclerotic heart disease of native coronary artery without angina pectoris: Secondary | ICD-10-CM | POA: Diagnosis not present

## 2017-11-06 DIAGNOSIS — I509 Heart failure, unspecified: Secondary | ICD-10-CM | POA: Diagnosis not present

## 2017-11-06 DIAGNOSIS — I4819 Other persistent atrial fibrillation: Secondary | ICD-10-CM

## 2017-11-06 MED ORDER — CARVEDILOL 6.25 MG PO TABS: 6.2500 mg | ORAL_TABLET | Freq: Two times a day (BID) | ORAL | 1 refills | Status: DC

## 2017-11-06 NOTE — Assessment & Plan Note (Signed)
Recent 2D echo performed 10/07/2017 revealed EF of 30 to 35%.  This represents a difficult drop from his prior EF of 50 to 55% 2 years previously.  This may be related to his A. fib which is the only new thing is being treated over the last year.  He may benefit from restoration of sinus rhythm.  I am going to refer him to the advanced heart failure clinic for optimization of his pharmacology.  I am changing his diltiazem to low-dose carvedilol which can be titrated.

## 2017-11-06 NOTE — Assessment & Plan Note (Signed)
History of essential hypertension with blood pressure measured today at 144/78.  He is on diltiazem, and Avalide.  I am going to stop his diltiazem and add carvedilol 6.25 mg p.o. twice daily.

## 2017-11-06 NOTE — Patient Instructions (Signed)
Medication Instructions: Your physician recommends that you continue on your current medications as directed. Please refer to the Current Medication list given to you today.  Stop Diltiazem  START Carvedilol 6.25 mg twice daily.  Follow-Up: You have been referred to the AFIB Clinic and the Heart Failure Clinic. Contact information and addresses are listed below.   We request that you follow-up in: 3 months with an extender and in 6 months with Dr Andria Rhein will receive a reminder letter in the mail two months in advance. If you don't receive a letter, please call our office to schedule the follow-up appointment.  If you need a refill on your cardiac medications before your next appointment, please call your pharmacy.

## 2017-11-06 NOTE — Assessment & Plan Note (Signed)
History of multiple PCI and stent procedures in the past most recently performed by Dr. [name] 01/16/2016.  He had a stent placed in his RCA.  He did have a small ramus branch stenosis probably not suitable for intervention and a moderate proximal to mid LAD lesion which was not thought to be hemodynamically significant.  He currently denies chest pain.  His last Myoview was performed in 2017 prior to his cath.

## 2017-11-06 NOTE — Assessment & Plan Note (Signed)
History of persistent atrial fibrillation rate controlled on Eliquis oral antiregulation.  His EF by recent echo was in the 30 to 35% range, reduced from 50 to 55% 2 years ago which I suspect is related to his A. fib.  I am going to refer him to the A. fib clinic for consideration of restoration of sinus rhythm.

## 2017-11-06 NOTE — Progress Notes (Signed)
11/06/2017 Dillon Young   04-21-1933  [id number]  Primary Physician [name], MD Primary Cardiologist: [name] MD [name], [name]  HPI:  Dillon Young is a 84 y.o.  married Caucasian male father of 2 children, grandfather to 2 grandchildren he was formally a patient of Dr. [name]. I last saw him in the office  10/02/2017. 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 He works out 5 days a week and has joined [employer].He has a history of coronary artery disease status post angioplasty in 1994 and 1995 Of his circumflex coronary artery. He has not required cardiac catheterization since stress Myoview was performed in 2011 that was low risk and nonischemic. He has a history of hypertension and hyperlipidemia on medication. He had right carotid endarterectomy performed by Dr. [name] Early June 2013 followed by duplex ultrasound. He denies chest pain or shortness of breath. His major complaint is of cramping in his thighs which may be related to his statin drug. He was given a statin holiday which really did not impact his symptoms and therefore statin was restarted and follow-up lipid profile performed 07/05/14 revealed a total cholesterol 131, LDL 58 and HDL of 44. Recent carotid Dopplers revealed his endarterectomy site was widely patent with moderate left ICA stenosis and moderate left subclavian artery stenosis without symptoms. He developed symptoms in August of this year and saw [name]. A Myoview stress test performed 01/09/16 was notable for ischemia in the RCA distribution. Based on this he underwent cardiac catheterization by Dr. Varanasi8/21/17 revealing a 90% segmental mid dominant RCA stenosis which was stented with a Synergy 3.5 mm x 20 mm long drug-eluting stent. He did have a very small ramus branch that was 90% stenosed and not suitable for intervention as well as a 70% mid LAD  lesion although he had no evidence of anterior ischemia on his stress test. His symptoms of chest pain have resolved. Since I saw him this months ago he is remained stable.  He denies chest pain or shortness of breath.  Does have mild ankle edema.  He is in newly recognized atrial fibrillation today.  He does relate a recent episode of hematuria which lasted for 10 days thought to be related related to his prostate and remote prostate cancer status post radioactive seed implantation.  He did see Dr. [name] , his urologist for evaluation and stopped his aspirin briefly with resolution of his hematuria.  He has restarted dual anti-platelet therapy.  Since I saw him in the office a month ago he has had a 2D echo that revealed decline in his LV function from 50 to 55% by his last 2D echo 2 years ago to 30 to 35%.  This may be related to his atrial fibrillation.  He feels weak and fatigued with some shortness of breath as well.      Current Meds  Medication Sig  . albuterol (PROVENTIL HFA;VENTOLIN HFA) 108 (90 Base) MCG/ACT inhaler Inhale 2 puffs into the lungs every 6 (six) hours as needed for wheezing or shortness of breath.  Marland Kitchen apixaban (ELIQUIS) 5 MG TABS tablet Take 1 tablet (5 mg total) by mouth 2 (two) times daily.  Marland Kitchen aspirin EC 81 MG tablet Take 81 mg by mouth every evening.   Marland Kitchen azelastine (ASTELIN) 0.1 % nasal spray Place 1 spray into both nostrils 2 (two) times daily. Use in  each nostril as directed  . cholecalciferol (VITAMIN D) 1000 UNITS tablet Take 1,000 Units by mouth daily.    . diclofenac sodium (VOLTAREN) 1 % GEL Apply 2 g topically 4 (four) times daily.  Marland Kitchen dicyclomine (BENTYL) 20 MG tablet TAKE 1 TABLET (20 MG TOTAL) BY MOUTH EVERY 6 (SIX) HOURS AS NEEDED FOR SPASMS.  . Doxepin HCl (SILENOR) 3 MG TABS Take 1 tablet (3 mg total) by mouth at bedtime as needed (sleep).  . fluticasone (FLONASE) 50 MCG/ACT nasal spray Place 1-2 sprays into both nostrils daily as needed for allergies.  Marland Kitchen  guaiFENesin-codeine (ROBITUSSIN AC) 100-10 MG/5ML syrup Take 5 mLs by mouth 3 (three) times daily as needed for cough.  . hydrocortisone (ANUSOL-HC) 2.5 % rectal cream Place 1 application rectally 2 (two) times daily as needed for hemorrhoids.   . irbesartan-hydrochlorothiazide (AVALIDE) 300-12.5 MG tablet Take 1 tablet by mouth daily.  . Melatonin 5 MG CAPS Take 1 capsule (5 mg total) by mouth daily as needed (sleep).  . nitroGLYCERIN (NITROSTAT) 0.4 MG SL tablet Place 1 tablet (0.4 mg total) under the tongue every 5 (five) minutes as needed for chest pain.  . rosuvastatin (CRESTOR) 10 MG tablet TAKE 1 TABLET BY MOUTH EVERY DAY  . vitamin C (ASCORBIC ACID) 500 MG tablet Take 500 mg by mouth daily.   . Wheat Dextrin (BENEFIBER PO) Take by mouth daily.  . [DISCONTINUED] diltiazem (CARDIZEM CD) 120 MG 24 hr capsule Take 1 capsule (120 mg total) by mouth daily.     Allergies  Allergen Reactions  . Lipitor [Atorvastatin] Other (See Comments)    myalgias    Social History   Socioeconomic History  . Marital status: Married    Spouse name: Not on file  . Number of children: 2  . Years of education: Not on file  . Highest education level: Not on file  Occupational History  . 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 Financial resource strain: Not on file  . Food insecurity:    Worry: Not on file    Inability: Not on file  . Transportation needs:    Medical: Not on file    Non-medical: Not on file  Tobacco Use  . Smoking status: Former Smoker    Packs/day: 1.50    Years: 60.00    Pack years: 90.00    Types: Cigarettes    Last attempt to quit: 07/25/2008    Years since quitting: 9.2  . Smokeless tobacco: Never Used  Substance and Sexual Activity  . Alcohol use: Yes    Alcohol/week: 0.0 oz    Comment: Occ  . Drug use: No  . Sexual activity: Not on file  Lifestyle  . Physical activity:    Days per week: Not on file    Minutes per session: Not on file  . Stress: Not  on file  Relationships  . Social connections:    Talks on phone: Not on file    Gets together: Not on file    Attends religious service: Not on file    Active member of club or organization: Not on file    Attends meetings of clubs or organizations: Not on file    Relationship status: Not on file  . Intimate partner violence:    Fear of current or ex partner: Not on file    Emotionally abused: Not on file    Physically abused: Not on file    Forced sexual activity: Not on file  Other Topics Concern  . Not on file  Social History Narrative  . Not on file     Review of Systems: General: negative for chills, fever, night sweats or weight changes.  Cardiovascular: negative for chest pain, dyspnea on exertion, edema, orthopnea, palpitations, paroxysmal nocturnal dyspnea or shortness of breath Dermatological: negative for rash Respiratory: negative for cough or wheezing Urologic: negative for hematuria Abdominal: negative for nausea, vomiting, diarrhea, bright red blood per rectum, melena, or hematemesis Neurologic: negative for visual changes, syncope, or dizziness All other systems reviewed and are otherwise negative except as noted above.    Blood pressure (!) 144/78, pulse 63, height 5\' 9"  (1.753 m), weight 185 lb (83.9 kg).  General appearance: alert and no distress Neck: no adenopathy, no carotid bruit, no JVD, supple, symmetrical, trachea midline and thyroid not enlarged, symmetric, no tenderness/mass/nodules Lungs: clear to auscultation bilaterally Heart: irregularly irregular rhythm Extremities: extremities normal, atraumatic, no cyanosis or edema Pulses: 2+ and symmetric Skin: Skin color, texture, turgor normal. No rashes or lesions Neurologic: Alert and oriented X 3, normal strength and tone. Normal symmetric reflexes. Normal coordination and gait  EKG not performed today  ASSESSMENT AND PLAN:   Persistent atrial fibrillation ([place]) History of persistent atrial  fibrillation rate controlled on Eliquis oral antiregulation.  His EF by recent echo was in the 30 to 35% range, reduced from 50 to 55% 2 years ago which I suspect is related to his A. fib.  I am going to refer him to the A. fib clinic for consideration of restoration of sinus rhythm.  Essential hypertension History of essential hypertension with blood pressure measured today at 144/78.  He is on diltiazem, and Avalide.  I am going to stop his diltiazem and add carvedilol 6.25 mg p.o. twice daily.  CAD S/P multiple PCIs History of multiple PCI and stent procedures in the past most recently performed by Dr. [name] 01/16/2016.  He had a stent placed in his RCA.  He did have a small ramus branch stenosis probably not suitable for intervention and a moderate proximal to mid LAD lesion which was not thought to be hemodynamically significant.  He currently denies chest pain.  His last Myoview was performed in 2017 prior to his cath.  Left ventricular dysfunction Recent 2D echo performed 10/07/2017 revealed EF of 30 to 35%.  This represents a difficult drop from his prior EF of 50 to 55% 2 years previously.  This may be related to his A. fib which is the only new thing is being treated over the last year.  He may benefit from restoration of sinus rhythm.  I am going to refer him to the advanced heart failure clinic for optimization of his pharmacology.  I am changing his diltiazem to low-dose carvedilol which can be titrated.      [name] MD FACP,FACC,FAHA, [facility] 11/06/2017 9:22 AM

## 2017-11-12 ENCOUNTER — Telehealth: Payer: Self-pay | Admitting: Emergency Medicine

## 2017-11-12 NOTE — Telephone Encounter (Signed)
Called patient to schedule AWV. Pt declined at this time. 

## 2017-11-20 ENCOUNTER — Ambulatory Visit (HOSPITAL_COMMUNITY)
Admission: RE | Admit: 2017-11-20 | Discharge: 2017-11-20 | Disposition: A | Discharge status: Home / Self Care | Payer: PPO | Source: Ambulatory Visit | Attending: Nurse Practitioner | Admitting: Nurse Practitioner

## 2017-11-20 ENCOUNTER — Encounter (HOSPITAL_COMMUNITY): Payer: Self-pay | Admitting: Nurse Practitioner

## 2017-11-20 VITALS — BP 148/84 | HR 80 | Ht 69.0 in | Wt 185.0 lb

## 2017-11-20 DIAGNOSIS — I2510 Atherosclerotic heart disease of native coronary artery without angina pectoris: Secondary | ICD-10-CM | POA: Insufficient documentation

## 2017-11-20 DIAGNOSIS — E1122 Type 2 diabetes mellitus with diabetic chronic kidney disease: Secondary | ICD-10-CM | POA: Diagnosis not present

## 2017-11-20 DIAGNOSIS — Z8673 Personal history of transient ischemic attack (TIA), and cerebral infarction without residual deficits: Secondary | ICD-10-CM | POA: Insufficient documentation

## 2017-11-20 DIAGNOSIS — I481 Persistent atrial fibrillation: Secondary | ICD-10-CM

## 2017-11-20 DIAGNOSIS — K589 Irritable bowel syndrome without diarrhea: Secondary | ICD-10-CM | POA: Insufficient documentation

## 2017-11-20 DIAGNOSIS — K219 Gastro-esophageal reflux disease without esophagitis: Secondary | ICD-10-CM | POA: Diagnosis not present

## 2017-11-20 DIAGNOSIS — N189 Chronic kidney disease, unspecified: Secondary | ICD-10-CM | POA: Diagnosis not present

## 2017-11-20 DIAGNOSIS — Z87891 Personal history of nicotine dependence: Secondary | ICD-10-CM | POA: Insufficient documentation

## 2017-11-20 DIAGNOSIS — Z7901 Long term (current) use of anticoagulants: Secondary | ICD-10-CM | POA: Diagnosis not present

## 2017-11-20 DIAGNOSIS — E785 Hyperlipidemia, unspecified: Secondary | ICD-10-CM | POA: Diagnosis not present

## 2017-11-20 DIAGNOSIS — Z7982 Long term (current) use of aspirin: Secondary | ICD-10-CM | POA: Insufficient documentation

## 2017-11-20 DIAGNOSIS — Z79899 Other long term (current) drug therapy: Secondary | ICD-10-CM | POA: Insufficient documentation

## 2017-11-20 DIAGNOSIS — I4819 Other persistent atrial fibrillation: Secondary | ICD-10-CM

## 2017-11-20 DIAGNOSIS — I509 Heart failure, unspecified: Secondary | ICD-10-CM | POA: Insufficient documentation

## 2017-11-20 DIAGNOSIS — G4730 Sleep apnea, unspecified: Secondary | ICD-10-CM | POA: Diagnosis not present

## 2017-11-20 DIAGNOSIS — Z8546 Personal history of malignant neoplasm of prostate: Secondary | ICD-10-CM | POA: Diagnosis not present

## 2017-11-20 DIAGNOSIS — I130 Hypertensive heart and chronic kidney disease with heart failure and stage 1 through stage 4 chronic kidney disease, or unspecified chronic kidney disease: Secondary | ICD-10-CM | POA: Diagnosis not present

## 2017-11-20 DIAGNOSIS — I4891 Unspecified atrial fibrillation: Secondary | ICD-10-CM | POA: Diagnosis present

## 2017-11-20 LAB — MAGNESIUM: Magnesium: 2.1 mg/dL (ref 1.7–2.4)

## 2017-11-20 LAB — BASIC METABOLIC PANEL WITH GFR
Anion gap: 6 (ref 5–15)
BUN: 20 mg/dL (ref 8–23)
CO2: 32 mmol/L (ref 22–32)
Calcium: 9.8 mg/dL (ref 8.9–10.3)
Chloride: 102 mmol/L (ref 98–111)
Creatinine, Ser: 1.37 mg/dL — ABNORMAL HIGH (ref 0.61–1.24)
GFR calc Af Amer: 53 mL/min — ABNORMAL LOW
GFR calc non Af Amer: 46 mL/min — ABNORMAL LOW
Glucose, Bld: 139 mg/dL — ABNORMAL HIGH (ref 70–99)
Potassium: 4.6 mmol/L (ref 3.5–5.1)
Sodium: 140 mmol/L (ref 135–145)

## 2017-11-20 LAB — CBC
HCT: 49 % (ref 39.0–52.0)
Hemoglobin: 15.7 g/dL (ref 13.0–17.0)
MCH: 27.6 pg (ref 26.0–34.0)
MCHC: 32 g/dL (ref 30.0–36.0)
MCV: 86.3 fL (ref 78.0–100.0)
PLATELETS: 176 10*3/uL (ref 150–400)
RBC: 5.68 MIL/uL (ref 4.22–5.81)
RDW: 13.7 % (ref 11.5–15.5)
WBC: 5.7 10*3/uL (ref 4.0–10.5)

## 2017-11-20 LAB — TSH: TSH: 2.346 u[IU]/mL (ref 0.350–4.500)

## 2017-11-20 MED ORDER — CARVEDILOL 6.25 MG PO TABS: 3.1250 mg | ORAL_TABLET | Freq: Two times a day (BID) | ORAL | 1 refills | Status: DC

## 2017-11-20 NOTE — Progress Notes (Signed)
Primary Care Physician: [name], MD Referring Physician:Dr. Natalio Salois is a 84 y.o. male with a h/o coronary artery disease status post angioplasty in 1994 and 1995of his circumflex coronary artery. . He has a history of hypertension and hyperlipidemia on medication. He had right carotid endarterectomy performed by Dr. Curt Jews June 2013.Myoview stress test performed 01/09/16 was notable for ischemia in the RCA distribution. Based on this he underwent cardiac catheterization by Dr. Varanasi8/21/17 revealing a 90% segmental mid dominant RCA stenosis which was stented with a Synergy 3.5 mm x 20 mm long drug-eluting stent. He did have a very small ramus branch that was 90% stenosed and not suitable for intervention as well as a 70% mid LAD lesion although he had no evidence of anterior ischemia on his stress test. His symptoms of chest pain have resolved.  He was diagnosed in May with newly onset  afib from which the pt is asymptomatic. Echo was done and showed EF decreased to 30-35%, normal EF by echo in August 2017. He has been on Eliquis 5 mg bid since early May without missed doses.  Plavix were stopped at that time, ASA continued and pt was started on carvedilol 6.25 mg bid.He states that he had a sleep study in the past but never slept enough with the study to get good results. He is afraid that he would never be able to tolerate a cpap. Sleeps in a recliner now, " I just feel more comfortable".  Today, he denies symptoms of palpitations, chest pain, shortness of breath, orthopnea, PND, lower extremity edema, dizziness, presyncope, syncope, or neurologic sequela. The patient is tolerating medications without difficulties and is otherwise without complaint today.   Past Medical History:  Diagnosis Date  . Adenomatous colon polyp   . Allergic rhinitis   . BPH (benign prostatic hyperplasia)   . CAD (coronary artery disease)    sees Dr. [name]   . Carotid artery  disease ([place])   . CHF (congestive heart failure) ([place])   . Chronic kidney disease    [place]. Prostate cancer  . Clotting disorder ([place])   . CVA (cerebral infarction)   . Diabetes mellitus    "Borderline"  . Diverticulitis   . GERD (gastroesophageal reflux disease)    history  . Hemorrhoids   . HTN (hypertension)   . Hyperlipidemia LDL goal <70   . IBS (irritable bowel syndrome)   . Low back pain   . Prostate cancer ([place]) 2008   seed implantation  . Sleep apnea    last study was aborted.  Has never worn a CPAP.    Marland Kitchen Smoker    Past Surgical History:  Procedure Laterality Date  . APPENDECTOMY    . CARDIAC CATHETERIZATION N/A 01/16/2016   Procedure: Left Heart Cath and Coronary Angiography;  Surgeon: Jettie Booze, MD;  Location: [facility];  Service: Cardiovascular;  Laterality: N/A;  . CARDIAC CATHETERIZATION N/A 01/16/2016   Procedure: Coronary Stent Intervention;  Surgeon: Jettie Booze, MD;  Location: [facility];  Service: Cardiovascular;  Laterality: N/A;  . Cataract with Lens     Bilateral  . COLONOSCOPY  10/21/2008   diverticulosis, radiation proctitis, internal hemorrhoids  . ENDARTERECTOMY  11/12/2011   Procedure: ENDARTERECTOMY CAROTID;  Surgeon: Rosetta Posner, MD;  Location: [facility] OR;  Service: Vascular;  Laterality: Right;  . Esophageal dilitation    . HEMORRHOID BANDING    . Lithrotripsy  x 3  . LUMBAR LAMINECTOMY    . TRANSURETHRAL RESECTION OF PROSTATE  10/2003    Current Outpatient Medications  Medication Sig Dispense Refill  . apixaban (ELIQUIS) 5 MG TABS tablet Take 1 tablet (5 mg total) by mouth 2 (two) times daily. 180 tablet 01  . aspirin EC 81 MG tablet Take 81 mg by mouth every evening.     Marland Kitchen azelastine (ASTELIN) 0.1 % nasal spray Place 1 spray into both nostrils 2 (two) times daily. Use in each nostril as directed (Patient taking differently: Place 1 spray into both nostrils daily as needed for allergies. ) 30 mL 11  .  carvedilol (COREG) 6.25 MG tablet Take 0.5 tablets (3.125 mg total) by mouth 2 (two) times daily. 180 tablet 1  . cholecalciferol (VITAMIN D) 1000 UNITS tablet Take 1,000 Units by mouth at bedtime.     . fluticasone (FLONASE) 50 MCG/ACT nasal spray Place 1-2 sprays into both nostrils daily as needed for allergies. 48 g 1  . guaiFENesin-codeine (ROBITUSSIN AC) 100-10 MG/5ML syrup Take 5 mLs by mouth 3 (three) times daily as needed for cough. (Patient not taking: Reported on 11/20/2017) 120 mL 0  . hydrocortisone (ANUSOL-HC) 2.5 % rectal cream Place 1 application rectally 2 (two) times daily as needed for hemorrhoids.     . irbesartan-hydrochlorothiazide (AVALIDE) 300-12.5 MG tablet Take 1 tablet by mouth daily. 90 tablet 3  . Melatonin 5 MG CAPS Take 1 capsule (5 mg total) by mouth daily as needed (sleep). (Patient taking differently: Take 1 capsule by mouth at bedtime as needed (sleep). )    . nitroGLYCERIN (NITROSTAT) 0.4 MG SL tablet Place 1 tablet (0.4 mg total) under the tongue every 5 (five) minutes as needed for chest pain. 25 tablet 3  . rosuvastatin (CRESTOR) 10 MG tablet TAKE 1 TABLET BY MOUTH EVERY DAY (Patient taking differently: TAKE 1 TABLET BY MOUTH EVERY DAY at bedtime.) 90 tablet 3  . vitamin C (ASCORBIC ACID) 500 MG tablet Take 500 mg by mouth at bedtime.     . Wheat Dextrin (BENEFIBER PO) Take 2 Scoops by mouth daily.     . Hypromellose (ARTIFICIAL TEARS OP) Place 1 drop into both eyes 4 (four) times daily as needed (dry eyes).     No current facility-administered medications for this encounter.     Allergies  Allergen Reactions  . Lipitor [Atorvastatin] Other (See Comments)    myalgias    Social History   Socioeconomic History  . Marital status: Married    Spouse name: Not on file  . Number of children: 2  . Years of education: Not on file  . Highest education level: Not on file  Occupational History  . 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 Financial  resource strain: Not on file  . Food insecurity:    Worry: Not on file    Inability: Not on file  . Transportation needs:    Medical: Not on file    Non-medical: Not on file  Tobacco Use  . Smoking status: Former Smoker    Packs/day: 1.50    Years: 60.00    Pack years: 90.00    Types: Cigarettes    Last attempt to quit: 07/25/2008    Years since quitting: 9.3  . Smokeless tobacco: Never Used  Substance and Sexual Activity  . Alcohol use: Yes    Alcohol/week: 0.0 oz    Comment: Occ  . Drug use: No  . Sexual activity: Not  on file  Lifestyle  . Physical activity:    Days per week: Not on file    Minutes per session: Not on file  . Stress: Not on file  Relationships  . Social connections:    Talks on phone: Not on file    Gets together: Not on file    Attends religious service: Not on file    Active member of club or organization: Not on file    Attends meetings of clubs or organizations: Not on file    Relationship status: Not on file  . Intimate partner violence:    Fear of current or ex partner: Not on file    Emotionally abused: Not on file    Physically abused: Not on file    Forced sexual activity: Not on file  Other Topics Concern  . Not on file  Social History Narrative  . Not on file    Family History  Problem Relation Age of Onset  . Heart disease Father   . Colon cancer Neg Hx   . Esophageal cancer Neg Hx   . Stomach cancer Neg Hx   . Rectal cancer Neg Hx   . Liver cancer Neg Hx     ROS- All systems are reviewed and negative except as per the HPI above  Physical Exam: Vitals:   11/20/17 0903  BP: (!) 148/84  Pulse: 80  Weight: 185 lb (83.9 kg)  Height: 5\' 9"  (1.753 m)   Wt Readings from Last 3 Encounters:  11/20/17 185 lb (83.9 kg)  11/06/17 185 lb (83.9 kg)  10/02/17 186 lb (84.4 kg)    Labs: Lab Results  Component Value Date   NA 140 11/20/2017   K 4.6 11/20/2017   CL 102 11/20/2017   CO2 32 11/20/2017   GLUCOSE 139 (H) 11/20/2017    BUN 20 11/20/2017   CREATININE 1.37 (H) 11/20/2017   CALCIUM 9.8 11/20/2017   MG 2.1 11/20/2017   Lab Results  Component Value Date   INR 1.06 01/15/2016   Lab Results  Component Value Date   CHOL 121 10/02/2017   HDL 46 10/02/2017   LDLCALC 50 10/02/2017   TRIG 127 10/02/2017     GEN- The patient is well appearing, alert and oriented x 3 today.   Head- normocephalic, atraumatic Eyes-  Sclera clear, conjunctiva pink Ears- hearing intact Oropharynx- clear Neck- supple, no JVP Lymph- no cervical lymphadenopathy Lungs- Clear to ausculation bilaterally, normal work of breathing Heart- irregular rate and rhythm, no murmurs, rubs or gallops, PMI not laterally displaced GI- soft, NT, ND, + BS Extremities- no clubbing, cyanosis, or edema MS- no significant deformity or atrophy Skin- no rash or lesion Psych- euthymic mood, full affect Neuro- strength and sensation are intact  EKG-afib at 80 bpm, qrs int 96 ms, qtc 438 ms Echo-Study Conclusions  - Left ventricle: The cavity size was severely dilated. Systolic   function was moderately to severely reduced. The estimated   ejection fraction was in the range of 30% to 35%. There is   hypokinesis of the lateral myocardium. There is akinesis of the   inferolateral and apical myocardium. The study was not   technically sufficient to allow evaluation of LV diastolic   dysfunction due to atrial fibrillation. - Aorta: Maximal diameter of abdominal aorta: 41 mm. - Abdominal aorta: The abdominal aorta was moderately dilated. - Tricuspid valve: There was trivial regurgitation. - Pulmonic valve: There was trivial regurgitation.    Assessment and Plan: 1.  New onset afib He is rate controlled He is asymptomatic but with reduced EF He has been on Eliquis since the beginning of May without missed doses SInce he appears to have had afib just a few months and with normal left atrial size, discussed with pt DCCV to see if he can restore  and maintain SR with cardioversion If not will have to discuss use of antiarrythmic's Discussed today, but pt is not excited re starting AAD therapy at this time He does have sleep apnea, as witnessed by wife, but has improved sleeping in a recliner, defers sleep study today In SR, EKG's show HR in the 60's before BB therapy was introduced Will have pt reduce coreg to 3.125 mg bid staring am of cardioversion until we see how  HR is running back in SR with BB on board  2. LV dysfunction No unusual shortness of breath Currently weight is stable Continue BB/ARB/HCTZ Pending establishing with Dr. [name] 8/12  F/u in afib clinic one week after cardioversion scheduled 7/2  Butch Penny C. Amberrose Friebel, [facility] [address] [place], [place] [address] [phone]

## 2017-11-20 NOTE — Patient Instructions (Signed)
Cardioversion scheduled for Tuesday, July 2nd  - Arrive at the [facility] and go to admitting at 11:30AM  -Do not eat or drink anything after midnight the night prior to your procedure.  - Take all your morning medications with a sip of water prior to arrival.  - You will not be able to drive home after your procedure.  On Morning of cardioversion you will start a decreased dose of coreg at 3.125mg  twice a day (half of your 6.25mg  tab twice a day) continue this until follow up at the 3.125mg  dose.

## 2017-11-26 ENCOUNTER — Ambulatory Visit (HOSPITAL_COMMUNITY): Payer: PPO | Admitting: Anesthesiology

## 2017-11-26 ENCOUNTER — Ambulatory Visit (HOSPITAL_COMMUNITY)
Admission: RE | Admit: 2017-11-26 | Discharge: 2017-11-26 | Disposition: A | Discharge status: Home / Self Care | Payer: PPO | Source: Ambulatory Visit | Attending: Internal Medicine | Admitting: Internal Medicine

## 2017-11-26 ENCOUNTER — Encounter (HOSPITAL_COMMUNITY): Payer: Self-pay | Admitting: *Deleted

## 2017-11-26 ENCOUNTER — Encounter (HOSPITAL_COMMUNITY)
Admission: RE | Disposition: A | Discharge status: Home / Self Care | Payer: Self-pay | Source: Ambulatory Visit | Attending: Internal Medicine

## 2017-11-26 DIAGNOSIS — Z87891 Personal history of nicotine dependence: Secondary | ICD-10-CM | POA: Insufficient documentation

## 2017-11-26 DIAGNOSIS — I481 Persistent atrial fibrillation: Secondary | ICD-10-CM

## 2017-11-26 DIAGNOSIS — I2510 Atherosclerotic heart disease of native coronary artery without angina pectoris: Secondary | ICD-10-CM | POA: Insufficient documentation

## 2017-11-26 DIAGNOSIS — I10 Essential (primary) hypertension: Secondary | ICD-10-CM | POA: Insufficient documentation

## 2017-11-26 DIAGNOSIS — Z955 Presence of coronary angioplasty implant and graft: Secondary | ICD-10-CM | POA: Insufficient documentation

## 2017-11-26 DIAGNOSIS — E119 Type 2 diabetes mellitus without complications: Secondary | ICD-10-CM | POA: Insufficient documentation

## 2017-11-26 DIAGNOSIS — I4891 Unspecified atrial fibrillation: Secondary | ICD-10-CM | POA: Diagnosis not present

## 2017-11-26 HISTORY — PX: CARDIOVERSION: SHX1299

## 2017-11-26 SURGERY — CARDIOVERSION
Anesthesia: General

## 2017-11-26 MED ORDER — LIDOCAINE 2% (20 MG/ML) 5 ML SYRINGE
INTRAMUSCULAR | Status: DC | PRN
  Administered 2017-11-26: 40 mg via INTRAVENOUS

## 2017-11-26 MED ORDER — SODIUM CHLORIDE 0.9 % IV SOLN
INTRAVENOUS | Status: DC
  Administered 2017-11-26: 12:00:00 via INTRAVENOUS

## 2017-11-26 MED ORDER — PROPOFOL 10 MG/ML IV BOLUS
INTRAVENOUS | Status: DC | PRN
  Administered 2017-11-26: 60 mg via INTRAVENOUS

## 2017-11-26 NOTE — Discharge Instructions (Signed)
Electrical Cardioversion, Care After This sheet gives you information about how to care for yourself after your procedure. Your health care provider may also give you more specific instructions. If you have problems or questions, contact your health care provider. What can I expect after the procedure? After the procedure, it is common to have:  Some redness on the skin where the shocks were given. Hydrocortisone cream can be applied if redness/irritation occurs.  Follow these instructions at home:  Do not drive for 24 hours if you were given a medicine to help you relax (sedative).  Take over-the-counter and prescription medicines only as told by your health care provider.  Ask your health care provider how to check your pulse. Check it often.  Rest for 48 hours after the procedure or as told by your health care provider.  Avoid or limit your caffeine use as told by your health care provider. Contact a health care provider if:  You feel like your heart is beating too quickly or your pulse is not regular.  You have a serious muscle cramp that does not go away. Get help right away if:  You have discomfort in your chest.  You are dizzy or you feel faint.  You have trouble breathing or you are short of breath.  Your speech is slurred.  You have trouble moving an arm or leg on one side of your body.  Your fingers or toes turn cold or blue. This information is not intended to replace advice given to you by your health care provider. Make sure you discuss any questions you have with your health care provider. Document Released: 03/04/2013 Document Revised: 12/16/2015 Document Reviewed: 11/18/2015 Elsevier Interactive Patient Education  [employer].

## 2017-11-26 NOTE — CV Procedure (Signed)
   CARDIOVERSION NOTE  Procedure: Electrical Cardioversion Indications:  Atrial Fibrillation  Procedure Details:  Consent: Risks of procedure as well as the alternatives and risks of each were explained to the (patient/caregiver).  Consent for procedure obtained.  Time Out: Verified patient identification, verified procedure, site/side was marked, verified correct patient position, special equipment/implants available, medications/allergies/relevent history reviewed, required imaging and test results available.  Performed  Patient placed on cardiac monitor, pulse oximetry, supplemental oxygen as necessary.  Sedation given: propofol per anesthesia Pacer pads placed anterior and posterior chest.  Cardioverted 2 time(s).  Cardioverted at 150J and 200J biphasic.  Impression: Findings: Post procedure EKG shows: NSR Complications: None Patient did tolerate procedure well.  Plan: 1. Successful DCCV to NSR with 2 stacked shocks. 2. Follow-up in A-fib clinic and HF clinic.  Time Spent Directly with the Patient:  30 minutes   Dillon Casino, Dillon Young, Dillon Young, Dillon Young Director of the Advanced Lipid Disorders &  Cardiovascular Risk Reduction Clinic Diplomate of the American Board of Clinical Lipidology Attending Cardiologist  Direct Dial: [phone]  Fax: [phone]  Website:  [email or web address] 11/26/2017, 12:57 PM

## 2017-11-26 NOTE — Anesthesia Procedure Notes (Signed)
Procedure Name: General with mask airway Performed by: Bona Hubbard B, CRNA Pre-anesthesia Checklist: Patient identified, Emergency Drugs available, Suction available, Patient being monitored and Timeout performed Patient Re-evaluated:Patient Re-evaluated prior to induction Oxygen Delivery Method: Ambu bag Preoxygenation: Pre-oxygenation with 100% oxygen Induction Type: IV induction Ventilation: Mask ventilation without difficulty Dental Injury: Teeth and Oropharynx as per pre-operative assessment        

## 2017-11-26 NOTE — Transfer of Care (Signed)
Immediate Anesthesia Transfer of Care Note  Patient: Dillon Young  Procedure(s) Performed: CARDIOVERSION (N/A )  Patient Location: Endoscopy Unit  Anesthesia Type:General  Level of Consciousness: awake, alert  and oriented  Airway & Oxygen Therapy: Patient Spontanous Breathing  Post-op Assessment: Report given to RN and Post -op Vital signs reviewed and stable  Post vital signs: Reviewed and stable  Last Vitals:  Vitals Value Taken Time  BP 144/66 11/26/2017 12:52 PM  Temp    Pulse 72 11/26/2017 12:52 PM  Resp 21 11/26/2017 12:52 PM  SpO2 97 % 11/26/2017 12:52 PM    Last Pain:  Vitals:   11/26/17 1122  TempSrc: Oral  PainSc: 0-No pain         Complications: No apparent anesthesia complications

## 2017-11-26 NOTE — Anesthesia Preprocedure Evaluation (Addendum)
Anesthesia Evaluation  Patient identified by MRN, date of birth, ID band Patient awake    Reviewed: Allergy & Precautions, NPO status , Patient's Chart, lab work & pertinent test results  Airway Mallampati: II  TM Distance: >3 FB Neck ROM: Full    Dental  (+) Teeth Intact, Dental Advisory Given   Pulmonary former smoker,    breath sounds clear to auscultation       Cardiovascular hypertension,  Rhythm:Irregular - Systolic murmurs    Neuro/Psych    GI/Hepatic   Endo/Other  diabetes  Renal/GU      Musculoskeletal   Abdominal   Peds  Hematology   Anesthesia Other Findings   Reproductive/Obstetrics                             Anesthesia Physical Anesthesia Plan  ASA: III  Anesthesia Plan: General   Post-op Pain Management:    Induction: Intravenous  PONV Risk Score and Plan:   Airway Management Planned: Mask  Additional Equipment:   Intra-op Plan:   Post-operative Plan:   Informed Consent: I have reviewed the patients History and Physical, chart, labs and discussed the procedure including the risks, benefits and alternatives for the proposed anesthesia with the patient or authorized representative who has indicated his/her understanding and acceptance.     Plan Discussed with: CRNA and Anesthesiologist  Anesthesia Plan Comments:         Anesthesia Quick Evaluation

## 2017-11-26 NOTE — H&P (Signed)
   INTERVAL PROCEDURE H&P  History and Physical Interval Note:  11/26/2017 12:23 PM  Dillon Young has presented today for their planned procedure. The various methods of treatment have been discussed with the patient and family. After consideration of risks, benefits and other options for treatment, the patient has consented to the procedure.  The patients' outpatient history has been reviewed, patient examined, and no change in status from most recent office note within the past 30 days. I have reviewed the patients' chart and labs and will proceed as planned. Questions were answered to the patient's satisfaction.   [name], MD, [facility], [place] Director of the [facility] Lipid Disorders &  Cardiovascular Risk Reduction Clinic Diplomate of the American Board of Clinical Lipidology Attending Cardiologist  Direct Dial: [phone]  Fax: [phone]  Website:  [email or web address] 11/26/2017, 12:23 PM

## 2017-11-26 NOTE — Anesthesia Postprocedure Evaluation (Signed)
Anesthesia Post Note  Patient: LORETTA KLUENDER  Procedure(s) Performed: CARDIOVERSION (N/A )     Patient location during evaluation: Endoscopy Anesthesia Type: General Level of consciousness: awake and alert Pain management: pain level controlled Vital Signs Assessment: post-procedure vital signs reviewed and stable Respiratory status: spontaneous breathing, nonlabored ventilation, respiratory function stable and patient connected to nasal cannula oxygen Cardiovascular status: blood pressure returned to baseline and stable Postop Assessment: no apparent nausea or vomiting Anesthetic complications: no    Last Vitals:  Vitals:   11/26/17 1304 11/26/17 1314  BP: (!) 154/71 (!) 141/74  Pulse: 73 90  Resp: 20 16  Temp:    SpO2: 98% 99%    Last Pain:  Vitals:   11/26/17 1314  TempSrc:   PainSc: 0-No pain                 Casy Brunetto COKER

## 2017-12-02 ENCOUNTER — Encounter: Payer: Self-pay | Admitting: Physician Assistant

## 2017-12-02 ENCOUNTER — Ambulatory Visit (INDEPENDENT_AMBULATORY_CARE_PROVIDER_SITE_OTHER): Payer: PPO | Admitting: Physician Assistant

## 2017-12-02 ENCOUNTER — Telehealth: Payer: Self-pay | Admitting: Cardiovascular Disease

## 2017-12-02 VITALS — BP 130/80 | HR 97 | Ht 69.0 in | Wt 182.0 lb

## 2017-12-02 DIAGNOSIS — I200 Unstable angina: Secondary | ICD-10-CM

## 2017-12-02 DIAGNOSIS — I2510 Atherosclerotic heart disease of native coronary artery without angina pectoris: Secondary | ICD-10-CM

## 2017-12-02 DIAGNOSIS — I5022 Chronic systolic (congestive) heart failure: Secondary | ICD-10-CM

## 2017-12-02 DIAGNOSIS — Z01818 Encounter for other preprocedural examination: Secondary | ICD-10-CM

## 2017-12-02 DIAGNOSIS — I481 Persistent atrial fibrillation: Secondary | ICD-10-CM | POA: Diagnosis not present

## 2017-12-02 DIAGNOSIS — Z7901 Long term (current) use of anticoagulants: Secondary | ICD-10-CM

## 2017-12-02 DIAGNOSIS — Z9861 Coronary angioplasty status: Secondary | ICD-10-CM | POA: Diagnosis not present

## 2017-12-02 DIAGNOSIS — I4819 Other persistent atrial fibrillation: Secondary | ICD-10-CM

## 2017-12-02 LAB — BASIC METABOLIC PANEL
BUN/Creatinine Ratio: 16 (ref 10–24)
BUN: 20 mg/dL (ref 8–27)
CHLORIDE: 99 mmol/L (ref 96–106)
CO2: 26 mmol/L (ref 20–29)
Calcium: 10.4 mg/dL — ABNORMAL HIGH (ref 8.6–10.2)
Creatinine, Ser: 1.24 mg/dL (ref 0.76–1.27)
GFR calc Af Amer: 62 mL/min/{1.73_m2} (ref >59)
GFR calc non Af Amer: 53 mL/min/{1.73_m2} — ABNORMAL LOW (ref >59)
GLUCOSE: 117 mg/dL — AB (ref 65–99)
POTASSIUM: 5 mmol/L (ref 3.5–5.2)
SODIUM: 142 mmol/L (ref 134–144)

## 2017-12-02 MED ORDER — NITROGLYCERIN 0.4 MG SL SUBL: 0.4000 mg | SUBLINGUAL_TABLET | SUBLINGUAL | 3 refills | Status: DC | PRN

## 2017-12-02 MED ORDER — ISOSORBIDE MONONITRATE ER 30 MG PO TB24: 30.0000 mg | ORAL_TABLET | Freq: Every day | ORAL | 5 refills | Status: DC

## 2017-12-02 NOTE — Patient Instructions (Addendum)
MEDICATION  INSTRUCTIONS  --- START IMDUR ( ISOSORBIDE MONO) 30 MG  ONE TABLET DAILY.  --- MAY USE NITROGLYCERIN  TABLETS SUBLINGUAL  IF NEEDED.      LABS- TODAY BMP   SCHEDULE AT  [facility] - Thursday July 11,2019. Your physician has requested that you have a cardiac catheterization. Cardiac catheterization is used to diagnose and/or treat various heart conditions. Doctors may recommend this procedure for a number of different reasons. The most common reason is to evaluate chest pain. Chest pain can be a symptom of coronary artery disease (CAD), and cardiac catheterization can show whether plaque is narrowing or blocking your heart's arteries. This procedure is also used to evaluate the valves, as well as measure the blood flow and oxygen levels in different parts of your heart. For further information please visit [email or web address]. Please follow instruction sheet, as given.      Your physician recommends that you schedule a follow-up appointment in [place].         [facility] [address] [place] [address] Dept: [phone] Loc: [place]  12/02/2017  You are scheduled for a Cardiac Catheterization on Thursday, July 11 with Dr. [name].  1. Please arrive at the [facility] (Main Entrance A) at [facility]: [address] [place], [place] [address] at 11:30 AM (two hours before your procedure to ensure your preparation). Free valet parking service is available.   Special note: Every effort is made to have your procedure done on time. Please understand that emergencies sometimes delay scheduled procedures.  2. Diet: You may have a clear liquid breakfast, but nothing after  6AM on your procedure day.  3. Labs: TODAY -- BMP   4. Medication instructions in preparation for your procedure:    Stop taking Eliquis  (Apixiban) on Tuesday, July 9.  DO NOT TAKE IRBESARTAN-HCTZ THE MORNNIG OF THE PROCEDURE    On the morning of your procedure, take your Aspirin 81 MG and any morning medicines NOT listed above.  You may use sips of water.  5. Plan for one night stay--bring personal belongings. 6. Bring a current list of your medications and current insurance cards. 7. You MUST have a responsible person to drive you home. 8. Someone MUST be with you the first 24 hours after you arrive home or your discharge will be delayed. 9. Please wear clothes that are easy to get on and off and wear slip-on shoes.  Thank you for allowing [place] to care for you!   -- [place] [facility]

## 2017-12-02 NOTE — Progress Notes (Signed)
Cardiology Office Note   Date:  12/02/2017   ID:  Dillon Young, DOB [birth year], MRN [id number]  PCP:  [name], MD  Cardiologist: Dr. [name], 11/06/2017 At[facility] Fibrillation Clinic: Roderic Palau, NP, 11/20/2017 [name], PA-C   Chief Complaint  Patient presents with  . Follow-up  . Chest Pain  . Dizziness  . Shortness of Breath    History of Present Illness: Dillon Young is a 84 y.o. male with a history of PCI CFX [place], HTN, [place], R-CEA 2013, Cath 12/2015 w/ DES RCA, RI 90% and LAD 70%>>med rx, Afib dx 09/2017, EF now 30-35%, on Eliquis/ASA and off Plavix, probable sleep apnea but does not feel he would tolerate CPAP, sleeps in a recliner  6/26 office visit, patient set up for cardioversion 7/2 cardioversion successful with 2 stacked shocks, patient in sinus rhythm at d/c.  8/12 CHF clinic appointment  Dillon Young presents for cardiology follow up.  [place] DCCV. He started getting chest pain about 15 minutes into the walk, had to stop. He has had chest pain off/on since then. Feels he went back out of rhythm then.   His chest pain is his usual angina, has had multiple episodes. It reaches 4/10. It can last for 1-2 hours. Pressure in the middle of his chest. Makes him SOB, no N&V. He has had it almost every day since the DCCV. Did not try any nitro, he is out. Sx resolve w/ rest, but can last a couple of hours.   Woke up in a cold sweat last pm. Also had palpitations.   Has had palpitations at various times in the last week, feels he has been out of rhythm the whole time.   No LE edema, is having trouble sleeping, due to nasal congestion. Describes orthopnea that had gotten better but is worse again due to nasal congestion. He will wake up unable to breathe through his nose despite using an inhaler at bedtime.  Symptoms are worse this time of year.  After his 2017 cath, he had problems w/ the nerve in his leg for  3 months. Not able to do a radial cath, have to use the groin.    He is weighing daily, wt is stable or down a little. Feels he gets SOB w/ exertion.  But also gets palpitations then.  He does not know how high his heart rate gets with exertion.   Past Medical History:  Diagnosis Date  . Adenomatous colon polyp   . Allergic rhinitis   . BPH (benign prostatic hyperplasia)   . CAD (coronary artery disease)    sees Dr. [name]   . Carotid artery disease ([place])   . CHF (congestive heart failure) ([place])   . Chronic kidney disease    [place]. Prostate cancer  . Clotting disorder ([place])   . CVA (cerebral infarction)   . Diabetes mellitus    "Borderline"  . Diverticulitis   . GERD (gastroesophageal reflux disease)    history  . Hemorrhoids   . HTN (hypertension)   . Hyperlipidemia LDL goal <70   . IBS (irritable bowel syndrome)   . Low back pain   . Prostate cancer ([place]) 2008   seed implantation  . Sleep apnea    last study was aborted.  Has never worn a CPAP.    Marland Kitchen Smoker     Past Surgical History:  Procedure Laterality Date  .  APPENDECTOMY    . CARDIAC CATHETERIZATION N/A 01/16/2016   Procedure: Left Heart Cath and Coronary Angiography;  Surgeon: Jettie Booze, MD;  Location: [facility] CV LAB;  Service: Cardiovascular;  Laterality: N/A;  . CARDIAC CATHETERIZATION N/A 01/16/2016   Procedure: Coronary Stent Intervention;  Surgeon: Jettie Booze, MD;  Location: [place] CV LAB;  Service: Cardiovascular;  Laterality: N/A;  . CARDIOVERSION N/A 11/26/2017   Procedure: CARDIOVERSION;  Surgeon: [name], MD;  Location[facility] ENDOSCOPY;  Service: Cardiovascular;  Laterality: N/A;  . Cataract with Lens     Bilateral  . COLONOSCOPY  10/21/2008   diverticulosis, radiation proctitis, internal hemorrhoids  . ENDARTERECTOMY  11/12/2011   Procedure: ENDARTERECTOMY CAROTID;  Surgeon: Rosetta Posner, MD;  Location: [facility] OR;  Service: Vascular;  Laterality: Right;  . Esophageal  dilitation    . HEMORRHOID BANDING    . Lithrotripsy      x 3  . LUMBAR LAMINECTOMY    . TRANSURETHRAL RESECTION OF PROSTATE  10/2003    Current Outpatient Medications  Medication Sig Dispense Refill  . apixaban (ELIQUIS) 5 MG TABS tablet Take 1 tablet (5 mg total) by mouth 2 (two) times daily. 180 tablet 01  . aspirin EC 81 MG tablet Take 81 mg by mouth every evening.     Marland Kitchen azelastine (ASTELIN) 0.1 % nasal spray Place 1 spray into both nostrils 2 (two) times daily. Use in each nostril as directed (Patient taking differently: Place 1 spray into both nostrils daily as needed for allergies. ) 30 mL 11  . carvedilol (COREG) 6.25 MG tablet Take 0.5 tablets (3.125 mg total) by mouth 2 (two) times daily. 180 tablet 1  . cholecalciferol (VITAMIN D) 1000 UNITS tablet Take 1,000 Units by mouth at bedtime.     . fluticasone (FLONASE) 50 MCG/ACT nasal spray Place 1-2 sprays into both nostrils daily as needed for allergies. 48 g 1  . hydrocortisone (ANUSOL-HC) 2.5 % rectal cream Place 1 application rectally 2 (two) times daily as needed for hemorrhoids.     . Hypromellose (ARTIFICIAL TEARS OP) Place 1 drop into both eyes 4 (four) times daily as needed (dry eyes).    . irbesartan-hydrochlorothiazide (AVALIDE) 300-12.5 MG tablet Take 1 tablet by mouth daily. 90 tablet 3  . Melatonin 5 MG CAPS Take 1 capsule (5 mg total) by mouth daily as needed (sleep). (Patient taking differently: Take 1 capsule by mouth at bedtime as needed (sleep). )    . nitroGLYCERIN (NITROSTAT) 0.4 MG SL tablet Place 1 tablet (0.4 mg total) under the tongue every 5 (five) minutes as needed for chest pain. 25 tablet 3  . rosuvastatin (CRESTOR) 10 MG tablet TAKE 1 TABLET BY MOUTH EVERY DAY (Patient taking differently: TAKE 1 TABLET BY MOUTH EVERY DAY at bedtime.) 90 tablet 3  . vitamin C (ASCORBIC ACID) 500 MG tablet Take 500 mg by mouth at bedtime.     . Wheat Dextrin (BENEFIBER PO) Take 2 Scoops by mouth daily.      No current  facility-administered medications for this visit.     Allergies:   Lipitor [atorvastatin]    Social History:  The patient  reports that he quit smoking about 9 years ago. His smoking use included cigarettes. He has a 90.00 pack-year smoking history. He has never used smokeless tobacco. He reports that he drinks alcohol. He reports that he does not use drugs.   Family History:  The patient's family history includes Heart disease in  his father.    ROS:  Please see the history of present illness. All other systems are reviewed and negative.    PHYSICAL EXAM: VS:  BP 130/80 (BP Location: Left Arm, Patient Position: Sitting, Cuff Size: Normal)   Pulse 97   Ht 5\' 9"  (1.753 m)   Wt 182 lb (82.6 kg)   BMI 26.88 kg/m  , BMI Body mass index is 26.88 kg/m. GEN: Well nourished, well developed, male in no acute distress  HEENT: normal for age  Neck: no JVD, no carotid bruit, no masses Cardiac: Irreg R&R; no murmur, no rubs, or gallops Respiratory: Decreased breath sounds bases bilaterally, normal work of breathing GI: soft, nontender, nondistended, + BS MS: no deformity or atrophy; no edema; distal pulses are 2+ in all 4 extremities   Skin: warm and dry, no rash Neuro:  Strength and sensation are intact Psych: euthymic mood, full affect  CAROTID DOPPLERS: 06/13/2017, repeat in 1 year Final Interpretation: Right Carotid: Velocities in the right ICA are consistent with a 1-39% stenosis.  Left Carotid: Velocities in the left ICA are consistent with a 40-59% stenosis.       Non-hemodynamically significant plaque noted in the CCA.  Vertebrals: Both vertebral arteries were patent with antegrade flow. Subclavians: Normal flow hemodynamics were seen in bilateral subclavian       arteries.  CATH: 01/16/2016  Eccentric Mid LAD lesion, 70 %stenosed in some views.  Ramus lesion, 90 %stenosed. Very small vessel, not suitable for intervention.  Ost 1st Mrg to 1st Mrg lesion, 20  %stenosed.  Mid RCA lesion, 90 %stenosed. A STENT SYNERGY DES 3.5X28 drug eluting stent was successfully placed.  Post intervention, there is a 0% residual stenosis.  LV end diastolic pressure is normal.  Continue dual antiplatelet therapy for at least a year.  We did not address the LAD due to significant pain and bradycardia during the RCA PCI.  This resolved at the end of the procedure.  D/w Dr. [name] who will decide on further intervention.   Post-Intervention Diagram           ECHO: 10/07/2017 - Left ventricle: The cavity size was severely dilated. Systolic   function was moderately to severely reduced. The estimated   ejection fraction was in the range of 30% to 35%. There is   hypokinesis of the lateral myocardium. There is akinesis of the   inferolateral and apical myocardium. The study was not   technically sufficient to allow evaluation of LV diastolic   dysfunction due to atrial fibrillation. - Aorta: Maximal diameter of abdominal aorta: 41 mm. - Abdominal aorta: The abdominal aorta was moderately dilated. - Tricuspid valve: There was trivial regurgitation. - Pulmonic valve: There was trivial regurgitation.  ECHO: 01/16/2016 - Left ventricle: The cavity size was mildly dilated. Wall   thickness was normal. Systolic function was normal. The estimated   ejection fraction was in the range of 50% to 55%. Basal inferior   akinesis. The study is not technically sufficient to allow   evaluation of LV diastolic function. - Left atrium: The atrium was normal in size. - Right atrium: The atrium was mildly dilated. Impressions: - Compared to a prior echo in 2014, the LVEF is stable. There may   be basal inferior akinesis and the LV is mildly dilated in this   area. The RA is mildly dilated. Significant ectopy during the   study made interpretation difficult.  EKG:  EKG is ordered today. The ekg ordered today demonstrates  Atrial fib, HR 97   Recent Labs: 10/02/2017: ALT  13 11/20/2017: BUN 20; Creatinine, Ser 1.37; Hemoglobin 15.7; Magnesium 2.1; Platelets 176; Potassium 4.6; Sodium 140; TSH 2.346    Lipid Panel    Component Value Date/Time   CHOL 121 10/02/2017 1011   TRIG 127 10/02/2017 1011   HDL 46 10/02/2017 1011   CHOLHDL 2.6 10/02/2017 1011   CHOLHDL 2.8 07/27/2016 0912   VLDL 25 07/27/2016 0912   LDLCALC 50 10/02/2017 1011     Wt Readings from Last 3 Encounters:  12/02/17 182 lb (82.6 kg)  11/26/17 185 lb (83.9 kg)  11/20/17 185 lb (83.9 kg)     Other studies Reviewed: Additional studies/ records that were reviewed today include: Office notes, hospital records and testing.  ASSESSMENT AND PLAN:  1.  Progressive angina: He has not had resting chest pain, but has had chest pain regularly with exertion.  It may be related to an elevated heart rate from the atrial fibrillation, but he has known disease that was at least moderate 2 years ago. - Recent echocardiogram showed a new lateral wall motion abnormality -I feel cardiac catheterization is indicated. -I reviewed the risks and benefits of cardiac catheterization with he and his wife. The risks and benefits of a cardiac catheterization including, but not limited to, death, stroke, MI, kidney damage and bleeding were discussed, they indicate understanding and agree to proceed. - Dr. [name] is in the Cath Lab this Thursday.  He reviewed the case and is in agreement with the plan. - Mr. Maxon states that he cannot have a radial cath, has to have a groin cath. - He had problems with the nerve in his leg after his last heart catheterization, he hopes this does not happen again  2.  Persistent atrial fibrillation: - The cardioversion was successful, but did not last very long. - He is to be seen in the atrial fibrillation clinic tomorrow, keep this appointment. -He may tolerate a higher dose of carvedilol as he is on the minimal dose 3.125 mg twice daily, will leave to the A. fib clinic  3.   Chronic anticoagulation: He has not missed any doses of his Eliquis - CHA2DS2-VASc = 5 (age x 2, CAD, HTN, CHF) -Hold the Eliquis as directed for the Heart cath  4.  Chronic systolic CHF: -She has an appointment in the heart failure clinic with Dr. [name] in August, keep this - He is on a low dose of carvedilol and irbesartan/ HCTZ - Weight is stable or down a little, volume is good - His creatinine was up slightly when last tested, recheck today. - Could add Spironolactone or start Entresto, volume status is stable, will leave to Dr. [name]    Current medicines are reviewed at length with the patient today.  The patient does not have concerns regarding medicines.  The following changes have been made:  no change  Labs/ tests ordered today include:   Orders Placed This Encounter  Procedures  . Basic metabolic panel  . EKG 12-Lead  . LEFT HEART CATHETERIZATION WITH CORONARY ANGIOGRAM     Disposition:   FU with Dr. [name], Dr. [name], and A. fib clinic  Signed, [name], PA-C  12/02/2017 11:23 AM    [place] Phone: [phone]; Fax: [phone]  This note was written with the assistance of speech recognition software. Please excuse any transcriptional errors.

## 2017-12-02 NOTE — H&P (Addendum)
Cardiology History and Physical   Date:  12/02/2017   ID:  Dillon Young, DOB 1932/07/28, MRN [id number]  PCP:  [name], MD  Cardiologist: Dr. [name], 11/06/2017 [facility]: Roderic Palau, NP, 11/20/2017 [name], PA-C   Chief Complaint  Patient presents with  . Follow-up  . Chest Pain  . Dizziness  . Shortness of Breath    History of Present Illness: Dillon Young is a 84 y.o. male with a history of PCI CFX [place], HTN, [place], R-CEA 2013, Cath 12/2015 w/ DES RCA, RI 90% and LAD 70%>>med rx, Afib dx 09/2017, EF now 30-35%, on Eliquis/ASA and off Plavix, probable sleep apnea but does not feel he would tolerate CPAP, sleeps in a recliner  6/26 office visit, patient set up for cardioversion 7/2 cardioversion successful with 2 stacked shocks, patient in sinus rhythm at d/c.  8/12 CHF clinic appointment  Dillon Young presents for cardiology follow up.  [place] DCCV. He started getting chest pain about 15 minutes into the walk, had to stop. He has had chest pain off/on since then. Feels he went back out of rhythm then.   His chest pain is his usual angina, has had multiple episodes. It reaches 4/10. It can last for 1-2 hours. Pressure in the middle of his chest. Makes him SOB, no N&V. He has had it almost every day since the DCCV. Did not try any nitro, he is out. Sx resolve w/ rest, but can last a couple of hours.   Woke up in a cold sweat last pm. Also had palpitations.   Has had palpitations at various times in the last week, feels he has been out of rhythm the whole time.   No LE edema, is having trouble sleeping, due to nasal congestion. Describes orthopnea that had gotten better but is worse again due to nasal congestion. He will wake up unable to breathe through his nose despite using an inhaler at bedtime.  Symptoms are worse this time of year.  After his 2017 cath, he had problems w/ the nerve in his  leg for 3 months. Not able to do a radial cath, have to use the groin.    He is weighing daily, wt is stable or down a little. Feels he gets SOB w/ exertion.  But also gets palpitations then.  He does not know how high his heart rate gets with exertion.   Past Medical History:  Diagnosis Date  . Adenomatous colon polyp   . Allergic rhinitis   . BPH (benign prostatic hyperplasia)   . CAD (coronary artery disease)    sees Dr. [name]   . Carotid artery disease ([place])   . CHF (congestive heart failure) ([place])   . Chronic kidney disease    [place]. Prostate cancer  . Clotting disorder ([place])   . CVA (cerebral infarction)   . Diabetes mellitus    "Borderline"  . Diverticulitis   . GERD (gastroesophageal reflux disease)    history  . Hemorrhoids   . HTN (hypertension)   . Hyperlipidemia LDL goal <70   . IBS (irritable bowel syndrome)   . Low back pain   . Prostate cancer ([place]) 2008   seed implantation  . Sleep apnea    last study was aborted.  Has never worn a CPAP.    Marland Kitchen Smoker     Past Surgical History:  Procedure Laterality Date  .  APPENDECTOMY    . CARDIAC CATHETERIZATION N/A 01/16/2016   Procedure: Left Heart Cath and Coronary Angiography;  Surgeon: Jettie Booze, MD;  Location: [facility];  Service: Cardiovascular;  Laterality: N/A;  . CARDIAC CATHETERIZATION N/A 01/16/2016   Procedure: Coronary Stent Intervention;  Surgeon: Jettie Booze, MD;  Location: [facility];  Service: Cardiovascular;  Laterality: N/A;  . CARDIOVERSION N/A 11/26/2017   Procedure: CARDIOVERSION;  Surgeon: [name], MD;  Location: [facility];  Service: Cardiovascular;  Laterality: N/A;  . Cataract with Lens     Bilateral  . COLONOSCOPY  10/21/2008   diverticulosis, radiation proctitis, internal hemorrhoids  . ENDARTERECTOMY  11/12/2011   Procedure: ENDARTERECTOMY CAROTID;  Surgeon: Rosetta Posner, MD;  Location: [facility] OR;  Service: Vascular;  Laterality: Right;  .  Esophageal dilitation    . HEMORRHOID BANDING    . Lithrotripsy      x 3  . LUMBAR LAMINECTOMY    . TRANSURETHRAL RESECTION OF PROSTATE  10/2003    Current Outpatient Medications  Medication Sig Dispense Refill  . apixaban (ELIQUIS) 5 MG TABS tablet Take 1 tablet (5 mg total) by mouth 2 (two) times daily. 180 tablet 01  . aspirin EC 81 MG tablet Take 81 mg by mouth every evening.     Marland Kitchen azelastine (ASTELIN) 0.1 % nasal spray Place 1 spray into both nostrils 2 (two) times daily. Use in each nostril as directed (Patient taking differently: Place 1 spray into both nostrils daily as needed for allergies. ) 30 mL 11  . carvedilol (COREG) 6.25 MG tablet Take 0.5 tablets (3.125 mg total) by mouth 2 (two) times daily. 180 tablet 1  . cholecalciferol (VITAMIN D) 1000 UNITS tablet Take 1,000 Units by mouth at bedtime.     . fluticasone (FLONASE) 50 MCG/ACT nasal spray Place 1-2 sprays into both nostrils daily as needed for allergies. 48 g 1  . hydrocortisone (ANUSOL-HC) 2.5 % rectal cream Place 1 application rectally 2 (two) times daily as needed for hemorrhoids.     . Hypromellose (ARTIFICIAL TEARS OP) Place 1 drop into both eyes 4 (four) times daily as needed (dry eyes).    . irbesartan-hydrochlorothiazide (AVALIDE) 300-12.5 MG tablet Take 1 tablet by mouth daily. 90 tablet 3  . Melatonin 5 MG CAPS Take 1 capsule (5 mg total) by mouth daily as needed (sleep). (Patient taking differently: Take 1 capsule by mouth at bedtime as needed (sleep). )    . nitroGLYCERIN (NITROSTAT) 0.4 MG SL tablet Place 1 tablet (0.4 mg total) under the tongue every 5 (five) minutes as needed for chest pain. 25 tablet 3  . rosuvastatin (CRESTOR) 10 MG tablet TAKE 1 TABLET BY MOUTH EVERY DAY (Patient taking differently: TAKE 1 TABLET BY MOUTH EVERY DAY at bedtime.) 90 tablet 3  . vitamin C (ASCORBIC ACID) 500 MG tablet Take 500 mg by mouth at bedtime.     . Wheat Dextrin (BENEFIBER PO) Take 2 Scoops by mouth daily.      No  current facility-administered medications for this visit.     Allergies:   Lipitor [atorvastatin]    Social History:  The patient  reports that he quit smoking about 9 years ago. His smoking use included cigarettes. He has a 90.00 pack-year smoking history. He has never used smokeless tobacco. He reports that he drinks alcohol. He reports that he does not use drugs.   Family History:  The patient's family history includes Heart disease in  his father.   Family Status: The patient indicated that his mother is deceased. He indicated that his father is deceased. He indicated that the status of his neg hx is unknown.   ROS:  Please see the history of present illness. All other systems are reviewed and negative.    PHYSICAL EXAM: VS:  BP 130/80 (BP Location: Left Arm, Patient Position: Sitting, Cuff Size: Normal)   Pulse 97   Ht 5\' 9"  (1.753 m)   Wt 182 lb (82.6 kg)   BMI 26.88 kg/m  , BMI Body mass index is 26.88 kg/m. GEN: Well nourished, well developed, male in no acute distress  HEENT: normal for age  Neck: no JVD, no carotid bruit, no masses Cardiac: Irreg R&R; no murmur, no rubs, or gallops Respiratory: Decreased breath sounds bases bilaterally, normal work of breathing GI: soft, nontender, nondistended, + BS MS: no deformity or atrophy; no edema; distal pulses are 2+ in all 4 extremities   Skin: warm and dry, no rash Neuro:  Strength and sensation are intact Psych: euthymic mood, full affect  CAROTID DOPPLERS: 06/13/2017, repeat in 1 year Final Interpretation: Right Carotid: Velocities in the right ICA are consistent with a 1-39% stenosis.  Left Carotid: Velocities in the left ICA are consistent with a 40-59% stenosis.       Non-hemodynamically significant plaque noted in the CCA.  Vertebrals: Both vertebral arteries were patent with antegrade flow. Subclavians: Normal flow hemodynamics were seen in bilateral subclavian       arteries.  CATH:  01/16/2016  Eccentric Mid LAD lesion, 70 %stenosed in some views.  Ramus lesion, 90 %stenosed. Very small vessel, not suitable for intervention.  Ost 1st Mrg to 1st Mrg lesion, 20 %stenosed.  Mid RCA lesion, 90 %stenosed. A STENT SYNERGY DES 3.5X28 drug eluting stent was successfully placed.  Post intervention, there is a 0% residual stenosis.  LV end diastolic pressure is normal.  Continue dual antiplatelet therapy for at least a year.  We did not address the LAD due to significant pain and bradycardia during the RCA PCI.  This resolved at the end of the procedure.  D/w Dr. [name] who will decide on further intervention.   Post-Intervention Diagram           ECHO: 10/07/2017 - Left ventricle: The cavity size was severely dilated. Systolic   function was moderately to severely reduced. The estimated   ejection fraction was in the range of 30% to 35%. There is   hypokinesis of the lateral myocardium. There is akinesis of the   inferolateral and apical myocardium. The study was not   technically sufficient to allow evaluation of LV diastolic   dysfunction due to atrial fibrillation. - Aorta: Maximal diameter of abdominal aorta: 41 mm. - Abdominal aorta: The abdominal aorta was moderately dilated. - Tricuspid valve: There was trivial regurgitation. - Pulmonic valve: There was trivial regurgitation.  ECHO: 01/16/2016 - Left ventricle: The cavity size was mildly dilated. Wall   thickness was normal. Systolic function was normal. The estimated   ejection fraction was in the range of 50% to 55%. Basal inferior   akinesis. The study is not technically sufficient to allow   evaluation of LV diastolic function. - Left atrium: The atrium was normal in size. - Right atrium: The atrium was mildly dilated. Impressions: - Compared to a prior echo in 2014, the LVEF is stable. There may   be basal inferior akinesis and the LV is mildly dilated in this  area. The RA is mildly dilated.  Significant ectopy during the   study made interpretation difficult.  EKG:  EKG is ordered today. The ekg ordered today demonstrates Atrial fib, HR 97   Recent Labs: 10/02/2017: ALT 13 11/20/2017: BUN 20; Creatinine, Ser 1.37; Hemoglobin 15.7; Magnesium 2.1; Platelets 176; Potassium 4.6; Sodium 140; TSH 2.346    Lipid Panel    Component Value Date/Time   CHOL 121 10/02/2017 1011   TRIG 127 10/02/2017 1011   HDL 46 10/02/2017 1011   CHOLHDL 2.6 10/02/2017 1011   CHOLHDL 2.8 07/27/2016 0912   VLDL 25 07/27/2016 0912   LDLCALC 50 10/02/2017 1011     Wt Readings from Last 3 Encounters:  12/02/17 182 lb (82.6 kg)  11/26/17 185 lb (83.9 kg)  11/20/17 185 lb (83.9 kg)     Other studies Reviewed: Additional studies/ records that were reviewed today include: Office notes, hospital records and testing.  ASSESSMENT AND PLAN:  1.  Progressive angina: He has not had resting chest pain, but has had chest pain regularly with exertion.  It may be related to an elevated heart rate from the atrial fibrillation, but he has known disease that was at least moderate 2 years ago. - Recent echocardiogram showed a new lateral wall motion abnormality -I feel cardiac catheterization is indicated. -I reviewed the risks and benefits of cardiac catheterization with he and his wife. The risks and benefits of a cardiac catheterization including, but not limited to, death, stroke, MI, kidney damage and bleeding were discussed, they indicate understanding and agree to proceed. - Dr. [name] is in the Cath Lab this Thursday.  He reviewed the case and is in agreement with the plan. - Mr. Kerce states that he cannot have a radial cath, has to have a groin cath. - He had problems with the nerve in his leg after his last heart catheterization, he hopes this does not happen again  2.  Persistent atrial fibrillation: - The cardioversion was successful, but did not last very long. - He is to be seen in the atrial  fibrillation clinic tomorrow, keep this appointment. -He may tolerate a higher dose of carvedilol as he is on the minimal dose 3.125 mg twice daily, will leave to the A. fib clinic  3.  Chronic anticoagulation: He has not missed any doses of his Eliquis - CHA2DS2-VASc = 5 (age x 2, CAD, HTN, CHF) -Hold the Eliquis as directed for the Heart cath  4.  Chronic systolic CHF: -She has an appointment in the heart failure clinic with Dr. [name] in August, keep this - He is on a low dose of carvedilol and irbesartan/ HCTZ - Weight is stable or down a little, volume is good - His creatinine was up slightly when last tested, recheck today. - Could add Spironolactone or start Entresto, volume status is stable, will leave to Dr. [name]    Current medicines are reviewed at length with the patient today.  The patient does not have concerns regarding medicines.  The following changes have been made:  no change  Labs/ tests ordered today include:   Orders Placed This Encounter  Procedures  . Basic metabolic panel  . EKG 12-Lead  . LEFT HEART CATHETERIZATION WITH CORONARY ANGIOGRAM     Disposition:   FU with Dr. [name], Dr. [name], and A. fib clinic  Signed, [name], PA-C  12/02/2017 11:23 AM    [place] Phone: [phone]; Fax: [phone]  This  note was written with the assistance of speech recognition software. Please excuse any transcriptional errors.  Agree with note by [name] PA-C  Patient with known CAD, ongoing chest pain referred for diagnostic coronary angiography.  He will need to hold his Eliquis oral anticoagulation for his heart catheterization.  [name], M.D., [place], [facility], [place] [address] [place], [place]  [address]  [phone] 12/02/2017 1:03 PM

## 2017-12-02 NOTE — Telephone Encounter (Signed)
Spoke with patient and he has been having intermittent chest pains since his cardioversion last week. Scheduled appointment this morning with Suanne Marker B PA

## 2017-12-03 ENCOUNTER — Encounter (HOSPITAL_COMMUNITY): Payer: Self-pay | Admitting: Nurse Practitioner

## 2017-12-03 ENCOUNTER — Ambulatory Visit (HOSPITAL_COMMUNITY)
Admission: RE | Admit: 2017-12-03 | Discharge: 2017-12-03 | Disposition: A | Discharge status: Home / Self Care | Payer: PPO | Source: Ambulatory Visit | Attending: Nurse Practitioner | Admitting: Nurse Practitioner

## 2017-12-03 VITALS — BP 116/64 | HR 89 | Ht 69.0 in | Wt 184.0 lb

## 2017-12-03 DIAGNOSIS — G4730 Sleep apnea, unspecified: Secondary | ICD-10-CM | POA: Insufficient documentation

## 2017-12-03 DIAGNOSIS — Z79899 Other long term (current) drug therapy: Secondary | ICD-10-CM | POA: Insufficient documentation

## 2017-12-03 DIAGNOSIS — I4819 Other persistent atrial fibrillation: Secondary | ICD-10-CM

## 2017-12-03 DIAGNOSIS — I481 Persistent atrial fibrillation: Secondary | ICD-10-CM | POA: Diagnosis not present

## 2017-12-03 DIAGNOSIS — E785 Hyperlipidemia, unspecified: Secondary | ICD-10-CM | POA: Diagnosis not present

## 2017-12-03 DIAGNOSIS — Z7982 Long term (current) use of aspirin: Secondary | ICD-10-CM | POA: Diagnosis not present

## 2017-12-03 DIAGNOSIS — I4891 Unspecified atrial fibrillation: Secondary | ICD-10-CM | POA: Diagnosis not present

## 2017-12-03 DIAGNOSIS — Z955 Presence of coronary angioplasty implant and graft: Secondary | ICD-10-CM | POA: Diagnosis not present

## 2017-12-03 DIAGNOSIS — Z87891 Personal history of nicotine dependence: Secondary | ICD-10-CM | POA: Insufficient documentation

## 2017-12-03 DIAGNOSIS — I2510 Atherosclerotic heart disease of native coronary artery without angina pectoris: Secondary | ICD-10-CM | POA: Diagnosis not present

## 2017-12-03 DIAGNOSIS — Z8546 Personal history of malignant neoplasm of prostate: Secondary | ICD-10-CM | POA: Diagnosis not present

## 2017-12-03 DIAGNOSIS — N400 Enlarged prostate without lower urinary tract symptoms: Secondary | ICD-10-CM | POA: Diagnosis not present

## 2017-12-03 DIAGNOSIS — N189 Chronic kidney disease, unspecified: Secondary | ICD-10-CM | POA: Diagnosis not present

## 2017-12-03 DIAGNOSIS — I509 Heart failure, unspecified: Secondary | ICD-10-CM | POA: Insufficient documentation

## 2017-12-03 DIAGNOSIS — Z8249 Family history of ischemic heart disease and other diseases of the circulatory system: Secondary | ICD-10-CM | POA: Diagnosis not present

## 2017-12-03 DIAGNOSIS — K589 Irritable bowel syndrome without diarrhea: Secondary | ICD-10-CM | POA: Insufficient documentation

## 2017-12-03 DIAGNOSIS — Z888 Allergy status to other drugs, medicaments and biological substances status: Secondary | ICD-10-CM | POA: Insufficient documentation

## 2017-12-03 DIAGNOSIS — Z8673 Personal history of transient ischemic attack (TIA), and cerebral infarction without residual deficits: Secondary | ICD-10-CM | POA: Diagnosis not present

## 2017-12-03 DIAGNOSIS — Z86010 Personal history of colonic polyps: Secondary | ICD-10-CM | POA: Diagnosis not present

## 2017-12-03 DIAGNOSIS — I130 Hypertensive heart and chronic kidney disease with heart failure and stage 1 through stage 4 chronic kidney disease, or unspecified chronic kidney disease: Secondary | ICD-10-CM | POA: Diagnosis not present

## 2017-12-03 DIAGNOSIS — E1122 Type 2 diabetes mellitus with diabetic chronic kidney disease: Secondary | ICD-10-CM | POA: Insufficient documentation

## 2017-12-03 NOTE — Progress Notes (Addendum)
Primary Care Physician: [name], MD Referring Physician:Dr. Harvel Meskill is a 84 y.o. male with a h/o coronary artery disease status post angioplasty in 1994 and 1995of his circumflex coronary artery. . He has a history of hypertension and hyperlipidemia on medication. He had right carotid endarterectomy performed by Dr. Curt Jews June 2013.Myoview stress test performed 01/09/16 was notable for ischemia in the RCA distribution. Based on this he underwent cardiac catheterization by Dr. Varanasi8/21/17 revealing a 90% segmental mid dominant RCA stenosis which was stented with a Synergy 3.5 mm x 20 mm long drug-eluting stent. He did have a very small ramus branch that was 90% stenosed and not suitable for intervention as well as a 70% mid LAD lesion although he had no evidence of anterior ischemia on his stress test. His symptoms of chest pain have resolved.  He was diagnosed in May with new  onset  afib from which the pt is asymptomatic. Echo was done and showed EF decreased to 30-35%, normal EF by echo in August 2017. He has been on Eliquis 5 mg bid since early May without missed doses.  Plavix were stopped at that time, ASA continued and pt was started on carvedilol 6.25 mg bid.He states that he had a sleep study in the past but never slept enough with the study to get good results. He is afraid that he would never be able to tolerate a cpap. Sleeps in a recliner now, " I just feel more comfortable".  F/u in afib clinic 5/9, unfortunately cardioversion only held for 1-2 days.He developed chest discomfort with walking a few days ago and was seen by R. Barrett, PA yesterday and is pending a cardiac cath on Thursday. Eliquis is on hold as of this am. He was started on Imdur and now states that he feels great. This has also made his BP better controlled as well. He is rate controlled, states that he is not aware of his afib..  Today, he denies symptoms of palpitations,  shortness of  breath, orthopnea, PND, lower extremity edema, dizziness, presyncope, syncope, or neurologic sequela. + for exertional chest pain with walking x 1. The patient is tolerating medications without difficulties and is otherwise without complaint today.   Past Medical History:  Diagnosis Date  . Adenomatous colon polyp   . Allergic rhinitis   . BPH (benign prostatic hyperplasia)   . CAD (coronary artery disease)    sees Dr. [name]   . Carotid artery disease ([place])   . CHF (congestive heart failure) ([place])   . Chronic kidney disease    Sones. Prostate cancer  . Clotting disorder ([place])   . CVA (cerebral infarction)   . Diabetes mellitus    "Borderline"  . Diverticulitis   . GERD (gastroesophageal reflux disease)    history  . Hemorrhoids   . HTN (hypertension)   . Hyperlipidemia LDL goal <70   . IBS (irritable bowel syndrome)   . Low back pain   . Prostate cancer ([place]) 2008   seed implantation  . Sleep apnea    last study was aborted.  Has never worn a CPAP.    Marland Kitchen Smoker    Past Surgical History:  Procedure Laterality Date  . APPENDECTOMY    . CARDIAC CATHETERIZATION N/A 01/16/2016   Procedure: Left Heart Cath and Coronary Angiography;  Surgeon: Jettie Booze, MD;  Location: [place] [facility];  Service: Cardiovascular;  Laterality: N/A;  . CARDIAC CATHETERIZATION N/A  01/16/2016   Procedure: Coronary Stent Intervention;  Surgeon: Jettie Booze, MD;  Location: [place] [facility];  Service: Cardiovascular;  Laterality: N/A;  . CARDIOVERSION N/A 11/26/2017   Procedure: CARDIOVERSION;  Surgeon: [name], MD;  Location: [facility] ENDOSCOPY;  Service: Cardiovascular;  Laterality: N/A;  . Cataract with Lens     Bilateral  . COLONOSCOPY  10/21/2008   diverticulosis, radiation proctitis, internal hemorrhoids  . ENDARTERECTOMY  11/12/2011   Procedure: ENDARTERECTOMY CAROTID;  Surgeon: Rosetta Posner, MD;  Location: [facility] OR;  Service: Vascular;  Laterality: Right;  . Esophageal  dilitation    . HEMORRHOID BANDING    . Lithrotripsy      x 3  . LUMBAR LAMINECTOMY    . TRANSURETHRAL RESECTION OF PROSTATE  10/2003    Current Outpatient Medications  Medication Sig Dispense Refill  . apixaban (ELIQUIS) 5 MG TABS tablet Take 1 tablet (5 mg total) by mouth 2 (two) times daily. 180 tablet 01  . aspirin EC 81 MG tablet Take 81 mg by mouth every evening.     Marland Kitchen azelastine (ASTELIN) 0.1 % nasal spray Place 1 spray into both nostrils 2 (two) times daily. Use in each nostril as directed (Patient taking differently: Place 1 spray into both nostrils daily as needed for allergies. ) 30 mL 11  . carvedilol (COREG) 6.25 MG tablet Take 0.5 tablets (3.125 mg total) by mouth 2 (two) times daily. 180 tablet 1  . cholecalciferol (VITAMIN D) 1000 UNITS tablet Take 1,000 Units by mouth at bedtime.     . fluticasone (FLONASE) 50 MCG/ACT nasal spray Place 1-2 sprays into both nostrils daily as needed for allergies. 48 g 1  . hydrocortisone (ANUSOL-HC) 2.5 % rectal cream Place 1 application rectally 2 (two) times daily as needed for hemorrhoids.     . Hypromellose (ARTIFICIAL TEARS OP) Place 1 drop into both eyes 4 (four) times daily as needed (dry eyes).    . irbesartan-hydrochlorothiazide (AVALIDE) 300-12.5 MG tablet Take 1 tablet by mouth daily. 90 tablet 3  . isosorbide mononitrate (IMDUR) 30 MG 24 hr tablet Take 1 tablet (30 mg total) by mouth daily. 30 tablet 5  . Melatonin 5 MG CAPS Take 1 capsule (5 mg total) by mouth daily as needed (sleep). (Patient taking differently: Take 1 capsule by mouth at bedtime as needed (sleep). )    . nitroGLYCERIN (NITROSTAT) 0.4 MG SL tablet Place 1 tablet (0.4 mg total) under the tongue every 5 (five) minutes as needed for chest pain. 25 tablet 3  . rosuvastatin (CRESTOR) 10 MG tablet TAKE 1 TABLET BY MOUTH EVERY DAY (Patient taking differently: TAKE 1 TABLET BY MOUTH EVERY DAY at bedtime.) 90 tablet 3  . vitamin C (ASCORBIC ACID) 500 MG tablet Take 500 mg  by mouth at bedtime.     . Wheat Dextrin (BENEFIBER PO) Take 2 Scoops by mouth daily.      No current facility-administered medications for this encounter.     Allergies  Allergen Reactions  . Lipitor [Atorvastatin] Other (See Comments)    myalgias    Social History   Socioeconomic History  . Marital status: Married    Spouse name: Not on file  . Number of children: 2  . Years of education: Not on file  . Highest education level: Not on file  Occupational History  . 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 Financial resource strain: Not on file  . Food insecurity:    Worry:  Not on file    Inability: Not on file  . Transportation needs:    Medical: Not on file    Non-medical: Not on file  Tobacco Use  . Smoking status: Former Smoker    Packs/day: 1.50    Years: 60.00    Pack years: 90.00    Types: Cigarettes    Last attempt to quit: 07/25/2008    Years since quitting: 9.3  . Smokeless tobacco: Never Used  Substance and Sexual Activity  . Alcohol use: Yes    Alcohol/week: 0.0 oz    Comment: Occ  . Drug use: No  . Sexual activity: Not on file  Lifestyle  . Physical activity:    Days per week: Not on file    Minutes per session: Not on file  . Stress: Not on file  Relationships  . Social connections:    Talks on phone: Not on file    Gets together: Not on file    Attends religious service: Not on file    Active member of club or organization: Not on file    Attends meetings of clubs or organizations: Not on file    Relationship status: Not on file  . Intimate partner violence:    Fear of current or ex partner: Not on file    Emotionally abused: Not on file    Physically abused: Not on file    Forced sexual activity: Not on file  Other Topics Concern  . Not on file  Social History Narrative  . Not on file    Family History  Problem Relation Age of Onset  . Heart disease Father   . Colon cancer Neg Hx   . Esophageal cancer Neg Hx   .  Stomach cancer Neg Hx   . Rectal cancer Neg Hx   . Liver cancer Neg Hx     ROS- All systems are reviewed and negative except as per the HPI above  Physical Exam: Vitals:   12/03/17 0959  BP: 116/64  Pulse: 89  Weight: 184 lb (83.5 kg)  Height: 5\' 9"  (1.753 m)   Wt Readings from Last 3 Encounters:  12/03/17 184 lb (83.5 kg)  12/02/17 182 lb (82.6 kg)  11/26/17 185 lb (83.9 kg)    Labs: Lab Results  Component Value Date   NA 142 12/02/2017   K 5.0 12/02/2017   CL 99 12/02/2017   CO2 26 12/02/2017   GLUCOSE 117 (H) 12/02/2017   BUN 20 12/02/2017   CREATININE 1.24 12/02/2017   CALCIUM 10.4 (H) 12/02/2017   MG 2.1 11/20/2017   Lab Results  Component Value Date   INR 1.06 01/15/2016   Lab Results  Component Value Date   CHOL 121 10/02/2017   HDL 46 10/02/2017   LDLCALC 50 10/02/2017   TRIG 127 10/02/2017     GEN- The patient is well appearing, alert and oriented x 3 today.   Head- normocephalic, atraumatic Eyes-  Sclera clear, conjunctiva pink Ears- hearing intact Oropharynx- clear Neck- supple, no JVP Lymph- no cervical lymphadenopathy Lungs- Clear to ausculation bilaterally, normal work of breathing Heart- irregular rate and rhythm, no murmurs, rubs or gallops, PMI not laterally displaced GI- soft, NT, ND, + BS Extremities- no clubbing, cyanosis, or edema MS- no significant deformity or atrophy Skin- no rash or lesion Psych- euthymic mood, full affect Neuro- strength and sensation are intact  EKG-afib at 89 bpm, qrs int 94 ms, qtc 440 ms Echo-Study Conclusions  - Left ventricle:  The cavity size was severely dilated. Systolic   function was moderately to severely reduced. The estimated   ejection fraction was in the range of 30% to 35%. There is   hypokinesis of the lateral myocardium. There is akinesis of the   inferolateral and apical myocardium. The study was not   technically sufficient to allow evaluation of LV diastolic   dysfunction due to  atrial fibrillation. - Aorta: Maximal diameter of abdominal aorta: 41 mm. - Abdominal aorta: The abdominal aorta was moderately dilated. - Tricuspid valve: There was trivial regurgitation. - Pulmonic valve: There was trivial regurgitation.    Assessment and Plan: 1. New onset afib He is rate controlled He is asymptomatic but with reduced EF Successful cardioversion but with ERAF He has since developed chest pain with walking and is now scheduled for LHC on Thursday. Eliquis is on hold for cath, explained to pt that we do not like to interrupt anticoagulation after cardioversion x 30 days as this does increase his stroke risk He does have sleep apnea, as witnessed by wife, but has improved sleeping in a recliner, defers sleep study today He is feeling much better with Imdur that was started yesterday  2. LV dysfunction No unusual shortness of breath Currently weight is stable Continue BB/ARB/HCTZ Pending establishing with Dr. [name] 8/12  F/u in afib clinic in 3 weeks Will know the results of the cath at that point and he will have been back on anticoagulation as he will need to have uninterrupted anticoagulation x 3 weeks to start antiarrythmic   Addendum- 4 pm- 7/9- I spoke to [name] about the increased risk of stroke stopping anticoagulant  right after cardioversion. He is feeling so much better after the addition of Imdur. She decided to delay the cath for several more weeks. Pt was called ant told to restart anticoagulation( missed this am dose only) and  let Suanne Marker know if return of chest pain. Otherwise f/u with her 7/29.  Geroge Baseman Kalil Woessner, [facility] [address] [place],  [address] [phone]

## 2017-12-03 NOTE — Addendum Note (Signed)
Encounter addended by: Sherran Needs, NP on: 12/03/2017 4:26 PM  Actions taken: Sign clinical note

## 2017-12-05 ENCOUNTER — Ambulatory Visit (HOSPITAL_COMMUNITY): Admission: RE | Admit: 2017-12-05 | Payer: PPO | Source: Ambulatory Visit | Admitting: Cardiovascular Disease

## 2017-12-05 ENCOUNTER — Encounter (HOSPITAL_COMMUNITY): Admission: RE | Payer: Self-pay | Source: Ambulatory Visit

## 2017-12-05 SURGERY — LEFT HEART CATH AND CORONARY ANGIOGRAPHY
Anesthesia: LOCAL

## 2017-12-18 ENCOUNTER — Telehealth: Payer: Self-pay | Admitting: Physician Assistant

## 2017-12-18 ENCOUNTER — Ambulatory Visit (INDEPENDENT_AMBULATORY_CARE_PROVIDER_SITE_OTHER): Payer: PPO | Admitting: Physician Assistant

## 2017-12-18 ENCOUNTER — Encounter: Payer: Self-pay | Admitting: Physician Assistant

## 2017-12-18 VITALS — BP 138/76 | HR 91 | Ht 69.0 in | Wt 183.0 lb

## 2017-12-18 DIAGNOSIS — I200 Unstable angina: Secondary | ICD-10-CM | POA: Diagnosis not present

## 2017-12-18 DIAGNOSIS — I5022 Chronic systolic (congestive) heart failure: Secondary | ICD-10-CM | POA: Diagnosis not present

## 2017-12-18 DIAGNOSIS — I4819 Other persistent atrial fibrillation: Secondary | ICD-10-CM

## 2017-12-18 DIAGNOSIS — I481 Persistent atrial fibrillation: Secondary | ICD-10-CM

## 2017-12-18 DIAGNOSIS — I10 Essential (primary) hypertension: Secondary | ICD-10-CM | POA: Diagnosis not present

## 2017-12-18 MED ORDER — CARVEDILOL 6.25 MG PO TABS: 6.2500 mg | ORAL_TABLET | Freq: Two times a day (BID) | ORAL | 1 refills | Status: DC

## 2017-12-18 NOTE — Progress Notes (Signed)
Cardiology Office Note   Date:  12/18/2017   ID:  Dillon Young, DOB Oct 19, 1932, MRN [id number]  PCP:  [name], MD  Cardiologist: Dr. [name], 11/06/2017 [name], PA-C 12/02/2017  Chief Complaint  Patient presents with  . Follow-up  . Chest Pain    Discomfort.    History of Present Illness: Dillon Young is a 84 y.o. male with a history of  PCI CFX [place], HTN, [place], R-CEA 2013, Cath 12/2015 w/ DES RCA, RI 90% and LAD 70%>>med rx, Afib dx 09/2017, EF now 30-35%, on Eliquis/ASA and off Plavix, probable sleep apnea but does not feel he would tolerate CPAP, sleeps in a recliner, CHA2DS2-VASc = 5 (age x 2, CAD, HTN, CHF)  07/02 [facility] 7/8 office visit, patient complaining of chest pain, back in Afib, cath scheduled and Imdur added 7/9 A. fib clinic appointment, patient feeling much better on the Imdur and symptoms improved, cath deferred due to recent cardioversion  Dillon Young presents for cardiology follow up.  He is having less chest pain, but is still having it. The pain is from the center to the upper right chest. It is a burning and an aching, almost a pressure. The sx can last a few hours at a time. It is a 2/10 and is associated with nausea, feels a little SOB. Feels the Imdur helped at first, but sx have returned.   He got the sx this am, but they resolved with a SL NTG.    He has occasional palpitations, no presyncope or sycnope.   No LE edema, no PND. Still sleeping in a recliner.   He is compliant w/ medications.  After 2017 cath, problems w/ the nerve in his R leg for several months. Wants Dr [name] to do all future procedures.     Past Medical History:  Diagnosis Date  . Adenomatous colon polyp   . Allergic rhinitis   . BPH (benign prostatic hyperplasia)   . CAD (coronary artery disease)    sees Dr. [name]   . Carotid artery disease ([place])   . CHF (congestive heart failure) ([place])   . Chronic kidney disease    Sones. Prostate  cancer  . Clotting disorder ([place])   . CVA (cerebral infarction)   . Diabetes mellitus    "Borderline"  . Diverticulitis   . GERD (gastroesophageal reflux disease)    history  . Hemorrhoids   . HTN (hypertension)   . Hyperlipidemia LDL goal <70   . IBS (irritable bowel syndrome)   . Low back pain   . Prostate cancer ([place]) 2008   seed implantation  . Sleep apnea    last study was aborted.  Has never worn a CPAP.    Marland Kitchen Smoker     Past Surgical History:  Procedure Laterality Date  . APPENDECTOMY    . CARDIAC CATHETERIZATION N/A 01/16/2016   Procedure: Left Heart Cath and Coronary Angiography;  Surgeon: Jettie Booze, MD;  Location: [place] [facility] LAB;  Service: Cardiovascular;  Laterality: N/A;  . CARDIAC CATHETERIZATION N/A 01/16/2016   Procedure: Coronary Stent Intervention;  Surgeon: Jettie Booze, MD;  Location: [place] [facility] LAB;  Service: Cardiovascular;  Laterality: N/A;  . CARDIOVERSION N/A 11/26/2017   Procedure: CARDIOVERSION;  Surgeon: [name], MD;  Location: [facility] ENDOSCOPY;  Service: Cardiovascular;  Laterality: N/A;  . Cataract with Lens     Bilateral  . COLONOSCOPY  10/21/2008   diverticulosis, radiation  proctitis, internal hemorrhoids  . ENDARTERECTOMY  11/12/2011   Procedure: ENDARTERECTOMY CAROTID;  Surgeon: Rosetta Posner, MD;  Location: [facility] OR;  Service: Vascular;  Laterality: Right;  . Esophageal dilitation    . HEMORRHOID BANDING    . Lithrotripsy      x 3  . LUMBAR LAMINECTOMY    . TRANSURETHRAL RESECTION OF PROSTATE  10/2003    Current Outpatient Medications  Medication Sig Dispense Refill  . apixaban (ELIQUIS) 5 MG TABS tablet Take 1 tablet (5 mg total) by mouth 2 (two) times daily. 180 tablet 01  . aspirin EC 81 MG tablet Take 81 mg by mouth every evening.     Marland Kitchen azelastine (ASTELIN) 0.1 % nasal spray Place 1 spray into both nostrils 2 (two) times daily. Use in each nostril as directed (Patient taking differently: Place 1 spray into both  nostrils daily as needed for allergies. ) 30 mL 11  . carvedilol (COREG) 6.25 MG tablet Take 0.5 tablets (3.125 mg total) by mouth 2 (two) times daily. 180 tablet 1  . cholecalciferol (VITAMIN D) 1000 UNITS tablet Take 1,000 Units by mouth at bedtime.     . fluticasone (FLONASE) 50 MCG/ACT nasal spray Place 1-2 sprays into both nostrils daily as needed for allergies. 48 g 1  . hydrocortisone (ANUSOL-HC) 2.5 % rectal cream Place 1 application rectally 2 (two) times daily as needed for hemorrhoids.     . Hypromellose (ARTIFICIAL TEARS OP) Place 1 drop into both eyes 4 (four) times daily as needed (dry eyes).    . irbesartan-hydrochlorothiazide (AVALIDE) 300-12.5 MG tablet Take 1 tablet by mouth daily. 90 tablet 3  . isosorbide mononitrate (IMDUR) 30 MG 24 hr tablet Take 1 tablet (30 mg total) by mouth daily. 30 tablet 5  . Melatonin 5 MG CAPS Take 1 capsule (5 mg total) by mouth daily as needed (sleep). (Patient taking differently: Take 1 capsule by mouth at bedtime as needed (sleep). )    . nitroGLYCERIN (NITROSTAT) 0.4 MG SL tablet Place 1 tablet (0.4 mg total) under the tongue every 5 (five) minutes as needed for chest pain. 25 tablet 3  . rosuvastatin (CRESTOR) 10 MG tablet TAKE 1 TABLET BY MOUTH EVERY DAY (Patient taking differently: TAKE 1 TABLET BY MOUTH EVERY DAY at bedtime.) 90 tablet 3  . vitamin C (ASCORBIC ACID) 500 MG tablet Take 500 mg by mouth at bedtime.     . Wheat Dextrin (BENEFIBER PO) Take 2 Scoops by mouth daily.      No current facility-administered medications for this visit.     Allergies:   Lipitor [atorvastatin]    Social History:  The patient  reports that he quit smoking about 9 years ago. His smoking use included cigarettes. He has a 90.00 pack-year smoking history. He has never used smokeless tobacco. He reports that he drinks alcohol. He reports that he does not use drugs.   Family History:  Family History  Problem Relation Age of Onset  . Heart disease Father     . Hypertension Mother   . Colon cancer Neg Hx   . Esophageal cancer Neg Hx   . Stomach cancer Neg Hx   . Rectal cancer Neg Hx   . Liver cancer Neg Hx    Family Status  Relation Name Status  . Father  Deceased  . Mother  Deceased  . Neg Hx  (Not Specified)    ROS:  Please see the history of present illness. All other systems are  reviewed and negative.    PHYSICAL EXAM: VS:  BP 138/76 (BP Location: Left Arm, Patient Position: Sitting, Cuff Size: Normal)   Pulse 91   Ht 5\' 9"  (1.753 m)   Wt 183 lb (83 kg)   BMI 27.02 kg/m  , BMI Body mass index is 27.02 kg/m. GEN: Well nourished, well developed, male in no acute distress  HEENT: normal for age  Neck: R>L Carotid bruits, no JVD, no masses Cardiac: Irreg R&R; no murmur, no rubs, or gallops Respiratory: slightly decreased BS bases bilaterally, normal work of breathing GI: soft, nontender, nondistended, + BS MS: no deformity or atrophy; no edema; distal pulses are 2+ in all 4 extremities   Skin: warm and dry, no rash Neuro:  Strength and sensation are intact Psych: euthymic mood, full affect   EKG:  EKG is ordered today. The ekg ordered today demonstrates atrial fib, HR 91, no change in intervals.   CAROTID DOPPLERS: 06/13/2017, repeat in 1 year Final Interpretation: Right Carotid: Velocities in the right ICA are consistent with a 1-39% stenosis. Left Carotid: Velocities in the left ICA are consistent with a 40-59% stenosis.       Non-hemodynamically significant plaque noted in the CCA. Vertebrals: Both vertebral arteries were patent with antegrade flow. Subclavians: Normal flow hemodynamics were seen in bilateral subclavian       arteries.  CATH: 01/16/2016  Eccentric Mid LAD lesion, 70 %stenosed in some views.  Ramus lesion, 90 %stenosed. Very small vessel, not suitable for intervention.  Ost 1st Mrg to 1st Mrg lesion, 20 %stenosed.  Mid RCA lesion, 90 %stenosed. A STENT SYNERGY DES 3.5X28 drug  eluting stent was successfully placed.  Post intervention, there is a 0% residual stenosis.  LV end diastolic pressure is normal. Continue dual antiplatelet therapy for at least a year. We did not address the LAD due to significant pain and bradycardia during the RCA PCI. This resolved at the end of the procedure. D/w Dr. [name] who will decide on further intervention.  Post-Intervention Diagram         ECHO: 10/07/2017 - Left ventricle: The cavity size was severely dilated. Systolic function was moderately to severely reduced. The estimated ejection fraction was in the range of 30% to 35%. There is hypokinesis of the lateral myocardium. There is akinesis of the inferolateral and apical myocardium. The study was not technically sufficient to allow evaluation of LV diastolic dysfunction due to atrial fibrillation. - Aorta: Maximal diameter of abdominal aorta: 41 mm. - Abdominal aorta: The abdominal aorta was moderately dilated. - Tricuspid valve: There was trivial regurgitation. - Pulmonic valve: There was trivial regurgitation.   Recent Labs: 10/02/2017: ALT 13 11/20/2017: Hemoglobin 15.7; Magnesium 2.1; Platelets 176; TSH 2.346 12/02/2017: BUN 20; Creatinine, Ser 1.24; Potassium 5.0; Sodium 142    Lipid Panel    Component Value Date/Time   CHOL 121 10/02/2017 1011   TRIG 127 10/02/2017 1011   HDL 46 10/02/2017 1011   CHOLHDL 2.6 10/02/2017 1011   CHOLHDL 2.8 07/27/2016 0912   VLDL 25 07/27/2016 0912   LDLCALC 50 10/02/2017 1011     Wt Readings from Last 3 Encounters:  12/18/17 183 lb (83 kg)  12/03/17 184 lb (83.5 kg)  12/02/17 182 lb (82.6 kg)     Other studies Reviewed: Additional studies/ records that were reviewed today include: office notes, hospital records and testing.  ASSESSMENT AND PLAN:  1.  Progressive angina: His symptoms initially improved on a low dose of Imdur, but are  again progressing. - Echocardiogram from May 2019 showed a new  wall motion abnormality - The risks and benefits of a cardiac catheterization including, but not limited to, death, stroke, MI, kidney damage and bleeding were discussed with the patient and his wife, they indicate understanding and agree to proceed. -Dr. [name] is in the Cath Lab next week, the procedure will be scheduled with him - Increase the Imdur to 60 mg daily  2. Chronic anticoagulation This patients CHA2DS2-VASc Score and unadjusted Ischemic Stroke Rate (% per year) is equal to 7.2 % stroke rate/year from a score of 5 Above score calculated as 1 point each if present [CHF, HTN, DM, Vascular=MI/PAD/Aortic Plaque, Age if 65-74, or Male], 2 points each if present [Age > 75, or Stroke/TIA/TE] -It is now been more than 3 weeks since his cardioversion on 7/2, okay to hold the Eliquis if needed --Hold the Eliquis for 2 days prior to the procedure and then restart as soon as possible after it  3.  Persistent atrial fibrillation: His heart rate was 91 on the ECG and when I was listening to his heart, it seemed to be going faster.  At one point, the carvedilol was decreased because of blood pressure issues but I believe he will tolerate it, go back up to 6.25 mg twice daily.  4.  Chronic systolic CHF: He has no volume overload by exam.  His weight is stable.  He would benefit from [facility] but for his atrial fib and ischemic symptoms, the Imdur and carvedilol are more important right now.  Continue irbesartan/HCTZ as well   Current medicines are reviewed at length with the patient today.  The patient does not have concerns regarding medicines.  The following changes have been made: Increase Imdur and carvedilol  Labs/ tests ordered today include:   Orders Placed This Encounter  Procedures  . Basic metabolic panel  . CBC with Differential/Platelet  . Protime-INR  . APTT  . EKG 12-Lead     Disposition:   FU with Dr. [name], the A Fib clinic and Dr. [name]  Signed, [name], PA-C    12/18/2017 10:41 AM    [place] Phone: [phone]; Fax: [phone]  This note was written with the assistance of speech recognition software. Please excuse any transcriptional errors.

## 2017-12-18 NOTE — Telephone Encounter (Signed)
Disregard opened in error °

## 2017-12-18 NOTE — H&P (Signed)
Cardiology History and Physical   Date:  12/18/2017   ID:  [name], DOB 1932/10/26, MRN [id number]  PCP:  [name], MD   Cardiologist: Dr. [name], 11/06/2017 [name], PA-C 12/02/2017      Chief Complaint  Patient presents with  . Follow-up  . Chest Pain    Discomfort.    History of Present Illness: Dillon Young is a 84 y.o. male with a history of PCI CFX [place], HTN, [place], R-CEA 2013, Cath 12/2015 w/ DES RCA, RI 90% and LAD 70%>>med rx, Afib dx 09/2017, EF now 30-35%, on Eliquis/ASA and off Plavix,probable sleep apnea but does not feel he would tolerate CPAP, sleeps in a recliner, CHA2DS2-VASc = 5 (age x 2, CAD, HTN, CHF)  07/02 DCCV 7/8 office visit, patient complaining of chest pain, back in Afib, cath scheduled and Imdur added 7/9 A. fib clinic appointment, patient feeling much better on the Imdur and symptoms improved, cath deferred due to recent cardioversion  Dillon Young presents for cardiology follow up.  He is having less chest pain, but is still having it. The pain is from the center to the upper right chest. It is a burning and an aching, almost a pressure. The sx can last a few hours at a time. It is a 2/10 and is associated with nausea, feels a little SOB. Feels the Imdur helped at first, but sx have returned.   He got the sx this am, but they resolved with a SL NTG.    He has occasional palpitations, no presyncope or sycnope.   No LE edema, no PND. Still sleeping in a recliner.   He is compliant w/ medications.  After 2017 cath, problems w/ the nerve in his R leg for several months. Wants Dr [name] to do all future procedures.         Past Medical History:  Diagnosis Date  . Adenomatous colon polyp   . Allergic rhinitis   . BPH (benign prostatic hyperplasia)   . CAD (coronary artery disease)    sees Dr. [name]   . Carotid artery disease ([place])   . CHF (congestive heart failure) ([place])     . Chronic kidney disease    [place]. Prostate cancer  . Clotting disorder ([place])   . CVA (cerebral infarction)   . Diabetes mellitus    "Borderline"  . Diverticulitis   . GERD (gastroesophageal reflux disease)    history  . Hemorrhoids   . HTN (hypertension)   . Hyperlipidemia LDL goal <70   . IBS (irritable bowel syndrome)   . Low back pain   . Prostate cancer ([place]) 2008   seed implantation  . Sleep apnea    last study was aborted.  Has never worn a CPAP.    Marland Kitchen Smoker     Past Surgical History:  Procedure Laterality Date  . APPENDECTOMY    . CARDIAC CATHETERIZATION N/A 01/16/2016   Procedure: Left Heart Cath and Coronary Angiography;  Surgeon: Jettie Booze, MD;  Location: [place];  Service: Cardiovascular;  Laterality: N/A;  . CARDIAC CATHETERIZATION N/A 01/16/2016   Procedure: Coronary Stent Intervention;  Surgeon: Jettie Booze, MD;  Location: [facility];  Service: Cardiovascular;  Laterality: N/A;  . CARDIOVERSION N/A 11/26/2017   Procedure: CARDIOVERSION;  Surgeon: [name], MD;  Location: [facility] ENDOSCOPY;  Service: Cardiovascular;  Laterality: N/A;  . Cataract with Lens     Bilateral  .  COLONOSCOPY  10/21/2008   diverticulosis, radiation proctitis, internal hemorrhoids  . ENDARTERECTOMY  11/12/2011   Procedure: ENDARTERECTOMY CAROTID;  Surgeon: Rosetta Posner, MD;  Location: [facility];  Service: Vascular;  Laterality: Right;  . Esophageal dilitation    . HEMORRHOID BANDING    . Lithrotripsy      x 3  . LUMBAR LAMINECTOMY    . TRANSURETHRAL RESECTION OF PROSTATE  10/2003          Current Outpatient Medications  Medication Sig Dispense Refill  . apixaban (ELIQUIS) 5 MG TABS tablet Take 1 tablet (5 mg total) by mouth 2 (two) times daily. 180 tablet 01  . aspirin EC 81 MG tablet Take 81 mg by mouth every evening.     Marland Kitchen azelastine (ASTELIN) 0.1 % nasal spray Place 1 spray into both nostrils 2 (two)  times daily. Use in each nostril as directed (Patient taking differently: Place 1 spray into both nostrils daily as needed for allergies. ) 30 mL 11  . carvedilol (COREG) 6.25 MG tablet Take 0.5 tablets (3.125 mg total) by mouth 2 (two) times daily. 180 tablet 1  . cholecalciferol (VITAMIN D) 1000 UNITS tablet Take 1,000 Units by mouth at bedtime.     . fluticasone (FLONASE) 50 MCG/ACT nasal spray Place 1-2 sprays into both nostrils daily as needed for allergies. 48 g 1  . hydrocortisone (ANUSOL-HC) 2.5 % rectal cream Place 1 application rectally 2 (two) times daily as needed for hemorrhoids.     . Hypromellose (ARTIFICIAL TEARS OP) Place 1 drop into both eyes 4 (four) times daily as needed (dry eyes).    . irbesartan-hydrochlorothiazide (AVALIDE) 300-12.5 MG tablet Take 1 tablet by mouth daily. 90 tablet 3  . isosorbide mononitrate (IMDUR) 30 MG 24 hr tablet Take 1 tablet (30 mg total) by mouth daily. 30 tablet 5  . Melatonin 5 MG CAPS Take 1 capsule (5 mg total) by mouth daily as needed (sleep). (Patient taking differently: Take 1 capsule by mouth at bedtime as needed (sleep). )    . nitroGLYCERIN (NITROSTAT) 0.4 MG SL tablet Place 1 tablet (0.4 mg total) under the tongue every 5 (five) minutes as needed for chest pain. 25 tablet 3  . rosuvastatin (CRESTOR) 10 MG tablet TAKE 1 TABLET BY MOUTH EVERY DAY (Patient taking differently: TAKE 1 TABLET BY MOUTH EVERY DAY at bedtime.) 90 tablet 3  . vitamin C (ASCORBIC ACID) 500 MG tablet Take 500 mg by mouth at bedtime.     . Wheat Dextrin (BENEFIBER PO) Take 2 Scoops by mouth daily.      No current facility-administered medications for this visit.     Allergies:   Lipitor [atorvastatin]    Social History:  The patient  reports that he quit smoking about 9 years ago. His smoking use included cigarettes. He has a 90.00 pack-year smoking history. He has never used smokeless tobacco. He reports that he drinks alcohol. He reports that he  does not use drugs.   Family History:       Family History  Problem Relation Age of Onset  . Heart disease Father   . Hypertension Mother   . Colon cancer Neg Hx   . Esophageal cancer Neg Hx   . Stomach cancer Neg Hx   . Rectal cancer Neg Hx   . Liver cancer Neg Hx         Family Status  Relation Name Status  . Father  Deceased  . Mother  Deceased  .  Neg Hx  (Not Specified)    ROS:  Please see the history of present illness. All other systems are reviewed and negative.    PHYSICAL EXAM: VS:  BP 138/76 (BP Location: Left Arm, Patient Position: Sitting, Cuff Size: Normal)   Pulse 91   Ht 5\' 9"  (1.753 m)   Wt 183 lb (83 kg)   BMI 27.02 kg/m  , BMI Body mass index is 27.02 kg/m. GEN: Well nourished, well developed, male in no acute distress  HEENT: normal for age  Neck: R>L Carotid bruits, no JVD, no masses Cardiac: Irreg R&R; no murmur, no rubs, or gallops Respiratory: slightly decreased BS bases bilaterally, normal work of breathing GI: soft, nontender, nondistended, + BS MS: no deformity or atrophy; no edema; distal pulses are 2+ in all 4 extremities   Skin: warm and dry, no rash Neuro:  Strength and sensation are intact Psych: euthymic mood, full affect   EKG:  EKG is ordered today. The ekg ordered today demonstrates atrial fib, HR 91, no change in intervals.   CAROTID DOPPLERS: 06/13/2017, repeat in 1 year Final Interpretation: Right Carotid: Velocities in the right ICA are consistent with a 1-39% stenosis. Left Carotid: Velocities in the left ICA are consistent with a 40-59% stenosis. Non-hemodynamically significant plaque noted in the CCA. Vertebrals: Both vertebral arteries were patent with antegrade flow. Subclavians: Normal flow hemodynamics were seen in bilateral subclavian arteries.  CATH: 01/16/2016  Eccentric Mid LAD lesion, 70 %stenosed in some views.  Ramus lesion, 90 %stenosed. Very small vessel,  not suitable for intervention.  Ost 1st Mrg to 1st Mrg lesion, 20 %stenosed.  Mid RCA lesion, 90 %stenosed. A STENT SYNERGY DES 3.5X28 drug eluting stent was successfully placed.  Post intervention, there is a 0% residual stenosis.  LV end diastolic pressure is normal. Continue dual antiplatelet therapy for at least a year. We did not address the LAD due to significant pain and bradycardia during the RCA PCI. This resolved at the end of the procedure. D/w Dr. [name] who will decide on further intervention. Post-Intervention Diagram         ECHO: 10/07/2017 - Left ventricle: The cavity size was severely dilated. Systolic function was moderately to severely reduced. The estimated ejection fraction was in the range of 30% to 35%. There is hypokinesis of the lateral myocardium. There is akinesis of the inferolateral and apical myocardium. The study was not technically sufficient to allow evaluation of LV diastolic dysfunction due to atrial fibrillation. - Aorta: Maximal diameter of abdominal aorta: 41 mm. - Abdominal aorta: The abdominal aorta was moderately dilated. - Tricuspid valve: There was trivial regurgitation. - Pulmonic valve: There was trivial regurgitation.   Recent Labs: 10/02/2017: ALT 13 11/20/2017: Hemoglobin 15.7; Magnesium 2.1; Platelets 176; TSH 2.346 12/02/2017: BUN 20; Creatinine, Ser 1.24; Potassium 5.0; Sodium 142    Lipid Panel Labs(Brief)          Component Value Date/Time   CHOL 121 10/02/2017 1011   TRIG 127 10/02/2017 1011   HDL 46 10/02/2017 1011   CHOLHDL 2.6 10/02/2017 1011   CHOLHDL 2.8 07/27/2016 0912   VLDL 25 07/27/2016 0912   LDLCALC 50 10/02/2017 1011          Wt Readings from Last 3 Encounters:  12/18/17 183 lb (83 kg)  12/03/17 184 lb (83.5 kg)  12/02/17 182 lb (82.6 kg)     Other studies Reviewed: Additional studies/ records that were reviewed today include: office notes, hospital records and  testing.  ASSESSMENT AND PLAN:  1.  Progressive angina: His symptoms initially improved on a low dose of Imdur, but are again progressing. - Echocardiogram from May 2019 showed a new wall motion abnormality - The risks and benefits of a cardiac catheterization including, but not limited to, death, stroke, MI, kidney damage and bleeding were discussed with the patient and his wife, theyindicate understanding and agreeto proceed. -Dr. [name] is in the Cath Lab next week, the procedure will be scheduled with him - Increase the Imdur to 60 mg daily  2. Chronic anticoagulation This patients CHA2DS2-VASc Score and unadjusted Ischemic Stroke Rate (% per year) is equal to 7.2 % stroke rate/year from a score of 5 Above score calculated as 1 point each if present [CHF, HTN, DM, Vascular=MI/PAD/Aortic Plaque, Age if 65-74, or Male], 2 points each if present [Age > 75, or Stroke/TIA/TE] -It is now been more than 3 weeks since his cardioversion on 7/2, okay to hold the Eliquis if needed --Hold the Eliquis for 2 days prior to the procedure and then restart as soon as possible after it  3.  Persistent atrial fibrillation: His heart rate was 91 on the ECG and when I was listening to his heart, it seemed to be going faster.  At one point, the carvedilol was decreased because of blood pressure issues but I believe he will tolerate it, go back up to 6.25 mg twice daily.  4.  Chronic systolic CHF: He has no volume overload by exam.  His weight is stable.  He would benefit from [facility] but for his atrial fib and ischemic symptoms, the Imdur and carvedilol are more important right now.  Continue irbesartan/HCTZ as well   Current medicines are reviewed at length with the patient today.  The patient does not have concerns regarding medicines.  The following changes have been made: Increase Imdur and carvedilol  Labs/ tests ordered today include:      Orders Placed This Encounter  Procedures  .  Basic metabolic panel  . CBC with Differential/Platelet  . Protime-INR  . APTT  . EKG 12-Lead     Disposition:   FU with Dr. [name], the A Fib clinic and Dr. [name]  Signed, [name], PA-C  12/18/2017 10:41 AM    [place] Phone: [phone]; Fax: [phone]  This note was written with the assistance of speech recognition software. Please excuse any transcriptional errors.

## 2017-12-18 NOTE — Patient Instructions (Addendum)
    [place] [address] [place] [address] Dept: [phone] Loc: [place]  12/18/2017  You are scheduled for a Cardiac Catheterization on Thursday, August 1 with Dr. [name].  1. Please arrive at the [facility] (Main Entrance A) at [facility]: [address] [place], [place] [address] at 10:30 AM (This time is two hours before your procedure to ensure your preparation). Free valet parking service is available.   Special note: Every effort is made to have your procedure done on time. Please understand that emergencies sometimes delay scheduled procedures.  2. Diet: Do not eat solid foods after midnight.  The patient may have clear liquids until 5am upon the day of the procedure.  3. Labs: You will need to have blood drawn today in our office.  4. Medication instructions in preparation for your procedure:  STOP Eliquis on Tuesday July 30.  Increase carvedilol to 1 tablet twice daily.  On the morning of your procedure, take your Aspirin and any morning medicines NOT listed above.  You may use sips of water.  5. Plan for one night stay--bring personal belongings. 6. Bring a current list of your medications and current insurance cards. 7. You MUST have a responsible person to drive you home. 8. Someone MUST be with you the first 24 hours after you arrive home or your discharge will be delayed. 9. Please wear clothes that are easy to get on and off and wear slip-on shoes.  Thank you for allowing [place] to care for you!   -- [place] [facility]

## 2017-12-19 ENCOUNTER — Encounter: Payer: Self-pay | Admitting: Physician Assistant

## 2017-12-19 LAB — BASIC METABOLIC PANEL
BUN / CREAT RATIO: 19 (ref 10–24)
BUN: 19 mg/dL (ref 8–27)
CO2: 29 mmol/L (ref 20–29)
Calcium: 10.1 mg/dL (ref 8.6–10.2)
Chloride: 101 mmol/L (ref 96–106)
Creatinine, Ser: 0.99 mg/dL (ref 0.76–1.27)
GFR, EST AFRICAN AMERICAN: 81 mL/min/{1.73_m2} (ref >59)
GFR, EST NON AFRICAN AMERICAN: 70 mL/min/{1.73_m2} (ref >59)
Glucose: 123 mg/dL — ABNORMAL HIGH (ref 65–99)
POTASSIUM: 4.8 mmol/L (ref 3.5–5.2)
SODIUM: 144 mmol/L (ref 134–144)

## 2017-12-19 LAB — CBC WITH DIFFERENTIAL/PLATELET
Basophils Absolute: 0 10*3/uL (ref 0.0–0.2)
Basos: 0 %
EOS (ABSOLUTE): 0.1 10*3/uL (ref 0.0–0.4)
Eos: 2 %
Hematocrit: 48.3 % (ref 37.5–51.0)
Hemoglobin: 15.3 g/dL (ref 13.0–17.7)
Immature Grans (Abs): 0 10*3/uL (ref 0.0–0.1)
Immature Granulocytes: 0 %
Lymphocytes Absolute: 1 10*3/uL (ref 0.7–3.1)
Lymphs: 18 %
MCH: 27.5 pg (ref 26.6–33.0)
MCHC: 31.7 g/dL (ref 31.5–35.7)
MCV: 87 fL (ref 79–97)
MONOS ABS: 0.6 10*3/uL (ref 0.1–0.9)
Monocytes: 10 %
NEUTROS ABS: 3.9 10*3/uL (ref 1.4–7.0)
Neutrophils: 70 %
PLATELETS: 185 10*3/uL (ref 150–450)
RBC: 5.56 x10E6/uL (ref 4.14–5.80)
RDW: 14.5 % (ref 12.3–15.4)
WBC: 5.7 10*3/uL (ref 3.4–10.8)

## 2017-12-19 LAB — PROTIME-INR
INR: 1.1 (ref 0.8–1.2)
Prothrombin Time: 11.8 s (ref 9.1–12.0)

## 2017-12-19 LAB — APTT: aPTT: 32 s (ref 24–33)

## 2017-12-19 NOTE — Telephone Encounter (Signed)
Patient wanted to let his provider know that in the past, he was diagnosed with allergic rhinitis. He said that he failed to mention this to his providers. Patient also wanted to have his family history updated with his PGM having a h/o of asthma. Family history updated.

## 2017-12-20 ENCOUNTER — Ambulatory Visit: Payer: PPO | Admitting: Family

## 2017-12-23 ENCOUNTER — Ambulatory Visit: Payer: PPO | Admitting: Physician Assistant

## 2017-12-24 ENCOUNTER — Telehealth: Payer: Self-pay | Admitting: *Deleted

## 2017-12-24 NOTE — Telephone Encounter (Signed)
Pt contacted pre-catheterization scheduled at [facility] for: Thursday December 26, 2017 12:30 PM Verified arrival time and place: [place] Entrance A at: 10:30 AM  No solid food after midnight prior to cath, clear liquids until 5 AM day of procedure. Verified allergies in Epic  Hold: Apixaban -12/24/17 until post procedure. Irbesartan-HCTZ AM of procedure.  Except hold medications AM meds can be  taken pre-cath with sip of water including: ASA 81 mg  Confirmed patient has responsible person to drive home post procedure and for 24 hours after you arrive home: yes

## 2017-12-26 ENCOUNTER — Encounter (HOSPITAL_COMMUNITY)
Admission: RE | Disposition: A | Discharge status: Home / Self Care | Payer: Self-pay | Source: Ambulatory Visit | Attending: Cardiovascular Disease

## 2017-12-26 ENCOUNTER — Ambulatory Visit (HOSPITAL_COMMUNITY)
Admission: RE | Admit: 2017-12-26 | Discharge: 2017-12-27 | Disposition: A | Discharge status: Home / Self Care | Payer: PPO | Source: Ambulatory Visit | Attending: Cardiovascular Disease | Admitting: Cardiovascular Disease

## 2017-12-26 ENCOUNTER — Other Ambulatory Visit: Payer: Self-pay

## 2017-12-26 ENCOUNTER — Encounter (HOSPITAL_COMMUNITY): Payer: Self-pay

## 2017-12-26 DIAGNOSIS — Z7902 Long term (current) use of antithrombotics/antiplatelets: Secondary | ICD-10-CM | POA: Diagnosis not present

## 2017-12-26 DIAGNOSIS — Z800 Family history of malignant neoplasm of digestive organs: Secondary | ICD-10-CM | POA: Insufficient documentation

## 2017-12-26 DIAGNOSIS — G4730 Sleep apnea, unspecified: Secondary | ICD-10-CM | POA: Diagnosis not present

## 2017-12-26 DIAGNOSIS — K589 Irritable bowel syndrome without diarrhea: Secondary | ICD-10-CM | POA: Insufficient documentation

## 2017-12-26 DIAGNOSIS — Z86010 Personal history of colonic polyps: Secondary | ICD-10-CM | POA: Insufficient documentation

## 2017-12-26 DIAGNOSIS — N400 Enlarged prostate without lower urinary tract symptoms: Secondary | ICD-10-CM | POA: Diagnosis not present

## 2017-12-26 DIAGNOSIS — Z8546 Personal history of malignant neoplasm of prostate: Secondary | ICD-10-CM | POA: Diagnosis not present

## 2017-12-26 DIAGNOSIS — K219 Gastro-esophageal reflux disease without esophagitis: Secondary | ICD-10-CM | POA: Diagnosis not present

## 2017-12-26 DIAGNOSIS — Z79899 Other long term (current) drug therapy: Secondary | ICD-10-CM | POA: Diagnosis not present

## 2017-12-26 DIAGNOSIS — Z7901 Long term (current) use of anticoagulants: Secondary | ICD-10-CM | POA: Insufficient documentation

## 2017-12-26 DIAGNOSIS — Z9889 Other specified postprocedural states: Secondary | ICD-10-CM | POA: Diagnosis not present

## 2017-12-26 DIAGNOSIS — R079 Chest pain, unspecified: Secondary | ICD-10-CM | POA: Diagnosis present

## 2017-12-26 DIAGNOSIS — N189 Chronic kidney disease, unspecified: Secondary | ICD-10-CM | POA: Insufficient documentation

## 2017-12-26 DIAGNOSIS — I481 Persistent atrial fibrillation: Secondary | ICD-10-CM | POA: Diagnosis not present

## 2017-12-26 DIAGNOSIS — I130 Hypertensive heart and chronic kidney disease with heart failure and stage 1 through stage 4 chronic kidney disease, or unspecified chronic kidney disease: Secondary | ICD-10-CM | POA: Insufficient documentation

## 2017-12-26 DIAGNOSIS — Z7982 Long term (current) use of aspirin: Secondary | ICD-10-CM | POA: Insufficient documentation

## 2017-12-26 DIAGNOSIS — Z87891 Personal history of nicotine dependence: Secondary | ICD-10-CM | POA: Insufficient documentation

## 2017-12-26 DIAGNOSIS — Z981 Arthrodesis status: Secondary | ICD-10-CM | POA: Insufficient documentation

## 2017-12-26 DIAGNOSIS — E1122 Type 2 diabetes mellitus with diabetic chronic kidney disease: Secondary | ICD-10-CM | POA: Insufficient documentation

## 2017-12-26 DIAGNOSIS — E785 Hyperlipidemia, unspecified: Secondary | ICD-10-CM | POA: Insufficient documentation

## 2017-12-26 DIAGNOSIS — I25119 Atherosclerotic heart disease of native coronary artery with unspecified angina pectoris: Secondary | ICD-10-CM | POA: Insufficient documentation

## 2017-12-26 DIAGNOSIS — I2510 Atherosclerotic heart disease of native coronary artery without angina pectoris: Secondary | ICD-10-CM | POA: Diagnosis not present

## 2017-12-26 DIAGNOSIS — Z9861 Coronary angioplasty status: Secondary | ICD-10-CM

## 2017-12-26 DIAGNOSIS — Z8673 Personal history of transient ischemic attack (TIA), and cerebral infarction without residual deficits: Secondary | ICD-10-CM | POA: Diagnosis not present

## 2017-12-26 DIAGNOSIS — I5022 Chronic systolic (congestive) heart failure: Secondary | ICD-10-CM | POA: Diagnosis not present

## 2017-12-26 DIAGNOSIS — Z8249 Family history of ischemic heart disease and other diseases of the circulatory system: Secondary | ICD-10-CM | POA: Diagnosis not present

## 2017-12-26 DIAGNOSIS — Z955 Presence of coronary angioplasty implant and graft: Secondary | ICD-10-CM | POA: Insufficient documentation

## 2017-12-26 HISTORY — PX: CORONARY STENT INTERVENTION: CATH118234

## 2017-12-26 HISTORY — PX: LEFT HEART CATH AND CORONARY ANGIOGRAPHY: CATH118249

## 2017-12-26 LAB — GLUCOSE, CAPILLARY
GLUCOSE-CAPILLARY: 110 mg/dL — AB (ref 70–99)
GLUCOSE-CAPILLARY: 90 mg/dL (ref 70–99)
Glucose-Capillary: 88 mg/dL (ref 70–99)

## 2017-12-26 LAB — POCT ACTIVATED CLOTTING TIME: Activated Clotting Time: 417 seconds

## 2017-12-26 SURGERY — LEFT HEART CATH AND CORONARY ANGIOGRAPHY
Anesthesia: LOCAL

## 2017-12-26 MED ORDER — HYDROCHLOROTHIAZIDE 12.5 MG PO CAPS
12.5000 mg | ORAL_CAPSULE | Freq: Every day | ORAL | Status: DC
  Administered 2017-12-26 – 2017-12-27 (×2): 12.5 mg via ORAL
  Filled 2017-12-26 (×2): qty 1

## 2017-12-26 MED ORDER — SODIUM CHLORIDE 0.9 % IV SOLN: 250.0000 mL | INTRAVENOUS | Status: DC | PRN

## 2017-12-26 MED ORDER — CARVEDILOL 3.125 MG PO TABS
6.2500 mg | ORAL_TABLET | Freq: Two times a day (BID) | ORAL | Status: DC
  Administered 2017-12-26 – 2017-12-27 (×2): 6.25 mg via ORAL
  Filled 2017-12-26 (×2): qty 2

## 2017-12-26 MED ORDER — SODIUM CHLORIDE 0.9 % WEIGHT BASED INFUSION: 1.0000 mL/kg/h | INTRAVENOUS | Status: DC

## 2017-12-26 MED ORDER — NITROGLYCERIN 0.4 MG SL SUBL: 0.4000 mg | SUBLINGUAL_TABLET | SUBLINGUAL | Status: DC | PRN

## 2017-12-26 MED ORDER — SODIUM CHLORIDE 0.9 % IV SOLN
INTRAVENOUS | Status: AC | PRN
  Administered 2017-12-26: 1.75 mg/kg/h via INTRAVENOUS

## 2017-12-26 MED ORDER — LIDOCAINE HCL (PF) 1 % IJ SOLN
INTRAMUSCULAR | Status: DC | PRN
  Administered 2017-12-26: 2 mL

## 2017-12-26 MED ORDER — LABETALOL HCL 5 MG/ML IV SOLN
10.0000 mg | INTRAVENOUS | Status: AC | PRN
  Filled 2017-12-26: qty 4

## 2017-12-26 MED ORDER — BIVALIRUDIN TRIFLUOROACETATE 250 MG IV SOLR
INTRAVENOUS | Status: AC
  Filled 2017-12-26: qty 250

## 2017-12-26 MED ORDER — MIDAZOLAM HCL 2 MG/2ML IJ SOLN
INTRAMUSCULAR | Status: AC
  Filled 2017-12-26: qty 2

## 2017-12-26 MED ORDER — IRBESARTAN 300 MG PO TABS
300.0000 mg | ORAL_TABLET | Freq: Every day | ORAL | Status: DC
  Administered 2017-12-26 – 2017-12-27 (×2): 300 mg via ORAL
  Filled 2017-12-26 (×2): qty 1

## 2017-12-26 MED ORDER — FENTANYL CITRATE (PF) 100 MCG/2ML IJ SOLN
INTRAMUSCULAR | Status: AC
  Filled 2017-12-26: qty 2

## 2017-12-26 MED ORDER — HYDRALAZINE HCL 20 MG/ML IJ SOLN
INTRAMUSCULAR | Status: DC | PRN
  Administered 2017-12-26: 10 mg via INTRAVENOUS

## 2017-12-26 MED ORDER — SODIUM CHLORIDE 0.9% FLUSH: 3.0000 mL | Freq: Two times a day (BID) | INTRAVENOUS | Status: DC

## 2017-12-26 MED ORDER — FAMOTIDINE IN NACL 20-0.9 MG/50ML-% IV SOLN
INTRAVENOUS | Status: AC
  Filled 2017-12-26: qty 50

## 2017-12-26 MED ORDER — ONDANSETRON HCL 4 MG/2ML IJ SOLN
4.0000 mg | Freq: Four times a day (QID) | INTRAMUSCULAR | Status: DC | PRN
  Administered 2017-12-26: 23:00:00 4 mg via INTRAVENOUS
  Filled 2017-12-26: qty 2

## 2017-12-26 MED ORDER — SODIUM CHLORIDE 0.9% FLUSH
3.0000 mL | Freq: Two times a day (BID) | INTRAVENOUS | Status: DC
  Administered 2017-12-26 – 2017-12-27 (×2): 3 mL via INTRAVENOUS

## 2017-12-26 MED ORDER — SODIUM CHLORIDE 0.9% FLUSH: 3.0000 mL | INTRAVENOUS | Status: DC | PRN

## 2017-12-26 MED ORDER — HEPARIN (PORCINE) IN NACL 1000-0.9 UT/500ML-% IV SOLN
INTRAVENOUS | Status: DC | PRN
  Administered 2017-12-26 (×2): 500 mL

## 2017-12-26 MED ORDER — SODIUM CHLORIDE 0.9 % IV SOLN
INTRAVENOUS | Status: AC
  Administered 2017-12-26: 17:00:00 via INTRAVENOUS

## 2017-12-26 MED ORDER — HYDRALAZINE HCL 20 MG/ML IJ SOLN
INTRAMUSCULAR | Status: AC
  Filled 2017-12-26: qty 1

## 2017-12-26 MED ORDER — IRBESARTAN-HYDROCHLOROTHIAZIDE 300-12.5 MG PO TABS: 1.0000 | ORAL_TABLET | Freq: Every day | ORAL | Status: DC

## 2017-12-26 MED ORDER — IOHEXOL 350 MG/ML SOLN
INTRAVENOUS | Status: DC | PRN
  Administered 2017-12-26: 130 mL via INTRAVENOUS

## 2017-12-26 MED ORDER — ISOSORBIDE MONONITRATE ER 30 MG PO TB24
30.0000 mg | ORAL_TABLET | Freq: Every day | ORAL | Status: DC
  Administered 2017-12-27: 30 mg via ORAL
  Filled 2017-12-26: qty 1

## 2017-12-26 MED ORDER — FAMOTIDINE IN NACL 20-0.9 MG/50ML-% IV SOLN
INTRAVENOUS | Status: AC | PRN
  Administered 2017-12-26: 20 mg via INTRAVENOUS

## 2017-12-26 MED ORDER — MORPHINE SULFATE (PF) 2 MG/ML IV SOLN
2.0000 mg | INTRAVENOUS | Status: DC | PRN
  Administered 2017-12-26 (×2): 2 mg via INTRAVENOUS
  Filled 2017-12-26 (×2): qty 1

## 2017-12-26 MED ORDER — ASPIRIN 81 MG PO CHEW: 81.0000 mg | CHEWABLE_TABLET | ORAL | Status: DC

## 2017-12-26 MED ORDER — CLOPIDOGREL BISULFATE 300 MG PO TABS
ORAL_TABLET | ORAL | Status: DC | PRN
  Administered 2017-12-26: 600 mg via ORAL

## 2017-12-26 MED ORDER — HYDRALAZINE HCL 20 MG/ML IJ SOLN
5.0000 mg | INTRAMUSCULAR | Status: AC | PRN
  Filled 2017-12-26: qty 1

## 2017-12-26 MED ORDER — LIDOCAINE HCL (PF) 1 % IJ SOLN
INTRAMUSCULAR | Status: AC
  Filled 2017-12-26: qty 30

## 2017-12-26 MED ORDER — ASPIRIN EC 81 MG PO TBEC: 81.0000 mg | DELAYED_RELEASE_TABLET | Freq: Every evening | ORAL | Status: DC

## 2017-12-26 MED ORDER — ROSUVASTATIN CALCIUM 10 MG PO TABS
10.0000 mg | ORAL_TABLET | Freq: Every day | ORAL | Status: DC
  Administered 2017-12-26: 10 mg via ORAL
  Filled 2017-12-26: qty 1

## 2017-12-26 MED ORDER — ACETAMINOPHEN 325 MG PO TABS: 650.0000 mg | ORAL_TABLET | ORAL | Status: DC | PRN

## 2017-12-26 MED ORDER — ANGIOPLASTY BOOK
Freq: Once | Status: DC
  Filled 2017-12-26: qty 1

## 2017-12-26 MED ORDER — CLOPIDOGREL BISULFATE 300 MG PO TABS
ORAL_TABLET | ORAL | Status: AC
  Filled 2017-12-26: qty 1

## 2017-12-26 MED ORDER — CLOPIDOGREL BISULFATE 75 MG PO TABS
75.0000 mg | ORAL_TABLET | Freq: Every day | ORAL | Status: DC
  Administered 2017-12-27: 08:00:00 75 mg via ORAL
  Filled 2017-12-26: qty 1

## 2017-12-26 MED ORDER — MIDAZOLAM HCL 2 MG/2ML IJ SOLN
INTRAMUSCULAR | Status: DC | PRN
  Administered 2017-12-26: 1 mg via INTRAVENOUS

## 2017-12-26 MED ORDER — FENTANYL CITRATE (PF) 100 MCG/2ML IJ SOLN
INTRAMUSCULAR | Status: DC | PRN
  Administered 2017-12-26: 25 ug via INTRAVENOUS

## 2017-12-26 MED ORDER — ASPIRIN 81 MG PO CHEW: 81.0000 mg | CHEWABLE_TABLET | Freq: Every day | ORAL | Status: DC

## 2017-12-26 MED ORDER — BIVALIRUDIN BOLUS VIA INFUSION - CUPID
INTRAVENOUS | Status: DC | PRN
  Administered 2017-12-26: 61.2 mg via INTRAVENOUS

## 2017-12-26 MED ORDER — HEPARIN (PORCINE) IN NACL 1000-0.9 UT/500ML-% IV SOLN
INTRAVENOUS | Status: AC
  Filled 2017-12-26: qty 1000

## 2017-12-26 MED ORDER — SODIUM CHLORIDE 0.9 % IV SOLN
1.7500 mg/kg/h | INTRAVENOUS | Status: AC
  Administered 2017-12-26 (×2): 1.75 mg/kg/h via INTRAVENOUS
  Filled 2017-12-26 (×3): qty 250

## 2017-12-26 MED ORDER — HYDRALAZINE HCL 20 MG/ML IJ SOLN: 5.0000 mg | Freq: Once | INTRAMUSCULAR | Status: DC

## 2017-12-26 MED ORDER — SODIUM CHLORIDE 0.9 % WEIGHT BASED INFUSION: 3.0000 mL/kg/h | INTRAVENOUS | Status: DC

## 2017-12-26 SURGICAL SUPPLY — 18 items
BALLN SAPPHIRE 2.0X12 (BALLOONS) ×2
BALLN SAPPHIRE ~~LOC~~ 2.5X12 (BALLOONS) ×1 IMPLANT
BALLOON SAPPHIRE 2.0X12 (BALLOONS) ×1 IMPLANT
CATH INFINITI 5FR MULTPACK ANG (CATHETERS) ×1 IMPLANT
CATH VISTA GUIDE 6FR XBLAD3.0 (CATHETERS) ×1 IMPLANT
CATH VISTA GUIDE 6FR XBLAD3.5 (CATHETERS) ×1 IMPLANT
KIT ENCORE 26 ADVANTAGE (KITS) ×1 IMPLANT
KIT HEART LEFT (KITS) ×2 IMPLANT
KIT HEMO VALVE WATCHDOG (MISCELLANEOUS) ×1 IMPLANT
PACK CARDIAC CATHETERIZATION (CUSTOM PROCEDURE TRAY) ×2 IMPLANT
SHEATH PINNACLE 5F 10CM (SHEATH) ×1 IMPLANT
SHEATH PINNACLE 6F 10CM (SHEATH) ×1 IMPLANT
STENT SYNERGY DES 2.25X16 (Permanent Stent) ×1 IMPLANT
SYR MEDRAD MARK V 150ML (SYRINGE) ×2 IMPLANT
TRANSDUCER W/STOPCOCK (MISCELLANEOUS) ×2 IMPLANT
TUBING CIL FLEX 10 FLL-RA (TUBING) ×2 IMPLANT
WIRE ASAHI PROWATER 180CM (WIRE) ×1 IMPLANT
WIRE EMERALD 3MM-J .035X150CM (WIRE) ×1 IMPLANT

## 2017-12-26 NOTE — Progress Notes (Signed)
Site area: right groin  Site Prior to Removal:  Level 0  Pressure Applied For 25 MINUTES    Minutes Beginning at 22:20  Manual:   Yes.    Patient Status During Pull:  WNL  Post Pull Groin Site:  Level 0  Post Pull Instructions Given:  Yes.    Post Pull Pulses Present:  Yes.    Dressing Applied:  Yes.    Comments:  Pt tolerated well.

## 2017-12-26 NOTE — Interval H&P Note (Signed)
Cath Lab Visit (complete for each Cath Lab visit)  Clinical Evaluation Leading to the Procedure:   ACS: No.  Non-ACS:    Anginal Classification: CCS II  Anti-ischemic medical therapy: Maximal Therapy (2 or more classes of medications)  Non-Invasive Test Results: No non-invasive testing performed  Prior CABG: No previous CABG      History and Physical Interval Note:  12/26/2017 2:52 PM  Dillon Young  has presented today for surgery, with the diagnosis of angina  The various methods of treatment have been discussed with the patient and family. After consideration of risks, benefits and other options for treatment, the patient has consented to  Procedure(s): LEFT HEART CATH AND CORONARY ANGIOGRAPHY (N/A) as a surgical intervention .  The patient's history has been reviewed, patient examined, no change in status, stable for surgery.  I have reviewed the patient's chart and labs.  Questions were answered to the patient's satisfaction.     [name]

## 2017-12-27 ENCOUNTER — Encounter (HOSPITAL_COMMUNITY): Payer: Self-pay | Admitting: Cardiovascular Disease

## 2017-12-27 DIAGNOSIS — N189 Chronic kidney disease, unspecified: Secondary | ICD-10-CM | POA: Diagnosis not present

## 2017-12-27 DIAGNOSIS — Z7901 Long term (current) use of anticoagulants: Secondary | ICD-10-CM | POA: Diagnosis not present

## 2017-12-27 DIAGNOSIS — I130 Hypertensive heart and chronic kidney disease with heart failure and stage 1 through stage 4 chronic kidney disease, or unspecified chronic kidney disease: Secondary | ICD-10-CM | POA: Diagnosis not present

## 2017-12-27 DIAGNOSIS — I5022 Chronic systolic (congestive) heart failure: Secondary | ICD-10-CM | POA: Diagnosis not present

## 2017-12-27 DIAGNOSIS — Z87891 Personal history of nicotine dependence: Secondary | ICD-10-CM | POA: Diagnosis not present

## 2017-12-27 DIAGNOSIS — I25119 Atherosclerotic heart disease of native coronary artery with unspecified angina pectoris: Secondary | ICD-10-CM | POA: Diagnosis not present

## 2017-12-27 DIAGNOSIS — I481 Persistent atrial fibrillation: Secondary | ICD-10-CM | POA: Diagnosis not present

## 2017-12-27 DIAGNOSIS — E785 Hyperlipidemia, unspecified: Secondary | ICD-10-CM | POA: Diagnosis not present

## 2017-12-27 DIAGNOSIS — N400 Enlarged prostate without lower urinary tract symptoms: Secondary | ICD-10-CM | POA: Diagnosis not present

## 2017-12-27 DIAGNOSIS — K589 Irritable bowel syndrome without diarrhea: Secondary | ICD-10-CM | POA: Diagnosis not present

## 2017-12-27 DIAGNOSIS — Z5181 Encounter for therapeutic drug level monitoring: Secondary | ICD-10-CM | POA: Diagnosis not present

## 2017-12-27 DIAGNOSIS — E1122 Type 2 diabetes mellitus with diabetic chronic kidney disease: Secondary | ICD-10-CM | POA: Diagnosis not present

## 2017-12-27 DIAGNOSIS — I25110 Atherosclerotic heart disease of native coronary artery with unstable angina pectoris: Secondary | ICD-10-CM | POA: Diagnosis not present

## 2017-12-27 DIAGNOSIS — K219 Gastro-esophageal reflux disease without esophagitis: Secondary | ICD-10-CM | POA: Diagnosis not present

## 2017-12-27 DIAGNOSIS — G4730 Sleep apnea, unspecified: Secondary | ICD-10-CM | POA: Diagnosis not present

## 2017-12-27 LAB — CBC
HCT: 43 % (ref 39.0–52.0)
Hemoglobin: 13.9 g/dL (ref 13.0–17.0)
MCH: 27.9 pg (ref 26.0–34.0)
MCHC: 32.3 g/dL (ref 30.0–36.0)
MCV: 86.2 fL (ref 78.0–100.0)
PLATELETS: 141 10*3/uL — AB (ref 150–400)
RBC: 4.99 MIL/uL (ref 4.22–5.81)
RDW: 13.5 % (ref 11.5–15.5)
WBC: 6.4 10*3/uL (ref 4.0–10.5)

## 2017-12-27 LAB — GLUCOSE, CAPILLARY: GLUCOSE-CAPILLARY: 91 mg/dL (ref 70–99)

## 2017-12-27 LAB — BASIC METABOLIC PANEL
Anion gap: 7 (ref 5–15)
BUN: 18 mg/dL (ref 8–23)
CALCIUM: 8.5 mg/dL — AB (ref 8.9–10.3)
CHLORIDE: 107 mmol/L (ref 98–111)
CO2: 26 mmol/L (ref 22–32)
CREATININE: 1.14 mg/dL (ref 0.61–1.24)
GFR calc Af Amer: >60 mL/min (ref >60)
GFR calc non Af Amer: 58 mL/min — ABNORMAL LOW (ref >60)
GLUCOSE: 158 mg/dL — AB (ref 70–99)
Potassium: 4.1 mmol/L (ref 3.5–5.1)
Sodium: 140 mmol/L (ref 135–145)

## 2017-12-27 MED ORDER — ASPIRIN EC 81 MG PO TBEC: 81.0000 mg | DELAYED_RELEASE_TABLET | Freq: Every day | ORAL | 0 refills | Status: DC

## 2017-12-27 MED ORDER — HYDROCODONE-ACETAMINOPHEN 5-325 MG PO TABS
1.0000 | ORAL_TABLET | Freq: Once | ORAL | Status: AC
  Administered 2017-12-27: 1 via ORAL
  Filled 2017-12-27: qty 1

## 2017-12-27 MED ORDER — CLOPIDOGREL BISULFATE 75 MG PO TABS: 75.0000 mg | ORAL_TABLET | Freq: Every day | ORAL | 3 refills | Status: DC

## 2017-12-27 NOTE — Discharge Summary (Signed)
Discharge Summary    Patient ID: Dillon Young,  MRN: [id number], DOB/AGE: [birth year] 82 y.o.  Admit date: 12/26/2017 Discharge date: 12/27/2017  Primary Care Provider: [name] Primary Cardiologist: [name], MD  Discharge Diagnoses    Active Problems:   CAD S/P multiple PCIs   CAD (coronary artery disease)   Persistent atrial fibrillation   Chronic systolic CHF   HTN   HLD   Allergies Allergies  Allergen Reactions  . Lipitor [Atorvastatin] Other (See Comments)    myalgias    Diagnostic Studies/Procedures    CORONARY STENT INTERVENTION  LEFT HEART CATH AND CORONARY ANGIOGRAPHY  Conclusion     Previously placed Prox RCA to Mid RCA stent (unknown type) is widely patent.  Prox LAD to Mid LAD lesion is 80% stenosed.  A stent was successfully placed.  Post intervention, there is a 0% residual stenosis.  There is severe left ventricular systolic dysfunction.  LV end diastolic pressure is mildly elevated.  The left ventricular ejection fraction is less than 25% by visual estimate.   IMPRESSION:Dillon Young has a widely patent mid RCA stent with what appears to be a significant mid LAD lesion. I performed PCI and drug-eluting stenting with an excellent result. This may be the culprit lesion responsible for his chest pain. He does have severe LV dysfunction with an EF in the 20 to 25% range which I suspect is nonischemic. Angiomax will continue for 4 hours full dose. The sheath will be removed and pressure held. Patient will be gently hydrated and discharged home in the morning on dual antiplatelet therapy. Apixaban will start back tomorrow and he will continue with on aspirin, Plavix and apixaban for 1 month after which the aspirin will be discontinued.  Diagnostic Diagram       Post-Intervention Diagram         History of Present Illness     84 y.o. male with a history of CAD s/p PCI CFX 1994 &1995, Cath 12/2015 w/ DES RCA, RI  90% and LAD 70%>>med rx, PAF s/p DCCV 10/30/38, Chronic systolic CHF, probable sleep apnea but does not feel he would tolerate CPAP, sleeps in a recliner,HTN, CVA and HLD presented for scheduled cath for progessive angina.   Recent echo showed declined on LVEF to 30-35% by echo from 50-55%. Felt likely due to afib.   Hospital Course     Consultants: None  1. CAD - Cath as above s/p PTCA and DES to LAD. Continue triple therapy for 1 month and then stop ASA. Continue statin.   2. HLD - 10/02/2017: Cholesterol, Total 121; HDL 46; LDL Calculated 50; Triglycerides 127  - Continue statin.   3. Chronic systolic CHF - Dr. [name] felt this is due to atrial fibrillation. Continue coreg and Avapro/HCTZ. Euvolemic. Has follow up in CHF clinic in 2 weeks.   4. Persistent atrial fibrillation - Failed cardioversion 7/2. Rate controlled. Has follow up in afib clinic 8/6.  Resume eliquis today. rate controlled in 70s.    Discharge Vitals Blood pressure (!) 112/51, pulse 71, temperature 97.8 F (36.6 C), resp. rate 18, height 5' 8.5" (1.74 m), weight 180 lb (81.6 kg), SpO2 97 %.  Filed Weights   12/26/17 1034  Weight: 180 lb (81.6 kg)    Labs & Radiologic Studies    CBC Recent Labs    12/27/17 0342  WBC 6.4  HGB 13.9  HCT 43.0  MCV 86.2  PLT 347*   Basic Metabolic Panel  Recent Labs    12/27/17 0342  NA 140  K 4.1  CL 107  CO2 26  GLUCOSE 158*  BUN 18  CREATININE 1.14  CALCIUM 8.5*    Disposition   Pt is being discharged home today in good condition.  Follow-up Plans & Appointments    Follow-up Information    [facility]. Go on 12/31/2017.   Specialty:  Cardiology Why:  @10am  for afib Contact information: [address] [id number] [place] [phone]         Discharge Instructions    Amb Referral to Cardiac Rehabilitation   Complete by:  As directed    Diagnosis:   Coronary Stents PTCA      Diet - low sodium heart healthy   Complete by:  As directed    Discharge instructions   Complete by:  As directed    No driving for 48 hours. No lifting over 5 lbs for 1 week. No sexual activity for 1 week. Keep procedure site clean & dry. If you notice increased pain, swelling, bleeding or pus, call/return!  You may shower, but no soaking baths/hot tubs/pools for 1 week.   Increase activity slowly   Complete by:  As directed       Discharge Medications   Allergies as of 12/27/2017      Reactions   Lipitor [atorvastatin] Other (See Comments)   myalgias      Medication List    TAKE these medications   apixaban 5 MG Tabs tablet Commonly known as:  ELIQUIS Take 1 tablet (5 mg total) by mouth 2 (two) times daily.   ARTIFICIAL TEARS OP Place 1 drop into both eyes 4 (four) times daily as needed (dry eyes).   aspirin EC 81 MG tablet Take 1 tablet (81 mg total) by mouth daily. What changed:  when to take this   azelastine 0.1 % nasal spray Commonly known as:  ASTELIN Place 1 spray into both nostrils 2 (two) times daily. Use in each nostril as directed What changed:    when to take this  reasons to take this  additional instructions   BENEFIBER PO Take 2 Scoops by mouth daily.   carvedilol 6.25 MG tablet Commonly known as:  COREG Take 1 tablet (6.25 mg total) by mouth 2 (two) times daily.   cholecalciferol 1000 units tablet Commonly known as:  VITAMIN D Take 1,000 Units by mouth at bedtime.   clopidogrel 75 MG tablet Commonly known as:  PLAVIX Take 1 tablet (75 mg total) by mouth daily with breakfast. Start taking on:  12/28/2017   fluticasone 50 MCG/ACT nasal spray Commonly known as:  FLONASE Place 1-2 sprays into both nostrils daily as needed for allergies.   hydrocortisone 2.5 % rectal cream Commonly known as:  ANUSOL-HC Place 1 application rectally 2 (two) times daily as needed for hemorrhoids.   irbesartan-hydrochlorothiazide 300-12.5 MG tablet Commonly  known as:  AVALIDE Take 1 tablet by mouth daily.   isosorbide mononitrate 30 MG 24 hr tablet Commonly known as:  IMDUR Take 1 tablet (30 mg total) by mouth daily.   Melatonin 5 MG Caps Take 1 capsule (5 mg total) by mouth daily as needed (sleep). What changed:  when to take this   nitroGLYCERIN 0.4 MG SL tablet Commonly known as:  NITROSTAT Place 1 tablet (0.4 mg total) under the tongue every 5 (five) minutes as needed for chest pain.   rosuvastatin 10 MG tablet Commonly known as:  CRESTOR TAKE 1 TABLET BY MOUTH EVERY DAY What changed:    how much to take  how to take this  when to take this   vitamin C 500 MG tablet Commonly known as:  ASCORBIC ACID Take 500 mg by mouth at bedtime.        Acute coronary syndrome (MI, NSTEMI, STEMI, etc) this admission?: No.    Outstanding Labs/Studies   None  Duration of Discharge Encounter   Greater than 30 minutes including physician time.  Jarrett Soho, PA 12/27/2017, 9:24 AM

## 2017-12-27 NOTE — Progress Notes (Addendum)
Progress Note  Patient Name: Dillon Young Date of Encounter: 12/27/2017  Primary Cardiologist: [name], MD   Subjective   Feeling well. No chest pain, sob or palpitations.   Inpatient Medications    Scheduled Meds: . angioplasty book   Does not apply Once  . aspirin EC  81 mg Oral QPM  . carvedilol  6.25 mg Oral BID  . clopidogrel  75 mg Oral Q breakfast  . hydrALAZINE  5 mg Intravenous Once  . irbesartan  300 mg Oral Daily   And  . hydrochlorothiazide  12.5 mg Oral Daily  . isosorbide mononitrate  30 mg Oral Daily  . rosuvastatin  10 mg Oral QHS  . sodium chloride flush  3 mL Intravenous Q12H   Continuous Infusions: . sodium chloride     PRN Meds: sodium chloride, acetaminophen, morphine injection, nitroGLYCERIN, ondansetron (ZOFRAN) IV, sodium chloride flush   Vital Signs    Vitals:   12/26/17 2235 12/26/17 2240 12/26/17 2245 12/27/17 0400  BP: (!) 171/64 (!) 107/55 109/63 (!) 133/46  Pulse:    71  Resp:      Temp:    97.7 F (36.5 C)  TempSrc:    Oral  SpO2:    97%  Weight:      Height:        Intake/Output Summary (Last 24 hours) at 12/27/2017 0737 Last data filed at 12/27/2017 0500 Gross per 24 hour  Intake -  Output 700 ml  Net -700 ml   Filed Weights   12/26/17 1034  Weight: 180 lb (81.6 kg)    Telemetry    afib at rate of 90s - Personally Reviewed  ECG    afib   Physical Exam   GEN: No acute distress.   Neck: No JVD Cardiac: IR IR , no murmurs, rubs, or gallops. Mild ecchymosis in R groin  Respiratory: Clear to auscultation bilaterally. GI: Soft, nontender, non-distended  MS: No edema; No deformity. Neuro:  Nonfocal  Psych: Normal affect   Labs    Chemistry Recent Labs  Lab 12/27/17 0342  NA 140  K 4.1  CL 107  CO2 26  GLUCOSE 158*  BUN 18  CREATININE 1.14  CALCIUM 8.5*  GFRNONAA 58*  GFRAA >60  ANIONGAP 7     Hematology Recent Labs  Lab 12/27/17 0342  WBC 6.4  RBC 4.99  HGB 13.9  HCT 43.0  MCV  86.2  MCH 27.9  MCHC 32.3  RDW 13.5  PLT 141*    Radiology    No results found.  Cardiac Studies   CORONARY STENT INTERVENTION  LEFT HEART CATH AND CORONARY ANGIOGRAPHY  Conclusion     Previously placed Prox RCA to Mid RCA stent (unknown type) is widely patent.  Prox LAD to Mid LAD lesion is 80% stenosed.  A stent was successfully placed.  Post intervention, there is a 0% residual stenosis.  There is severe left ventricular systolic dysfunction.  LV end diastolic pressure is mildly elevated.  The left ventricular ejection fraction is less than 25% by visual estimate.   IMPRESSION: Dillon Young has a widely patent mid RCA stent with what appears to be a significant mid LAD lesion.  I performed PCI and drug-eluting stenting with an excellent result.  This may be the culprit lesion responsible for his chest pain.  He does have severe LV dysfunction with an EF in the 20 to 25% range which I suspect is nonischemic.  Angiomax will continue for  4 hours full dose.  The sheath will be removed and pressure held.  Patient will be gently hydrated and discharged home in the morning on dual antiplatelet therapy.  Apixaban will start back tomorrow and he will continue with on aspirin, Plavix and apixaban for 1 month after which the aspirin will be discontinued.  Diagnostic Diagram       Post-Intervention Diagram          Patient Profile     84 y.o. male with a history of CAD s/p PCI CFX 1994 & 1995, Cath 12/2015 w/ DES RCA, RI 90% and LAD 70%>>med rx, PAF s/p DCCV 12/31/73, Chronic systolic CHF, probable sleep apnea but does not feel he would tolerate CPAP, sleeps in a recliner,HTN, CVA and HLD presented for scheduled cath for progessive angina.   Recent echo showed declined on LVEF to 30-35% by echo from 50-55%. Felt likely due to afib.   Assessment & Plan    1. CAD - Cath as above s/p PTCA and DES to LAD. Continue triple therapy for 1 month and then stop ASA. Continue statin.    2. HLD - 10/02/2017: Cholesterol, Total 121; HDL 46; LDL Calculated 50; Triglycerides 127  - Continue statin.   3. Chronic systolic CHF - Dr. [name] felt this is due to atrial fibrillation. Continue coreg and Avapro/HCTZ. Euvolemic. Has follow up in CHF clinic in 2 weeks.   4. Persistent atrial fibrillation - Failed cardioversion 7/2. Rate controlled. Has follow up in afib clinic 8/6. May consider antiarrhythmic vs ablation at that time. Resume eliquis today.   For questions or updates, please contact [facility] Please consult [email or web address] for contact info under Cardiology/STEMI.      Jarrett Soho, PA  12/27/2017, 7:37 AM     Patient seen and examined. Agree with assessment and plan. Feels well, No chest pain.  S/P PCI to LAD yesterday with patent RCA stent.  To re-start eliquis today, triple therapy for 1  Month, then dc ASA. AF rate controlled in the 70s.   Troy Sine, MD, [facility] 12/27/2017 9:02 AM

## 2017-12-27 NOTE — Progress Notes (Signed)
CARDIAC REHAB PHASE I   PRE:  Rate/Rhythm: 73 afib with PVCs    BP: sitting 112/51    SaO2:   MODE:  Ambulation: 400 ft   POST:  Rate/Rhythm: 101 afib with PVC    BP: sitting 117/65     SaO2:   Tolerated very well, no c/o. Ed completed including Plavix, restrictions, Afib, HF, ex, low sodium, daily wts, NTG and CRPII. Will refer to [place]. Pt likes to exercise. [place], ACSM 12/27/2017 9:04 AM

## 2017-12-29 ENCOUNTER — Other Ambulatory Visit: Payer: Self-pay | Admitting: Physician Assistant

## 2017-12-31 ENCOUNTER — Encounter (HOSPITAL_COMMUNITY): Payer: Self-pay | Admitting: Nurse Practitioner

## 2017-12-31 ENCOUNTER — Telehealth (HOSPITAL_COMMUNITY): Payer: Self-pay

## 2017-12-31 ENCOUNTER — Ambulatory Visit (HOSPITAL_COMMUNITY)
Admission: RE | Admit: 2017-12-31 | Discharge: 2017-12-31 | Disposition: A | Discharge status: Home / Self Care | Payer: PPO | Source: Ambulatory Visit | Attending: Nurse Practitioner | Admitting: Nurse Practitioner

## 2017-12-31 VITALS — BP 116/78 | HR 82 | Ht 68.5 in | Wt 180.0 lb

## 2017-12-31 DIAGNOSIS — K219 Gastro-esophageal reflux disease without esophagitis: Secondary | ICD-10-CM | POA: Insufficient documentation

## 2017-12-31 DIAGNOSIS — Z79899 Other long term (current) drug therapy: Secondary | ICD-10-CM | POA: Diagnosis not present

## 2017-12-31 DIAGNOSIS — Z87891 Personal history of nicotine dependence: Secondary | ICD-10-CM | POA: Diagnosis not present

## 2017-12-31 DIAGNOSIS — I493 Ventricular premature depolarization: Secondary | ICD-10-CM | POA: Insufficient documentation

## 2017-12-31 DIAGNOSIS — K589 Irritable bowel syndrome without diarrhea: Secondary | ICD-10-CM | POA: Insufficient documentation

## 2017-12-31 DIAGNOSIS — Z8673 Personal history of transient ischemic attack (TIA), and cerebral infarction without residual deficits: Secondary | ICD-10-CM | POA: Insufficient documentation

## 2017-12-31 DIAGNOSIS — Z7982 Long term (current) use of aspirin: Secondary | ICD-10-CM | POA: Diagnosis not present

## 2017-12-31 DIAGNOSIS — Z8546 Personal history of malignant neoplasm of prostate: Secondary | ICD-10-CM | POA: Insufficient documentation

## 2017-12-31 DIAGNOSIS — I509 Heart failure, unspecified: Secondary | ICD-10-CM | POA: Insufficient documentation

## 2017-12-31 DIAGNOSIS — Z8249 Family history of ischemic heart disease and other diseases of the circulatory system: Secondary | ICD-10-CM | POA: Insufficient documentation

## 2017-12-31 DIAGNOSIS — G4730 Sleep apnea, unspecified: Secondary | ICD-10-CM | POA: Insufficient documentation

## 2017-12-31 DIAGNOSIS — I4891 Unspecified atrial fibrillation: Secondary | ICD-10-CM | POA: Diagnosis present

## 2017-12-31 DIAGNOSIS — E785 Hyperlipidemia, unspecified: Secondary | ICD-10-CM | POA: Diagnosis not present

## 2017-12-31 DIAGNOSIS — N400 Enlarged prostate without lower urinary tract symptoms: Secondary | ICD-10-CM | POA: Insufficient documentation

## 2017-12-31 DIAGNOSIS — Z7901 Long term (current) use of anticoagulants: Secondary | ICD-10-CM | POA: Insufficient documentation

## 2017-12-31 DIAGNOSIS — N189 Chronic kidney disease, unspecified: Secondary | ICD-10-CM | POA: Insufficient documentation

## 2017-12-31 DIAGNOSIS — I2510 Atherosclerotic heart disease of native coronary artery without angina pectoris: Secondary | ICD-10-CM | POA: Insufficient documentation

## 2017-12-31 DIAGNOSIS — I481 Persistent atrial fibrillation: Secondary | ICD-10-CM | POA: Insufficient documentation

## 2017-12-31 DIAGNOSIS — I4819 Other persistent atrial fibrillation: Secondary | ICD-10-CM

## 2017-12-31 DIAGNOSIS — E1122 Type 2 diabetes mellitus with diabetic chronic kidney disease: Secondary | ICD-10-CM | POA: Diagnosis not present

## 2017-12-31 DIAGNOSIS — I130 Hypertensive heart and chronic kidney disease with heart failure and stage 1 through stage 4 chronic kidney disease, or unspecified chronic kidney disease: Secondary | ICD-10-CM | POA: Diagnosis not present

## 2017-12-31 DIAGNOSIS — Z7902 Long term (current) use of antithrombotics/antiplatelets: Secondary | ICD-10-CM | POA: Insufficient documentation

## 2017-12-31 DIAGNOSIS — Z955 Presence of coronary angioplasty implant and graft: Secondary | ICD-10-CM | POA: Insufficient documentation

## 2017-12-31 NOTE — Telephone Encounter (Signed)
Patients insurance is active and benefits verified through [facility] - $15.00 co-pay, no deductible, out of pocket amount of $3,400/$680.00 has been met, no co-insurance, and no pre-authorization is required. Reference #[id number]  Will contact patient to see if he is interested in the Cardiac Rehab Program. If interested, patient will need to complete follow up appt. Once completed, patient will be contacted for scheduling.

## 2017-12-31 NOTE — Progress Notes (Signed)
Primary Care Physician: [name], MD Referring Physician:Dr. Daryon Remmert is a 84 y.o. male with a h/o coronary artery disease status post angioplasty in 1994 and 1995of his circumflex coronary artery. . He has a history of hypertension and hyperlipidemia on medication. He had right carotid endarterectomy performed by Dr. Curt Jews June 2013.Myoview stress test performed 01/09/16 was notable for ischemia in the RCA distribution. Based on this he underwent cardiac catheterization by Dr. Varanasi8/21/17 revealing a 90% segmental mid dominant RCA stenosis which was stented with a Synergy 3.5 mm x 20 mm long drug-eluting stent. He did have a very small ramus branch that was 90% stenosed and not suitable for intervention as well as a 70% mid LAD lesion although he had no evidence of anterior ischemia on his stress test. His symptoms of chest pain have resolved.  He was diagnosed in May with new  onset  afib from which the pt is asymptomatic. Echo was done and showed EF decreased to 30-35%, normal EF by echo in August 2017. He has been on Eliquis 5 mg bid since early May without missed doses.  Plavix were stopped at that time, ASA continued and pt was started on carvedilol 6.25 mg bid.He states that he had a sleep study in the past but never slept enough with the study to get good results. He is afraid that he would never be able to tolerate a cpap. Sleeps in a recliner now, " I just feel more comfortable".  F/u in afib clinic 5/9, unfortunately cardioversion only held for 1-2 days.He developed  exertional chest discomfort   and was seen by R. Barrett, PA and had a cardiac cath which pt did receive a stent in the LAD. He feels improved but still remains in afib.  He did have to hold anticoagulation for 1-2 days and will be back on anticoagulation without interruption on 8/23.  We did discuss trying to restore SR with EF moderately reduced and he would like to purse after his vacation at  the end of the month..  Today, he denies symptoms of palpitations, shortness of breath, orthopnea, PND, lower extremity edema, dizziness, presyncope, syncope, or neurologic sequela.  . The patient is tolerating medications without difficulties and is otherwise without complaint today.   Past Medical History:  Diagnosis Date  . Adenomatous colon polyp   . Allergic rhinitis   . BPH (benign prostatic hyperplasia)   . CAD (coronary artery disease)    sees Dr. [name]   . Carotid artery disease ([place])   . CHF (congestive heart failure) ([place])   . Chronic kidney disease    [place]. Prostate cancer  . Clotting disorder ([place])   . CVA (cerebral infarction)   . Diabetes mellitus    "Borderline"  . Diverticulitis   . GERD (gastroesophageal reflux disease)    history  . Hemorrhoids   . HTN (hypertension)   . Hyperlipidemia LDL goal <70   . IBS (irritable bowel syndrome)   . Low back pain   . Prostate cancer ([place]) 2008   seed implantation  . Sleep apnea    last study was aborted.  Has never worn a CPAP.    Marland Kitchen Smoker    Past Surgical History:  Procedure Laterality Date  . APPENDECTOMY    . CARDIAC CATHETERIZATION N/A 01/16/2016   Procedure: Left Heart Cath and Coronary Angiography;  Surgeon: Jettie Booze, MD;  Location: [facility];  Service: Cardiovascular;  Laterality: N/A;  . CARDIAC CATHETERIZATION N/A 01/16/2016   Procedure: Coronary Stent Intervention;  Surgeon: Jettie Booze, MD;  Location: [facility];  Service: Cardiovascular;  Laterality: N/A;  . CARDIOVERSION N/A 11/26/2017   Procedure: CARDIOVERSION;  Surgeon: [name], MD;  Location: [facility];  Service: Cardiovascular;  Laterality: N/A;  . CATARACT EXTRACTION W/ INTRAOCULAR LENS  IMPLANT, BILATERAL Bilateral   . COLONOSCOPY  10/21/2008   diverticulosis, radiation proctitis, internal hemorrhoids  . CORONARY STENT INTERVENTION N/A 12/26/2017   Procedure: CORONARY STENT INTERVENTION;   Surgeon: [name], MD;  Location: [facility];  Service: Cardiovascular;  Laterality: N/A;  . ENDARTERECTOMY  11/12/2011   Procedure: ENDARTERECTOMY CAROTID;  Surgeon: Rosetta Posner, MD;  Location: [facility] OR;  Service: Vascular;  Laterality: Right;  . ESOPHAGOGASTRODUODENOSCOPY (EGD) WITH ESOPHAGEAL DILATION    . HEMORRHOID BANDING    . LEFT HEART CATH AND CORONARY ANGIOGRAPHY N/A 12/26/2017   Procedure: LEFT HEART CATH AND CORONARY ANGIOGRAPHY;  Surgeon: [name], MD;  Location: [facility];  Service: Cardiovascular;  Laterality: N/A;  . LITHOTRIPSY  X 3  . LUMBAR LAMINECTOMY    . TRANSURETHRAL RESECTION OF PROSTATE  10/2003    Current Outpatient Medications  Medication Sig Dispense Refill  . apixaban (ELIQUIS) 5 MG TABS tablet Take 1 tablet (5 mg total) by mouth 2 (two) times daily. 180 tablet 01  . aspirin 81 MG EC tablet TAKE 1 TABLET BY MOUTH EVERY DAY 90 tablet 1  . azelastine (ASTELIN) 0.1 % nasal spray Place 1 spray into both nostrils 2 (two) times daily. Use in each nostril as directed (Patient taking differently: Place 1 spray into both nostrils daily as needed for allergies. ) 30 mL 11  . carvedilol (COREG) 6.25 MG tablet Take 1 tablet (6.25 mg total) by mouth 2 (two) times daily. 180 tablet 1  . cholecalciferol (VITAMIN D) 1000 UNITS tablet Take 1,000 Units by mouth at bedtime.     . clopidogrel (PLAVIX) 75 MG tablet Take 1 tablet (75 mg total) by mouth daily with breakfast. 90 tablet 3  . fluticasone (FLONASE) 50 MCG/ACT nasal spray Place 1-2 sprays into both nostrils daily as needed for allergies. 48 g 1  . hydrocortisone (ANUSOL-HC) 2.5 % rectal cream Place 1 application rectally 2 (two) times daily as needed for hemorrhoids.     . Hypromellose (ARTIFICIAL TEARS OP) Place 1 drop into both eyes 4 (four) times daily as needed (dry eyes).    . irbesartan-hydrochlorothiazide (AVALIDE) 300-12.5 MG tablet Take 1 tablet by mouth daily. 90 tablet 3  . isosorbide  mononitrate (IMDUR) 30 MG 24 hr tablet Take 1 tablet (30 mg total) by mouth daily. 30 tablet 5  . Melatonin 5 MG CAPS Take 1 capsule (5 mg total) by mouth daily as needed (sleep). (Patient taking differently: Take 1 capsule by mouth at bedtime as needed (sleep). )    . nitroGLYCERIN (NITROSTAT) 0.4 MG SL tablet Place 1 tablet (0.4 mg total) under the tongue every 5 (five) minutes as needed for chest pain. 25 tablet 3  . rosuvastatin (CRESTOR) 10 MG tablet TAKE 1 TABLET BY MOUTH EVERY DAY (Patient taking differently: TAKE 1 TABLET BY MOUTH EVERY DAY at bedtime.) 90 tablet 3  . vitamin C (ASCORBIC ACID) 500 MG tablet Take 500 mg by mouth at bedtime.     . Wheat Dextrin (BENEFIBER PO) Take 2 Scoops by mouth daily.      No  current facility-administered medications for this encounter.     Allergies  Allergen Reactions  . Lipitor [Atorvastatin] Other (See Comments)    myalgias    Social History   Socioeconomic History  . Marital status: Married    Spouse name: Not on file  . Number of children: 2  . Years of education: Not on file  . Highest education level: Not on file  Occupational History  . 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 Financial resource strain: Not on file  . Food insecurity:    Worry: Not on file    Inability: Not on file  . Transportation needs:    Medical: Not on file    Non-medical: Not on file  Tobacco Use  . Smoking status: Former Smoker    Packs/day: 1.50    Years: 60.00    Pack years: 90.00    Types: Cigarettes    Last attempt to quit: 07/25/2008    Years since quitting: 9.4  . Smokeless tobacco: Never Used  Substance and Sexual Activity  . Alcohol use: Yes    Alcohol/week: 0.0 oz    Comment: Occ  . Drug use: No  . Sexual activity: Not on file  Lifestyle  . Physical activity:    Days per week: Not on file    Minutes per session: Not on file  . Stress: Not on file  Relationships  . Social connections:    Talks on phone: Not on file      Gets together: Not on file    Attends religious service: Not on file    Active member of club or organization: Not on file    Attends meetings of clubs or organizations: Not on file    Relationship status: Not on file  . Intimate partner violence:    Fear of current or ex partner: Not on file    Emotionally abused: Not on file    Physically abused: Not on file    Forced sexual activity: Not on file  Other Topics Concern  . Not on file  Social History Narrative  . Not on file    Family History  Problem Relation Age of Onset  . Heart disease Father   . Hypertension Mother   . Asthma Paternal Grandmother   . Colon cancer Neg Hx   . Esophageal cancer Neg Hx   . Stomach cancer Neg Hx   . Rectal cancer Neg Hx   . Liver cancer Neg Hx     ROS- All systems are reviewed and negative except as per the HPI above  Physical Exam: Vitals:   12/31/17 0948  BP: 116/78  Pulse: 82  Weight: 180 lb (81.6 kg)  Height: 5' 8.5" (1.74 m)   Wt Readings from Last 3 Encounters:  12/31/17 180 lb (81.6 kg)  12/26/17 180 lb (81.6 kg)  12/18/17 183 lb (83 kg)    Labs: Lab Results  Component Value Date   NA 140 12/27/2017   K 4.1 12/27/2017   CL 107 12/27/2017   CO2 26 12/27/2017   GLUCOSE 158 (H) 12/27/2017   BUN 18 12/27/2017   CREATININE 1.14 12/27/2017   CALCIUM 8.5 (L) 12/27/2017   MG 2.1 11/20/2017   Lab Results  Component Value Date   INR 1.1 12/18/2017   Lab Results  Component Value Date   CHOL 121 10/02/2017   HDL 46 10/02/2017   LDLCALC 50 10/02/2017   TRIG 127 10/02/2017     GEN- The  patient is well appearing, alert and oriented x 3 today.   Head- normocephalic, atraumatic Eyes-  Sclera clear, conjunctiva pink Ears- hearing intact Oropharynx- clear Neck- supple, no JVP Lymph- no cervical lymphadenopathy Lungs- Clear to ausculation bilaterally, normal work of breathing Heart- irregular rate and rhythm, no murmurs, rubs or gallops, PMI not laterally  displaced GI- soft, NT, ND, + BS Extremities- no clubbing, cyanosis, or edema MS- no significant deformity or atrophy Skin- no rash or lesion Psych- euthymic mood, full affect Neuro- strength and sensation are intact  EKG-afib at 82 bpm, qrs int 96 ms, qtc 453 ms, PVC's noted Echo-Study Conclusions  - Left ventricle: The cavity size was severely dilated. Systolic   function was moderately to severely reduced. The estimated   ejection fraction was in the range of 30% to 35%. There is   hypokinesis of the lateral myocardium. There is akinesis of the   inferolateral and apical myocardium. The study was not   technically sufficient to allow evaluation of LV diastolic   dysfunction due to atrial fibrillation. - Aorta: Maximal diameter of abdominal aorta: 41 mm. - Abdominal aorta: The abdominal aorta was moderately dilated. - Tricuspid valve: There was trivial regurgitation. - Pulmonic valve: There was trivial regurgitation.    Assessment and Plan: 1. New onset afib May 2019 He is rate controlled He is asymptomatic but with reduced EF Successful cardioversion but with ERAF Cardiac cath 8/1, receiving LAD stent with EF by cath 25% Restarted full day of eliquis 8/2. We discussed restoring SR to improve EF, options are sotalol, tikosyn or amiodarone Pt is interested  in Science writer, would fit guidelines to get drug free Not interested in the potential side effects of amiodarone  He would like to wait until he comes off vacation to come into hospital He will have to  stop HCTZ, closer to admission, drugs to be screened by PharmD  2. LV dysfunction No unusual shortness of breath Currently weight is stable Continue BB/ARB/HCTZ for now Pending establishing with Dr. [name] 8/12  F/u in afib clinic in 8/30  to set date for hopitalization He will have been back on uninterupted anticoagulation as of 8/23    Butch Penny C. Simonne Boulos, [facility] [address] [place], [place] [address] [phone]

## 2017-12-31 NOTE — Telephone Encounter (Signed)
Made initial call to patient in regards to Cardiac Rehab - Patient stated he has too many appts coming up and will stick to his schedule. Will not participate at this time. Closed referral.

## 2018-01-06 ENCOUNTER — Ambulatory Visit (HOSPITAL_COMMUNITY)
Admission: RE | Admit: 2018-01-06 | Discharge: 2018-01-06 | Disposition: A | Discharge status: Home / Self Care | Payer: PPO | Source: Ambulatory Visit | Attending: Cardiology | Admitting: Cardiology

## 2018-01-06 VITALS — BP 144/76 | HR 63 | Wt 180.6 lb

## 2018-01-06 DIAGNOSIS — I481 Persistent atrial fibrillation: Secondary | ICD-10-CM | POA: Diagnosis not present

## 2018-01-06 DIAGNOSIS — Z955 Presence of coronary angioplasty implant and graft: Secondary | ICD-10-CM | POA: Diagnosis not present

## 2018-01-06 DIAGNOSIS — I519 Heart disease, unspecified: Secondary | ICD-10-CM | POA: Diagnosis not present

## 2018-01-06 DIAGNOSIS — Z7901 Long term (current) use of anticoagulants: Secondary | ICD-10-CM | POA: Diagnosis not present

## 2018-01-06 DIAGNOSIS — E785 Hyperlipidemia, unspecified: Secondary | ICD-10-CM | POA: Insufficient documentation

## 2018-01-06 DIAGNOSIS — I2510 Atherosclerotic heart disease of native coronary artery without angina pectoris: Secondary | ICD-10-CM | POA: Insufficient documentation

## 2018-01-06 DIAGNOSIS — Z7982 Long term (current) use of aspirin: Secondary | ICD-10-CM | POA: Insufficient documentation

## 2018-01-06 DIAGNOSIS — Z87891 Personal history of nicotine dependence: Secondary | ICD-10-CM | POA: Insufficient documentation

## 2018-01-06 DIAGNOSIS — I4819 Other persistent atrial fibrillation: Secondary | ICD-10-CM

## 2018-01-06 DIAGNOSIS — Z79899 Other long term (current) drug therapy: Secondary | ICD-10-CM | POA: Insufficient documentation

## 2018-01-06 DIAGNOSIS — Z9861 Coronary angioplasty status: Secondary | ICD-10-CM | POA: Diagnosis not present

## 2018-01-06 DIAGNOSIS — Z7902 Long term (current) use of antithrombotics/antiplatelets: Secondary | ICD-10-CM | POA: Insufficient documentation

## 2018-01-06 DIAGNOSIS — I5022 Chronic systolic (congestive) heart failure: Secondary | ICD-10-CM | POA: Diagnosis not present

## 2018-01-06 DIAGNOSIS — I110 Hypertensive heart disease with heart failure: Secondary | ICD-10-CM | POA: Insufficient documentation

## 2018-01-06 MED ORDER — SACUBITRIL-VALSARTAN 49-51 MG PO TABS: 1.0000 | ORAL_TABLET | Freq: Two times a day (BID) | ORAL | 11 refills | Status: DC

## 2018-01-06 NOTE — Patient Instructions (Signed)
STOP Irbesartan-HCT.  STOP Aspirin on 01/26/18.  START Entresto 49-51 mg tablet twice daily. Use 30 day free card.  Return in 1 week for labs.  Follow up 3 weeks with Dillon Young CHF clinical pharmacist.  Follow up 6 weeks with Dr. Aundra Young.  Take all medication as prescribed the day of your appointment. Bring all medications with you to your appointment.  Do the following things EVERYDAY: 1) Weigh yourself in the morning before breakfast. Write it down and keep it in a log. 2) Take your medicines as prescribed 3) Eat low salt foods-Limit salt (sodium) to 2000 mg per day.  4) Stay as active as you can everyday 5) Limit all fluids for the day to less than 2 liters

## 2018-01-07 NOTE — Progress Notes (Signed)
PCP: Dr. Ronnald Ramp Cardiology: Dr. [name] HF Cardiology: Dr. [name]  84 y.o. with a h/o coronary artery disease, chronic systolic CHF, and persistent atrial fibrillation was referred by Dr. [name] for evaluation of CHF.  He is status post angioplasty in 1994 and 1995of his circumflex coronary artery. He had right carotid endarterectomy performed by Dr. Curt Jews June 2013.  Given angina, he had cath in 8/17.  This showed severe RCA stenosis treated with DES. He also had a very small ramus branch that was 90% stenosed and not suitable for intervention as well as a 70% mid LAD lesion although he had no evidence of anterior ischemia on his stress test.  He was diagnosed in 5/19 with new onset atrial fibrillation. Echo was done in 5/19 and showed EF decreased to 30-35%, normal EF by echo in August 2017. He had a DCCV in 5/19, unfortunately, he only held NSR for a few days and has been back in atrial fibrillation since then.  He had recurrent chest pain and was again cathed in 8/10, this time showing 80% proximal/mid LAD that was treated with DES.    He remains in atrial fibrillation today with reasonable rate control. Symptomatically doing better since last PCI.  No chest pain.  He walks for 30 minutes 5 days/week. When he first starts walking, he will feel short of breath, but this resolves the farther he walks.  No orthopnea/PND.  No BRBPR/melena. He can climb a flight of steps without trouble.   ECG (8/19, personally reviewed): atrial fibrillation, left axis deviation, PVC  Labs (5/19): LDL 50 Labs (8/19): K 4.1, creatinine 1.14  PMH: 1. HTN 2. Hyperlipidemia 3. Carotid artery disease: Right CEA in 6/13.  4. CAD: - PTCA to LCx in 1994, 1995.  - Angina 8/17 => DES to RCA, also noted to have 70% mLAD, 90% stenosis small ramus.  - LHC 12/26/17 with 80% ostial-proximal LAD, EF < 25% on LV-gram.  DES to LAD.  5. Chronic systolic CHF: Ischemic cardiomyopathy.   - Echo (8/17) with normal EF.  - Echo (5/19)  with EF 30-35%, severe LV dilation, regional wall motion abnormalities.  6. Atrial fibrillation: Diagnosed 5/19.   - DCCV 5/19 only held for about 2 days.   Social History   Socioeconomic History  . Marital status: Married    Spouse name: Not on file  . Number of children: 2  . Years of education: Not on file  . Highest education level: Not on file  Occupational History  . Occupation: [place] [employer]  Social Needs  . Financial resource strain: Not on file  . Food insecurity:    Worry: Not on file    Inability: Not on file  . Transportation needs:    Medical: Not on file    Non-medical: Not on file  Tobacco Use  . Smoking status: Former Smoker    Packs/day: 1.50    Years: 60.00    Pack years: 90.00    Types: Cigarettes    Last attempt to quit: 07/25/2008    Years since quitting: 9.4  . Smokeless tobacco: Never Used  Substance and Sexual Activity  . Alcohol use: Yes    Alcohol/week: 0.0 standard drinks    Comment: Occ  . Drug use: No  . Sexual activity: Not on file  Lifestyle  . Physical activity:    Days per week: Not on file    Minutes per session: Not on file  . Stress: Not on file  Relationships  .  Social connections:    Talks on phone: Not on file    Gets together: Not on file    Attends religious service: Not on file    Active member of club or organization: Not on file    Attends meetings of clubs or organizations: Not on file    Relationship status: Not on file  . Intimate partner violence:    Fear of current or ex partner: Not on file    Emotionally abused: Not on file    Physically abused: Not on file    Forced sexual activity: Not on file  Other Topics Concern  . Not on file  Social History Narrative  . Not on file   Family History  Problem Relation Age of Onset  . Heart disease Father   . Hypertension Mother   . Asthma Paternal Grandmother   . Colon cancer Neg Hx   . Esophageal cancer Neg Hx   . Stomach cancer Neg Hx   . Rectal  cancer Neg Hx   . Liver cancer Neg Hx    ROS: All systems reviewed and negative except as per HPI.   Current Outpatient Medications  Medication Sig Dispense Refill  . apixaban (ELIQUIS) 5 MG TABS tablet Take 1 tablet (5 mg total) by mouth 2 (two) times daily. 180 tablet 01  . aspirin 81 MG EC tablet TAKE 1 TABLET BY MOUTH EVERY DAY 90 tablet 1  . azelastine (ASTELIN) 0.1 % nasal spray Place 1 spray into both nostrils 2 (two) times daily. Use in each nostril as directed (Patient taking differently: Place 1 spray into both nostrils daily as needed for allergies. ) 30 mL 11  . carvedilol (COREG) 6.25 MG tablet Take 1 tablet (6.25 mg total) by mouth 2 (two) times daily. 180 tablet 1  . cholecalciferol (VITAMIN D) 1000 UNITS tablet Take 1,000 Units by mouth at bedtime.     . clopidogrel (PLAVIX) 75 MG tablet Take 1 tablet (75 mg total) by mouth daily with breakfast. 90 tablet 3  . fluticasone (FLONASE) 50 MCG/ACT nasal spray Place 1-2 sprays into both nostrils daily as needed for allergies. 48 g 1  . hydrocortisone (ANUSOL-HC) 2.5 % rectal cream Place 1 application rectally 2 (two) times daily as needed for hemorrhoids.     . Hypromellose (ARTIFICIAL TEARS OP) Place 1 drop into both eyes 4 (four) times daily as needed (dry eyes).    . isosorbide mononitrate (IMDUR) 30 MG 24 hr tablet Take 1 tablet (30 mg total) by mouth daily. 30 tablet 5  . Melatonin 5 MG CAPS Take 1 capsule (5 mg total) by mouth daily as needed (sleep). (Patient taking differently: Take 1 capsule by mouth at bedtime as needed (sleep). )    . nitroGLYCERIN (NITROSTAT) 0.4 MG SL tablet Place 1 tablet (0.4 mg total) under the tongue every 5 (five) minutes as needed for chest pain. 25 tablet 3  . rosuvastatin (CRESTOR) 10 MG tablet TAKE 1 TABLET BY MOUTH EVERY DAY (Patient taking differently: TAKE 1 TABLET BY MOUTH EVERY DAY at bedtime.) 90 tablet 3  . vitamin C (ASCORBIC ACID) 500 MG tablet Take 500 mg by mouth at bedtime.     . Wheat  Dextrin (BENEFIBER PO) Take 2 Scoops by mouth daily.     . sacubitril-valsartan (ENTRESTO) 49-51 MG Take 1 tablet by mouth 2 (two) times daily. 60 tablet 11   No current facility-administered medications for this encounter.    BP (!) 144/76   Pulse  63   Wt 81.9 kg (180 lb 9.6 oz)   SpO2 95%   BMI 27.06 kg/m  General: NAD Neck: No JVD, no thyromegaly or thyroid nodule.  Lungs: Clear to auscultation bilaterally with normal respiratory effort. CV: Nondisplaced PMI.  Heart regular S1/S2, no S3/S4, no murmur.  No peripheral edema.  No carotid bruit.  Normal pedal pulses.  Abdomen: Soft, nontender, no hepatosplenomegaly, no distention.  Skin: Intact without lesions or rashes.  Neurologic: Alert and oriented x 3.  Psych: Normal affect. Extremities: No clubbing or cyanosis.  HEENT: Normal.   Assessment/Plan: 1. Chronic systolic CHF: Suspect mixed ischemic/nonischemic cardiomyopathy.  Last echo in 5/19 with EF 30-35% and severe LV dilation.  CAD likely plays role, but fall in EF was noted around the time he went into atrial fibrillation.  No RVR, but can see fall in EF with afib even in the absence of RVR.  He is not volume overloaded on exam, NYHA class II.  - Continue Coreg 6.25 mg bid.  - Stop irbesartan/HCT and start on Entresto 49/51 bid.  BMET 7 days.  - Will followup with CHF pharmacist for med titration, hopefully will add spironolactone next.  - Repeat echo in 11/19 to look for improvement in EF post-PCI.  2. CAD: Most recently, DES to proximal/mid LAD on 12/26/17. No chest pain since then.  - Continue apixaban 5 mg bid  - Continue Plavix x at least 6 months.  - Stop ASA 01/26/18 (1 month).  - I will arrange for cardiac rehab.  - Continue Crestor, good LDL in 5/19.  3. Atrial fibrillation: Persistent.  DCCV in 5/19 only held a few days.  He has been in atrial fibrillation since then.  With CHF/low EF, think he would benefit from resumption of NSR.  I doubt this will be successful  without an anti-arrhyhmic.   - We had a long discussion about Tikosyn today.  I think this is a good option for him.  He is willing to start Tikosyn, will ask the atrial fibrillation clinic to set up [place] admission.  QTc ok on ECG today.   Followup HF pharmacist in 1 month.   Followup with me in 6 wks.   [name] 01/07/2018

## 2018-01-10 ENCOUNTER — Telehealth (HOSPITAL_COMMUNITY): Payer: Self-pay

## 2018-01-10 NOTE — Telephone Encounter (Signed)
Pt called and stated his [facility] Young 12-87 is causing him dizziness 08/15 and 08/16. Pt states he has been on this dose for a week. Please advise.

## 2018-01-10 NOTE — Telephone Encounter (Signed)
Pt.notified

## 2018-01-10 NOTE — Telephone Encounter (Signed)
Can decrease to Entresto 24/26 bid for now.  Will try to increase again at followup.

## 2018-01-13 ENCOUNTER — Ambulatory Visit (HOSPITAL_COMMUNITY)
Admission: RE | Admit: 2018-01-13 | Discharge: 2018-01-13 | Disposition: A | Discharge status: Home / Self Care | Payer: PPO | Source: Ambulatory Visit | Attending: Internal Medicine | Admitting: Internal Medicine

## 2018-01-13 DIAGNOSIS — I519 Heart disease, unspecified: Secondary | ICD-10-CM | POA: Diagnosis not present

## 2018-01-13 LAB — BASIC METABOLIC PANEL
ANION GAP: 4 — AB (ref 5–15)
BUN: 16 mg/dL (ref 8–23)
CALCIUM: 9.4 mg/dL (ref 8.9–10.3)
CO2: 34 mmol/L — AB (ref 22–32)
Chloride: 105 mmol/L (ref 98–111)
Creatinine, Ser: 1.19 mg/dL (ref 0.61–1.24)
GFR calc non Af Amer: 55 mL/min — ABNORMAL LOW (ref >60)
Glucose, Bld: 145 mg/dL — ABNORMAL HIGH (ref 70–99)
Potassium: 4.6 mmol/L (ref 3.5–5.1)
Sodium: 143 mmol/L (ref 135–145)

## 2018-01-14 ENCOUNTER — Telehealth (HOSPITAL_COMMUNITY): Payer: Self-pay

## 2018-01-14 NOTE — Telephone Encounter (Signed)
Called patient to see if he is interested in the Cardiac Rehab Program. Patient expressed interest but would like to have some time to think it over. Explained scheduling process and went over insurance, patient verbalized understanding.  Will follow up with pt in a week.

## 2018-01-14 NOTE — Telephone Encounter (Signed)
Patients insurance is active and benefits verified through [employer] - $15.00 co-pay, no deductible, out of pocket amount of $3,400/$680.00 has been met, no co-insurance, and no pre-authorization is required. All codes are valid and billable. Passport/reference [id number]  Referral under review by RN Navigator.

## 2018-01-16 ENCOUNTER — Telehealth (HOSPITAL_COMMUNITY): Payer: Self-pay | Admitting: Pharmacist

## 2018-01-16 NOTE — Telephone Encounter (Signed)
Entresto 49-51 mg BID PA approved by [employer] through 05/27/18.   [name], PharmD, BCPS, CPP Clinical Pharmacist Phone: [phone] 01/16/2018 4:22 PM

## 2018-01-21 ENCOUNTER — Telehealth (HOSPITAL_COMMUNITY): Payer: Self-pay

## 2018-01-21 NOTE — Telephone Encounter (Signed)
Called patient to see if he was interested in participating in the Cardiac Rehab Program. Patient stated not at this time, has a lot of trips coming up.  Closed referral

## 2018-01-24 ENCOUNTER — Ambulatory Visit (HOSPITAL_COMMUNITY): Payer: PPO | Admitting: Nurse Practitioner

## 2018-01-28 ENCOUNTER — Ambulatory Visit (HOSPITAL_COMMUNITY)
Admission: RE | Admit: 2018-01-28 | Discharge: 2018-01-28 | Disposition: A | Discharge status: Home / Self Care | Payer: PPO | Source: Ambulatory Visit | Attending: Cardiology | Admitting: Cardiology

## 2018-01-28 DIAGNOSIS — I428 Other cardiomyopathies: Secondary | ICD-10-CM | POA: Insufficient documentation

## 2018-01-28 DIAGNOSIS — I481 Persistent atrial fibrillation: Secondary | ICD-10-CM | POA: Diagnosis not present

## 2018-01-28 DIAGNOSIS — I5022 Chronic systolic (congestive) heart failure: Secondary | ICD-10-CM | POA: Diagnosis not present

## 2018-01-28 DIAGNOSIS — I2510 Atherosclerotic heart disease of native coronary artery without angina pectoris: Secondary | ICD-10-CM | POA: Diagnosis not present

## 2018-01-28 MED ORDER — SPIRONOLACTONE 25 MG PO TABS: 12.5000 mg | ORAL_TABLET | Freq: Every day | ORAL | 5 refills | Status: DC

## 2018-01-28 NOTE — Progress Notes (Signed)
HF MD: [facility]  HPI:  84 y.o.with a h/o coronary artery disease, chronic systolic CHF, and persistent atrial fibrillation was referred by Dr. Gwenlyn Young for evaluation of CHF.  He is status post angioplasty in 1994 and 1995of his circumflex coronary artery. He had right carotid endarterectomy performed by Dr. Curt Young June 2013.  Given angina, he had cath in 8/17.  This showed severe RCA stenosis treated with DES. He also had a very small ramus branch that was 90% stenosed and not suitable for intervention as well as a 70% mid LAD lesion although he had no evidence of anterior ischemia on his stress test.  He was diagnosed in 5/19 with new onset atrial fibrillation. Echo was done in 5/19 and showed EF decreased to 30-35%, normal EF by echo in August 2017. He had a DCCV in 5/19, unfortunately, he only held NSR for a few days and has been back in atrial fibrillation since then.  He had recurrent chest pain and was again cathed in 8/10, this time showing 80% proximal/mid LAD that was treated with DES.    He returns today for pharmacist-led HF medication titration. At last HF clinic visit on 8/12, his irbesartan-HCT was stopped and Entresto 49-51 mg BID was started. Symptomatically doing better since last PCI.  Had one bout of chest pain which was resolved with 1 NTG. No chest pain since. He walks for 30 minutes 5 days/week. When he first starts walking, he will feel short of breath, but this resolves the farther he walks.    . Shortness of breath/dyspnea on exertion? no  . Orthopnea/PND? no . Edema? no . Lightheadedness/dizziness? no . Daily weights at home? Yes - stable . Blood pressure/heart rate monitoring at home? Yes - 124/70 mmHg -- ranges 90/50-130/80 mmHg . Following low-sodium/fluid-restricted diet? Yes  HF Medications: Carvedilol 6.25 mg PO BID Entresto 49-51 mg PO BID  Has the patient been experiencing any side effects to the medications prescribed?  no  Does the patient have any  problems obtaining medications due to transportation or finances?   No - [employer] Part D  Understanding of regimen: good Understanding of indications: good Potential of compliance: good Patient understands to avoid NSAIDs. Patient understands to avoid decongestants.    Pertinent Lab Values: . 01/13/18: Serum creatinine 1.19, BUN 16, Potassium 4.6, Sodium 143  Vital Signs: . Weight: 183 lb (dry weight: 180 lb) . Blood pressure: 148/88 mmHg  . Heart rate: 71 bpm   Assessment: 1. Chronic systolic CHF (EF 50-93%), due to ICM/NICM. NYHA class II symptoms.  - Volume status stable  - Start spironolactone 12.5 mg daily   - Continue carvedilol 6.25 mg BID and Entresto 49-51 mg BID  - Basic disease state pathophysiology, medication indication, mechanism and side effects reviewed at length with patient and he verbalized understanding 2. CAD: Most recently, DES to proximal/mid LAD on 12/26/17. No chest pain since then.  - Continue apixaban 5 mg bid  - Continue Plavix x at least 6 months.  - Stop ASA 01/26/18 (1 month).  - I will arrange for cardiac rehab.  - Continue Crestor, good LDL in 5/19.  3. Atrial fibrillation: Persistent.  DCCV in 5/19 only held a few days.  He has been in atrial fibrillation since then.  With CHF/low EF, think he would benefit from resumption of NSR.  I doubt this will be successful without an anti-arrhyhmic.   - He is willing to start Tikosyn, will ask the atrial fibrillation clinic to set  up [place] admission.  QTc ok on recent ECG   Plan: 1) Medication changes: Based on clinical presentation, vital signs and recent labs will add spironolactone 12.5 mg daily  2) Labs: BMET in 1 week  3) Follow-up: Pharmacy visit on 02/06/18 and Dr. Aundra Young on 02/28/18   Dillon Young. Dillon Young, PharmD, BCPS, CPP Clinical Pharmacist Phone: [phone] 01/28/2018 8:42 AM

## 2018-01-28 NOTE — Patient Instructions (Addendum)
It was great to meet you today!  Please START spironolactone 12.5 mg (1/2 tablet) ONCE DAILY.   You are scheduled with the pharmacist again on 9/12 at 9:00 am.   Please keep your appointment with Dr. [name] on 02/28/18.

## 2018-01-31 ENCOUNTER — Encounter (HOSPITAL_COMMUNITY): Payer: Self-pay | Admitting: Nurse Practitioner

## 2018-01-31 ENCOUNTER — Ambulatory Visit (HOSPITAL_COMMUNITY)
Admission: RE | Admit: 2018-01-31 | Discharge: 2018-01-31 | Disposition: A | Discharge status: Home / Self Care | Payer: PPO | Source: Ambulatory Visit | Attending: Nurse Practitioner | Admitting: Nurse Practitioner

## 2018-01-31 VITALS — BP 164/86 | HR 80 | Ht 68.5 in | Wt 183.0 lb

## 2018-01-31 DIAGNOSIS — E1122 Type 2 diabetes mellitus with diabetic chronic kidney disease: Secondary | ICD-10-CM | POA: Diagnosis not present

## 2018-01-31 DIAGNOSIS — K219 Gastro-esophageal reflux disease without esophagitis: Secondary | ICD-10-CM | POA: Diagnosis not present

## 2018-01-31 DIAGNOSIS — N189 Chronic kidney disease, unspecified: Secondary | ICD-10-CM | POA: Insufficient documentation

## 2018-01-31 DIAGNOSIS — Z8546 Personal history of malignant neoplasm of prostate: Secondary | ICD-10-CM | POA: Insufficient documentation

## 2018-01-31 DIAGNOSIS — Z87891 Personal history of nicotine dependence: Secondary | ICD-10-CM | POA: Insufficient documentation

## 2018-01-31 DIAGNOSIS — I509 Heart failure, unspecified: Secondary | ICD-10-CM | POA: Diagnosis not present

## 2018-01-31 DIAGNOSIS — G4730 Sleep apnea, unspecified: Secondary | ICD-10-CM | POA: Diagnosis not present

## 2018-01-31 DIAGNOSIS — I481 Persistent atrial fibrillation: Secondary | ICD-10-CM

## 2018-01-31 DIAGNOSIS — Z955 Presence of coronary angioplasty implant and graft: Secondary | ICD-10-CM | POA: Diagnosis not present

## 2018-01-31 DIAGNOSIS — Z79899 Other long term (current) drug therapy: Secondary | ICD-10-CM | POA: Diagnosis not present

## 2018-01-31 DIAGNOSIS — K589 Irritable bowel syndrome without diarrhea: Secondary | ICD-10-CM | POA: Insufficient documentation

## 2018-01-31 DIAGNOSIS — I2510 Atherosclerotic heart disease of native coronary artery without angina pectoris: Secondary | ICD-10-CM | POA: Insufficient documentation

## 2018-01-31 DIAGNOSIS — I4589 Other specified conduction disorders: Secondary | ICD-10-CM | POA: Insufficient documentation

## 2018-01-31 DIAGNOSIS — R9431 Abnormal electrocardiogram [ECG] [EKG]: Secondary | ICD-10-CM | POA: Diagnosis not present

## 2018-01-31 DIAGNOSIS — I4891 Unspecified atrial fibrillation: Secondary | ICD-10-CM | POA: Diagnosis not present

## 2018-01-31 DIAGNOSIS — Z8673 Personal history of transient ischemic attack (TIA), and cerebral infarction without residual deficits: Secondary | ICD-10-CM | POA: Diagnosis not present

## 2018-01-31 DIAGNOSIS — N400 Enlarged prostate without lower urinary tract symptoms: Secondary | ICD-10-CM | POA: Insufficient documentation

## 2018-01-31 DIAGNOSIS — Z7901 Long term (current) use of anticoagulants: Secondary | ICD-10-CM | POA: Insufficient documentation

## 2018-01-31 DIAGNOSIS — I4819 Other persistent atrial fibrillation: Secondary | ICD-10-CM

## 2018-01-31 DIAGNOSIS — E785 Hyperlipidemia, unspecified: Secondary | ICD-10-CM | POA: Diagnosis not present

## 2018-01-31 DIAGNOSIS — I130 Hypertensive heart and chronic kidney disease with heart failure and stage 1 through stage 4 chronic kidney disease, or unspecified chronic kidney disease: Secondary | ICD-10-CM | POA: Diagnosis not present

## 2018-01-31 NOTE — Progress Notes (Signed)
Primary Care Physician: [name], MD Referring Physician:Dr. Diago Haik is a 84 y.o. male with a h/o coronary artery disease status post angioplasty in 1994 and 1995of his circumflex coronary artery. . He has a history of hypertension and hyperlipidemia on medication. He had right carotid endarterectomy performed by Dr. Curt Jews June 2013.Myoview stress test performed 01/09/16 was notable for ischemia in the RCA distribution. Based on this he underwent cardiac catheterization by Dr. Varanasi8/21/17 revealing a 90% segmental mid dominant RCA stenosis which was stented with a Synergy 3.5 mm x 20 mm long drug-eluting stent. He did have a very small ramus branch that was 90% stenosed and not suitable for intervention as well as a 70% mid LAD lesion although he had no evidence of anterior ischemia on his stress test. His symptoms of chest pain have resolved.  He was diagnosed in May with new  onset  afib from which the pt is asymptomatic. Echo was done and showed EF decreased to 30-35%, normal EF by echo in August 2017. He has been on Eliquis 5 mg bid since early May without missed doses.  Plavix were stopped at that time, ASA continued and pt was started on carvedilol 6.25 mg bid.He states that he had a sleep study in the past but never slept enough with the study to get good results. He is afraid that he would never be able to tolerate a cpap. Sleeps in a recliner now, " I just feel more comfortable".  F/u in afib clinic 5/9, unfortunately cardioversion only held for 1-2 days.He developed  exertional chest discomfort   and was seen by R. Barrett, PA and had a cardiac cath which pt did receive a stent in the LAD. He feels improved but still remains in afib.  He did have to hold anticoagulation for 1-2 days and will be back on anticoagulation without interruption on 8/23.  We did discuss trying to restore SR with EF moderately reduced and he would like to purse after his vacation at  the end of the month.  F/u in afib clinic, 9/6. He is back in afib clinic to discuss Tikosyn admit.  He has recently seen Dr. [name] and he also encouraged pt to consider Tiksoyn for his LV dysfunction.Marland Kitchen He is feeling better and was hoping he could get by without drug. He will commit but wants to wait until 10/1. He also reports that he has been having h/a's ever since he started Imdur. I discussed with Doroteo Bradford, PharmD, and will try to reduce Imdur to 30 mg 1/2 tab daily.  Today, he denies symptoms of palpitations, shortness of breath, orthopnea, PND, lower extremity edema, dizziness, presyncope, syncope, or neurologic sequela.  . The patient is tolerating medications without difficulties and is otherwise without complaint today.   Past Medical History:  Diagnosis Date  . Adenomatous colon polyp   . Allergic rhinitis   . BPH (benign prostatic hyperplasia)   . CAD (coronary artery disease)    sees Dr. [name]   . Carotid artery disease ([place])   . CHF (congestive heart failure) ([place])   . Chronic kidney disease    [place]. Prostate cancer  . Clotting disorder ([place])   . CVA (cerebral infarction)   . Diabetes mellitus    "Borderline"  . Diverticulitis   . GERD (gastroesophageal reflux disease)    history  . Hemorrhoids   . HTN (hypertension)   . Hyperlipidemia LDL goal <70   . IBS (  irritable bowel syndrome)   . Low back pain   . Prostate cancer ([place]) 2008   seed implantation  . Sleep apnea    last study was aborted.  Has never worn a CPAP.    Marland Kitchen Smoker    Past Surgical History:  Procedure Laterality Date  . APPENDECTOMY    . CARDIAC CATHETERIZATION N/A 01/16/2016   Procedure: Left Heart Cath and Coronary Angiography;  Surgeon: Jettie Booze, MD;  Location: [place] [facility];  Service: Cardiovascular;  Laterality: N/A;  . CARDIAC CATHETERIZATION N/A 01/16/2016   Procedure: Coronary Stent Intervention;  Surgeon: Jettie Booze, MD;  Location: [place] [facility];  Service:  Cardiovascular;  Laterality: N/A;  . CARDIOVERSION N/A 11/26/2017   Procedure: CARDIOVERSION;  Surgeon: [name], MD;  Location: [facility] ENDOSCOPY;  Service: Cardiovascular;  Laterality: N/A;  . CATARACT EXTRACTION W/ INTRAOCULAR LENS  IMPLANT, BILATERAL Bilateral   . COLONOSCOPY  10/21/2008   diverticulosis, radiation proctitis, internal hemorrhoids  . CORONARY STENT INTERVENTION N/A 12/26/2017   Procedure: CORONARY STENT INTERVENTION;  Surgeon: [name], MD;  Location: [place] [facility];  Service: Cardiovascular;  Laterality: N/A;  . ENDARTERECTOMY  11/12/2011   Procedure: ENDARTERECTOMY CAROTID;  Surgeon: Rosetta Posner, MD;  Location: [facility] OR;  Service: Vascular;  Laterality: Right;  . ESOPHAGOGASTRODUODENOSCOPY (EGD) WITH ESOPHAGEAL DILATION    . HEMORRHOID BANDING    . LEFT HEART CATH AND CORONARY ANGIOGRAPHY N/A 12/26/2017   Procedure: LEFT HEART CATH AND CORONARY ANGIOGRAPHY;  Surgeon: [name], MD;  Location: [place] [facility];  Service: Cardiovascular;  Laterality: N/A;  . LITHOTRIPSY  X 3  . LUMBAR LAMINECTOMY    . TRANSURETHRAL RESECTION OF PROSTATE  10/2003    Current Outpatient Medications  Medication Sig Dispense Refill  . apixaban (ELIQUIS) 5 MG TABS tablet Take 1 tablet (5 mg total) by mouth 2 (two) times daily. 180 tablet 01  . azelastine (ASTELIN) 0.1 % nasal spray Place 1 spray into both nostrils 2 (two) times daily. Use in each nostril as directed (Patient taking differently: Place 1 spray into both nostrils daily as needed for allergies. ) 30 mL 11  . carvedilol (COREG) 6.25 MG tablet Take 1 tablet (6.25 mg total) by mouth 2 (two) times daily. 180 tablet 1  . cholecalciferol (VITAMIN D) 1000 UNITS tablet Take 1,000 Units by mouth at bedtime.     . clopidogrel (PLAVIX) 75 MG tablet Take 1 tablet (75 mg total) by mouth daily with breakfast. 90 tablet 3  . fluticasone (FLONASE) 50 MCG/ACT nasal spray Place 1-2 sprays into both nostrils daily as needed for  allergies. 48 g 1  . hydrocortisone (ANUSOL-HC) 2.5 % rectal cream Place 1 application rectally 2 (two) times daily as needed for hemorrhoids.     . Hypromellose (ARTIFICIAL TEARS OP) Place 1 drop into both eyes 4 (four) times daily as needed (dry eyes).    . isosorbide mononitrate (IMDUR) 30 MG 24 hr tablet Take 1 tablet (30 mg total) by mouth daily. 30 tablet 5  . Melatonin 10 MG TABS Take 10 mg by mouth at bedtime.    . nitroGLYCERIN (NITROSTAT) 0.4 MG SL tablet Place 1 tablet (0.4 mg total) under the tongue every 5 (five) minutes as needed for chest pain. 25 tablet 3  . rosuvastatin (CRESTOR) 10 MG tablet Take 10 mg by mouth daily.    . sacubitril-valsartan (ENTRESTO) 49-51 MG Take 1 tablet by mouth 2 (two) times daily.  60 tablet 11  . spironolactone (ALDACTONE) 25 MG tablet Take 0.5 tablets (12.5 mg total) by mouth daily. 15 tablet 5  . vitamin C (ASCORBIC ACID) 500 MG tablet Take 500 mg by mouth at bedtime.     . Wheat Dextrin (BENEFIBER PO) Take 2 Scoops by mouth daily.      No current facility-administered medications for this encounter.     Allergies  Allergen Reactions  . Lipitor [Atorvastatin] Other (See Comments)    myalgias    Social History   Socioeconomic History  . Marital status: Married    Spouse name: Not on file  . Number of children: 2  . Years of education: Not on file  . Highest education level: Not on file  Occupational History  . 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 Financial resource strain: Not on file  . Food insecurity:    Worry: Not on file    Inability: Not on file  . Transportation needs:    Medical: Not on file    Non-medical: Not on file  Tobacco Use  . Smoking status: Former Smoker    Packs/day: 1.50    Years: 60.00    Pack years: 90.00    Types: Cigarettes    Last attempt to quit: 07/25/2008    Years since quitting: 9.5  . Smokeless tobacco: Never Used  Substance and Sexual Activity  . Alcohol use: Yes     Alcohol/week: 0.0 standard drinks    Comment: Occ  . Drug use: No  . Sexual activity: Not on file  Lifestyle  . Physical activity:    Days per week: Not on file    Minutes per session: Not on file  . Stress: Not on file  Relationships  . Social connections:    Talks on phone: Not on file    Gets together: Not on file    Attends religious service: Not on file    Active member of club or organization: Not on file    Attends meetings of clubs or organizations: Not on file    Relationship status: Not on file  . Intimate partner violence:    Fear of current or ex partner: Not on file    Emotionally abused: Not on file    Physically abused: Not on file    Forced sexual activity: Not on file  Other Topics Concern  . Not on file  Social History Narrative  . Not on file    Family History  Problem Relation Age of Onset  . Heart disease Father   . Hypertension Mother   . Asthma Paternal Grandmother   . Colon cancer Neg Hx   . Esophageal cancer Neg Hx   . Stomach cancer Neg Hx   . Rectal cancer Neg Hx   . Liver cancer Neg Hx     ROS- All systems are reviewed and negative except as per the HPI above  Physical Exam: Vitals:   01/31/18 1056  BP: (!) 164/86  Pulse: 80  Weight: 83 kg  Height: 5' 8.5" (1.74 m)   Wt Readings from Last 3 Encounters:  01/31/18 83 kg  01/28/18 83.4 kg  01/06/18 81.9 kg    Labs: Lab Results  Component Value Date   NA 143 01/13/2018   K 4.6 01/13/2018   CL 105 01/13/2018   CO2 34 (H) 01/13/2018   GLUCOSE 145 (H) 01/13/2018   BUN 16 01/13/2018   CREATININE 1.19 01/13/2018   CALCIUM 9.4 01/13/2018  MG 2.1 11/20/2017   Lab Results  Component Value Date   INR 1.1 12/18/2017   Lab Results  Component Value Date   CHOL 121 10/02/2017   HDL 46 10/02/2017   LDLCALC 50 10/02/2017   TRIG 127 10/02/2017     GEN- The patient is well appearing, alert and oriented x 3 today.   Head- normocephalic, atraumatic Eyes-  Sclera clear,  conjunctiva pink Ears- hearing intact Oropharynx- clear Neck- supple, no JVP Lymph- no cervical lymphadenopathy Lungs- Clear to ausculation bilaterally, normal work of breathing Heart- irregular rate and rhythm, no murmurs, rubs or gallops, PMI not laterally displaced GI- soft, NT, ND, + BS Extremities- no clubbing, cyanosis, or edema MS- no significant deformity or atrophy Skin- no rash or lesion Psych- euthymic mood, full affect Neuro- strength and sensation are intact  EKG-afib at 80 bpm, qrs int 96 ms, qtc 440 ms, PVC's noted Echo-Study Conclusions  - Left ventricle: The cavity size was severely dilated. Systolic   function was moderately to severely reduced. The estimated   ejection fraction was in the range of 30% to 35%. There is   hypokinesis of the lateral myocardium. There is akinesis of the   inferolateral and apical myocardium. The study was not   technically sufficient to allow evaluation of LV diastolic   dysfunction due to atrial fibrillation. - Aorta: Maximal diameter of abdominal aorta: 41 mm. - Abdominal aorta: The abdominal aorta was moderately dilated. - Tricuspid valve: There was trivial regurgitation. - Pulmonic valve: There was trivial regurgitation.    Assessment and Plan: 1. New onset afib May 2019 He is rate controlled He is feeling better but with reduced EF Successful cardioversion but with ERAF Cardiac cath 8/1, receiving LAD stent with EF by cath 25% Restarted  eliquis 8/2, reminded not to miss doses We discussed restoring SR to improve EF, options are sotalol, tikosyn or amiodarone Pt is interested  in Science writer, would fit guidelines to get drug free, he wants to wait until 10/1 for hospitalization   2. LV dysfunction No unusual shortness of breath Currently weight is stable Continue BB/ARB/spironolactone for now Pending establishing with Dr. [name] 8/12  3. CAD Stable Pt is c/o of H/A since starting Imdur 30 mg daily in July I  discussed with Doroteo Bradford, PharmD and she recommended cutting Imdur in half to 15 mg daily    Butch Penny C. Sahaana Weitman, [facility] [address] [place], [place] [address] [phone]

## 2018-02-02 ENCOUNTER — Telehealth: Payer: Self-pay | Admitting: Pharmacist

## 2018-02-02 NOTE — Telephone Encounter (Signed)
Medication list reviewed in anticipation of upcoming Tikosyn initiation. Patient is not taking any contraindicated or QTc prolonging medications.   Patient is anticoagulated on Eliquis on the appropriate dose. Please ensure that patient has not missed any anticoagulation doses in the 3 weeks prior to Tikosyn initiation.   Patient will need to be counseled to avoid use of Benadryl while on Tikosyn and in the 2-3 days prior to Tikosyn initiation.  

## 2018-02-03 ENCOUNTER — Telehealth (HOSPITAL_COMMUNITY): Payer: Self-pay | Admitting: Pharmacist

## 2018-02-03 NOTE — Progress Notes (Signed)
HF MD: [facility]  HPI:  84 y.o.with a h/o coronary artery disease, chronic systolic CHF, and persistent atrial fibrillation was referred by Dr. [name] for evaluation of CHF. He isstatus post angioplasty in 1994 and 1995of his circumflex coronary artery. He had right carotid endarterectomy performed by Dr. Curt Jews June 2013. Given angina, he had cath in 8/17. This showed severe RCA stenosis treated with DES. Healso hada very small ramus branch that was 90% stenosed and not suitable for intervention as well as a 70% mid LAD lesion although he had no evidence of anterior ischemia on his stress test. He was diagnosed in 5/19with newonset atrial fibrillation.Echo was done in 5/19and showed EF decreased to 30-35%, normal EF by echo in August 2017. He had a DCCV in 5/19, unfortunately, he only held NSR for a few days and has been back in atrial fibrillation since then. He had recurrent chest pain and was again cathed in 8/10, this time showing 80% proximal/mid LAD that was treated with DES.   He returns today for pharmacist-led HF medication titration. At last clinic visit on 9/3, he was started on spironolactone 12.5 mg daily. He feels well but is still having daily headaches even with d/c of Imdur.He walks for 30 minutes 5 days/week. When he first starts walking, he will feel short of breath, but this resolves the farther he walks.    Shortness of breath/dyspnea on exertion? no   Orthopnea/PND? no  Edema? no  Lightheadedness/dizziness? no  Daily weights at home? Yes - stable  Blood pressure/heart rate monitoring at home? Yes - SBP ranges 109-159 mmHg  Following low-sodium/fluid-restricted diet? Yes  HF Medications: Carvedilol 6.25 mg PO BID Entresto 49-51 mg PO BID Spironolactone 12.5 mg PO daily   Has the patient been experiencing any side effects to the medications prescribed?  no  Does the patient have any problems obtaining medications due to transportation or  finances?   No - HTA Part D  Understanding of regimen: good Understanding of indications: good Potential of compliance: good Patient understands to avoid NSAIDs. Patient understands to avoid decongestants.   Pertinent Lab Values:  01/13/18: Serum creatinine 1.19, BUN 16, Potassium 4.6, Sodium 143  Vital Signs:  Weight: 180.6 lb (dry weight: 180-183 lb)  Blood pressure: 150/86 mmHg   Heart rate: 74 bpm   Assessment: 1. Chronicsystolic CHF (EF 79-89%), due to ICM/NICM. NYHA class IIsymptoms.  - Volume status stable  - Increase spironolactone to 25 mg daily   - Continue carvedilol 6.25 mg BID and Entresto 49-51 mg BID - Basic disease state pathophysiology, medication indication, mechanism and side effects reviewed at length with patient and he verbalized understanding 2. CAD: Most recently, DES to proximal/mid LAD on 12/26/17. No chest pain since then.  - Continue apixaban 5 mg bid  - Continue Plavix x at least 6 months.  - Stop ASA 01/26/18 (1 month).  - Continue Crestor, good LDL in 5/19.  3. Atrial fibrillation: Persistent. DCCV in 5/19 only held a few days. He has been in atrial fibrillation since then. With CHF/low EF, think he would benefit from resumption of NSR. I doubt this will be successful without an anti-arrhyhmic.  - He is willing to start Tikosyn, atrial fibrillation clinic to set up [place] admission. QTc ok on recent ECG 4. Headaches - Unsure why he is having daily headaches - BP at home controlled and has d/c'd Imdur (most likely medication to cause headaches) - After discussion with Dr. [name], will have patient see  PCP for further work up  Plan: 1) Medication changes: Based on clinical presentation, vital signs and recent labs will increase spironolactone to 25 mg daily 2) Labs: BMET + CBC on Monday since lab didn't receive blood work today 3) Follow-up: Dr. [name] on 03/18/18   [name], PharmD, BCPS, CPP Clinical Pharmacist Phone:  [phone] 01/28/2018 8:42 AM

## 2018-02-03 NOTE — Telephone Encounter (Signed)
Dillon Young called stating that he has had a headache since early this morning. He is not having any other symptoms and 2 Tylenol have only helped slightly. His BP is 134/93 mmHg. He did decrease his Imdur dose per Roderic Palau last week to 15 mg QAM. Per discussion with Dr. [name], will d/c Imdur completely to see if this helps with his headaches. I have advised him to call 911 if the headache is associated with chest pain, numbness, blurry vision and/or nausea and vomiting and to continue to write down his blood pressures especially when he has the headaches. Patient verbalized understanding and was grateful for the advice.   [name], PharmD, BCPS, CPP Clinical Pharmacist Phone: [phone] 02/03/2018 10:54 AM

## 2018-02-05 DIAGNOSIS — L820 Inflamed seborrheic keratosis: Secondary | ICD-10-CM | POA: Diagnosis not present

## 2018-02-05 DIAGNOSIS — L570 Actinic keratosis: Secondary | ICD-10-CM | POA: Diagnosis not present

## 2018-02-05 DIAGNOSIS — L218 Other seborrheic dermatitis: Secondary | ICD-10-CM | POA: Diagnosis not present

## 2018-02-05 DIAGNOSIS — X32XXXD Exposure to sunlight, subsequent encounter: Secondary | ICD-10-CM | POA: Diagnosis not present

## 2018-02-06 ENCOUNTER — Ambulatory Visit (INDEPENDENT_AMBULATORY_CARE_PROVIDER_SITE_OTHER)
Admission: RE | Admit: 2018-02-06 | Discharge: 2018-02-06 | Disposition: A | Discharge status: Home / Self Care | Payer: PPO | Source: Ambulatory Visit | Attending: Family | Admitting: Family

## 2018-02-06 ENCOUNTER — Encounter: Payer: Self-pay | Admitting: Family

## 2018-02-06 ENCOUNTER — Ambulatory Visit (HOSPITAL_COMMUNITY)
Admission: RE | Admit: 2018-02-06 | Discharge: 2018-02-06 | Disposition: A | Discharge status: Home / Self Care | Payer: PPO | Source: Ambulatory Visit | Attending: Cardiology | Admitting: Cardiology

## 2018-02-06 ENCOUNTER — Ambulatory Visit (INDEPENDENT_AMBULATORY_CARE_PROVIDER_SITE_OTHER): Payer: PPO | Admitting: Family

## 2018-02-06 ENCOUNTER — Telehealth (HOSPITAL_COMMUNITY): Payer: Self-pay | Admitting: Pharmacist

## 2018-02-06 VITALS — BP 150/86 | HR 74 | Wt 180.6 lb

## 2018-02-06 DIAGNOSIS — I5022 Chronic systolic (congestive) heart failure: Secondary | ICD-10-CM | POA: Insufficient documentation

## 2018-02-06 DIAGNOSIS — R51 Headache: Secondary | ICD-10-CM | POA: Diagnosis not present

## 2018-02-06 DIAGNOSIS — G44319 Acute post-traumatic headache, not intractable: Secondary | ICD-10-CM

## 2018-02-06 DIAGNOSIS — I481 Persistent atrial fibrillation: Secondary | ICD-10-CM | POA: Insufficient documentation

## 2018-02-06 DIAGNOSIS — I428 Other cardiomyopathies: Secondary | ICD-10-CM | POA: Insufficient documentation

## 2018-02-06 DIAGNOSIS — Z79899 Other long term (current) drug therapy: Secondary | ICD-10-CM | POA: Diagnosis not present

## 2018-02-06 DIAGNOSIS — I519 Heart disease, unspecified: Secondary | ICD-10-CM | POA: Diagnosis present

## 2018-02-06 DIAGNOSIS — Z9861 Coronary angioplasty status: Secondary | ICD-10-CM | POA: Diagnosis not present

## 2018-02-06 DIAGNOSIS — I2510 Atherosclerotic heart disease of native coronary artery without angina pectoris: Secondary | ICD-10-CM | POA: Diagnosis not present

## 2018-02-06 MED ORDER — SPIRONOLACTONE 25 MG PO TABS: 25.0000 mg | ORAL_TABLET | Freq: Every day | ORAL | 5 refills | Status: DC

## 2018-02-06 NOTE — Progress Notes (Signed)
Dillon Young is a 84 y.o. male with the following history as recorded in EpicCare:  Patient Active Problem List   Diagnosis Date Noted  . CAD (coronary artery disease) 12/26/2017  . Left ventricular dysfunction 11/06/2017  . Persistent atrial fibrillation ([place]) 10/02/2017  . RTI (respiratory tract infection) 08/14/2017  . Aortic atherosclerosis ([place]) 05/02/2017  . Cough 03/31/2016  . Primary osteoarthritis of both knees 03/13/2016  . Arrhythmia 01/10/2016  . Bradycardia 12/15/2015  . Routine general medical examination at a health care facility 08/16/2015  . Middle insomnia 08/15/2015  . Carotid artery disease- s/p RCE June 2013 11/01/2011  . PVC's (premature ventricular contractions) 05/07/2011  . Radiation proctitis 03/30/2011  . Hemorrhoids, internal, with bleeding, prolapse and itching 02/02/2011  . Constipation, chronic 02/02/2011  . CAD S/P multiple PCIs 09/08/2008  . Cerebrovascular disease 09/08/2008  . Allergic rhinitis 09/08/2008  . Diabetes mellitus without complication ([place]) 16/02/9603  . HISTORY OF ADENOCARCINOMA, PROSTATE 09/06/2008    Class: History of  . Hyperlipidemia with target LDL less than 70 07/02/2008  . Essential hypertension 07/02/2008  . GERD 07/02/2008  . Irritable bowel syndrome 07/02/2008  . LOW BACK PAIN SYNDROME 07/02/2008    Current Outpatient Medications  Medication Sig Dispense Refill  . apixaban (ELIQUIS) 5 MG TABS tablet Take 1 tablet (5 mg total) by mouth 2 (two) times daily. 180 tablet 01  . carvedilol (COREG) 6.25 MG tablet Take 1 tablet (6.25 mg total) by mouth 2 (two) times daily. 180 tablet 1  . cholecalciferol (VITAMIN D) 1000 UNITS tablet Take 1,000 Units by mouth at bedtime.     . clopidogrel (PLAVIX) 75 MG tablet Take 1 tablet (75 mg total) by mouth daily with breakfast. 90 tablet 3  . fluticasone (FLONASE) 50 MCG/ACT nasal spray Place 1-2 sprays into both nostrils daily as needed for allergies. 48 g 1  . hydrocortisone  (ANUSOL-HC) 2.5 % rectal cream Place 1 application rectally 2 (two) times daily as needed for hemorrhoids.     . Hypromellose (ARTIFICIAL TEARS OP) Place 1 drop into both eyes 4 (four) times daily as needed (dry eyes).    . Melatonin 10 MG TABS Take 10 mg by mouth at bedtime.    . nitroGLYCERIN (NITROSTAT) 0.4 MG SL tablet Place 1 tablet (0.4 mg total) under the tongue every 5 (five) minutes as needed for chest pain. 25 tablet 3  . rosuvastatin (CRESTOR) 10 MG tablet Take 10 mg by mouth daily.    . sacubitril-valsartan (ENTRESTO) 49-51 MG Take 1 tablet by mouth 2 (two) times daily. 60 tablet 11  . spironolactone (ALDACTONE) 25 MG tablet Take 1 tablet (25 mg total) by mouth daily. 30 tablet 5  . vitamin C (ASCORBIC ACID) 500 MG tablet Take 500 mg by mouth at bedtime.     . Wheat Dextrin (BENEFIBER PO) Take 2 Scoops by mouth daily.      No current facility-administered medications for this visit.     Allergies: Lipitor [atorvastatin]  Past Medical History:  Diagnosis Date  . Adenomatous colon polyp   . Allergic rhinitis   . BPH (benign prostatic hyperplasia)   . CAD (coronary artery disease)    sees Dr. [name]   . Carotid artery disease ([place])   . CHF (congestive heart failure) ([place])   . Chronic kidney disease    [place]. Prostate cancer  . Clotting disorder ([place])   . CVA (cerebral infarction)   . Diabetes mellitus    "Borderline"  . Diverticulitis   .  GERD (gastroesophageal reflux disease)    history  . Hemorrhoids   . HTN (hypertension)   . Hyperlipidemia LDL goal <70   . IBS (irritable bowel syndrome)   . Low back pain   . Prostate cancer ([place]) 2008   seed implantation  . Sleep apnea    last study was aborted.  Has never worn a CPAP.    Marland Kitchen Smoker     Past Surgical History:  Procedure Laterality Date  . APPENDECTOMY    . CARDIAC CATHETERIZATION N/A 01/16/2016   Procedure: Left Heart Cath and Coronary Angiography;  Surgeon: Jettie Booze, MD;  Location: [facility];  Service: Cardiovascular;  Laterality: N/A;  . CARDIAC CATHETERIZATION N/A 01/16/2016   Procedure: Coronary Stent Intervention;  Surgeon: Jettie Booze, MD;  Location: [facility];  Service: Cardiovascular;  Laterality: N/A;  . CARDIOVERSION N/A 11/26/2017   Procedure: CARDIOVERSION;  Surgeon: [name], MD;  Location: [facility] ENDOSCOPY;  Service: Cardiovascular;  Laterality: N/A;  . CATARACT EXTRACTION W/ INTRAOCULAR LENS  IMPLANT, BILATERAL Bilateral   . COLONOSCOPY  10/21/2008   diverticulosis, radiation proctitis, internal hemorrhoids  . CORONARY STENT INTERVENTION N/A 12/26/2017   Procedure: CORONARY STENT INTERVENTION;  Surgeon: [name], MD;  Location: [place] [facility];  Service: Cardiovascular;  Laterality: N/A;  . ENDARTERECTOMY  11/12/2011   Procedure: ENDARTERECTOMY CAROTID;  Surgeon: Rosetta Posner, MD;  Location: [facility] OR;  Service: Vascular;  Laterality: Right;  . ESOPHAGOGASTRODUODENOSCOPY (EGD) WITH ESOPHAGEAL DILATION    . HEMORRHOID BANDING    . LEFT HEART CATH AND CORONARY ANGIOGRAPHY N/A 12/26/2017   Procedure: LEFT HEART CATH AND CORONARY ANGIOGRAPHY;  Surgeon: [name], MD;  Location: [place] [facility];  Service: Cardiovascular;  Laterality: N/A;  . LITHOTRIPSY  X 3  . LUMBAR LAMINECTOMY    . TRANSURETHRAL RESECTION OF PROSTATE  10/2003    Family History  Problem Relation Age of Onset  . Heart disease Father   . Hypertension Mother   . Asthma Paternal Grandmother   . Colon cancer Neg Hx   . Esophageal cancer Neg Hx   . Stomach cancer Neg Hx   . Rectal cancer Neg Hx   . Liver cancer Neg Hx     Social History   Tobacco Use  . Smoking status: Former Smoker    Packs/day: 1.50    Years: 60.00    Pack years: 90.00    Types: Cigarettes    Last attempt to quit: 07/25/2008    Years since quitting: 9.5  . Smokeless tobacco: Never Used  Substance Use Topics  . Alcohol use: Yes    Alcohol/week: 0.0 standard drinks    Comment:  Occ    Subjective:  Very pleasant patient presents with concerns for 3 week history of unexplained headache; notes pain is localized in back of his head and radiates up into his head; denies any blurred vision or sinus pain or pressure; denies any dizziness; not prone to headaches; Is concerned that one of his medications could be causing symptoms or that his blood pressure is not as well controlled as it should be; saw Pharm D who works with his cardiologist today- was told to stop Imdur and check blood pressure regularly; Does mention while talking today that he hit the back of his head with a pull-down attic door- thinks the injury may have occurred about the time the symptoms started; does take Eliquis; In reviewing charts, numerous medications  changes have been made for him in the past few months; however, it appears most recent med change was in June; blood pressure does not appear to have been well controlled for the past month in reviewing flowsheet documented in system.   Objective:  Vitals:   02/06/18 1313  BP: (!) 148/86  Pulse: 66  Temp: (!) 97.5 F (36.4 C)  TempSrc: Oral  SpO2: 96%  Weight: 180 lb 0.6 oz (81.7 kg)  Height: 5' 8.5" (1.74 m)    General: Well developed, well nourished, in no acute distress  Skin : Warm and dry.  Head: Normocephalic and atraumatic  Eyes: Sclera and conjunctiva clear; pupils round and reactive to light; extraocular movements intact  Ears: External normal; canals clear; tympanic membranes normal  Oropharynx: Pink, supple. No suspicious lesions  Neck: Supple without thyromegaly, adenopathy  Lungs: Respirations unlabored; clear to auscultation bilaterally without wheeze, rales, rhonchi  CVS exam: normal rate, regular rhythm, normal S1, S2, no murmurs, rubs, clicks or gallops, irregularly irregular rhythm with rate 66.  Neurologic: Alert and oriented; speech intact; face symmetrical; moves all extremities well; CNII-XII intact without focal deficit   Assessment:  1. Acute post-traumatic headache, not intractable     Plan:  Will update CT today to rule out any type of bleed due to recent head trauma; ? If headache related to blood pressure; assuming CT is normal, will reach out to PharmD with his cardiologist to see about adding low dose amlodipine to current regimen; follow-up to be determined.  No follow-ups on file.  Orders Placed This Encounter  Procedures  . CT Head Wo Contrast    SS. Lori [phone]/ HTA     Standing Status:   Future    Number of Occurrences:   1    Standing Expiration Date:   05/09/2019    Order Specific Question:   ** REASON FOR EXAM (FREE TEXT)    Answer:   Head injury; headaches x 3 weeks;    Order Specific Question:   Preferred imaging location?    Answer:   [place]    Order Specific Question:   Radiology Contrast Protocol - do NOT remove file path    Answer:   [email or web address]    Requested Prescriptions    No prescriptions requested or ordered in this encounter

## 2018-02-06 NOTE — Patient Instructions (Addendum)
Please INCREASE spironolactone to 25 mg (1 tablet) ONCE DAILY.   Blood work today. We will call you with any changes.   Please make an appointment with your PCP to evaluate your headaches.   Please keep your appointment with Dr. [name] on 03/18/18.

## 2018-02-06 NOTE — Telephone Encounter (Signed)
Lab states that they never received Mr. Hazelrigg's blood work today so will have him come back tomorrow for BMET and CBC.   [name], PharmD, BCPS, CPP Clinical Pharmacist Phone: [phone] 02/06/2018 1:51 PM

## 2018-02-07 ENCOUNTER — Ambulatory Visit (HOSPITAL_COMMUNITY)
Admission: RE | Admit: 2018-02-07 | Discharge: 2018-02-07 | Disposition: A | Discharge status: Home / Self Care | Payer: PPO | Source: Ambulatory Visit | Attending: Internal Medicine | Admitting: Internal Medicine

## 2018-02-07 ENCOUNTER — Other Ambulatory Visit (HOSPITAL_COMMUNITY): Payer: PPO

## 2018-02-07 ENCOUNTER — Other Ambulatory Visit (HOSPITAL_COMMUNITY): Payer: Self-pay | Admitting: Pharmacist

## 2018-02-07 ENCOUNTER — Encounter (HOSPITAL_COMMUNITY): Payer: Self-pay | Admitting: *Deleted

## 2018-02-07 DIAGNOSIS — R001 Bradycardia, unspecified: Secondary | ICD-10-CM

## 2018-02-07 DIAGNOSIS — I5022 Chronic systolic (congestive) heart failure: Secondary | ICD-10-CM

## 2018-02-07 LAB — BASIC METABOLIC PANEL
ANION GAP: 10 (ref 5–15)
Anion gap: 7 (ref 5–15)
BUN: 22 mg/dL (ref 8–23)
BUN: 22 mg/dL (ref 8–23)
CALCIUM: 9.3 mg/dL (ref 8.9–10.3)
CALCIUM: 9.4 mg/dL (ref 8.9–10.3)
CO2: 26 mmol/L (ref 22–32)
CO2: 28 mmol/L (ref 22–32)
CREATININE: 1.22 mg/dL (ref 0.61–1.24)
Chloride: 102 mmol/L (ref 98–111)
Chloride: 106 mmol/L (ref 98–111)
Creatinine, Ser: 1.21 mg/dL (ref 0.61–1.24)
GFR calc Af Amer: >60 mL/min (ref >60)
GFR calc Af Amer: >60 mL/min (ref >60)
GFR, EST NON AFRICAN AMERICAN: 54 mL/min — AB (ref >60)
GFR, EST NON AFRICAN AMERICAN: 53 mL/min — AB (ref >60)
GLUCOSE: 135 mg/dL — AB (ref 70–99)
GLUCOSE: 174 mg/dL — AB (ref 70–99)
Potassium: 5.7 mmol/L — ABNORMAL HIGH (ref 3.5–5.1)
Potassium: 4.4 mmol/L (ref 3.5–5.1)
Sodium: 138 mmol/L (ref 135–145)
Sodium: 141 mmol/L (ref 135–145)

## 2018-02-07 LAB — CBC
HCT: 47.3 % (ref 39.0–52.0)
Hemoglobin: 15.3 g/dL (ref 13.0–17.0)
MCH: 27.9 pg (ref 26.0–34.0)
MCHC: 32.3 g/dL (ref 30.0–36.0)
MCV: 86.2 fL (ref 78.0–100.0)
PLATELETS: 174 10*3/uL (ref 150–400)
RBC: 5.49 MIL/uL (ref 4.22–5.81)
RDW: 13.3 % (ref 11.5–15.5)
WBC: 4.9 10*3/uL (ref 4.0–10.5)

## 2018-02-10 ENCOUNTER — Telehealth (HOSPITAL_COMMUNITY): Payer: Self-pay | Admitting: *Deleted

## 2018-02-10 ENCOUNTER — Other Ambulatory Visit (HOSPITAL_COMMUNITY): Payer: PPO

## 2018-02-10 ENCOUNTER — Telehealth: Payer: Self-pay | Admitting: Family

## 2018-02-10 NOTE — Telephone Encounter (Signed)
Spoke with patient and info given 

## 2018-02-10 NOTE — Telephone Encounter (Signed)
Can you please let him know I did hear back from the Pharm D at his cardiology office? They do have some things they can do to adjust his medications and hopefully help with the headaches. Can he call them with his blood pressure readings and they can make adjustments as needed.   Ileene Patrick is who he saw; her number is [phone]

## 2018-02-10 NOTE — Telephone Encounter (Signed)
Pt called to see if labs were ok from Friday, he states he was her 3 times for lab work and he is very upset about this.  He states the rist time they told him they lost his blood so he had to come back then they sent paramedics out to his home to tell him his K level was to high and he had to come again and recheck it.  Pt was stuck 3 times for labs and then never got the final results to know if it was ok or not.  Apologized for all of these issues and let him know labs were ok, he is thankful for info

## 2018-02-10 NOTE — Telephone Encounter (Signed)
-----   Message from Adora Fridge, [place] sent at 02/07/2018  1:45 PM EDT ----- Aileen Pilot,   I just increased his spironolactone at his appointment with me but if this doesn't get his BP under better control, our usual course of action would be to increase his Entresto and then from there we would potentially consider Bidil and/or amlodipine. We try to optimize his HF medications that also have a BP lowering effect first before considering other options. Thanks so much though for seeing him on such short notice. He was really worrying me with those headaches.   Thanks again! Doroteo Bradford  ----- Message ----- From: Marrian Salvage, FNP Sent: 02/07/2018  10:43 AM EDT To: Adora Fridge, RPH-CPP  Dr. Velva Harman,  I was writing to you about this pleasant gentleman. I saw him yesterday with complaint of 3 week headache. I updated CT to rule out any type of bleed as he had hit his head in the past few weeks. I am wondering if his blood pressure is the source. I wanted to ask if it would be appropriate to try adding a low dose of Amlodipine ( even 2.5 mg). I didn't want to complicate what your group is trying to do though.  Thanks for any thoughts you have- Jodi Mourning, FNP-BC

## 2018-02-18 ENCOUNTER — Telehealth (HOSPITAL_COMMUNITY): Payer: Self-pay | Admitting: Pharmacist

## 2018-02-18 NOTE — Telephone Encounter (Signed)
Dillon Young called stating that his headaches are almost gone since increasing his spironolactone but in the last 2 days he has been feeling more dizzy with BP 101/78-109/68. I have advised him to try to take it in the evening to see if this helps. If not, I have advised him to go back to 1/2 tablet daily. Patient verbalized understanding and was grateful for the advice.   [name], PharmD, BCPS, CPP Clinical Pharmacist Phone: [phone] 02/18/2018 11:41 AM

## 2018-02-24 ENCOUNTER — Telehealth (HOSPITAL_COMMUNITY): Payer: Self-pay | Admitting: *Deleted

## 2018-02-24 NOTE — Telephone Encounter (Signed)
I cld pt to reschedule tikosyn admission since we were informed that there is no cardioversion slot available.  Pt became very angry and yelled that we have not put him first and that this is the second time this admission had been put off for some stupid excuse.  The spouse also got on the line hollering at me that they have been putting their life on hold for this.  The patient told his wife that I had told him he was not important enough and I explained that I have not said that and would not.  He said it is all the same to him. The spouse was hollering in the background and said just dont talk to her anymore and the pt hung up on me.  I had offered previously to him hanging up to sched for next Tuesday.  He never okayed moving the appointment.

## 2018-02-25 ENCOUNTER — Ambulatory Visit (HOSPITAL_COMMUNITY): Payer: PPO | Admitting: Nurse Practitioner

## 2018-02-25 ENCOUNTER — Telehealth (HOSPITAL_COMMUNITY): Payer: Self-pay | Admitting: *Deleted

## 2018-02-25 ENCOUNTER — Inpatient Hospital Stay: Admission: AD | Admit: 2018-02-25 | Payer: PPO | Source: Ambulatory Visit | Admitting: Internal Medicine

## 2018-02-25 NOTE — Telephone Encounter (Signed)
Patient called back in today - I had called to offer tikosyn due to cancelation. Pt has decided to hold off on pursuing tikosyn admission at this point. He will call back when he decides proceed. [facility]

## 2018-02-26 ENCOUNTER — Telehealth: Payer: Self-pay

## 2018-02-26 NOTE — Telephone Encounter (Signed)
Left a message for Mr. Dillon Young to call back in regards to the referral for the PREP at the [facility]

## 2018-02-27 ENCOUNTER — Ambulatory Visit (HOSPITAL_COMMUNITY): Admit: 2018-02-27 | Payer: PPO | Admitting: Cardiovascular Disease

## 2018-02-27 ENCOUNTER — Encounter (HOSPITAL_COMMUNITY): Payer: Self-pay

## 2018-02-27 SURGERY — CARDIOVERSION
Anesthesia: General

## 2018-02-27 NOTE — Telephone Encounter (Signed)
This encounter was created in error - please disregard.

## 2018-02-28 ENCOUNTER — Encounter (HOSPITAL_COMMUNITY): Payer: PPO | Admitting: Cardiology

## 2018-03-03 ENCOUNTER — Telehealth: Payer: Self-pay

## 2018-03-03 NOTE — Telephone Encounter (Signed)
Spoke to Dillon Young about the PREP as referred by the AFIB clinic.  He is currently doing 4-5 days of walking a week and is interested in the PREP at [facility].  The next new class he will be able to attend will begin in the new year.  We have agreed to touch base toward the end of Dec. '19 to schedule an intake appointment.

## 2018-03-04 ENCOUNTER — Telehealth: Payer: Self-pay | Admitting: Cardiovascular Disease

## 2018-03-04 NOTE — Telephone Encounter (Signed)
Pt called to be sure that he has been taking the correct dose of his Coreg.

## 2018-03-04 NOTE — Telephone Encounter (Signed)
New Message:     Pt c/o medication issue:  1. Name of Medication: carvedilol (COREG) 6.25 MG tablet  2. How are you currently taking this medication (dosage and times per day)? Take 1 tablet (6.25 mg total) by mouth 2 (two) times daily.  3. Are you having a reaction (difficulty breathing--STAT)? No   4. What is your medication issue? Patient has a question about the dosage of the medication

## 2018-03-09 NOTE — Progress Notes (Signed)
Cardiology Office Note   Date:  03/10/2018   ID:  Dillon Young, DOB 12/29/1932, MRN [id number]  PCP:  [name], MD Cardiologist:  [name], MD 12/27/2017 in-hospital Afib clinic: Orson Eva, NP 01/31/2018 CHF: Dr [name] 01/06/2018 [name], PA-C 12/18/2017   History of Present Illness: Dillon Young is a 84 y.o. male with a history of PCI CFX [place], HTN, [place], R-CEA 2013, DES RCA 12/2015 w/ RI 90% and LAD 70%>>med rx, Afib dx 09/2017, EF now 30-35%, on Eliquis/ASA and off Plavix,probable sleep apnea but does not feel he would tolerate CPAP, sleeps in a recliner, CHA2DS2-VASc = 5 (age x 2, CAD, HTN, CHF)  09/06 office visit at Afib clinic, plan for Tikosyn hospitalization 10/01. 09/08, phone note regarding HA, d/c Imdur 09/09 Pharmacist visit, spiro 12.5>>25 mg, stop ASA 09/01 09/12 PCP office visit, Head CT scheduled 09/30 pt was called because Tikosyn admit to be rescheduled, very angry 10/01, spot for DCCV opened, but pt decided not to pursue Tikosyn, wants referral for PREP at the [facility] presents for cardiology follow up.  He feels some of his medications are making him depressed.   Home BP records show BP generally well-controlled. Range SBP 100-133, DBP was 110 (once) but otherwise 56-83.   He is rarely aware of his heart rate, does not generally get palpitations.  However, he has noticed an elevated heart rate at times when he checks his blood pressure.  The heart rates are not documented.  Still has DOE at times. However, he can walk and get better.  He has not had chest pain.  No LE edema, no orthopnea or PND.  He has been losing weight, he has been watching what he eats.  He admits that he probably said some things to the staff over the phone that he should not have.   Past Medical History:  Diagnosis Date  . Adenomatous colon polyp   . Allergic rhinitis   . BPH (benign prostatic hyperplasia)   . CAD  (coronary artery disease)    sees Dr. [name]   . Carotid artery disease ([place])   . CHF (congestive heart failure) ([place])   . Chronic kidney disease    [place]. Prostate cancer  . Clotting disorder ([place])   . CVA (cerebral infarction)   . Diabetes mellitus    "Borderline"  . Diverticulitis   . GERD (gastroesophageal reflux disease)    history  . Hemorrhoids   . HTN (hypertension)   . Hyperlipidemia LDL goal <70   . IBS (irritable bowel syndrome)   . Low back pain   . Prostate cancer ([place]) 2008   seed implantation  . Sleep apnea    last study was aborted.  Has never worn a CPAP.    Marland Kitchen Smoker     Past Surgical History:  Procedure Laterality Date  . APPENDECTOMY    . CARDIAC CATHETERIZATION N/A 01/16/2016   Procedure: Left Heart Cath and Coronary Angiography;  Surgeon: Jettie Booze, MD;  Location: [facility];  Service: Cardiovascular;  Laterality: N/A;  . CARDIAC CATHETERIZATION N/A 01/16/2016   Procedure: Coronary Stent Intervention;  Surgeon: Jettie Booze, MD;  Location: [facility];  Service: Cardiovascular;  Laterality: N/A;  . CARDIOVERSION N/A 11/26/2017   Procedure: CARDIOVERSION;  Surgeon: [name], MD;  Location: [facility];  Service: Cardiovascular;  Laterality: N/A;  . CATARACT EXTRACTION W/ INTRAOCULAR LENS  IMPLANT, BILATERAL Bilateral   . COLONOSCOPY  10/21/2008   diverticulosis, radiation proctitis, internal hemorrhoids  . CORONARY STENT INTERVENTION N/A 12/26/2017   Procedure: CORONARY STENT INTERVENTION;  Surgeon: [name], MD;  Location: [facility];  Service: Cardiovascular;  Laterality: N/A;  . ENDARTERECTOMY  11/12/2011   Procedure: ENDARTERECTOMY CAROTID;  Surgeon: Rosetta Posner, MD;  Location: [facility];  Service: Vascular;  Laterality: Right;  . ESOPHAGOGASTRODUODENOSCOPY (EGD) WITH ESOPHAGEAL DILATION    . HEMORRHOID BANDING    . LEFT HEART CATH AND CORONARY ANGIOGRAPHY N/A 12/26/2017   Procedure: LEFT HEART  CATH AND CORONARY ANGIOGRAPHY;  Surgeon: [name], MD;  Location: [facility];  Service: Cardiovascular;  Laterality: N/A;  . LITHOTRIPSY  X 3  . LUMBAR LAMINECTOMY    . TRANSURETHRAL RESECTION OF PROSTATE  10/2003    Current Outpatient Medications  Medication Sig Dispense Refill  . apixaban (ELIQUIS) 5 MG TABS tablet Take 1 tablet (5 mg total) by mouth 2 (two) times daily. 180 tablet 01  . carvedilol (COREG) 6.25 MG tablet Take 1 tablet (6.25 mg total) by mouth 2 (two) times daily. 180 tablet 1  . cholecalciferol (VITAMIN D) 1000 UNITS tablet Take 1,000 Units by mouth at bedtime.     . clopidogrel (PLAVIX) 75 MG tablet Take 1 tablet (75 mg total) by mouth daily with breakfast. 90 tablet 3  . fluticasone (FLONASE) 50 MCG/ACT nasal spray Place 1-2 sprays into both nostrils daily as needed for allergies. 48 g 1  . hydrocortisone (ANUSOL-HC) 2.5 % rectal cream Place 1 application rectally 2 (two) times daily as needed for hemorrhoids.     . Hypromellose (ARTIFICIAL TEARS OP) Place 1 drop into both eyes 4 (four) times daily as needed (dry eyes).    . Melatonin 10 MG TABS Take 10 mg by mouth at bedtime.    . nitroGLYCERIN (NITROSTAT) 0.4 MG SL tablet Place 1 tablet (0.4 mg total) under the tongue every 5 (five) minutes as needed for chest pain. 25 tablet 3  . rosuvastatin (CRESTOR) 10 MG tablet Take 10 mg by mouth daily.    . sacubitril-valsartan (ENTRESTO) 49-51 MG Take 1 tablet by mouth 2 (two) times daily. 60 tablet 11  . spironolactone (ALDACTONE) 25 MG tablet Take 1 tablet (25 mg total) by mouth daily. 30 tablet 5  . vitamin C (ASCORBIC ACID) 500 MG tablet Take 500 mg by mouth at bedtime.     . Wheat Dextrin (BENEFIBER PO) Take 2 Scoops by mouth daily.      No current facility-administered medications for this visit.     Allergies:   Lipitor [atorvastatin]    Social History:  The patient  reports that he quit smoking about 9 years ago. His smoking use included cigarettes.  He has a 90.00 pack-year smoking history. He has never used smokeless tobacco. He reports that he drinks alcohol. He reports that he does not use drugs.   Family History:  The patient's family history includes Asthma in his paternal grandmother; Heart disease in his father; Hypertension in his mother.  He indicated that his mother is deceased. He indicated that his father is deceased. He indicated that his paternal grandmother is deceased. He indicated that the status of his neg hx is unknown.    ROS:  Please see the history of present illness. All other systems are reviewed and negative.    PHYSICAL EXAM: VS:  BP (!) 142/62   Pulse 78  Ht 5\' 9"  (1.753 m)   Wt 176 lb 9.6 oz (80.1 kg)   BMI 26.08 kg/m  , BMI Body mass index is 26.08 kg/m. GEN: Well nourished, well developed, male in no acute distress HEENT: normal for age  Neck: no JVD, R>L carotid bruit, no masses Cardiac: Irregular rate and rhythm; no murmur, no rubs, or gallops Respiratory: Slightly decreased breath sounds bases bilaterally, normal work of breathing GI: soft, nontender, nondistended, + BS MS: no deformity or atrophy; no edema; distal pulses are 2+ in all 4 extremities  Skin: warm and dry, no rash Neuro:  Strength and sensation are intact Psych: euthymic mood, full affect   EKG:  EKG is not ordered today.   ECHO: 10/07/2017 - Left ventricle: The cavity size was severely dilated. Systolic   function was moderately to severely reduced. The estimated   ejection fraction was in the range of 30% to 35%. There is   hypokinesis of the lateral myocardium. There is akinesis of the   inferolateral and apical myocardium. The study was not   technically sufficient to allow evaluation of LV diastolic   dysfunction due to atrial fibrillation. - Aorta: Maximal diameter of abdominal aorta: 41 mm. - Abdominal aorta: The abdominal aorta was moderately dilated. - Tricuspid valve: There was trivial regurgitation. - Pulmonic  valve: There was trivial regurgitation.  CATH: 12/26/2017 CORONARY STENT INTERVENTION  LEFT HEART CATH AND CORONARY ANGIOGRAPHY  Conclusion     Previously placed Prox RCA to Mid RCA stent (unknown type) is widely patent.  Prox LAD to Mid LAD lesion is 80% stenosed.  A stent was successfully placed.  Post intervention, there is a 0% residual stenosis.  There is severe left ventricular systolic dysfunction.  LV end diastolic pressure is mildly elevated.  The left ventricular ejection fraction is less than 25% by visual estimate.   IMPRESSION:Dillon Young has a widely patent mid RCA stent with what appears to be a significant mid LAD lesion. I performed PCI and drug-eluting stenting with an excellent result. This may be the culprit lesion responsible for his chest pain. He does have severe LV dysfunction with an EF in the 20 to 25% range which I suspect is nonischemic. Angiomax will continue for 4 hours full dose. The sheath will be removed and pressure held. Patient will be gently hydrated and discharged home in the morning on dual antiplatelet therapy. Apixaban will start back tomorrow and he will continue with on aspirin, Plavix and apixaban for 1 month after which the aspirin will be discontinued.  Diagnostic Diagram       Post-Intervention Diagram          MONITOR: 10/16/2017 Atrial fib/flutter w/ aberrantly conducted bts vs PVCs   Recent Labs: 10/02/2017: ALT 13 11/20/2017: Magnesium 2.1; TSH 2.346 02/07/2018: BUN 22; Creatinine, Ser 1.22; Hemoglobin 15.3; Platelets 174; Potassium 4.4; Sodium 141  CBC    Component Value Date/Time   WBC 4.9 02/07/2018 0904   RBC 5.49 02/07/2018 0904   HGB 15.3 02/07/2018 0904   HGB 15.3 12/18/2017 1049   HCT 47.3 02/07/2018 0904   HCT 48.3 12/18/2017 1049   PLT 174 02/07/2018 0904   PLT 185 12/18/2017 1049   MCV 86.2 02/07/2018 0904   MCV 87 12/18/2017 1049   MCH 27.9 02/07/2018 0904   MCHC 32.3 02/07/2018 0904    RDW 13.3 02/07/2018 0904   RDW 14.5 12/18/2017 1049   LYMPHSABS 1.0 12/18/2017 1049   MONOABS 0.6 05/03/2016 1322   EOSABS  0.1 12/18/2017 1049   BASOSABS 0.0 12/18/2017 1049   CMP Latest Ref Rng & Units 02/07/2018 02/07/2018 01/13/2018  Glucose 70 - 99 mg/dL 174(H) 135(H) 145(H)  BUN 8 - 23 mg/dL 22 22 16   Creatinine 0.61 - 1.24 mg/dL 1.22 1.21 1.19  Sodium 135 - 145 mmol/L 141 138 143  Potassium 3.5 - 5.1 mmol/L 4.4 5.7(H) 4.6  Chloride 98 - 111 mmol/L 106 102 105  CO2 22 - 32 mmol/L 28 26 34(H)  Calcium 8.9 - 10.3 mg/dL 9.4 9.3 9.4  Total Protein 6.0 - 8.5 g/dL - - -  Total Bilirubin 0.0 - 1.2 mg/dL - - -  Alkaline Phos 39 - 117 IU/L - - -  AST 0 - 40 IU/L - - -  ALT 0 - 44 IU/L - - -     Lipid Panel    Component Value Date/Time   CHOL 121 10/02/2017 1011   TRIG 127 10/02/2017 1011   HDL 46 10/02/2017 1011   CHOLHDL 2.6 10/02/2017 1011   CHOLHDL 2.8 07/27/2016 0912   VLDL 25 07/27/2016 0912   LDLCALC 50 10/02/2017 1011     Wt Readings from Last 3 Encounters:  03/10/18 176 lb 9.6 oz (80.1 kg)  02/06/18 180 lb 9.6 oz (81.9 kg)  02/06/18 180 lb 0.6 oz (81.7 kg)     Other studies Reviewed: Additional studies/ records that were reviewed today include: Office notes, hospital records and testing.  ASSESSMENT AND PLAN:  1.  Persistent atrial fibrillation: The risks and benefits of Tikosyn were discussed, the reason for hospitalization being required to start it, etc. -He is interested in sinus rhythm, to see if that will help some of his general malaise, fatigue and other symptoms. -He was set up with the A. fib clinic, they will arrange his admission.  A cardioversion has been tentatively scheduled for Thursday.  This was done after the patient left, so I contacted him by phone and let him know of the appointment.  2.  Chronic anticoagulation: He has not missed any doses of the apixaban.  He is confident in this.  3.  Chronic systolic CHF: He has no volume overload by  exam and his weight is trending down deliberately.  I will not make any med changes.  4.  CAD: He is tolerating the current dose of carvedilol along with Plavix but no aspirin because of the Eliquis.  Continue Crestor, lipids followed by PCP   Current medicines are reviewed at length with the patient today.  The patient does not have concerns regarding medicines.  The following changes have been made:  no change  Labs/ tests ordered today include:  No orders of the defined types were placed in this encounter.    Disposition:   FU with [name], MD  Signed, [name], PA-C  03/10/2018 8:17 AM    [place] Phone: [phone]; Fax: [phone]  This note was written with the assistance of speech recognition software.  Please excuse any transcriptional errors.

## 2018-03-10 ENCOUNTER — Encounter: Payer: Self-pay | Admitting: Physician Assistant

## 2018-03-10 ENCOUNTER — Ambulatory Visit: Payer: PPO | Admitting: Physician Assistant

## 2018-03-10 VITALS — BP 142/62 | HR 78 | Ht 69.0 in | Wt 176.6 lb

## 2018-03-10 DIAGNOSIS — Z7901 Long term (current) use of anticoagulants: Secondary | ICD-10-CM | POA: Diagnosis not present

## 2018-03-10 DIAGNOSIS — I5022 Chronic systolic (congestive) heart failure: Secondary | ICD-10-CM

## 2018-03-10 DIAGNOSIS — I2510 Atherosclerotic heart disease of native coronary artery without angina pectoris: Secondary | ICD-10-CM | POA: Diagnosis not present

## 2018-03-10 DIAGNOSIS — Z9861 Coronary angioplasty status: Secondary | ICD-10-CM | POA: Diagnosis not present

## 2018-03-10 DIAGNOSIS — I4819 Other persistent atrial fibrillation: Secondary | ICD-10-CM

## 2018-03-10 NOTE — Patient Instructions (Signed)
Medication Instructions:  Your physician recommends that you continue on your current medications as directed. Please refer to the Current Medication list given to you today.  If you need a refill on your cardiac medications before your next appointment, please call your pharmacy.   Lab work: None ordered  Testing/Procedures: None ordered  Follow-Up: Keep your follow up appointments as planned   At [facility], you and your health needs are our priority.  As part of our continuing mission to provide you with exceptional heart care, we have created designated Provider Care Teams.  These Care Teams include your primary Cardiologist (physician) and Advanced Practice Providers (APPs -  Physician Assistants and Nurse Practitioners) who all work together to provide you with the care you need, when you need it.  Any Other Special Instructions Will Be Listed Below (If Applicable).

## 2018-03-11 ENCOUNTER — Ambulatory Visit (HOSPITAL_COMMUNITY)
Admission: RE | Admit: 2018-03-11 | Discharge: 2018-03-11 | Disposition: A | Discharge status: Home / Self Care | Payer: PPO | Source: Ambulatory Visit | Attending: Nurse Practitioner | Admitting: Nurse Practitioner

## 2018-03-11 ENCOUNTER — Other Ambulatory Visit: Payer: Self-pay

## 2018-03-11 ENCOUNTER — Encounter (HOSPITAL_COMMUNITY): Payer: Self-pay | Admitting: Nurse Practitioner

## 2018-03-11 ENCOUNTER — Inpatient Hospital Stay (HOSPITAL_COMMUNITY)
Admission: RE | Admit: 2018-03-11 | Discharge: 2018-03-15 | DRG: 309 | Disposition: A | Discharge status: Home / Self Care | Payer: PPO | Source: Ambulatory Visit | Attending: Internal Medicine | Admitting: Internal Medicine

## 2018-03-11 VITALS — BP 134/68 | HR 83 | Ht 69.0 in | Wt 175.0 lb

## 2018-03-11 DIAGNOSIS — K589 Irritable bowel syndrome without diarrhea: Secondary | ICD-10-CM | POA: Diagnosis not present

## 2018-03-11 DIAGNOSIS — I509 Heart failure, unspecified: Secondary | ICD-10-CM | POA: Diagnosis not present

## 2018-03-11 DIAGNOSIS — I493 Ventricular premature depolarization: Secondary | ICD-10-CM | POA: Diagnosis not present

## 2018-03-11 DIAGNOSIS — I5022 Chronic systolic (congestive) heart failure: Secondary | ICD-10-CM | POA: Diagnosis not present

## 2018-03-11 DIAGNOSIS — Z8546 Personal history of malignant neoplasm of prostate: Secondary | ICD-10-CM

## 2018-03-11 DIAGNOSIS — Z8673 Personal history of transient ischemic attack (TIA), and cerebral infarction without residual deficits: Secondary | ICD-10-CM

## 2018-03-11 DIAGNOSIS — Z955 Presence of coronary angioplasty implant and graft: Secondary | ICD-10-CM

## 2018-03-11 DIAGNOSIS — Z79899 Other long term (current) drug therapy: Secondary | ICD-10-CM

## 2018-03-11 DIAGNOSIS — I4891 Unspecified atrial fibrillation: Secondary | ICD-10-CM | POA: Diagnosis not present

## 2018-03-11 DIAGNOSIS — I25118 Atherosclerotic heart disease of native coronary artery with other forms of angina pectoris: Secondary | ICD-10-CM

## 2018-03-11 DIAGNOSIS — Z87891 Personal history of nicotine dependence: Secondary | ICD-10-CM

## 2018-03-11 DIAGNOSIS — N400 Enlarged prostate without lower urinary tract symptoms: Secondary | ICD-10-CM | POA: Diagnosis not present

## 2018-03-11 DIAGNOSIS — E1122 Type 2 diabetes mellitus with diabetic chronic kidney disease: Secondary | ICD-10-CM | POA: Diagnosis not present

## 2018-03-11 DIAGNOSIS — I130 Hypertensive heart and chronic kidney disease with heart failure and stage 1 through stage 4 chronic kidney disease, or unspecified chronic kidney disease: Secondary | ICD-10-CM | POA: Diagnosis not present

## 2018-03-11 DIAGNOSIS — I2510 Atherosclerotic heart disease of native coronary artery without angina pectoris: Secondary | ICD-10-CM | POA: Diagnosis present

## 2018-03-11 DIAGNOSIS — I4819 Other persistent atrial fibrillation: Principal | ICD-10-CM

## 2018-03-11 DIAGNOSIS — K219 Gastro-esophageal reflux disease without esophagitis: Secondary | ICD-10-CM | POA: Diagnosis not present

## 2018-03-11 DIAGNOSIS — N183 Chronic kidney disease, stage 3 (moderate): Secondary | ICD-10-CM | POA: Diagnosis present

## 2018-03-11 DIAGNOSIS — Z7901 Long term (current) use of anticoagulants: Secondary | ICD-10-CM | POA: Diagnosis not present

## 2018-03-11 DIAGNOSIS — Z7902 Long term (current) use of antithrombotics/antiplatelets: Secondary | ICD-10-CM | POA: Diagnosis not present

## 2018-03-11 DIAGNOSIS — Z888 Allergy status to other drugs, medicaments and biological substances status: Secondary | ICD-10-CM

## 2018-03-11 DIAGNOSIS — I110 Hypertensive heart disease with heart failure: Secondary | ICD-10-CM | POA: Diagnosis not present

## 2018-03-11 DIAGNOSIS — E785 Hyperlipidemia, unspecified: Secondary | ICD-10-CM | POA: Diagnosis not present

## 2018-03-11 LAB — BASIC METABOLIC PANEL
ANION GAP: 7 (ref 5–15)
BUN: 19 mg/dL (ref 8–23)
CHLORIDE: 104 mmol/L (ref 98–111)
CO2: 29 mmol/L (ref 22–32)
CREATININE: 1.19 mg/dL (ref 0.61–1.24)
Calcium: 9.4 mg/dL (ref 8.9–10.3)
GFR calc Af Amer: >60 mL/min (ref >60)
GFR calc non Af Amer: 55 mL/min — ABNORMAL LOW (ref >60)
Glucose, Bld: 100 mg/dL — ABNORMAL HIGH (ref 70–99)
POTASSIUM: 4.2 mmol/L (ref 3.5–5.1)
Sodium: 140 mmol/L (ref 135–145)

## 2018-03-11 LAB — MAGNESIUM: Magnesium: 2.1 mg/dL (ref 1.7–2.4)

## 2018-03-11 MED ORDER — POLYVINYL ALCOHOL 1.4 % OP SOLN
1.0000 [drp] | OPHTHALMIC | Status: DC | PRN
  Filled 2018-03-11: qty 15

## 2018-03-11 MED ORDER — SACUBITRIL-VALSARTAN 49-51 MG PO TABS
1.0000 | ORAL_TABLET | Freq: Two times a day (BID) | ORAL | Status: DC
  Administered 2018-03-11 – 2018-03-15 (×7): 1 via ORAL
  Filled 2018-03-11 (×9): qty 1

## 2018-03-11 MED ORDER — SODIUM CHLORIDE 0.9% FLUSH
3.0000 mL | Freq: Two times a day (BID) | INTRAVENOUS | Status: DC
  Administered 2018-03-11 – 2018-03-15 (×7): 3 mL via INTRAVENOUS

## 2018-03-11 MED ORDER — CLOPIDOGREL BISULFATE 75 MG PO TABS
75.0000 mg | ORAL_TABLET | Freq: Every evening | ORAL | Status: DC
  Administered 2018-03-11 – 2018-03-14 (×4): 75 mg via ORAL
  Filled 2018-03-11 (×4): qty 1

## 2018-03-11 MED ORDER — DOFETILIDE 250 MCG PO CAPS
250.0000 ug | ORAL_CAPSULE | Freq: Two times a day (BID) | ORAL | Status: DC
  Administered 2018-03-11 – 2018-03-13 (×5): 250 ug via ORAL
  Filled 2018-03-11 (×5): qty 1

## 2018-03-11 MED ORDER — VITAMIN D 1000 UNITS PO TABS
1000.0000 [IU] | ORAL_TABLET | Freq: Every day | ORAL | Status: DC
  Administered 2018-03-11 – 2018-03-14 (×4): 1000 [IU] via ORAL
  Filled 2018-03-11 (×4): qty 1

## 2018-03-11 MED ORDER — SODIUM CHLORIDE 0.9% FLUSH: 3.0000 mL | INTRAVENOUS | Status: DC | PRN

## 2018-03-11 MED ORDER — VITAMIN C 500 MG PO TABS
500.0000 mg | ORAL_TABLET | Freq: Every day | ORAL | Status: DC
  Administered 2018-03-13 – 2018-03-14 (×2): 500 mg via ORAL
  Filled 2018-03-11 (×3): qty 1

## 2018-03-11 MED ORDER — MELATONIN 3 MG PO TABS
9.0000 mg | ORAL_TABLET | Freq: Every day | ORAL | Status: DC
  Administered 2018-03-11 – 2018-03-13 (×3): 9 mg via ORAL
  Filled 2018-03-11 (×4): qty 3

## 2018-03-11 MED ORDER — APIXABAN 5 MG PO TABS
5.0000 mg | ORAL_TABLET | Freq: Two times a day (BID) | ORAL | Status: DC
  Administered 2018-03-11 – 2018-03-15 (×8): 5 mg via ORAL
  Filled 2018-03-11 (×8): qty 1

## 2018-03-11 MED ORDER — CARVEDILOL 6.25 MG PO TABS
6.2500 mg | ORAL_TABLET | Freq: Two times a day (BID) | ORAL | Status: DC
  Administered 2018-03-11 – 2018-03-15 (×7): 6.25 mg via ORAL
  Filled 2018-03-11 (×7): qty 1

## 2018-03-11 MED ORDER — FLUTICASONE PROPIONATE 50 MCG/ACT NA SUSP
1.0000 | Freq: Every day | NASAL | Status: DC | PRN
  Filled 2018-03-11: qty 16

## 2018-03-11 MED ORDER — HYDROCORTISONE 2.5 % RE CREA
1.0000 "application " | TOPICAL_CREAM | Freq: Two times a day (BID) | RECTAL | Status: DC | PRN
  Filled 2018-03-11: qty 28.35

## 2018-03-11 MED ORDER — SODIUM CHLORIDE 0.9 % IV SOLN
250.0000 mL | INTRAVENOUS | Status: DC | PRN
  Administered 2018-03-13: 11:00:00 via INTRAVENOUS

## 2018-03-11 MED ORDER — SPIRONOLACTONE 25 MG PO TABS
25.0000 mg | ORAL_TABLET | Freq: Every day | ORAL | Status: DC
  Administered 2018-03-12 – 2018-03-15 (×4): 25 mg via ORAL
  Filled 2018-03-11 (×4): qty 1

## 2018-03-11 MED ORDER — ROSUVASTATIN CALCIUM 10 MG PO TABS
10.0000 mg | ORAL_TABLET | Freq: Every day | ORAL | Status: DC
  Administered 2018-03-12 – 2018-03-15 (×4): 10 mg via ORAL
  Filled 2018-03-11 (×4): qty 1

## 2018-03-11 NOTE — Progress Notes (Signed)
Primary Care Physician: [name], MD Referring Physician:Dr. [name] EP; Dr. Soundra Pilon HAYDN HUTSELL is a 84 y.o. male with a h/o coronary artery disease status post angioplasty in 1994 and 1995of his circumflex coronary artery. . He has a history of hypertension and hyperlipidemia on medication. He had right carotid endarterectomy performed by Dr. Curt Jews June 2013.Myoview stress test performed 01/09/16 was notable for ischemia in the RCA distribution. Based on this he underwent cardiac catheterization by Dr. Varanasi8/21/17 revealing a 90% segmental mid dominant RCA stenosis which was stented with a Synergy 3.5 mm x 20 mm long drug-eluting stent. He did have a very small ramus branch that was 90% stenosed and not suitable for intervention as well as a 70% mid LAD lesion although he had no evidence of anterior ischemia on his stress test. His symptoms of chest pain have resolved.  He was diagnosed in May 2019 with new  onset  afib from which the pt is asymptomatic. Echo was done and showed EF decreased to 30-35%, normal EF by echo in August 2017. He has been on Eliquis 5 mg bid since early May without missed doses.  Plavix were stopped at that time, ASA continued and pt was started on carvedilol 6.25 mg bid.He states that he had a sleep study in the past but never slept enough with the study to get good results. He is afraid that he would never be able to tolerate a cpap. Sleeps in a recliner now, " I just feel more comfortable".  F/u in afib clinic 10/03/17, unfortunately cardioversion only held for 1-2 days.He developed  exertional chest discomfort   and was seen by R. Barrett, PA and had a cardiac cath which pt did receive a stent in the LAD. He feels improved but still remains in afib.  He did have to hold anticoagulation for 1-2 days and was back on anticoagulation without interruption on 8/23.  We did discuss trying to restore SR with EF moderately reduced and he would like to purse  after his vacation at the end of the month.  F/u in afib clinic, 01/31/18. He is back in afib clinic to discuss Tikosyn admit.  He has recently seen Dr. [name] and he also encouraged pt to consider Tiksoyn for his LV dysfunction.Marland Kitchen He is feeling better and was hoping he could get by without drug. He will commit but wants to wait until 10/1. He also reports that he has been having h/a's ever since he started Imdur. I discussed with Doroteo Bradford, PharmD, and  Imdur was eventually stopped with resolution of H/A.  Mr. Leabo is now back in afib clinic for tikosyn admit.  No misses eliquis doses and no benadryl use.PharmD has screened drugs and no qt prolonging drugs. He will be applying for drug assistance and papers received and reviewed.  Today, he denies symptoms of palpitations, shortness of breath, orthopnea, PND, lower extremity edema, dizziness, presyncope, syncope, or neurologic sequela.  The patient is tolerating medications without difficulties and is otherwise without complaint today.   Past Medical History:  Diagnosis Date  . Adenomatous colon polyp   . Allergic rhinitis   . BPH (benign prostatic hyperplasia)   . CAD (coronary artery disease)    sees Dr. [name]   . Carotid artery disease ([place])   . CHF (congestive heart failure) ([place])   . Chronic kidney disease    [place]. Prostate cancer  . Clotting disorder ([place])   . CVA (cerebral infarction)   .  Diabetes mellitus    "Borderline"  . Diverticulitis   . GERD (gastroesophageal reflux disease)    history  . Hemorrhoids   . HTN (hypertension)   . Hyperlipidemia LDL goal <70   . IBS (irritable bowel syndrome)   . Low back pain   . Prostate cancer ([employer]) 2008   seed implantation  . Sleep apnea    last study was aborted.  Has never worn a CPAP.    Marland Kitchen Smoker    Past Surgical History:  Procedure Laterality Date  . APPENDECTOMY    . CARDIAC CATHETERIZATION N/A 01/16/2016   Procedure: Left Heart Cath and Coronary Angiography;  Surgeon:  Jettie Booze, MD;  Location: [facility];  Service: Cardiovascular;  Laterality: N/A;  . CARDIAC CATHETERIZATION N/A 01/16/2016   Procedure: Coronary Stent Intervention;  Surgeon: Jettie Booze, MD;  Location: [facility];  Service: Cardiovascular;  Laterality: N/A;  . CARDIOVERSION N/A 11/26/2017   Procedure: CARDIOVERSION;  Surgeon: [name], MD;  Location: [facility] ENDOSCOPY;  Service: Cardiovascular;  Laterality: N/A;  . CATARACT EXTRACTION W/ INTRAOCULAR LENS  IMPLANT, BILATERAL Bilateral   . COLONOSCOPY  10/21/2008   diverticulosis, radiation proctitis, internal hemorrhoids  . CORONARY STENT INTERVENTION N/A 12/26/2017   Procedure: CORONARY STENT INTERVENTION;  Surgeon: [name], MD;  Location: [facility];  Service: Cardiovascular;  Laterality: N/A;  . ENDARTERECTOMY  11/12/2011   Procedure: ENDARTERECTOMY CAROTID;  Surgeon: Rosetta Posner, MD;  Location: [facility] OR;  Service: Vascular;  Laterality: Right;  . ESOPHAGOGASTRODUODENOSCOPY (EGD) WITH ESOPHAGEAL DILATION    . HEMORRHOID BANDING    . LEFT HEART CATH AND CORONARY ANGIOGRAPHY N/A 12/26/2017   Procedure: LEFT HEART CATH AND CORONARY ANGIOGRAPHY;  Surgeon: [name], MD;  Location: [facility];  Service: Cardiovascular;  Laterality: N/A;  . LITHOTRIPSY  X 3  . LUMBAR LAMINECTOMY    . TRANSURETHRAL RESECTION OF PROSTATE  10/2003    Current Outpatient Medications  Medication Sig Dispense Refill  . apixaban (ELIQUIS) 5 MG TABS tablet Take 1 tablet (5 mg total) by mouth 2 (two) times daily. 180 tablet 01  . carvedilol (COREG) 6.25 MG tablet Take 1 tablet (6.25 mg total) by mouth 2 (two) times daily. 180 tablet 1  . cholecalciferol (VITAMIN D) 1000 UNITS tablet Take 1,000 Units by mouth at bedtime.     . clopidogrel (PLAVIX) 75 MG tablet Take 1 tablet (75 mg total) by mouth daily with breakfast. 90 tablet 3  . fluticasone (FLONASE) 50 MCG/ACT nasal spray Place 1-2 sprays into both  nostrils daily as needed for allergies. 48 g 1  . hydrocortisone (ANUSOL-HC) 2.5 % rectal cream Place 1 application rectally 2 (two) times daily as needed for hemorrhoids.     . Hypromellose (ARTIFICIAL TEARS OP) Place 1 drop into both eyes 4 (four) times daily as needed (dry eyes).    . Melatonin 10 MG TABS Take 10 mg by mouth at bedtime.    . rosuvastatin (CRESTOR) 10 MG tablet Take 10 mg by mouth daily.    . sacubitril-valsartan (ENTRESTO) 49-51 MG Take 1 tablet by mouth 2 (two) times daily. 60 tablet 11  . spironolactone (ALDACTONE) 25 MG tablet Take 1 tablet (25 mg total) by mouth daily. 30 tablet 5  . vitamin C (ASCORBIC ACID) 500 MG tablet Take 500 mg by mouth at bedtime.     . Wheat Dextrin (BENEFIBER PO) Take 2 Scoops by mouth daily.     Marland Kitchen  nitroGLYCERIN (NITROSTAT) 0.4 MG SL tablet Place 1 tablet (0.4 mg total) under the tongue every 5 (five) minutes as needed for chest pain. (Patient not taking: Reported on 03/11/2018) 25 tablet 3   No current facility-administered medications for this encounter.     Allergies  Allergen Reactions  . Lipitor [Atorvastatin] Other (See Comments)    myalgias    Social History   Socioeconomic History  . Marital status: Married    Spouse name: Not on file  . Number of children: 2  . Years of education: Not on file  . Highest education level: Not on file  Occupational History  . 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 Financial resource strain: Not on file  . Food insecurity:    Worry: Not on file    Inability: Not on file  . Transportation needs:    Medical: Not on file    Non-medical: Not on file  Tobacco Use  . Smoking status: Former Smoker    Packs/day: 1.50    Years: 60.00    Pack years: 90.00    Types: Cigarettes    Last attempt to quit: 07/25/2008    Years since quitting: 9.6  . Smokeless tobacco: Never Used  Substance and Sexual Activity  . Alcohol use: Yes    Alcohol/week: 0.0 standard drinks    Comment: Occ    . Drug use: No  . Sexual activity: Not on file  Lifestyle  . Physical activity:    Days per week: Not on file    Minutes per session: Not on file  . Stress: Not on file  Relationships  . Social connections:    Talks on phone: Not on file    Gets together: Not on file    Attends religious service: Not on file    Active member of club or organization: Not on file    Attends meetings of clubs or organizations: Not on file    Relationship status: Not on file  . Intimate partner violence:    Fear of current or ex partner: Not on file    Emotionally abused: Not on file    Physically abused: Not on file    Forced sexual activity: Not on file  Other Topics Concern  . Not on file  Social History Narrative  . Not on file    Family History  Problem Relation Age of Onset  . Heart disease Father   . Hypertension Mother   . Asthma Paternal Grandmother   . Colon cancer Neg Hx   . Esophageal cancer Neg Hx   . Stomach cancer Neg Hx   . Rectal cancer Neg Hx   . Liver cancer Neg Hx     ROS- All systems are reviewed and negative except as per the HPI above  Physical Exam: Vitals:   03/11/18 1139  BP: 134/68  Pulse: 83  Weight: 79.4 kg  Height: 5\' 9"  (1.753 m)   Wt Readings from Last 3 Encounters:  03/11/18 79.4 kg  03/10/18 80.1 kg  02/06/18 81.9 kg    Labs: Lab Results  Component Value Date   NA 141 02/07/2018   K 4.4 02/07/2018   CL 106 02/07/2018   CO2 28 02/07/2018   GLUCOSE 174 (H) 02/07/2018   BUN 22 02/07/2018   CREATININE 1.22 02/07/2018   CALCIUM 9.4 02/07/2018   MG 2.1 11/20/2017   Lab Results  Component Value Date   INR 1.1 12/18/2017   Lab Results  Component  Value Date   CHOL 121 10/02/2017   HDL 46 10/02/2017   LDLCALC 50 10/02/2017   TRIG 127 10/02/2017     GEN- The patient is well appearing, alert and oriented x 3 today.   Head- normocephalic, atraumatic Eyes-  Sclera clear, conjunctiva pink Ears- hearing intact Oropharynx- clear Neck-  supple, no JVP Lymph- no cervical lymphadenopathy Lungs- Clear to ausculation bilaterally, normal work of breathing Heart- irregular rate and rhythm, no murmurs, rubs or gallops, PMI not laterally displaced GI- soft, NT, ND, + BS Extremities- no clubbing, cyanosis, or edema MS- no significant deformity or atrophy Skin- no rash or lesion Psych- euthymic mood, full affect Neuro- strength and sensation are intact  EKG-afib at 83 bpm, qrs int 86 ms, qtc 440 ms, PVC's noted with couplets Echo-Study Conclusions  - Left ventricle: The cavity size was severely dilated. Systolic   function was moderately to severely reduced. The estimated   ejection fraction was in the range of 30% to 35%. There is   hypokinesis of the lateral myocardium. There is akinesis of the   inferolateral and apical myocardium. The study was not   technically sufficient to allow evaluation of LV diastolic   dysfunction due to atrial fibrillation. - Aorta: Maximal diameter of abdominal aorta: 41 mm. - Abdominal aorta: The abdominal aorta was moderately dilated. - Tricuspid valve: There was trivial regurgitation. - Pulmonic valve: There was trivial regurgitation.    Assessment and Plan: 1. New onset afib May 2019 He is rate controlled He is feeling better but with reduced EF, Dr. [name] recommended Tikosyn use Tikosyn risk/ benefit discussed and he wants to proceed Successful cardioversion in the spring but with ERAF Cardiac cath 8/1, receiving LAD stent with EF by cath 25% No missed doses of eliquis  PharmD reviewed drugs qtc 434 ms Bmet/mag, K+ and mag at acceptable levels for admission with crcl cal at 52.81  2. LV dysfunction No unusual shortness of breath Currently weight is stable Continue BB/ARB/spironolactone  Per  Dr. [name]   3. CAD Stable Pt is c/o of H/A since starting Imdur 30 mg daily in July I discussed with Doroteo Bradford, PharmD and she recommended cutting Imdur in half to 15 mg daily  To be  admitted later today when bed is available   Butch Penny C. Averi Cacioppo, [facility] [address] [place], [place] [address] [phone]

## 2018-03-11 NOTE — Progress Notes (Signed)
Pharmacy Review for Dofetilide (Tikosyn) Initiation  Admit Complaint: 84 y.o. male admitted 03/11/2018 with atrial fibrillation to be initiated on dofetilide.   Assessment:  Patient Exclusion Criteria: If any screening criteria checked as "Yes", then  patient  should NOT receive dofetilide until criteria item is corrected. If "Yes" please indicate correction plan.  YES  NO Patient  Exclusion Criteria Correction Plan  []  [x]  Baseline QTc interval is greater than or equal to 440 msec. IF above YES box checked dofetilide contraindicated unless patient has ICD; then may proceed if QTc 500-550 msec or with known ventricular conduction abnormalities may proceed with QTc 550-600 msec. QTc = QTc 434    []  [x]  Magnesium level is less than 1.8 mEq/l : Last magnesium:  Lab Results  Component Value Date   MG 2.1 03/11/2018         []  [x]  Potassium level is less than 4 mEq/l : Last potassium:  Lab Results  Component Value Date   K 4.2 03/11/2018         []  [x]  Patient is known or suspected to have a digoxin level greater than 2 ng/ml: No results found for: DIGOXIN    []  [x]  Creatinine clearance less than 20 ml/min (calculated using Cockcroft-Gault, actual body weight and serum creatinine): Estimated Creatinine Clearance: 47 mL/min (by C-G formula based on SCr of 1.19 mg/dL).    []  [x]  Patient has received drugs known to prolong the QT intervals within the last 48 hours (phenothiazines, tricyclics or tetracyclic antidepressants, erythromycin, H-1 antihistamines, cisapride, fluoroquinolones, azithromycin). Drugs not listed above may have an, as yet, undetected potential to prolong the QT interval, updated information on QT prolonging agents is available at this website:QT prolonging agents   []  [x]  Patient received a dose of hydrochlorothiazide (Oretic) alone or in any combination including triamterene (Dyazide, Maxzide) in the last 48 hours.   []  [x]  Patient received a medication known to  increase dofetilide plasma concentrations prior to initial dofetilide dose:  . Trimethoprim (Primsol, Proloprim) in the last 36 hours . Verapamil (Calan, Verelan) in the last 36 hours or a sustained release dose in the last 72 hours . Megestrol (Megace) in the last 5 days  . Cimetidine (Tagamet) in the last 6 hours . Ketoconazole (Nizoral) in the last 24 hours . Itraconazole (Sporanox) in the last 48 hours  . Prochlorperazine (Compazine) in the last 36 hours    []  [x]  Patient is known to have a history of torsades de pointes; congenital or acquired long QT syndromes.   []  [x]  Patient has received a Class 1 antiarrhythmic with less than 2 half-lives since last dose. (Disopyramide, Quinidine, Procainamide, Lidocaine, Mexiletine, Flecainide, Propafenone)   []  [x]  Patient has received amiodarone therapy in the past 3 months or amiodarone level is greater than 0.3 ng/ml.    Patient has been appropriately anticoagulated with apixaban.  Ordering provider was confirmed at [email or web address] if they are not listed on the [facility] Prescribers list.  Goal of Therapy: Follow renal function, electrolytes, potential drug interactions, and dose adjustment. Provide education and 1 week supply at discharge.  Plan:  [x]   Physician selected initial dose within range recommended for patients level of renal function - will monitor for response.  []   Physician selected initial dose outside of range recommended for patients level of renal function - will discuss if the dose should be altered at this time.   Select One Calculated CrCl  Dose q12h  []  > 60 ml/min 500 mcg  [  x] 40-60 ml/min 250 mcg  []  20-40 ml/min 125 mcg   2. Follow up QTc after the first 5 doses, renal function, electrolytes (K & Mg) daily x 3     days, dose adjustment, success of initiation and facilitate 1 week discharge supply as     clinically indicated.  3. Initiate Tikosyn education video (Call [phone] and ask for Tikosyn Video  # [phone]).  4. Place Enrollment Form on the chart for discharge supply of dofetilide.   Doylene Canard, PharmD Clinical Pharmacist  Pager: [phone] Phone: [phone]  6:04 PM 03/11/2018

## 2018-03-11 NOTE — H&P (Signed)
Primary Care Physician: [name], MD Referring Physician:Dr. [name] EP; Dr. Soundra Pilon Dillon Young is a 84 y.o. male with a h/o coronary artery disease status post angioplasty in 1994 and 1995of his circumflex coronary artery. . He has a history of hypertension and hyperlipidemia on medication. He had right carotid endarterectomy performed by Dr. Curt Jews June 2013.Myoview stress test performed 01/09/16 was notable for ischemia in the RCA distribution. Based on this he underwent cardiac catheterization by Dr. Varanasi8/21/17 revealing a 90% segmental mid dominant RCA stenosis which was stented with a Synergy 3.5 mm x 20 mm long drug-eluting stent. He did have a very small ramus branch that was 90% stenosed and not suitable for intervention as well as a 70% mid LAD lesion although he had no evidence of anterior ischemia on his stress test. His symptoms of chest pain have resolved.  He was diagnosed in May 2019 with new  onset  afib from which the pt is asymptomatic. Echo was done and showed EF decreased to 30-35%, normal EF by echo in August 2017. He has been on Eliquis 5 mg bid since early May without missed doses.  Plavix were stopped at that time, ASA continued and pt was started on carvedilol 6.25 mg bid.He states that he had a sleep study in the past but never slept enough with the study to get good results. He is afraid that he would never be able to tolerate a cpap. Sleeps in a recliner now, " I just feel more comfortable".  F/u in afib clinic 10/03/17, unfortunately cardioversion only held for 1-2 days.He developed  exertional chest discomfort   and was seen by R. Barrett, PA and had a cardiac cath which pt did receive a stent in the LAD. He feels improved but still remains in afib.  He did have to hold anticoagulation for 1-2 days and was back on anticoagulation without interruption on 8/23.  We did discuss trying to restore SR with EF moderately reduced and he would like to purse  after his vacation at the end of the month.  F/u in afib clinic, 01/31/18. He is back in afib clinic to discuss Tikosyn admit.  He has recently seen Dr. [name] and he also encouraged pt to consider Tiksoyn for his LV dysfunction.Marland Kitchen He is feeling better and was hoping he could get by without drug. He will commit but wants to wait until 10/1. He also reports that he has been having h/a's ever since he started Imdur. I discussed with Doroteo Bradford, PharmD, and  Imdur was eventually stopped with resolution of H/A.  Dillon Young is now back in afib clinic for tikosyn admit.  No misses eliquis doses and no benadryl use.PharmD has screened drugs and no qt prolonging drugs. He will be applying for drug assistance and papers received and reviewed.  Today, he denies symptoms of palpitations, shortness of breath, orthopnea, PND, lower extremity edema, dizziness, presyncope, syncope, or neurologic sequela.  The patient is tolerating medications without difficulties and is otherwise without complaint today.   Past Medical History:  Diagnosis Date  . Adenomatous colon polyp   . Allergic rhinitis   . BPH (benign prostatic hyperplasia)   . CAD (coronary artery disease)    sees Dr. [name]   . Carotid artery disease ([place])   . CHF (congestive heart failure) ([place])   . Chronic kidney disease    [place]. Prostate cancer  . Clotting disorder ([place])   . CVA (cerebral infarction)   .  Diabetes mellitus    "Borderline"  . Diverticulitis   . GERD (gastroesophageal reflux disease)    history  . Hemorrhoids   . HTN (hypertension)   . Hyperlipidemia LDL goal <70   . IBS (irritable bowel syndrome)   . Low back pain   . Prostate cancer ([place]) 2008   seed implantation  . Sleep apnea    last study was aborted.  Has never worn a CPAP.    Marland Kitchen Smoker    Past Surgical History:  Procedure Laterality Date  . APPENDECTOMY    . CARDIAC CATHETERIZATION N/A 01/16/2016   Procedure: Left Heart Cath and Coronary Angiography;  Surgeon:  Jettie Booze, MD;  Location: [facility];  Service: Cardiovascular;  Laterality: N/A;  . CARDIAC CATHETERIZATION N/A 01/16/2016   Procedure: Coronary Stent Intervention;  Surgeon: Jettie Booze, MD;  Location: [facility];  Service: Cardiovascular;  Laterality: N/A;  . CARDIOVERSION N/A 11/26/2017   Procedure: CARDIOVERSION;  Surgeon: [name], MD;  Location: [facility] ENDOSCOPY;  Service: Cardiovascular;  Laterality: N/A;  . CATARACT EXTRACTION W/ INTRAOCULAR LENS  IMPLANT, BILATERAL Bilateral   . COLONOSCOPY  10/21/2008   diverticulosis, radiation proctitis, internal hemorrhoids  . CORONARY STENT INTERVENTION N/A 12/26/2017   Procedure: CORONARY STENT INTERVENTION;  Surgeon: [name], MD;  Location: [place] [facility];  Service: Cardiovascular;  Laterality: N/A;  . ENDARTERECTOMY  11/12/2011   Procedure: ENDARTERECTOMY CAROTID;  Surgeon: Rosetta Posner, MD;  Location: [facility] OR;  Service: Vascular;  Laterality: Right;  . ESOPHAGOGASTRODUODENOSCOPY (EGD) WITH ESOPHAGEAL DILATION    . HEMORRHOID BANDING    . LEFT HEART CATH AND CORONARY ANGIOGRAPHY N/A 12/26/2017   Procedure: LEFT HEART CATH AND CORONARY ANGIOGRAPHY;  Surgeon: [name], MD;  Location: [facility];  Service: Cardiovascular;  Laterality: N/A;  . LITHOTRIPSY  X 3  . LUMBAR LAMINECTOMY    . TRANSURETHRAL RESECTION OF PROSTATE  10/2003    No current facility-administered medications for this encounter.     Allergies  Allergen Reactions  . Lipitor [Atorvastatin] Other (See Comments)    myalgias    Social History   Socioeconomic History  . Marital status: Married    Spouse name: Not on file  . Number of children: 2  . Years of education: Not on file  . Highest education level: Not on file  Occupational History  . 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 Financial resource strain: Not on file  . Food insecurity:    Worry: Not on file    Inability: Not on file  .  Transportation needs:    Medical: Not on file    Non-medical: Not on file  Tobacco Use  . Smoking status: Former Smoker    Packs/day: 1.50    Years: 60.00    Pack years: 90.00    Types: Cigarettes    Last attempt to quit: 07/25/2008    Years since quitting: 9.6  . Smokeless tobacco: Never Used  Substance and Sexual Activity  . Alcohol use: Yes    Alcohol/week: 0.0 standard drinks    Comment: Occ  . Drug use: No  . Sexual activity: Not on file  Lifestyle  . Physical activity:    Days per week: Not on file    Minutes per session: Not on file  . Stress: Not on file  Relationships  . Social connections:    Talks on phone: Not on file  Gets together: Not on file    Attends religious service: Not on file    Active member of club or organization: Not on file    Attends meetings of clubs or organizations: Not on file    Relationship status: Not on file  . Intimate partner violence:    Fear of current or ex partner: Not on file    Emotionally abused: Not on file    Physically abused: Not on file    Forced sexual activity: Not on file  Other Topics Concern  . Not on file  Social History Narrative  . Not on file    Family History  Problem Relation Age of Onset  . Heart disease Father   . Hypertension Mother   . Asthma Paternal Grandmother   . Colon cancer Neg Hx   . Esophageal cancer Neg Hx   . Stomach cancer Neg Hx   . Rectal cancer Neg Hx   . Liver cancer Neg Hx     ROS- All systems are reviewed and negative except as per the HPI above  Physical Exam: There were no vitals filed for this visit. Wt Readings from Last 3 Encounters:  03/11/18 79.4 kg  03/10/18 80.1 kg  02/06/18 81.9 kg    Labs: Lab Results  Component Value Date   NA 140 03/11/2018   K 4.2 03/11/2018   CL 104 03/11/2018   CO2 29 03/11/2018   GLUCOSE 100 (H) 03/11/2018   BUN 19 03/11/2018   CREATININE 1.19 03/11/2018   CALCIUM 9.4 03/11/2018   MG 2.1 03/11/2018   Lab Results  Component  Value Date   INR 1.1 12/18/2017   Lab Results  Component Value Date   CHOL 121 10/02/2017   HDL 46 10/02/2017   LDLCALC 50 10/02/2017   TRIG 127 10/02/2017     GEN- The patient is well appearing, alert and oriented x 3 today.   Head- normocephalic, atraumatic Eyes-  Sclera clear, conjunctiva pink Ears- hearing intact Oropharynx- clear Neck- supple, no JVP Lymph- no cervical lymphadenopathy Lungs- Clear to ausculation bilaterally, normal work of breathing Heart- irregular rate and rhythm, no murmurs, rubs or gallops, PMI not laterally displaced GI- soft, NT, ND, + BS Extremities- no clubbing, cyanosis, or edema MS- no significant deformity or atrophy Skin- no rash or lesion Psych- euthymic mood, full affect Neuro- strength and sensation are intact  EKG-afib at 83 bpm, qrs int 86 ms, qtc 440 ms, PVC's noted with couplets Echo-Study Conclusions  - Left ventricle: The cavity size was severely dilated. Systolic   function was moderately to severely reduced. The estimated   ejection fraction was in the range of 30% to 35%. There is   hypokinesis of the lateral myocardium. There is akinesis of the   inferolateral and apical myocardium. The study was not   technically sufficient to allow evaluation of LV diastolic   dysfunction due to atrial fibrillation. - Aorta: Maximal diameter of abdominal aorta: 41 mm. - Abdominal aorta: The abdominal aorta was moderately dilated. - Tricuspid valve: There was trivial regurgitation. - Pulmonic valve: There was trivial regurgitation.    Assessment and Plan: 1. New onset afib May 2019 He is rate controlled He is feeling better but with reduced EF, Dr. [name] recommended Tikosyn use Tikosyn risk/ benefit discussed and he wants to proceed Successful cardioversion in the spring but with ERAF Cardiac cath 8/1, receiving LAD stent with EF by cath 25% No missed doses of eliquis  PharmD reviewed drugs qtc  434 ms Bmet/mag, K+ and mag at  acceptable levels for admission with crcl cal at 52.81  2. LV dysfunction No unusual shortness of breath Currently weight is stable Continue BB/ARB/spironolactone  Per  Dr. [name]   3. CAD Stable Pt is c/o of H/A since starting Imdur 30 mg daily in July I discussed with Doroteo Bradford, PharmD and she recommended cutting Imdur in half to 15 mg daily  To be admitted later today when bed is available   Butch Penny C. Carroll, [facility] [address] [place], [place] [address] [phone]   I have seen, examined the patient, and reviewed the above assessment and plan.  Changes to above are made where necessary.  On exam, iRRR.  Pt with known CAD, reduced EF, and persistent afib.  He is admitted for initiation of tikosyn at this time.  He reports compliance with eliquis without interruption.  ekg and labs are reviewed.  Ok to start [facility].  Co Sign: [name], MD 03/11/2018 5:29 PM

## 2018-03-12 ENCOUNTER — Other Ambulatory Visit: Payer: Self-pay

## 2018-03-12 ENCOUNTER — Encounter (HOSPITAL_COMMUNITY): Payer: Self-pay

## 2018-03-12 LAB — BASIC METABOLIC PANEL
Anion gap: 7 (ref 5–15)
BUN: 16 mg/dL (ref 8–23)
CHLORIDE: 107 mmol/L (ref 98–111)
CO2: 28 mmol/L (ref 22–32)
CREATININE: 1.05 mg/dL (ref 0.61–1.24)
Calcium: 9.3 mg/dL (ref 8.9–10.3)
GFR calc non Af Amer: >60 mL/min (ref >60)
Glucose, Bld: 121 mg/dL — ABNORMAL HIGH (ref 70–99)
POTASSIUM: 4 mmol/L (ref 3.5–5.1)
SODIUM: 142 mmol/L (ref 135–145)

## 2018-03-12 LAB — MAGNESIUM: Magnesium: 2 mg/dL (ref 1.7–2.4)

## 2018-03-12 NOTE — Discharge Instructions (Addendum)
You have an appointment set up with the Atrial Fibrillation Clinic.  Multiple studies have shown that being followed by a dedicated atrial fibrillation clinic in addition to the standard care you receive from your other physicians improves health. We believe that enrollment in the atrial fibrillation clinic will allow us to better care for you.  ° °The phone number to the Atrial Fibrillation Clinic is [phone]. The clinic is staffed Monday through Friday from 8:30am to 5pm. ° °Parking Directions: The clinic is located in the Heart and Vascular Building connected to [facility]. °1)From [address] turn on to [address] and go to the 3rd entrance  (Heart and Vascular entrance) on the right. °2)Look to the right for Heart &Vascular Parking Garage. °3)A code for the entrance is required please call the clinic to receive this.   °4)Take the elevators to the 1st floor. Registration is in the room with the glass walls at the end of the hallway. ° °If you have any trouble parking or locating the clinic, please don’t hesitate to call [phone]. ° ° °Information on my medicine - ELIQUIS® (apixaban) ° °This medication education was reviewed with me or my healthcare representative as part of my discharge preparation.  ° °Why was Eliquis® prescribed for you? °Eliquis® was prescribed for you to reduce the risk of forming blood clots that can cause a stroke if you have a medical condition called atrial fibrillation (a type of irregular heartbeat) OR to reduce the risk of a blood clots forming after orthopedic surgery. ° °What do You need to know about Eliquis® ? °Take your Eliquis® TWICE DAILY - one tablet in the morning and one tablet in the evening with or without food.  It would be best to take the doses about the same time each day. ° °If you have difficulty swallowing the tablet whole please discuss with your pharmacist how to take the medication safely. ° °Take Eliquis® exactly as prescribed by your  doctor and DO NOT stop taking Eliquis® without talking to the doctor who prescribed the medication.  Stopping may increase your risk of developing a new clot or stroke.  Refill your prescription before you run out. ° °After discharge, you should have regular check-up appointments with your healthcare provider that is prescribing your Eliquis®.  In the future your dose may need to be changed if your kidney function or weight changes by a significant amount or as you get older. ° °What do you do if you miss a dose? °If you miss a dose, take it as soon as you remember on the same day and resume taking twice daily.  Do not take more than one dose of ELIQUIS at the same time. ° °Important Safety Information °A possible side effect of Eliquis® is bleeding. You should call your healthcare provider right away if you experience any of the following: °? Bleeding from an injury or your nose that does not stop. °? Unusual colored urine (red or dark brown) or unusual colored stools (red or black). °? Unusual bruising for unknown reasons. °? A serious fall or if you hit your head (even if there is no bleeding). ° °Some medicines may interact with Eliquis® and might increase your risk of bleeding or clotting while on Eliquis®. To help avoid this, consult your healthcare provider or pharmacist prior to using any new prescription or non-prescription medications, including herbals, vitamins, non-steroidal anti-inflammatory drugs (NSAIDs) and supplements. ° °This website has more information on Eliquis® (apixaban): www.Eliquis.com. ° °  °

## 2018-03-12 NOTE — Progress Notes (Addendum)
Electrophysiology Rounding Note  Patient Name: Dillon Young Date of Encounter: 03/12/2018  Primary Cardiologist: [name] Heart Failure: [name] Electrophysiologist: Sabrena Gavitt   Subjective   The patient is doing well today.  At this time, the patient denies chest pain, shortness of breath, or any new concerns.  Inpatient Medications    Scheduled Meds: . apixaban  5 mg Oral BID  . carvedilol  6.25 mg Oral BID  . cholecalciferol  1,000 Units Oral QHS  . clopidogrel  75 mg Oral QPM  . dofetilide  250 mcg Oral BID  . Melatonin  9 mg Oral QHS  . rosuvastatin  10 mg Oral Daily  . sacubitril-valsartan  1 tablet Oral BID  . sodium chloride flush  3 mL Intravenous Q12H  . spironolactone  25 mg Oral Daily  . vitamin C  500 mg Oral QHS   Continuous Infusions: . sodium chloride     PRN Meds: sodium chloride, fluticasone, hydrocortisone, polyvinyl alcohol, sodium chloride flush   Vital Signs    Vitals:   03/11/18 1730 03/11/18 2047 03/12/18 0414  BP: (!) 134/57 (!) 159/76 139/71  Pulse: 83 69 84  Resp: 15 18 13   Temp: 97.6 F (36.4 C) 97.8 F (36.6 C) 97.6 F (36.4 C)  TempSrc: Oral Oral Oral  SpO2: 97% 95% 97%  Weight: 79.7 kg  78.5 kg  Height: 5\' 9"  (1.753 m)      Intake/Output Summary (Last 24 hours) at 03/12/2018 0940 Last data filed at 03/12/2018 0804 Gross per 24 hour  Intake 123 ml  Output 300 ml  Net -177 ml   Filed Weights   03/11/18 1730 03/12/18 0414  Weight: 79.7 kg 78.5 kg    Physical Exam    GEN- The patient is well appearing, alert and oriented x 3 today.   Head- normocephalic, atraumatic Eyes-  Sclera clear, conjunctiva pink Ears- hearing intact Oropharynx- clear Neck- supple Lungs- Clear to ausculation bilaterally, normal work of breathing Heart- Irregular rate and rhythm  GI- soft, NT, ND, + BS Extremities- no clubbing, cyanosis, or edema Skin- no rash or lesion Psych- euthymic mood, full affect Neuro- strength and sensation are  intact  Labs    Basic Metabolic Panel Recent Labs    03/11/18 1159 03/12/18 0347  NA 140 142  K 4.2 4.0  CL 104 107  CO2 29 28  GLUCOSE 100* 121*  BUN 19 16  CREATININE 1.19 1.05  CALCIUM 9.4 9.3  MG 2.1 2.0    Telemetry    AF, PVC's (personally reviewed)  Radiology    No results found.   Patient Profile     Dillon Young is a 84 y.o. male admitted for Tikosyn load  Assessment & Plan    1.  Persistent atrial fibrillation Admitted for Tikosyn QTc, BMET, Mg stable Continue Eliquis for CHADS2VASC of 8 Plan DCCV tomorrow if still in AF NPO after midnight tonight  2.  Chronic systolic heart failure Euvolemic on exam Continue current therapy Reassess EF after 3 months in SR  3.  CAD No recent ischemic symptoms Continue current therapy  For questions or updates, please contact [facility] Please consult [email or web address] for contact info under Cardiology/STEMI.  Signed, Chanetta Marshall, NP  03/12/2018, 9:40 AM    I have seen, examined the patient, and reviewed the above assessment and plan.  Changes to above are made where necessary.  On exam, iRRR.  Remains in AF.  QT is stable. Will plan cardioversion tomorrow if not  in sinus rhythm then.  Co Sign: [name], MD 03/12/2018 9:30 PM

## 2018-03-12 NOTE — Care Management (Signed)
#   5.  [facility] RX # [phone]  1. TIKOSYN 125 MCG   250 MCG   500 MCG BID [place]  # [phone]   2. DOFETILIDE   125  MCG BID COVER- YES CO-PAY- 37 % OF TOTAL COST  TIER 4 DRUG PRIOR APPROVAL- NO  3.  DOFETILIDE  250 MCG   BID COVER- YES CO-PAY- 37 % OF TOTAL COST TIER- 4 DRUG PRIOR APPROVAL- NO  4. DOFETILIDE  500 MCG BID COVER- YES CO-PAY- 37 % OF TOTAL COST TIER-4 DRUG PRIOR APPROVAL- NO  NO DEDUCTIBLE  PREFERRED PHARMACY : YES  [facility]

## 2018-03-12 NOTE — Progress Notes (Signed)
Pt had 3 beats run of VT.  Asymptomatic.  Idolina Primer, RN

## 2018-03-13 ENCOUNTER — Inpatient Hospital Stay (HOSPITAL_COMMUNITY): Payer: PPO | Admitting: Certified Registered"

## 2018-03-13 ENCOUNTER — Encounter (HOSPITAL_COMMUNITY): Payer: Self-pay | Admitting: Internal Medicine

## 2018-03-13 ENCOUNTER — Encounter (HOSPITAL_COMMUNITY)
Admission: RE | Disposition: A | Discharge status: Home / Self Care | Payer: Self-pay | Source: Ambulatory Visit | Attending: Internal Medicine

## 2018-03-13 HISTORY — PX: CARDIOVERSION: SHX1299

## 2018-03-13 LAB — BASIC METABOLIC PANEL
ANION GAP: 7 (ref 5–15)
BUN: 16 mg/dL (ref 8–23)
CALCIUM: 9 mg/dL (ref 8.9–10.3)
CO2: 28 mmol/L (ref 22–32)
Chloride: 104 mmol/L (ref 98–111)
Creatinine, Ser: 1.01 mg/dL (ref 0.61–1.24)
GFR calc Af Amer: >60 mL/min (ref >60)
GFR calc non Af Amer: >60 mL/min (ref >60)
GLUCOSE: 110 mg/dL — AB (ref 70–99)
POTASSIUM: 4.1 mmol/L (ref 3.5–5.1)
Sodium: 139 mmol/L (ref 135–145)

## 2018-03-13 LAB — MAGNESIUM: Magnesium: 1.9 mg/dL (ref 1.7–2.4)

## 2018-03-13 SURGERY — CARDIOVERSION
Anesthesia: General

## 2018-03-13 MED ORDER — LIDOCAINE HCL (CARDIAC) PF 100 MG/5ML IV SOSY
PREFILLED_SYRINGE | INTRAVENOUS | Status: DC | PRN
  Administered 2018-03-13: 40 mg via INTRATRACHEAL

## 2018-03-13 MED ORDER — PROPOFOL 500 MG/50ML IV EMUL: INTRAVENOUS | Status: DC | PRN

## 2018-03-13 MED ORDER — PROPOFOL 10 MG/ML IV BOLUS
INTRAVENOUS | Status: DC | PRN
  Administered 2018-03-13: 60 mg via INTRAVENOUS

## 2018-03-13 MED ORDER — PROPOFOL 10 MG/ML IV BOLUS: INTRAVENOUS | Status: DC | PRN

## 2018-03-13 MED ORDER — SODIUM CHLORIDE 0.9 % IV SOLN
Freq: Once | INTRAVENOUS | Status: AC
  Administered 2018-03-13: 11:00:00 via INTRAVENOUS

## 2018-03-13 MED ORDER — PHENYLEPHRINE HCL 10 MG/ML IJ SOLN
INTRAMUSCULAR | Status: DC | PRN
  Administered 2018-03-13: 80 ug via INTRAVENOUS

## 2018-03-13 NOTE — Anesthesia Procedure Notes (Signed)
Procedure Name: MAC Date/Time: 03/13/2018 12:03 PM Performed by: Lavell Luster, CRNA Patient Re-evaluated:Patient Re-evaluated prior to induction Oxygen Delivery Method: Nasal cannula Induction Type: IV induction Ventilation: Mask ventilation without difficulty Placement Confirmation: breath sounds checked- equal and bilateral and positive ETCO2 Dental Injury: Teeth and Oropharynx as per pre-operative assessment

## 2018-03-13 NOTE — Care Management Note (Addendum)
Case Management Note  Patient Details  Name: Dillon Young MRN: [id number] Date of Birth: 1932/10/02  Subjective/Objective:    Pt presented for Tikosyn. Persistent Atrial Fib. Pt is agreeable for a 1 week supply of Tikosyn via [facility] to deliver the medication to the bedside. MD please escribe Rx for 7 day supply to [facility] and the original Rx with refills to [facility].                                   Action/Plan: Per patient the [facility] will submit the patient assistance application. No further needs from CM at this time.   Expected Discharge Date:                  Expected Discharge Plan:  Home/Self Care  In-House Referral:  NA  Discharge planning Services  CM Consult, Medication Assistance  Post Acute Care Choice:  NA Choice offered to:  NA  DME Arranged:  N/A DME Agency:  NA  HH Arranged:  NA HH Agency:  NA  Status of Service:  Completed, signed off  If discussed at Major of Stay Meetings, dates discussed:    Additional Comments: 1153 03-14-18 Jacqlyn Krauss, RN,BSN [phone] [facility] MD please write Rx for 7 day supply and it will be sent to [facility] and pt will need an additional Rx with refills for Tikosyn.  Bethena Roys, RN 03/13/2018, 3:07 PM

## 2018-03-13 NOTE — Anesthesia Preprocedure Evaluation (Addendum)
Anesthesia Evaluation  Patient identified by MRN, date of birth, ID band Patient awake    Reviewed: Allergy & Precautions, NPO status , Patient's Chart, lab work & pertinent test results  Airway Mallampati: II  TM Distance: >3 FB Neck ROM: Full    Dental no notable dental hx.    Pulmonary sleep apnea , former smoker,    Pulmonary exam normal breath sounds clear to auscultation       Cardiovascular hypertension, Pt. on home beta blockers + CAD and +CHF   Rhythm:Irregular Rate:Normal  ECG: A-fib, LAD, rate 67   Neuro/Psych CVA negative psych ROS   GI/Hepatic Neg liver ROS, GERD  ,Low back pain   Endo/Other  diabetes  Renal/GU negative Renal ROS     Musculoskeletal Low back pain   Abdominal   Peds  Hematology HLD   Anesthesia Other Findings a fib tikosyn admit  Reproductive/Obstetrics                            Anesthesia Physical Anesthesia Plan  ASA: III  Anesthesia Plan: General   Post-op Pain Management:    Induction: Intravenous  PONV Risk Score and Plan: 2 and Propofol infusion and Treatment may vary due to age or medical condition  Airway Management Planned: Mask  Additional Equipment:   Intra-op Plan:   Post-operative Plan:   Informed Consent: I have reviewed the patients History and Physical, chart, labs and discussed the procedure including the risks, benefits and alternatives for the proposed anesthesia with the patient or authorized representative who has indicated his/her understanding and acceptance.   Dental advisory given  Plan Discussed with: CRNA  Anesthesia Plan Comments:         Anesthesia Quick Evaluation

## 2018-03-13 NOTE — Progress Notes (Addendum)
Electrophysiology Rounding Note  Patient Name: Dillon Young Date of Encounter: 03/13/2018  Primary Cardiologist: [name] Heart Failure: [name] Electrophysiologist: Azazel Franze   Subjective   The patient is doing well today.  At this time, the patient denies chest pain, shortness of breath, or any new concerns.  Inpatient Medications    Scheduled Meds: . apixaban  5 mg Oral BID  . carvedilol  6.25 mg Oral BID  . cholecalciferol  1,000 Units Oral QHS  . clopidogrel  75 mg Oral QPM  . dofetilide  250 mcg Oral BID  . Melatonin  9 mg Oral QHS  . rosuvastatin  10 mg Oral Daily  . sacubitril-valsartan  1 tablet Oral BID  . sodium chloride flush  3 mL Intravenous Q12H  . spironolactone  25 mg Oral Daily  . vitamin C  500 mg Oral QHS   Continuous Infusions: . sodium chloride     PRN Meds: sodium chloride, fluticasone, hydrocortisone, polyvinyl alcohol, sodium chloride flush   Vital Signs    Vitals:   03/12/18 1345 03/12/18 2142 03/13/18 0608 03/13/18 0800  BP: 128/87 (!) 129/56 (!) 127/92 127/62  Pulse: 82 (!) 54 (!) 46 82  Resp: 12   20  Temp: (!) 97.5 F (36.4 C) 98.2 F (36.8 C) (!) 97.5 F (36.4 C) 97.6 F (36.4 C)  TempSrc: Oral Oral Axillary Oral  SpO2: 98% 97% 96% 93%  Weight:   77.2 kg   Height:        Intake/Output Summary (Last 24 hours) at 03/13/2018 0836 Last data filed at 03/13/2018 0610 Gross per 24 hour  Intake 720 ml  Output 750 ml  Net -30 ml   Filed Weights   03/11/18 1730 03/12/18 0414 03/13/18 0608  Weight: 79.7 kg 78.5 kg 77.2 kg    Physical Exam    GEN- The patient is elderly appearing, alert and oriented x 3 today.   Head- normocephalic, atraumatic Eyes-  Sclera clear, conjunctiva pink Ears- hearing intact Oropharynx- clear Neck- supple Lungs- Clear to ausculation bilaterally, normal work of breathing Heart- Irregular rate and rhythm  GI- soft, NT, ND, + BS Extremities- no clubbing, cyanosis, or edema Skin- no rash or  lesion Psych- euthymic mood, full affect Neuro- strength and sensation are intact  Labs    Basic Metabolic Panel Recent Labs    03/12/18 0347 03/13/18 0601  NA 142 139  K 4.0 4.1  CL 107 104  CO2 28 28  GLUCOSE 121* 110*  BUN 16 16  CREATININE 1.05 1.01  CALCIUM 9.3 9.0  MG 2.0 1.9     Telemetry    AF, PVC's (personally reviewed)  Radiology    No results found.   Patient Profile     Dillon Young is a 84 y.o. male admitted for Tikosyn load  Assessment & Plan    1.  Persistent atrial fibrillation Admitted for Tikosyn BMET, Mg, QTc stable Plan DCCV today Continue Eliquis for CHADS2VASC of 8  2.  Chronic systolic heart failure Euvolemic on exam Continue current therapy Reassess EF after 3 months in SR  3.  CAD No recent ischemic symptoms Continue current therapy  For questions or updates, please contact [facility] Please consult [email or web address] for contact info under Cardiology/STEMI.  Signed, Chanetta Marshall, NP  03/13/2018, 8:36 AM    I have seen, examined the patient, and reviewed the above assessment and plan.  Changes to above are made where necessary.  On exam, iRRR.  Qt is stable.  Plan cardioversion today.  Co Sign: [name], MD 03/13/2018 10:10 AM

## 2018-03-13 NOTE — CV Procedure (Signed)
   CARDIOVERSION NOTE  Procedure: Electrical Cardioversion Indications:  Atrial Fibrillation  Procedure Details:  Consent: Risks of procedure as well as the alternatives and risks of each were explained to the (patient/caregiver).  Consent for procedure obtained.  Time Out: Verified patient identification, verified procedure, site/side was marked, verified correct patient position, special equipment/implants available, medications/allergies/relevent history reviewed, required imaging and test results available.  Performed  Patient placed on cardiac monitor, pulse oximetry, supplemental oxygen as necessary.  Sedation given: Propofol per anesthesia Pacer pads placed anterior and posterior chest.  Cardioverted 3 time(s).  Cardioverted at 150J and 200J x 2 biphasic.  Impression: Findings: Post procedure EKG shows: Sinus rhythm with PVC's Complications: None Patient did tolerate procedure well.  Plan: 1. Successful DCCV to NSR with PVC's and occasional ventricular bigeminy.  Time Spent Directly with the Patient:  45 minutes   [name], MD, [facility], [place] Director of the Advanced Lipid Disorders &  Cardiovascular Risk Reduction Clinic Diplomate of the American Board of Clinical Lipidology Attending Cardiologist  Direct Dial: [phone]  Fax: [phone]  Website:  [email or web address] Osgood Skyleigh Windle 03/13/2018, 12:02 PM

## 2018-03-13 NOTE — Progress Notes (Signed)
Patient heart rate primarily in the low to mid 50's at this time.  Cardiology paged and updated.  Hold evening scheduled dose of Coreg per Dr. Charlaine Dalton (Cardiology on call).

## 2018-03-13 NOTE — Anesthesia Postprocedure Evaluation (Signed)
Anesthesia Post Note  Patient: Dillon Young  Procedure(s) Performed: CARDIOVERSION (N/A )     Patient location during evaluation: PACU Anesthesia Type: General Level of consciousness: awake and alert Pain management: pain level controlled Vital Signs Assessment: post-procedure vital signs reviewed and stable Respiratory status: spontaneous breathing, nonlabored ventilation, respiratory function stable and patient connected to nasal cannula oxygen Cardiovascular status: blood pressure returned to baseline and stable Postop Assessment: no apparent nausea or vomiting Anesthetic complications: no    Last Vitals:  Vitals:   03/13/18 1217 03/13/18 1223  BP: (!) 146/45 (!) 167/42  Pulse: (!) 27 (!) 33  Resp: 12 11  Temp:    SpO2: 98% 97%    Last Pain:  Vitals:   03/13/18 1223  TempSrc:   PainSc: 0-No pain                 Ryan P Ellender

## 2018-03-13 NOTE — Transfer of Care (Signed)
Immediate Anesthesia Transfer of Care Note  Patient: Dillon Young  Procedure(s) Performed: CARDIOVERSION (N/A )  Patient Location: PACU and Endoscopy Unit  Anesthesia Type:MAC  Level of Consciousness: awake, alert  and oriented  Airway & Oxygen Therapy: Patient connected to nasal cannula oxygen  Post-op Assessment: Post -op Vital signs reviewed and stable  Post vital signs: stable  Last Vitals:  Vitals Value Taken Time  BP    Temp    Pulse    Resp    SpO2      Last Pain:  Vitals:   03/13/18 1025  TempSrc: Oral  PainSc: 0-No pain      Patients Stated Pain Goal: 0 (29/47/65 4650)  Complications: No apparent anesthesia complications

## 2018-03-13 NOTE — H&P (Signed)
   INTERVAL PROCEDURE H&P  History and Physical Interval Note:  03/13/2018 11:26 AM  Dillon Young has presented today for their planned procedure. The various methods of treatment have been discussed with the patient and family. After consideration of risks, benefits and other options for treatment, the patient has consented to the procedure.  The patients' outpatient history has been reviewed, patient examined, and no change in status from most recent office note within the past 30 days. I have reviewed the patients' chart and labs and will proceed as planned. Questions were answered to the patient's satisfaction.   Dillon Casino, MD, [facility], [place] Director of the Advanced Lipid Disorders &  Cardiovascular Risk Reduction Clinic Diplomate of the American Board of Clinical Lipidology Attending Cardiologist  Direct Dial: [phone]  Fax: [phone]  Website:  [email or web address] Young 03/13/2018, 11:26 AM

## 2018-03-14 ENCOUNTER — Other Ambulatory Visit: Payer: Self-pay

## 2018-03-14 LAB — BASIC METABOLIC PANEL
ANION GAP: 9 (ref 5–15)
BUN: 22 mg/dL (ref 8–23)
CALCIUM: 9.5 mg/dL (ref 8.9–10.3)
CO2: 26 mmol/L (ref 22–32)
CREATININE: 1.26 mg/dL — AB (ref 0.61–1.24)
Chloride: 102 mmol/L (ref 98–111)
GFR calc Af Amer: 59 mL/min — ABNORMAL LOW (ref >60)
GFR, EST NON AFRICAN AMERICAN: 51 mL/min — AB (ref >60)
Glucose, Bld: 115 mg/dL — ABNORMAL HIGH (ref 70–99)
Potassium: 3.9 mmol/L (ref 3.5–5.1)
SODIUM: 137 mmol/L (ref 135–145)

## 2018-03-14 LAB — MAGNESIUM: MAGNESIUM: 2.1 mg/dL (ref 1.7–2.4)

## 2018-03-14 MED ORDER — POTASSIUM CHLORIDE CRYS ER 20 MEQ PO TBCR
20.0000 meq | EXTENDED_RELEASE_TABLET | Freq: Once | ORAL | Status: AC
  Administered 2018-03-14: 20 meq via ORAL
  Filled 2018-03-14: qty 1

## 2018-03-14 MED ORDER — DOFETILIDE 125 MCG PO CAPS
125.0000 ug | ORAL_CAPSULE | Freq: Two times a day (BID) | ORAL | Status: DC
  Administered 2018-03-14 – 2018-03-15 (×2): 125 ug via ORAL
  Filled 2018-03-14: qty 14
  Filled 2018-03-14 (×2): qty 1

## 2018-03-14 NOTE — Progress Notes (Signed)
Patient had two runs of nonsustained bigeminy throughout the night.  Patient asymptomatic with both.  Will continue to monitor.

## 2018-03-14 NOTE — Care Management Important Message (Signed)
Important Message  Patient Details  Name: Dillon Young MRN: [id number] Date of Birth: 29-Jul-1932   Medicare Important Message Given:  Yes    Orbie Pyo 03/14/2018, 2:29 PM

## 2018-03-14 NOTE — Discharge Summary (Addendum)
ELECTROPHYSIOLOGY PROCEDURE DISCHARGE SUMMARY    Patient ID: Dillon Young,  MRN: [id number], DOB/AGE: 06/12/1932 84 y.o.  Admit date: 03/11/2018 Discharge date: 03/15/2018  Primary Care Physician: [name], MD Primary Cardiologist: [name] Heart Failure: [name] Electrophysiologist: Maisley Hainsworth  Primary Discharge Diagnosis:  1.  Persistent atrial fibrillation status post Tikosyn loading this admission  Secondary Discharge Diagnosis:  1.  Chronic systolic heart failure 2.  CAD 3.  Prior CVA 4.  Diabetes 5.  IBS 6.  Hypertension 7.  Prostate cancer 8.  GERD  Allergies  Allergen Reactions  . Lipitor [Atorvastatin] Other (See Comments)    myalgias     Procedures This Admission:  1.  Tikosyn loading 2.  Direct current cardioversion on 03/13/18 by Dr Debara Pickett which successfully restored SR.  There were no early apparent complications.   Brief HPI: Dillon Young is a 84 y.o. male with a past medical history as noted above.  They were referred to EP in the outpatient setting for treatment options of atrial fibrillation.  Risks, benefits, and alternatives to Tikosyn were reviewed with the patient who wished to proceed.    Hospital Course:  The patient was admitted and Tikosyn was initiated.  Renal function and electrolytes were followed during the hospitalization. On 03/13/18 they underwent direct current cardioversion which restored sinus rhythm. Post cardioversion, QTc was long and his Tikosyn was held for 1 dose then decreased to 133mcg twice daily. They were monitored until discharge on telemetry which demonstrated SR with PVC's.  On the day of discharge, they were examined by Dr Rayann Heman who considered them stable for discharge to home.  Follow-up has been arranged with AF clinic in 1 week.  Physical Exam: Vitals:   03/14/18 1113 03/14/18 2012 03/15/18 0512 03/15/18 1136  BP: (!) 125/52 (!) 124/57 107/62 (!) 123/52  Pulse: (!) 54 (!) 52 (!) 52 (!) 52  Resp: 20 16  16 18   Temp:  98.6 F (37 C) 97.8 F (36.6 C) 97.7 F (36.5 C)  TempSrc:   Oral Oral  SpO2: 99% 100% 100% 98%  Weight:   77.8 kg   Height:        GEN- The patient is well appearing, alert and oriented x 3 today.   HEENT: normocephalic, atraumatic; sclera clear, conjunctiva pink; hearing intact; oropharynx clear; neck supple  Lungs- Clear to ausculation bilaterally, normal work of breathing.  No wheezes, rales, rhonchi Heart- Regular rate and rhythm  GI- soft, non-tender, non-distended, bowel sounds presen  Extremities- no clubbing, cyanosis, or edema   MS- no significant deformity or atrophy Skin- warm and dry, no rash or lesion Psych- euthymic mood, full affect Neuro- strength and sensation are intact   Labs:   Lab Results  Component Value Date   WBC 4.9 02/07/2018   HGB 15.3 02/07/2018   HCT 47.3 02/07/2018   MCV 86.2 02/07/2018   PLT 174 02/07/2018    Recent Labs  Lab 03/15/18 1243  NA 138  K 4.6  CL 106  CO2 24  BUN 23  CREATININE 1.17  CALCIUM 9.1  GLUCOSE 139*     Discharge Medications:  Allergies as of 03/15/2018      Reactions   Lipitor [atorvastatin] Other (See Comments)   myalgias      Medication List    TAKE these medications   apixaban 5 MG Tabs tablet Commonly known as:  ELIQUIS Take 1 tablet (5 mg total) by mouth 2 (two) times daily.  ARTIFICIAL TEARS OP Place 1 drop into both eyes 4 (four) times daily as needed (dry eyes).   BENEFIBER PO Take 2 Scoops by mouth daily.   carvedilol 6.25 MG tablet Commonly known as:  COREG Take 1 tablet (6.25 mg total) by mouth 2 (two) times daily.   cholecalciferol 1000 units tablet Commonly known as:  VITAMIN D Take 1,000 Units by mouth at bedtime.   clopidogrel 75 MG tablet Commonly known as:  PLAVIX Take 1 tablet (75 mg total) by mouth daily with breakfast.   dofetilide 125 MCG capsule Commonly known as:  TIKOSYN Take 1 capsule (125 mcg total) by mouth 2 (two) times daily.     fluticasone 50 MCG/ACT nasal spray Commonly known as:  FLONASE Place 1-2 sprays into both nostrils daily as needed for allergies.   hydrocortisone 2.5 % rectal cream Commonly known as:  ANUSOL-HC Place 1 application rectally 2 (two) times daily as needed for hemorrhoids.   Melatonin 10 MG Tabs Take 10 mg by mouth at bedtime as needed (for sleep).   nitroGLYCERIN 0.4 MG SL tablet Commonly known as:  NITROSTAT Place 1 tablet (0.4 mg total) under the tongue every 5 (five) minutes as needed for chest pain.   rosuvastatin 10 MG tablet Commonly known as:  CRESTOR Take 10 mg by mouth daily.   sacubitril-valsartan 49-51 MG Commonly known as:  ENTRESTO Take 1 tablet by mouth 2 (two) times daily.   spironolactone 25 MG tablet Commonly known as:  ALDACTONE Take 1 tablet (25 mg total) by mouth daily.   vitamin C 500 MG tablet Commonly known as:  ASCORBIC ACID Take 500 mg by mouth at bedtime.       Disposition:  Discharge Instructions    Diet - low sodium heart healthy   Complete by:  As directed    Increase activity slowly   Complete by:  As directed      Follow-up Information    Larey Dresser, MD Follow up on 03/18/2018.   Specialty:  Cardiology Why:  at [place] information: [place] [address] [place] [address] [phone]        [place] Follow up on 03/24/2018.   Specialty:  Cardiology Why:  at 3:30PM  Contact information: [address] [id number] [place] [address] [phone]       [name], MD Follow up on 05/07/2018.   Specialties:  Cardiology, Radiology Why:  at 9:45AM Contact information: [address] [place] [address] [phone]           Duration of Discharge Encounter: Greater than 30 minutes including physician time.  Signed, Lyda Jester, PA-C 03/15/2018 1:37 PM   I have seen, examined the patient, and reviewed the  above assessment and plan.  Changes to above are made where necessary.  On exam, RRR.  Qtc is stable on tikosyn 137mcg BID.  Will need close follow-up in AF clinic.  Co Sign: [name], MD 03/15/2018 1:55 PM

## 2018-03-14 NOTE — Progress Notes (Addendum)
Electrophysiology Rounding Note  Patient Name: Dillon Young Date of Encounter: 03/14/2018  Primary Cardiologist: [name] Heart Failure: [name] Electrophysiologist: Nihaal Friesen   Subjective   The patient is doing well today.  At this time, the patient denies chest pain, shortness of breath, or any new concerns.  Inpatient Medications    Scheduled Meds: . apixaban  5 mg Oral BID  . carvedilol  6.25 mg Oral BID  . cholecalciferol  1,000 Units Oral QHS  . clopidogrel  75 mg Oral QPM  . dofetilide  125 mcg Oral BID  . Melatonin  9 mg Oral QHS  . rosuvastatin  10 mg Oral Daily  . sacubitril-valsartan  1 tablet Oral BID  . sodium chloride flush  3 mL Intravenous Q12H  . spironolactone  25 mg Oral Daily  . vitamin C  500 mg Oral QHS   Continuous Infusions: . sodium chloride Stopped (03/13/18 1203)   PRN Meds: sodium chloride, fluticasone, hydrocortisone, polyvinyl alcohol, sodium chloride flush   Vital Signs    Vitals:   03/13/18 1546 03/13/18 2000 03/14/18 0425 03/14/18 0427  BP: (!) 112/50 (!) 113/53 (!) 127/57   Pulse: (!) 55 (!) 48 (!) 53   Resp:   16   Temp: (!) 97.5 F (36.4 C) 97.6 F (36.4 C) 97.6 F (36.4 C)   TempSrc: Oral Oral Oral   SpO2: 97% 98% 96%   Weight:    77.7 kg  Height:        Intake/Output Summary (Last 24 hours) at 03/14/2018 0746 Last data filed at 03/13/2018 2111 Gross per 24 hour  Intake 1003 ml  Output -  Net 1003 ml   Filed Weights   03/13/18 0608 03/13/18 1025 03/14/18 0427  Weight: 77.2 kg 77.2 kg 77.7 kg    Physical Exam    GEN- The patient is well appearing, alert and oriented x 3 today.   Head- normocephalic, atraumatic Eyes-  Sclera clear, conjunctiva pink Ears- hearing intact Oropharynx- clear Neck- supple Lungs- Clear to ausculation bilaterally, normal work of breathing Heart- Regular rate and rhythm  GI- soft, NT, ND, + BS Extremities- no clubbing, cyanosis, or edema Skin- no rash or lesion Psych- euthymic  mood, full affect Neuro- strength and sensation are intact  Labs    Basic Metabolic Panel Recent Labs    03/13/18 0601 03/14/18 0417  NA 139 137  K 4.1 3.9  CL 104 102  CO2 28 26  GLUCOSE 110* 115*  BUN 16 22  CREATININE 1.01 1.26*  CALCIUM 9.0 9.5  MG 1.9 2.1     Telemetry    SR, PVC's (personally reviewed)  Radiology    No results found.   Patient Profile     LANIS STORLIE is a 84 y.o. male admitted for Tikosyn load  Assessment & Plan    1.  Persistent atrial fibrillation Maintaining SR post cardioversion QTc is long this morning. Hold Tikosyn this am, restart 156mcg twice daily tonight BMET, Mg stable (will give 18meq of K+ this morning for K+ of 3.9) Continue Eliquis for CHADS2VASC of 8  2.  Chronic systolic heart failure Stable No change required today  3.  CAD Stable No change required today  Hopefully discharge to home tomorrow if QTc stable. He is applying for patient assistance and will need 2 week supply of Tikosyn at discharge.    For questions or updates, please contact [place] Please consult [email or web address] for contact info under Cardiology/STEMI.  Signed, Chanetta Marshall,  NP  03/14/2018, 7:46 AM   I have seen, examined the patient, and reviewed the above assessment and plan.  Changes to above are made where necessary.  On exam, RRR.  QT is prolonged.  Hold AM dose of tikosyn and then reduce to 125 mcg bid if qt remains stable.  Will keep in hospital another day.  Co Sign: [name], MD 03/14/2018

## 2018-03-15 LAB — BASIC METABOLIC PANEL
Anion gap: 8 (ref 5–15)
BUN: 23 mg/dL (ref 8–23)
CALCIUM: 9.1 mg/dL (ref 8.9–10.3)
CO2: 24 mmol/L (ref 22–32)
CREATININE: 1.17 mg/dL (ref 0.61–1.24)
Chloride: 106 mmol/L (ref 98–111)
GFR, EST NON AFRICAN AMERICAN: 56 mL/min — AB (ref >60)
Glucose, Bld: 139 mg/dL — ABNORMAL HIGH (ref 70–99)
Potassium: 4.6 mmol/L (ref 3.5–5.1)
SODIUM: 138 mmol/L (ref 135–145)

## 2018-03-15 MED ORDER — DOFETILIDE 125 MCG PO CAPS: 125.0000 ug | ORAL_CAPSULE | Freq: Two times a day (BID) | ORAL | 0 refills | Status: DC

## 2018-03-15 NOTE — Progress Notes (Addendum)
Doing well this am.  No concerns.  Awaiting post dose ekg this am.  I will determine disposition after I have reviewed ekg.  [name] MD, [facility] 03/15/2018 10:50 AM   QT is stable at this time.  OK to discharge with close follow-up in the AF clinic.

## 2018-03-17 ENCOUNTER — Other Ambulatory Visit: Payer: Self-pay | Admitting: Cardiovascular Disease

## 2018-03-17 ENCOUNTER — Telehealth: Payer: Self-pay | Admitting: *Deleted

## 2018-03-17 NOTE — Telephone Encounter (Signed)
Pt was on TCM report admitted 03/11/18 persistent atrial fibrillation which Tikosyn was started. Pt D/C 03/15/18, and will follow-up with AF clinic in 1 week, and his cardiologist on 03/18/18.Marland KitchenJohny Chess

## 2018-03-18 ENCOUNTER — Ambulatory Visit (HOSPITAL_COMMUNITY)
Admission: RE | Admit: 2018-03-18 | Discharge: 2018-03-18 | Disposition: A | Discharge status: Home / Self Care | Payer: PPO | Source: Ambulatory Visit | Attending: Cardiology | Admitting: Cardiology

## 2018-03-18 ENCOUNTER — Other Ambulatory Visit: Payer: Self-pay

## 2018-03-18 VITALS — BP 120/62 | HR 54 | Wt 177.6 lb

## 2018-03-18 DIAGNOSIS — I5022 Chronic systolic (congestive) heart failure: Secondary | ICD-10-CM | POA: Insufficient documentation

## 2018-03-18 DIAGNOSIS — Z8249 Family history of ischemic heart disease and other diseases of the circulatory system: Secondary | ICD-10-CM | POA: Diagnosis not present

## 2018-03-18 DIAGNOSIS — Z7902 Long term (current) use of antithrombotics/antiplatelets: Secondary | ICD-10-CM | POA: Insufficient documentation

## 2018-03-18 DIAGNOSIS — Z7901 Long term (current) use of anticoagulants: Secondary | ICD-10-CM | POA: Diagnosis not present

## 2018-03-18 DIAGNOSIS — Z7951 Long term (current) use of inhaled steroids: Secondary | ICD-10-CM | POA: Insufficient documentation

## 2018-03-18 DIAGNOSIS — E785 Hyperlipidemia, unspecified: Secondary | ICD-10-CM | POA: Diagnosis not present

## 2018-03-18 DIAGNOSIS — I255 Ischemic cardiomyopathy: Secondary | ICD-10-CM | POA: Diagnosis not present

## 2018-03-18 DIAGNOSIS — I110 Hypertensive heart disease with heart failure: Secondary | ICD-10-CM | POA: Insufficient documentation

## 2018-03-18 DIAGNOSIS — Z955 Presence of coronary angioplasty implant and graft: Secondary | ICD-10-CM | POA: Diagnosis not present

## 2018-03-18 DIAGNOSIS — Z79899 Other long term (current) drug therapy: Secondary | ICD-10-CM | POA: Diagnosis not present

## 2018-03-18 DIAGNOSIS — Z9861 Coronary angioplasty status: Secondary | ICD-10-CM | POA: Diagnosis not present

## 2018-03-18 DIAGNOSIS — I4819 Other persistent atrial fibrillation: Secondary | ICD-10-CM | POA: Insufficient documentation

## 2018-03-18 DIAGNOSIS — I2510 Atherosclerotic heart disease of native coronary artery without angina pectoris: Secondary | ICD-10-CM | POA: Insufficient documentation

## 2018-03-18 DIAGNOSIS — Z87891 Personal history of nicotine dependence: Secondary | ICD-10-CM | POA: Insufficient documentation

## 2018-03-18 LAB — CBC
HEMATOCRIT: 46.9 % (ref 39.0–52.0)
HEMOGLOBIN: 15.1 g/dL (ref 13.0–17.0)
MCH: 27.6 pg (ref 26.0–34.0)
MCHC: 32.2 g/dL (ref 30.0–36.0)
MCV: 85.6 fL (ref 80.0–100.0)
Platelets: 179 10*3/uL (ref 150–400)
RBC: 5.48 MIL/uL (ref 4.22–5.81)
RDW: 13.5 % (ref 11.5–15.5)
WBC: 4.4 10*3/uL (ref 4.0–10.5)
nRBC: 0 % (ref 0.0–0.2)

## 2018-03-18 LAB — BASIC METABOLIC PANEL
ANION GAP: 7 (ref 5–15)
BUN: 21 mg/dL (ref 8–23)
CHLORIDE: 105 mmol/L (ref 98–111)
CO2: 27 mmol/L (ref 22–32)
Calcium: 9.5 mg/dL (ref 8.9–10.3)
Creatinine, Ser: 1.07 mg/dL (ref 0.61–1.24)
GFR calc Af Amer: >60 mL/min (ref >60)
Glucose, Bld: 163 mg/dL — ABNORMAL HIGH (ref 70–99)
POTASSIUM: 4.7 mmol/L (ref 3.5–5.1)
SODIUM: 139 mmol/L (ref 135–145)

## 2018-03-18 NOTE — Patient Instructions (Signed)
Labs today  You have been referred to [facility], they will contact you to schedule  Your physician recommends that you schedule a follow-up appointment in: 3 months

## 2018-03-18 NOTE — Patient Outreach (Signed)
[facility] Care Management  [place]  03/18/2018  Dillon Young 04-Sep-1932 [id number]  84 year old male outreached by [place] services for a 30 day post discharge medication review.  PMHx includes, but not limited to, hypertension, coronary artery disease, atrial fibrillation, GERD, irritable bowel, h/o prostate cancer and insomnia.  Unsuccessful telephone call attempt #1 to patient.  Dillon Young requests that I call him after lunch.  Plan:  Outreach attempt this afternoon.  Joetta Manners, PharmD Clinical Pharmacist [place] [phone]  Addendum: Successful outreach to Dillon Young this afternoon.  HIPAA identifiers verified.   Subjective: Dillon Young reports that he went to his cardiologist today and he is still in normal sinus rhythm.  He states the he feels much better.  He states that he is getting patient assistance for Tikosyn.  Objective:  HgA1c 6.6% in 2017. SCr 1.07 mg/dL on 03/18/18  Current Medications: Current Outpatient Medications  Medication Sig Dispense Refill  . carvedilol (COREG) 6.25 MG tablet Take 1 tablet (6.25 mg total) by mouth 2 (two) times daily. 180 tablet 1  . cholecalciferol (VITAMIN D) 1000 UNITS tablet Take 1,000 Units by mouth at bedtime.     . clopidogrel (PLAVIX) 75 MG tablet Take 1 tablet (75 mg total) by mouth daily with breakfast. 90 tablet 3  . dofetilide (TIKOSYN) 125 MCG capsule Take 1 capsule (125 mcg total) by mouth 2 (two) times daily. 30 capsule 0  . ELIQUIS 5 MG TABS tablet TAKE 1 TABLET BY MOUTH TWICE A DAY 180 tablet 1  . fluticasone (FLONASE) 50 MCG/ACT nasal spray Place 1-2 sprays into both nostrils daily as needed for allergies. 48 g 1  . Hypromellose (ARTIFICIAL TEARS OP) Place 1 drop into both eyes 4 (four) times daily as needed (dry eyes).    . Melatonin 10 MG TABS Take 10 mg by mouth at bedtime as needed (for sleep).     . nitroGLYCERIN (NITROSTAT) 0.4 MG SL  tablet Place 1 tablet (0.4 mg total) under the tongue every 5 (five) minutes as needed for chest pain. 25 tablet 3  . rosuvastatin (CRESTOR) 10 MG tablet Take 10 mg by mouth daily.    . sacubitril-valsartan (ENTRESTO) 49-51 MG Take 1 tablet by mouth 2 (two) times daily. 60 tablet 11  . spironolactone (ALDACTONE) 25 MG tablet Take 1 tablet (25 mg total) by mouth daily. 30 tablet 5  . vitamin C (ASCORBIC ACID) 500 MG tablet Take 500 mg by mouth at bedtime.     . Wheat Dextrin (BENEFIBER PO) Take 2 Scoops by mouth daily.     . hydrocortisone (ANUSOL-HC) 2.5 % rectal cream Place 1 application rectally 2 (two) times daily as needed for hemorrhoids.      No current facility-administered medications for this visit.     Functional Status: In your present state of health, do you have any difficulty performing the following activities: 03/11/2018 12/26/2017  Hearing? [place]? N -  Difficulty concentrating or making decisions? N -  Walking or climbing stairs? N -  Dressing or bathing? N -  Doing errands, shopping? N N  Some recent data might be hidden    Fall/Depression Screening: Fall Risk  04/25/2017 01/25/2016 08/16/2015  Falls in the past year? No No No   PHQ 2/9 Scores 04/25/2017 01/25/2016 01/25/2016 08/16/2015 08/15/2015  PHQ - 2 Score 0 0 0 0 0   ASSESSMENT: Date Discharged from Hospital: 03/14/18 Date  Medication Reconciliation Performed: 03/18/2018  New Medications at Discharge:   Tikosyn  Patient was recently discharged from hospital and all medications have been reviewed  Drugs sorted by system:  Cardiovascular: carvedilol, clopidogrel, dofetilide, apixaban, nitroglycerin, rosuvastatin, sacubitril/valsartan, spironolactone  Pulmonary/Allergy: fluticasone  Topical: hydrocortisone cream, artifical tears  Vitamins/Minerals: cholecalciferol, vitamin C  Miscellaneous: melatonin,wheat dextrin   Medications to avoid in the elderly:   Drug interactions:   Spironolactone and sacubitril/valsartan- The risk of hyperkalemia may be increased when potassium-sparing diuretics are co-administered with angiotensin II receptor antagonists.  Use of direct Factor Xa Inhibitors (Eliquis) with antiplatelet agents (clopidogrel)  may increase the risk of bleeding.  Medication Assistance: Per financial discussion, Dillon Young may qualify for Entresto patient assistance from [employer]  He is under their income limit.   He is aware that he will need to spend 3% of income to qualify for Eliquis patient assistance.  He took down my phone number and will call me in early 2020 to apply for [facility] patient assistance.  PLAN: Route note to PCP, Dr. Ronnald Ramp.   Joetta Manners, PharmD Clinical Pharmacist [place] [phone]

## 2018-03-18 NOTE — Progress Notes (Signed)
PCP: Dr. Ronnald Ramp Cardiology: Dr. [name] HF Cardiology: Dr. [name]  84 y.o. with a h/o coronary artery disease, chronic systolic CHF, and persistent atrial fibrillation was referred by Dr. [name] for evaluation of CHF.  He is status post angioplasty in 1994 and 1995of his circumflex coronary artery. He had right carotid endarterectomy performed by Dr. Curt Jews June 2013.  Given angina, he had cath in 8/17.  This showed severe RCA stenosis treated with DES. He also had a very small ramus branch that was 90% stenosed and not suitable for intervention as well as a 70% mid LAD lesion although he had no evidence of anterior ischemia on his stress test.  He was diagnosed in 5/19 with new onset atrial fibrillation. Echo was done in 5/19 and showed EF decreased to 30-35%, normal EF by echo in August 2017. He had a DCCV in 5/19, unfortunately, he only held NSR for a few days and was been back in atrial fibrillation soon after that.  He had recurrent chest pain and was again cathed in 01/04/18, this time showing 80% proximal/mid LAD that was treated with DES.  In 10/19, he was admitted for Tikosyn load and was cardioverted back to NSR.   He follows up for evaluation of CHF and atrial fibrillation today.  He remains in NSR on Tikosyn.  No palpitations.  He walks 30 minutes on most days with his wife.  No significant exertional dyspnea or chest pain.  He notes some fatigue but no snoring or daytime sleepiness.  Weight is down 3 lbs.  Rare lightheadedness when standing.   ECG: sinus brady at 47, 1st degree AVB 222 msec, QTc 417 msec  Labs (5/19): LDL 50 Labs (8/19): K 4.1, creatinine 1.14 Labs (10/19): K 4.6, creatinine 1.17  PMH: 1. HTN 2. Hyperlipidemia 3. Carotid artery disease: Right CEA in 6/13.  4. CAD: - PTCA to LCx in 1994, 1995.  - Angina 8/17 => DES to RCA, also noted to have 70% mLAD, 90% stenosis small ramus.  - LHC 12/26/17 with 80% ostial-proximal LAD, EF < 25% on LV-gram.  DES to LAD.  5. Chronic  systolic CHF: Ischemic cardiomyopathy.   - Echo (8/17) with normal EF.  - Echo (5/19) with EF 30-35%, severe LV dilation, regional wall motion abnormalities.  6. Atrial fibrillation: Diagnosed 5/19.   - DCCV 5/19 only held for about 2 days.  - Tikosyn loaded in 10/19 and DCCV to NSR.   Social History   Socioeconomic History  . Marital status: Married    Spouse name: Not on file  . Number of children: 2  . Years of education: Not on file  . Highest education level: Not on file  Occupational History  . 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 Financial resource strain: Not on file  . Food insecurity:    Worry: Not on file    Inability: Not on file  . Transportation needs:    Medical: Not on file    Non-medical: Not on file  Tobacco Use  . Smoking status: Former Smoker    Packs/day: 1.50    Years: 60.00    Pack years: 90.00    Types: Cigarettes    Last attempt to quit: 07/25/2008    Years since quitting: 9.6  . Smokeless tobacco: Never Used  Substance and Sexual Activity  . Alcohol use: Yes    Alcohol/week: 0.0 standard drinks    Comment: Occ  . Drug use: No  . Sexual activity:  Not on file  Lifestyle  . Physical activity:    Days per week: Not on file    Minutes per session: Not on file  . Stress: Not on file  Relationships  . Social connections:    Talks on phone: Not on file    Gets together: Not on file    Attends religious service: Not on file    Active member of club or organization: Not on file    Attends meetings of clubs or organizations: Not on file    Relationship status: Not on file  . Intimate partner violence:    Fear of current or ex partner: Not on file    Emotionally abused: Not on file    Physically abused: Not on file    Forced sexual activity: Not on file  Other Topics Concern  . Not on file  Social History Narrative  . Not on file   Family History  Problem Relation Age of Onset  . Heart disease Father   . Hypertension  Mother   . Asthma Paternal Grandmother   . Colon cancer Neg Hx   . Esophageal cancer Neg Hx   . Stomach cancer Neg Hx   . Rectal cancer Neg Hx   . Liver cancer Neg Hx    ROS: All systems reviewed and negative except as per HPI.   Current Outpatient Medications  Medication Sig Dispense Refill  . carvedilol (COREG) 6.25 MG tablet Take 1 tablet (6.25 mg total) by mouth 2 (two) times daily. 180 tablet 1  . cholecalciferol (VITAMIN D) 1000 UNITS tablet Take 1,000 Units by mouth at bedtime.     . clopidogrel (PLAVIX) 75 MG tablet Take 1 tablet (75 mg total) by mouth daily with breakfast. 90 tablet 3  . dofetilide (TIKOSYN) 125 MCG capsule Take 1 capsule (125 mcg total) by mouth 2 (two) times daily. 30 capsule 0  . ELIQUIS 5 MG TABS tablet TAKE 1 TABLET BY MOUTH TWICE A DAY 180 tablet 1  . fluticasone (FLONASE) 50 MCG/ACT nasal spray Place 1-2 sprays into both nostrils daily as needed for allergies. 48 g 1  . hydrocortisone (ANUSOL-HC) 2.5 % rectal cream Place 1 application rectally 2 (two) times daily as needed for hemorrhoids.     . Hypromellose (ARTIFICIAL TEARS OP) Place 1 drop into both eyes 4 (four) times daily as needed (dry eyes).    . rosuvastatin (CRESTOR) 10 MG tablet Take 10 mg by mouth daily.    . sacubitril-valsartan (ENTRESTO) 49-51 MG Take 1 tablet by mouth 2 (two) times daily. 60 tablet 11  . spironolactone (ALDACTONE) 25 MG tablet Take 1 tablet (25 mg total) by mouth daily. 30 tablet 5  . Wheat Dextrin (BENEFIBER PO) Take 2 Scoops by mouth daily.     . Melatonin 10 MG TABS Take 10 mg by mouth at bedtime as needed (for sleep).     . nitroGLYCERIN (NITROSTAT) 0.4 MG SL tablet Place 1 tablet (0.4 mg total) under the tongue every 5 (five) minutes as needed for chest pain. 25 tablet 3  . vitamin C (ASCORBIC ACID) 500 MG tablet Take 500 mg by mouth at bedtime.      No current facility-administered medications for this encounter.    BP 120/62   Pulse (!) 54   Wt 80.6 kg (177 lb  9.6 oz)   SpO2 97%   BMI 26.23 kg/m  General: NAD Neck: No JVD, no thyromegaly or thyroid nodule.  Lungs: Clear to auscultation bilaterally  with normal respiratory effort. CV: Nondisplaced PMI.  Heart regular S1/S2, no S3/S4, no murmur.  No peripheral edema.  No carotid bruit.  Normal pedal pulses.  Abdomen: Soft, nontender, no hepatosplenomegaly, no distention.  Skin: Intact without lesions or rashes.  Neurologic: Alert and oriented x 3.  Psych: Normal affect. Extremities: No clubbing or cyanosis.  HEENT: Normal.   Assessment/Plan: 1. Chronic systolic CHF: Suspect mixed ischemic/nonischemic cardiomyopathy.  Last echo in 5/19 with EF 30-35% and severe LV dilation.  CAD likely plays role, but fall in EF was noted around the time he went into atrial fibrillation.  No RVR, but can see fall in EF with afib even in the absence of RVR.  He is not volume overloaded on exam, NYHA class II.  - Continue Coreg 6.25 mg bid. Will not increase with sinus bradycardia.  - Continue Entresto 49/51 bid, will not increase with occasional lightheadedness with standing.   - Continue spironolactone 25 mg daily.  - Repeat echo in 3 months at followup.  2. CAD: Most recently, DES to proximal/mid LAD on 12/26/17. No chest pain since then.  - Continue apixaban 5 mg bid  - Continue Plavix x at least 6 months.  - He is now off aspirin.  - He is interested in the [facility] exercise program, will refer.  - Continue Crestor, good LDL in 5/19.  3. Atrial fibrillation: Persistent.  DCCV in 5/19 only held a few days.  Tikosyn loaded in 10/19 with DCCV, now in NSR.  QTc ok on ECG today.  - Continue Eliquis 5 mg bid.  - Continue Tikosyn.   Followup in 3 months with echo.   [name] 03/18/2018

## 2018-03-19 ENCOUNTER — Ambulatory Visit (INDEPENDENT_AMBULATORY_CARE_PROVIDER_SITE_OTHER): Payer: PPO

## 2018-03-19 DIAGNOSIS — Z23 Encounter for immunization: Secondary | ICD-10-CM

## 2018-03-24 ENCOUNTER — Ambulatory Visit (HOSPITAL_COMMUNITY)
Admit: 2018-03-24 | Discharge: 2018-03-24 | Disposition: A | Discharge status: Home / Self Care | Payer: PPO | Attending: Nurse Practitioner | Admitting: Nurse Practitioner

## 2018-03-24 VITALS — BP 142/68 | HR 55 | Ht 69.0 in | Wt 175.0 lb

## 2018-03-24 DIAGNOSIS — I4819 Other persistent atrial fibrillation: Secondary | ICD-10-CM

## 2018-03-24 DIAGNOSIS — I4891 Unspecified atrial fibrillation: Secondary | ICD-10-CM | POA: Diagnosis not present

## 2018-03-24 DIAGNOSIS — K589 Irritable bowel syndrome without diarrhea: Secondary | ICD-10-CM | POA: Diagnosis not present

## 2018-03-24 DIAGNOSIS — E1122 Type 2 diabetes mellitus with diabetic chronic kidney disease: Secondary | ICD-10-CM | POA: Insufficient documentation

## 2018-03-24 DIAGNOSIS — Z7951 Long term (current) use of inhaled steroids: Secondary | ICD-10-CM | POA: Diagnosis not present

## 2018-03-24 DIAGNOSIS — Z86010 Personal history of colonic polyps: Secondary | ICD-10-CM | POA: Diagnosis not present

## 2018-03-24 DIAGNOSIS — Z8546 Personal history of malignant neoplasm of prostate: Secondary | ICD-10-CM | POA: Insufficient documentation

## 2018-03-24 DIAGNOSIS — I2510 Atherosclerotic heart disease of native coronary artery without angina pectoris: Secondary | ICD-10-CM | POA: Insufficient documentation

## 2018-03-24 DIAGNOSIS — Z7901 Long term (current) use of anticoagulants: Secondary | ICD-10-CM | POA: Diagnosis not present

## 2018-03-24 DIAGNOSIS — Z79899 Other long term (current) drug therapy: Secondary | ICD-10-CM | POA: Insufficient documentation

## 2018-03-24 DIAGNOSIS — N189 Chronic kidney disease, unspecified: Secondary | ICD-10-CM | POA: Insufficient documentation

## 2018-03-24 DIAGNOSIS — G4730 Sleep apnea, unspecified: Secondary | ICD-10-CM | POA: Insufficient documentation

## 2018-03-24 DIAGNOSIS — Z955 Presence of coronary angioplasty implant and graft: Secondary | ICD-10-CM | POA: Insufficient documentation

## 2018-03-24 DIAGNOSIS — N400 Enlarged prostate without lower urinary tract symptoms: Secondary | ICD-10-CM | POA: Insufficient documentation

## 2018-03-24 DIAGNOSIS — K219 Gastro-esophageal reflux disease without esophagitis: Secondary | ICD-10-CM | POA: Insufficient documentation

## 2018-03-24 DIAGNOSIS — Z87891 Personal history of nicotine dependence: Secondary | ICD-10-CM | POA: Insufficient documentation

## 2018-03-24 DIAGNOSIS — Z8673 Personal history of transient ischemic attack (TIA), and cerebral infarction without residual deficits: Secondary | ICD-10-CM | POA: Insufficient documentation

## 2018-03-24 DIAGNOSIS — I130 Hypertensive heart and chronic kidney disease with heart failure and stage 1 through stage 4 chronic kidney disease, or unspecified chronic kidney disease: Secondary | ICD-10-CM | POA: Insufficient documentation

## 2018-03-24 DIAGNOSIS — E785 Hyperlipidemia, unspecified: Secondary | ICD-10-CM | POA: Insufficient documentation

## 2018-03-24 DIAGNOSIS — I509 Heart failure, unspecified: Secondary | ICD-10-CM | POA: Diagnosis not present

## 2018-03-24 LAB — BASIC METABOLIC PANEL
Anion gap: 6 (ref 5–15)
BUN: 18 mg/dL (ref 8–23)
CALCIUM: 9.4 mg/dL (ref 8.9–10.3)
CO2: 28 mmol/L (ref 22–32)
CREATININE: 1.12 mg/dL (ref 0.61–1.24)
Chloride: 106 mmol/L (ref 98–111)
GFR calc non Af Amer: 59 mL/min — ABNORMAL LOW (ref >60)
Glucose, Bld: 104 mg/dL — ABNORMAL HIGH (ref 70–99)
Potassium: 4.5 mmol/L (ref 3.5–5.1)
Sodium: 140 mmol/L (ref 135–145)

## 2018-03-24 LAB — MAGNESIUM: Magnesium: 2.2 mg/dL (ref 1.7–2.4)

## 2018-03-24 NOTE — Progress Notes (Signed)
Primary Care Physician: [name], MD Referring Physician:Dr. [name] EP; Dr. Soundra Pilon Dillon Young is a 84 y.o. male with a h/o coronary artery disease status post angioplasty in 1994 and 1995of his circumflex coronary artery. . He has a history of hypertension and hyperlipidemia on medication. He had right carotid endarterectomy performed by Dr. Curt Jews June 2013.Myoview stress test performed 01/09/16 was notable for ischemia in the RCA distribution. Based on this he underwent cardiac catheterization by Dr. Varanasi8/21/17 revealing a 90% segmental mid dominant RCA stenosis which was stented with a Synergy 3.5 mm x 20 mm long drug-eluting stent. He did have a very small ramus branch that was 90% stenosed and not suitable for intervention as well as a 70% mid LAD lesion although he had no evidence of anterior ischemia on his stress test. His symptoms of chest pain have resolved.  He was diagnosed in May 2019 with new  onset  afib from which the pt is asymptomatic. Echo was done and showed EF decreased to 30-35%, normal EF by echo in August 2017. He has been on Eliquis 5 mg bid since early May without missed doses.  Plavix were stopped at that time, ASA continued and pt was started on carvedilol 6.25 mg bid.He states that he had a sleep study in the past but never slept enough with the study to get good results. He is afraid that he would never be able to tolerate a cpap. Sleeps in a recliner now, " I just feel more comfortable".  F/u in afib clinic 10/03/17, unfortunately cardioversion only held for 1-2 days.He developed  exertional chest discomfort   and was seen by R. Barrett, PA and had a cardiac cath which pt did receive a stent in the LAD. He feels improved but still remains in afib.  He did have to hold anticoagulation for 1-2 days and was back on anticoagulation without interruption on 8/23.  We did discuss trying to restore SR with EF moderately reduced and he would like to purse  after his vacation at the end of the month.  F/u in afib clinic, 01/31/18. He is back in afib clinic to discuss Tikosyn admit.  He has recently seen Dr. [name] and he also encouraged pt to consider Tiksoyn for his LV dysfunction.Marland Kitchen He is feeling better and was hoping he could get by without drug. He will commit but wants to wait until 10/1. He also reports that he has been having h/a's ever since he started Imdur. I discussed with Doroteo Bradford, PharmD, and  Imdur was eventually stopped with resolution of H/A.  Mr. More is now back in afib clinic for tikosyn admit.  No misses eliquis doses and no benadryl use.PharmD has screened drugs and no qt prolonging drugs. He will be applying for drug assistance and papers received and reviewed.  F/u 10/28, s/p Tikosyn admit. He is in SR with 125 mcg of Tikosyn. He will be getting drug thru pharmacy  assistance program. He feels improved.  Today, he denies symptoms of palpitations, shortness of breath, orthopnea, PND, lower extremity edema, dizziness, presyncope, syncope, or neurologic sequela.  The patient is tolerating medications without difficulties and is otherwise without complaint today.   Past Medical History:  Diagnosis Date  . Adenomatous colon polyp   . Allergic rhinitis   . BPH (benign prostatic hyperplasia)   . CAD (coronary artery disease)    sees Dr. [name]   . Carotid artery disease ([place])   . CHF (  congestive heart failure) ([place])   . Chronic kidney disease    [place]. Prostate cancer  . Clotting disorder ([place])   . CVA (cerebral infarction)   . Diabetes mellitus    "Borderline"  . Diverticulitis   . GERD (gastroesophageal reflux disease)    history  . Hemorrhoids   . HTN (hypertension)   . Hyperlipidemia LDL goal <70   . IBS (irritable bowel syndrome)   . Low back pain   . Prostate cancer ([place]) 2008   seed implantation  . Sleep apnea    last study was aborted.  Has never worn a CPAP.    Marland Kitchen Smoker    Past Surgical History:    Procedure Laterality Date  . APPENDECTOMY    . CARDIAC CATHETERIZATION N/A 01/16/2016   Procedure: Left Heart Cath and Coronary Angiography;  Surgeon: Jettie Booze, MD;  Location: [place] [facility] LAB;  Service: Cardiovascular;  Laterality: N/A;  . CARDIAC CATHETERIZATION N/A 01/16/2016   Procedure: Coronary Stent Intervention;  Surgeon: Jettie Booze, MD;  Location: [place] [facility] LAB;  Service: Cardiovascular;  Laterality: N/A;  . CARDIOVERSION N/A 11/26/2017   Procedure: CARDIOVERSION;  Surgeon: [name], MD;  Location: [facility] ENDOSCOPY;  Service: Cardiovascular;  Laterality: N/A;  . CARDIOVERSION N/A 03/13/2018   Procedure: CARDIOVERSION;  Surgeon: [name], MD;  Location: [facility] ENDOSCOPY;  Service: Cardiovascular;  Laterality: N/A;  . CATARACT EXTRACTION W/ INTRAOCULAR LENS  IMPLANT, BILATERAL Bilateral   . COLONOSCOPY  10/21/2008   diverticulosis, radiation proctitis, internal hemorrhoids  . CORONARY STENT INTERVENTION N/A 12/26/2017   Procedure: CORONARY STENT INTERVENTION;  Surgeon: [name], MD;  Location: [place] CV LAB;  Service: Cardiovascular;  Laterality: N/A;  . ENDARTERECTOMY  11/12/2011   Procedure: ENDARTERECTOMY CAROTID;  Surgeon: Rosetta Posner, MD;  Location[facility];  Service: Vascular;  Laterality: Right;  . ESOPHAGOGASTRODUODENOSCOPY (EGD) WITH ESOPHAGEAL DILATION    . HEMORRHOID BANDING    . LEFT HEART CATH AND CORONARY ANGIOGRAPHY N/A 12/26/2017   Procedure: LEFT HEART CATH AND CORONARY ANGIOGRAPHY;  Surgeon: [name], MD;  Location: [place] CV LAB;  Service: Cardiovascular;  Laterality: N/A;  . LITHOTRIPSY  X 3  . LUMBAR LAMINECTOMY    . TRANSURETHRAL RESECTION OF PROSTATE  10/2003    Current Outpatient Medications  Medication Sig Dispense Refill  . carvedilol (COREG) 6.25 MG tablet Take 1 tablet (6.25 mg total) by mouth 2 (two) times daily. 180 tablet 1  . cholecalciferol (VITAMIN D) 1000 UNITS tablet Take 1,000 Units by mouth  at bedtime.     . clopidogrel (PLAVIX) 75 MG tablet Take 1 tablet (75 mg total) by mouth daily with breakfast. 90 tablet 3  . dofetilide (TIKOSYN) 125 MCG capsule Take 1 capsule (125 mcg total) by mouth 2 (two) times daily. 30 capsule 0  . ELIQUIS 5 MG TABS tablet TAKE 1 TABLET BY MOUTH TWICE A DAY 180 tablet 1  . fluticasone (FLONASE) 50 MCG/ACT nasal spray Place 1-2 sprays into both nostrils daily as needed for allergies. 48 g 1  . hydrocortisone (ANUSOL-HC) 2.5 % rectal cream Place 1 application rectally 2 (two) times daily as needed for hemorrhoids.     . Hypromellose (ARTIFICIAL TEARS OP) Place 1 drop into both eyes 4 (four) times daily as needed (dry eyes).    . Melatonin 10 MG TABS Take 10 mg by mouth at bedtime as needed (for sleep).     . nitroGLYCERIN (NITROSTAT) 0.4 MG  SL tablet Place 1 tablet (0.4 mg total) under the tongue every 5 (five) minutes as needed for chest pain. 25 tablet 3  . rosuvastatin (CRESTOR) 10 MG tablet Take 10 mg by mouth daily.    . sacubitril-valsartan (ENTRESTO) 49-51 MG Take 1 tablet by mouth 2 (two) times daily. 60 tablet 11  . spironolactone (ALDACTONE) 25 MG tablet Take 1 tablet (25 mg total) by mouth daily. 30 tablet 5  . vitamin C (ASCORBIC ACID) 500 MG tablet Take 500 mg by mouth at bedtime.     . Wheat Dextrin (BENEFIBER PO) Take 2 Scoops by mouth daily.      No current facility-administered medications for this encounter.     Allergies  Allergen Reactions  . Lipitor [Atorvastatin] Other (See Comments)    myalgias    Social History   Socioeconomic History  . Marital status: Married    Spouse name: Not on file  . Number of children: 2  . Years of education: Not on file  . Highest education level: Not on file  Occupational History  . 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 Financial resource strain: Not on file  . Food insecurity:    Worry: Not on file    Inability: Not on file  . Transportation needs:    Medical: Not on  file    Non-medical: Not on file  Tobacco Use  . Smoking status: Former Smoker    Packs/day: 1.50    Years: 60.00    Pack years: 90.00    Types: Cigarettes    Last attempt to quit: 07/25/2008    Years since quitting: 9.6  . Smokeless tobacco: Never Used  Substance and Sexual Activity  . Alcohol use: Yes    Alcohol/week: 0.0 standard drinks    Comment: Occ  . Drug use: No  . Sexual activity: Not on file  Lifestyle  . Physical activity:    Days per week: Not on file    Minutes per session: Not on file  . Stress: Not on file  Relationships  . Social connections:    Talks on phone: Not on file    Gets together: Not on file    Attends religious service: Not on file    Active member of club or organization: Not on file    Attends meetings of clubs or organizations: Not on file    Relationship status: Not on file  . Intimate partner violence:    Fear of current or ex partner: Not on file    Emotionally abused: Not on file    Physically abused: Not on file    Forced sexual activity: Not on file  Other Topics Concern  . Not on file  Social History Narrative  . Not on file    Family History  Problem Relation Age of Onset  . Heart disease Father   . Hypertension Mother   . Asthma Paternal Grandmother   . Colon cancer Neg Hx   . Esophageal cancer Neg Hx   . Stomach cancer Neg Hx   . Rectal cancer Neg Hx   . Liver cancer Neg Hx     ROS- All systems are reviewed and negative except as per the HPI above  Physical Exam: Vitals:   03/24/18 1534  BP: (!) 142/68  Pulse: (!) 55  Weight: 79.4 kg  Height: 5\' 9"  (1.753 m)   Wt Readings from Last 3 Encounters:  03/24/18 79.4 kg  03/18/18 80.6 kg  03/15/18  77.8 kg    Labs: Lab Results  Component Value Date   NA 139 03/18/2018   K 4.7 03/18/2018   CL 105 03/18/2018   CO2 27 03/18/2018   GLUCOSE 163 (H) 03/18/2018   BUN 21 03/18/2018   CREATININE 1.07 03/18/2018   CALCIUM 9.5 03/18/2018   MG 2.1 03/14/2018   Lab  Results  Component Value Date   INR 1.1 12/18/2017   Lab Results  Component Value Date   CHOL 121 10/02/2017   HDL 46 10/02/2017   LDLCALC 50 10/02/2017   TRIG 127 10/02/2017     GEN- The patient is well appearing, alert and oriented x 3 today.   Head- normocephalic, atraumatic Eyes-  Sclera clear, conjunctiva pink Ears- hearing intact Oropharynx- clear Neck- supple, no JVP Lymph- no cervical lymphadenopathy Lungs- Clear to ausculation bilaterally, normal work of breathing Heart- regular rate and rhythm, no murmurs, rubs or gallops, PMI not laterally displaced GI- soft, NT, ND, + BS Extremities- no clubbing, cyanosis, or edema MS- no significant deformity or atrophy Skin- no rash or lesion Psych- euthymic mood, full affect Neuro- strength and sensation are intact  EKG-sinus brady at 55 bpm, Pr int 224 ms, qrs int 90 ms, qtc 436 ms couplets Echo-Study Conclusions  - Left ventricle: The cavity size was severely dilated. Systolic   function was moderately to severely reduced. The estimated   ejection fraction was in the range of 30% to 35%. There is   hypokinesis of the lateral myocardium. There is akinesis of the   inferolateral and apical myocardium. The study was not   technically sufficient to allow evaluation of LV diastolic   dysfunction due to atrial fibrillation. - Aorta: Maximal diameter of abdominal aorta: 41 mm. - Abdominal aorta: The abdominal aorta was moderately dilated. - Tricuspid valve: There was trivial regurgitation. - Pulmonic valve: There was trivial regurgitation.    Assessment and Plan: 1. New onset afib May 2019 He is now in [place] s/p tikosyn load Tikosyn precautions reviewed No missed doses of eliquis  qtc stable at  436 ms Bmet/mag drawn   2. LV dysfunction No unusual shortness of breath Currently weight is stable Continue BB/ARB/spironolactone  Per  Dr. [name]   3. CAD Stable  F/u in afib clinic one month for Tikosyn  surveillance  F/u Dr. [name] as scheduled 12/11, Dr. [name] 1/22  Geroge Baseman. Rudy Domek, [facility] [address] [place], [place] [address] [phone]

## 2018-03-25 ENCOUNTER — Encounter (HOSPITAL_COMMUNITY): Payer: Self-pay

## 2018-03-27 ENCOUNTER — Encounter (HOSPITAL_COMMUNITY): Payer: Self-pay

## 2018-04-14 ENCOUNTER — Encounter (HOSPITAL_COMMUNITY): Payer: Self-pay | Admitting: *Deleted

## 2018-04-16 ENCOUNTER — Encounter: Payer: Self-pay | Admitting: Family

## 2018-04-16 ENCOUNTER — Ambulatory Visit (INDEPENDENT_AMBULATORY_CARE_PROVIDER_SITE_OTHER): Payer: PPO | Admitting: Family

## 2018-04-16 VITALS — BP 130/82 | HR 52 | Temp 97.6°F | Ht 69.0 in | Wt 178.1 lb

## 2018-04-16 DIAGNOSIS — J019 Acute sinusitis, unspecified: Secondary | ICD-10-CM

## 2018-04-16 MED ORDER — PREDNISONE 20 MG PO TABS: 20.0000 mg | ORAL_TABLET | Freq: Every day | ORAL | 0 refills | Status: DC

## 2018-04-16 MED ORDER — CEFDINIR 300 MG PO CAPS: 300.0000 mg | ORAL_CAPSULE | Freq: Two times a day (BID) | ORAL | 0 refills | Status: DC

## 2018-04-16 MED ORDER — IPRATROPIUM BROMIDE 0.06 % NA SOLN: 2.0000 | Freq: Four times a day (QID) | NASAL | 12 refills | Status: DC

## 2018-04-16 NOTE — Progress Notes (Signed)
Dillon Young is a 84 y.o. male with the following history as recorded in EpicCare:  Patient Active Problem List   Diagnosis Date Noted  . CAD (coronary artery disease) 12/26/2017  . Left ventricular dysfunction 11/06/2017  . Persistent atrial fibrillation 10/02/2017  . RTI (respiratory tract infection) 08/14/2017  . Aortic atherosclerosis ([place]) 05/02/2017  . Cough 03/31/2016  . Primary osteoarthritis of both knees 03/13/2016  . Arrhythmia 01/10/2016  . Bradycardia 12/15/2015  . Routine general medical examination at a health care facility 08/16/2015  . Middle insomnia 08/15/2015  . Carotid artery disease- s/p RCE June 2013 11/01/2011  . PVC's (premature ventricular contractions) 05/07/2011  . Radiation proctitis 03/30/2011  . Hemorrhoids, internal, with bleeding, prolapse and itching 02/02/2011  . Constipation, chronic 02/02/2011  . CAD S/P multiple PCIs 09/08/2008  . Cerebrovascular disease 09/08/2008  . Allergic rhinitis 09/08/2008  . Diabetes mellitus without complication ([place]) 44/05/270  . HISTORY OF ADENOCARCINOMA, PROSTATE 09/06/2008    Class: History of  . Hyperlipidemia with target LDL less than 70 07/02/2008  . Essential hypertension 07/02/2008  . GERD 07/02/2008  . Irritable bowel syndrome 07/02/2008  . LOW BACK PAIN SYNDROME 07/02/2008    Current Outpatient Medications  Medication Sig Dispense Refill  . carvedilol (COREG) 6.25 MG tablet Take 1 tablet (6.25 mg total) by mouth 2 (two) times daily. 180 tablet 1  . chlorhexidine (PERIDEX) 0.12 % solution RINSE 2 TIMES A DAY IN THE MORNING AND BEFORE BEDTIME FOR 30 SECONDS, USE SYRINGE AND PROXYBRUSH  1  . cholecalciferol (VITAMIN D) 1000 UNITS tablet Take 1,000 Units by mouth at bedtime.     . clopidogrel (PLAVIX) 75 MG tablet Take 1 tablet (75 mg total) by mouth daily with breakfast. 90 tablet 3  . dofetilide (TIKOSYN) 125 MCG capsule Take 1 capsule (125 mcg total) by mouth 2 (two) times daily. 30 capsule 0  .  ELIQUIS 5 MG TABS tablet TAKE 1 TABLET BY MOUTH TWICE A DAY 180 tablet 1  . hydrocortisone (ANUSOL-HC) 2.5 % rectal cream Place 1 application rectally 2 (two) times daily as needed for hemorrhoids.     . Hypromellose (ARTIFICIAL TEARS OP) Place 1 drop into both eyes 4 (four) times daily as needed (dry eyes).    . Melatonin 10 MG TABS Take 10 mg by mouth at bedtime as needed (for sleep).     . nitroGLYCERIN (NITROSTAT) 0.4 MG SL tablet Place 1 tablet (0.4 mg total) under the tongue every 5 (five) minutes as needed for chest pain. 25 tablet 3  . rosuvastatin (CRESTOR) 10 MG tablet Take 10 mg by mouth daily.    . sacubitril-valsartan (ENTRESTO) 49-51 MG Take 1 tablet by mouth 2 (two) times daily. 60 tablet 11  . spironolactone (ALDACTONE) 25 MG tablet Take 1 tablet (25 mg total) by mouth daily. 30 tablet 5  . vitamin C (ASCORBIC ACID) 500 MG tablet Take 500 mg by mouth at bedtime.     . Wheat Dextrin (BENEFIBER PO) Take 2 Scoops by mouth daily.     . cefdinir (OMNICEF) 300 MG capsule Take 1 capsule (300 mg total) by mouth 2 (two) times daily. 20 capsule 0  . ipratropium (ATROVENT) 0.06 % nasal spray Place 2 sprays into both nostrils 4 (four) times daily. 15 mL 12  . predniSONE (DELTASONE) 20 MG tablet Take 1 tablet (20 mg total) by mouth daily with breakfast. 5 tablet 0   No current facility-administered medications for this visit.     Allergies:  Lipitor [atorvastatin]  Past Medical History:  Diagnosis Date  . Adenomatous colon polyp   . Allergic rhinitis   . BPH (benign prostatic hyperplasia)   . CAD (coronary artery disease)    sees Dr. [name]   . Carotid artery disease ([place])   . CHF (congestive heart failure) ([place])   . Chronic kidney disease    Sones. Prostate cancer  . Clotting disorder ([place])   . CVA (cerebral infarction)   . Diabetes mellitus    "Borderline"  . Diverticulitis   . GERD (gastroesophageal reflux disease)    history  . Hemorrhoids   . HTN (hypertension)   .  Hyperlipidemia LDL goal <70   . IBS (irritable bowel syndrome)   . Low back pain   . Prostate cancer ([place]) 2008   seed implantation  . Sleep apnea    last study was aborted.  Has never worn a CPAP.    Marland Kitchen Smoker     Past Surgical History:  Procedure Laterality Date  . APPENDECTOMY    . CARDIAC CATHETERIZATION N/A 01/16/2016   Procedure: Left Heart Cath and Coronary Angiography;  Surgeon: Jettie Booze, MD;  Location: [place] [facility];  Service: Cardiovascular;  Laterality: N/A;  . CARDIAC CATHETERIZATION N/A 01/16/2016   Procedure: Coronary Stent Intervention;  Surgeon: Jettie Booze, MD;  Location: [place] [facility];  Service: Cardiovascular;  Laterality: N/A;  . CARDIOVERSION N/A 11/26/2017   Procedure: CARDIOVERSION;  Surgeon: [name], MD;  Location: [facility] ENDOSCOPY;  Service: Cardiovascular;  Laterality: N/A;  . CARDIOVERSION N/A 03/13/2018   Procedure: CARDIOVERSION;  Surgeon: [name], MD;  Location: [facility] ENDOSCOPY;  Service: Cardiovascular;  Laterality: N/A;  . CATARACT EXTRACTION W/ INTRAOCULAR LENS  IMPLANT, BILATERAL Bilateral   . COLONOSCOPY  10/21/2008   diverticulosis, radiation proctitis, internal hemorrhoids  . CORONARY STENT INTERVENTION N/A 12/26/2017   Procedure: CORONARY STENT INTERVENTION;  Surgeon: [name], MD;  Location: [place] [facility];  Service: Cardiovascular;  Laterality: N/A;  . ENDARTERECTOMY  11/12/2011   Procedure: ENDARTERECTOMY CAROTID;  Surgeon: Rosetta Posner, MD;  Location: [facility] OR;  Service: Vascular;  Laterality: Right;  . ESOPHAGOGASTRODUODENOSCOPY (EGD) WITH ESOPHAGEAL DILATION    . HEMORRHOID BANDING    . LEFT HEART CATH AND CORONARY ANGIOGRAPHY N/A 12/26/2017   Procedure: LEFT HEART CATH AND CORONARY ANGIOGRAPHY;  Surgeon: [name], MD;  Location: [place] [facility] LAB;  Service: Cardiovascular;  Laterality: N/A;  . LITHOTRIPSY  X 3  . LUMBAR LAMINECTOMY    . TRANSURETHRAL RESECTION OF PROSTATE  10/2003    Family  History  Problem Relation Age of Onset  . Heart disease Father   . Hypertension Mother   . Asthma Paternal Grandmother   . Colon cancer Neg Hx   . Esophageal cancer Neg Hx   . Stomach cancer Neg Hx   . Rectal cancer Neg Hx   . Liver cancer Neg Hx     Social History   Tobacco Use  . Smoking status: Former Smoker    Packs/day: 1.50    Years: 60.00    Pack years: 90.00    Types: Cigarettes    Last attempt to quit: 07/25/2008    Years since quitting: 9.7  . Smokeless tobacco: Never Used  Substance Use Topics  . Alcohol use: Yes    Alcohol/week: 0.0 standard drinks    Comment: Occ    Subjective:  Head congestion x 3 days; + nasal drainage; +  headache/ sinus pain, pressure; no fever; has Flonase in the past but concerned to use due to nose bleeds; also using nasal saline; no fever; worried about upcoming trip to [place] next week;    Objective:  Vitals:   04/16/18 1310  BP: 130/82  Pulse: (!) 52  Temp: 97.6 F (36.4 C)  TempSrc: Oral  SpO2: 98%  Weight: 178 lb 1.3 oz (80.8 kg)  Height: 5\' 9"  (1.753 m)    General: Well developed, well nourished, in no acute distress  Skin : Warm and dry.  Head: Normocephalic and atraumatic  Eyes: Sclera and conjunctiva clear; pupils round and reactive to light; extraocular movements intact  Ears: External normal; canals clear; tympanic membranes normal  Oropharynx: Pink, supple. No suspicious lesions  Neck: Supple without thyromegaly, adenopathy  Lungs: Respirations unlabored; Musculoskeletal: No deformities; no active joint inflammation  Extremities: No edema, cyanosis, clubbing  Vessels: Symmetric bilaterally  Neurologic: Alert and oriented; speech intact; face symmetrical; moves all extremities well; CNII-XII intact without focal deficit   Assessment:  1. Acute sinusitis, recurrence not specified, unspecified location     Plan:  Rx for Omnicef 300 mg bid x 10 days, Prednisone 20 mg qd x 5 days; stressed need to stop using Afrin-  okay to use Atrovent; increase fluids, rest and follow up worse, no better.   No follow-ups on file.  No orders of the defined types were placed in this encounter.   Requested Prescriptions   Signed Prescriptions Disp Refills  . cefdinir (OMNICEF) 300 MG capsule 20 capsule 0    Sig: Take 1 capsule (300 mg total) by mouth 2 (two) times daily.  . predniSONE (DELTASONE) 20 MG tablet 5 tablet 0    Sig: Take 1 tablet (20 mg total) by mouth daily with breakfast.  . ipratropium (ATROVENT) 0.06 % nasal spray 15 mL 12    Sig: Place 2 sprays into both nostrils 4 (four) times daily.

## 2018-04-17 ENCOUNTER — Encounter (HOSPITAL_COMMUNITY): Payer: Self-pay | Admitting: Nurse Practitioner

## 2018-04-17 ENCOUNTER — Ambulatory Visit (HOSPITAL_COMMUNITY)
Admission: RE | Admit: 2018-04-17 | Discharge: 2018-04-17 | Disposition: A | Discharge status: Home / Self Care | Payer: PPO | Source: Ambulatory Visit | Attending: Nurse Practitioner | Admitting: Nurse Practitioner

## 2018-04-17 VITALS — BP 136/76 | HR 68 | Ht 69.0 in | Wt 177.0 lb

## 2018-04-17 DIAGNOSIS — I2510 Atherosclerotic heart disease of native coronary artery without angina pectoris: Secondary | ICD-10-CM | POA: Diagnosis not present

## 2018-04-17 DIAGNOSIS — I509 Heart failure, unspecified: Secondary | ICD-10-CM | POA: Insufficient documentation

## 2018-04-17 DIAGNOSIS — E1122 Type 2 diabetes mellitus with diabetic chronic kidney disease: Secondary | ICD-10-CM | POA: Diagnosis not present

## 2018-04-17 DIAGNOSIS — E785 Hyperlipidemia, unspecified: Secondary | ICD-10-CM | POA: Insufficient documentation

## 2018-04-17 DIAGNOSIS — G4730 Sleep apnea, unspecified: Secondary | ICD-10-CM | POA: Insufficient documentation

## 2018-04-17 DIAGNOSIS — Z87891 Personal history of nicotine dependence: Secondary | ICD-10-CM | POA: Insufficient documentation

## 2018-04-17 DIAGNOSIS — Z7902 Long term (current) use of antithrombotics/antiplatelets: Secondary | ICD-10-CM | POA: Insufficient documentation

## 2018-04-17 DIAGNOSIS — Z8673 Personal history of transient ischemic attack (TIA), and cerebral infarction without residual deficits: Secondary | ICD-10-CM | POA: Diagnosis not present

## 2018-04-17 DIAGNOSIS — Z8249 Family history of ischemic heart disease and other diseases of the circulatory system: Secondary | ICD-10-CM | POA: Diagnosis not present

## 2018-04-17 DIAGNOSIS — Z8546 Personal history of malignant neoplasm of prostate: Secondary | ICD-10-CM | POA: Insufficient documentation

## 2018-04-17 DIAGNOSIS — K589 Irritable bowel syndrome without diarrhea: Secondary | ICD-10-CM | POA: Diagnosis not present

## 2018-04-17 DIAGNOSIS — Z7901 Long term (current) use of anticoagulants: Secondary | ICD-10-CM | POA: Diagnosis not present

## 2018-04-17 DIAGNOSIS — I4891 Unspecified atrial fibrillation: Secondary | ICD-10-CM | POA: Diagnosis not present

## 2018-04-17 DIAGNOSIS — Z955 Presence of coronary angioplasty implant and graft: Secondary | ICD-10-CM | POA: Diagnosis not present

## 2018-04-17 DIAGNOSIS — N189 Chronic kidney disease, unspecified: Secondary | ICD-10-CM | POA: Diagnosis not present

## 2018-04-17 DIAGNOSIS — I4819 Other persistent atrial fibrillation: Secondary | ICD-10-CM | POA: Diagnosis not present

## 2018-04-17 DIAGNOSIS — Z79899 Other long term (current) drug therapy: Secondary | ICD-10-CM | POA: Insufficient documentation

## 2018-04-17 DIAGNOSIS — I493 Ventricular premature depolarization: Secondary | ICD-10-CM | POA: Insufficient documentation

## 2018-04-17 DIAGNOSIS — I440 Atrioventricular block, first degree: Secondary | ICD-10-CM | POA: Insufficient documentation

## 2018-04-17 DIAGNOSIS — I130 Hypertensive heart and chronic kidney disease with heart failure and stage 1 through stage 4 chronic kidney disease, or unspecified chronic kidney disease: Secondary | ICD-10-CM | POA: Insufficient documentation

## 2018-04-17 DIAGNOSIS — K219 Gastro-esophageal reflux disease without esophagitis: Secondary | ICD-10-CM | POA: Diagnosis not present

## 2018-04-17 DIAGNOSIS — N400 Enlarged prostate without lower urinary tract symptoms: Secondary | ICD-10-CM | POA: Insufficient documentation

## 2018-04-17 DIAGNOSIS — Z7952 Long term (current) use of systemic steroids: Secondary | ICD-10-CM | POA: Diagnosis not present

## 2018-04-17 LAB — BASIC METABOLIC PANEL
ANION GAP: 7 (ref 5–15)
BUN: 24 mg/dL — ABNORMAL HIGH (ref 8–23)
CALCIUM: 9.5 mg/dL (ref 8.9–10.3)
CO2: 27 mmol/L (ref 22–32)
Chloride: 106 mmol/L (ref 98–111)
Creatinine, Ser: 1.08 mg/dL (ref 0.61–1.24)
GFR calc Af Amer: >60 mL/min (ref >60)
GLUCOSE: 128 mg/dL — AB (ref 70–99)
Potassium: 4.5 mmol/L (ref 3.5–5.1)
Sodium: 140 mmol/L (ref 135–145)

## 2018-04-17 LAB — MAGNESIUM: Magnesium: 2 mg/dL (ref 1.7–2.4)

## 2018-04-17 NOTE — Progress Notes (Signed)
Primary Care Physician: Dillon Lima, MD Referring Physician:Dr. Gwenlyn Young EP; Dr. Soundra Young Dillon Young is a 84 y.o. male with a h/o coronary artery disease status post angioplasty in 1994 and 1995of his circumflex coronary artery. . He has a history of hypertension and hyperlipidemia on medication. He had right carotid endarterectomy performed by Dr. Curt Young June 2013.Myoview stress test performed 01/09/16 was notable for ischemia in the RCA distribution. Based on this he underwent cardiac catheterization by Dr. Varanasi8/21/17 revealing a 90% segmental mid dominant RCA stenosis which was stented with a Synergy 3.5 mm x 20 mm long drug-eluting stent. He did have a very small ramus branch that was 90% stenosed and not suitable for intervention as well as a 70% mid LAD lesion although he had no evidence of anterior ischemia on his stress test. His symptoms of chest pain have resolved.  He was diagnosed in May 2019 with new  onset  afib from which the pt is asymptomatic. Echo was done and showed EF decreased to 30-35%, normal EF by echo in August 2017. He has been on Eliquis 5 mg bid since early May without missed doses.  Plavix were stopped at that time, ASA continued and pt was started on carvedilol 6.25 mg bid.He states that he had a sleep study in the past but never slept enough with the study to get good results. He is afraid that he would never be able to tolerate a cpap. Sleeps in a recliner now, " I just feel more comfortable".  F/u in afib clinic 10/03/17, unfortunately cardioversion only held for 1-2 days.He developed  exertional chest discomfort   and was seen by R. Barrett, PA and had a cardiac cath which pt did receive a stent in the LAD. He feels improved but still remains in afib.  He did have to hold anticoagulation for 1-2 days and was back on anticoagulation without interruption on 8/23.  We did discuss trying to restore SR with EF moderately reduced and he would like to purse  after his vacation at the end of the month.  F/u in afib clinic, 01/31/18. He is back in afib clinic to discuss Tikosyn admit.  He has recently seen Dr. Aundra Young and he also encouraged pt to consider Tiksoyn for his LV dysfunction.Marland Kitchen He is feeling better and was hoping he could get by without drug. He will commit but wants to wait until 10/1. He also reports that he has been having h/a's ever since he started Imdur. I discussed with Dillon Young, PharmD, and  Imdur was eventually stopped with resolution of H/A.  [name] is now back in afib clinic for tikosyn admit.  No misses eliquis doses and no benadryl use.PharmD has screened drugs and no qt prolonging drugs. He will be applying for drug assistance and papers received and reviewed.  F/u 10/28, s/p Tikosyn admit. He is in SR with 125 mcg of Tikosyn. He will be getting drug thru pharmacy  assistance program. He feels improved.  F/u Tikosyn 11/21, he continues in [place]. He has a sinus infection and is taking cortisone and antibiotic currently. qtc 463 ms. By Dillon Young he appears to have bigeminal PVC's but rhythm was regular when auscultated.  Today, he denies symptoms of palpitations, shortness of breath, orthopnea, PND, lower extremity edema, dizziness, presyncope, syncope, or neurologic sequela.  The patient is tolerating medications without difficulties and is otherwise without complaint today.   Past Medical History:  Diagnosis Date  . Adenomatous colon polyp   .  Allergic rhinitis   . BPH (benign prostatic hyperplasia)   . CAD (coronary artery disease)    sees Dr. Quay Young   . Carotid artery disease ([place])   . CHF (congestive heart failure) ([place])   . Chronic kidney disease    Sones. Prostate cancer  . Clotting disorder ([place])   . CVA (cerebral infarction)   . Diabetes mellitus    "Borderline"  . Diverticulitis   . GERD (gastroesophageal reflux disease)    history  . Hemorrhoids   . HTN (hypertension)   . Hyperlipidemia LDL goal <70   . IBS  (irritable bowel syndrome)   . Low back pain   . Prostate cancer ([place]) 2008   seed implantation  . Sleep apnea    last study was aborted.  Has never worn a CPAP.    Marland Kitchen Smoker    Past Surgical History:  Procedure Laterality Date  . APPENDECTOMY    . CARDIAC CATHETERIZATION N/A 01/16/2016   Procedure: Left Heart Cath and Coronary Angiography;  Surgeon: Dillon Booze, MD;  Location: [place] [facility];  Service: Cardiovascular;  Laterality: N/A;  . CARDIAC CATHETERIZATION N/A 01/16/2016   Procedure: Coronary Stent Intervention;  Surgeon: Dillon Booze, MD;  Location: [place] [facility];  Service: Cardiovascular;  Laterality: N/A;  . CARDIOVERSION N/A 11/26/2017   Procedure: CARDIOVERSION;  Surgeon: Dillon Casino, MD;  Location: [facility] ENDOSCOPY;  Service: Cardiovascular;  Laterality: N/A;  . CARDIOVERSION N/A 03/13/2018   Procedure: CARDIOVERSION;  Surgeon: Dillon Casino, MD;  Location: [facility] ENDOSCOPY;  Service: Cardiovascular;  Laterality: N/A;  . CATARACT EXTRACTION W/ INTRAOCULAR LENS  IMPLANT, BILATERAL Bilateral   . COLONOSCOPY  10/21/2008   diverticulosis, radiation proctitis, internal hemorrhoids  . CORONARY STENT INTERVENTION N/A 12/26/2017   Procedure: CORONARY STENT INTERVENTION;  Surgeon: Dillon Harp, MD;  Location: [place] [facility];  Service: Cardiovascular;  Laterality: N/A;  . ENDARTERECTOMY  11/12/2011   Procedure: ENDARTERECTOMY CAROTID;  Surgeon: Dillon Posner, MD;  Location: [facility] OR;  Service: Vascular;  Laterality: Right;  . ESOPHAGOGASTRODUODENOSCOPY (EGD) WITH ESOPHAGEAL DILATION    . HEMORRHOID BANDING    . LEFT HEART CATH AND CORONARY ANGIOGRAPHY N/A 12/26/2017   Procedure: LEFT HEART CATH AND CORONARY ANGIOGRAPHY;  Surgeon: Dillon Harp, MD;  Location: [place] [facility];  Service: Cardiovascular;  Laterality: N/A;  . LITHOTRIPSY  X 3  . LUMBAR LAMINECTOMY    . TRANSURETHRAL RESECTION OF PROSTATE  10/2003    Current Outpatient Medications  Medication  Sig Dispense Refill  . carvedilol (COREG) 6.25 MG tablet Take 1 tablet (6.25 mg total) by mouth 2 (two) times daily. 180 tablet 1  . cefdinir (OMNICEF) 300 MG capsule Take 1 capsule (300 mg total) by mouth 2 (two) times daily. 20 capsule 0  . chlorhexidine (PERIDEX) 0.12 % solution RINSE 2 TIMES A DAY IN THE MORNING AND BEFORE BEDTIME FOR 30 SECONDS, USE SYRINGE AND PROXYBRUSH  1  . cholecalciferol (VITAMIN D) 1000 UNITS tablet Take 1,000 Units by mouth at bedtime.     . clopidogrel (PLAVIX) 75 MG tablet Take 1 tablet (75 mg total) by mouth daily with breakfast. 90 tablet 3  . dofetilide (TIKOSYN) 125 MCG capsule Take 1 capsule (125 mcg total) by mouth 2 (two) times daily. 30 capsule 0  . ELIQUIS 5 MG TABS tablet TAKE 1 TABLET BY MOUTH TWICE A DAY 180 tablet 1  . hydrocortisone (ANUSOL-HC) 2.5 % rectal cream Place 1 application  rectally 2 (two) times daily as needed for hemorrhoids.     . Hypromellose (ARTIFICIAL TEARS OP) Place 1 drop into both eyes 4 (four) times daily as needed (dry eyes).    Marland Kitchen ipratropium (ATROVENT) 0.06 % nasal spray Place 2 sprays into both nostrils 4 (four) times daily. 15 mL 12  . Melatonin 10 MG TABS Take 10 mg by mouth at bedtime as needed (for sleep).     . nitroGLYCERIN (NITROSTAT) 0.4 MG SL tablet Place 1 tablet (0.4 mg total) under the tongue every 5 (five) minutes as needed for chest pain. 25 tablet 3  . predniSONE (DELTASONE) 20 MG tablet Take 1 tablet (20 mg total) by mouth daily with breakfast. 5 tablet 0  . rosuvastatin (CRESTOR) 10 MG tablet Take 10 mg by mouth daily.    . sacubitril-valsartan (ENTRESTO) 49-51 MG Take 1 tablet by mouth 2 (two) times daily. 60 tablet 11  . spironolactone (ALDACTONE) 25 MG tablet Take 1 tablet (25 mg total) by mouth daily. 30 tablet 5  . vitamin C (ASCORBIC ACID) 500 MG tablet Take 500 mg by mouth at bedtime.     . Wheat Dextrin (BENEFIBER PO) Take 2 Scoops by mouth daily.      No current facility-administered medications for  this encounter.     Allergies  Allergen Reactions  . Lipitor [Atorvastatin] Other (See Comments)    myalgias    Social History   Socioeconomic History  . Marital status: Married    Spouse name: Not on file  . Number of children: 2  . Years of education: Not on file  . Highest education level: Not on file  Occupational History  . 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 Financial resource strain: Not on file  . Food insecurity:    Worry: Not on file    Inability: Not on file  . Transportation needs:    Medical: Not on file    Non-medical: Not on file  Tobacco Use  . Smoking status: Former Smoker    Packs/day: 1.50    Years: 60.00    Pack years: 90.00    Types: Cigarettes    Last attempt to quit: 07/25/2008    Years since quitting: 9.7  . Smokeless tobacco: Never Used  Substance and Sexual Activity  . Alcohol use: Yes    Alcohol/week: 0.0 standard drinks    Comment: Occ  . Drug use: No  . Sexual activity: Not on file  Lifestyle  . Physical activity:    Days per week: Not on file    Minutes per session: Not on file  . Stress: Not on file  Relationships  . Social connections:    Talks on phone: Not on file    Gets together: Not on file    Attends religious service: Not on file    Active member of club or organization: Not on file    Attends meetings of clubs or organizations: Not on file    Relationship status: Not on file  . Intimate partner violence:    Fear of current or ex partner: Not on file    Emotionally abused: Not on file    Physically abused: Not on file    Forced sexual activity: Not on file  Other Topics Concern  . Not on file  Social History Narrative  . Not on file    Family History  Problem Relation Age of Onset  . Heart disease Father   . Hypertension Mother   .  Asthma Paternal Grandmother   . Colon cancer Neg Hx   . Esophageal cancer Neg Hx   . Stomach cancer Neg Hx   . Rectal cancer Neg Hx   . Liver cancer Neg Hx      ROS- All systems are reviewed and negative except as per the HPI above  Physical Exam: Vitals:   04/17/18 0903  BP: 136/76  Pulse: 68  Weight: 80.3 kg  Height: 5\' 9"  (1.753 m)   Wt Readings from Last 3 Encounters:  04/17/18 80.3 kg  04/16/18 80.8 kg  03/24/18 79.4 kg    Labs: Lab Results  Component Value Date   NA 140 03/24/2018   K 4.5 03/24/2018   CL 106 03/24/2018   CO2 28 03/24/2018   GLUCOSE 104 (H) 03/24/2018   BUN 18 03/24/2018   CREATININE 1.12 03/24/2018   CALCIUM 9.4 03/24/2018   MG 2.2 03/24/2018   Lab Results  Component Value Date   INR 1.1 12/18/2017   Lab Results  Component Value Date   CHOL 121 10/02/2017   HDL 46 10/02/2017   LDLCALC 50 10/02/2017   TRIG 127 10/02/2017     GEN- The patient is well appearing, alert and oriented x 3 today.   Head- normocephalic, atraumatic Eyes-  Sclera clear, conjunctiva pink Ears- hearing intact Oropharynx- clear Neck- supple, no JVP Lymph- no cervical lymphadenopathy Lungs- Clear to ausculation bilaterally, normal work of breathing Heart- regular rate and rhythm, no murmurs, rubs or gallops, PMI not laterally displaced GI- soft, NT, ND, + BS Extremities- no clubbing, cyanosis, or edema MS- no significant deformity or atrophy Skin- no rash or lesion Psych- euthymic mood, full affect Neuro- strength and sensation are intact  EKG-Sinus rhythm  with first degree AV block, biventricular Premature ventricular contractions,   68 bpm, pr int 216 ms,  qrs int 98 ms, qtc 463 ms Echo-Study Conclusions  - Left ventricle: The cavity size was severely dilated. Systolic   function was moderately to severely reduced. The estimated   ejection fraction was in the range of 30% to 35%. There is   hypokinesis of the lateral myocardium. There is akinesis of the   inferolateral and apical myocardium. The study was not   technically sufficient to allow evaluation of LV diastolic   dysfunction due to atrial  fibrillation. - Aorta: Maximal diameter of abdominal aorta: 41 mm. - Abdominal aorta: The abdominal aorta was moderately dilated. - Tricuspid valve: There was trivial regurgitation. - Pulmonic valve: There was trivial regurgitation.    Assessment and Plan: 1. New onset afib May 2019 Discussed pt that Tikosyn assistance program is ending and he will need to look into resources given to be able to afford generic dofetilide He is now in SR s/p tikosyn load Tikosyn precautions reviewed No missed doses of eliquis  qtc stable at  463 ms Bmet/mag drawn   2. LV dysfunction No unusual shortness of breath Currently weight is stable Continue BB/ARB/spironolactone  Per  Dr. Aundra Young   3. CAD Stable   F/u Dr. Gwenlyn Young as scheduled 12/11, Dr. Aundra Young 1/22  Geroge Baseman. Shalisha Clausing, [facility] [address] [place], [place] [address] [phone]

## 2018-04-18 ENCOUNTER — Other Ambulatory Visit: Payer: Self-pay | Admitting: Cardiovascular Disease

## 2018-05-02 ENCOUNTER — Telehealth: Payer: Self-pay

## 2018-05-02 NOTE — Telephone Encounter (Signed)
Left VM for Mr. Dillon Young to call back about participating in an upcoming new PREP class in Jan 2020.

## 2018-05-07 ENCOUNTER — Encounter: Payer: Self-pay | Admitting: Cardiovascular Disease

## 2018-05-07 ENCOUNTER — Ambulatory Visit (INDEPENDENT_AMBULATORY_CARE_PROVIDER_SITE_OTHER): Payer: PPO | Admitting: Cardiovascular Disease

## 2018-05-07 DIAGNOSIS — I4819 Other persistent atrial fibrillation: Secondary | ICD-10-CM | POA: Diagnosis not present

## 2018-05-07 DIAGNOSIS — I519 Heart disease, unspecified: Secondary | ICD-10-CM | POA: Diagnosis not present

## 2018-05-07 DIAGNOSIS — I10 Essential (primary) hypertension: Secondary | ICD-10-CM

## 2018-05-07 DIAGNOSIS — E785 Hyperlipidemia, unspecified: Secondary | ICD-10-CM | POA: Diagnosis not present

## 2018-05-07 DIAGNOSIS — Z9861 Coronary angioplasty status: Secondary | ICD-10-CM

## 2018-05-07 DIAGNOSIS — I2510 Atherosclerotic heart disease of native coronary artery without angina pectoris: Secondary | ICD-10-CM

## 2018-05-07 NOTE — Assessment & Plan Note (Signed)
History of CAD status post multiple PCI's in the past and 1994 1995 the circumflex, and recent LAD stent which I performed 12/26/2017.  His prior RCA stent was widely patent.  He did have an EF in the 25% range at that time.

## 2018-05-07 NOTE — Progress Notes (Signed)
05/07/2018 Dillon Young   1933/02/16  [id number]  Primary Physician Dillon Lima, MD Primary Cardiologist: Dillon Harp MD Dillon Young, [name]  HPI:  Dillon Young is a 84 y.o.  married Caucasian male father of 2 children, grandfather to 2 grandchildren he was formally a patient of Dr. Delfino Lovett Lowella Young. I last saw him in the office 11/06/2017. 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 He works out 5 days a week and has joined [employer].He has a history of coronary artery disease status post angioplasty in 1994 and 1995 Of his circumflex coronary artery. He has not required cardiac catheterization since stress Myoview was performed in 2011 that was low risk and nonischemic. He has a history of hypertension and hyperlipidemia on medication. He had right carotid endarterectomy performed by Dr. Sherren Young Early June 2013 followed by duplex ultrasound. He denies chest pain or shortness of breath. His major complaint is of cramping in his thighs which may be related to his statin drug. He was given a statin holiday which really did not impact his symptoms and therefore statin was restarted and follow-up lipid profile performed 07/05/14 revealed a total cholesterol 131, LDL 58 and HDL of 44. Recent carotid Dopplers revealed his endarterectomy site was widely patent with moderate left ICA stenosis and moderate left subclavian artery stenosis without symptoms. He developed symptoms in August of this year and saw Dillon Young. A Myoview stress test performed 01/09/16 was notable for ischemia in the RCA distribution. Based on this he underwent cardiac catheterization by Dr. Varanasi8/21/17 revealing a 90% segmental mid dominant RCA stenosis which was stented with a Synergy 3.5 mm x 20 mm long drug-eluting stent. He did have a very small ramus branch that was 90% stenosed and not suitable for intervention as well as a 70% mid LAD  lesion although he had no evidence of anterior ischemia on his stress test. His symptoms of chest pain have resolved. Since I saw himthis months ago he is remained stable. He denies chest pain or shortness of breath. Does have mild ankle edema. He is in newly recognized atrial fibrillation today. He does relate a recent episode of hematuria which lasted for 10 days thought to be related related to his prostate and remote prostate cancer status post radioactive seed implantation. He did see Dr. Jeffie Young, his urologist for evaluation and stopped his aspirin briefly with resolution of his hematuria. He has restarted dual anti-platelet therapy.  Since I saw him in the office a month ago he has had a 2D echo that revealed decline in his LV function from 50 to 55% by his last 2D echo 2 years ago to 30 to 35%. This may be related to his atrial fibrillation. He feels weak and fatigued with some shortness of breath as well.  Because of relatively new onset chest pain he saw Dillon Young in the office he began him on oral nitrate.  His pain improved but he was referred for diagnostic coronary angiography to define his anatomy.  I performed cardiac catheterization on him 12/26/2017 revealing a patent RCA stent with high-grade mid LAD lesion which underwent PCI and drug-eluting stenting.  He was also found at that time by a left cheek log to have an EF of 25 to 30%.  He was in A. fib.  He subsequently undergone pharmacologic cardioversion with Tikosyn.  He is on optimal medical therapy for his LV dysfunction  and has seen Dr. Marlyce Young in the advanced heart failure clinic.  Current Meds  Medication Sig  . carvedilol (COREG) 6.25 MG tablet TAKE 1 TABLET BY MOUTH TWICE A DAY  . cefdinir (OMNICEF) 300 MG capsule Take 1 capsule (300 mg total) by mouth 2 (two) times daily.  . chlorhexidine (PERIDEX) 0.12 % solution RINSE 2 TIMES A DAY IN THE MORNING AND BEFORE BEDTIME FOR 30 SECONDS, USE SYRINGE AND  PROXYBRUSH  . cholecalciferol (VITAMIN D) 1000 UNITS tablet Take 1,000 Units by mouth at bedtime.   . clopidogrel (PLAVIX) 75 MG tablet Take 1 tablet (75 mg total) by mouth daily with breakfast.  . dofetilide (TIKOSYN) 125 MCG capsule Take 1 capsule (125 mcg total) by mouth 2 (two) times daily.  Marland Kitchen ELIQUIS 5 MG TABS tablet TAKE 1 TABLET BY MOUTH TWICE A DAY  . hydrocortisone (ANUSOL-HC) 2.5 % rectal cream Place 1 application rectally 2 (two) times daily as needed for hemorrhoids.   . Hypromellose (ARTIFICIAL TEARS OP) Place 1 drop into both eyes 4 (four) times daily as needed (dry eyes).  Marland Kitchen ipratropium (ATROVENT) 0.06 % nasal spray Place 2 sprays into both nostrils 4 (four) times daily.  . Melatonin 10 MG TABS Take 10 mg by mouth at bedtime as needed (for sleep).   . nitroGLYCERIN (NITROSTAT) 0.4 MG SL tablet Place 1 tablet (0.4 mg total) under the tongue every 5 (five) minutes as needed for chest pain.  . predniSONE (DELTASONE) 20 MG tablet Take 1 tablet (20 mg total) by mouth daily with breakfast.  . rosuvastatin (CRESTOR) 10 MG tablet Take 10 mg by mouth daily.  . sacubitril-valsartan (ENTRESTO) 49-51 MG Take 1 tablet by mouth 2 (two) times daily.  Marland Kitchen spironolactone (ALDACTONE) 25 MG tablet Take 1 tablet (25 mg total) by mouth daily.  . vitamin C (ASCORBIC ACID) 500 MG tablet Take 500 mg by mouth at bedtime.   . Wheat Dextrin (BENEFIBER PO) Take 2 Scoops by mouth daily.      Allergies  Allergen Reactions  . Lipitor [Atorvastatin] Other (See Comments)    myalgias    Social History   Socioeconomic History  . Marital status: Married    Spouse name: Not on file  . Number of children: 2  . Years of education: Not on file  . Highest education level: Not on file  Occupational History  . 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 Financial resource strain: Not on file  . Food insecurity:    Worry: Not on file    Inability: Not on file  . Transportation needs:    Medical:  Not on file    Non-medical: Not on file  Tobacco Use  . Smoking status: Former Smoker    Packs/day: 1.50    Years: 60.00    Pack years: 90.00    Types: Cigarettes    Last attempt to quit: 07/25/2008    Years since quitting: 9.7  . Smokeless tobacco: Never Used  Substance and Sexual Activity  . Alcohol use: Yes    Alcohol/week: 0.0 standard drinks    Comment: Occ  . Drug use: No  . Sexual activity: Not on file  Lifestyle  . Physical activity:    Days per week: Not on file    Minutes per session: Not on file  . Stress: Not on file  Relationships  . Social connections:    Talks on phone: Not on file    Gets together: Not on file  Attends religious service: Not on file    Active member of club or organization: Not on file    Attends meetings of clubs or organizations: Not on file    Relationship status: Not on file  . Intimate partner violence:    Fear of current or ex partner: Not on file    Emotionally abused: Not on file    Physically abused: Not on file    Forced sexual activity: Not on file  Other Topics Concern  . Not on file  Social History Narrative  . Not on file     Review of Systems: General: negative for chills, fever, night sweats or weight changes.  Cardiovascular: negative for chest pain, dyspnea on exertion, edema, orthopnea, palpitations, paroxysmal nocturnal dyspnea or shortness of breath Dermatological: negative for rash Respiratory: negative for cough or wheezing Urologic: negative for hematuria Abdominal: negative for nausea, vomiting, diarrhea, bright red blood per rectum, melena, or hematemesis Neurologic: negative for visual changes, syncope, or dizziness All other systems reviewed and are otherwise negative except as noted above.    Blood pressure (!) 138/56, pulse (!) 57, height 5\' 9"  (1.753 m), weight 178 lb 12.8 oz (81.1 kg).  General appearance: alert and no distress Neck: no adenopathy, no carotid bruit, no JVD, supple, symmetrical,  trachea midline and thyroid not enlarged, symmetric, no tenderness/mass/nodules Lungs: clear to auscultation bilaterally Heart: regular rate and rhythm, S1, S2 normal, no murmur, click, rub or gallop Extremities: extremities normal, atraumatic, no cyanosis or edema Pulses: 2+ and symmetric Skin: Skin color, texture, turgor normal. No rashes or lesions Neurologic: Alert and oriented X 3, normal strength and tone. Normal symmetric reflexes. Normal coordination and gait  EKG not performed today  ASSESSMENT AND PLAN:   Hyperlipidemia with target LDL less than 70 History of hyperlipidemia on Crestor with recent lipid profile performed 10/02/2017 revealing total cholesterol 121, LDL of 50 and HDL 46.  Essential hypertension History of essential hypertension with blood pressure measured today at 138/56.  He is on carvedilol, Aldactone and Entresto.  CAD S/P multiple PCIs History of CAD status post multiple PCI's in the past and 1994 1995 the circumflex, and recent LAD stent which I performed 12/26/2017.  His prior RCA stent was widely patent.  He did have an EF in the 25% range at that time.  Persistent atrial fibrillation History of persistent A. fib on Eliquis oral anticoagulation now in sinus rhythm after Tikosyn load.  He is followed by the A. fib clinic.  Left ventricular dysfunction History of nonischemic cardiomyopathy with an EF in the 25 to 30% range on optimal pharmacology.  He denies symptoms of heart failure is aware of salt restriction.  I am going to get a repeat echo on him now that he is in sinus rhythm and on optimal medical therapy next month.      Dillon Harp MD FACP,FACC,FAHA, [facility] 05/07/2018 10:03 AM

## 2018-05-07 NOTE — Assessment & Plan Note (Signed)
History of essential hypertension with blood pressure measured today at 138/56.  He is on carvedilol, Aldactone and Entresto.

## 2018-05-07 NOTE — Assessment & Plan Note (Signed)
History of persistent A. fib on Eliquis oral anticoagulation now in sinus rhythm after Tikosyn load.  He is followed by the A. fib clinic.

## 2018-05-07 NOTE — Patient Instructions (Addendum)
Medication Instructions:  Your physician recommends that you continue on your current medications as directed. Please refer to the Current Medication list given to you today.  If you need a refill on your cardiac medications before your next appointment, please call your pharmacy.   Lab work: NONE If you have labs (blood work) drawn today and your tests are completely normal, you will receive your results only by: Marland Kitchen MyChart Message (if you have MyChart) OR . A paper copy in the mail If you have any lab test that is abnormal or we need to change your treatment, we will call you to review the results.  Testing/Procedures: Your physician has requested that you have an echocardiogram. Echocardiography is a painless test that uses sound waves to create images of your heart. It provides your doctor with information about the size and shape of your heart and how well your heart's chambers and valves are working. This procedure takes approximately one hour. There are no restrictions for this procedure.  [place], MD FOR June 18 2018  Follow-Up: At [facility], you and your health needs are our priority.  As part of our continuing mission to provide you with exceptional heart care, we have created designated Provider Care Teams.  These Care Teams include your primary Cardiologist (physician) and Advanced Practice Providers (APPs -  Physician Assistants and Nurse Practitioners) who all work together to provide you with the care you need, when you need it. You will need a follow up appointment in 6 months.  Please call our office 2 months in advance to schedule this appointment.  You may see DR. BERRY or one of the following Advanced Practice Providers on your designated Care Team:   Kerin Ransom, PA-C [place], [place] . Sande Rives, PA-C

## 2018-05-07 NOTE — Assessment & Plan Note (Signed)
History of nonischemic cardiomyopathy with an EF in the 25 to 30% range on optimal pharmacology.  He denies symptoms of heart failure is aware of salt restriction.  I am going to get a repeat echo on him now that he is in sinus rhythm and on optimal medical therapy next month.

## 2018-05-07 NOTE — Assessment & Plan Note (Signed)
History of hyperlipidemia on Crestor with recent lipid profile performed 10/02/2017 revealing total cholesterol 121, LDL of 50 and HDL 46.

## 2018-05-26 ENCOUNTER — Other Ambulatory Visit: Payer: Self-pay

## 2018-05-26 ENCOUNTER — Other Ambulatory Visit: Payer: Self-pay | Admitting: Pharmacy Technician

## 2018-05-26 NOTE — Patient Outreach (Signed)
[facility]) Care Management  05/26/2018  Dillon Young 1933-04-14 [id number]                                                  Medication Assistance Referral  Referral From: [place]  Medication/Company: [name] / [employer] Patient application portion:  Mailed Provider application portion: Faxed  to Dr. Harl Bowie   Follow up:  Will follow up with patient in 5-7 business days to confirm application(s) have been received.  [name] [name] [name] Certified Pharmacy Technician [place] Management Direct [phone]

## 2018-05-26 NOTE — Patient Outreach (Signed)
[facility]) Care Management  [place]   05/26/2018  Dillon Young 28-Jul-1932 [id number]  Reason for referral: medication assitance  Referral medication(s):Entresto, Eliquis Current insurance:HTA  PMHx: hypertension, CAP s/p multiple PCIs, cerebrovascular disease, atrial fibrillation, GERD, diabetes mellitus and hyperlipidemia  HPI:  Mr. Dillon Young states he is having trouble affording this Entresto and Eliquis.  Objective: Allergies  Allergen Reactions  . Lipitor [Atorvastatin] Other (See Comments)    myalgias    Medications Reviewed Today    Reviewed by Dillon Young, [facility] (Pharmacist) on 05/26/18 at 1234  Med List Status: <None>  Medication Order Taking? Sig Documenting Provider Last Dose Status Informant  carvedilol (COREG) 6.25 MG tablet [id number] Yes TAKE 1 TABLET BY MOUTH TWICE A DAY [name], MD Taking Active   chlorhexidine (PERIDEX) 0.12 % solution [id number] Yes RINSE 2 TIMES A DAY IN THE MORNING AND BEFORE BEDTIME FOR 30 SECONDS, USE SYRINGE AND PROXYBRUSH [provider] Taking Active   cholecalciferol (VITAMIN D) 1000 UNITS tablet [id number] Yes Take 1,000 Units by mouth at bedtime.  [provider] Taking Active Self  clopidogrel (PLAVIX) 75 MG tablet [id number] Yes Take 1 tablet (75 mg total) by mouth daily with breakfast. Leanor Kail, PA Taking Active Self  dofetilide (TIKOSYN) 125 MCG capsule [id number] Yes Take 1 capsule (125 mcg total) by mouth 2 (two) times daily. Lyda Jester M, PA-C Taking Active   ELIQUIS 5 MG TABS tablet [id number] Yes TAKE 1 TABLET BY MOUTH TWICE A DAY [name], MD Taking Active   hydrocortisone (ANUSOL-HC) 2.5 % rectal cream [id number] No Place 1 application rectally 2 (two) times daily as needed for hemorrhoids.  [provider] Not Taking Active Self  Hypromellose (ARTIFICIAL TEARS OP) [id number] Yes Place 1 drop into both eyes 4 (four) times daily as  needed (dry eyes). [provider] Taking Active Self  ipratropium (ATROVENT) 0.06 % nasal spray [id number] Yes Place 2 sprays into both nostrils 4 (four) times daily. Marrian Salvage, FNP Taking Active   Melatonin 10 MG TABS [id number] Yes Take 10 mg by mouth at bedtime as needed (for sleep).  [provider] Taking Active Self  nitroGLYCERIN (NITROSTAT) 0.4 MG SL tablet [id number] Yes Place 1 tablet (0.4 mg total) under the tongue every 5 (five) minutes as needed for chest pain. Barrett, Evelene Croon, PA-C Taking Active Self  rosuvastatin (CRESTOR) 10 MG tablet [id number] Yes Take 10 mg by mouth daily. [provider] Taking Active Self  sacubitril-valsartan (ENTRESTO) 49-51 MG [id number] Yes Take 1 tablet by mouth 2 (two) times daily. Larey Dresser, MD Taking Active Self        Discontinued 05/26/18 1234 (Completed Course)   spironolactone (ALDACTONE) 25 MG tablet [id number] Yes Take 25 mg by mouth daily. [provider]  Active Self  vitamin C (ASCORBIC ACID) 500 MG tablet [id number] Yes Take 500 mg by mouth at bedtime.  [provider] Taking Active Self  Wheat Dextrin (BENEFIBER PO) [id number] Yes Take 2 Scoops by mouth daily.  [provider] Taking Active Self          Assessment:  Drugs sorted by system:  Neurologic/Psychologic:  Cardiovascular: carvedilol, clopidogrel, dofetilide, apixaban, nitroglycerin, rosuvastatin, sacubitril/valsartan, spironolactone,   Pulmonary/Allergy: ipratropium  Topical: chlorhexidine, hydrocortisone rectal, artifical tears  Vitamins/Minerals/Supplements: cholecalciferol, vitamin C, wheat dextran  Miscellaneous: melatonin  Medication Review Findings:  . The risk of hyperkalemia may be increased when potassium-sparing diuretics are coadministerd with angiotension II recepetor  antgaonist. . The use apixaban and clopidogrel may increase the risk of bleeding.   Medication Assistance Findings:   Medication assistance needs identified.   Extra Help:   '[]'  Already receiving Full Extra Help  '[]'  Already receiving Partial Extra Help  '[]'  Eligible based on reported income and assets  '[x]'  Not Eligible based on reported income and assets  Patient Assistance Programs: 1) Entresto made by [employer] o Income requirement met: '[x]'  Yes '[]'  No '[]'  Unknown o Out-of-pocket prescription expenditure met:    '[]'  Yes '[]'  No  '[]'  Unknown  '[x]'  Not applicable Patient has met application requirements to apply for this patient assistance program.          2)  Eliquis made by [employer] o Income requirement met: '[x]'  Yes '[]'  No  '[]'  Unknown o Out-of-pocket prescription expenditure met:   '[]'  Yes '[x]'  No   '[]'  Unknown '[]'  Not applicable     Patient has not met application requirements to apply for the patient assistance program at this time. He will need to spend 3% of                                      household income ~$1200.  He is aware that he should call me when he has spent ~$1000 and we will send him the apixaban patient assistance                         application.    Additional medication assistance options reviewed with patient as warranted:  Patient to use Rx Outreach for his Tikosyn prescription.  Plan: I will route patient assistance letter to [place] technician who will coordinate patient assistance program application process for medications listed above.  [facility] pharmacy technician will assist with obtaining all required documents from both patient and provider(s) and submit application(s) once completed.   Dillon Young, PharmD Clinical Pharmacist [place] [phone]

## 2018-06-02 ENCOUNTER — Other Ambulatory Visit: Payer: Self-pay | Admitting: Internal Medicine

## 2018-06-02 NOTE — Telephone Encounter (Signed)
Copied from [place] [id number]. Topic: Quick Communication - Rx Refill/Question >> Jun 02, 2018 10:54 AM Blase Mess A wrote: Medication:ipratropium (ATROVENT) 0.06 % nasal spray [[id number]]   Has the patient contacted their pharmacy? Yes  (Agent: If no, request that the patient contact the pharmacy for the refill.) (Agent: If yes, when and what did the pharmacy advise?)  Preferred Pharmacy (with phone number or street name):[id number] [place], [place] [phone] (Phone) [phone] (Fax)    Agent: Please be advised that RX refills may take up to 3 business days. We ask that you follow-up with your pharmacy.

## 2018-06-03 NOTE — Telephone Encounter (Signed)
Requested medication (s) are due for refill today: yes  Requested medication (s) are on the active medication list: yes  Last refill:  04/16/18 for 12 refills  Future visit scheduled: no  Notes to clinic:  Med failed, off protocol.  Requested Prescriptions  Pending Prescriptions Disp Refills   ipratropium (ATROVENT) 0.06 % nasal spray 15 mL 12    Sig: Place 2 sprays into both nostrils 4 (four) times daily.     Off-Protocol Failed - 06/03/2018  9:39 AM      Failed - Medication not assigned to a protocol, review manually.      Passed - Valid encounter within last 12 months    Recent Outpatient Visits          1 month ago Acute sinusitis, recurrence not specified, unspecified location   [place], Marvis Repress, FNP   3 months ago Acute post-traumatic headache, not intractable   [place], Marvis Repress, FNP   9 months ago Cough   [facility] Camel, MD   1 year ago Cough   [facility] Roni Bread, Algis Greenhouse, MD   1 year ago Bronchitis   [facility] [address] Algis Greenhouse, MD

## 2018-06-03 NOTE — Telephone Encounter (Signed)
Erx refused. Pt has not seen PCP since March.  Pt will need to be seen.

## 2018-06-04 ENCOUNTER — Ambulatory Visit: Payer: Self-pay | Admitting: Pharmacy Technician

## 2018-06-04 NOTE — Progress Notes (Signed)
[facility] Report   Patient Details  Name: Dillon Young MRN: [id number] Date of Birth: [birth year] Age: 85 y.o. PCP: [name], MD  Vitals:   06/04/18 1615  BP: 140/68  Pulse: (!) 58  Resp: 18  SpO2: 98%  Weight: 179 lb (81.2 kg)      Past Medical History:  Diagnosis Date  . Adenomatous colon polyp   . Allergic rhinitis   . BPH (benign prostatic hyperplasia)   . CAD (coronary artery disease)    sees Dr. [name]   . Carotid artery disease ([place])   . CHF (congestive heart failure) ([place])   . Chronic kidney disease    [place]. Prostate cancer  . Clotting disorder ([place])   . CVA (cerebral infarction)   . Diabetes mellitus    "Borderline"  . Diverticulitis   . GERD (gastroesophageal reflux disease)    history  . Hemorrhoids   . HTN (hypertension)   . Hyperlipidemia LDL goal <70   . IBS (irritable bowel syndrome)   . Low back pain   . Prostate cancer ([place]) 2008   seed implantation  . Sleep apnea    last study was aborted.  Has never worn a CPAP.    Marland Kitchen Smoker    Past Surgical History:  Procedure Laterality Date  . APPENDECTOMY    . CARDIAC CATHETERIZATION N/A 01/16/2016   Procedure: Left Heart Cath and Coronary Angiography;  Surgeon: Jettie Booze, MD;  Location: [place] [facility];  Service: Cardiovascular;  Laterality: N/A;  . CARDIAC CATHETERIZATION N/A 01/16/2016   Procedure: Coronary Stent Intervention;  Surgeon: Jettie Booze, MD;  Location: [place];  Service: Cardiovascular;  Laterality: N/A;  . CARDIOVERSION N/A 11/26/2017   Procedure: CARDIOVERSION;  Surgeon: [name], MD;  Location: [facility] ENDOSCOPY;  Service: Cardiovascular;  Laterality: N/A;  . CARDIOVERSION N/A 03/13/2018   Procedure: CARDIOVERSION;  Surgeon: [name], MD;  Location: [facility] ENDOSCOPY;  Service: Cardiovascular;  Laterality: N/A;  . CATARACT EXTRACTION W/ INTRAOCULAR LENS  IMPLANT, BILATERAL Bilateral   . COLONOSCOPY  10/21/2008   diverticulosis, radiation proctitis, internal hemorrhoids  . CORONARY STENT INTERVENTION N/A 12/26/2017   Procedure: CORONARY STENT INTERVENTION;  Surgeon: [name], MD;  Location: [place] [facility];  Service: Cardiovascular;  Laterality: N/A;  . ENDARTERECTOMY  11/12/2011   Procedure: ENDARTERECTOMY CAROTID;  Surgeon: Rosetta Posner, MD;  Location: [facility] OR;  Service: Vascular;  Laterality: Right;  . ESOPHAGOGASTRODUODENOSCOPY (EGD) WITH ESOPHAGEAL DILATION    . HEMORRHOID BANDING    . LEFT HEART CATH AND CORONARY ANGIOGRAPHY N/A 12/26/2017   Procedure: LEFT HEART CATH AND CORONARY ANGIOGRAPHY;  Surgeon: [name], MD;  Location: [place] [facility];  Service: Cardiovascular;  Laterality: N/A;  . LITHOTRIPSY  X 3  . LUMBAR LAMINECTOMY    . TRANSURETHRAL RESECTION OF PROSTATE  10/2003   Social History   Tobacco Use  Smoking Status Former Smoker  . Packs/day: 1.50  . Years: 60.00  . Pack years: 90.00  . Types: Cigarettes  . Last attempt to quit: 07/25/2008  . Years since quitting: 9.8  Smokeless Tobacco Never Used     Dillon Young is registered for the 12-week, twice weekly PREP at the [facility] w/his wife, that began today.     Vanita Ingles 06/04/2018, 4:19 PM

## 2018-06-06 ENCOUNTER — Ambulatory Visit: Payer: Self-pay | Admitting: Pharmacy Technician

## 2018-06-09 ENCOUNTER — Ambulatory Visit: Payer: Self-pay | Admitting: Pharmacy Technician

## 2018-06-13 ENCOUNTER — Encounter: Payer: Self-pay | Admitting: Family Medicine

## 2018-06-13 ENCOUNTER — Ambulatory Visit (INDEPENDENT_AMBULATORY_CARE_PROVIDER_SITE_OTHER): Payer: PPO | Admitting: Family Medicine

## 2018-06-13 VITALS — BP 116/58 | HR 36 | Temp 97.3°F | Ht 69.0 in | Wt 180.0 lb

## 2018-06-13 DIAGNOSIS — R51 Headache: Secondary | ICD-10-CM | POA: Diagnosis not present

## 2018-06-13 DIAGNOSIS — G8929 Other chronic pain: Secondary | ICD-10-CM | POA: Insufficient documentation

## 2018-06-13 DIAGNOSIS — R519 Headache, unspecified: Secondary | ICD-10-CM | POA: Insufficient documentation

## 2018-06-13 DIAGNOSIS — R001 Bradycardia, unspecified: Secondary | ICD-10-CM | POA: Diagnosis not present

## 2018-06-13 MED ORDER — FLUTICASONE PROPIONATE 50 MCG/ACT NA SUSP: 2.0000 | Freq: Every day | NASAL | 1 refills | Status: DC

## 2018-06-13 NOTE — Assessment & Plan Note (Addendum)
-  He has bradycardia on exam today, and symptoms of headache and dizziness may be caused by this. -Declines EKG today, states he will see cardiology next week  -He will decrease carvedilol until seen by cardiology next week -He may continue flonase and tylenol as needed -Instructed to avoid NSAIDS -Discussed red flags.

## 2018-06-13 NOTE — Patient Instructions (Signed)
-  Take 1/2 of your carvedilol until you see your cardiologist.  -You may continue flonase -Tylenol and/or plain mucinex should be ok for you to use -Avoid medications such as ibuprofen or aleve as these can have an interaction with your eliquis  **If your headache worsens or you develop any neurological symptoms such as weakness, numbness, vision changes please seek emergency care/call 911.**

## 2018-06-13 NOTE — Progress Notes (Signed)
Dillon Young - 85 y.o. male MRN [id number]  Date of birth: 1932/07/01  Subjective Chief Complaint  Patient presents with  . Headache    has been ongoing for two weeks-and a constant runny nose. Denies fevers or body aches. Has been taking Tylenol and flonase with no improvement.     HPI Dillon Young is a 85 y.o. male here today with complaint of headache and congestion with rhinorrhea.  He reports that symptoms started about 2 weeks ago.  So far he has tried Human resources officer, tylenol, flonase and started of cefdinir that he was prescribed last month.  He has not had a whole lot of improvement with anything so far.  He also reports some associated dizzy feeling and fatigue/sluggish feeling.  He denies chest pain, shortness of breath, palpitations, fever, chills, nausea or vomiting.  ROS:  A comprehensive ROS was completed and negative except as noted per HPI   Allergies  Allergen Reactions  . Lipitor [Atorvastatin] Other (See Comments)    myalgias    Past Medical History:  Diagnosis Date  . Adenomatous colon polyp   . Allergic rhinitis   . BPH (benign prostatic hyperplasia)   . CAD (coronary artery disease)    sees Dr. [name]   . Carotid artery disease ([place])   . CHF (congestive heart failure) ([place])   . Chronic kidney disease    [place]. Prostate cancer  . Clotting disorder ([place])   . CVA (cerebral infarction)   . Diabetes mellitus    "Borderline"  . Diverticulitis   . GERD (gastroesophageal reflux disease)    history  . Hemorrhoids   . HTN (hypertension)   . Hyperlipidemia LDL goal <70   . IBS (irritable bowel syndrome)   . Low back pain   . Prostate cancer ([place]) 2008   seed implantation  . Sleep apnea    last study was aborted.  Has never worn a CPAP.    Marland Kitchen Smoker     Past Surgical History:  Procedure Laterality Date  . APPENDECTOMY    . CARDIAC CATHETERIZATION N/A 01/16/2016   Procedure: Left Heart Cath and Coronary Angiography;  Surgeon: Jettie Booze,  MD;  Location: [place];  Service: Cardiovascular;  Laterality: N/A;  . CARDIAC CATHETERIZATION N/A 01/16/2016   Procedure: Coronary Stent Intervention;  Surgeon: Jettie Booze, MD;  Location: [place] [facility];  Service: Cardiovascular;  Laterality: N/A;  . CARDIOVERSION N/A 11/26/2017   Procedure: CARDIOVERSION;  Surgeon: [name], MD;  Location: [facility] END[facility];  Service: Cardiovascular;  Laterality: N/A;  . CARDIOVERSION N/A 03/13/2018   Procedure: CARDIOVERSION;  Surgeon: [name], MD;  Location: [facility] ENDOSCOPY;  Service: Cardiovascular;  Laterality: N/A;  . CATARACT EXTRACTION W/ INTRAOCULAR LENS  IMPLANT, BILATERAL Bilateral   . COLONOSCOPY  10/21/2008   diverticulosis, radiation proctitis, internal hemorrhoids  . CORONARY STENT INTERVENTION N/A 12/26/2017   Procedure: CORONARY STENT INTERVENTION;  Surgeon: [name], MD;  Location: [place] [facility];  Service: Cardiovascular;  Laterality: N/A;  . ENDARTERECTOMY  11/12/2011   Procedure: ENDARTERECTOMY CAROTID;  Surgeon: Rosetta Posner, MD;  Location: [facility] OR;  Service: Vascular;  Laterality: Right;  . ESOPHAGOGASTRODUODENOSCOPY (EGD) WITH ESOPHAGEAL DILATION    . HEMORRHOID BANDING    . LEFT HEART CATH AND CORONARY ANGIOGRAPHY N/A 12/26/2017   Procedure: LEFT HEART CATH AND CORONARY ANGIOGRAPHY;  Surgeon: [name], MD;  Location: [place] [facility];  Service: Cardiovascular;  Laterality: N/A;  .  LITHOTRIPSY  X 3  . LUMBAR LAMINECTOMY    . TRANSURETHRAL RESECTION OF PROSTATE  10/2003    Social History   Socioeconomic History  . Marital status: Married    Spouse name: Not on file  . Number of children: 2  . Years of education: Not on file  . Highest education level: Not on file  Occupational History  . 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 Financial resource strain: Not on file  . Food insecurity:    Worry: Not on file    Inability: Not on file  . Transportation needs:     Medical: Not on file    Non-medical: Not on file  Tobacco Use  . Smoking status: Former Smoker    Packs/day: 1.50    Years: 60.00    Pack years: 90.00    Types: Cigarettes    Last attempt to quit: 07/25/2008    Years since quitting: 9.8  . Smokeless tobacco: Never Used  Substance and Sexual Activity  . Alcohol use: Yes    Alcohol/week: 0.0 standard drinks    Comment: Occ  . Drug use: No  . Sexual activity: Not on file  Lifestyle  . Physical activity:    Days per week: Not on file    Minutes per session: Not on file  . Stress: Not on file  Relationships  . Social connections:    Talks on phone: Not on file    Gets together: Not on file    Attends religious service: Not on file    Active member of club or organization: Not on file    Attends meetings of clubs or organizations: Not on file    Relationship status: Not on file  Other Topics Concern  . Not on file  Social History Narrative  . Not on file    Family History  Problem Relation Age of Onset  . Heart disease Father   . Hypertension Mother   . Asthma Paternal Grandmother   . Colon cancer Neg Hx   . Esophageal cancer Neg Hx   . Stomach cancer Neg Hx   . Rectal cancer Neg Hx   . Liver cancer Neg Hx     Health Maintenance  Topic Date Due  . OPHTHALMOLOGY EXAM  05/30/2016  . URINE MICROALBUMIN  08/14/2016  . FOOT EXAM  04/11/2017  . TETANUS/TDAP  05/06/2022  . INFLUENZA VACCINE  Completed  . PNA vac Low Risk Adult  Completed    ----------------------------------------------------------------------------------------------------------------------------------------------------------------------------------------------------------------- Physical Exam BP (!) 116/58   Pulse (!) 36   Temp (!) 97.3 F (36.3 C) (Oral)   Ht 5\' 9"  (1.753 m)   Wt 180 lb (81.6 kg)   SpO2 97%   BMI 26.58 kg/m   Physical Exam Constitutional:      Appearance: He is well-developed.  HENT:     Head: Normocephalic and  atraumatic.     Right Ear: Tympanic membrane normal.     Left Ear: Tympanic membrane normal.     Nose: Rhinorrhea present.     Mouth/Throat:     Mouth: Mucous membranes are moist.  Eyes:     General: No scleral icterus. Cardiovascular:     Rate and Rhythm: Bradycardia present.  Pulmonary:     Effort: Pulmonary effort is normal.     Breath sounds: Normal breath sounds.  Skin:    General: Skin is warm and dry.     Findings: No rash.  Neurological:  General: No focal deficit present.     Mental Status: He is oriented to person, place, and time.     Cranial Nerves: No cranial nerve deficit.     Motor: No weakness.     Coordination: Coordination normal.     Gait: Gait normal.  Psychiatric:        Mood and Affect: Mood normal.        Behavior: Behavior normal.     ------------------------------------------------------------------------------------------------------------------------------------------------------------------------------------------------------------------- Assessment and Plan  Headache -He has bradycardia on exam today, and symptoms of headache and dizziness may be caused by this. -Declines EKG today, states he will see cardiology next week  -He will decrease carvedilol until seen by cardiology next week -He may continue flonase and tylenol as needed -Instructed to avoid NSAIDS -Discussed red flags.

## 2018-06-17 ENCOUNTER — Other Ambulatory Visit: Payer: Self-pay | Admitting: Pharmacy Technician

## 2018-06-17 NOTE — Patient Outreach (Signed)
[place] [facility]) Care Management  06/17/2018  Dillon Young May 25, 1933 [id number]    Successful call placed to patient regarding patient assistance application(s) for [facility] , HIPAA identifiers verified. Mr. Sirico confirms that he received the application and questioned if there was an OOP spend requirement. Informed him that this program does not have that requirement. He stated that he return application as soon as he can.  Will follow up with patient in 14-21 business days if application has not been received back.  [name] [name] [place] Certified Pharmacy Technician [place] Management Direct Dial:[phone]

## 2018-06-18 ENCOUNTER — Encounter (HOSPITAL_COMMUNITY): Payer: PPO | Admitting: Cardiology

## 2018-06-18 ENCOUNTER — Ambulatory Visit (HOSPITAL_COMMUNITY)
Admission: RE | Admit: 2018-06-18 | Discharge: 2018-06-18 | Disposition: A | Discharge status: Home / Self Care | Payer: PPO | Source: Ambulatory Visit | Attending: Internal Medicine | Admitting: Internal Medicine

## 2018-06-18 ENCOUNTER — Ambulatory Visit (HOSPITAL_BASED_OUTPATIENT_CLINIC_OR_DEPARTMENT_OTHER)
Admission: RE | Admit: 2018-06-18 | Discharge: 2018-06-18 | Disposition: A | Discharge status: Home / Self Care | Payer: PPO | Source: Ambulatory Visit | Attending: Cardiology | Admitting: Cardiology

## 2018-06-18 ENCOUNTER — Encounter (HOSPITAL_COMMUNITY): Payer: Self-pay | Admitting: Cardiology

## 2018-06-18 VITALS — BP 160/62 | HR 65 | Wt 177.6 lb

## 2018-06-18 DIAGNOSIS — I110 Hypertensive heart disease with heart failure: Secondary | ICD-10-CM | POA: Diagnosis not present

## 2018-06-18 DIAGNOSIS — Z955 Presence of coronary angioplasty implant and graft: Secondary | ICD-10-CM | POA: Diagnosis not present

## 2018-06-18 DIAGNOSIS — Z7901 Long term (current) use of anticoagulants: Secondary | ICD-10-CM | POA: Diagnosis not present

## 2018-06-18 DIAGNOSIS — Z8249 Family history of ischemic heart disease and other diseases of the circulatory system: Secondary | ICD-10-CM | POA: Insufficient documentation

## 2018-06-18 DIAGNOSIS — Z7902 Long term (current) use of antithrombotics/antiplatelets: Secondary | ICD-10-CM | POA: Diagnosis not present

## 2018-06-18 DIAGNOSIS — I509 Heart failure, unspecified: Secondary | ICD-10-CM

## 2018-06-18 DIAGNOSIS — E785 Hyperlipidemia, unspecified: Secondary | ICD-10-CM | POA: Diagnosis not present

## 2018-06-18 DIAGNOSIS — I4819 Other persistent atrial fibrillation: Secondary | ICD-10-CM

## 2018-06-18 DIAGNOSIS — Z79899 Other long term (current) drug therapy: Secondary | ICD-10-CM | POA: Diagnosis not present

## 2018-06-18 DIAGNOSIS — I5043 Acute on chronic combined systolic (congestive) and diastolic (congestive) heart failure: Secondary | ICD-10-CM

## 2018-06-18 DIAGNOSIS — I4891 Unspecified atrial fibrillation: Secondary | ICD-10-CM | POA: Insufficient documentation

## 2018-06-18 DIAGNOSIS — I255 Ischemic cardiomyopathy: Secondary | ICD-10-CM | POA: Diagnosis not present

## 2018-06-18 DIAGNOSIS — Z87891 Personal history of nicotine dependence: Secondary | ICD-10-CM | POA: Diagnosis not present

## 2018-06-18 DIAGNOSIS — I5022 Chronic systolic (congestive) heart failure: Secondary | ICD-10-CM | POA: Diagnosis not present

## 2018-06-18 DIAGNOSIS — I2510 Atherosclerotic heart disease of native coronary artery without angina pectoris: Secondary | ICD-10-CM | POA: Insufficient documentation

## 2018-06-18 LAB — CBC
HCT: 44.2 % (ref 39.0–52.0)
Hemoglobin: 14 g/dL (ref 13.0–17.0)
MCH: 27.5 pg (ref 26.0–34.0)
MCHC: 31.7 g/dL (ref 30.0–36.0)
MCV: 86.8 fL (ref 80.0–100.0)
NRBC: 0 % (ref 0.0–0.2)
PLATELETS: 164 10*3/uL (ref 150–400)
RBC: 5.09 MIL/uL (ref 4.22–5.81)
RDW: 14 % (ref 11.5–15.5)
WBC: 4.7 10*3/uL (ref 4.0–10.5)

## 2018-06-18 LAB — BASIC METABOLIC PANEL
Anion gap: 9 (ref 5–15)
BUN: 15 mg/dL (ref 8–23)
CALCIUM: 9.3 mg/dL (ref 8.9–10.3)
CO2: 26 mmol/L (ref 22–32)
CREATININE: 1.06 mg/dL (ref 0.61–1.24)
Chloride: 106 mmol/L (ref 98–111)
GFR calc non Af Amer: >60 mL/min (ref >60)
GLUCOSE: 103 mg/dL — AB (ref 70–99)
Potassium: 4 mmol/L (ref 3.5–5.1)
Sodium: 141 mmol/L (ref 135–145)

## 2018-06-18 LAB — MAGNESIUM: Magnesium: 2.1 mg/dL (ref 1.7–2.4)

## 2018-06-18 LAB — LIPID PANEL
Cholesterol: 137 mg/dL (ref 0–200)
HDL: 46 mg/dL (ref >40)
LDL CALC: 48 mg/dL (ref 0–99)
Total CHOL/HDL Ratio: 3 RATIO
Triglycerides: 217 mg/dL — ABNORMAL HIGH (ref <150)
VLDL: 43 mg/dL — ABNORMAL HIGH (ref 0–40)

## 2018-06-18 MED ORDER — CARVEDILOL 3.125 MG PO TABS: 3.1250 mg | ORAL_TABLET | Freq: Two times a day (BID) | ORAL | 6 refills | Status: DC

## 2018-06-18 MED ORDER — SACUBITRIL-VALSARTAN 97-103 MG PO TABS: 1.0000 | ORAL_TABLET | Freq: Two times a day (BID) | ORAL | 6 refills | Status: DC

## 2018-06-18 MED ORDER — CARVEDILOL 3.125 MG PO TABS: 6.2500 mg | ORAL_TABLET | Freq: Two times a day (BID) | ORAL | 6 refills | Status: DC

## 2018-06-18 NOTE — Progress Notes (Signed)
  Echocardiogram 2D Echocardiogram has been performed.  Dillon Young 06/18/2018, 10:41 AM

## 2018-06-18 NOTE — Patient Instructions (Signed)
Labs done today. We will contact you for any abnormal labs.  EKG done today  Labs need to done in 2 weeks  INCREASE Entresto 97-103 twice daily  Follow up with Dr. [name] in 4 months

## 2018-06-18 NOTE — Progress Notes (Signed)
PCP: Dr. Ronnald Ramp Cardiology: Dr. [name] HF Cardiology: Dr. [name]  85 y.o. with a h/o coronary artery disease, chronic systolic CHF, and persistent atrial fibrillation was referred by Dr. [name] for evaluation of CHF.  He is status post angioplasty in 1994 and 1995of his circumflex coronary artery. He had right carotid endarterectomy performed by Dr. Curt Jews June 2013.  Given angina, he had cath in 8/17.  This showed severe RCA stenosis treated with DES. He also had a very small ramus branch that was 90% stenosed and not suitable for intervention as well as a 70% mid LAD lesion although he had no evidence of anterior ischemia on his stress test.  He was diagnosed in 5/19 with new onset atrial fibrillation. Echo was done in 5/19 and showed EF decreased to 30-35%, normal EF by echo in August 2017. He had a DCCV in 5/19, unfortunately, he only held NSR for a few days and was been back in atrial fibrillation soon after that.  He had recurrent chest pain and was again cathed in 01/04/18, this time showing 80% proximal/mid LAD that was treated with DES.  In 10/19, he was admitted for Tikosyn load and was cardioverted back to NSR.   Echo was done today and reviewed.  EF up to 55-60% with mild LVH.   He follows up for evaluation of CHF and atrial fibrillation today.  He remains in NSR on Tikosyn.  No palpitations.  He is doing very well overall.  Walks for exercise and joined the [employer].  No chest pain.  No exertional dyspnea.  No orthopnea/PND. No palpitations.    ECG: NSR, 1st degree AVB, PVCs, QTc 442 msec  Labs (5/19): LDL 50 Labs (8/19): K 4.1, creatinine 1.14 Labs (10/19): K 4.6, creatinine 1.17 Labs (11/19): K 4.5, creatinine 1.08  PMH: 1. HTN 2. Hyperlipidemia 3. Carotid artery disease: Right CEA in 6/13.  4. CAD: - PTCA to LCx in 1994, 1995.  - Angina 8/17 => DES to RCA, also noted to have 70% mLAD, 90% stenosis small ramus.  - LHC 12/26/17 with 80% ostial-proximal LAD, EF < 25% on LV-gram.  DES  to LAD.  5. Chronic systolic CHF: Ischemic cardiomyopathy.   - Echo (8/17) with normal EF.  - Echo (5/19) with EF 30-35%, severe LV dilation, regional wall motion abnormalities.  - Echo (1/20): EF 55-60%, mild LVH, normal RV size and systolic function.  6. Atrial fibrillation: Diagnosed 5/19.   - DCCV 5/19 only held for about 2 days.  - Tikosyn loaded in 10/19 and DCCV to NSR.   Social History   Socioeconomic History  . Marital status: Married    Spouse name: Not on file  . Number of children: 2  . Years of education: Not on file  . Highest education level: Not on file  Occupational History  . 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 Financial resource strain: Not on file  . Food insecurity:    Worry: Not on file    Inability: Not on file  . Transportation needs:    Medical: Not on file    Non-medical: Not on file  Tobacco Use  . Smoking status: Former Smoker    Packs/day: 1.50    Years: 60.00    Pack years: 90.00    Types: Cigarettes    Last attempt to quit: 07/25/2008    Years since quitting: 9.9  . Smokeless tobacco: Never Used  Substance and Sexual Activity  . Alcohol use: Yes  Alcohol/week: 0.0 standard drinks    Comment: Occ  . Drug use: No  . Sexual activity: Not on file  Lifestyle  . Physical activity:    Days per week: Not on file    Minutes per session: Not on file  . Stress: Not on file  Relationships  . Social connections:    Talks on phone: Not on file    Gets together: Not on file    Attends religious service: Not on file    Active member of club or organization: Not on file    Attends meetings of clubs or organizations: Not on file    Relationship status: Not on file  . Intimate partner violence:    Fear of current or ex partner: Not on file    Emotionally abused: Not on file    Physically abused: Not on file    Forced sexual activity: Not on file  Other Topics Concern  . Not on file  Social History Narrative  . Not on file    Family History  Problem Relation Age of Onset  . Heart disease Father   . Hypertension Mother   . Asthma Paternal Grandmother   . Colon cancer Neg Hx   . Esophageal cancer Neg Hx   . Stomach cancer Neg Hx   . Rectal cancer Neg Hx   . Liver cancer Neg Hx    ROS: All systems reviewed and negative except as per HPI.   Current Outpatient Medications  Medication Sig Dispense Refill  . carvedilol (COREG) 3.125 MG tablet Take 1 tablet (3.125 mg total) by mouth 2 (two) times daily. 60 tablet 6  . chlorhexidine (PERIDEX) 0.12 % solution RINSE 2 TIMES A DAY IN THE MORNING AND BEFORE BEDTIME FOR 30 SECONDS, USE SYRINGE AND PROXYBRUSH  1  . cholecalciferol (VITAMIN D) 1000 UNITS tablet Take 1,000 Units by mouth at bedtime.     . clopidogrel (PLAVIX) 75 MG tablet Take 1 tablet (75 mg total) by mouth daily with breakfast. 90 tablet 3  . dofetilide (TIKOSYN) 125 MCG capsule Take 1 capsule (125 mcg total) by mouth 2 (two) times daily. 30 capsule 0  . ELIQUIS 5 MG TABS tablet TAKE 1 TABLET BY MOUTH TWICE A DAY 180 tablet 1  . fluticasone (FLONASE) 50 MCG/ACT nasal spray Place 2 sprays into both nostrils daily. 16 g 1  . hydrocortisone (ANUSOL-HC) 2.5 % rectal cream Place 1 application rectally 2 (two) times daily as needed for hemorrhoids.     . Hypromellose (ARTIFICIAL TEARS OP) Place 1 drop into both eyes 4 (four) times daily as needed (dry eyes).    Marland Kitchen ipratropium (ATROVENT) 0.06 % nasal spray Place 2 sprays into both nostrils 4 (four) times daily. 15 mL 12  . Melatonin 10 MG TABS Take 10 mg by mouth at bedtime as needed (for sleep).     . rosuvastatin (CRESTOR) 10 MG tablet Take 10 mg by mouth daily.    Marland Kitchen spironolactone (ALDACTONE) 25 MG tablet Take 25 mg by mouth daily.    . vitamin C (ASCORBIC ACID) 500 MG tablet Take 500 mg by mouth at bedtime.     . Wheat Dextrin (BENEFIBER PO) Take 2 Scoops by mouth daily.     . nitroGLYCERIN (NITROSTAT) 0.4 MG SL tablet Place 1 tablet (0.4 mg total) under  the tongue every 5 (five) minutes as needed for chest pain. (Patient not taking: Reported on 06/18/2018) 25 tablet 3  . sacubitril-valsartan (ENTRESTO) 97-103 MG Take 1  tablet by mouth 2 (two) times daily. 60 tablet 6   No current facility-administered medications for this encounter.    BP (!) 160/62   Pulse 65   Wt 80.6 kg (177 lb 9.6 oz)   SpO2 97%   BMI 26.23 kg/m  General: NAD Neck: No JVD, no thyromegaly or thyroid nodule.  Lungs: Clear to auscultation bilaterally with normal respiratory effort. CV: Nondisplaced PMI.  Heart regular S1/S2, no S3/S4, no murmur.  No peripheral edema.  No carotid bruit.  Normal pedal pulses.  Abdomen: Soft, nontender, no hepatosplenomegaly, no distention.  Skin: Intact without lesions or rashes.  Neurologic: Alert and oriented x 3.  Psych: Normal affect. Extremities: No clubbing or cyanosis.  HEENT: Normal.   Assessment/Plan: 1. Chronic systolic CHF: Suspect mixed ischemic/nonischemic cardiomyopathy.  Echo in 5/19 with EF 30-35% and severe LV dilation.  CAD likely plays role, but fall in EF was noted around the time he went into atrial fibrillation.  No RVR, but can see fall in EF with afib even in the absence of RVR.  He has been in NSR on Tikosyn, and echo was done today showing EF up to 55-60%.   - Continue Coreg 3.125 mg bid (recently decreased with sinus bradycardia).  HR in 60s today.   - With BP still running high, will increase Entresto to 97/103 bid.  BMET 10 days. - Continue spironolactone 25 mg daily.  2. CAD: Most recently, DES to proximal/mid LAD on 12/26/17. No chest pain since then.  - Continue apixaban 5 mg bid.  CBC today.  - Continue Plavix for at least 6 months but if no bleeding complications, will continue until 8/20.  - He is now off aspirin.  - Continue Crestor, check lipids today.  3. Atrial fibrillation: Persistent.  DCCV in 5/19 only held a few days.  Tikosyn loaded in 10/19 with DCCV, now in NSR.  QTc ok on ECG today.  -  Continue Eliquis 5 mg bid.  - Continue Tikosyn.  4. HTN: Increasing Entresto as above.   Followup in 4 months.    [name] 06/18/2018

## 2018-06-19 ENCOUNTER — Other Ambulatory Visit (HOSPITAL_COMMUNITY): Payer: Self-pay | Admitting: *Deleted

## 2018-06-19 MED ORDER — DOFETILIDE 125 MCG PO CAPS: 125.0000 ug | ORAL_CAPSULE | Freq: Two times a day (BID) | ORAL | 1 refills | Status: DC

## 2018-06-20 ENCOUNTER — Other Ambulatory Visit: Payer: Self-pay | Admitting: Pharmacy Technician

## 2018-06-20 NOTE — Patient Outreach (Signed)
[place] [facility]) Care Management  06/20/2018  Dillon Young December 26, 1932 [id number]   Incoming call from patient with questions about income requirement for [employer] application for [name] that was mailed to him. He also inquired about requirements for Eliquis patient assistance and I informed his that he would have to meet an 3% OOP spend requirement to apply for it. He states that he will [employer] application back.  Will follow up with patient in 14-21 business days if document has not been received.  [name] [name] [place] Certified Pharmacy Technician [place] Management Direct Dial:[phone]

## 2018-06-25 NOTE — Progress Notes (Signed)
[facility] PREP Weekly Session   Patient Details  Name: CADE DASHNER MRN: [id number] Date of Birth: Mar 01, 1933 Age: 85 y.o. PCP: [name], MD  Vitals:   06/25/18 2144  Weight: 178 lb (80.7 kg)    [facility] Weekly seesion - 06/25/18 2100      Weekly Session   Topic Discussed  Other ways to be active    Minutes exercised this week  165 minutes   120cardio/30strength/16flexibility     Fun things you did since last meeting:"working out" Things you are grateful for:"[employer]" Barriers:"none"  Vanita Ingles 06/25/2018, 9:45 PM

## 2018-06-25 NOTE — Progress Notes (Signed)
[facility] PRE[facility] Weekly Session   Patient Details  Name: Dillon Young MRN: [id number] Date of Birth: 1932/11/03 Age: 85 y.o. PCP: [name], MD  Vitals:   06/11/18 2043  Weight: 178 lb (80.7 kg)    [facility] Weekly seesion - 06/25/18 2000      Weekly Session   Topic Discussed  Healthy eating tips    Minutes exercised this week  75 minutes   cardio     Fun things you did since last meeting:" walking"  Vanita Ingles 06/25/2018, 8:44 PM

## 2018-06-27 ENCOUNTER — Telehealth: Payer: Self-pay | Admitting: Cardiovascular Disease

## 2018-06-27 NOTE — Telephone Encounter (Signed)
Spoke with pt, he has had a headache for about 4 weeks now. It is in the top of his head and will sometimes be in the back. His entresto was increased on 06-18-2018 but he was having this problem prior to the increase. He is not on any new medications. Yesterday at one point his bp was 160 but first thing this morning it was 124/70. He has no symptoms of blurred vision or other symptoms. He does have some soreness around his eyes and feels he may have some sinus issues. He will contact his medical doctor for evaluation of his sinuses. He feels it is related to his medications but I explained that because nothing is new I doubt that is the cause. He would like me to make dr [name] aware.

## 2018-06-27 NOTE — Telephone Encounter (Signed)
Spoke with pt, aware of dr [employer] recommendations.

## 2018-06-27 NOTE — Telephone Encounter (Signed)
New message       Pt stated that he's been having headaches for about a month now. Pt is not having any other symptoms. He thinks its heart related . He asked if we could try and call before 3pm.

## 2018-06-27 NOTE — Telephone Encounter (Signed)
If he had HA before Entresto increase, probably not related.  Needs evaluation of HA by PCP.

## 2018-06-30 ENCOUNTER — Telehealth: Payer: Self-pay | Admitting: Cardiovascular Disease

## 2018-06-30 ENCOUNTER — Other Ambulatory Visit: Payer: Self-pay

## 2018-06-30 ENCOUNTER — Ambulatory Visit (HOSPITAL_COMMUNITY)
Admission: RE | Admit: 2018-06-30 | Discharge: 2018-06-30 | Disposition: A | Discharge status: Home / Self Care | Payer: PPO | Source: Ambulatory Visit | Attending: Internal Medicine | Admitting: Internal Medicine

## 2018-06-30 DIAGNOSIS — I679 Cerebrovascular disease, unspecified: Secondary | ICD-10-CM | POA: Diagnosis not present

## 2018-06-30 LAB — BASIC METABOLIC PANEL
Anion gap: 10 (ref 5–15)
BUN: 18 mg/dL (ref 8–23)
CALCIUM: 9.3 mg/dL (ref 8.9–10.3)
CO2: 29 mmol/L (ref 22–32)
Chloride: 103 mmol/L (ref 98–111)
Creatinine, Ser: 1.18 mg/dL (ref 0.61–1.24)
GFR calc Af Amer: >60 mL/min (ref >60)
GFR calc non Af Amer: 57 mL/min — ABNORMAL LOW (ref >60)
Glucose, Bld: 130 mg/dL — ABNORMAL HIGH (ref 70–99)
Potassium: 4.4 mmol/L (ref 3.5–5.1)
Sodium: 142 mmol/L (ref 135–145)

## 2018-06-30 NOTE — Patient Outreach (Signed)
[place] [facility]) Care Management  06/30/2018  Dillon Young 1932-10-06 [id number]   Medication Adherence call to Mr. Dillon Young [place] Identifiers Verify Spoke with Patient regarding past due medications on Irbesartan/ HCTZ 300/12.5 mg patient said doctor took him off this medication and is now taking Valsartan/HCTZ instead. Since August/2019.left a message at doctor office to clarify if patient is no longer taking Irbesartan/Hctz.   [place] Management Direct Dial [phone]  Fax [phone] [email or web address]

## 2018-06-30 NOTE — Telephone Encounter (Addendum)
Returned call to [employer] @ [place]. lmom. The pt is currently taking Entresto 97/103 bid. Irbesartan would have been d/c once Entresto was initiated since Irbesartan is an ARB and Entresto has an ARB component. Dillon Young is to call back if she has any questions.

## 2018-06-30 NOTE — Telephone Encounter (Signed)
Vicente Males from [facility] is calling to know what the patient is taking now instead of the Irbesartan/HCTZ.

## 2018-07-01 ENCOUNTER — Ambulatory Visit: Payer: PPO | Admitting: Adult Health

## 2018-07-02 NOTE — Progress Notes (Signed)
[facility] PREP Weekly Session   Patient Details  Name: Dillon Young MRN: [id number] Date of Birth: [birth year] Age: 85 y.o. PCP: [name], MD  Vitals:   07/02/18 2039  Weight: 176 lb (79.8 kg)    [facility] Weekly seesion - 07/02/18 2000      Weekly Session   Topic Discussed  Restaurant Eating    Minutes exercised this week  180 minutes   120cardio/30strength/80flexibility       Vanita Ingles 07/02/2018, 8:39 PM

## 2018-07-08 LAB — HM DIABETES EYE EXAM

## 2018-07-09 ENCOUNTER — Encounter: Payer: Self-pay | Admitting: Internal Medicine

## 2018-07-14 DIAGNOSIS — R310 Gross hematuria: Secondary | ICD-10-CM | POA: Diagnosis not present

## 2018-07-14 DIAGNOSIS — N200 Calculus of kidney: Secondary | ICD-10-CM | POA: Diagnosis not present

## 2018-07-18 ENCOUNTER — Telehealth: Payer: Self-pay

## 2018-07-18 NOTE — Telephone Encounter (Signed)
Faxed patient assistance form to [employer]. [name], CPhT at [facility]. Fax number: [phone]

## 2018-07-21 ENCOUNTER — Telehealth (HOSPITAL_COMMUNITY): Payer: Self-pay | Admitting: *Deleted

## 2018-07-21 ENCOUNTER — Other Ambulatory Visit: Payer: Self-pay | Admitting: Pharmacy Technician

## 2018-07-21 ENCOUNTER — Encounter: Payer: Self-pay | Admitting: *Deleted

## 2018-07-21 NOTE — Telephone Encounter (Signed)
-----   Message from Larey Dresser, MD sent at 07/14/2018 10:21 AM EST ----- He has been on Plavix for > 6 months since his stent so can stop it completely.  Can hold Eliquis for a couple of days then restart.  Thanks. Dalton ----- Message ----- From: Irine Seal, MD Sent: 07/14/2018   9:20 AM EST To: Larey Dresser, MD  Mr. Neeson is in with gross hematuria for the past week that is most likely from radiation cystitis and he is currently on plavix and eliquis.  Would it be possible for him to hold these a few days to see if the bleeding will cease?  Thanks  Irine Seal

## 2018-07-21 NOTE — Patient Outreach (Signed)
[facility] [facility]) Care Management  07/21/2018  Dillon Young [birth year] [id number]   Received provider portion(s) of patient assistance application for Entresto. Faxed completed application and required documents into [employer].  Informed my Dr. Kennon Holter office that provider portion of application was faxed into [employer] on 2/20.  Will follow up with company in 5-7 business days to check status of application.  [name] [name] [place] Certified Pharmacy Technician [place] Management Direct Dial:[phone]

## 2018-07-21 NOTE — Telephone Encounter (Signed)
Left message to call back  

## 2018-07-21 NOTE — Telephone Encounter (Signed)
This encounter was created in error - please disregard.

## 2018-07-23 NOTE — Telephone Encounter (Signed)
Left message to call back  

## 2018-07-29 DIAGNOSIS — R310 Gross hematuria: Secondary | ICD-10-CM | POA: Diagnosis not present

## 2018-07-31 ENCOUNTER — Other Ambulatory Visit: Payer: Self-pay | Admitting: Pharmacy Technician

## 2018-07-31 NOTE — Patient Outreach (Signed)
[place] [facility]) Care Management  07/31/2018  GREOGORY CORNETTE Jun 23, 1932 [id number]    Follow up call placed to [employer]  regarding patient assistance application(s) for Silvano Bilis states that provider portion of application has not been received.   In-basket to RN requesting that copy of application be refaxed to myself.  Follow up:  Will fax to [employer] when application has been received.  [name] [name] [place] Certified Pharmacy Technician [place] Management Direct Dial:[phone]

## 2018-08-01 ENCOUNTER — Other Ambulatory Visit: Payer: Self-pay | Admitting: Pharmacy Technician

## 2018-08-01 NOTE — Patient Outreach (Signed)
[place] [facility]) Care Management  08/01/2018  Dillon Young 07/04/1932 [id number]   Received provider portion(s) of patient assistance application for Entresto. Faxed completed application and required documents into [employer].  Will follow up with company in 7-10 business days to check status of application.  [name] [name] [place] Certified Pharmacy Technician [place] Management Direct Dial:[phone]

## 2018-08-04 DIAGNOSIS — M79671 Pain in right foot: Secondary | ICD-10-CM | POA: Diagnosis not present

## 2018-08-04 DIAGNOSIS — L600 Ingrowing nail: Secondary | ICD-10-CM | POA: Diagnosis not present

## 2018-08-04 DIAGNOSIS — B351 Tinea unguium: Secondary | ICD-10-CM | POA: Diagnosis not present

## 2018-08-04 DIAGNOSIS — M79672 Pain in left foot: Secondary | ICD-10-CM | POA: Diagnosis not present

## 2018-08-04 DIAGNOSIS — B353 Tinea pedis: Secondary | ICD-10-CM | POA: Diagnosis not present

## 2018-08-06 ENCOUNTER — Telehealth (HOSPITAL_COMMUNITY): Payer: Self-pay | Admitting: *Deleted

## 2018-08-06 ENCOUNTER — Other Ambulatory Visit: Payer: Self-pay | Admitting: Cardiovascular Disease

## 2018-08-06 MED ORDER — ROSUVASTATIN CALCIUM 10 MG PO TABS: 10.0000 mg | ORAL_TABLET | Freq: Every day | ORAL | 0 refills | Status: DC

## 2018-08-06 NOTE — Telephone Encounter (Signed)
 *  STAT* If patient is at the pharmacy, call can be transferred to refill team.   1. Which medications need to be refilled? (please list name of each medication and dose if known) rosuvastatin (CRESTOR) 10 MG tablet  2. Which pharmacy/location (including street and city if local pharmacy) is medication to be sent to? [facility]  3. Do they need a 30 day or 90 day supply? 90 days

## 2018-08-06 NOTE — Telephone Encounter (Signed)
Pt called requesting labs from Feb be faxed to Dr Barkley Bruns at [facility].  Labs faxed to them at [phone]

## 2018-08-14 ENCOUNTER — Other Ambulatory Visit (HOSPITAL_COMMUNITY): Payer: Self-pay | Admitting: Nurse Practitioner

## 2018-08-15 ENCOUNTER — Other Ambulatory Visit: Payer: Self-pay | Admitting: Cardiovascular Disease

## 2018-08-18 ENCOUNTER — Telehealth (HOSPITAL_COMMUNITY): Payer: Self-pay | Admitting: Cardiology

## 2018-08-18 MED ORDER — DOFETILIDE 125 MCG PO CAPS: 125.0000 ug | ORAL_CAPSULE | Freq: Two times a day (BID) | ORAL | 2 refills | Status: DC

## 2018-08-18 NOTE — Telephone Encounter (Signed)
Patient called to request refill on Tikosyn to be sent to [place]

## 2018-08-19 ENCOUNTER — Telehealth: Payer: Self-pay | Admitting: Cardiovascular Disease

## 2018-08-19 NOTE — Telephone Encounter (Signed)
New Message   Pt c/o medication issue:  1. Name of [place] (ENTRESTO) 97-103 MG   2. How are you currently taking this medication (dosage and times per day)?  Take 1 tablet by mouth 2 (two) times daily.          3. Are you having a reaction (difficulty breathing--STAT)?   4. What is your medication issue?Lorriane Shire is calling from [employer] to obtain prior authorization for medication. The prior authorzation team can be reached at  [phone] select option 3 then option 2. The approval letter can be faxed to [phone]

## 2018-08-20 NOTE — Telephone Encounter (Signed)
PA started via cover my meds.  

## 2018-08-20 NOTE — Telephone Encounter (Signed)
  Molly from Cover My Meds is calling to see if she can help with PA Ref # [id number]

## 2018-08-22 NOTE — Telephone Encounter (Signed)
PA for entresto complete and approved through 08/19/2019.

## 2018-09-13 ENCOUNTER — Other Ambulatory Visit (HOSPITAL_COMMUNITY): Payer: Self-pay | Admitting: Cardiology

## 2018-09-15 ENCOUNTER — Other Ambulatory Visit: Payer: Self-pay | Admitting: Pharmacy Technician

## 2018-09-15 NOTE — Patient Outreach (Signed)
[place] [facility]) Care Management  09/15/2018  SHAQUILLE JANES 06/10/1932 [id number]    Follow up call placed to [employer] regarding patient assistance application(s) for [place] confims patient has been approved as of 4/17.   Follow up:  Will follow up with patient in 2-3 business days to confirm medication shipment has been arranged.  [name] [name] [place] Certified Pharmacy Technician [place] Management Direct Dial:[phone]

## 2018-09-18 DIAGNOSIS — M79675 Pain in left toe(s): Secondary | ICD-10-CM | POA: Diagnosis not present

## 2018-09-18 DIAGNOSIS — M79674 Pain in right toe(s): Secondary | ICD-10-CM | POA: Diagnosis not present

## 2018-09-18 DIAGNOSIS — L600 Ingrowing nail: Secondary | ICD-10-CM | POA: Diagnosis not present

## 2018-09-24 ENCOUNTER — Other Ambulatory Visit: Payer: Self-pay

## 2018-09-24 NOTE — Patient Outreach (Signed)
  [place] [facility]) Care Management Chronic Special Needs Program  09/24/2018  Name: Dillon Young DOB: 05/04/1933  MRN: [id number]  Dillon Young is enrolled in a chronic special needs plan for Heart Failure. Chronic Care Management Coordinator telephoned client to review health risk assessment and to develop individualized care plan.  Introduced the chronic care management program, importance of client participation, and taking their care plan to all provider appointments and inpatient facilities.  Reviewed the transition of care process and possible referral to community care management.  Subjective: Client states that he has been feeling good.  States that he and his wife walk for 30 minutes every day.  Denies SOB or fatigue.  States he weights everyday but he does not write it down as they are very stable.  States his blood pressure is good and it was 110/60 today.  Denies any problems with his A. Fib and he is taking Eliquis  Goals Addressed            This Visit's Progress   . Client understands the importance of follow-up with providers by attending scheduled visits      . Client will report abillity to obtain Medications within 3 months       Continue to work with Personal assistant    . Client will report no worsening of symptoms of Atrial Fibrillation within the next 6 months      . Client will report weighing and recording weights daily for the next 6 months      . Client will verbalize knowledge of self management of Hypertension as evidences by BP reading of 140/90 or less; or as defined by provider      . Decrease inpatient Heart Failure admissions/ readmissions with in the year      . Decrease the use of hospital emergency department related to heart failure within the next year      . Maintain timely refills of Heart Failure medication as prescribed within the year       . Visit Primary Care Provider or Cardiologist at least 2  times per year       Client verbalizes with good teach back s/s to call MD for HF.  Reports adherence to low sodium diet.  Exercising daily.  Client is currently working with pharmacy assistance for [facility] but will refer for medication review for >8 meds. Discussed COVID19 cause, symptoms, precautions (social distancing, stay at home order, hand washing), confirmed client knows how to contact provider. Plan:  Send successful outreach letter with a copy of their individualized care plan, Send individual care plan to provider and Send educational material HTN, Atrial Fib  Chronic care management coordination will outreach in:  6 Months  Will refer client to:  [place] RN, [facility], [place] Management [phone]

## 2018-09-25 ENCOUNTER — Telehealth: Payer: Self-pay | Admitting: Pharmacist

## 2018-09-25 NOTE — Patient Outreach (Addendum)
[place] [facility]) Care Management  Dillon Young   09/25/2018  Dillon Young 09/30/1932 [id number]  Reason for referral: Medication Assistance, Medication Review  Referral source: [employer] C-SNP Care Manager with Dillon Young Current insurance: [employer] C-SNP  PMHx includes but not limited to:   A fib, hypertension, hemorrhoids, CVD, CAD, GERD, Constipation, and  hyperlipidemia,   Outreach:  Successful telephone call with patient.  HIPAA identifiers verified.   Subjective:  Patient reports using a pill box as an adherence strategy.  Does the patient ever forget to take medication?  no Does the patient have problems obtaining medications due to transportation?   no Does the patient have problems obtaining medications due to cost?  yes  Does the patient feel that medications prescribed are effective?  yes Does the patient ever experience any side effects to the medications prescribed?  no  Does the patient measure his/her own blood glucose at home?  No Does the patient measure his/her own blood pressure at home? Yes  129/56, 114/53 pulse 39, 149/66 pulse 51 156/66 pulse 51  Objective: Lab Results  Component Value Date   CREATININE 1.18 06/30/2018   CREATININE 1.06 06/18/2018   CREATININE 1.08 04/17/2018    Lab Results  Component Value Date   HGBA1C 6.6 (H) 04/11/2016    Lipid Panel     Component Value Date/Time   CHOL 137 06/18/2018 1202   CHOL 121 10/02/2017 1011   TRIG 217 (H) 06/18/2018 1202   HDL 46 06/18/2018 1202   HDL 46 10/02/2017 1011   CHOLHDL 3.0 06/18/2018 1202   VLDL 43 (H) 06/18/2018 1202   LDLCALC 48 06/18/2018 1202   LDLCALC 50 10/02/2017 1011    BP Readings from Last 3 Encounters:  06/18/18 (!) 160/62  06/13/18 (!) 116/58  06/04/18 140/68    Allergies  Allergen Reactions  . Lipitor [Atorvastatin] Other (See Comments)    myalgias    Medications Reviewed Today    Reviewed by Dillon Young, [place] [employer] Young  (Pharmacist) on 09/25/18 at 1511  Med List Status: <None>  Medication Order Taking? Sig Documenting Provider Last Dose Status Informant  carvedilol (COREG) 3.125 MG tablet [id number] Yes Take 1 tablet (3.125 mg total) by mouth 2 (two) times daily. Dillon Dresser, MD Taking Active         Discontinued 09/25/18 1508 (Completed Course)   cholecalciferol (VITAMIN D) 1000 UNITS tablet [id number] Yes Take 1,000 Units by mouth at bedtime.  [provider] Taking Active Self  clopidogrel (PLAVIX) 75 MG tablet [id number] No Take 1 tablet (75 mg total) by mouth daily with breakfast.  Patient not taking:  Reported on 09/25/2018   Dillon Young, [place] Not Taking Active Self  dofetilide (TIKOSYN) 125 MCG capsule [id number] Yes Take 1 capsule (125 mcg total) by mouth 2 (two) times daily. Bensimhon, Dillon Pascal, MD Taking Active   ELIQUIS 5 MG TABS tablet [id number] Yes TAKE 1 TABLET BY MOUTH TWICE A DAY Dillon Harp, MD Taking Active   fluticasone [place] [facility] [employer]) 50 MCG/ACT nasal spray [id number] Yes Place 2 sprays into both nostrils daily. Dillon Young Taking Active   hydrocortisone (ANUSOL-HC) 2.5 % rectal cream [id number] Yes Place 1 application rectally 2 (two) times daily as needed for hemorrhoids.  [provider] Taking Active Self  Hypromellose (ARTIFICIAL TEARS OP) [id number] Yes Place 1 drop into both eyes 4 (four) times daily as needed (dry eyes). [provider] Taking Active Self  ipratropium (ATROVENT) 0.06 %  nasal spray [id number] Yes Place 2 sprays into both nostrils 4 (four) times daily. Dillon Young Taking Active   Melatonin 10 MG TABS [id number] Yes Take 10 mg by mouth at bedtime as needed (for sleep).  [provider] Taking Active Self  nitroGLYCERIN (NITROSTAT) 0.4 MG SL tablet [id number] Yes Place 1 tablet (0.4 mg total) under the tongue every 5 (five) minutes as needed for chest pain. Barrett, Dillon Young Taking Active Self  rosuvastatin  (CRESTOR) 10 MG tablet [id number] Yes Take 1 tablet (10 mg total) by mouth daily. NEEDS APPT FOR FUTURE REFILLS Dillon Harp, MD Taking Active   sacubitril-valsartan [facility]) 97-103 MG [id number] Yes Take 1 tablet by mouth 2 (two) times daily. Dillon Dresser, MD Taking Active   spironolactone (ALDACTONE) 25 MG tablet [id number] Yes TAKE 1 TABLET BY MOUTH EVERY DAY Dillon Dresser, MD Taking Active   terbinafine (LAMISIL) 250 MG tablet [id number] Yes Take 250 mg by mouth daily. [provider] Taking Active   vitamin C (ASCORBIC ACID) 500 MG tablet [id number] Yes Take 500 mg by mouth at bedtime.  [provider] Taking Active Self  Wheat Dextrin (BENEFIBER PO) [id number] Yes Take 2 Scoops by mouth daily.  [provider] Taking Active Self          Assessment:  Drugs sorted by system:  Cardiovascular: Carvedilol,  dofetilide,  Eliquis, Nitroglycerin, Rosuvastatin, Entresto, Spironolactone, Nitroglycerin  Pulmonary/Allergy: Fluticasone Nasal Spray, Ipratropium Nasal Spray,   Gastrointestinal: Benefiber  Topical: Hydrocortisone rectal cream,   Vitamins/Minerals/Supplements: Cholecalciferol, Melatonin, Vitamin C,   Miscellaneous: Artificial Tears, Terbinafine  Medication Review Findings:   . Low pulse. Patient reported several low blood pressures. However, after review of several provider notes, patient's pulse has been running in the 40s-50s.  Marland Kitchen Clopidogrel is on the patient's medication list. He reported it being discontinued due to bleeding. (He is also on Eliquis)   . From a telephone note documented by Dillon Young on 07/14/2018 "He has been on Plavix for > 6 months since his stent so can stop it completely.  Can hold Eliquis for a couple of days then restart."  (Per documentation from provider, Clopidogrel was removed from the patient's medication list.)   Medication Adherence Findings: Adherence Review  _0  Excellent (no doses  missed/week)     _1  Good (no more than 1 dose missed/week)     _2  Partial (2-3 doses missed/week) _3  Poor (>3 doses missed/week)  Patient with good understanding of regimen and good understanding of indications.    Medication Assistance Findings:  Medication assistance needs identified: [name] (already approved via [employer]) and Eliquis  Extra Help:  Not eligible for Extra Help Low Income Subsidy based on reported income and assets  Patient Assistance Programs: Eliquis made by [place] requirement met: Yes o Out-of-pocket prescription expenditure met:   Unknown - Patient has met application requirements to apply for this patient assistance program.    - OOP [phone] - TDS [phone] -    -   Patient is already receiving Entresto from [employer]' Patient [place] Certified Pharmacy Technician, Etter Sjogren about the patient's financials as she worked with the patient in the past.  Patient is getting Dofetilide from [employer].  Plan: . Will follow-up in 2-3 business days..  . Route note to Sharee Pimple [place] to send patient an Eliquis Patient Media planner.   Dillon Young, PharmD, [place] Clinical Pharmacist [phone]

## 2018-09-26 ENCOUNTER — Ambulatory Visit: Payer: Self-pay | Admitting: Pharmacist

## 2018-09-26 ENCOUNTER — Other Ambulatory Visit: Payer: Self-pay | Admitting: Pharmacy Technician

## 2018-09-26 NOTE — Patient Outreach (Signed)
Dillon Young [place] [facility]) Care Management  09/26/2018  Dillon Young May 29, 1932 [id number]                          Medication Assistance Referral  Referral From: [place]  Medication/Company: Eliquis / [name] Patient application portion:  Mailed Provider application portion: Faxed  to Dr. [name]   Follow up:  Will follow up with patient in 7-10 business days to confirm application(s) have been received.  [name] [name] [place] Certified Pharmacy Technician [place] Management Direct Dial:[phone]

## 2018-10-03 ENCOUNTER — Other Ambulatory Visit: Payer: Self-pay | Admitting: Pharmacy Technician

## 2018-10-03 NOTE — Patient Outreach (Signed)
[place] [facility]) Care Management  10/03/2018  RAN TULLIS Jun 25, 1932 [id number]   Return call to patient. Mr. Boschee inquired if he needs to send printout if his spendings for his Eliquis application. Informed him that I have access to get that information so he would not need to provide it. He states that he will either fax items or mail me back the items.  Will submit application to [facility] once all documents have been received.  [name] [name] [place] Certified Pharmacy Technician [place] Management Direct Dial:[phone]

## 2018-10-10 ENCOUNTER — Other Ambulatory Visit: Payer: Self-pay | Admitting: Pharmacy Technician

## 2018-10-10 NOTE — Patient Outreach (Signed)
[place] [facility]) Care Management  10/10/2018  DAM ASHRAF 1932-08-10 [id number]   Received patient portion(s) of patient assistance application for Eliquis. Refaxed provider portion to Dr. [name] for completion.  Will submit completed application to [facility] once provider portion has been received.  [name] [name] [place] Certified Pharmacy Technician [place] Management Direct [phone]

## 2018-10-14 DIAGNOSIS — M79674 Pain in right toe(s): Secondary | ICD-10-CM | POA: Diagnosis not present

## 2018-10-14 DIAGNOSIS — L600 Ingrowing nail: Secondary | ICD-10-CM | POA: Diagnosis not present

## 2018-10-14 DIAGNOSIS — M79675 Pain in left toe(s): Secondary | ICD-10-CM | POA: Diagnosis not present

## 2018-10-15 ENCOUNTER — Ambulatory Visit (HOSPITAL_COMMUNITY)
Admission: RE | Admit: 2018-10-15 | Discharge: 2018-10-15 | Disposition: A | Discharge status: Home / Self Care | Payer: HMO | Source: Ambulatory Visit | Attending: Cardiology | Admitting: Cardiology

## 2018-10-15 ENCOUNTER — Other Ambulatory Visit: Payer: Self-pay

## 2018-10-15 ENCOUNTER — Other Ambulatory Visit (HOSPITAL_COMMUNITY): Payer: Self-pay | Admitting: Cardiovascular Disease

## 2018-10-15 ENCOUNTER — Encounter (HOSPITAL_COMMUNITY): Payer: PPO

## 2018-10-15 DIAGNOSIS — I6523 Occlusion and stenosis of bilateral carotid arteries: Secondary | ICD-10-CM | POA: Insufficient documentation

## 2018-10-15 DIAGNOSIS — I6522 Occlusion and stenosis of left carotid artery: Secondary | ICD-10-CM

## 2018-10-15 DIAGNOSIS — Z9889 Other specified postprocedural states: Secondary | ICD-10-CM

## 2018-10-16 ENCOUNTER — Encounter (HOSPITAL_COMMUNITY): Payer: PPO | Admitting: Cardiology

## 2018-10-22 ENCOUNTER — Ambulatory Visit: Payer: Self-pay | Admitting: Pharmacist

## 2018-10-22 DIAGNOSIS — L570 Actinic keratosis: Secondary | ICD-10-CM | POA: Diagnosis not present

## 2018-10-22 DIAGNOSIS — L308 Other specified dermatitis: Secondary | ICD-10-CM | POA: Diagnosis not present

## 2018-10-22 DIAGNOSIS — L820 Inflamed seborrheic keratosis: Secondary | ICD-10-CM | POA: Diagnosis not present

## 2018-10-22 DIAGNOSIS — X32XXXD Exposure to sunlight, subsequent encounter: Secondary | ICD-10-CM | POA: Diagnosis not present

## 2018-10-23 ENCOUNTER — Other Ambulatory Visit: Payer: Self-pay | Admitting: Pharmacy Technician

## 2018-10-23 NOTE — Patient Outreach (Signed)
[place] [facility]) Care Management  10/23/2018  ARTUR WINNINGHAM [birth year] [id number]   Received provider portion(s) of patient assistance application for Eliquis. Faxed completed application and required documents into BMS.  Will follow up with company in 7-10 business days to check status of application.  [name] [name] [place] Certified Pharmacy Technician [place] Management Direct Dial:[phone]

## 2018-10-28 ENCOUNTER — Other Ambulatory Visit: Payer: Self-pay | Admitting: Pharmacist

## 2018-10-28 ENCOUNTER — Other Ambulatory Visit: Payer: Self-pay | Admitting: Pharmacy Technician

## 2018-10-28 NOTE — Patient Outreach (Signed)
[place] [facility]) Care Management  10/28/2018  Dillon Young [birth year] [id number]   Patient called and said he had been experiencing a rash all over his body and felt like the rash was being caused by terbinafine.  HIPAA identifiers were obtained.  Patient described the rash as an all over rash that appeared red in color. He said it did not itch.    He wondered if Terbinafine could be causing the rash as it was the last medication he was started on.  Terbinafine has a 6% incidence of causing a rash.  Patient was encouraged to call his provider. A message was left on Dr. Vivia [place] nurse's voicemail.    I called the patient back later in the afternoon to see if he heard back from the provider's office and he said he had not.  He said when he tried to call earlier, their phone was busy.    Plan: Follow up in 2-3 business days.   [name], PharmD, [place] Clinical Pharmacist [phone]

## 2018-10-28 NOTE — Patient Outreach (Signed)
[place] [facility]) Care Management  10/28/2018  WILKES POTVIN 01-11-1933 [id number]    Incoming call received from patient, HIPAA identifiers verified.  Patient called asking for former [facility] RPh Joetta Manners. Patient informed he started Terbinafine not too long ago and now has a breakout of red spots that looks like measles but is not measles. He informed he would like to speak to a pharmacist regarding this. Provided patient [place] Boyd's phone number. Patient was agreeable to reach out to her.  Will route note to [place] to notify her that the call is coming.  Harveer Sadler P. [place] H[place]ar, [place] Management [phone]

## 2018-10-28 NOTE — Patient Outreach (Signed)
[place] [facility]) Care Management  10/28/2018  Dillon Young 07-21-1932 [id number]    Follow up call placed to [place] regarding patient assistance application(s) for Eliquis , [place] confirms patient has been approved as of 6/1 until 05/28/19.    Successful call placed to patient regarding patient assistance update for Eliquis, HIPAA identifiers verified. Informed [name] of above details, provided him with [facility]'s phone number so that he can call and set up shipment if company has not outreached him in 24- 48 hours.  Follow up:  Will follow up with patient in 3-5 business days to confirm shipment has been arranged.  [name] [name] [place] Certified Pharmacy Technician [place] Management Direct Dial:[phone]

## 2018-10-29 ENCOUNTER — Other Ambulatory Visit: Payer: Self-pay | Admitting: Pharmacist

## 2018-10-29 NOTE — Patient Outreach (Signed)
[place] [facility]) Care Management  10/29/2018  Dillon Young Jun 28, 1932 [id number]   Patient called to inquire about the status of his Eliquis application. HIPAA identifiers were obtained. Chart review revealed the patient was called by Etter Sjogren today and was given the phone number to [facility] as they are the dispensing pharmacy for [place] Patient Assistance Program.    Patient said he was having trouble reaching [place]. A conference call was placed with [facility] and the patient and he was able to set up delivery.  The representative said Eliquis would be delivered to the patient tomorrow.  Plan: Route note to Etter Sjogren Continue previously scheduled follow up phone call with the patient.   [name], PharmD, [place] Clinical Pharmacist [phone]

## 2018-10-30 NOTE — Patient Outreach (Addendum)
[place] [facility]) Care Management  10/30/2018  Dillon Young January 16, 1933 [id number]   [name] called on 10/29/2018 stating that he was calling back about the message he had been given earlier in the day referring to Eliquis and the [employer] patient assistance program. HIPAA identifiers were obtained.  From chart review, patient spoke with Etter Sjogren, CPhT and was instructed to call [facility] to set up delivery of Eliquis.  [facility] was called with the patient on conference and the delivery was set-up.  I spoke with the patient earlier in the week about a rash he felt like was being cause by terbinafine. His Podiatrist's office was called and a message was left. For Dr. Vivia [place] nurse on 10/28/2018.    Another message was left on [name]'s voicemail (at Dr. Vivia Birmingham office).  Plan: [place] [employer] will follow up with the patient on patient assistance.  Call patient in 1 week about rash with terbinafine.  [name], PharmD, [place] Clinical Pharmacist [phone]

## 2018-10-31 ENCOUNTER — Ambulatory Visit: Payer: Self-pay | Admitting: Pharmacist

## 2018-11-05 ENCOUNTER — Other Ambulatory Visit: Payer: Self-pay | Admitting: Pharmacy Technician

## 2018-11-05 NOTE — Patient Outreach (Addendum)
[facility]) Care Management  11/05/2018  TRACKER MANCE 1933/03/17 [id number]    Unsuccessful call #1 placed to patient regarding patient assistance medication receipt from [place], HIPAA compliant voicemail left.   Follow up:  Will make 2nd call attempt in 2-3 business days to confirm Eliquis was received, if call has not been returned.  [name] [name] [place] Certified Pharmacy Technician [place] Management Direct Dial:[phone]   ADDENDUM 3:10pm  Incoming call from patient stating her received his Eliquis from [place] ([place]). He states he does not currently have any questions.   Requested that he contact me if he has any issues obtaining his refills for [facility]) or Eliquis [employer]), he states he will.  Will route note to [place] for case closure.  [name] [name] [place] Certified Pharmacy Technician [place] Management Direct Dial:[phone]

## 2018-11-06 ENCOUNTER — Other Ambulatory Visit: Payer: Self-pay | Admitting: Pharmacist

## 2018-11-06 ENCOUNTER — Ambulatory Visit: Payer: Self-pay | Admitting: Pharmacist

## 2018-11-06 NOTE — Patient Outreach (Signed)
[facility] [facility]) Care Management  11/06/2018  GOKUL WAYBRIGHT Jan 17, 1933 [id number]  Called patient to follow up on the rash he reported.  His provider stopped terbinafine last week. Unfortunately, patient did not answer the phone. HIPAA compliant message was left on his voicemail.  Patient has received his medications from patient assistance (Eliquis and [place]).   [name], PharmD, BCACP [facility] Clinical Pharmacist [phone]    Plan: Call patient back in 10-14 days. Await a call back from the patient.

## 2018-11-11 DIAGNOSIS — M79674 Pain in right toe(s): Secondary | ICD-10-CM | POA: Diagnosis not present

## 2018-11-11 DIAGNOSIS — B351 Tinea unguium: Secondary | ICD-10-CM | POA: Diagnosis not present

## 2018-11-11 DIAGNOSIS — M79675 Pain in left toe(s): Secondary | ICD-10-CM | POA: Diagnosis not present

## 2018-11-11 DIAGNOSIS — L600 Ingrowing nail: Secondary | ICD-10-CM | POA: Diagnosis not present

## 2018-11-17 ENCOUNTER — Other Ambulatory Visit: Payer: Self-pay | Admitting: Pharmacist

## 2018-11-17 NOTE — Patient Outreach (Signed)
[place] [facility]) Care Management  11/17/2018  Dillon Young [birth year] [id number]  Patient was called to follow up on the rash he complained about a few weeks ago. HIPAA identifiers were obtained. Patient felt like the rash was caused by Terbinafine. His provider was contacted and they stopped the terbinafine. Patient said the rash has begun to dry up and that he was satisfied with the results.   Patient was being followed by [place] services due to medication assistance and because he is part of CSNP with [facility]. He received Entresto and Eliquis via patient assistance programs.  Plan: Call the patient back for CSNP quarterly review.  [name], PharmD, [place] Clinical Pharmacist [phone]

## 2018-11-18 ENCOUNTER — Ambulatory Visit: Payer: Self-pay | Admitting: Pharmacist

## 2018-11-20 ENCOUNTER — Telehealth (HOSPITAL_COMMUNITY): Payer: Self-pay | Admitting: Cardiology

## 2018-11-20 NOTE — Telephone Encounter (Signed)
Called and left patient message with rescheduled appt info of 12/01/2018 @8 :40 with DM (orig appt 12/09/2018).  Asked pt to call back to confirm appt.

## 2018-12-01 ENCOUNTER — Other Ambulatory Visit: Payer: Self-pay

## 2018-12-01 ENCOUNTER — Ambulatory Visit (HOSPITAL_COMMUNITY)
Admission: RE | Admit: 2018-12-01 | Discharge: 2018-12-01 | Disposition: A | Discharge status: Home / Self Care | Payer: HMO | Source: Ambulatory Visit | Attending: Cardiology | Admitting: Cardiology

## 2018-12-01 ENCOUNTER — Encounter (HOSPITAL_COMMUNITY): Payer: Self-pay | Admitting: *Deleted

## 2018-12-01 DIAGNOSIS — Z9861 Coronary angioplasty status: Secondary | ICD-10-CM | POA: Diagnosis not present

## 2018-12-01 DIAGNOSIS — I2510 Atherosclerotic heart disease of native coronary artery without angina pectoris: Secondary | ICD-10-CM | POA: Diagnosis not present

## 2018-12-01 DIAGNOSIS — I5022 Chronic systolic (congestive) heart failure: Secondary | ICD-10-CM

## 2018-12-01 NOTE — Patient Instructions (Signed)
Stop Carvedilol  Our office will call you to schedule lab work and EKG  We will contact you in 4 months to schedule your next appointment.  If you have any questions or concerns before your next appointment please send [place] a message through [place] or call our office at [phone].  At the [facility], you and your health needs are our priority. As part of our continuing mission to provide you with exceptional heart care, we have created designated Provider Care Teams. These Care Teams include your primary Cardiologist (physician) and Advanced Practice Providers (APPs- Physician Assistants and Nurse Practitioners) who all work together to provide you with the care you need, when you need it.   You may see any of the following providers on your designated Care Team at your next follow up: Marland Kitchen Dr Glori Bickers . Dr [name] . Darrick Grinder, NP

## 2018-12-01 NOTE — Progress Notes (Signed)
Larey Dresser, MD  [name], RN        1. He is off Plavix, take off med list.  2. Stop Coreg.  3. Needs to come to office for ECG, BMET, Mg, CBC.  4. Followup 4 months in office.    All orders placed, AVS sent via mychart, message sent to schedulers to arrange

## 2018-12-01 NOTE — Progress Notes (Signed)
Heart Failure TeleHealth Note  Due to national recommendations of social distancing due to [place] 19, Audio/video telehealth visit is felt to be most appropriate for this patient at this time.  See MyChart message from today for patient consent regarding telehealth for [facility].  Date:  12/01/2018   ID:  Dillon Young, DOB 1933/01/09, MRN [id number]  Location: Home  Provider location: [facility] Type of Visit: Established patient   PCP:  [name], MD  Cardiologist:  [name], MD HF Cardiology: Dr. [name]  Chief Complaint: Fatigue   History of Present Illness: Dillon Young is a 85 y.o. male who presents via audio/video conferencing for a telehealth visit today.     he denies symptoms worrisome for COVID 19.   Patient has a h/o coronary artery disease, chronic systolic CHF, and persistent atrial fibrillation, he was initially referred by Dr. [name] for evaluation of CHF.  He is status post angioplasty in 1994 and 1995of his circumflex coronary artery. He had right carotid endarterectomy performed by Dr. Curt Jews June 2013.  Given angina, he had cath in 8/17.  This showed severe RCA stenosis treated with DES. He also had a very small ramus branch that was 90% stenosed and not suitable for intervention as well as a 70% mid LAD lesion although he had no evidence of anterior ischemia on his stress test.  He was diagnosed in 5/19 with new onset atrial fibrillation. Echo was done in 5/19 and showed EF decreased to 30-35%, normal EF by echo in August 2017. He had a DCCV in 5/19, unfortunately, he only held NSR for a few days and was been back in atrial fibrillation soon after that.  He had recurrent chest pain and was again cathed in 01/04/18, this time showing 80% proximal/mid LAD that was treated with DES.  In 10/19, he was admitted for Tikosyn load and was cardioverted back to NSR.   Echo in 1/20 showed  EF up to 55-60% with mild LVH.   He  has been doing well recently.  Currently just on Eliquis, Plavix was stopped recently due to hematuria (>6 months post-PCI).  Hematuria has resolved.  No lightheadedness or syncope.  No BRBPR/melena.  He has occasional sinus congestion and drainage but no significant exertional dyspnea or chest pain.  He has a weak/fatigued feeling a few hours after taking his meds that subsides.  He walks daily for 30 minutes.     Labs (5/19): LDL 50 Labs (8/19): K 4.1, creatinine 1.14 Labs (10/19): K 4.6, creatinine 1.17 Labs (11/19): K 4.5, creatinine 1.08 Labs (1/20): LDL 48 Labs (2/20): K 4.4, creatinine 1.18  PMH: 1. HTN 2. Hyperlipidemia 3. Carotid artery disease: Right CEA in 6/13.  - Carotid dopplers (5/20): 1-39% BICA stenosis, left subclavian stenosis.  4. CAD: - PTCA to LCx in 1994, 1995.  - Angina 8/17 => DES to RCA, also noted to have 70% mLAD, 90% stenosis small ramus.  - LHC 12/26/17 with 80% ostial-proximal LAD, EF < 25% on LV-gram.  DES to LAD.  5. Chronic systolic CHF: Ischemic cardiomyopathy.   - Echo (8/17) with normal EF.  - Echo (5/19) with EF 30-35%, severe LV dilation, regional wall motion abnormalities.  - Echo (1/20): EF 55-60%, mild LVH, normal RV size and systolic function.  6. Atrial fibrillation: Diagnosed 5/19.   - DCCV 5/19 only held for about 2 days.  - Tikosyn loaded in 10/19 and DCCV to NSR.  Current Outpatient Medications  Medication Sig Dispense Refill  . cholecalciferol (VITAMIN D) 1000 UNITS tablet Take 1,000 Units by mouth at bedtime.     . dofetilide (TIKOSYN) 125 MCG capsule Take 1 capsule (125 mcg total) by mouth 2 (two) times daily. 180 capsule 2  . ELIQUIS 5 MG TABS tablet TAKE 1 TABLET BY MOUTH TWICE A DAY 180 tablet 1  . fluticasone (FLONASE) 50 MCG/ACT nasal spray Place 2 sprays into both nostrils daily. 16 g 1  . hydrocortisone (ANUSOL-HC) 2.5 % rectal cream Place 1 application rectally 2 (two) times daily as needed for hemorrhoids.     .  Hypromellose (ARTIFICIAL TEARS OP) Place 1 drop into both eyes 4 (four) times daily as needed (dry eyes).    Marland Kitchen ipratropium (ATROVENT) 0.06 % nasal spray Place 2 sprays into both nostrils 4 (four) times daily. 15 mL 12  . Melatonin 10 MG TABS Take 10 mg by mouth at bedtime as needed (for sleep).     . nitroGLYCERIN (NITROSTAT) 0.4 MG SL tablet Place 1 tablet (0.4 mg total) under the tongue every 5 (five) minutes as needed for chest pain. 25 tablet 3  . rosuvastatin (CRESTOR) 10 MG tablet Take 1 tablet (10 mg total) by mouth daily. NEEDS APPT FOR FUTURE REFILLS 90 tablet 0  . sacubitril-valsartan (ENTRESTO) 97-103 MG Take 1 tablet by mouth 2 (two) times daily. 60 tablet 6  . spironolactone (ALDACTONE) 25 MG tablet TAKE 1 TABLET BY MOUTH EVERY DAY 90 tablet 1  . terbinafine (LAMISIL) 250 MG tablet Take 250 mg by mouth daily.    . vitamin C (ASCORBIC ACID) 500 MG tablet Take 500 mg by mouth at bedtime.     . Wheat Dextrin (BENEFIBER PO) Take 2 Scoops by mouth daily.      No current facility-administered medications for this encounter.     Allergies:   Lipitor [atorvastatin]   Social History:  The patient  reports that he quit smoking about 10 years ago. His smoking use included cigarettes. He has a 90.00 pack-year smoking history. He has never used smokeless tobacco. He reports current alcohol use. He reports that he does not use drugs.   Family History:  The patient's family history includes Asthma in his paternal grandmother; Heart disease in his father; Hypertension in his mother.   ROS:  Please see the history of present illness.   All other systems are personally reviewed and negative.   Exam:  (Video/Tele Health Call; Exam is subjective and or/visual.) BP 120/59, HR 54 General:  Speaks in full sentences. No resp difficulty. Lungs: Normal respiratory effort with conversation.  Abdomen: Non-distended per patient report Extremities: Pt denies edema. Neuro: Alert & oriented x 3.   Recent  Labs: 06/18/2018: Hemoglobin 14.0; Magnesium 2.1; Platelets 164 06/30/2018: BUN 18; Creatinine, Ser 1.18; Potassium 4.4; Sodium 142  Personally reviewed   Wt Readings from Last 3 Encounters:  07/02/18 79.8 kg (176 lb)  06/25/18 80.7 kg (178 lb)  06/18/18 80.6 kg (177 lb 9.6 oz)      ASSESSMENT AND PLAN:  1. Chronic systolic CHF: Suspect mixed ischemic/nonischemic cardiomyopathy.  Echo in 5/19 with EF 30-35% and severe LV dilation.  CAD likely plays role, but fall in EF was noted around the time he went into atrial fibrillation.  No RVR, but can see fall in EF with afib even in the absence of RVR.  He has been in NSR on Tikosyn, and echo from 1/20 showed EF up to 55-60%.   -  HR has been in 40s-50s on his measure.  With improved EF, I will have him stop Coreg.   - Continue Entresto 97/103 bid.  I will arrange for BMET.  - Continue spironolactone 25 mg daily.  2. CAD: Most recently, DES to proximal/mid LAD on 12/26/17. No chest pain since then.  He was on Plavix for > 6 months then stopped it with gross hematuria.  - Continue apixaban 5 mg bid.  CBC today.  - No ASA with apixaban use.  - Continue Crestor, good lipids in 1/20.  3. Atrial fibrillation: Paroxysmal.  DCCV in 5/19 only held a few days.  Tikosyn loaded in 10/19 with DCCV, now in NSR.    - Continue Eliquis 5 mg bid.  - Continue Tikosyn, when he comes to the office for labs, he will need ECG to follow QTc.  4. HTN: BP controlled.    COVID screen The patient does not have any symptoms that suggest any further testing/ screening at this time.  Social distancing reinforced today.  Patient Risk: After full review of this patients clinical status, I feel that they are at moderate risk for cardiac decompensation at this time.  Relevant cardiac medications were reviewed at length with the patient today. The patient does not have concerns regarding their medications at this time.   Recommended follow-up:  4 months  Today, I have spent 16  minutes with the patient with telehealth technology discussing the above issues .    Signed, [name], MD  12/01/2018  [place] [address] [facility] [place] [address] [phone] (office) [phone] (fax)

## 2018-12-03 ENCOUNTER — Other Ambulatory Visit: Payer: Self-pay

## 2018-12-03 ENCOUNTER — Ambulatory Visit (HOSPITAL_COMMUNITY)
Admission: RE | Admit: 2018-12-03 | Discharge: 2018-12-03 | Disposition: A | Discharge status: Home / Self Care | Payer: HMO | Source: Ambulatory Visit | Attending: Cardiology | Admitting: Cardiology

## 2018-12-03 DIAGNOSIS — Z9861 Coronary angioplasty status: Secondary | ICD-10-CM | POA: Insufficient documentation

## 2018-12-03 DIAGNOSIS — I2510 Atherosclerotic heart disease of native coronary artery without angina pectoris: Secondary | ICD-10-CM | POA: Diagnosis not present

## 2018-12-03 DIAGNOSIS — I493 Ventricular premature depolarization: Secondary | ICD-10-CM | POA: Insufficient documentation

## 2018-12-03 DIAGNOSIS — I440 Atrioventricular block, first degree: Secondary | ICD-10-CM | POA: Diagnosis not present

## 2018-12-03 LAB — BASIC METABOLIC PANEL
Anion gap: 6 (ref 5–15)
BUN: 15 mg/dL (ref 8–23)
CO2: 29 mmol/L (ref 22–32)
Calcium: 9.5 mg/dL (ref 8.9–10.3)
Chloride: 105 mmol/L (ref 98–111)
Creatinine, Ser: 1.16 mg/dL (ref 0.61–1.24)
GFR calc Af Amer: >60 mL/min (ref >60)
GFR calc non Af Amer: 58 mL/min — ABNORMAL LOW (ref >60)
Glucose, Bld: 127 mg/dL — ABNORMAL HIGH (ref 70–99)
Potassium: 4.6 mmol/L (ref 3.5–5.1)
Sodium: 140 mmol/L (ref 135–145)

## 2018-12-03 LAB — CBC
HCT: 44.6 % (ref 39.0–52.0)
Hemoglobin: 14.2 g/dL (ref 13.0–17.0)
MCH: 27.3 pg (ref 26.0–34.0)
MCHC: 31.8 g/dL (ref 30.0–36.0)
MCV: 85.8 fL (ref 80.0–100.0)
Platelets: 155 10*3/uL (ref 150–400)
RBC: 5.2 MIL/uL (ref 4.22–5.81)
RDW: 13.9 % (ref 11.5–15.5)
WBC: 4.9 10*3/uL (ref 4.0–10.5)
nRBC: 0 % (ref 0.0–0.2)

## 2018-12-03 LAB — MAGNESIUM: Magnesium: 2.1 mg/dL (ref 1.7–2.4)

## 2018-12-09 ENCOUNTER — Encounter (HOSPITAL_COMMUNITY): Payer: HMO | Admitting: Cardiology

## 2018-12-10 ENCOUNTER — Ambulatory Visit: Payer: Self-pay | Admitting: Pharmacist

## 2018-12-13 ENCOUNTER — Other Ambulatory Visit: Payer: Self-pay | Admitting: Physician Assistant

## 2018-12-14 ENCOUNTER — Other Ambulatory Visit (HOSPITAL_COMMUNITY): Payer: Self-pay | Admitting: Cardiology

## 2018-12-26 ENCOUNTER — Telehealth: Payer: Self-pay | Admitting: *Deleted

## 2018-12-26 NOTE — Telephone Encounter (Signed)

## 2018-12-31 ENCOUNTER — Encounter: Payer: Self-pay | Admitting: Cardiovascular Disease

## 2018-12-31 ENCOUNTER — Telehealth (INDEPENDENT_AMBULATORY_CARE_PROVIDER_SITE_OTHER): Payer: HMO | Admitting: Cardiovascular Disease

## 2018-12-31 ENCOUNTER — Telehealth: Payer: Self-pay

## 2018-12-31 DIAGNOSIS — I10 Essential (primary) hypertension: Secondary | ICD-10-CM | POA: Diagnosis not present

## 2018-12-31 DIAGNOSIS — R001 Bradycardia, unspecified: Secondary | ICD-10-CM | POA: Diagnosis not present

## 2018-12-31 DIAGNOSIS — E785 Hyperlipidemia, unspecified: Secondary | ICD-10-CM

## 2018-12-31 DIAGNOSIS — I6521 Occlusion and stenosis of right carotid artery: Secondary | ICD-10-CM

## 2018-12-31 DIAGNOSIS — I4819 Other persistent atrial fibrillation: Secondary | ICD-10-CM | POA: Diagnosis not present

## 2018-12-31 DIAGNOSIS — I2510 Atherosclerotic heart disease of native coronary artery without angina pectoris: Secondary | ICD-10-CM | POA: Diagnosis not present

## 2018-12-31 DIAGNOSIS — I519 Heart disease, unspecified: Secondary | ICD-10-CM

## 2018-12-31 DIAGNOSIS — Z9861 Coronary angioplasty status: Secondary | ICD-10-CM

## 2018-12-31 NOTE — Patient Instructions (Signed)
Medication Instructions:  Your physician recommends that you continue on your current medications as directed. Please refer to the Current Medication list given to you today.  If you need a refill on your cardiac medications before your next appointment, please call your pharmacy.   Lab work: NONE If you have labs (blood work) drawn today and your tests are completely normal, you will receive your results only by: Marland Kitchen MyChart Message (if you have MyChart) OR . A paper copy in the mail If you have any lab test that is abnormal or we need to change your treatment, we will call you to review the results.  Testing/Procedures: Your physician has requested that you have a carotid duplex. This test is an ultrasound of the carotid arteries in your neck. It looks at blood flow through these arteries that supply the brain with blood. Allow one hour for this exam. There are no restrictions or special instructions. TO BE SCHEDULED FOR MAY 2021. YOU WILL BE CONTACTED BY A SCHEDULER TO SET UP THIS APPOINTMENT.  Follow-Up: At [facility], you and your health needs are our priority.  As part of our continuing mission to provide you with exceptional heart care, we have created designated Provider Care Teams.  These Care Teams include your primary Cardiologist (physician) and Advanced Practice Providers (APPs -  Physician Assistants and Nurse Practitioners) who all work together to provide you with the care you need, when you need it. . You will need a follow up appointment in 12 months WITH DR. [name].  Please call our office 2 months in advance to schedule this appointment.  PLEASE HAVE YOUR ULTRASOUND STUDY COMPLETED BEFORE THIS APPOINTMENT.

## 2018-12-31 NOTE — Telephone Encounter (Signed)
Left message stating 8/5 AVS info to be released on mychart for review

## 2018-12-31 NOTE — Progress Notes (Signed)
Virtual Visit via Telephone Note   This visit type was conducted due to national recommendations for restrictions regarding the COVID-19 Pandemic (e.g. social distancing) in an effort to limit this patient's exposure and mitigate transmission in our community.  Due to his co-morbid illnesses, this patient is at least at moderate risk for complications without adequate follow up.  This format is felt to be most appropriate for this patient at this time.  The patient did not have access to video technology/had technical difficulties with video requiring transitioning to audio format only (telephone).  All issues noted in this document were discussed and addressed.  No physical exam could be performed with this format.  Please refer to the patient's chart for his  consent to telehealth for [facility].   Date:  12/31/2018   ID:  Dillon Young, DOB 1932/08/02, MRN [id number]  Patient Location: Home Provider Location: Home  PCP:  [name], MD  Cardiologist:  [name], MD  Electrophysiologist:  None   Evaluation Performed:  Follow-Up Visit  Chief Complaint: Follow-up CAD, cardiomyopathy, atrial fibrillation  History of Present Illness:    Dillon Young is a 85 y.o.  married Caucasian male father of 2 children, grandfather to 2 grandchildren he was formally a patient of Dr. [name]. I last saw him in the office 05/07/2018. 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 He works out 5 days a week and has joined [employer].He has a history of coronary artery disease status post angioplasty in 1994 and 1995 Of his circumflex coronary artery. He has not required cardiac catheterization since stress Myoview was performed in 2011 that was low risk and nonischemic. He has a history of hypertension and hyperlipidemia on medication. He had right carotid endarterectomy performed by Dr. [name] Early June 2013 followed  by duplex ultrasound. He denies chest pain or shortness of breath. His major complaint is of cramping in his thighs which may be related to his statin drug. He was given a statin holiday which really did not impact his symptoms and therefore statin was restarted and follow-up lipid profile performed 07/05/14 revealed a total cholesterol 131, LDL 58 and HDL of 44. Recent carotid Dopplers revealed his endarterectomy site was widely patent with moderate left ICA stenosis and moderate left subclavian artery stenosis without symptoms. He developed symptoms in August of this year and saw [name]. A Myoview stress test performed 01/09/16 was notable for ischemia in the RCA distribution. Based on this he underwent cardiac catheterization by Dr. Varanasi8/21/17 revealing a 90% segmental mid dominant RCA stenosis which was stented with a Synergy 3.5 mm x 20 mm long drug-eluting stent. He did have a very small ramus branch that was 90% stenosed and not suitable for intervention as well as a 70% mid LAD lesion although he had no evidence of anterior ischemia on his stress test. His symptoms of chest pain have resolved. Since I saw himthis months ago he is remained stable. He denies chest pain or shortness of breath. Does have mild ankle edema. He is in newly recognized atrial fibrillation today. He does relate a recent episode of hematuria which lasted for 10 days thought to be related related to his prostate and remote prostate cancer status post radioactive seed implantation. He did see Dr. [name], his urologist for evaluation and stopped his aspirin briefly with resolution of his hematuria. He has restarted dual anti-platelet therapy.  Since I  saw him in the office a month ago he has had a 2D echo that revealed decline in his LV function from 50 to 55% by his last 2D echo 2 years ago to 30 to 35%. This may be related to his atrial fibrillation. He feels weak and fatigued with some shortness of breath as  well.  Because of relatively new onset chest pain he saw [name] in the office he began him on oral nitrate. His pain improved but he was referred for diagnostic coronary angiography to define his anatomy.  I performed cardiac catheterization on him 12/26/2017 revealing a patent RCA stent with high-grade mid LAD lesion which underwent PCI and drug-eluting stenting.  He was also found at that time by a left cheek log to have an EF of 25 to 30%.  He was in A. fib.  He subsequently undergone pharmacologic cardioversion with Tikosyn.  He is on optimal medical therapy for his LV dysfunction and has seen Dr. [name] in the advanced heart failure clinic as recently as 12/01/2018.  His ejection fraction increased to normal after cardioversion.  He was somewhat weak and bradycardic, and his carvedilol was discontinued by Dr. [name] recently resulting in improvement in his symptoms.  He denies chest pain or shortness of breath.  He was in sinus rhythm when he saw Dr. [name] 12/03/2018.  The patient does not have symptoms concerning for COVID-19 infection (fever, chills, cough, or new shortness of breath).    Past Medical History:  Diagnosis Date  . Adenomatous colon polyp   . Allergic rhinitis   . BPH (benign prostatic hyperplasia)   . CAD (coronary artery disease)    sees Dr. [name]   . Carotid artery disease ([place])   . CHF (congestive heart failure) ([place])   . Chronic kidney disease    [place]. Prostate cancer  . Clotting disorder ([place])   . CVA (cerebral infarction)   . Diabetes mellitus    "Borderline"  . Diverticulitis   . GERD (gastroesophageal reflux disease)    history  . Hemorrhoids   . HTN (hypertension)   . Hyperlipidemia LDL goal <70   . IBS (irritable bowel syndrome)   . Low back pain   . Prostate cancer ([place]) 2008   seed implantation  . Sleep apnea    last study was aborted.  Has never worn a CPAP.    Marland Kitchen Smoker    Past Surgical History:  Procedure Laterality  Date  . APPENDECTOMY    . CARDIAC CATHETERIZATION N/A 01/16/2016   Procedure: Left Heart Cath and Coronary Angiography;  Surgeon: Jettie Booze, MD;  Location: [facility];  Service: Cardiovascular;  Laterality: N/A;  . CARDIAC CATHETERIZATION N/A 01/16/2016   Procedure: Coronary Stent Intervention;  Surgeon: Jettie Booze, MD;  Location: [facility];  Service: Cardiovascular;  Laterality: N/A;  . CARDIOVERSION N/A 11/26/2017   Procedure: CARDIOVERSION;  Surgeon: [name], MD;  Location: [facility] ENDOSCOPY;  Service: Cardiovascular;  Laterality: N/A;  . CARDIOVERSION N/A 03/13/2018   Procedure: CARDIOVERSION;  Surgeon: [name], MD;  Location[facility] ENDOSCOPY;  Service: Cardiovascular;  Laterality: N/A;  . CATARACT EXTRACTION W/ INTRAOCULAR LENS  IMPLANT, BILATERAL Bilateral   . COLONOSCOPY  10/21/2008   diverticulosis, radiation proctitis, internal hemorrhoids  . CORONARY STENT INTERVENTION N/A 12/26/2017   Procedure: CORONARY STENT INTERVENTION;  Surgeon: [name], MD;  Location: [facility];  Service: Cardiovascular;  Laterality: N/A;  . ENDARTERECTOMY  11/12/2011   Procedure: ENDARTERECTOMY CAROTID;  Surgeon: Rosetta Posner, MD;  Location: [facility] OR;  Service: Vascular;  Laterality: Right;  . ESOPHAGOGASTRODUODENOSCOPY (EGD) WITH ESOPHAGEAL DILATION    . HEMORRHOID BANDING    . LEFT HEART CATH AND CORONARY ANGIOGRAPHY N/A 12/26/2017   Procedure: LEFT HEART CATH AND CORONARY ANGIOGRAPHY;  Surgeon: [name], MD;  Location: [place] [facility];  Service: Cardiovascular;  Laterality: N/A;  . LITHOTRIPSY  X 3  . LUMBAR LAMINECTOMY    . TRANSURETHRAL RESECTION OF PROSTATE  10/2003     No outpatient medications have been marked as taking for the 12/31/18 encounter (Appointment) with [name], MD.     Allergies:   Lipitor [atorvastatin]   Social History   Tobacco Use  . Smoking status: Former Smoker    Packs/day: 1.50    Years: 60.00     Pack years: 90.00    Types: Cigarettes    Quit date: 07/25/2008    Years since quitting: 10.4  . Smokeless tobacco: Never Used  Substance Use Topics  . Alcohol use: Yes    Alcohol/week: 0.0 standard drinks    Comment: Occ  . Drug use: No     Family Hx: The patient's family history includes Asthma in his paternal grandmother; Heart disease in his father; Hypertension in his mother. There is no history of Colon cancer, Esophageal cancer, Stomach cancer, Rectal cancer, or Liver cancer.  ROS:   Please see the history of present illness.     All other systems reviewed and are negative.   Prior CV studies:   The following studies were reviewed today:  2D echocardiogram 1/20  Labs/Other Tests and Data Reviewed:    EKG:  An ECG dated 12/03/2018 was personally reviewed today and demonstrated:  Normal sinus rhythm  Recent Labs: 12/03/2018: BUN 15; Creatinine, Ser 1.16; Hemoglobin 14.2; Magnesium 2.1; Platelets 155; Potassium 4.6; Sodium 140   Recent Lipid Panel Lab Results  Component Value Date/Time   CHOL 137 06/18/2018 12:02 PM   CHOL 121 10/02/2017 10:11 AM   TRIG 217 (H) 06/18/2018 12:02 PM   HDL 46 06/18/2018 12:02 PM   HDL 46 10/02/2017 10:11 AM   CHOLHDL 3.0 06/18/2018 12:02 PM   LDLCALC 48 06/18/2018 12:02 PM   LDLCALC 50 10/02/2017 10:11 AM    Wt Readings from Last 3 Encounters:  07/02/18 176 lb (79.8 kg)  06/25/18 178 lb (80.7 kg)  06/18/18 177 lb 9.6 oz (80.6 kg)     Objective:    Vital Signs:  There were no vitals taken for this visit.   VITAL SIGNS:  reviewed a complete physical exam was not performed since this was a virtual telemedicine phone visit ASSESSMENT & PLAN:    1. Hyperlipidemia- history of hyperlipidemia on Crestor with lipid profile performed 06/18/2018 revealing total cholesterol 137, LDL 48 and HDL 46 2. Essential hypertension- history of essential hypertension with blood pressure measured today by the patient of 125/70 with a pulse of 67.  He  is on spironolactone and Entresto.  His carvedilol was recently discontinued 3. Coronary artery disease- history of CAD status post multiple PCI's in the past dating back to 1994 1995 of his LAD and circumflex.  His most recent procedure which I performed 12/26/2017 was notable for LAD stenting with demonstration of a widely patent RCA stent.  His EF at that time was 25%. 4. Persistent atrial fibrillation- history of persistent atrial fibrillation with successful cardioversion in the past which  was short-lived and ultimate cardioversion with Tikosyn which was successful and long-lasting.  His most recent EKG performed 12/03/2018 demonstrated sinus rhythm.  He is on Eliquis and Tikosyn. 5. Left ventricular dysfunction- history of left ventricular dysfunction with an EF previously demonstrated to be in the 25 to 30% range, and most recently in the 55 to 60% range by 2D echo 1/20 after pharmacologic and electrical cardioversion.  He is on Entresto and spironolactone.  His carvedilol was discontinued because of bradycardia and fatigue.  He feels clinically improved. 6. Carotid artery disease- history of right carotid endarterectomy performed by Dr. Curt Jews June 2013.  Recent carotid Dopplers performed 10/15/2018 revealed the right endarterectomy site to be widely patent with no evidence of ICA stenosis.  This will be repeated on an annual basis.  COVID-19 Education: The signs and symptoms of COVID-19 were discussed with the patient and how to seek care for testing (follow up with PCP or arrange E-visit).  The importance of social distancing was discussed today.  Time:   Today, I have spent 7 minutes with the patient with telehealth technology discussing the above problems.     Medication Adjustments/Labs and Tests Ordered: Current medicines are reviewed at length with the patient today.  Concerns regarding medicines are outlined above.   Tests Ordered: No orders of the defined types were placed in this  encounter.   Medication Changes: No orders of the defined types were placed in this encounter.   Follow Up:  In Person in 1 year(s)  Signed, [name], MD  12/31/2018 8:44 AM    [place]

## 2019-01-05 ENCOUNTER — Encounter: Payer: Self-pay | Admitting: Internal Medicine

## 2019-01-12 ENCOUNTER — Encounter: Payer: Self-pay | Admitting: Internal Medicine

## 2019-01-12 ENCOUNTER — Ambulatory Visit (INDEPENDENT_AMBULATORY_CARE_PROVIDER_SITE_OTHER): Payer: HMO | Admitting: Internal Medicine

## 2019-01-12 ENCOUNTER — Other Ambulatory Visit (INDEPENDENT_AMBULATORY_CARE_PROVIDER_SITE_OTHER): Payer: HMO

## 2019-01-12 ENCOUNTER — Other Ambulatory Visit: Payer: Self-pay

## 2019-01-12 VITALS — BP 144/60 | HR 75 | Temp 98.2°F | Resp 14 | Ht 69.0 in | Wt 179.0 lb

## 2019-01-12 DIAGNOSIS — E119 Type 2 diabetes mellitus without complications: Secondary | ICD-10-CM

## 2019-01-12 DIAGNOSIS — I10 Essential (primary) hypertension: Secondary | ICD-10-CM

## 2019-01-12 DIAGNOSIS — I4819 Other persistent atrial fibrillation: Secondary | ICD-10-CM | POA: Diagnosis not present

## 2019-01-12 DIAGNOSIS — L2084 Intrinsic (allergic) eczema: Secondary | ICD-10-CM | POA: Diagnosis not present

## 2019-01-12 LAB — TSH: TSH: 2.21 u[IU]/mL (ref 0.35–4.50)

## 2019-01-12 LAB — BASIC METABOLIC PANEL
BUN: 24 mg/dL — ABNORMAL HIGH (ref 6–23)
CO2: 29 mEq/L (ref 19–32)
Calcium: 9.5 mg/dL (ref 8.4–10.5)
Chloride: 104 mEq/L (ref 96–112)
Creatinine, Ser: 1.07 mg/dL (ref 0.40–1.50)
GFR: 65.77 mL/min (ref >60.00)
Glucose, Bld: 98 mg/dL (ref 70–99)
Potassium: 4.2 mEq/L (ref 3.5–5.1)
Sodium: 139 mEq/L (ref 135–145)

## 2019-01-12 LAB — URINALYSIS, ROUTINE W REFLEX MICROSCOPIC
Bilirubin Urine: NEGATIVE
Ketones, ur: NEGATIVE
Leukocytes,Ua: NEGATIVE
Nitrite: NEGATIVE
Specific Gravity, Urine: 1.015 (ref 1.000–1.030)
Total Protein, Urine: NEGATIVE
Urine Glucose: NEGATIVE
Urobilinogen, UA: 0.2 (ref 0.0–1.0)
pH: 5.5 (ref 5.0–8.0)

## 2019-01-12 LAB — HEMOGLOBIN A1C: Hgb A1c MFr Bld: 6.6 % — ABNORMAL HIGH (ref 4.6–6.5)

## 2019-01-12 LAB — MICROALBUMIN / CREATININE URINE RATIO
Creatinine,U: 66.7 mg/dL
Microalb Creat Ratio: 6.3 mg/g (ref 0.0–30.0)
Microalb, Ur: 4.2 mg/dL — ABNORMAL HIGH (ref 0.0–1.9)

## 2019-01-12 MED ORDER — LEVOCETIRIZINE DIHYDROCHLORIDE 5 MG PO TABS: 5.0000 mg | ORAL_TABLET | Freq: Every evening | ORAL | 1 refills | Status: DC

## 2019-01-12 NOTE — Patient Instructions (Signed)
Type 2 Diabetes Mellitus, Diagnosis, Adult Type 2 diabetes (type 2 diabetes mellitus) is a long-term (chronic) disease. In type 2 diabetes, one or both of these problems may be present:  The pancreas does not make enough of a hormone called insulin.  Cells in the body do not respond properly to insulin that the body makes (insulin resistance). Normally, insulin allows blood sugar (glucose) to enter cells in the body. The cells use glucose for energy. Insulin resistance or lack of insulin causes excess glucose to build up in the blood instead of going into cells. As a result, high blood glucose (hyperglycemia) develops. What increases the risk? The following factors may make you more likely to develop type 2 diabetes:  Having a family member with type 2 diabetes.  Being overweight or obese.  Having an inactive (sedentary) lifestyle.  Having been diagnosed with insulin resistance.  Having a history of prediabetes, gestational diabetes, or polycystic ovary syndrome (PCOS).  Being of American-Indian, African-American, Hispanic/Latino, or Asian/Pacific Islander descent. What are the signs or symptoms? In the early stage of this condition, you may not have symptoms. Symptoms develop slowly and may include:  Increased thirst (polydipsia).  Increased hunger(polyphagia).  Increased urination (polyuria).  Increased urination during the night (nocturia).  Unexplained weight loss.  Frequent infections that keep coming back (recurring).  Fatigue.  Weakness.  Vision changes, such as blurry vision.  Cuts or bruises that are slow to heal.  Tingling or numbness in the hands or feet.  Dark patches on the skin (acanthosis nigricans). How is this diagnosed? This condition is diagnosed based on your symptoms, your medical history, a physical exam, and your blood glucose level. Your blood glucose may be checked with one or more of the following blood tests:  A fasting blood glucose (FBG)  test. You will not be allowed to eat (you will fast) for 8 hours or longer before a blood sample is taken.  A random blood glucose test. This test checks blood glucose at any time of day regardless of when you ate.  An A1c (hemoglobin A1c) blood test. This test provides information about blood glucose control over the previous 2-3 months.  An oral glucose tolerance test (OGTT). This test measures your blood glucose at two times: ? After fasting. This is your baseline blood glucose level. ? Two hours after drinking a beverage that contains glucose. You may be diagnosed with type 2 diabetes if:  Your FBG level is 126 mg/dL (7.0 mmol/L) or higher.  Your random blood glucose level is 200 mg/dL (11.1 mmol/L) or higher.  Your A1c level is 6.5% or higher.  Your OGTT result is higher than 200 mg/dL (11.1 mmol/L). These blood tests may be repeated to confirm your diagnosis. How is this treated? Your treatment may be managed by a specialist called an endocrinologist. Type 2 diabetes may be treated by following instructions from your health care provider about:  Making diet and lifestyle changes. This may include: ? Following an individualized nutrition plan that is developed by a diet and nutrition specialist (registered dietitian). ? Exercising regularly. ? Finding ways to manage stress.  Checking your blood glucose level as often as told.  Taking diabetes medicines or insulin daily. This helps to keep your blood glucose levels in the healthy range. ? If you use insulin, you may need to adjust the dosage depending on how physically active you are and what foods you eat. Your health care provider will tell you how to adjust your dosage.    Taking medicines to help prevent complications from diabetes, such as: ? Aspirin. ? Medicine to lower cholesterol. ? Medicine to control blood pressure. Your health care provider will set individualized treatment goals for you. Your goals will be based on  your age, other medical conditions you have, and how you respond to diabetes treatment. Generally, the goal of treatment is to maintain the following blood glucose levels:  Before meals (preprandial): 80-130 mg/dL (4.4-7.2 mmol/L).  After meals (postprandial): below 180 mg/dL (10 mmol/L).  A1c level: less than 7%. Follow these instructions at home: Questions to ask your health care provider  Consider asking the following questions: ? Do I need to meet with a diabetes educator? ? Where can I find a support group for people with diabetes? ? What equipment will I need to manage my diabetes at home? ? What diabetes medicines do I need, and when should I take them? ? How often do I need to check my blood glucose? ? What number can I call if I have questions? ? When is my next appointment? General instructions  Take over-the-counter and prescription medicines only as told by your health care provider.  Keep all follow-up visits as told by your health care provider. This is important.  For more information about diabetes, visit: ? American Diabetes Association (ADA): www.diabetes.org ? American Association of Diabetes Educators (AADE): www.diabeteseducator.org Contact a health care provider if:  Your blood glucose is at or above 240 mg/dL (13.3 mmol/L) for 2 days in a row.  You have been sick or have had a fever for 2 days or longer, and you are not getting better.  You have any of the following problems for more than 6 hours: ? You cannot eat or drink. ? You have nausea and vomiting. ? You have diarrhea. Get help right away if:  Your blood glucose is lower than 54 mg/dL (3.0 mmol/L).  You become confused or you have trouble thinking clearly.  You have difficulty breathing.  You have moderate or large ketone levels in your urine. Summary  Type 2 diabetes (type 2 diabetes mellitus) is a long-term (chronic) disease. In type 2 diabetes, the pancreas does not make enough of a  hormone called insulin, or cells in the body do not respond properly to insulin that the body makes (insulin resistance).  This condition is treated by making diet and lifestyle changes and taking diabetes medicines or insulin.  Your health care provider will set individualized treatment goals for you. Your goals will be based on your age, other medical conditions you have, and how you respond to diabetes treatment.  Keep all follow-up visits as told by your health care provider. This is important. This information is not intended to replace advice given to you by your health care provider. Make sure you discuss any questions you have with your health care provider. Document Released: 05/14/2005 Document Revised: 07/12/2017 Document Reviewed: 06/17/2015 Elsevier Patient Education  2020 Elsevier Inc.  

## 2019-01-12 NOTE — Progress Notes (Addendum)
Subjective:  Patient ID: Dillon Young, male    DOB: 04-07-1933  Age: 85 y.o. MRN: [id number]  CC: Hypertension, Diabetes, and Rash   HPI Dillon Young presents for f/up - He complains of a several month history of itchy rash on the back of his neck.  He tells me that he saw an ENT physician and was told that it is eczema.  It sounds like he is using topical fluocinonide to treat it.  He still complains of itching.  He is not taking any oral antihistamines.  Outpatient Medications Prior to Visit  Medication Sig Dispense Refill  . cholecalciferol (VITAMIN D) 1000 UNITS tablet Take 1,000 Units by mouth at bedtime.     . dofetilide (TIKOSYN) 125 MCG capsule Take 1 capsule (125 mcg total) by mouth 2 (two) times daily. 180 capsule 2  . ELIQUIS 5 MG TABS tablet TAKE 1 TABLET BY MOUTH TWICE A DAY 180 tablet 1  . fluticasone (FLONASE) 50 MCG/ACT nasal spray Place 2 sprays into both nostrils daily. 16 g 1  . Hypromellose (ARTIFICIAL TEARS OP) Place 1 drop into both eyes 4 (four) times daily as needed (dry eyes).    Marland Kitchen ipratropium (ATROVENT) 0.06 % nasal spray Place 2 sprays into both nostrils 4 (four) times daily. 15 mL 12  . nitroGLYCERIN (NITROSTAT) 0.4 MG SL tablet Place 1 tablet (0.4 mg total) under the tongue every 5 (five) minutes as needed for chest pain. 25 tablet 3  . rosuvastatin (CRESTOR) 10 MG tablet Take 1 tablet (10 mg total) by mouth daily. NEEDS APPT FOR FUTURE REFILLS 90 tablet 0  . sacubitril-valsartan (ENTRESTO) 97-103 MG Take 1 tablet by mouth 2 (two) times daily. 60 tablet 6  . spironolactone (ALDACTONE) 25 MG tablet TAKE 1 TABLET BY MOUTH EVERY DAY 90 tablet 1  . terbinafine (LAMISIL) 250 MG tablet Take 250 mg by mouth daily.    Marland Kitchen triamcinolone cream (KENALOG) 0.1 %     . hydrocortisone (ANUSOL-HC) 2.5 % rectal cream Place 1 application rectally 2 (two) times daily as needed for hemorrhoids.     . Melatonin 10 MG TABS Take 10 mg by mouth at bedtime as needed (for sleep).      . vitamin C (ASCORBIC ACID) 500 MG tablet Take 500 mg by mouth at bedtime.     . Wheat Dextrin (BENEFIBER PO) Take 2 Scoops by mouth daily.      No facility-administered medications prior to visit.     ROS Review of Systems  Constitutional: Negative.  Negative for appetite change, diaphoresis, fatigue and unexpected weight change.  HENT: Negative.   Eyes: Negative for visual disturbance.  Respiratory: Negative for cough, chest tightness, shortness of breath and wheezing.   Cardiovascular: Negative for chest pain, palpitations and leg swelling.  Gastrointestinal: Negative for abdominal pain, constipation, diarrhea, nausea and vomiting.  Endocrine: Negative.  Negative for polydipsia, polyphagia and polyuria.  Genitourinary: Negative.  Negative for difficulty urinating, dysuria and hematuria.  Musculoskeletal: Negative.  Negative for arthralgias and myalgias.  Skin: Positive for rash.  Neurological: Negative.  Negative for dizziness, weakness and light-headedness.  Hematological: Negative for adenopathy. Does not bruise/bleed easily.  Psychiatric/Behavioral: Negative.     Objective:  BP (!) 144/60   Pulse 75   Temp 98.2 F (36.8 C) (Temporal)   Resp 14   Ht 5\' 9"  (1.753 m)   Wt 179 lb (81.2 kg)   SpO2 96%   BMI 26.43 kg/m   BP Readings from  Last 3 Encounters:  01/12/19 (!) 144/60  12/31/18 125/70  06/18/18 (!) 160/62    Wt Readings from Last 3 Encounters:  01/12/19 179 lb (81.2 kg)  12/31/18 174 lb (78.9 kg)  07/02/18 176 lb (79.8 kg)    Physical Exam Vitals signs reviewed.  Constitutional:      Appearance: He is not ill-appearing or diaphoretic.  HENT:     Nose: Nose normal. No congestion.     Mouth/Throat:     Mouth: Mucous membranes are moist.  Eyes:     Conjunctiva/sclera: Conjunctivae normal.  Neck:     Musculoskeletal: Normal range of motion and neck supple. No neck rigidity.  Cardiovascular:     Rate and Rhythm: Normal rate and regular rhythm.      Heart sounds: No murmur. No gallop.   Pulmonary:     Effort: Pulmonary effort is normal. No respiratory distress.     Breath sounds: No stridor. No wheezing, rhonchi or rales.  Abdominal:     General: Abdomen is flat. Bowel sounds are normal. There is no distension.     Palpations: There is no hepatomegaly or splenomegaly.     Tenderness: There is no abdominal tenderness.  Musculoskeletal: Normal range of motion.     Right lower leg: No edema.     Left lower leg: No edema.  Skin:    General: Skin is warm and dry.     Findings: Rash present. No abrasion, ecchymosis, erythema or petechiae. Rash is macular, papular and scaling. Rash is not crusting, nodular, purpuric, pustular, urticarial or vesicular.     Comments: There are blanching erythematous macules across the trunk.  Over the posterior neck, symmetrically and bilaterally there are fleshy papules with scale.  The papules have coalesced.  Neurological:     General: No focal deficit present.     Mental Status: He is alert.  Psychiatric:        Mood and Affect: Mood normal.        Behavior: Behavior normal.     Lab Results  Component Value Date   WBC 4.9 12/03/2018   HGB 14.2 12/03/2018   HCT 44.6 12/03/2018   PLT 155 12/03/2018   GLUCOSE 98 01/12/2019   CHOL 137 06/18/2018   TRIG 217 (H) 06/18/2018   HDL 46 06/18/2018   LDLCALC 48 06/18/2018   ALT 13 10/02/2017   AST 17 10/02/2017   NA 139 01/12/2019   K 4.2 01/12/2019   CL 104 01/12/2019   CREATININE 1.07 01/12/2019   BUN 24 (H) 01/12/2019   CO2 29 01/12/2019   TSH 2.21 01/12/2019   PSA 0.015 09/09/2017   INR 1.1 12/18/2017   HGBA1C 6.6 (H) 01/12/2019   MICROALBUR 4.2 (H) 01/12/2019    No results found.  Assessment & Plan:   Jonavin was seen today for hypertension, diabetes and rash.  Diagnoses and all orders for this visit:  Essential hypertension- His blood pressure is adequately well controlled. -     Basic metabolic panel; Future -     Urinalysis,  Routine w reflex microscopic; Future  Persistent atrial fibrillation- He is maintaining sinus rhythm.  Will continue anticoagulation with the NOAC. -     TSH; Future  Diabetes mellitus without complication ([place])- His B3Z is at 6.6%.  His blood sugars are adequately well controlled. -     HM Diabetes Foot Exam -     Basic metabolic panel; Future -     Hemoglobin A1c; Future -  Microalbumin / creatinine urine ratio; Future -     Urinalysis, Routine w reflex microscopic; Future  Intrinsic eczema -     levocetirizine (XYZAL) 5 MG tablet; Take 1 tablet (5 mg total) by mouth every evening.   I have discontinued Princess Bruins. Davidian "Bill"'s vitamin C, hydrocortisone, Wheat Dextrin (BENEFIBER PO), and Melatonin. I am also having him start on levocetirizine. Additionally, I am having him maintain his cholecalciferol, Hypromellose (ARTIFICIAL TEARS OP), nitroGLYCERIN, ipratropium, fluticasone, sacubitril-valsartan, rosuvastatin, Eliquis, dofetilide, spironolactone, terbinafine, and triamcinolone cream.  Meds ordered this encounter  Medications  . levocetirizine (XYZAL) 5 MG tablet    Sig: Take 1 tablet (5 mg total) by mouth every evening.    Dispense:  90 tablet    Refill:  1     Follow-up: Return in about 6 months (around 07/15/2019).  [name], MD

## 2019-01-29 ENCOUNTER — Other Ambulatory Visit: Payer: Self-pay | Admitting: Cardiovascular Disease

## 2019-02-11 DIAGNOSIS — M79675 Pain in left toe(s): Secondary | ICD-10-CM | POA: Diagnosis not present

## 2019-02-11 DIAGNOSIS — B351 Tinea unguium: Secondary | ICD-10-CM | POA: Diagnosis not present

## 2019-02-11 DIAGNOSIS — L600 Ingrowing nail: Secondary | ICD-10-CM | POA: Diagnosis not present

## 2019-02-11 DIAGNOSIS — M79674 Pain in right toe(s): Secondary | ICD-10-CM | POA: Diagnosis not present

## 2019-02-23 ENCOUNTER — Other Ambulatory Visit: Payer: Self-pay

## 2019-02-23 NOTE — Patient Outreach (Signed)
  [facility]) Care Management Chronic Special Needs Program  02/23/2019  Name: ZACHORY HATZ DOB: 1932-09-14  MRN: [id number]  Mr. Greogory Slaby is enrolled in a chronic special needs plan for Heart Failure. Reviewed and updated care plan.  Subjective: Client states that he has been feeling good this summer.  States he has been walking about 30 minutes at least 4 times a week.  States he is weighting daily and he keeps his weight between 175-178 most of the time.  Denies any chest pains or SOB  Goals Addressed            This Visit's Progress   . Client understands the importance of follow-up with providers by attending scheduled visits   On track   . COMPLETED: Client will report abillity to obtain Medications within 3 months        Enrolled in pharmacy assistance programs    . Client will report no worsening of symptoms of Atrial Fibrillation within the next 6 months(continue 02/23/19)   On track   . Client will report weighing and recording weights daily for the next 6 months(continue 02/23/19)   On track   . Client will verbalize knowledge of self management of Hypertension as evidences by BP reading of 140/90 or less; or as defined by provider   On track   . Decrease inpatient Heart Failure admissions/ readmissions with in the year   On track   . Decrease the use of hospital emergency department related to heart failure within the next year   On track   . Maintain timely refills of Heart Failure medication as prescribed within the year    On track   . COMPLETED: Obtain annual  Lipid Profile, LDL-C       Completed 06/16/18    . COMPLETED: Visit Primary Care Provider or Cardiologist at least 2 times per year       Completed 06/18/18, 12/01/18     Client has been weighing daily and verbalizes s/s of HF to call MD.  Client is approved for pharmacy assistance programs.  Reinforced s/s of HF to call MD Reinforced to follow a low sodium diet  Encouraged to continue to  exercise regularly Reviewed number for 24-hour nurse Line Reviewed COVID 19 precautions  Plan:  Send successful outreach letter with a copy of their individualized care plan and Send individual care plan to provider  Chronic care management coordinator will outreach in:  4-6 Months    [place], [facility], [place] Management Coordinator [place] Management [phone]

## 2019-02-27 ENCOUNTER — Ambulatory Visit (INDEPENDENT_AMBULATORY_CARE_PROVIDER_SITE_OTHER): Payer: HMO

## 2019-02-27 ENCOUNTER — Other Ambulatory Visit: Payer: Self-pay

## 2019-02-27 DIAGNOSIS — Z23 Encounter for immunization: Secondary | ICD-10-CM | POA: Diagnosis not present

## 2019-03-10 ENCOUNTER — Other Ambulatory Visit: Payer: Self-pay | Admitting: Pharmacist

## 2019-03-10 NOTE — Patient Outreach (Signed)
[facility]) Care Management  03/10/2019  Dillon Young 07/15/1932 [id number]   Patient was called for CSNP follow up. HIPAA identifiers were obtained.   Patient is an 85 year old male with multiple medical conditions including but not limited to: allergic rhinitis, aortic atherosclerosis, bradycardia, CAD, constipation, type 2 diabetes, hypertension, GERD, history of prostate cancer, hyperlipidemia, A Fib, and osteoarthritis of both knees.   Most recent HgA1c-6.6% 12/2018 On statin-rosuvastatin last filled 01/29/2019 for 90 day supply.  Patient communicated understating on how to reorder Entresto and Eliquis from the respective patient assistance programs.  Patient's medications were reviewed:  Medications Reviewed Today    Reviewed by [name], [facility] (Pharmacist) on 03/10/19 at [place] List Status: <None>  Medication Order Taking? Sig Documenting Provider Last Dose Status Informant  cholecalciferol (VITAMIN D) 1000 UNITS tablet [id number] Yes Take 1,000 Units by mouth at bedtime.  [provider] Taking Active Self  dofetilide (TIKOSYN) 125 MCG capsule [id number] Yes Take 1 capsule (125 mcg total) by mouth 2 (two) times daily. Bensimhon, Shaune Pascal, MD Taking Active   ELIQUIS 5 MG TABS tablet [id number] Yes TAKE 1 TABLET BY MOUTH TWICE A DAY [name], MD Taking Active   fluticasone [facility]) 50 MCG/ACT nasal spray [id number] Yes Place 2 sprays into both nostrils daily. Luetta Nutting, DO Taking Active   Hypromellose (ARTIFICIAL TEARS OP) [id number] Yes Place 1 drop into both eyes 4 (four) times daily as needed (dry eyes). [provider] Taking Active Self  ipratropium (ATROVENT) 0.06 % nasal spray [id number] Yes Place 2 sprays into both nostrils 4 (four) times daily. Marrian Salvage, FNP Taking Active   levocetirizine (XYZAL) 5 MG tablet [id number] Yes Take 1 tablet (5 mg total) by mouth every evening. [name], MD Taking Active    nitroGLYCERIN (NITROSTAT) 0.4 MG SL tablet [id number] Yes Place 1 tablet (0.4 mg total) under the tongue every 5 (five) minutes as needed for chest pain. Barrett, Evelene Croon, PA-C Taking Active Self  rosuvastatin (CRESTOR) 10 MG tablet [id number] Yes TAKE 1 TABLET (10 MG TOTAL) BY MOUTH DAILY. NEEDS APPT FOR FUTURE REFILLS [name], MD Taking Active   sacubitril-valsartan [facility]) 97-103 MG [id number] Yes Take 1 tablet by mouth 2 (two) times daily. Larey Dresser, MD Taking Active   spironolactone (ALDACTONE) 25 MG tablet [id number] Yes TAKE 1 TABLET BY MOUTH EVERY DAY Larey Dresser, MD Taking Active   terbinafine (LAMISIL) 250 MG tablet [id number] Yes Take 250 mg by mouth daily. [provider] Taking Active   triamcinolone cream (KENALOG) 0.1 % [id number] Yes  [provider] Taking Active           Plan: Follow up with patient in 3 months.  [name], PharmD, [place] Clinical Pharmacist [phone]

## 2019-03-11 DIAGNOSIS — B351 Tinea unguium: Secondary | ICD-10-CM | POA: Diagnosis not present

## 2019-03-11 DIAGNOSIS — M79672 Pain in left foot: Secondary | ICD-10-CM | POA: Diagnosis not present

## 2019-03-11 DIAGNOSIS — M79671 Pain in right foot: Secondary | ICD-10-CM | POA: Diagnosis not present

## 2019-03-11 DIAGNOSIS — M792 Neuralgia and neuritis, unspecified: Secondary | ICD-10-CM | POA: Diagnosis not present

## 2019-03-11 DIAGNOSIS — L600 Ingrowing nail: Secondary | ICD-10-CM | POA: Diagnosis not present

## 2019-03-15 ENCOUNTER — Other Ambulatory Visit (HOSPITAL_COMMUNITY): Payer: Self-pay | Admitting: Cardiology

## 2019-03-23 ENCOUNTER — Other Ambulatory Visit: Payer: Self-pay | Admitting: Pharmacist

## 2019-03-23 NOTE — Patient Outreach (Signed)
[facility]) Care Management  03/23/2019  Dillon Young 1933-03-03 [id number]   Patient called me over the weekend and left a message requesting a call back. Patient was called back today. HIPAA identifiers were obtained. Patient said he had already settled the issue he called me about by the time of my call.  Plan: Follow up with the patient at our previously scheduled call back.  [name], PharmD, [place] Clinical Pharmacist [phone]

## 2019-04-08 DIAGNOSIS — L600 Ingrowing nail: Secondary | ICD-10-CM | POA: Diagnosis not present

## 2019-04-08 DIAGNOSIS — B351 Tinea unguium: Secondary | ICD-10-CM | POA: Diagnosis not present

## 2019-04-13 ENCOUNTER — Other Ambulatory Visit (HOSPITAL_COMMUNITY): Payer: Self-pay | Admitting: *Deleted

## 2019-04-13 ENCOUNTER — Other Ambulatory Visit: Payer: Self-pay | Admitting: Pharmacist

## 2019-04-13 MED ORDER — DOFETILIDE 125 MCG PO CAPS: 125.0000 ug | ORAL_CAPSULE | Freq: Two times a day (BID) | ORAL | 2 refills | Status: DC

## 2019-04-13 NOTE — Telephone Encounter (Signed)
Pt left VM requesting a refill pt did not leave name of medication. Called patient back no answer /vm full.

## 2019-04-13 NOTE — Patient Outreach (Signed)
[facility]) Care Management  04/13/2019  RIYAZ CIAK 05-18-1933 [id number]   Patient called and said he needs a refill on dofetilitde. HIPAA identifiers were obtained. He was instructed to call RX Outreach for a refill last week. He said he called and was told he needs refills and that [facility] had faxed his provider's office last week but had not heard back.  Patient wondered if I could authorize him a refill.  It was explained that only his provider could authorize a refill. He was encouraged to call their office. He said he had tried to call multiple times without success.  A message was left on the nurse line at Dr. Belenda Cruise office about the patient needing a refill sent to [facility] Outreach.  Plan: Follow up with patient at regularly scheduled follow up.  [name], PharmD, [place] Clinical Pharmacist [phone]

## 2019-04-16 ENCOUNTER — Other Ambulatory Visit: Payer: Self-pay

## 2019-04-16 MED ORDER — SACUBITRIL-VALSARTAN 97-103 MG PO TABS: 1.0000 | ORAL_TABLET | Freq: Two times a day (BID) | ORAL | 6 refills | Status: DC

## 2019-04-17 ENCOUNTER — Other Ambulatory Visit: Payer: Self-pay

## 2019-04-21 ENCOUNTER — Telehealth: Payer: Self-pay | Admitting: Internal Medicine

## 2019-04-21 MED ORDER — HYDROCORTISONE (PERIANAL) 2.5 % EX CREA: 1.0000 "application " | TOPICAL_CREAM | Freq: Two times a day (BID) | CUTANEOUS | 1 refills | Status: DC | PRN

## 2019-05-13 NOTE — Telephone Encounter (Signed)
Refilled Anusol HC for hemorrhoids

## 2019-05-27 ENCOUNTER — Encounter: Payer: Self-pay | Admitting: Internal Medicine

## 2019-05-27 DIAGNOSIS — L600 Ingrowing nail: Secondary | ICD-10-CM | POA: Diagnosis not present

## 2019-05-27 DIAGNOSIS — D485 Neoplasm of uncertain behavior of skin: Secondary | ICD-10-CM | POA: Diagnosis not present

## 2019-05-27 DIAGNOSIS — M79675 Pain in left toe(s): Secondary | ICD-10-CM | POA: Diagnosis not present

## 2019-05-27 DIAGNOSIS — M79674 Pain in right toe(s): Secondary | ICD-10-CM | POA: Diagnosis not present

## 2019-05-27 DIAGNOSIS — B351 Tinea unguium: Secondary | ICD-10-CM | POA: Diagnosis not present

## 2019-06-07 ENCOUNTER — Ambulatory Visit: Payer: Medicare Other | Attending: Internal Medicine

## 2019-06-07 DIAGNOSIS — Z23 Encounter for immunization: Secondary | ICD-10-CM

## 2019-06-07 NOTE — Progress Notes (Signed)
   Covid-19 Vaccination Clinic  Name:  Dillon Young    MRN: [id number] DOB: 07-02-1932  06/07/2019  [name] was observed post Covid-19 immunization for 30 minutes based on pre-vaccination screening without incidence. He was provided with Vaccine Information Sheet and instruction to access the V-Safe system.   [name] was instructed to call 911 with any severe reactions post vaccine: Marland Kitchen Difficulty breathing  . Swelling of your face and throat  . A fast heartbeat  . A bad rash all over your body  . Dizziness and weakness    Immunizations Administered    Name Date Dose VIS Date Route   Pfizer COVID-19 Vaccine 06/07/2019 10:54 AM 0.3 mL 05/08/2019 Intramuscular   Manufacturer: [place]   Lot: [id number]   [place]: [id number]

## 2019-06-10 ENCOUNTER — Other Ambulatory Visit: Payer: Self-pay | Admitting: Pharmacist

## 2019-06-10 ENCOUNTER — Ambulatory Visit: Payer: Self-pay | Admitting: Pharmacist

## 2019-06-10 DIAGNOSIS — D229 Melanocytic nevi, unspecified: Secondary | ICD-10-CM | POA: Diagnosis not present

## 2019-06-10 DIAGNOSIS — D485 Neoplasm of uncertain behavior of skin: Secondary | ICD-10-CM | POA: Diagnosis not present

## 2019-06-10 NOTE — Patient Outreach (Signed)
[facility]) Care Management  06/10/2019  ROCKY VITALI 1932-08-20 [id number]   Patient was called for CNSP follow up. Unfortunately, he did not answer either number listed in his chart. A HIPAA compliant message was left with a male who answered his home phone. The voicemail box on his mobile number was full.  Plan: Send patient an unsuccessful outreach letter Call patient back in 6-8 weeks.

## 2019-06-24 DIAGNOSIS — M79675 Pain in left toe(s): Secondary | ICD-10-CM | POA: Diagnosis not present

## 2019-06-24 DIAGNOSIS — M79674 Pain in right toe(s): Secondary | ICD-10-CM | POA: Diagnosis not present

## 2019-06-24 DIAGNOSIS — L600 Ingrowing nail: Secondary | ICD-10-CM | POA: Diagnosis not present

## 2019-06-25 ENCOUNTER — Other Ambulatory Visit: Payer: Self-pay

## 2019-06-25 NOTE — Patient Outreach (Signed)
[facility]) Care Management Chronic Special Needs Program  06/25/2019  Name: Dillon Young DOB: 26-Mar-1933  MRN: [id number]  Mr. Kairee Amici is enrolled in a chronic special needs plan for Heart Failure. Chronic Care Management Coordinator telephoned client to review health risk assessment and to develop individualized care plan.  Reviewed the chronic care management program, importance of client participation, and taking their care plan to all provider appointments and inpatient facilities.  Reviewed the transition of care process and possible referral to community care management.  Subjective: Client states that he has been feeling good.  States he has been weighting daily and writing his weights down.  States his weight is usually around 178 give or take a pound or so.  Denies any chest pains or shortness of breath.  Denies any issues with his atrial fibrillation.  States he is walking 30 minutes a day either at his church or at the [facility].  States he received his first [place] vaccination on 06/10/19 and he will get his second shot on 06/28/19.  States he is affording his medications since he is getting help from the drug companies. States his B/P is usually from 120-130/ 70-80 at his checks at home.    Goals Addressed            This Visit's Progress   . Client understands the importance of follow-up with providers by attending scheduled visits   On track    Plan to keep scheduled appointments with providers    . Client will report no worsening of symptoms of Atrial Fibrillation within the next 6 months(continue 06/25/19)   On track    Reports no worsening of atrial fibrillation Plan to take anticoagulant as ordered to prevent stroke    . Client will report weighing and recording weights daily for the next 6 months(continue 06/25/19)   On track    Reports weighing daily and recording Plan to weigh daily and record weight Plan to follow a low sodium/salt heart healthy  diet    . Client will verbalize knowledge of self management of Hypertension as evidences by BP reading of 140/90 or less; or as defined by provider   On track    Reports B/P less than 140/90 when checking at home and at provider visits Plan to check B/P regularly Take B/P medications as ordered Plan to follow a low salt diet  Plan to continue to exercise daily     . Decrease inpatient Heart Failure admissions/ readmissions with in the year   On track    No admissions last year    . Decrease the use of hospital emergency department related to heart failure within the next year   On track    No ED visits in 2020    . Maintain timely refills of Heart Failure medication as prescribed within the year    On track    Maintaining timely refills per dispense report    . Obtain annual  Lipid Profile, LDL-C   On track    Plan to try to avoid saturated fats and trans-fats    . Visit Primary Care Provider or Cardiologist at least 2 times per year   On track    Plan to keep scheduled appointment with primary care provider on 07/15/19      Client reports weighting daily and with no issues at this time Client is active with [employer] (medical) for pharmacy assistance Verbalizes good teach back of heart failure zones  and when to call MD Reinforced to follow a hearth healthy low sodium diet Encouraged to continue to exercise regularly as tolerated Reviewed number for 24-hour nurse Line Reviewed COVID 19 precautions Reinforced to get his second COVID vaccination on 06/28/19  Plan:  Send successful outreach letter with a copy of their individualized care plan, Send individual care plan to provider and Send educational material  Chronic care management coordination will outreach in:  6 Months     Peter Garter RN, [employer], [place] Management Coordinator [place] Management [phone]

## 2019-06-28 ENCOUNTER — Ambulatory Visit: Payer: HMO | Attending: Internal Medicine

## 2019-06-28 DIAGNOSIS — Z23 Encounter for immunization: Secondary | ICD-10-CM | POA: Insufficient documentation

## 2019-06-28 NOTE — Progress Notes (Signed)
   Covid-19 Vaccination Clinic  Name:  Dillon Young    MRN: [id number] DOB: Oct 09, 1932  06/28/2019  [name] was observed post Covid-19 immunization for 15 minutes without incidence. He was provided with Vaccine Information Sheet and instruction to access the V-Safe system.   [name] was instructed to call 911 with any severe reactions post vaccine: Marland Kitchen Difficulty breathing  . Swelling of your face and throat  . A fast heartbeat  . A bad rash all over your body  . Dizziness and weakness    Immunizations Administered    Name Date Dose VIS Date Route   Pfizer COVID-19 Vaccine 06/28/2019  9:35 AM 0.3 mL 05/08/2019 Intramuscular   Manufacturer: [place]   Lot: [id number]   [place]: [id number]

## 2019-07-09 ENCOUNTER — Other Ambulatory Visit: Payer: Self-pay | Admitting: Internal Medicine

## 2019-07-09 DIAGNOSIS — L2084 Intrinsic (allergic) eczema: Secondary | ICD-10-CM

## 2019-07-14 ENCOUNTER — Other Ambulatory Visit: Payer: Self-pay | Admitting: Pharmacist

## 2019-07-15 ENCOUNTER — Encounter: Payer: Self-pay | Admitting: Internal Medicine

## 2019-07-15 ENCOUNTER — Ambulatory Visit (INDEPENDENT_AMBULATORY_CARE_PROVIDER_SITE_OTHER): Payer: HMO | Admitting: Internal Medicine

## 2019-07-15 ENCOUNTER — Other Ambulatory Visit: Payer: Self-pay

## 2019-07-15 VITALS — BP 144/60 | HR 82 | Temp 98.1°F | Ht 69.0 in | Wt 179.0 lb

## 2019-07-15 DIAGNOSIS — E785 Hyperlipidemia, unspecified: Secondary | ICD-10-CM

## 2019-07-15 DIAGNOSIS — I10 Essential (primary) hypertension: Secondary | ICD-10-CM | POA: Diagnosis not present

## 2019-07-15 DIAGNOSIS — I4819 Other persistent atrial fibrillation: Secondary | ICD-10-CM | POA: Diagnosis not present

## 2019-07-15 DIAGNOSIS — Z0000 Encounter for general adult medical examination without abnormal findings: Secondary | ICD-10-CM | POA: Diagnosis not present

## 2019-07-15 DIAGNOSIS — E119 Type 2 diabetes mellitus without complications: Secondary | ICD-10-CM | POA: Diagnosis not present

## 2019-07-15 DIAGNOSIS — C61 Malignant neoplasm of prostate: Secondary | ICD-10-CM

## 2019-07-15 LAB — BASIC METABOLIC PANEL
BUN: 21 mg/dL (ref 6–23)
CO2: 27 mEq/L (ref 19–32)
Calcium: 9.2 mg/dL (ref 8.4–10.5)
Chloride: 106 mEq/L (ref 96–112)
Creatinine, Ser: 1.14 mg/dL (ref 0.40–1.50)
GFR: 61.06 mL/min (ref >60.00)
Glucose, Bld: 109 mg/dL — ABNORMAL HIGH (ref 70–99)
Potassium: 4.2 mEq/L (ref 3.5–5.1)
Sodium: 143 mEq/L (ref 135–145)

## 2019-07-15 LAB — CBC WITH DIFFERENTIAL/PLATELET
Basophils Absolute: 0 10*3/uL (ref 0.0–0.1)
Basophils Relative: 0.8 % (ref 0.0–3.0)
Eosinophils Absolute: 0.1 10*3/uL (ref 0.0–0.7)
Eosinophils Relative: 2 % (ref 0.0–5.0)
HCT: 39.2 % (ref 39.0–52.0)
Hemoglobin: 12.8 g/dL — ABNORMAL LOW (ref 13.0–17.0)
Lymphocytes Relative: 19.4 % (ref 12.0–46.0)
Lymphs Abs: 0.8 10*3/uL (ref 0.7–4.0)
MCHC: 32.6 g/dL (ref 30.0–36.0)
MCV: 79.7 fl (ref 78.0–100.0)
Monocytes Absolute: 0.5 10*3/uL (ref 0.1–1.0)
Monocytes Relative: 10.4 % (ref 3.0–12.0)
Neutro Abs: 2.9 10*3/uL (ref 1.4–7.7)
Neutrophils Relative %: 67.4 % (ref 43.0–77.0)
Platelets: 156 10*3/uL (ref 150.0–400.0)
RBC: 4.92 Mil/uL (ref 4.22–5.81)
RDW: 15.4 % (ref 11.5–15.5)
WBC: 4.3 10*3/uL (ref 4.0–10.5)

## 2019-07-15 LAB — LIPID PANEL
Cholesterol: 113 mg/dL (ref 0–200)
HDL: 46.7 mg/dL (ref >39.00)
LDL Cholesterol: 35 mg/dL (ref 0–99)
NonHDL: 65.83
Total CHOL/HDL Ratio: 2
Triglycerides: 156 mg/dL — ABNORMAL HIGH (ref 0.0–149.0)
VLDL: 31.2 mg/dL (ref 0.0–40.0)

## 2019-07-15 LAB — HEMOGLOBIN A1C: Hgb A1c MFr Bld: 6.1 % (ref 4.6–6.5)

## 2019-07-15 LAB — HEPATIC FUNCTION PANEL
ALT: 8 U/L (ref 0–53)
AST: 14 U/L (ref 0–37)
Albumin: 4.3 g/dL (ref 3.5–5.2)
Alkaline Phosphatase: 51 U/L (ref 39–117)
Bilirubin, Direct: 0.1 mg/dL (ref 0.0–0.3)
Total Bilirubin: 0.4 mg/dL (ref 0.2–1.2)
Total Protein: 6.7 g/dL (ref 6.0–8.3)

## 2019-07-15 LAB — PSA: PSA: 0 ng/mL — ABNORMAL LOW (ref 0.10–4.00)

## 2019-07-15 NOTE — Progress Notes (Signed)
Subjective:  Patient ID: OTIE KALLENBACH, male    DOB: 1932/06/05  Age: 86 y.o. MRN: [id number]  CC: Annual Exam, Hyperlipidemia, Diabetes, and Hypertension   This visit occurred during the SARS-CoV-2 public health emergency.  Safety protocols were in place, including screening questions prior to the visit, additional usage of staff PPE, and extensive cleaning of exam room while observing appropriate contact time as indicated for disinfecting solutions.    HPI TREAVOR UN presents for a CPX.  He denies any recent episodes of palpitations, chest pain, shortness of breath, diaphoresis, edema, or fatigue.  He tells me his blood pressure has been well controlled.  He is working on his lifestyle modifications to control his blood sugars and he says his blood sugars have been normal recently.  Outpatient Medications Prior to Visit  Medication Sig Dispense Refill  . cholecalciferol (VITAMIN D) 1000 UNITS tablet Take 1,000 Units by mouth at bedtime.     . dofetilide (TIKOSYN) 125 MCG capsule Take 1 capsule (125 mcg total) by mouth 2 (two) times daily. 180 capsule 2  . ELIQUIS 5 MG TABS tablet TAKE 1 TABLET BY MOUTH TWICE A DAY 180 tablet 1  . fexofenadine (ALLEGRA) 180 MG tablet Take 180 mg by mouth daily.    . fluticasone (FLONASE) 50 MCG/ACT nasal spray Place 2 sprays into both nostrils daily. 16 g 1  . Hypromellose (ARTIFICIAL TEARS OP) Place 1 drop into both eyes 4 (four) times daily as needed (dry eyes).    Marland Kitchen ipratropium (ATROVENT) 0.06 % nasal spray Place 2 sprays into both nostrils 4 (four) times daily. 15 mL 12  . rosuvastatin (CRESTOR) 10 MG tablet TAKE 1 TABLET (10 MG TOTAL) BY MOUTH DAILY. NEEDS APPT FOR FUTURE REFILLS 90 tablet 2  . sacubitril-valsartan (ENTRESTO) 97-103 MG Take 1 tablet by mouth 2 (two) times daily. 60 tablet 6  . spironolactone (ALDACTONE) 25 MG tablet TAKE 1 TABLET BY MOUTH EVERY DAY 90 tablet 1  . nitroGLYCERIN (NITROSTAT) 0.4 MG SL tablet Place 1 tablet  (0.4 mg total) under the tongue every 5 (five) minutes as needed for chest pain. (Patient not taking: Reported on 07/15/2019) 25 tablet 3  . triamcinolone cream (KENALOG) 0.1 %     . hydrocortisone (ANUSOL-HC) 2.5 % rectal cream Place 1 application rectally 2 (two) times daily as needed for hemorrhoids or anal itching. (Patient not taking: Reported on 07/15/2019) 30 g 1  . terbinafine (LAMISIL) 250 MG tablet Take 250 mg by mouth daily.     No facility-administered medications prior to visit.    ROS Review of Systems  Constitutional: Negative.  Negative for appetite change, diaphoresis, fatigue and unexpected weight change.  HENT: Negative.   Eyes: Negative for visual disturbance.  Respiratory: Negative for cough, chest tightness, shortness of breath and wheezing.   Cardiovascular: Negative for chest pain, palpitations and leg swelling.  Gastrointestinal: Negative for abdominal pain, constipation, diarrhea, nausea and vomiting.  Endocrine: Negative.   Genitourinary: Negative.  Negative for difficulty urinating, dysuria and hematuria.  Musculoskeletal: Negative.  Negative for arthralgias and myalgias.  Skin: Negative.  Negative for color change.  Neurological: Negative for dizziness, weakness, light-headedness and headaches.  Hematological: Negative for adenopathy. Does not bruise/bleed easily.  Psychiatric/Behavioral: Negative.     Objective:  BP (!) 144/60 (BP Location: Left Arm, Patient Position: Sitting, Cuff Size: Large)   Pulse 82   Temp 98.1 F (36.7 C) (Oral)   Ht 5\' 9"  (1.753 m)   Wt 179  lb (81.2 kg)   SpO2 98%   BMI 26.43 kg/m   BP Readings from Last 3 Encounters:  07/15/19 (!) 144/60  01/12/19 (!) 144/60  12/31/18 125/70    Wt Readings from Last 3 Encounters:  07/15/19 179 lb (81.2 kg)  01/12/19 179 lb (81.2 kg)  12/31/18 174 lb (78.9 kg)    Physical Exam Vitals reviewed.  Constitutional:      Appearance: Normal appearance.  HENT:     Nose: Nose normal.      Mouth/Throat:     Mouth: Mucous membranes are moist.  Eyes:     General: No scleral icterus.    Conjunctiva/sclera: Conjunctivae normal.  Cardiovascular:     Rate and Rhythm: Normal rate and regular rhythm.  No extrasystoles are present.    Heart sounds: Normal heart sounds, S1 normal and S2 normal. No gallop.   Pulmonary:     Effort: Pulmonary effort is normal.     Breath sounds: No stridor. No wheezing, rhonchi or rales.  Abdominal:     General: Abdomen is flat. Bowel sounds are normal. There is no distension.     Palpations: Abdomen is soft. There is no hepatomegaly, splenomegaly or mass.     Tenderness: There is no abdominal tenderness.  Musculoskeletal:        General: Normal range of motion.     Cervical back: Neck supple.     Right lower leg: No edema.     Left lower leg: No edema.  Lymphadenopathy:     Cervical: No cervical adenopathy.  Skin:    General: Skin is warm and dry.     Coloration: Skin is not pale.  Neurological:     General: No focal deficit present.     Mental Status: He is alert and oriented to person, place, and time. Mental status is at baseline.  Psychiatric:        Mood and Affect: Mood normal.        Behavior: Behavior normal.        Thought Content: Thought content normal.     Lab Results  Component Value Date   WBC 4.3 07/15/2019   HGB 12.8 (L) 07/15/2019   HCT 39.2 07/15/2019   PLT 156.0 07/15/2019   GLUCOSE 109 (H) 07/15/2019   CHOL 113 07/15/2019   TRIG 156.0 (H) 07/15/2019   HDL 46.70 07/15/2019   LDLCALC 35 07/15/2019   ALT 8 07/15/2019   AST 14 07/15/2019   NA 143 07/15/2019   K 4.2 07/15/2019   CL 106 07/15/2019   CREATININE 1.14 07/15/2019   BUN 21 07/15/2019   CO2 27 07/15/2019   TSH 2.21 01/12/2019   PSA 0.00 Repeated and verified X2. (L) 07/15/2019   INR 1.1 12/18/2017   HGBA1C 6.1 07/15/2019   MICROALBUR 4.2 (H) 01/12/2019    No results found.  Assessment & Plan:   Atreyu was seen today for annual exam,  hyperlipidemia, diabetes and hypertension.  Diagnoses and all orders for this visit:  Essential hypertension- His blood pressure is adequately well controlled.  He has developed a mild anemia.  I have asked him to return in the next few months to recheck his H&H and I will screen him for vitamin deficiencies.  His electrolytes and renal function are normal. -     CBC with Differential/Platelet -     Basic metabolic panel  Persistent atrial fibrillation- He is maintaining sinus rhythm.  Will continue anticoagulation with the DOAC. -  Cancel: TSH  Diabetes mellitus without complication ([place])- He has achieved excellent glycemic control. -     Basic metabolic panel -     Hemoglobin A1c  Hyperlipidemia with target LDL less than 70- He has achieved his LDL goal and is doing well on the statin. -     Lipid panel -     Hepatic function panel  Routine general medical examination at a health care facility-  Exam completed, labs reviewed, vaccines reviewed and updated, no cancer screenings are indicated, patient education was given.  Prostate cancer [facility])- His PSA is undetectable.  This is a reassuring sign that he does not have prostate cancer. -     PSA   I have discontinued Princess Bruins. Devaul "Bill"'s terbinafine and hydrocortisone. I am also having him maintain his cholecalciferol, Hypromellose (ARTIFICIAL TEARS OP), nitroGLYCERIN, ipratropium, fluticasone, Eliquis, triamcinolone cream, rosuvastatin, spironolactone, dofetilide, sacubitril-valsartan, and fexofenadine.  No orders of the defined types were placed in this encounter.    Follow-up: Return in about 6 months (around 01/12/2020).  [name], MD

## 2019-07-15 NOTE — Patient Outreach (Signed)
[facility]) Care Management  07/15/2019  Dillon Young 1932/12/13 [id number]   Patient called to inquire about a drug interaction between Benadryl and dofetilide. HIPAA identifiers were obtained.  A drug interaction check was completed and an interaction between Benadryl and dofetilide was not found.  Patient said his Provider told him not to take Benadryl with the dofetilide but he could not understand why.  Patient was encouraged to call his provider and to always follow the instructions of his doctor.  He also wondered if he could take Allegra. Actually, he said Allegra worked better for him than Benadryl did. Patient had cetirizine on his list but said he had not been taking that.   Both Cetirizine and Fexofenadine are second generation antihistamines where diphenhydamine (Benadryl) is a first generation antihistamine.  First generation antihistamines bind more tightly to the histamine receptor and cause a great deal of anticholinergic side effects like drowsiness, dryness, urinary retention and CNS depression.    Patient was educated about the side effects of Benadryl and encouraged to call his provider about Fexofenadine. (It was added to his medication list as he said he had already been taking it and it was helping with his allergic symptoms.)  Plan: Follow up with patient at next CSNP Follow up (2 weeks-will inquire about fexofenadine at that time as well).   [name], PharmD, [place] Clinical Pharmacist [phone]

## 2019-07-15 NOTE — Patient Instructions (Signed)

## 2019-07-27 ENCOUNTER — Telehealth: Payer: Self-pay

## 2019-07-27 ENCOUNTER — Other Ambulatory Visit: Payer: Self-pay | Admitting: Pharmacist

## 2019-07-27 ENCOUNTER — Other Ambulatory Visit: Payer: Self-pay

## 2019-07-27 MED ORDER — SACUBITRIL-VALSARTAN 97-103 MG PO TABS: 1.0000 | ORAL_TABLET | Freq: Two times a day (BID) | ORAL | 3 refills | Status: DC

## 2019-07-27 NOTE — Telephone Encounter (Signed)
Called pt - had questions regarding refills Entresto and Eliquis refills - asked pt to contact pharmacy first for refills and then contact the office if there are any issues. Verbalized understanding.

## 2019-07-27 NOTE — Patient Outreach (Signed)
[facility]) Care Management  07/27/2019  Dillon Young [birth year] [id number]   Patient was called regarding medication assistance with Eliquis and Entresto. Unfortunately, he did not answer his phone. HIPAA compliant message was left on his voicemail.  Plan: Call patient back in 7-10 business days.  [name], PharmD, [place] Clinical Pharmacist [phone]

## 2019-07-27 NOTE — Patient Outreach (Signed)
  [facility]) Care Management Chronic Special Needs Program    07/27/2019  Name: Dillon Young, DOB: [birth year]  MRN: [id number]   Mr. Dillon Young is enrolled in a chronic special needs plan for Heart Failure. Received phone call from client. States he has questions about getting refills for his [name] and Eliquis since he has been approved for help  Instructed client that [facility] will message [place] pharmacist Denyse Amass, [facility] to get in contact with him to assist him Messaged Denyse Amass, [facility] of clients concerns. RNCM will follow up with client as scheduled per tier level  [place], [facility], [place] Management [phone]

## 2019-07-29 ENCOUNTER — Other Ambulatory Visit: Payer: Self-pay | Admitting: Pharmacist

## 2019-07-29 ENCOUNTER — Ambulatory Visit: Payer: Self-pay | Admitting: Pharmacist

## 2019-07-29 NOTE — Patient Outreach (Signed)
[facility]) Care Management  07/29/2019  Dillon Young 04-05-1933 [id number]   Patient was called regarding medication assistance. Unfortunately, he did not answer the phone. HIPAA compliant message was left on his voicemail.  Plan: Await a call back from the patient. Call patient back in 4-6 weeks.  [name], PharmD, [place] Clinical Pharmacist [phone]

## 2019-08-03 NOTE — Telephone Encounter (Signed)
Spoke with pt, says he has not heard anything back about his medications and he is concerned since they were only filled for 3 months and he is coming up on his 4th month. States that these medications were filled 03/2019. He states that he needs to "reapply" for his medications. Pt says that he "spoke with entresto" and that they have faxed the PA but he hasn't heard anything back. And that neither company has heard anything from our office regarding his medications.  Pt states that he feels in the dark and is very concerned about running out of this medication. Notified pt that wit a PA, it may take up to 3 days for [place] to hear back from the plan. Pt stated that he understands but he would like to be notified when the paperwork has been sent in.  PA needed for both entresto and eliquis. Will route to primary to begin.

## 2019-08-03 NOTE — Telephone Encounter (Signed)
Follow Up  Patient is calling in to follow up about medication refills and the application for the medications. Please give a call back to discuss. Patient can be reached at [phone] or [phone].

## 2019-08-04 ENCOUNTER — Telehealth: Payer: Self-pay

## 2019-08-04 NOTE — Telephone Encounter (Signed)
Called and spoke to pt about patient assistance for Entresto and Eliquis. Called both foundations and they never received the paperwork. Re-filled out paperwork and sent in the prescriber part. Advised pt to fill out patient part and mail it in. Verbalized understanding.

## 2019-08-04 NOTE — Telephone Encounter (Signed)
Follow Up  Patient is calling to let us know that the authorization form was sent to [phone] today. Please call patient to confirm that the paperwork has been received.

## 2019-08-04 NOTE — Telephone Encounter (Signed)
error 

## 2019-08-07 DIAGNOSIS — L304 Erythema intertrigo: Secondary | ICD-10-CM | POA: Diagnosis not present

## 2019-08-10 NOTE — Telephone Encounter (Signed)
Follow up  Pt received form last Saturday, he said he filled it up and would like to know if he can give dropped it off to [place] today.  Please advise

## 2019-08-10 NOTE — Telephone Encounter (Signed)
The patient's wife has been advised that he can bring the papers to the office any time today and we will get them faxed for him.

## 2019-08-11 ENCOUNTER — Other Ambulatory Visit: Payer: Self-pay | Admitting: *Deleted

## 2019-08-11 ENCOUNTER — Telehealth: Payer: Self-pay | Admitting: *Deleted

## 2019-08-11 MED ORDER — SACUBITRIL-VALSARTAN 97-103 MG PO TABS: 1.0000 | ORAL_TABLET | Freq: Two times a day (BID) | ORAL | 3 refills | Status: DC

## 2019-08-11 MED ORDER — APIXABAN 5 MG PO TABS: 5.0000 mg | ORAL_TABLET | Freq: Two times a day (BID) | ORAL | 3 refills | Status: DC

## 2019-08-11 NOTE — Telephone Encounter (Signed)
Assistance forms for Entresto and Eliquis has been faxed.

## 2019-08-12 DIAGNOSIS — R7303 Prediabetes: Secondary | ICD-10-CM | POA: Diagnosis not present

## 2019-08-12 DIAGNOSIS — H26491 Other secondary cataract, right eye: Secondary | ICD-10-CM | POA: Diagnosis not present

## 2019-08-12 DIAGNOSIS — H524 Presbyopia: Secondary | ICD-10-CM | POA: Diagnosis not present

## 2019-08-12 DIAGNOSIS — H04123 Dry eye syndrome of bilateral lacrimal glands: Secondary | ICD-10-CM | POA: Diagnosis not present

## 2019-08-12 LAB — HM DIABETES EYE EXAM

## 2019-08-17 NOTE — Telephone Encounter (Signed)
Spoke with the patient. He stated that in order for him to be approved for Eliquis he has to spend 3% of his income. He is waiting to hear back from his insurance company.  Samples have been given in the meantime:  Medication samples have been provided to the patient.  Drug name: Eliquis  Qty: 5 mg  LOT: [id number]  Exp.Date: 3/23

## 2019-08-18 ENCOUNTER — Other Ambulatory Visit: Payer: Self-pay | Admitting: Cardiovascular Disease

## 2019-08-20 ENCOUNTER — Encounter: Payer: Self-pay | Admitting: Internal Medicine

## 2019-08-21 MED ORDER — FLUTICASONE PROPIONATE 50 MCG/ACT NA SUSP: 2.0000 | Freq: Every day | NASAL | 1 refills | Status: DC

## 2019-08-26 DIAGNOSIS — M79674 Pain in right toe(s): Secondary | ICD-10-CM | POA: Diagnosis not present

## 2019-08-26 DIAGNOSIS — L600 Ingrowing nail: Secondary | ICD-10-CM | POA: Diagnosis not present

## 2019-08-26 DIAGNOSIS — M79675 Pain in left toe(s): Secondary | ICD-10-CM | POA: Diagnosis not present

## 2019-09-02 ENCOUNTER — Other Ambulatory Visit (HOSPITAL_COMMUNITY): Payer: Self-pay | Admitting: Internal Medicine

## 2019-09-04 ENCOUNTER — Ambulatory Visit: Payer: Self-pay | Admitting: Pharmacist

## 2019-09-04 ENCOUNTER — Other Ambulatory Visit: Payer: Self-pay | Admitting: Pharmacist

## 2019-09-04 NOTE — Patient Outreach (Signed)
[facility])  [facility] Quality Pharmacy Team    [facility] pharmacy case will be closed as our team is transitioning from the [facility] Care Management Department into the [facility] Quality Department and will no longer be using [facility] for documentation purposes.      Joycie Aerts J. Helia Haese, PharmD, BCACP [facility] Clinical Pharmacist [phone]   

## 2019-09-10 NOTE — Telephone Encounter (Signed)
I spoke with patient. He reports he returned completed paperwork to office for [facility] assistance. He has heard from [employer] that additional information is needed from our office.  Patient requests we check on this and then contact him with an update

## 2019-09-11 ENCOUNTER — Other Ambulatory Visit: Payer: Self-pay | Admitting: Pharmacist

## 2019-09-11 ENCOUNTER — Telehealth: Payer: Self-pay | Admitting: Cardiovascular Disease

## 2019-09-11 ENCOUNTER — Telehealth: Payer: Self-pay

## 2019-09-11 NOTE — Telephone Encounter (Signed)
I did a Entresto PA through covermymeds and received the following response: Camila Li [id number] Need help? Call us at [phone]  Outcome  Available without authorization.  DrugEntresto 97-103MG  tablets  [employer] Medicare 4-Part Emergency planning/management officer PA Form  I then called [employer] back and s/w Rollene Fare who states that since I can only provide [employer] PA approval verbally from covermymeds and not his ins plan we now need to contact the pts ins plan and ask for a copy of the pts benefits that shows deductible amount, prior authorization requirements for name brand meds, etc as the pts plan would not provide this info to [employer] when they requested it stating that they are a third party.  Rollene Fare states that this info will be provided to the provider's office. Since I have never heard this before I asked Rollene Fare if this is a new step for applying for pt asst through [employer] and she states that this does not happen often that a ins provider will not provide this info to them but it has happened in the past.  I will work on this Monday 4/19.

## 2019-09-11 NOTE — Patient Outreach (Signed)
[facility])  [place] Team    [facility] pharmacy case will be closed as our team is transitioning from the [place] Management Department into the [facility] Quality Department and will no longer be using [facility] for documentation purposes.     Official removal of myself from the patient's care team. Since he has HTA, all medication assistance requests should be sent to them.  [name], PharmD, [place] Clinical Pharmacist [phone]

## 2019-09-11 NOTE — Telephone Encounter (Signed)
Spoke to [employer] patient assistance [phone].[name] requires a prior authorization before they can finish processing the application.Fax approval or denial to fax # [phone].Message sent to our prior authorization nurse.

## 2019-09-11 NOTE — Telephone Encounter (Signed)
Pt c/o medication issue:  1. Name of Medication: sacubitril-valsartan (ENTRESTO) 97-103 MG  2. How are you currently taking this medication (dosage and times per day)?   3. Are you having a reaction (difficulty breathing--STAT)?   4. What is your medication issue? Whitney from [employer] is calling due to the patient needing pre authorization from insurance for this medication. Please advise.

## 2019-09-11 NOTE — Telephone Encounter (Signed)
Will route to Primary Nurse that is covering for assistance on the PA for [facility]

## 2019-09-13 ENCOUNTER — Other Ambulatory Visit: Payer: Self-pay | Admitting: Internal Medicine

## 2019-09-14 NOTE — Telephone Encounter (Signed)
**Note De-Identified Abrham Maslowski Obfuscation** I called [employer] line at [phone] and s/w Verdene Lennert concerning obtaining a copy of the pts benefits with them.  Per Verdene Lennert they do not provide this info to providers and first recommended that the pt call them for a copy of his benefits but found later in the conversation that they may not provide it to him either and may direct him to their web site for a copy.  She also states that the provider or pt asst program can get a copy from their web site as well by going to [email or web address] and selecting HMO Plan Diabetes and Heart care and print copy from there.

## 2019-09-14 NOTE — Telephone Encounter (Signed)
I have copied the pts "Benefits" from [employer] web site and the "Available without authorization" [place] PA message from covermymeds, scanned and emailed all to Elly Modena, LPN at our [place] so she can fax all to Pony at [employer] written on cover letter included.

## 2019-09-15 NOTE — Telephone Encounter (Signed)
**Note De-Identified Wes Lezotte Obfuscation** See "[name]" phone note from 09/11/2019 for details.

## 2019-09-23 ENCOUNTER — Telehealth: Payer: Self-pay | Admitting: Cardiovascular Disease

## 2019-09-23 ENCOUNTER — Other Ambulatory Visit: Payer: Self-pay | Admitting: Cardiovascular Disease

## 2019-09-23 MED ORDER — SACUBITRIL-VALSARTAN 97-103 MG PO TABS: 1.0000 | ORAL_TABLET | Freq: Two times a day (BID) | ORAL | 2 refills | Status: DC

## 2019-09-23 NOTE — Telephone Encounter (Signed)
Noted  

## 2019-09-23 NOTE — Telephone Encounter (Signed)
Patient sent a message through [place] he said to disregard the message.

## 2019-09-23 NOTE — Telephone Encounter (Signed)
*  STAT* If patient is at the pharmacy, call can be transferred to refill team.   1. Which medications need to be refilled? (please list name of each medication and dose if known) sacubitril-valsartan (ENTRESTO) 97-103 MG  2. Which pharmacy/location (including street and city if local pharmacy) is medication to be sent to? [id number] [place], [place] [address]  3. Do they need a 30 day or 90 day supply? 90 day

## 2019-09-26 ENCOUNTER — Other Ambulatory Visit: Payer: Self-pay | Admitting: Internal Medicine

## 2019-10-06 DIAGNOSIS — L570 Actinic keratosis: Secondary | ICD-10-CM | POA: Diagnosis not present

## 2019-10-06 DIAGNOSIS — L308 Other specified dermatitis: Secondary | ICD-10-CM | POA: Diagnosis not present

## 2019-10-06 DIAGNOSIS — X32XXXD Exposure to sunlight, subsequent encounter: Secondary | ICD-10-CM | POA: Diagnosis not present

## 2019-10-15 ENCOUNTER — Other Ambulatory Visit (HOSPITAL_COMMUNITY): Payer: Self-pay | Admitting: Cardiovascular Disease

## 2019-10-15 ENCOUNTER — Ambulatory Visit (HOSPITAL_COMMUNITY)
Admission: RE | Admit: 2019-10-15 | Discharge: 2019-10-15 | Disposition: A | Discharge status: Home / Self Care | Payer: HMO | Source: Ambulatory Visit | Attending: Cardiology | Admitting: Cardiology

## 2019-10-15 ENCOUNTER — Other Ambulatory Visit: Payer: Self-pay

## 2019-10-15 DIAGNOSIS — I6522 Occlusion and stenosis of left carotid artery: Secondary | ICD-10-CM | POA: Diagnosis not present

## 2019-10-15 DIAGNOSIS — Z9889 Other specified postprocedural states: Secondary | ICD-10-CM | POA: Diagnosis not present

## 2019-10-15 DIAGNOSIS — I6523 Occlusion and stenosis of bilateral carotid arteries: Secondary | ICD-10-CM

## 2019-10-19 ENCOUNTER — Telehealth: Payer: Self-pay

## 2019-10-19 DIAGNOSIS — I6521 Occlusion and stenosis of right carotid artery: Secondary | ICD-10-CM

## 2019-10-19 DIAGNOSIS — I6522 Occlusion and stenosis of left carotid artery: Secondary | ICD-10-CM

## 2019-10-19 NOTE — Telephone Encounter (Signed)
Spoke to patient carotid doppler results given.Advised to repeat in 12 months. °

## 2019-10-30 ENCOUNTER — Other Ambulatory Visit: Payer: Self-pay

## 2019-10-30 ENCOUNTER — Encounter (HOSPITAL_COMMUNITY): Payer: Self-pay | Admitting: Cardiology

## 2019-10-30 ENCOUNTER — Ambulatory Visit (HOSPITAL_COMMUNITY)
Admission: RE | Admit: 2019-10-30 | Discharge: 2019-10-30 | Disposition: A | Discharge status: Home / Self Care | Payer: HMO | Source: Ambulatory Visit | Attending: Cardiology | Admitting: Cardiology

## 2019-10-30 VITALS — BP 150/62 | HR 67 | Wt 179.0 lb

## 2019-10-30 DIAGNOSIS — I480 Paroxysmal atrial fibrillation: Secondary | ICD-10-CM | POA: Insufficient documentation

## 2019-10-30 DIAGNOSIS — I4819 Other persistent atrial fibrillation: Secondary | ICD-10-CM | POA: Diagnosis not present

## 2019-10-30 DIAGNOSIS — E785 Hyperlipidemia, unspecified: Secondary | ICD-10-CM | POA: Diagnosis not present

## 2019-10-30 DIAGNOSIS — I2510 Atherosclerotic heart disease of native coronary artery without angina pectoris: Secondary | ICD-10-CM

## 2019-10-30 DIAGNOSIS — Z9861 Coronary angioplasty status: Secondary | ICD-10-CM

## 2019-10-30 DIAGNOSIS — Z87891 Personal history of nicotine dependence: Secondary | ICD-10-CM | POA: Insufficient documentation

## 2019-10-30 DIAGNOSIS — Z8249 Family history of ischemic heart disease and other diseases of the circulatory system: Secondary | ICD-10-CM | POA: Insufficient documentation

## 2019-10-30 DIAGNOSIS — I25119 Atherosclerotic heart disease of native coronary artery with unspecified angina pectoris: Secondary | ICD-10-CM | POA: Diagnosis not present

## 2019-10-30 DIAGNOSIS — I5022 Chronic systolic (congestive) heart failure: Secondary | ICD-10-CM

## 2019-10-30 DIAGNOSIS — I110 Hypertensive heart disease with heart failure: Secondary | ICD-10-CM | POA: Diagnosis not present

## 2019-10-30 DIAGNOSIS — I6529 Occlusion and stenosis of unspecified carotid artery: Secondary | ICD-10-CM | POA: Insufficient documentation

## 2019-10-30 DIAGNOSIS — Z955 Presence of coronary angioplasty implant and graft: Secondary | ICD-10-CM | POA: Insufficient documentation

## 2019-10-30 DIAGNOSIS — I255 Ischemic cardiomyopathy: Secondary | ICD-10-CM | POA: Diagnosis not present

## 2019-10-30 DIAGNOSIS — Z7901 Long term (current) use of anticoagulants: Secondary | ICD-10-CM | POA: Insufficient documentation

## 2019-10-30 DIAGNOSIS — Z79899 Other long term (current) drug therapy: Secondary | ICD-10-CM | POA: Insufficient documentation

## 2019-10-30 LAB — BASIC METABOLIC PANEL
Anion gap: 10 (ref 5–15)
BUN: 17 mg/dL (ref 8–23)
CO2: 26 mmol/L (ref 22–32)
Calcium: 9.3 mg/dL (ref 8.9–10.3)
Chloride: 105 mmol/L (ref 98–111)
Creatinine, Ser: 1.05 mg/dL (ref 0.61–1.24)
GFR calc Af Amer: >60 mL/min (ref >60)
GFR calc non Af Amer: >60 mL/min (ref >60)
Glucose, Bld: 82 mg/dL (ref 70–99)
Potassium: 4.5 mmol/L (ref 3.5–5.1)
Sodium: 141 mmol/L (ref 135–145)

## 2019-10-30 LAB — CBC
HCT: 40.4 % (ref 39.0–52.0)
Hemoglobin: 12.4 g/dL — ABNORMAL LOW (ref 13.0–17.0)
MCH: 25.5 pg — ABNORMAL LOW (ref 26.0–34.0)
MCHC: 30.7 g/dL (ref 30.0–36.0)
MCV: 83 fL (ref 80.0–100.0)
Platelets: 184 10*3/uL (ref 150–400)
RBC: 4.87 MIL/uL (ref 4.22–5.81)
RDW: 15.2 % (ref 11.5–15.5)
WBC: 4.5 10*3/uL (ref 4.0–10.5)
nRBC: 0 % (ref 0.0–0.2)

## 2019-10-30 LAB — MAGNESIUM: Magnesium: 2 mg/dL (ref 1.7–2.4)

## 2019-10-30 NOTE — Patient Instructions (Signed)
Labs done today, your results will be available in MyChart, we will contact you for abnormal readings.  Your physician has requested that you regularly monitor and record your blood pressure readings at home. Please use the same machine at the same time of day to check your readings. PLEASE RECORD THE READINGS AND SEND THEM TO [place] VIA [place] OR CALL us WITH READINGS IN 2 WEEKS  Your physician has requested that you have an echocardiogram. Echocardiography is a painless test that uses sound waves to create images of your heart. It provides your doctor with information about the size and shape of your heart and how well your heart's chambers and valves are working. This procedure takes approximately one hour. There are no restrictions for this procedure.  Please call our office in December to schedule your follow up appointment  If you have any questions or concerns before your next appointment please send [place] a message through [place] or call our office at [phone].    TO LEAVE A MESSAGE FOR THE NURSE SELECT OPTION 2, PLEASE LEAVE A MESSAGE INCLUDING: . YOUR NAME . DATE OF BIRTH . CALL BACK NUMBER . REASON FOR CALL**this is important as we prioritize the call backs  [place] AS LONG AS YOU CALL BEFORE 4:00 PM  At the [facility], you and your health needs are our priority. As part of our continuing mission to provide you with exceptional heart care, we have created designated Provider Care Teams. These Care Teams include your primary Cardiologist (physician) and Advanced Practice Providers (APPs- Physician Assistants and Nurse Practitioners) who all work together to provide you with the care you need, when you need it.   You may see any of the following providers on your designated Care Team at your next follow up: Marland Kitchen Dr Glori Bickers . Dr [name] . Darrick Grinder, NP . Lyda Jester, PA . Audry Riles, PharmD   Please be sure to  bring in all your medications bottles to every appointment.

## 2019-11-01 NOTE — Progress Notes (Signed)
Date:  11/01/2019   ID:  Dillon Young, DOB April 03, 1933, MRN [id number]   Provider location: [facility] Type of Visit: Established patient   PCP:  [name], MD  Cardiologist:  [name], MD HF Cardiology: Dr. [name]   History of Present Illness: Dillon Young is a 86 y.o. male who has a h/o coronary artery disease, chronic systolic CHF, and persistent atrial fibrillation, he was initially referred by Dr. [name] for evaluation of CHF.  He is status post angioplasty in 1994 and 1995of his circumflex coronary artery. He had right carotid endarterectomy performed by Dr. Curt Jews June 2013.  Given angina, he had cath in 8/17.  This showed severe RCA stenosis treated with DES. He also had a very small ramus branch that was 90% stenosed and not suitable for intervention as well as a 70% mid LAD lesion although he had no evidence of anterior ischemia on his stress test.  He was diagnosed in 5/19 with new onset atrial fibrillation. Echo was done in 5/19 and showed EF decreased to 30-35%, normal EF by echo in August 2017. He had a DCCV in 5/19, unfortunately, he only held NSR for a few days and was been back in atrial fibrillation soon after that.  He had recurrent chest pain and was again cathed in 01/04/18, this time showing 80% proximal/mid LAD that was treated with DES.  In 10/19, he was admitted for Tikosyn load and was cardioverted back to NSR.   Echo in 1/20 showed  EF up to 55-60% with mild LVH.   He presents for followup of CHF, atrial fibrillation.  He is in NSR today, does not feel palpitations.  BP has been high for the last 3-4 days, had been in 400Q systolic prior to that.  No significant exertional dyspnea or chest pain.  No orthopnea/PND.  No BRBPR/melena.   ECG (personally reviewed): NSR with PVCs, QTc 437 msec  Labs (5/19): LDL 50 Labs (8/19): K 4.1, creatinine 1.14 Labs (10/19): K 4.6, creatinine 1.17 Labs (11/19): K 4.5, creatinine  1.08 Labs (1/20): LDL 48 Labs (2/20): K 4.4, creatinine 1.18 Labs (2/21): K 4.2, creatinine 1.14, LDL 35  PMH: 1. HTN 2. Hyperlipidemia 3. Carotid artery disease: Right CEA in 6/13.  - Carotid dopplers (5/20): 1-39% BICA stenosis, left subclavian stenosis.  - Carotid dopplers (5/21): s/p right CEA, 67-61% LICA stenosis.  4. CAD: - PTCA to LCx in 1994, 1995.  - Angina 8/17 => DES to RCA, also noted to have 70% mLAD, 90% stenosis small ramus.  - LHC 12/26/17 with 80% ostial-proximal LAD, EF < 25% on LV-gram.  DES to LAD.  5. Chronic systolic CHF: Ischemic cardiomyopathy.   - Echo (8/17) with normal EF.  - Echo (5/19) with EF 30-35%, severe LV dilation, regional wall motion abnormalities.  - Echo (1/20): EF 55-60%, mild LVH, normal RV size and systolic function.  6. Atrial fibrillation: Diagnosed 5/19.   - DCCV 5/19 only held for about 2 days.  - Tikosyn loaded in 10/19 and DCCV to NSR.   Current Outpatient Medications  Medication Sig Dispense Refill  . cholecalciferol (VITAMIN D) 1000 UNITS tablet Take 1,000 Units by mouth at bedtime.     . clobetasol (TEMOVATE) 0.05 % external solution     . dofetilide (TIKOSYN) 125 MCG capsule Take 1 capsule (125 mcg total) by mouth 2 (two) times daily. 180 capsule 2  . ELIQUIS 5 MG TABS tablet TAKE  1 TABLET BY MOUTH TWICE A DAY 180 tablet 1  . fexofenadine (ALLEGRA) 180 MG tablet Take 180 mg by mouth daily.    . fluticasone (FLONASE) 50 MCG/ACT nasal spray SPRAY 2 SPRAYS INTO EACH NOSTRIL EVERY DAY 48 mL 1  . Hypromellose (ARTIFICIAL TEARS OP) Place 1 drop into both eyes 4 (four) times daily as needed (dry eyes).    Marland Kitchen ipratropium (ATROVENT) 0.06 % nasal spray Place 2 sprays into both nostrils 4 (four) times daily. 15 mL 12  . ketoconazole (NIZORAL) 2 % cream SMARTSIG:1 Application Topical 1 to 2 Times Daily    . nitroGLYCERIN (NITROSTAT) 0.4 MG SL tablet Place 1 tablet (0.4 mg total) under the tongue every 5 (five) minutes as needed for chest pain.  25 tablet 3  . rosuvastatin (CRESTOR) 10 MG tablet TAKE 1 TABLET (10 MG TOTAL) BY MOUTH DAILY. NEEDS APPT FOR FUTURE REFILLS 90 tablet 2  . sacubitril-valsartan (ENTRESTO) 97-103 MG Take 1 tablet by mouth 2 (two) times daily. 180 tablet 2  . spironolactone (ALDACTONE) 25 MG tablet Take 1 tablet (25 mg total) by mouth daily. Please call for office visit [phone] 90 tablet 1  . triamcinolone cream (KENALOG) 0.1 %      No current facility-administered medications for this encounter.    Allergies:   Lipitor [atorvastatin]   Social History:  The patient  reports that he quit smoking about 11 years ago. His smoking use included cigarettes. He has a 90.00 pack-year smoking history. He has never used smokeless tobacco. He reports current alcohol use. He reports that he does not use drugs.   Family History:  The patient's family history includes Asthma in his paternal grandmother; Heart disease in his father; Hypertension in his mother.   ROS:  Please see the history of present illness.   All other systems are personally reviewed and negative.   Exam:   BP (!) 150/62   Pulse 67   Wt 81.2 kg (179 lb)   SpO2 99%   BMI 26.43 kg/m  General: NAD Neck: No JVD, no thyromegaly or thyroid nodule.  Lungs: Clear to auscultation bilaterally with normal respiratory effort. CV: Nondisplaced PMI.  Heart regular S1/S2, no S3/S4, no murmur.  No peripheral edema.  No carotid bruit.  Normal pedal pulses.  Abdomen: Soft, nontender, no hepatosplenomegaly, no distention.  Skin: Intact without lesions or rashes.  Neurologic: Alert and oriented x 3.  Psych: Normal affect. Extremities: No clubbing or cyanosis.  HEENT: Normal.   Recent Labs: 01/12/2019: TSH 2.21 07/15/2019: ALT 8 10/30/2019: BUN 17; Creatinine, Ser 1.05; Hemoglobin 12.4; Magnesium 2.0; Platelets 184; Potassium 4.5; Sodium 141  Personally reviewed   Wt Readings from Last 3 Encounters:  10/30/19 81.2 kg (179 lb)  07/15/19 81.2 kg (179 lb)   01/12/19 81.2 kg (179 lb)      ASSESSMENT AND PLAN:  1. Chronic systolic CHF: Suspect mixed ischemic/nonischemic cardiomyopathy.  Echo in 5/19 with EF 30-35% and severe LV dilation.  CAD likely plays role, but fall in EF was noted around the time he went into atrial fibrillation.  No RVR, but can see fall in EF with afib even in the absence of RVR. He has been in NSR on Tikosyn, and echo from 1/20 showed EF up to 55-60%.  On exam, he is euvolemic.  NYHA class II symptoms.  - He is off Coreg due to bradycardia.  - Continue Entresto 97/103 bid.  BMET today.   - Continue spironolactone 25 mg daily.  -  Repeat echo to make sure that EF remains normal.  2. CAD: Most recently, DES to proximal/mid LAD on 12/26/17. No chest pain since then.  He was on Plavix for > 6 months then stopped it with gross hematuria.  - Continue apixaban 5 mg bid.  CBC today.  - No ASA with apixaban use.  - Continue Crestor, good lipids in 2/21.  3. Atrial fibrillation: Paroxysmal.  DCCV in 5/19 only held a few days.  Tikosyn loaded in 10/19 with DCCV, now in NSR.   QTc ok on ECG today.  - Continue Eliquis 5 mg bid.  - Continue Tikosyn, check BMET and Mg.  4. HTN: BP has been elevated for a couple of days.  He will check his BP daily at home for me for 2 wks then send in the readings.  May need to increase BP regimen.  5. Carotid stenosis: Repeat carotid dopplers 5/22.    Followup in 6 months.   Signed, [name], MD  11/01/2019  [place] [address] [facility] [place] [address] [phone] (office) [phone] (fax)

## 2019-11-04 ENCOUNTER — Other Ambulatory Visit: Payer: Self-pay

## 2019-11-04 ENCOUNTER — Other Ambulatory Visit: Payer: Self-pay | Admitting: Cardiovascular Disease

## 2019-11-04 ENCOUNTER — Ambulatory Visit (HOSPITAL_COMMUNITY)
Admission: RE | Admit: 2019-11-04 | Discharge: 2019-11-04 | Disposition: A | Discharge status: Home / Self Care | Payer: HMO | Source: Ambulatory Visit | Attending: Cardiology | Admitting: Cardiology

## 2019-11-04 DIAGNOSIS — I110 Hypertensive heart disease with heart failure: Secondary | ICD-10-CM | POA: Insufficient documentation

## 2019-11-04 DIAGNOSIS — E785 Hyperlipidemia, unspecified: Secondary | ICD-10-CM | POA: Diagnosis not present

## 2019-11-04 DIAGNOSIS — I714 Abdominal aortic aneurysm, without rupture: Secondary | ICD-10-CM | POA: Diagnosis not present

## 2019-11-04 DIAGNOSIS — I351 Nonrheumatic aortic (valve) insufficiency: Secondary | ICD-10-CM | POA: Diagnosis not present

## 2019-11-04 DIAGNOSIS — I5022 Chronic systolic (congestive) heart failure: Secondary | ICD-10-CM | POA: Insufficient documentation

## 2019-11-04 DIAGNOSIS — E119 Type 2 diabetes mellitus without complications: Secondary | ICD-10-CM | POA: Insufficient documentation

## 2019-11-04 NOTE — Progress Notes (Signed)
Echocardiogram 2D Echocardiogram has been performed.  Oneal Deputy Anwen Cannedy 11/04/2019, 9:29 AM

## 2019-11-04 NOTE — Telephone Encounter (Signed)
Rx request sent to pharmacy.  

## 2019-11-13 ENCOUNTER — Telehealth (HOSPITAL_COMMUNITY): Payer: Self-pay | Admitting: *Deleted

## 2019-11-13 NOTE — Telephone Encounter (Signed)
Most of these readings are ok, no changes.

## 2019-11-13 NOTE — Telephone Encounter (Signed)
Pt called to report bp readings for the last 2 weeks as requested by Dr.McLean.  Date/time      BP          Heart rate 6/4 2:30pm- 152/73      56  6/5 12:30pm- 113/58     75  6/6 12:30pm- 140/75    63  6/7 12:30pm- 118/63    73  6/8 2:30pm- 105/57      75  6/9 1:00pm- 125/62      62  6/10 12:45pm- 111/50   74  6/11 1:30pm- 131/61     70  6/12 1:00pm- 108/56     76  6/13 9:30am- 110/68      57  6/14 2:00pm-126/63       75  6/15 12:30pm-  105/56   82   6/16 12:30pm - 117/60    67  6/17 12:45pm- 121/58    68

## 2019-11-16 NOTE — Telephone Encounter (Signed)
Patient advised and verbalized understanding 

## 2019-11-24 NOTE — Telephone Encounter (Signed)
Called pt to inform that Dr. [name] recommends he be seen in office for further evaluations. Appointment scheduled for this Friday 7/2 at 9:45 am.

## 2019-11-27 ENCOUNTER — Encounter: Payer: Self-pay | Admitting: Cardiovascular Disease

## 2019-11-27 ENCOUNTER — Other Ambulatory Visit: Payer: Self-pay

## 2019-11-27 ENCOUNTER — Ambulatory Visit: Payer: HMO | Admitting: Cardiovascular Disease

## 2019-11-27 VITALS — BP 150/60 | HR 74 | Ht 69.0 in | Wt 180.0 lb

## 2019-11-27 DIAGNOSIS — I2510 Atherosclerotic heart disease of native coronary artery without angina pectoris: Secondary | ICD-10-CM

## 2019-11-27 DIAGNOSIS — I4819 Other persistent atrial fibrillation: Secondary | ICD-10-CM

## 2019-11-27 DIAGNOSIS — I6523 Occlusion and stenosis of bilateral carotid arteries: Secondary | ICD-10-CM

## 2019-11-27 DIAGNOSIS — I10 Essential (primary) hypertension: Secondary | ICD-10-CM

## 2019-11-27 DIAGNOSIS — I519 Heart disease, unspecified: Secondary | ICD-10-CM

## 2019-11-27 DIAGNOSIS — E785 Hyperlipidemia, unspecified: Secondary | ICD-10-CM | POA: Diagnosis not present

## 2019-11-27 DIAGNOSIS — Z9861 Coronary angioplasty status: Secondary | ICD-10-CM

## 2019-11-27 NOTE — Assessment & Plan Note (Signed)
History of CAD status post multiple PCI's in the past dating back to 64 with LAD and circumflex intervention.  His most recent cath which I performed 12/26/2017 was notable for a patent RCA stent with high-grade mid LAD stenosis which I stented using a synergy drug-eluting stent (2.25 x 16 mm long) postdilated with a 2 5 balloon (2.6 mm).  His EF was normal at that time.  He denies chest pain.

## 2019-11-27 NOTE — Assessment & Plan Note (Signed)
History of hyperlipidemia intolerant to statin therapy with lipid profile performed 07/15/2019 revealing total cholesterol 113, LDL 35 and HDL 46.

## 2019-11-27 NOTE — Assessment & Plan Note (Signed)
History of essential hypertension a blood pressure measured today at 150/60.  He is on Entresto.  I reviewed his blood pressure log and for the most part his blood pressures are in the low 100 range occasionally dipping down into the 90 systolic.

## 2019-11-27 NOTE — Assessment & Plan Note (Signed)
History of persistent atrial fibrillation maintaining sinus rhythm on Tikosyn and Eliquis oral anticoagulation.

## 2019-11-27 NOTE — Assessment & Plan Note (Signed)
History of left ventricular dysfunction in the past with echo performed 11/04/2019 revealing an EF of 50 to 55% with grade 1 diastolic dysfunction and normal valvular function.  He had severe left atrial enlargement.

## 2019-11-27 NOTE — Assessment & Plan Note (Signed)
History of carotid artery disease status post right carotid endarterectomy in 2013.  He did have carotid Dopplers performed 10/15/2019 that showed a widely patent endarterectomy site with moderate left ICA stenosis.  This will be repeated on an annual basis.

## 2019-11-27 NOTE — Patient Instructions (Signed)
Medication Instructions:  NO CHANGE *If you need a refill on your cardiac medications before your next appointment, please call your pharmacy*   Lab Work: If you have labs (blood work) drawn today and your tests are completely normal, you will receive your results only by: Marland Kitchen MyChart Message (if you have MyChart) OR . A paper copy in the mail If you have any lab test that is abnormal or we need to change your treatment, we will call you to review the results.  Follow-Up: At [facility], you and your health needs are our priority.  As part of our continuing mission to provide you with exceptional heart care, we have created designated Provider Care Teams.  These Care Teams include your primary Cardiologist (physician) and Advanced Practice Providers (APPs -  Physician Assistants and Nurse Practitioners) who all work together to provide you with the care you need, when you need it.  We recommend signing up for the patient portal called "MyChart".  Sign up information is provided on this After Visit Summary.  MyChart is used to connect with patients for Virtual Visits (Telemedicine).  Patients are able to view lab/test results, encounter notes, upcoming appointments, etc.  Non-urgent messages can be sent to your provider as well.   To learn more about what you can do with MyChart, go to [email or web address].    Your next appointment:    Your physician wants you to follow-up in: [place] will receive a reminder letter in the mail two months in advance. If you don't receive a letter, please call our office to schedule the follow-up appointment.   Your physician wants you to follow-up in: [place] will receive a reminder letter in the mail two months in advance. If you don't receive a letter, please call our office to schedule the follow-up appointment.

## 2019-11-27 NOTE — Progress Notes (Signed)
11/27/2019 WILLOUGHBY DOELL   1932-08-01  [id number]  Primary Physician [name], MD Primary Cardiologist: [name] MD [name], [name]  HPI:  Dillon Young is a 86 y.o.  married Caucasian male father of 2 children, grandfather to 2 grandchildren he was formally a patient of Dr. [name]. I last spoke to him for a virtual telephone telemedicine visit.  12/31/2018.  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 He works out 5 days a week and has joined [employer].He has a history of coronary artery disease status post angioplasty in 1994 and 1995 Of his circumflex coronary artery. He has not required cardiac catheterization since stress Myoview was performed in 2011 that was low risk and nonischemic. He has a history of hypertension and hyperlipidemia on medication. He had right carotid endarterectomy performed by Dr. [name] Early June 2013 followed by duplex ultrasound. He denies chest pain or shortness of breath. His major complaint is of cramping in his thighs which may be related to his statin drug. He was given a statin holiday which really did not impact his symptoms and therefore statin was restarted and follow-up lipid profile performed 07/05/14 revealed a total cholesterol 131, LDL 58 and HDL of 44. Recent carotid Dopplers revealed his endarterectomy site was widely patent with moderate left ICA stenosis and moderate left subclavian artery stenosis without symptoms. He developed symptoms in August of this year and saw [name]. A Myoview stress test performed 01/09/16 was notable for ischemia in the RCA distribution. Based on this he underwent cardiac catheterization by Dr. Varanasi8/21/17 revealing a 90% segmental mid dominant RCA stenosis which was stented with a Synergy 3.5 mm x 20 mm long drug-eluting stent. He did have a very small ramus branch that was 90% stenosed and not suitable for  intervention as well as a 70% mid LAD lesion although he had no evidence of anterior ischemia on his stress test. His symptoms of chest pain have resolved. Since I saw himthis months ago he is remained stable. He denies chest pain or shortness of breath. Does have mild ankle edema. He is in newly recognized atrial fibrillation today. He does relate a recent episode of hematuria which lasted for 10 days thought to be related related to his prostate and remote prostate cancer status post radioactive seed implantation. He did see Dr. [name], his urologist for evaluation and stopped his aspirin briefly with resolution of his hematuria. He has restarted dual anti-platelet therapy.  Since I saw him in the office a month ago he has had a 2D echo that revealed decline in his LV function from 50 to 55% by his last 2D echo 2 years ago to 30 to 35%. This may be related to his atrial fibrillation. He feels weak and fatigued with some shortness of breath as well.  Because of relatively new onset chest pain he saw [name] in the office he began him on oral nitrate. His pain improved but he was referred for diagnostic coronary angiography to define his anatomy.  I performed cardiac catheterization on him 12/26/2017 revealing a patent RCA stent with high-grade mid LAD lesion which underwent PCI and drug-eluting stenting. He was also found at that time by a left cheek log to have an EF of 25 to 30%. He was in A. fib. He subsequently undergone pharmacologic cardioversion with Tikosyn. He is on optimal medical therapy for his LV  dysfunction and has seen Dr. [name] in the advanced heart failure clinic as recently as 12/01/2018.  His ejection fraction increased to normal after cardioversion.  He was somewhat weak and bradycardic, and his carvedilol was discontinued by Dr. [name] recently resulting in improvement in his symptoms.  He denies chest pain or shortness of breath.  He was in sinus rhythm  when he saw Dr. [name] 12/03/2018 and again 11/01/2019.  Since I spoke to him on the phone a year ago he continues to do well.  He denies chest pain or shortness of breath.  He checks his blood pressure at home which are remain therapeutic.  He occasionally has low blood pressure for unclear reasons.  Recent 2D echo performed 11/04/2019 revealed EF of 50 to 55% with grade 1 diastolic dysfunction.  Carotid Dopplers performed in May showed moderate left ICA stenosis.  Current Meds  Medication Sig  . cholecalciferol (VITAMIN D) 1000 UNITS tablet Take 1,000 Units by mouth at bedtime.   . clobetasol (TEMOVATE) 0.05 % external solution   . dofetilide (TIKOSYN) 125 MCG capsule Take 1 capsule (125 mcg total) by mouth 2 (two) times daily.  Marland Kitchen ELIQUIS 5 MG TABS tablet TAKE 1 TABLET BY MOUTH TWICE A DAY  . fexofenadine (ALLEGRA) 180 MG tablet Take 180 mg by mouth daily.  . fluticasone (FLONASE) 50 MCG/ACT nasal spray SPRAY 2 SPRAYS INTO EACH NOSTRIL EVERY DAY  . Hypromellose (ARTIFICIAL TEARS OP) Place 1 drop into both eyes 4 (four) times daily as needed (dry eyes).  Marland Kitchen ipratropium (ATROVENT) 0.06 % nasal spray Place 2 sprays into both nostrils 4 (four) times daily.  Marland Kitchen ketoconazole (NIZORAL) 2 % cream SMARTSIG:1 Application Topical 1 to 2 Times Daily  . nitroGLYCERIN (NITROSTAT) 0.4 MG SL tablet Place 1 tablet (0.4 mg total) under the tongue every 5 (five) minutes as needed for chest pain.  . rosuvastatin (CRESTOR) 10 MG tablet TAKE 1 TABLET (10 MG TOTAL) BY MOUTH DAILY. NEEDS APPT FOR FUTURE REFILLS  . sacubitril-valsartan (ENTRESTO) 97-103 MG Take 1 tablet by mouth 2 (two) times daily.  Marland Kitchen spironolactone (ALDACTONE) 25 MG tablet Take 1 tablet (25 mg total) by mouth daily. Please call for office visit [phone]  . triamcinolone cream (KENALOG) 0.1 %      Allergies  Allergen Reactions  . Lipitor [Atorvastatin] Other (See Comments)    myalgias    Social History   Socioeconomic History  . Marital status:  Married    Spouse name: Not on file  . Number of children: 2  . Years of education: Not on file  . Highest education level: Not on file  Occupational History  . Occupation: [employer]  Tobacco Use  . Smoking status: Former Smoker    Packs/day: 1.50    Years: 60.00    Pack years: 90.00    Types: Cigarettes    Quit date: 07/25/2008    Years since quitting: 11.3  . Smokeless tobacco: Never Used  Substance and Sexual Activity  . Alcohol use: Yes    Alcohol/week: 0.0 standard drinks    Comment: Occ  . Drug use: No  . Sexual activity: Not on file  Other Topics Concern  . Not on file  Social History Narrative  . Not on file   Social Determinants of Health   Financial Resource Strain:   . Difficulty of Paying Living Expenses:   Food Insecurity: No Food Insecurity  . Worried About Charity fundraiser in the Last Year:  Never true  . Ran Out of Food in the Last Year: Never true  Transportation Needs: No Transportation Needs  . Lack of Transportation (Medical): No  . Lack of Transportation (Non-Medical): No  Physical Activity:   . Days of Exercise per Week:   . Minutes of Exercise per Session:   Stress:   . Feeling of Stress :   Social Connections:   . Frequency of Communication with Friends and Family:   . Frequency of Social Gatherings with Friends and Family:   . Attends Religious Services:   . Active Member of Clubs or Organizations:   . Attends Archivist Meetings:   Marland Kitchen Marital Status:   Intimate Partner Violence:   . Fear of Current or Ex-Partner:   . Emotionally Abused:   Marland Kitchen Physically Abused:   . Sexually Abused:      Review of Systems: General: negative for chills, fever, night sweats or weight changes.  Cardiovascular: negative for chest pain, dyspnea on exertion, edema, orthopnea, palpitations, paroxysmal nocturnal dyspnea or shortness of breath Dermatological: negative for rash Respiratory: negative for cough or wheezing Urologic:  negative for hematuria Abdominal: negative for nausea, vomiting, diarrhea, bright red blood per rectum, melena, or hematemesis Neurologic: negative for visual changes, syncope, or dizziness All other systems reviewed and are otherwise negative except as noted above.    Blood pressure (!) 150/60, pulse 74, height 5\' 9"  (1.753 m), weight 180 lb (81.6 kg), SpO2 99 %.  General appearance: alert and no distress Neck: no adenopathy, no JVD, supple, symmetrical, trachea midline, thyroid not enlarged, symmetric, no tenderness/mass/nodules and Soft bilateral carotid bruits Lungs: clear to auscultation bilaterally Heart: regular rate and rhythm, S1, S2 normal, no murmur, click, rub or gallop Extremities: extremities normal, atraumatic, no cyanosis or edema Pulses: 2+ and symmetric Skin: Skin color, texture, turgor normal. No rashes or lesions Neurologic: Alert and oriented X 3, normal strength and tone. Normal symmetric reflexes. Normal coordination and gait  EKG sinus rhythm at 74 with bigeminal PVCs.  I personally reviewed this EKG.  ASSESSMENT AND PLAN:   Hyperlipidemia with target LDL less than 70 History of hyperlipidemia intolerant to statin therapy with lipid profile performed 07/15/2019 revealing total cholesterol 113, LDL 35 and HDL 46.  Essential hypertension History of essential hypertension a blood pressure measured today at 150/60.  He is on Entresto.  I reviewed his blood pressure log and for the most part his blood pressures are in the low 100 range occasionally dipping down into the 90 systolic.  Left ventricular dysfunction History of left ventricular dysfunction in the past with echo performed 11/04/2019 revealing an EF of 50 to 55% with grade 1 diastolic dysfunction and normal valvular function.  He had severe left atrial enlargement.  Carotid artery disease- s/p RCE June 2013 History of carotid artery disease status post right carotid endarterectomy in 2013.  He did have carotid  Dopplers performed 10/15/2019 that showed a widely patent endarterectomy site with moderate left ICA stenosis.  This will be repeated on an annual basis.  CAD (coronary artery disease) History of CAD status post multiple PCI's in the past dating back to 58 with LAD and circumflex intervention.  His most recent cath which I performed 12/26/2017 was notable for a patent RCA stent with high-grade mid LAD stenosis which I stented using a synergy drug-eluting stent (2.25 x 16 mm long) postdilated with a 2 5 balloon (2.6 mm).  His EF was normal at that time.  He denies chest  pain.  Persistent atrial fibrillation History of persistent atrial fibrillation maintaining sinus rhythm on Tikosyn and Eliquis oral anticoagulation.      [name] MD FACP,FACC,FAHA, [facility] 11/27/2019 10:24 AM

## 2019-12-07 ENCOUNTER — Other Ambulatory Visit (HOSPITAL_COMMUNITY): Payer: Self-pay | Admitting: Cardiology

## 2019-12-14 ENCOUNTER — Other Ambulatory Visit: Payer: Self-pay

## 2019-12-14 NOTE — Patient Outreach (Signed)
[facility]) Care Management Chronic Special Needs Program  12/14/2019  Name: Dillon Young DOB: Oct 20, 1932  MRN: [id number]  Mr. Dillon Young is enrolled in a chronic special needs plan for Heart Failure. Reviewed and updated care plan.  Subjective:Client states he has been feeling good.  States he saw his cardiologist a few weeks ago and got a good checkup.  States he is weighing every day and his weight is staying close to 180 lbs.  Denies any chest pains or shortness of breath. States he knows when to call his  States his B/P has improved and it was 117/58 this morning.  Denies any problems with atrial fibrillation at this time.    States he is following a low sodium heart healthy diet and he avoids sweets.  States he is walking 35 minutes at his church's gym most days and he has started to go back to the [employer].  States he has prediabetes and his doctor told him he does not need any medication for it.  States he can afford his medications.   States he has gotten both of his COVID shots.   Goals Addressed              This Visit's Progress   .  COMPLETED: Client understands the importance of follow-up with providers by attending scheduled visits   On track     Keeping appointments with providers Plan to keep scheduled appointments with providers Goal completed 12/14/19    .  Client will report no worsening of symptoms of Atrial Fibrillation within the next 6 months(continue 12/14/19)   On track     Reports no worsening of atrial fibrillation Plan to take anticoagulant as ordered to prevent stroke    .  Client will report weighing and recording weights daily for the next 6 months(continue 12/14/19) (pt-stated)   On track     Reports weighing daily and recording Plan to weigh daily and record weight Plan to follow a low sodium/salt heart healthy diet    .  Client will verbalize knowledge of diabetes self-management as evidenced by Hgb A1C< 6.5% or as defined by  provider.        Reviewed importance of maintaining a low carbohydrate, low salt diet, watch portion sizes and avoid sugar sweetened drinks Reinforced to continue to exercise daily as tolerated    .  Client will verbalize knowledge of self management of Hypertension as evidences by BP reading of 140/90 or less; or as defined by provider   On track     Reports B/P less than 140/90 when checking at home and at provider visits Plan to check B/P regularly Take B/P medications as ordered Plan to follow a low salt diet  Plan to continue to exercise daily     .  Decrease inpatient Heart Failure admissions/ readmissions with in the year   On track     No admissions  this year and last year    .  Decrease the use of hospital emergency department related to heart failure within the next year   On track     No ED visits in 2020 or 2021    .  COMPLETED: Maintain timely refills of Heart Failure medication as prescribed within the year    On track     Maintaining timely refills per dispense report Goal completed 12/14/19    .  COMPLETED: Obtain annual  Lipid Profile, LDL-C   On track  Completed 07/15/19 LDL 35 The goal for LDL is less than 70 mg/dL as you are at high risk for complications Try to avoid saturated fats, trans-fats and eat more fiber Plan to take statin as ordered  Goal completed 12/14/19    .  Visit Primary Care Provider or Cardiologist at least 2 times per year   On track     Primary care provider on 07/15/19 Cardiology 10/30/19, 11/27/19 Please schedule your annual wellness visit      Client on [facility] list but is not engaged   Plan:  Send successful outreach letter with a copy of their individualized care plan and Send individual care plan to provider  Client will be outreached by a [employer] ([facility]) RNCM in 6 months per tier level     [place]ac[place], Jackquline Denmark, [facility] [phone]

## 2019-12-22 ENCOUNTER — Ambulatory Visit: Payer: HMO | Admitting: Internal Medicine

## 2019-12-23 ENCOUNTER — Telehealth: Payer: Self-pay

## 2019-12-23 ENCOUNTER — Ambulatory Visit: Payer: HMO

## 2019-12-23 NOTE — Telephone Encounter (Signed)
New message    The patient calling C/o headache and sinus pressure he voiced only having a home phone no cell phone at the home.    Please advise

## 2019-12-23 NOTE — Telephone Encounter (Signed)
F/u  The patient calling back to check on the status of this morning's messages.   The patient does not feel comfortable with a virtual appt and does admit to having a cell phone only use the house phone.   Cell phone # was not given.

## 2019-12-23 NOTE — Telephone Encounter (Signed)
Pt has had sinus pressure for 2 weeks. Pt has been taking sinus medication (zyrtec and allegra) to treat symptoms. Pt is having trouble sleeping last night due to the headache that the sinus pressure is causing.

## 2019-12-24 ENCOUNTER — Ambulatory Visit (INDEPENDENT_AMBULATORY_CARE_PROVIDER_SITE_OTHER): Payer: HMO | Admitting: Internal Medicine

## 2019-12-24 ENCOUNTER — Encounter: Payer: Self-pay | Admitting: Internal Medicine

## 2019-12-24 ENCOUNTER — Other Ambulatory Visit: Payer: Self-pay

## 2019-12-24 VITALS — BP 158/84 | HR 85 | Temp 98.3°F | Ht 69.0 in | Wt 177.0 lb

## 2019-12-24 DIAGNOSIS — I10 Essential (primary) hypertension: Secondary | ICD-10-CM | POA: Diagnosis not present

## 2019-12-24 DIAGNOSIS — I4819 Other persistent atrial fibrillation: Secondary | ICD-10-CM | POA: Diagnosis not present

## 2019-12-24 DIAGNOSIS — G8929 Other chronic pain: Secondary | ICD-10-CM | POA: Diagnosis not present

## 2019-12-24 DIAGNOSIS — R519 Headache, unspecified: Secondary | ICD-10-CM | POA: Diagnosis not present

## 2019-12-24 DIAGNOSIS — D539 Nutritional anemia, unspecified: Secondary | ICD-10-CM | POA: Insufficient documentation

## 2019-12-24 MED ORDER — TRAMADOL HCL 50 MG PO TABS: 50.0000 mg | ORAL_TABLET | Freq: Four times a day (QID) | ORAL | 1 refills | Status: DC | PRN

## 2019-12-24 NOTE — Patient Instructions (Signed)
General Headache Without Cause A headache is pain or discomfort that is felt around the head or neck area. There are many causes and types of headaches. In some cases, the cause may not be found. Follow these instructions at home: Watch your condition for any changes. Let your doctor know about them. Take these steps to help with your condition: Managing pain      Take over-the-counter and prescription medicines only as told by your doctor.  Lie down in a dark, quiet room when you have a headache.  If told, put ice on your head and neck area: ? Put ice in a plastic bag. ? Place a towel between your skin and the bag. ? Leave the ice on for 20 minutes, 2-3 times per day.  If told, put heat on the affected area. Use the heat source that your doctor recommends, such as a moist heat pack or a heating pad. ? Place a towel between your skin and the heat source. ? Leave the heat on for 20-30 minutes. ? Remove the heat if your skin turns bright red. This is very important if you are unable to feel pain, heat, or cold. You may have a greater risk of getting burned.  Keep lights dim if bright lights bother you or make your headaches worse. Eating and drinking  Eat meals on a regular schedule.  If you drink alcohol: ? Limit how much you use to:  0-1 drink a day for women.  0-2 drinks a day for men. ? Be aware of how much alcohol is in your drink. In the [place] one drink equals one 12 oz bottle of beer (355 mL), one 5 oz glass of wine (148 mL), or one 1 oz glass of hard liquor (44 mL).  Stop drinking caffeine, or reduce how much caffeine you drink. General instructions   Keep a journal to find out if certain things bring on headaches. For example, write down: ? What you eat and drink. ? How much sleep you get. ? Any change to your diet or medicines.  Get a massage or try other ways to relax.  Limit stress.  Sit up straight. Do not tighten (tense) your muscles.  Do not use any  products that contain nicotine or tobacco. This includes cigarettes, e-cigarettes, and chewing tobacco. If you need help quitting, ask your doctor.  Exercise regularly as told by your doctor.  Get enough sleep. This often means 7-9 hours of sleep each night.  Keep all follow-up visits as told by your doctor. This is important. Contact a doctor if:  Your symptoms are not helped by medicine.  You have a headache that feels different than the other headaches.  You feel sick to your stomach (nauseous) or you throw up (vomit).  You have a fever. Get help right away if:  Your headache gets very bad quickly.  Your headache gets worse after a lot of physical activity.  You keep throwing up.  You have a stiff neck.  You have trouble seeing.  You have trouble speaking.  You have pain in the eye or ear.  Your muscles are weak or you lose muscle control.  You lose your balance or have trouble walking.  You feel like you will pass out (faint) or you pass out.  You are mixed up (confused).  You have a seizure. Summary  A headache is pain or discomfort that is felt around the head or neck area.  There are many causes and   types of headaches. In some cases, the cause may not be found.  Keep a journal to help find out what causes your headaches. Watch your condition for any changes. Let your doctor know about them.  Contact a doctor if you have a headache that is different from usual, or if your headache is not helped by medicine.  Get help right away if your headache gets very bad, you throw up, you have trouble seeing, you lose your balance, or you have a seizure. This information is not intended to replace advice given to you by your health care provider. Make sure you discuss any questions you have with your health care provider. Document Revised: 12/02/2017 Document Reviewed: 12/02/2017 Elsevier Patient Education  2020 [employer]  

## 2019-12-24 NOTE — Progress Notes (Signed)
Subjective:  Patient ID: Dillon Young, male    DOB: August 03, 1932  Age: 86 y.o. MRN: [id number]  CC: Headache (Headache x 3 weeks)  This visit occurred during the SARS-CoV-2 public health emergency.  Safety protocols were in place, including screening questions prior to the visit, additional usage of staff PPE, and extensive cleaning of exam room while observing appropriate contact time as indicated for disinfecting solutions.    HPI LANKFORD GUTZMER presents for the complaint of a 3-week history of headache.  He complains of a discomfort on the top of his head that he describes as a throbbing sensation that is most prominent at night.  He says the pain keeps him away and he wants something for the pain.  He has had a mild postnasal drip but he denies facial pain, fever, chills, blurred vision, hearing disturbance, neck pain, or cough.  He has had chronic numbness in both feet.  He denies any other paresthesias.  Outpatient Medications Prior to Visit  Medication Sig Dispense Refill  . cholecalciferol (VITAMIN D) 1000 UNITS tablet Take 1,000 Units by mouth at bedtime.     . clobetasol (TEMOVATE) 0.05 % external solution     . dofetilide (TIKOSYN) 125 MCG capsule TAKE 1 CAPSULE BY MOUTH TWICE DAILY 180 capsule 3  . ELIQUIS 5 MG TABS tablet TAKE 1 TABLET BY MOUTH TWICE A DAY 180 tablet 1  . fexofenadine (ALLEGRA) 180 MG tablet Take 180 mg by mouth daily.    . fluticasone (FLONASE) 50 MCG/ACT nasal spray SPRAY 2 SPRAYS INTO EACH NOSTRIL EVERY DAY 48 mL 1  . Hypromellose (ARTIFICIAL TEARS OP) Place 1 drop into both eyes 4 (four) times daily as needed (dry eyes).    Marland Kitchen ipratropium (ATROVENT) 0.06 % nasal spray Place 2 sprays into both nostrils 4 (four) times daily. 15 mL 12  . ketoconazole (NIZORAL) 2 % cream SMARTSIG:1 Application Topical 1 to 2 Times Daily    . nitroGLYCERIN (NITROSTAT) 0.4 MG SL tablet Place 1 tablet (0.4 mg total) under the tongue every 5 (five) minutes as needed for chest  pain. 25 tablet 3  . rosuvastatin (CRESTOR) 10 MG tablet TAKE 1 TABLET (10 MG TOTAL) BY MOUTH DAILY. NEEDS APPT FOR FUTURE REFILLS 90 tablet 2  . sacubitril-valsartan (ENTRESTO) 97-103 MG Take 1 tablet by mouth 2 (two) times daily. 180 tablet 2  . spironolactone (ALDACTONE) 25 MG tablet Take 1 tablet (25 mg total) by mouth daily. Please call for office visit [phone] 90 tablet 1  . triamcinolone cream (KENALOG) 0.1 %      No facility-administered medications prior to visit.    ROS Review of Systems  Constitutional: Negative.  Negative for chills, diaphoresis, fatigue and fever.  HENT: Positive for rhinorrhea. Negative for facial swelling, hearing loss, nosebleeds, sinus pressure, sore throat and tinnitus.   Eyes: Negative.   Respiratory: Negative for cough, chest tightness, shortness of breath and wheezing.   Cardiovascular: Negative for chest pain, palpitations and leg swelling.  Gastrointestinal: Negative for abdominal pain, diarrhea, nausea and vomiting.  Endocrine: Negative.  Negative for polyuria.  Genitourinary: Negative.  Negative for difficulty urinating.  Musculoskeletal: Negative.  Negative for back pain, myalgias and neck pain.  Skin: Negative for color change and rash.  Neurological: Positive for numbness and headaches. Negative for dizziness, tremors, seizures, weakness and light-headedness.  Hematological: Negative for adenopathy. Does not bruise/bleed easily.  Psychiatric/Behavioral: Negative.     Objective:  BP (!) 158/84 (BP Location: Left Arm, Patient  Position: Sitting, Cuff Size: Normal)   Pulse 85   Temp 98.3 F (36.8 C) (Oral)   Ht 5\' 9"  (1.753 m)   Wt 177 lb (80.3 kg)   SpO2 96%   BMI 26.14 kg/m   BP Readings from Last 3 Encounters:  12/24/19 (!) 158/84  11/27/19 (!) 150/60  11/04/19 (!) 196/69    Wt Readings from Last 3 Encounters:  12/24/19 177 lb (80.3 kg)  11/27/19 180 lb (81.6 kg)  10/30/19 179 lb (81.2 kg)    Physical Exam Vitals  reviewed.  Constitutional:      Appearance: He is well-developed.  HENT:     Head: Normocephalic and atraumatic.     Mouth/Throat:     Mouth: Mucous membranes are moist.  Eyes:     Extraocular Movements: Extraocular movements intact.     Right eye: Normal extraocular motion and no nystagmus.     Left eye: Normal extraocular motion and no nystagmus.     Pupils: Pupils are equal, round, and reactive to light.     Right eye: Pupil is round and reactive.     Left eye: Pupil is round and reactive.  Cardiovascular:     Rate and Rhythm: Normal rate and regular rhythm.     Heart sounds: No murmur heard.   Pulmonary:     Effort: Pulmonary effort is normal.     Breath sounds: No stridor. No wheezing, rhonchi or rales.  Abdominal:     General: Abdomen is flat. Bowel sounds are normal. There is no distension.     Palpations: Abdomen is soft. There is no hepatomegaly, splenomegaly or mass.  Musculoskeletal:     Cervical back: Normal range of motion and neck supple.  Neurological:     Mental Status: He is alert.     Cranial Nerves: Cranial nerves are intact. No cranial nerve deficit.     Motor: Motor function is intact. No weakness.     Coordination: Coordination is intact. Romberg sign negative.     Gait: Gait is intact. Gait normal.     Deep Tendon Reflexes: Reflexes normal. Babinski sign absent on the right side. Babinski sign absent on the left side.     Reflex Scores:      Tricep reflexes are 0 on the right side and 0 on the left side.      Bicep reflexes are 0 on the right side and 0 on the left side.      Brachioradialis reflexes are 0 on the right side and 0 on the left side.      Patellar reflexes are 0 on the right side and 0 on the left side.      Achilles reflexes are 0 on the right side and 0 on the left side.    Lab Results  Component Value Date   WBC 4.5 10/30/2019   HGB 12.4 (L) 10/30/2019   HCT 40.4 10/30/2019   PLT 184 10/30/2019   GLUCOSE 82 10/30/2019   CHOL 113  07/15/2019   TRIG 156.0 (H) 07/15/2019   HDL 46.70 07/15/2019   LDLCALC 35 07/15/2019   ALT 8 07/15/2019   AST 14 07/15/2019   NA 141 10/30/2019   K 4.5 10/30/2019   CL 105 10/30/2019   CREATININE 1.05 10/30/2019   BUN 17 10/30/2019   CO2 26 10/30/2019   TSH 2.21 01/12/2019   PSA 0.00 Repeated and verified X2. (L) 07/15/2019   INR 1.1 12/18/2017   HGBA1C 6.1 07/15/2019  MICROALBUR 4.2 (H) 01/12/2019    ECHOCARDIOGRAM COMPLETE  Result Date: 11/04/2019    ECHOCARDIOGRAM REPORT   Patient Name:   FREDRIC SLABACH Date of Exam: 11/04/2019 Medical Rec #:  [id number]        Height:       69.0 in Accession #:    [id number]       Weight:       179.0 lb Date of Birth:  [birth year]        BSA:          1.971 m Patient Age:    36 years         BP:           196/69 mmHg Patient Gender: M                HR:           52 bpm. Exam Location:  Outpatient Procedure: 2D Echo, 3D Echo, Color Doppler, Cardiac Doppler and Strain Analysis Indications:    I50.9* Heart failure (unspecified)  History:        Patient has prior history of Echocardiogram examinations, most                 recent 06/18/2018. CHF, COPD; Risk Factors:Hypertension,                 Diabetes, Dyslipidemia and Sleep Apnea. AAA.  Sonographer:    Raquel Sarna Senior RDCS Referring Phys: [place]  1. Mild posterolateral hypokinesis. Left ventricular ejection fraction, by estimation, is 50 to 55%. The left ventricle has low normal function. The left ventricle demonstrates regional wall motion abnormalities (see scoring diagram/findings for description). The left ventricular internal cavity size was mildly dilated. Left ventricular diastolic parameters are consistent with Grade I diastolic dysfunction (impaired relaxation). Elevated left ventricular end-diastolic pressure.  2. Right ventricular systolic function is normal. The right ventricular size is normal. There is normal pulmonary artery systolic pressure.  3. Left atrial size was  severely dilated.  4. The mitral valve is normal in structure. No evidence of mitral valve regurgitation. No evidence of mitral stenosis.  5. The aortic valve is tricuspid. Aortic valve regurgitation is trivial. No aortic stenosis is present.  6. The inferior vena cava is normal in size with greater than 50% respiratory variability, suggesting right atrial pressure of 3 mmHg. FINDINGS  Left Ventricle: Mild posterolateral hypokinesis. Left ventricular ejection fraction, by estimation, is 50 to 55%. The left ventricle has low normal function. The left ventricle demonstrates regional wall motion abnormalities. The left ventricular internal cavity size was mildly dilated. There is no left ventricular hypertrophy. Left ventricular diastolic parameters are consistent with Grade I diastolic dysfunction (impaired relaxation). Elevated left ventricular end-diastolic pressure. Right Ventricle: The right ventricular size is normal. No increase in right ventricular wall thickness. Right ventricular systolic function is normal. There is normal pulmonary artery systolic pressure. The tricuspid regurgitant velocity is 2.05 m/s, and  with an assumed right atrial pressure of 3 mmHg, the estimated right ventricular systolic pressure is 02.6 mmHg. Left Atrium: Left atrial size was severely dilated. Right Atrium: Right atrial size was normal in size. Pericardium: There is no evidence of pericardial effusion. Mitral Valve: The mitral valve is normal in structure. Normal mobility of the mitral valve leaflets. No evidence of mitral valve regurgitation. No evidence of mitral valve stenosis. Tricuspid Valve: The tricuspid valve is normal in structure. Tricuspid valve regurgitation is trivial. No evidence of tricuspid stenosis.  Aortic Valve: The aortic valve is tricuspid. . There is mild thickening and mild calcification of the aortic valve. Aortic valve regurgitation is trivial. No aortic stenosis is present. There is mild thickening of the  aortic valve. There is mild calcification of the aortic valve. Pulmonic Valve: The pulmonic valve was normal in structure. Pulmonic valve regurgitation is not visualized. No evidence of pulmonic stenosis. Aorta: The aortic root is normal in size and structure. Venous: The inferior vena cava is normal in size with greater than 50% respiratory variability, suggesting right atrial pressure of 3 mmHg. IAS/Shunts: No atrial level shunt detected by color flow Doppler.  LEFT VENTRICLE PLAX 2D LVIDd:         5.60 cm  Diastology LVIDs:         4.30 cm  LV e' lateral:   5.66 cm/s LV PW:         0.70 cm  LV E/e' lateral: 13.4 LV IVS:        0.70 cm  LV e' medial:    4.24 cm/s LVOT diam:     1.80 cm  LV E/e' medial:  17.9 LV SV:         50 LV SV Index:   26 LVOT Area:     2.54 cm  RIGHT VENTRICLE RV S prime:     10.70 cm/s TAPSE (M-mode): 1.8 cm LEFT ATRIUM             Index       RIGHT ATRIUM           Index LA diam:        3.70 cm 1.88 cm/m  RA Area:     17.80 cm LA Vol (A2C):   93.4 ml 47.39 ml/m RA Volume:   44.40 ml  22.53 ml/m LA Vol (A4C):   65.1 ml 33.03 ml/m LA Biplane Vol: 81.8 ml 41.50 ml/m  AORTIC VALVE LVOT Vmax:   68.50 cm/s LVOT Vmean:  46.600 cm/s LVOT VTI:    0.198 m  AORTA Ao Root diam: 3.40 cm Ao Asc diam:  3.10 cm MITRAL VALVE               TRICUSPID VALVE MV Area (PHT): 2.62 cm    TR Peak grad:   16.8 mmHg MV Decel Time: 289 msec    TR Vmax:        205.00 cm/s MV E velocity: 75.80 cm/s MV A velocity: 86.50 cm/s  SHUNTS MV E/A ratio:  0.88        Systemic VTI:  0.20 m                            Systemic Diam: 1.80 cm Skeet Latch MD Electronically signed by Skeet Latch MD Signature Date/Time: 11/04/2019/11:01:38 AM    Final     Assessment & Plan:   Lorn was seen today for headache.  Diagnoses and all orders for this visit:  Essential hypertension- For his age, his blood pressure is adequately well controlled.  I do not think this is contributing to his headache.  Persistent  atrial fibrillation- He is maintaining sinus rhythm.  Chronic intractable headache, unspecified headache type- His neuro exam is nonfocal but with his symptoms and at his age he is high risk for mass and bleed.  I ordered a sed rate to screen for vasculitis and temporal arteritis.  I recommended that he undergo an MRI of the brain with  and without contrast to screen for mass, NPH, and bleed.  Will control the pain with tramadol. -     Sedimentation rate; Future -     MR Brain W Wo Contrast; Future -     traMADol (ULTRAM) 50 MG tablet; Take 1 tablet (50 mg total) by mouth every 6 (six) hours as needed. -     Sedimentation rate  Deficiency anemia- His hemoglobin is mildly low at 12.4.  I will screen him for vitamin deficiencies. -     CBC with Differential/Platelet; Future -     Reticulocytes; Future -     Iron; Future -     Vitamin B12; Future -     Vitamin B1; Future -     Folate; Future -     Ferritin; Future -     Ferritin -     Folate -     Vitamin B1 -     Vitamin B12 -     Iron -     Reticulocytes -     CBC with Differential/Platelet   I am having Princess Bruins. Hepler "Bill" start on traMADol. I am also having him maintain his cholecalciferol, Hypromellose (ARTIFICIAL TEARS OP), nitroGLYCERIN, ipratropium, triamcinolone cream, fexofenadine, Eliquis, spironolactone, sacubitril-valsartan, fluticasone, clobetasol, ketoconazole, rosuvastatin, and dofetilide.  Meds ordered this encounter  Medications  . traMADol (ULTRAM) 50 MG tablet    Sig: Take 1 tablet (50 mg total) by mouth every 6 (six) hours as needed.    Dispense:  75 tablet    Refill:  1   I spent 50 minutes in preparing to see the patient by review of recent labs, imaging and procedures, obtaining and reviewing separately obtained history, communicating with the patient and family or caregiver, ordering medications, tests or procedures, and documenting clinical information in the EHR including the differential Dx, treatment,  and any further evaluation and other management of 1. Essential hypertension 2. Persistent atrial fibrillation 3. Chronic intractable headache, unspecified headache type 4. Deficiency anemia     Follow-up: Return in about 4 weeks (around 01/21/2020).  [name], MD

## 2019-12-29 ENCOUNTER — Other Ambulatory Visit: Payer: Self-pay | Admitting: Internal Medicine

## 2019-12-29 ENCOUNTER — Encounter: Payer: Self-pay | Admitting: Internal Medicine

## 2019-12-29 DIAGNOSIS — D508 Other iron deficiency anemias: Secondary | ICD-10-CM | POA: Insufficient documentation

## 2019-12-29 LAB — CBC WITH DIFFERENTIAL/PLATELET
Absolute Monocytes: 510 cells/uL (ref 200–950)
Basophils Absolute: 31 cells/uL (ref 0–200)
Basophils Relative: 0.6 %
Eosinophils Absolute: 52 cells/uL (ref 15–500)
Eosinophils Relative: 1 %
HCT: 38.8 % (ref 38.5–50.0)
Hemoglobin: 12.1 g/dL — ABNORMAL LOW (ref 13.2–17.1)
Lymphs Abs: 816 cells/uL — ABNORMAL LOW (ref 850–3900)
MCH: 25.1 pg — ABNORMAL LOW (ref 27.0–33.0)
MCHC: 31.2 g/dL — ABNORMAL LOW (ref 32.0–36.0)
MCV: 80.5 fL (ref 80.0–100.0)
MPV: 10.6 fL (ref 7.5–12.5)
Monocytes Relative: 9.8 %
Neutro Abs: 3791 cells/uL (ref 1500–7800)
Neutrophils Relative %: 72.9 %
Platelets: 178 10*3/uL (ref 140–400)
RBC: 4.82 10*6/uL (ref 4.20–5.80)
RDW: 14.5 % (ref 11.0–15.0)
Total Lymphocyte: 15.7 %
WBC: 5.2 10*3/uL (ref 3.8–10.8)

## 2019-12-29 LAB — VITAMIN B1: Vitamin B1 (Thiamine): 13 nmol/L (ref 8–30)

## 2019-12-29 LAB — RETICULOCYTES
ABS Retic: 33740 cells/uL (ref 25000–9000)
Retic Ct Pct: 0.7 %

## 2019-12-29 LAB — FOLATE: Folate: 21.4 ng/mL

## 2019-12-29 LAB — SEDIMENTATION RATE: Sed Rate: 22 mm/h — ABNORMAL HIGH (ref 0–20)

## 2019-12-29 LAB — FERRITIN: Ferritin: 12 ng/mL — ABNORMAL LOW (ref 24–380)

## 2019-12-29 LAB — VITAMIN B12: Vitamin B-12: 212 pg/mL (ref 200–1100)

## 2019-12-29 LAB — IRON: Iron: 27 ug/dL — ABNORMAL LOW (ref 50–180)

## 2019-12-29 MED ORDER — FERRALET 90 90-1 MG PO TABS: 1.0000 | ORAL_TABLET | Freq: Every day | ORAL | 1 refills | Status: DC

## 2019-12-30 ENCOUNTER — Other Ambulatory Visit: Payer: Self-pay | Admitting: Internal Medicine

## 2019-12-30 DIAGNOSIS — D508 Other iron deficiency anemias: Secondary | ICD-10-CM

## 2019-12-30 MED ORDER — FERROUS SULFATE 325 (65 FE) MG PO TABS: 325.0000 mg | ORAL_TABLET | Freq: Two times a day (BID) | ORAL | 1 refills | Status: DC

## 2019-12-31 ENCOUNTER — Emergency Department (HOSPITAL_COMMUNITY)
Admission: EM | Admit: 2019-12-31 | Discharge: 2019-12-31 | Disposition: A | Discharge status: Home / Self Care | Payer: HMO | Attending: Emergency Medicine | Admitting: Emergency Medicine

## 2019-12-31 ENCOUNTER — Emergency Department (HOSPITAL_COMMUNITY): Payer: HMO

## 2019-12-31 ENCOUNTER — Telehealth: Payer: Self-pay | Admitting: Internal Medicine

## 2019-12-31 ENCOUNTER — Other Ambulatory Visit: Payer: Self-pay

## 2019-12-31 ENCOUNTER — Encounter (HOSPITAL_COMMUNITY): Payer: Self-pay | Admitting: Emergency Medicine

## 2019-12-31 ENCOUNTER — Encounter: Payer: Self-pay | Admitting: Internal Medicine

## 2019-12-31 DIAGNOSIS — K8020 Calculus of gallbladder without cholecystitis without obstruction: Secondary | ICD-10-CM | POA: Diagnosis not present

## 2019-12-31 DIAGNOSIS — D508 Other iron deficiency anemias: Secondary | ICD-10-CM | POA: Insufficient documentation

## 2019-12-31 DIAGNOSIS — R519 Headache, unspecified: Secondary | ICD-10-CM | POA: Diagnosis not present

## 2019-12-31 DIAGNOSIS — Z86010 Personal history of colonic polyps: Secondary | ICD-10-CM | POA: Insufficient documentation

## 2019-12-31 DIAGNOSIS — R5381 Other malaise: Secondary | ICD-10-CM | POA: Diagnosis not present

## 2019-12-31 DIAGNOSIS — Z20822 Contact with and (suspected) exposure to covid-19: Secondary | ICD-10-CM | POA: Diagnosis not present

## 2019-12-31 DIAGNOSIS — I509 Heart failure, unspecified: Secondary | ICD-10-CM | POA: Insufficient documentation

## 2019-12-31 DIAGNOSIS — K5900 Constipation, unspecified: Secondary | ICD-10-CM | POA: Diagnosis not present

## 2019-12-31 DIAGNOSIS — C61 Malignant neoplasm of prostate: Secondary | ICD-10-CM | POA: Diagnosis not present

## 2019-12-31 DIAGNOSIS — I130 Hypertensive heart and chronic kidney disease with heart failure and stage 1 through stage 4 chronic kidney disease, or unspecified chronic kidney disease: Secondary | ICD-10-CM | POA: Diagnosis not present

## 2019-12-31 DIAGNOSIS — I2510 Atherosclerotic heart disease of native coronary artery without angina pectoris: Secondary | ICD-10-CM | POA: Diagnosis not present

## 2019-12-31 DIAGNOSIS — R1032 Left lower quadrant pain: Secondary | ICD-10-CM | POA: Diagnosis not present

## 2019-12-31 DIAGNOSIS — E785 Hyperlipidemia, unspecified: Secondary | ICD-10-CM | POA: Diagnosis not present

## 2019-12-31 DIAGNOSIS — R531 Weakness: Secondary | ICD-10-CM | POA: Diagnosis not present

## 2019-12-31 DIAGNOSIS — N189 Chronic kidney disease, unspecified: Secondary | ICD-10-CM | POA: Diagnosis not present

## 2019-12-31 DIAGNOSIS — I700 Atherosclerosis of aorta: Secondary | ICD-10-CM | POA: Diagnosis not present

## 2019-12-31 DIAGNOSIS — Z87891 Personal history of nicotine dependence: Secondary | ICD-10-CM | POA: Diagnosis not present

## 2019-12-31 DIAGNOSIS — I10 Essential (primary) hypertension: Secondary | ICD-10-CM | POA: Diagnosis not present

## 2019-12-31 DIAGNOSIS — R110 Nausea: Secondary | ICD-10-CM | POA: Diagnosis not present

## 2019-12-31 DIAGNOSIS — E1122 Type 2 diabetes mellitus with diabetic chronic kidney disease: Secondary | ICD-10-CM | POA: Diagnosis not present

## 2019-12-31 DIAGNOSIS — R109 Unspecified abdominal pain: Secondary | ICD-10-CM | POA: Diagnosis not present

## 2019-12-31 DIAGNOSIS — Z7901 Long term (current) use of anticoagulants: Secondary | ICD-10-CM | POA: Diagnosis not present

## 2019-12-31 LAB — COMPREHENSIVE METABOLIC PANEL
ALT: 19 U/L (ref 0–44)
AST: 32 U/L (ref 15–41)
Albumin: 4.3 g/dL (ref 3.5–5.0)
Alkaline Phosphatase: 51 U/L (ref 38–126)
Anion gap: 13 (ref 5–15)
BUN: 22 mg/dL (ref 8–23)
CO2: 24 mmol/L (ref 22–32)
Calcium: 9.6 mg/dL (ref 8.9–10.3)
Chloride: 102 mmol/L (ref 98–111)
Creatinine, Ser: 1.21 mg/dL (ref 0.61–1.24)
GFR calc Af Amer: >60 mL/min (ref >60)
GFR calc non Af Amer: 54 mL/min — ABNORMAL LOW (ref >60)
Glucose, Bld: 103 mg/dL — ABNORMAL HIGH (ref 70–99)
Potassium: 4.5 mmol/L (ref 3.5–5.1)
Sodium: 139 mmol/L (ref 135–145)
Total Bilirubin: 1 mg/dL (ref 0.3–1.2)
Total Protein: 7.7 g/dL (ref 6.5–8.1)

## 2019-12-31 LAB — CBC WITH DIFFERENTIAL/PLATELET
Abs Immature Granulocytes: 0.03 10*3/uL (ref 0.00–0.07)
Basophils Absolute: 0 10*3/uL (ref 0.0–0.1)
Basophils Relative: 0 %
Eosinophils Absolute: 0 10*3/uL (ref 0.0–0.5)
Eosinophils Relative: 0 %
HCT: 41.3 % (ref 39.0–52.0)
Hemoglobin: 12.5 g/dL — ABNORMAL LOW (ref 13.0–17.0)
Immature Granulocytes: 0 %
Lymphocytes Relative: 2 %
Lymphs Abs: 0.2 10*3/uL — ABNORMAL LOW (ref 0.7–4.0)
MCH: 24.4 pg — ABNORMAL LOW (ref 26.0–34.0)
MCHC: 30.3 g/dL (ref 30.0–36.0)
MCV: 80.7 fL (ref 80.0–100.0)
Monocytes Absolute: 0.9 10*3/uL (ref 0.1–1.0)
Monocytes Relative: 8 %
Neutro Abs: 9.6 10*3/uL — ABNORMAL HIGH (ref 1.7–7.7)
Neutrophils Relative %: 90 %
Platelets: 156 10*3/uL (ref 150–400)
RBC: 5.12 MIL/uL (ref 4.22–5.81)
RDW: 15.4 % (ref 11.5–15.5)
WBC: 10.8 10*3/uL — ABNORMAL HIGH (ref 4.0–10.5)
nRBC: 0 % (ref 0.0–0.2)

## 2019-12-31 LAB — URINALYSIS, ROUTINE W REFLEX MICROSCOPIC
Bacteria, UA: NONE SEEN
Bilirubin Urine: NEGATIVE
Glucose, UA: NEGATIVE mg/dL
Ketones, ur: NEGATIVE mg/dL
Leukocytes,Ua: NEGATIVE
Nitrite: NEGATIVE
Protein, ur: NEGATIVE mg/dL
RBC / HPF: >50 RBC/hpf — ABNORMAL HIGH (ref 0–5)
Specific Gravity, Urine: 1.013 (ref 1.005–1.030)
pH: 6 (ref 5.0–8.0)

## 2019-12-31 LAB — LIPASE, BLOOD: Lipase: 26 U/L (ref 11–51)

## 2019-12-31 LAB — SARS CORONAVIRUS 2 BY RT PCR (HOSPITAL ORDER, PERFORMED IN ~~LOC~~ HOSPITAL LAB): SARS Coronavirus 2: NEGATIVE

## 2019-12-31 MED ORDER — IOHEXOL 300 MG/ML  SOLN
100.0000 mL | Freq: Once | INTRAMUSCULAR | Status: AC | PRN
  Administered 2019-12-31: 100 mL via INTRAVENOUS

## 2019-12-31 NOTE — ED Provider Notes (Signed)
[facility] EMERGENCY DEPARTMENT Provider Note   CSN: [id number] Arrival date & time: 12/31/19  1517     History Chief Complaint  Patient presents with   Weakness    Dillon Young is a 86 y.o. male.  HPI 86 year old male with a history of polyps, CHF with an EF of 5055%, CKD, prostate cancer, DM type II, hypertension, hyperlipidemia presents to the ER with a fairly sudden onset of left lower quadrant pain which occurred at 2 AM originally. Patient states that the pain was a 8/10, sharp, woke him up from his sleep. He states he took some Bentyl and went to bed. He states he woke up in the morning, and continued to have this pain but with less severity and intensity and pain. He endorses some nausea, but no vomiting. He states he feels like he has a fever, but no direct measurable fevers. He states he has a history of constipation, last bowel movement was yesterday and hard. He denies any dysuria or hematuria. No chest pain, cough, or shortness of breath. He also endorses a headache over the last 2 weeks, states that he was given really opioid prescription from his PCP. Has a pending MRI in 2 weeks per his wife. He does not have a headache at this moment. He denies any vision changes, unilateral weakness or numbness. Patient also was told that he has anemia by his PCP, not taken any of the iron supplements that his PCP is prescribed. He has not had any hematochezia, rectal bleeding, endorses occasional bleeding when he passes a prostate stone, but no recent bleeding.    Past Medical History:  Diagnosis Date   Adenomatous colon polyp    Allergic rhinitis    BPH (benign prostatic hyperplasia)    CAD (coronary artery disease)    sees Dr. [name]    Carotid artery disease [facility])    CHF (congestive heart failure) ([place])    Chronic kidney disease    [place]. Prostate cancer   Clotting disorder (HCC)    CVA (cerebral infarction)    Diabetes mellitus     "Borderline"   Diverticulitis    GERD (gastroesophageal reflux disease)    history   Hemorrhoids    HTN (hypertension)    Hyperlipidemia LDL goal <70    IBS (irritable bowel syndrome)    Low back pain    Prostate cancer ([place]) 2008   seed implantation   Sleep apnea    last study was aborted.  Has never worn a CPAP.     Smoker     Patient Active Problem List   Diagnosis Date Noted   Anemia, iron deficiency, inadequate dietary intake 12/29/2019   Deficiency anemia 12/24/2019   Intrinsic eczema 01/12/2019   Chronic intractable headache 06/13/2018   CAD (coronary artery disease) 12/26/2017   Left ventricular dysfunction 11/06/2017   Persistent atrial fibrillation 10/02/2017   Aortic atherosclerosis ([place]) 05/02/2017   Primary osteoarthritis of both knees 03/13/2016   Bradycardia 12/15/2015   Routine general medical examination at a health care facility 08/16/2015   Middle insomnia 08/15/2015   Carotid artery disease- s/p RCE June 2013 11/01/2011   PVC's (premature ventricular contractions) 05/07/2011   Radiation proctitis 03/30/2011   Hemorrhoids, internal, with bleeding, prolapse and itching 02/02/2011   Constipation, chronic 02/02/2011   Cerebrovascular disease 09/08/2008   Allergic rhinitis 09/08/2008   Diabetes mellitus without complication ([place]) 25/09/3974   Prostate cancer ([place]) 09/06/2008    Class: History of  Hyperlipidemia with target LDL less than 70 07/02/2008   Essential hypertension 07/02/2008   GERD 07/02/2008   Irritable bowel syndrome 07/02/2008   LOW BACK PAIN SYNDROME 07/02/2008    Past Surgical History:  Procedure Laterality Date   APPENDECTOMY     CARDIAC CATHETERIZATION N/A 01/16/2016   Procedure: Left Heart Cath and Coronary Angiography;  Surgeon: Jettie Booze, MD;  Location: [place] [facility];  Service: Cardiovascular;  Laterality: N/A;   CARDIAC CATHETERIZATION N/A 01/16/2016   Procedure: Coronary  Stent Intervention;  Surgeon: Jettie Booze, MD;  Location: [place] [facility];  Service: Cardiovascular;  Laterality: N/A;   CARDIOVERSION N/A 11/26/2017   Procedure: CARDIOVERSION;  Surgeon: [name], MD;  Location: [facility] ENDOSCOPY;  Service: Cardiovascular;  Laterality: N/A;   CARDIOVERSION N/A 03/13/2018   Procedure: CARDIOVERSION;  Surgeon: [name], MD;  Location: [facility] ENDOSCOPY;  Service: Cardiovascular;  Laterality: N/A;   CATARACT EXTRACTION W/ INTRAOCULAR LENS  IMPLANT, BILATERAL Bilateral    COLONOSCOPY  10/21/2008   diverticulosis, radiation proctitis, internal hemorrhoids   CORONARY STENT INTERVENTION N/A 12/26/2017   Procedure: CORONARY STENT INTERVENTION;  Surgeon: [name], MD;  Location: [place] [facility];  Service: Cardiovascular;  Laterality: N/A;   ENDARTERECTOMY  11/12/2011   Procedure: ENDARTERECTOMY CAROTID;  Surgeon: Rosetta Posner, MD;  Location: [place];  Service: Vascular;  Laterality: Right;   ESOPHAGOGASTRODUODENOSCOPY (EGD) WITH ESOPHAGEAL DILATION     HEMORRHOID BANDING     LEFT HEART CATH AND CORONARY ANGIOGRAPHY N/A 12/26/2017   Procedure: LEFT HEART CATH AND CORONARY ANGIOGRAPHY;  Surgeon: [name], MD;  Location: [place] [facility];  Service: Cardiovascular;  Laterality: N/A;   LITHOTRIPSY  X 3   LUMBAR LAMINECTOMY     TRANSURETHRAL RESECTION OF PROSTATE  10/2003       Family History  Problem Relation Age of Onset   Heart disease Father    Hypertension Mother    Asthma Paternal Grandmother    Colon cancer Neg Hx    Esophageal cancer Neg Hx    Stomach cancer Neg Hx    Rectal cancer Neg Hx    Liver cancer Neg Hx     Social History   Tobacco Use   Smoking status: Former Smoker    Packs/day: 1.50    Years: 60.00    Pack years: 90.00    Types: Cigarettes    Quit date: 07/25/2008    Years since quitting: 11.4   Smokeless tobacco: Never Used  Substance Use Topics   Alcohol use: Yes     Alcohol/week: 0.0 standard drinks    Comment: Occ   Drug use: No    Home Medications Prior to Admission medications   Medication Sig Start Date End Date Taking? Authorizing Provider  acetaminophen (TYLENOL) 500 MG tablet Take 500-1,000 mg by mouth every 6 (six) hours as needed (for headaches).   Yes [provider]  Cholecalciferol (VITAMIN D-3) 25 MCG (1000 UT) CAPS Take 1,000 Units by mouth daily.   Yes [provider]  clobetasol (TEMOVATE) 0.05 % external solution Apply 1 application topically See admin instructions. Apply to affected areas of the back twice weekly 10/08/19  Yes [provider]  dofetilide (TIKOSYN) 125 MCG capsule TAKE 1 CAPSULE BY MOUTH TWICE DAILY Patient taking differently: Take 125 mcg by mouth 2 (two) times daily.  12/08/19  Yes Larey Dresser, MD  ELIQUIS 5 MG TABS tablet TAKE 1 TABLET BY MOUTH TWICE  A DAY Patient taking differently: Take 5 mg by mouth 2 (two) times daily.  08/18/19  Yes [name], MD  fexofenadine (ALLEGRA) 180 MG tablet Take 180 mg by mouth daily as needed for allergies or rhinitis.    Yes [provider]  fluticasone (FLONASE) 50 MCG/ACT nasal spray SPRAY 2 SPRAYS INTO EACH NOSTRIL EVERY DAY Patient taking differently: Place 2 sprays into both nostrils daily as needed for allergies or rhinitis.  09/26/19  Yes [name], MD  Hypromellose (ARTIFICIAL TEARS OP) Place 1 drop into both eyes 4 (four) times daily as needed (dry eyes).   Yes [provider]  ipratropium (ATROVENT) 0.06 % nasal spray Place 2 sprays into both nostrils 4 (four) times daily. Patient taking differently: Place 2 sprays into both nostrils 4 (four) times daily as needed for rhinitis.  04/16/18  Yes Marrian Salvage, FNP  levocetirizine (XYZAL) 5 MG tablet Take 5 mg by mouth daily as needed for allergies.   Yes [provider]  nitroGLYCERIN (NITROSTAT) 0.4 MG SL tablet Place 1 tablet (0.4 mg total) under the  tongue every 5 (five) minutes as needed for chest pain. 12/02/17  Yes Barrett, Evelene Croon, PA-C  rosuvastatin (CRESTOR) 10 MG tablet TAKE 1 TABLET (10 MG TOTAL) BY MOUTH DAILY. NEEDS APPT FOR FUTURE REFILLS Patient taking differently: Take 10 mg by mouth at bedtime.  11/04/19  Yes [name], MD  sacubitril-valsartan (ENTRESTO) 97-103 MG Take 1 tablet by mouth 2 (two) times daily. 09/23/19  Yes [name], MD  sodium chloride (OCEAN) 0.65 % SOLN nasal spray Place 1-2 sprays into both nostrils as needed for congestion.   Yes [provider]  spironolactone (ALDACTONE) 25 MG tablet Take 1 tablet (25 mg total) by mouth daily. Please call for office visit [phone] Patient taking differently: Take 25 mg by mouth daily.  09/02/19  Yes Larey Dresser, MD  traMADol (ULTRAM) 50 MG tablet Take 1 tablet (50 mg total) by mouth every 6 (six) hours as needed. Patient taking differently: Take 25-50 mg by mouth every 6 (six) hours as needed (for pain or headaches).  12/24/19  Yes [name], MD  triamcinolone cream (KENALOG) 0.1 % Apply 1 application topically daily as needed (to affected areas, for itching).  01/06/19  Yes [provider]  cholecalciferol (VITAMIN D) 1000 UNITS tablet Take 1,000 Units by mouth at bedtime.  Patient not taking: Reported on 12/31/2019    [provider]  ferrous sulfate 325 (65 FE) MG tablet Take 1 tablet (325 mg total) by mouth 2 (two) times daily with a meal. Patient not taking: Reported on 12/31/2019 12/30/19   [name], MD  ketoconazole (NIZORAL) 2 % cream SMARTSIG:1 Application Topical 1 to 2 Times Daily Patient not taking: Reported on 12/31/2019 08/07/19   [provider]  fluticasone (FLONASE) 50 MCG/ACT nasal spray Place 2 sprays into both nostrils daily. 08/21/19   [name], MD  fluticasone [facility]) 50 MCG/ACT nasal spray SPRAY 2 SPRAYS INTO EACH NOSTRIL EVERY DAY 09/13/19   [name], MD    Allergies    Lipitor  [atorvastatin] and Other  Review of Systems   Review of Systems  Constitutional: Negative for chills and fever.  HENT: Negative for ear pain and sore throat.   Eyes: Negative for pain and visual disturbance.  Respiratory: Negative for cough and shortness of breath.   Cardiovascular: Negative for chest pain and palpitations.  Gastrointestinal: Positive  for abdominal pain, constipation and nausea. Negative for anal bleeding, blood in stool, rectal pain and vomiting.  Genitourinary: Negative for dysuria and hematuria.  Musculoskeletal: Negative for arthralgias and back pain.  Skin: Negative for color change and rash.  Neurological: Positive for headaches. Negative for seizures and syncope.  All other systems reviewed and are negative.   Physical Exam Updated Vital Signs BP (!) 124/51    Pulse 90    Temp 99 F (37.2 C) (Oral)    Resp (!) 22    Ht 5\' 9"  (1.753 m)    Wt 80.3 kg    SpO2 97%    BMI 26.14 kg/m   Physical Exam Vitals and nursing note reviewed.  Constitutional:      Appearance: He is well-developed.  HENT:     Head: Normocephalic and atraumatic.     Comments: No neck stiffness, negative Brudzinski's    Mouth/Throat:     Mouth: Mucous membranes are moist.     Pharynx: Oropharynx is clear.  Eyes:     Conjunctiva/sclera: Conjunctivae normal.  Cardiovascular:     Rate and Rhythm: Normal rate and regular rhythm.     Pulses: Normal pulses.     Heart sounds: Normal heart sounds. No murmur heard.   Pulmonary:     Effort: Pulmonary effort is normal. No respiratory distress.     Breath sounds: Normal breath sounds.  Abdominal:     General: Abdomen is flat.     Palpations: Abdomen is soft.     Tenderness: There is abdominal tenderness in the left lower quadrant. There is rebound. There is no right CVA tenderness or left CVA tenderness.     Hernia: No hernia is present.    Musculoskeletal:        General: No swelling or tenderness. Normal range of motion.     Cervical  back: Normal range of motion and neck supple.  Skin:    General: Skin is warm and dry.  Neurological:     General: No focal deficit present.     Mental Status: He is alert and oriented to person, place, and time.     Sensory: No sensory deficit.     Motor: No weakness.     Comments: Mental Status:  Alert, thought content appropriate, able to give a coherent history. Speech fluent without evidence of aphasia. Able to follow 2 step commands without difficulty.  Cranial Nerves:  II: Peripheral visual fields grossly normal, pupils equal, round, reactive to light III,IV, VI: ptosis not present, extra-ocular motions intact bilaterally  V,VII: smile symmetric, facial light touch sensation equal VIII: hearing grossly normal to voice  X: uvula elevates symmetrically  XI: bilateral shoulder shrug symmetric and strong XII: midline tongue extension without fassiculations Motor:  Normal tone. 5/5 strength of BUE and BLE major muscle groups including strong and equal grip strength and dorsiflexion/plantar flexion Sensory: light touch normal in all extremities. Cerebellar: normal finger-to-nose with bilateral upper extremities, Romberg sign absent Gait: not accessed   Psychiatric:        Mood and Affect: Mood normal.        Behavior: Behavior normal.     ED Results / Procedures / Treatments   Labs (all labs ordered are listed, but only abnormal results are displayed) Labs Reviewed  CBC WITH DIFFERENTIAL/PLATELET - Abnormal; Notable for the following components:      Result Value   WBC 10.8 (*)    Hemoglobin 12.5 (*)    MCH 24.4 (*)  Neutro Abs 9.6 (*)    Lymphs Abs 0.2 (*)    All other components within normal limits  COMPREHENSIVE METABOLIC PANEL - Abnormal; Notable for the following components:   Glucose, Bld 103 (*)    GFR calc non Af Amer 54 (*)    All other components within normal limits  URINALYSIS, ROUTINE W REFLEX MICROSCOPIC - Abnormal; Notable for the following  components:   Hgb urine dipstick LARGE (*)    RBC / HPF >50 (*)    All other components within normal limits  SARS CORONAVIRUS 2 BY RT PCR [facility] ORDER, [place] LAB)  LIPASE, BLOOD    EKG EKG Interpretation  Date/Time:  Thursday December 31 2019 15:35:42 EDT Ventricular Rate:  93 PR Interval:    QRS Duration: 100 QT Interval:  355 QTC Calculation: 442 R Axis:   -49 Text Interpretation: Sinus rhythm Prolonged PR interval LAD, consider left anterior fascicular block Abnormal R-wave progression, late transition Confirmed by Virgel Manifold [phone]) on 12/31/2019 6:42:51 PM   Radiology DG Chest 1 View  Result Date: 12/31/2019 CLINICAL DATA:  Weakness and abdominal pain. EXAM: CHEST  1 VIEW COMPARISON:  August 14, 2017 FINDINGS: There is no evidence of acute infiltrate, pleural effusion or pneumothorax. The heart size and mediastinal contours are within normal limits. There is mild calcification of the aortic arch. Degenerative changes seen throughout the thoracic spine. IMPRESSION: No active disease. Electronically Signed   By: Virgina Norfolk M.D.   On: 12/31/2019 16:01   CT ABDOMEN PELVIS W CONTRAST  Result Date: 12/31/2019 CLINICAL DATA:  Left lower quadrant pain. EXAM: CT ABDOMEN AND PELVIS WITH CONTRAST TECHNIQUE: Multidetector CT imaging of the abdomen and pelvis was performed using the standard protocol following bolus administration of intravenous contrast. CONTRAST:  132mL OMNIPAQUE IOHEXOL 300 MG/ML  SOLN COMPARISON:  09/10/2017 FINDINGS: Lower chest: The lung bases are clear. The heart size is normal. Hepatobiliary: The liver is normal. Cholelithiasis without acute inflammation.There is no biliary ductal dilation. Pancreas: Normal contours without ductal dilatation. No peripancreatic fluid collection. Spleen: Unremarkable. Adrenals/Urinary Tract: --Adrenal glands: Unremarkable. --Right kidney/ureter: Multiple cystic appearing lesions are noted throughout the  right kidney, many of which are too small to characterize. The majority are likely simple cysts. There is no right-sided hydronephrosis. --Left kidney/ureter: Multiple left-sided cysts are noted. There is a nonobstructing stone in the lower pole measuring approximately 1 cm. --Urinary bladder: Unremarkable. Stomach/Bowel: --Stomach/Duodenum: No hiatal hernia or other gastric abnormality. Normal duodenal course and caliber. --Small bowel: Unremarkable. --Colon: There is scattered colonic diverticula without CT evidence for diverticulitis. --Appendix: Not visualized. No right lower quadrant inflammation or free fluid. Vascular/Lymphatic: Atherosclerotic calcification is present within the non-aneurysmal abdominal aorta, without hemodynamically significant stenosis. --No retroperitoneal lymphadenopathy. --No mesenteric lymphadenopathy. --No pelvic or inguinal lymphadenopathy. Reproductive: Unremarkable Other: No ascites or free air. There are bilateral fat containing inguinal hernias. Musculoskeletal. No acute displaced fractures. IMPRESSION: 1. No acute abdominopelvic abnormality. 2. Cholelithiasis without acute inflammation. 3. Bilateral fat containing inguinal hernias. Aortic Atherosclerosis (ICD10-I70.0). Electronically Signed   By: Constance Holster M.D.   On: 12/31/2019 18:04    Procedures Procedures (including critical care time)  Medications Ordered in ED Medications  iohexol (OMNIPAQUE) 300 MG/ML solution 100 mL (100 mLs Intravenous Contrast Given 12/31/19 1736)    ED Course  I have reviewed the triage vital signs and the nursing notes.  Pertinent labs & imaging results that were available during my care of the patient were reviewed  by me and considered in my medical decision making (see chart for details).    MDM Rules/Calculators/A&P                         86 year old male with abdominal pain which began this morning, headache x2 weeks, generalized weakness, feeling feverish. On  presentation, the patient is alert, oriented, nontoxic-appearing, no acute distress. He arrived with a borderline fever of 99, slightly hypertensive 140/57, not tachypneic or hypoxic. Physical exam with rebound on the right side, tenderness in the left lower quadrant. No focal or gross neuro deficits on exam. Patient does not endorse a headache at this time.  No neck stiffness on exam.  I personally reviewed and interpreted his lab work, which showed: CBC with mild leukocytosis of 10.8. Hemoglobin of 12.5; slightly improved from values 1 week ago.  CMP without any electrolyte abnormalities, normal renal and liver function. Lipase normal. UA with evidence of large blood, negative for leukocytes or WBCs.  IMAGING:  Chest x-ray without evidence of pneumonia, pneumothorax, no evidence of widened mediastinum. Given the patient had abdominal tenderness on exam, pending CT abdomen pelvis. CT without evidence of acute abdominal pelvic abnormality.  EKG with normal sinus rhythm, long QT noted.  No significant change from previous.  On reevaluation, patient notes just "soreness" to his left lower quadrant, no pain.  Patient was swabbed for Covid, though he is vaccinated with the [place] vaccine.  Given reassuring work-up without any acute intra-abdominal pathology, no evidence of UTI; pneumonia,  to explain his borderline fever, complete the patient is stable for discharge with strict return precautions.  Per the patient, he gets frequent bouts of abdominal pain and is prescribed Bentyl.  Encouraged him to follow-up with his PCP about his symptoms.  All the patient's questions have been answered to his satisfaction, he voices understanding is agreeable to this plan.  At this stage in the ED course, the patient has been medically screened and is stable for discharge  The patient was seen and evaluated by Dr. Wilson Singer who is agreeable to the above plan and disposition. Final Clinical Impression(s) / ED  Diagnoses Final diagnoses:  Left lower quadrant abdominal pain    Rx / DC Orders ED Discharge Orders    None       Lyndel Safe 12/31/19 1844    Virgel Manifold, MD 01/01/20 1625

## 2019-12-31 NOTE — Telephone Encounter (Signed)
lvm for pt or spouse to call back.   Pt did end up in the ED today arriving at 02:17pm.

## 2019-12-31 NOTE — Telephone Encounter (Signed)
New message:   Pt's wife is calling and states the pt has the shakes. She states the EMS was called out to the house but they didn't take him. She states they just need a call back as soon as possible. Please advise.

## 2019-12-31 NOTE — Discharge Instructions (Signed)
Your work-up today was overall reassuring.  You were swabbed for COVID-19.  You may check the results of the test via MyChart.  If this is positive, you will need to quarantine an additional 10 days.  Continue to take Bentyl as needed.  Please make sure to return to the ER for any worsening symptoms.  Please follow-up with your primary care doctor about the symptoms you are experiencing today.

## 2019-12-31 NOTE — ED Triage Notes (Signed)
Pt was dx with anemia at PCP, given iron supp to take yesterday, was unable to get prescription filled yet. Pt reports gen weakness, abd pain, h/a for past 2 weeks, has 99.7 temp.

## 2020-01-05 ENCOUNTER — Ambulatory Visit: Payer: HMO | Admitting: Cardiovascular Disease

## 2020-01-06 ENCOUNTER — Encounter: Payer: Self-pay | Admitting: Internal Medicine

## 2020-01-06 ENCOUNTER — Telehealth: Payer: Self-pay | Admitting: Cardiovascular Disease

## 2020-01-06 ENCOUNTER — Ambulatory Visit (INDEPENDENT_AMBULATORY_CARE_PROVIDER_SITE_OTHER): Payer: HMO | Admitting: Internal Medicine

## 2020-01-06 ENCOUNTER — Other Ambulatory Visit: Payer: Self-pay

## 2020-01-06 VITALS — BP 146/60 | HR 60 | Temp 97.8°F | Resp 16 | Ht 69.0 in | Wt 175.4 lb

## 2020-01-06 DIAGNOSIS — I10 Essential (primary) hypertension: Secondary | ICD-10-CM

## 2020-01-06 DIAGNOSIS — E119 Type 2 diabetes mellitus without complications: Secondary | ICD-10-CM | POA: Diagnosis not present

## 2020-01-06 DIAGNOSIS — D508 Other iron deficiency anemias: Secondary | ICD-10-CM

## 2020-01-06 NOTE — Telephone Encounter (Signed)
Patient's wife aware he can drop off paperwork anytime but MD will not be back in office until 8/17 to sign off on forms.

## 2020-01-06 NOTE — Progress Notes (Signed)
Subjective:  Patient ID: Dillon Young, male    DOB: 1932-08-02  Age: 86 y.o. MRN: [id number]  CC: Anemia  This visit occurred during the SARS-CoV-2 public health emergency.  Safety protocols were in place, including screening questions prior to the visit, additional usage of staff PPE, and extensive cleaning of exam room while observing appropriate contact time as indicated for disinfecting solutions.    HPI Dillon Young presents for f/up -he was recently seen in the emergency department for abdominal pain and other constitutional symptoms.  He had a CT scan of the abdomen which was reassuring.  He was diagnosed with IBS and a viral syndrome.  Is gotten symptom relief with Bentyl and tramadol.  He said no more abdominal pain and he denies cough, fever, chills, nausea, vomiting, rash, or paresthesias.  He tells me he is taking the iron supplement but continues to complain of fatigue and shortness of breath.  Outpatient Medications Prior to Visit  Medication Sig Dispense Refill  . acetaminophen (TYLENOL) 500 MG tablet Take 500-1,000 mg by mouth every 6 (six) hours as needed (for headaches).    . cholecalciferol (VITAMIN D) 1000 UNITS tablet Take 1,000 Units by mouth at bedtime.      . Cholecalciferol (VITAMIN D-3) 25 MCG (1000 UT) CAPS Take 1,000 Units by mouth daily.    . clobetasol (TEMOVATE) 0.05 % external solution Apply 1 application topically See admin instructions. Apply to affected areas of the back twice weekly    . dofetilide (TIKOSYN) 125 MCG capsule TAKE 1 CAPSULE BY MOUTH TWICE DAILY (Patient taking differently: Take 125 mcg by mouth 2 (two) times daily. ) 180 capsule 3  . ELIQUIS 5 MG TABS tablet TAKE 1 TABLET BY MOUTH TWICE A DAY (Patient taking differently: Take 5 mg by mouth 2 (two) times daily. ) 180 tablet 1  . ferrous sulfate 325 (65 FE) MG tablet Take 1 tablet (325 mg total) by mouth 2 (two) times daily with a meal. 180 tablet 1  . fexofenadine (ALLEGRA) 180 MG tablet  Take 180 mg by mouth daily as needed for allergies or rhinitis.     . fluticasone (FLONASE) 50 MCG/ACT nasal spray SPRAY 2 SPRAYS INTO EACH NOSTRIL EVERY DAY (Patient taking differently: Place 2 sprays into both nostrils daily as needed for allergies or rhinitis. ) 48 mL 1  . Hypromellose (ARTIFICIAL TEARS OP) Place 1 drop into both eyes 4 (four) times daily as needed (dry eyes).    Marland Kitchen ipratropium (ATROVENT) 0.06 % nasal spray Place 2 sprays into both nostrils 4 (four) times daily. (Patient taking differently: Place 2 sprays into both nostrils 4 (four) times daily as needed for rhinitis. ) 15 mL 12  . ketoconazole (NIZORAL) 2 % cream SMARTSIG:1 Application Topical 1 to 2 Times Daily    . levocetirizine (XYZAL) 5 MG tablet Take 5 mg by mouth daily as needed for allergies.    . nitroGLYCERIN (NITROSTAT) 0.4 MG SL tablet Place 1 tablet (0.4 mg total) under the tongue every 5 (five) minutes as needed for chest pain. 25 tablet 3  . rosuvastatin (CRESTOR) 10 MG tablet TAKE 1 TABLET (10 MG TOTAL) BY MOUTH DAILY. NEEDS APPT FOR FUTURE REFILLS (Patient taking differently: Take 10 mg by mouth at bedtime. ) 90 tablet 2  . sacubitril-valsartan (ENTRESTO) 97-103 MG Take 1 tablet by mouth 2 (two) times daily. 180 tablet 2  . sodium chloride (OCEAN) 0.65 % SOLN nasal spray Place 1-2 sprays into both nostrils as  needed for congestion.    Marland Kitchen spironolactone (ALDACTONE) 25 MG tablet Take 1 tablet (25 mg total) by mouth daily. Please call for office visit [phone] (Patient taking differently: Take 25 mg by mouth daily. ) 90 tablet 1  . traMADol (ULTRAM) 50 MG tablet Take 1 tablet (50 mg total) by mouth every 6 (six) hours as needed. (Patient taking differently: Take 25-50 mg by mouth every 6 (six) hours as needed (for pain or headaches). ) 75 tablet 1  . triamcinolone cream (KENALOG) 0.1 % Apply 1 application topically daily as needed (to affected areas, for itching).      No facility-administered medications prior to  visit.    ROS Review of Systems  Constitutional: Positive for fatigue. Negative for appetite change, chills, diaphoresis and unexpected weight change.  HENT: Negative.  Negative for trouble swallowing.   Respiratory: Positive for shortness of breath. Negative for cough, chest tightness and wheezing.   Cardiovascular: Negative for chest pain, palpitations and leg swelling.  Gastrointestinal: Negative for abdominal pain, blood in stool, constipation, diarrhea, nausea and vomiting.  Endocrine: Negative.   Genitourinary: Negative.  Negative for difficulty urinating.  Musculoskeletal: Negative.  Negative for arthralgias and myalgias.  Skin: Positive for pallor. Negative for color change and rash.  Neurological: Negative.  Negative for dizziness, weakness, light-headedness and headaches.  Hematological: Negative for adenopathy. Does not bruise/bleed easily.  Psychiatric/Behavioral: Negative.     Objective:  BP (!) 146/60 (BP Location: Left Arm, Patient Position: Sitting, Cuff Size: Normal)   Pulse 60   Temp 97.8 F (36.6 C) (Oral)   Resp 16   Ht 5\' 9"  (1.753 m)   Wt 175 lb 6 oz (79.5 kg)   SpO2 96%   BMI 25.90 kg/m   BP Readings from Last 3 Encounters:  01/06/20 (!) 146/60  12/31/19 (!) 106/53  12/24/19 (!) 158/84    Wt Readings from Last 3 Encounters:  01/06/20 175 lb 6 oz (79.5 kg)  12/31/19 177 lb (80.3 kg)  12/24/19 177 lb (80.3 kg)    Physical Exam Vitals reviewed.  Constitutional:      General: He is not in acute distress.    Appearance: Normal appearance. He is not toxic-appearing or diaphoretic.  HENT:     Nose: Nose normal.     Mouth/Throat:     Mouth: Mucous membranes are moist.  Eyes:     General: No scleral icterus.    Conjunctiva/sclera: Conjunctivae normal.  Cardiovascular:     Rate and Rhythm: Normal rate and regular rhythm.     Heart sounds: No murmur heard.   Pulmonary:     Effort: Pulmonary effort is normal.     Breath sounds: No stridor. No  wheezing, rhonchi or rales.  Abdominal:     General: Abdomen is flat.     Palpations: There is no mass.     Tenderness: There is no abdominal tenderness. There is no guarding.  Musculoskeletal:        General: Normal range of motion.     Cervical back: Neck supple.     Right lower leg: No edema.     Left lower leg: No edema.  Lymphadenopathy:     Cervical: No cervical adenopathy.  Skin:    General: Skin is warm and dry.     Coloration: Skin is pale.  Neurological:     General: No focal deficit present.     Mental Status: He is alert.     Lab Results  Component Value  Date   WBC 4.3 01/06/2020   HGB 11.3 (L) 01/06/2020   HCT 36.0 (L) 01/06/2020   PLT 220 01/06/2020   GLUCOSE 103 (H) 12/31/2019   CHOL 113 07/15/2019   TRIG 156.0 (H) 07/15/2019   HDL 46.70 07/15/2019   LDLCALC 35 07/15/2019   ALT 19 12/31/2019   AST 32 12/31/2019   NA 139 12/31/2019   K 4.5 12/31/2019   CL 102 12/31/2019   CREATININE 1.21 12/31/2019   BUN 22 12/31/2019   CO2 24 12/31/2019   TSH 2.21 01/12/2019   PSA 0.00 Repeated and verified X2. (L) 07/15/2019   INR 1.1 12/18/2017   HGBA1C 6.5 (H) 01/06/2020   MICROALBUR 5.7 01/06/2020    DG Chest 1 View  Result Date: 12/31/2019 CLINICAL DATA:  Weakness and abdominal pain. EXAM: CHEST  1 VIEW COMPARISON:  August 14, 2017 FINDINGS: There is no evidence of acute infiltrate, pleural effusion or pneumothorax. The heart size and mediastinal contours are within normal limits. There is mild calcification of the aortic arch. Degenerative changes seen throughout the thoracic spine. IMPRESSION: No active disease. Electronically Signed   By: Virgina Norfolk M.D.   On: 12/31/2019 16:01   CT ABDOMEN PELVIS W CONTRAST  Result Date: 12/31/2019 CLINICAL DATA:  Left lower quadrant pain. EXAM: CT ABDOMEN AND PELVIS WITH CONTRAST TECHNIQUE: Multidetector CT imaging of the abdomen and pelvis was performed using the standard protocol following bolus administration of  intravenous contrast. CONTRAST:  165mL OMNIPAQUE IOHEXOL 300 MG/ML  SOLN COMPARISON:  09/10/2017 FINDINGS: Lower chest: The lung bases are clear. The heart size is normal. Hepatobiliary: The liver is normal. Cholelithiasis without acute inflammation.There is no biliary ductal dilation. Pancreas: Normal contours without ductal dilatation. No peripancreatic fluid collection. Spleen: Unremarkable. Adrenals/Urinary Tract: --Adrenal glands: Unremarkable. --Right kidney/ureter: Multiple cystic appearing lesions are noted throughout the right kidney, many of which are too small to characterize. The majority are likely simple cysts. There is no right-sided hydronephrosis. --Left kidney/ureter: Multiple left-sided cysts are noted. There is a nonobstructing stone in the lower pole measuring approximately 1 cm. --Urinary bladder: Unremarkable. Stomach/Bowel: --Stomach/Duodenum: No hiatal hernia or other gastric abnormality. Normal duodenal course and caliber. --Small bowel: Unremarkable. --Colon: There is scattered colonic diverticula without CT evidence for diverticulitis. --Appendix: Not visualized. No right lower quadrant inflammation or free fluid. Vascular/Lymphatic: Atherosclerotic calcification is present within the non-aneurysmal abdominal aorta, without hemodynamically significant stenosis. --No retroperitoneal lymphadenopathy. --No mesenteric lymphadenopathy. --No pelvic or inguinal lymphadenopathy. Reproductive: Unremarkable Other: No ascites or free air. There are bilateral fat containing inguinal hernias. Musculoskeletal. No acute displaced fractures. IMPRESSION: 1. No acute abdominopelvic abnormality. 2. Cholelithiasis without acute inflammation. 3. Bilateral fat containing inguinal hernias. Aortic Atherosclerosis (ICD10-I70.0). Electronically Signed   By: Constance Holster M.D.   On: 12/31/2019 18:04    Assessment & Plan:   Edwen was seen today for anemia.  Diagnoses and all orders for this  visit:  Anemia, iron deficiency, inadequate dietary intake- His anemia has worsened.  I do not think he is absorbing the oral iron.  I recommend that he receive a series of iron infusions. -     CBC with Differential/Platelet; Future -     CBC with Differential/Platelet  Diabetes mellitus without complication ([place])- His J6G is at 6.5%.  His blood sugars are adequately well controlled. -     Hemoglobin A1c; Future -     Microalbumin / creatinine urine ratio; Future -     Hemoglobin A1c -  Microalbumin / creatinine urine ratio  Essential hypertension- His blood pressure is adequately well controlled. -     Urinalysis, Routine w reflex microscopic; Future -     Urinalysis, Routine w reflex microscopic  Other orders -     MICROSCOPIC MESSAGE   I am having Princess Bruins. Dann "Bill" maintain his cholecalciferol, Hypromellose (ARTIFICIAL TEARS OP), nitroGLYCERIN, ipratropium, triamcinolone cream, fexofenadine, Eliquis, spironolactone, sacubitril-valsartan, fluticasone, clobetasol, ketoconazole, rosuvastatin, dofetilide, traMADol, ferrous sulfate, levocetirizine, acetaminophen, sodium chloride, and Vitamin D-3.  No orders of the defined types were placed in this encounter.    Follow-up: Return in about 3 months (around 04/07/2020).  [name], MD

## 2020-01-06 NOTE — Telephone Encounter (Signed)
New Message:    Pt said he would like tyo drop off some form that need to be filled out, so he can get help with his Eliquis.

## 2020-01-06 NOTE — Telephone Encounter (Signed)
Patient stopped by office with paperwork. He has submitted his portion of application to [employer]. He just needs MD portion signed/faxed. He would like to pick up the original paperwork once completed/submitted. Placed in MD mail box

## 2020-01-06 NOTE — Patient Instructions (Addendum)
Anemia  Anemia is a condition in which you do not have enough red blood cells or hemoglobin. Hemoglobin is a substance in red blood cells that carries oxygen. When you do not have enough red blood cells or hemoglobin (are anemic), your body cannot get enough oxygen and your organs may not work properly. As a result, you may feel very tired or have other problems. What are the causes? Common causes of anemia include:  Excessive bleeding. Anemia can be caused by excessive bleeding inside or outside the body, including bleeding from the intestine or from periods in women.  Poor nutrition.  Long-lasting (chronic) kidney, thyroid, and liver disease.  Bone marrow disorders.  Cancer and treatments for cancer.  HIV (human immunodeficiency virus) and AIDS (acquired immunodeficiency syndrome).  Treatments for HIV and AIDS.  Spleen problems.  Blood disorders.  Infections, medicines, and autoimmune disorders that destroy red blood cells. What are the signs or symptoms? Symptoms of this condition include:  Minor weakness.  Dizziness.  Headache.  Feeling heartbeats that are irregular or faster than normal (palpitations).  Shortness of breath, especially with exercise.  Paleness.  Cold sensitivity.  Indigestion.  Nausea.  Difficulty sleeping.  Difficulty concentrating. Symptoms may occur suddenly or develop slowly. If your anemia is mild, you may not have symptoms. How is this diagnosed? This condition is diagnosed based on:  Blood tests.  Your medical history.  A physical exam.  Bone marrow biopsy. Your health care provider may also check your stool (feces) for blood and may do additional testing to look for the cause of your bleeding. You may also have other tests, including:  Imaging tests, such as a CT scan or MRI.  Endoscopy.  Colonoscopy. How is this treated? Treatment for this condition depends on the cause. If you continue to lose a lot of blood, you may  need to be treated at a hospital. Treatment may include:  Taking supplements of iron, vitamin S31, or folic acid.  Taking a hormone medicine (erythropoietin) that can help to stimulate red blood cell growth.  Having a blood transfusion. This may be needed if you lose a lot of blood.  Making changes to your diet.  Having surgery to remove your spleen. Follow these instructions at home:  Take over-the-counter and prescription medicines only as told by your health care provider.  Take supplements only as told by your health care provider.  Follow any diet instructions that you were given.  Keep all follow-up visits as told by your health care provider. This is important. Contact a health care provider if:  You develop new bleeding anywhere in the body. Get help right away if:  You are very weak.  You are short of breath.  You have pain in your abdomen or chest.  You are dizzy or feel faint.  You have trouble concentrating.  You have bloody or black, tarry stools.  You vomit repeatedly or you vomit up blood. Summary  Anemia is a condition in which you do not have enough red blood cells or enough of a substance in your red blood cells that carries oxygen (hemoglobin).  Symptoms may occur suddenly or develop slowly.  If your anemia is mild, you may not have symptoms.  This condition is diagnosed with blood tests as well as a medical history and physical exam. Other tests may be needed.  Treatment for this condition depends on the cause of the anemia. This information is not intended to replace advice given to you by  your health care provider. Make sure you discuss any questions you have with your health care provider. Document Revised: 04/26/2017 Document Reviewed: 06/15/2016 Elsevier Patient Education  2020 [employer]  

## 2020-01-07 LAB — MICROALBUMIN / CREATININE URINE RATIO
Creatinine, Urine: 104 mg/dL (ref 20–320)
Microalb Creat Ratio: 55 mcg/mg creat — ABNORMAL HIGH (ref <30)
Microalb, Ur: 5.7 mg/dL

## 2020-01-07 LAB — CBC WITH DIFFERENTIAL/PLATELET
Absolute Monocytes: 645 cells/uL (ref 200–950)
Basophils Absolute: 22 cells/uL (ref 0–200)
Basophils Relative: 0.5 %
Eosinophils Absolute: 52 cells/uL (ref 15–500)
Eosinophils Relative: 1.2 %
HCT: 36 % — ABNORMAL LOW (ref 38.5–50.0)
Hemoglobin: 11.3 g/dL — ABNORMAL LOW (ref 13.2–17.1)
Lymphs Abs: 869 cells/uL (ref 850–3900)
MCH: 24.8 pg — ABNORMAL LOW (ref 27.0–33.0)
MCHC: 31.4 g/dL — ABNORMAL LOW (ref 32.0–36.0)
MCV: 78.9 fL — ABNORMAL LOW (ref 80.0–100.0)
MPV: 10.4 fL (ref 7.5–12.5)
Monocytes Relative: 15 %
Neutro Abs: 2713 cells/uL (ref 1500–7800)
Neutrophils Relative %: 63.1 %
Platelets: 220 10*3/uL (ref 140–400)
RBC: 4.56 10*6/uL (ref 4.20–5.80)
RDW: 14.8 % (ref 11.0–15.0)
Total Lymphocyte: 20.2 %
WBC: 4.3 10*3/uL (ref 3.8–10.8)

## 2020-01-07 LAB — URINALYSIS, ROUTINE W REFLEX MICROSCOPIC
Bacteria, UA: NONE SEEN /HPF
Bilirubin Urine: NEGATIVE
Glucose, UA: NEGATIVE
Hyaline Cast: NONE SEEN /LPF
Ketones, ur: NEGATIVE
Leukocytes,Ua: NEGATIVE
Nitrite: NEGATIVE
Specific Gravity, Urine: 1.014 (ref 1.001–1.03)
Squamous Epithelial / HPF: NONE SEEN /HPF (ref <=5)
WBC, UA: NONE SEEN /HPF (ref 0–5)
pH: 5.5 (ref 5.0–8.0)

## 2020-01-07 LAB — HEMOGLOBIN A1C
Hgb A1c MFr Bld: 6.5 % of total Hgb — ABNORMAL HIGH (ref <5.7)
Mean Plasma Glucose: 140 (calc)
eAG (mmol/L): 7.7 (calc)

## 2020-01-07 NOTE — Telephone Encounter (Signed)
Spoke to patient-he states the financial paperwork (out of pocket expense and tax paper) needs to be faxed to [employer] as well.  Financial paperwork faxed to [employer] Patient assistance and placed back with Eliquis paperwork to be signed and completed next week when MD back in office.  Patient verbalized understanding

## 2020-01-07 NOTE — Telephone Encounter (Signed)
Patient calling to follow up on the paperwork he dropped off yesterday.

## 2020-01-08 NOTE — Telephone Encounter (Signed)
Called to schedule and the scheduler was in a meeting.

## 2020-01-11 DIAGNOSIS — L603 Nail dystrophy: Secondary | ICD-10-CM | POA: Diagnosis not present

## 2020-01-11 DIAGNOSIS — I739 Peripheral vascular disease, unspecified: Secondary | ICD-10-CM | POA: Diagnosis not present

## 2020-01-12 ENCOUNTER — Ambulatory Visit
Admission: RE | Admit: 2020-01-12 | Discharge: 2020-01-12 | Disposition: A | Discharge status: Home / Self Care | Payer: HMO | Source: Ambulatory Visit | Attending: Internal Medicine | Admitting: Internal Medicine

## 2020-01-12 ENCOUNTER — Other Ambulatory Visit: Payer: Self-pay | Admitting: Internal Medicine

## 2020-01-12 DIAGNOSIS — I6782 Cerebral ischemia: Secondary | ICD-10-CM | POA: Diagnosis not present

## 2020-01-12 DIAGNOSIS — H748X1 Other specified disorders of right middle ear and mastoid: Secondary | ICD-10-CM | POA: Diagnosis not present

## 2020-01-12 DIAGNOSIS — G8929 Other chronic pain: Secondary | ICD-10-CM

## 2020-01-12 DIAGNOSIS — R519 Headache, unspecified: Secondary | ICD-10-CM

## 2020-01-12 DIAGNOSIS — J0100 Acute maxillary sinusitis, unspecified: Secondary | ICD-10-CM | POA: Diagnosis not present

## 2020-01-12 DIAGNOSIS — J320 Chronic maxillary sinusitis: Secondary | ICD-10-CM | POA: Diagnosis not present

## 2020-01-12 MED ORDER — GADOBENATE DIMEGLUMINE 529 MG/ML IV SOLN: 15.0000 mL | Freq: Once | INTRAVENOUS | Status: DC | PRN

## 2020-01-13 ENCOUNTER — Telehealth: Payer: Self-pay

## 2020-01-13 NOTE — Telephone Encounter (Signed)
Received paperwork for patient assistance- bristol myers for Eliquis, filled out prescriber portion and had Dr.Berry sing- faxed over today 01/13/2020 @ 3:50 PM. Patient was called and notified as they wanted to pick up completed portion for their records.  Placed up front, and patient notified to come pick up forms from office. Patient verbalized understanding.

## 2020-01-13 NOTE — Telephone Encounter (Signed)
[facility] contacted and we have scheduled pt for:   01/20/20 at 10am.   Pt contacted and informed of same. Pt stated understanding.

## 2020-01-18 ENCOUNTER — Telehealth: Payer: Self-pay | Admitting: Internal Medicine

## 2020-01-18 DIAGNOSIS — D508 Other iron deficiency anemias: Secondary | ICD-10-CM

## 2020-01-18 NOTE — Telephone Encounter (Signed)
Pt wanted to know if he could retest his iron prior to the infusion. Please advise if the lab can be ordered.

## 2020-01-18 NOTE — Telephone Encounter (Signed)
New Message:   Pt is calling again and would like a call back as soon as possible to discuss his appt for Wednesday. Please advise.

## 2020-01-18 NOTE — Telephone Encounter (Signed)
New Message:   Pt is calling and states he would like a call back to discuss his appt on Wednedsday. Please advise.

## 2020-01-19 ENCOUNTER — Encounter: Payer: Self-pay | Admitting: Internal Medicine

## 2020-01-19 ENCOUNTER — Other Ambulatory Visit (INDEPENDENT_AMBULATORY_CARE_PROVIDER_SITE_OTHER): Payer: HMO

## 2020-01-19 DIAGNOSIS — D508 Other iron deficiency anemias: Secondary | ICD-10-CM | POA: Diagnosis not present

## 2020-01-19 LAB — CBC WITH DIFFERENTIAL/PLATELET
Basophils Absolute: 0 10*3/uL (ref 0.0–0.1)
Basophils Relative: 0.9 % (ref 0.0–3.0)
Eosinophils Absolute: 0 10*3/uL (ref 0.0–0.7)
Eosinophils Relative: 0.6 % (ref 0.0–5.0)
HCT: 37.8 % — ABNORMAL LOW (ref 39.0–52.0)
Hemoglobin: 12.1 g/dL — ABNORMAL LOW (ref 13.0–17.0)
Lymphocytes Relative: 18.8 % (ref 12.0–46.0)
Lymphs Abs: 0.8 10*3/uL (ref 0.7–4.0)
MCHC: 32 g/dL (ref 30.0–36.0)
MCV: 79.5 fl (ref 78.0–100.0)
Monocytes Absolute: 0.3 10*3/uL (ref 0.1–1.0)
Monocytes Relative: 7.4 % (ref 3.0–12.0)
Neutro Abs: 3.2 10*3/uL (ref 1.4–7.7)
Neutrophils Relative %: 72.3 % (ref 43.0–77.0)
Platelets: 224 10*3/uL (ref 150.0–400.0)
RBC: 4.75 Mil/uL (ref 4.22–5.81)
RDW: 17.6 % — ABNORMAL HIGH (ref 11.5–15.5)
WBC: 4.4 10*3/uL (ref 4.0–10.5)

## 2020-01-19 LAB — FERRITIN: Ferritin: 30.7 ng/mL (ref 22.0–322.0)

## 2020-01-19 LAB — IRON: Iron: 99 ug/dL (ref 42–165)

## 2020-01-19 NOTE — Addendum Note (Signed)
Addended by: Trenda Moots on: 07/16/4710 08:56 AM   Modules accepted: Orders

## 2020-01-19 NOTE — Telephone Encounter (Signed)
Spoke to Dr. Ronnald Ramp and he agreed to the lab recheck.   Labs have been entered and pt has been informed. Pt will go to the lab today so we can have the results prior to infusion appointment tomorrow morning.

## 2020-01-20 ENCOUNTER — Encounter (HOSPITAL_COMMUNITY): Payer: HMO

## 2020-01-20 ENCOUNTER — Encounter: Payer: Self-pay | Admitting: Internal Medicine

## 2020-01-21 ENCOUNTER — Encounter: Payer: Self-pay | Admitting: Internal Medicine

## 2020-01-21 ENCOUNTER — Other Ambulatory Visit: Payer: HMO

## 2020-01-22 NOTE — Telephone Encounter (Signed)
Eliquis assistance has been approved through 05/27/20

## 2020-01-25 ENCOUNTER — Other Ambulatory Visit: Payer: Self-pay | Admitting: Internal Medicine

## 2020-01-25 ENCOUNTER — Telehealth: Payer: Self-pay | Admitting: Internal Medicine

## 2020-01-25 DIAGNOSIS — J301 Allergic rhinitis due to pollen: Secondary | ICD-10-CM

## 2020-01-25 MED ORDER — IPRATROPIUM BROMIDE 0.06 % NA SOLN: 2.0000 | Freq: Four times a day (QID) | NASAL | 1 refills | Status: DC | PRN

## 2020-01-25 MED ORDER — FLUTICASONE PROPIONATE 50 MCG/ACT NA SUSP: 2.0000 | Freq: Every day | NASAL | 1 refills | Status: DC | PRN

## 2020-01-25 NOTE — Telephone Encounter (Signed)
    1.Medication Requested: fluticasone (FLONASE) 50 MCG/ACT nasal spray  ipratropium (ATROVENT) 0.06 % nasal spray  2. Pharmacy (Name, Street, [place]): [id number] - [place], [place]  3. On Med List: yes  4. Last Visit with PCP:  12/24/19  5. Next visit date with PCP: n/a   Agent: Please be advised that RX refills may take up to 3 business days. We ask that you follow-up with your pharmacy.

## 2020-01-26 ENCOUNTER — Other Ambulatory Visit (HOSPITAL_COMMUNITY): Payer: Self-pay | Admitting: Cardiology

## 2020-01-26 ENCOUNTER — Other Ambulatory Visit: Payer: Self-pay | Admitting: Cardiovascular Disease

## 2020-02-04 ENCOUNTER — Ambulatory Visit (INDEPENDENT_AMBULATORY_CARE_PROVIDER_SITE_OTHER): Payer: HMO | Admitting: Internal Medicine

## 2020-02-04 ENCOUNTER — Other Ambulatory Visit: Payer: Self-pay

## 2020-02-04 ENCOUNTER — Encounter: Payer: Self-pay | Admitting: Internal Medicine

## 2020-02-04 VITALS — BP 152/60 | HR 66 | Temp 98.3°F | Ht 69.0 in | Wt 176.0 lb

## 2020-02-04 DIAGNOSIS — R310 Gross hematuria: Secondary | ICD-10-CM | POA: Diagnosis not present

## 2020-02-04 DIAGNOSIS — I4819 Other persistent atrial fibrillation: Secondary | ICD-10-CM | POA: Diagnosis not present

## 2020-02-04 DIAGNOSIS — C61 Malignant neoplasm of prostate: Secondary | ICD-10-CM

## 2020-02-04 DIAGNOSIS — D508 Other iron deficiency anemias: Secondary | ICD-10-CM

## 2020-02-04 NOTE — Assessment & Plan Note (Signed)
On Eliquis. Not a candidate for a lower dose

## 2020-02-04 NOTE — Assessment & Plan Note (Signed)
S/p prostate cancer treatment (XRT and seeds) 15 years ago - Dr [name]

## 2020-02-04 NOTE — Assessment & Plan Note (Signed)
Worse C/o blood in urine off and on after prostate cancer treatment (XRT and seeds) 15 y ago - Dr [name]. It is much worse over past 5 d. No fever, no pain. Just had abd/pelvis CT in 8/21. He had a cystoscopy(/) 5 y ago.  UA, Cx Urol f/u Other labs

## 2020-02-04 NOTE — Assessment & Plan Note (Signed)
On Eliquis Probably worse due to hematuria Labs On iron

## 2020-02-04 NOTE — Progress Notes (Signed)
Subjective:  Patient ID: Dillon Young, male    DOB: 02-16-1933  Age: 86 y.o. MRN: [id number]  CC: No chief complaint on file.   HPI Dillon Young presents for blood in urine off and on after prostate cancer treatment (XRT and seeds) 15 y ago - Dr [name]. It is much worse over past 5 d. No fever, no pain. Just had abd/pelvis CT in 8/21. He had a cystoscopy(/) 5 y ago.  Outpatient Medications Prior to Visit  Medication Sig Dispense Refill  . acetaminophen (TYLENOL) 500 MG tablet Take 500-1,000 mg by mouth every 6 (six) hours as needed (for headaches).    . Cholecalciferol (VITAMIN D-3) 25 MCG (1000 UT) CAPS Take 1,000 Units by mouth daily.    . clobetasol (TEMOVATE) 0.05 % external solution Apply 1 application topically See admin instructions. Apply to affected areas of the back twice weekly    . dofetilide (TIKOSYN) 125 MCG capsule TAKE 1 CAPSULE BY MOUTH TWICE DAILY (Patient taking differently: Take 125 mcg by mouth 2 (two) times daily. ) 180 capsule 3  . ELIQUIS 5 MG TABS tablet TAKE 1 TABLET BY MOUTH TWICE A DAY 180 tablet 1  . ferrous sulfate 325 (65 FE) MG tablet Take 1 tablet (325 mg total) by mouth 2 (two) times daily with a meal. 180 tablet 1  . fexofenadine (ALLEGRA) 180 MG tablet Take 180 mg by mouth daily as needed for allergies or rhinitis.     . fluticasone (FLONASE) 50 MCG/ACT nasal spray Place 2 sprays into both nostrils daily as needed for allergies or rhinitis. 48 g 1  . Hypromellose (ARTIFICIAL TEARS OP) Place 1 drop into both eyes 4 (four) times daily as needed (dry eyes).    Marland Kitchen ipratropium (ATROVENT) 0.06 % nasal spray Place 2 sprays into both nostrils 4 (four) times daily as needed for rhinitis. 45 mL 1  . ketoconazole (NIZORAL) 2 % cream SMARTSIG:1 Application Topical 1 to 2 Times Daily    . levocetirizine (XYZAL) 5 MG tablet Take 5 mg by mouth daily as needed for allergies.    . nitroGLYCERIN (NITROSTAT) 0.4 MG SL tablet Place 1 tablet (0.4 mg total) under the  tongue every 5 (five) minutes as needed for chest pain. 25 tablet 3  . rosuvastatin (CRESTOR) 10 MG tablet TAKE 1 TABLET (10 MG TOTAL) BY MOUTH DAILY. NEEDS APPT FOR FUTURE REFILLS (Patient taking differently: Take 10 mg by mouth at bedtime. ) 90 tablet 2  . sacubitril-valsartan (ENTRESTO) 97-103 MG Take 1 tablet by mouth 2 (two) times daily. 180 tablet 2  . sodium chloride (OCEAN) 0.65 % SOLN nasal spray Place 1-2 sprays into both nostrils as needed for congestion.    Marland Kitchen spironolactone (ALDACTONE) 25 MG tablet Take 1 tablet (25 mg total) by mouth daily. 90 tablet 3  . traMADol (ULTRAM) 50 MG tablet Take 1 tablet (50 mg total) by mouth every 6 (six) hours as needed. (Patient taking differently: Take 25-50 mg by mouth every 6 (six) hours as needed (for pain or headaches). ) 75 tablet 1  . triamcinolone cream (KENALOG) 0.1 % Apply 1 application topically daily as needed (to affected areas, for itching).     . cholecalciferol (VITAMIN D) 1000 UNITS tablet Take 1,000 Units by mouth at bedtime.       No facility-administered medications prior to visit.    ROS: Review of Systems  Constitutional: Negative for appetite change, chills, fatigue, fever and unexpected weight change.  HENT: Negative for  congestion, nosebleeds, sneezing, sore throat and trouble swallowing.   Eyes: Negative for itching and visual disturbance.  Respiratory: Negative for cough.   Cardiovascular: Negative for chest pain, palpitations and leg swelling.  Gastrointestinal: Negative for abdominal distention, blood in stool, diarrhea and nausea.  Genitourinary: Positive for hematuria. Negative for decreased urine volume, difficulty urinating, dysuria, flank pain, frequency, scrotal swelling, testicular pain and urgency.  Musculoskeletal: Negative for back pain, gait problem, joint swelling and neck pain.  Neurological: Positive for light-headedness. Negative for dizziness, tremors, speech difficulty and weakness.  Hematological:  Bruises/bleeds easily.  Psychiatric/Behavioral: Negative for agitation, dysphoric mood and sleep disturbance. The patient is not nervous/anxious.     Objective:  BP (!) 152/60 (BP Location: Left Arm, Patient Position: Sitting, Cuff Size: Large)   Pulse 66   Temp 98.3 F (36.8 C) (Oral)   Ht 5\' 9"  (1.753 m)   Wt 176 lb (79.8 kg)   SpO2 97%   BMI 25.99 kg/m   BP Readings from Last 3 Encounters:  02/04/20 (!) 152/60  01/06/20 (!) 146/60  12/31/19 (!) 106/53    Wt Readings from Last 3 Encounters:  02/04/20 176 lb (79.8 kg)  01/06/20 175 lb 6 oz (79.5 kg)  12/31/19 177 lb (80.3 kg)    Physical Exam Constitutional:      General: He is not in acute distress.    Appearance: Normal appearance. He is well-developed. He is not toxic-appearing or diaphoretic.     Comments: NAD  Eyes:     Conjunctiva/sclera: Conjunctivae normal.     Pupils: Pupils are equal, round, and reactive to light.  Neck:     Thyroid: No thyromegaly.     Vascular: No JVD.  Cardiovascular:     Rate and Rhythm: Normal rate. Rhythm irregular.     Heart sounds: Normal heart sounds. No murmur heard.  No friction rub. No gallop.   Pulmonary:     Effort: Pulmonary effort is normal. No respiratory distress.     Breath sounds: Normal breath sounds. No wheezing or rales.  Chest:     Chest wall: No tenderness.  Abdominal:     General: Bowel sounds are normal. There is no distension.     Palpations: Abdomen is soft. There is no mass.     Tenderness: There is no abdominal tenderness. There is no guarding or rebound.  Musculoskeletal:        General: No tenderness. Normal range of motion.     Cervical back: Normal range of motion.  Lymphadenopathy:     Cervical: No cervical adenopathy.  Skin:    General: Skin is warm and dry.     Findings: No bruising or rash.  Neurological:     Mental Status: He is alert and oriented to person, place, and time.     Cranial Nerves: No cranial nerve deficit.     Motor: No  weakness or abnormal muscle tone.     Coordination: Coordination normal.     Gait: Gait normal.     Deep Tendon Reflexes: Reflexes are normal and symmetric.  Psychiatric:        Behavior: Behavior normal.        Thought Content: Thought content normal.        Judgment: Judgment normal.     Time>45 min: time was spent on coordination of care, reviewing past records, lab results and X rays; educating the patient.   Lab Results  Component Value Date   WBC 4.4 01/19/2020  HGB 12.1 (L) 01/19/2020   HCT 37.8 (L) 01/19/2020   PLT 224.0 01/19/2020   GLUCOSE 103 (H) 12/31/2019   CHOL 113 07/15/2019   TRIG 156.0 (H) 07/15/2019   HDL 46.70 07/15/2019   LDLCALC 35 07/15/2019   ALT 19 12/31/2019   AST 32 12/31/2019   NA 139 12/31/2019   K 4.5 12/31/2019   CL 102 12/31/2019   CREATININE 1.21 12/31/2019   BUN 22 12/31/2019   CO2 24 12/31/2019   TSH 2.21 01/12/2019   PSA 0.00 Repeated and verified X2. (L) 07/15/2019   INR 1.1 12/18/2017   HGBA1C 6.5 (H) 01/06/2020   MICROALBUR 5.7 01/06/2020    MR BRAIN WO CONTRAST  Result Date: 01/12/2020 CLINICAL DATA:  Headache EXAM: MRI HEAD WITHOUT CONTRAST TECHNIQUE: Multiplanar, multiecho pulse sequences of the brain and surrounding structures were obtained without intravenous contrast. COMPARISON:  02/06/2018 head CT.  10/12/2004 MRI/MRA head. FINDINGS: Brain: Mild frontoparietal volume loss with ex vacuo dilatation. Sequela of remote left caudate body and left thalamic insults. Mild chronic microvascular ischemic changes. No acute infarct. Remote right occipital microhemorrhage. No midline shift, ventriculomegaly or extra-axial fluid collection. No mass lesion. Vascular: Major intracranial flow voids are grossly preserved. Skull and upper cervical spine: Normal marrow signal. Sinuses/Orbits: Normal orbits. Sequela of bilateral lens replacement. Mild ethmoid and right maxillary sinus mucosal thickening. Trace right mastoid effusion. Other: None.  IMPRESSION: No acute intracranial process. Sequela of remote left caudate body and left thalamic insults. Remote right occipital microhemorrhage. Mild frontoparietal atrophy, progressed since prior exam. Mild chronic microvascular ischemic changes. Electronically Signed   By: Primitivo Gauze M.D.   On: 01/12/2020 11:33    Assessment & Plan:    Walker Kehr, MD

## 2020-02-05 ENCOUNTER — Other Ambulatory Visit: Payer: HMO

## 2020-02-05 DIAGNOSIS — D508 Other iron deficiency anemias: Secondary | ICD-10-CM | POA: Diagnosis not present

## 2020-02-05 DIAGNOSIS — R351 Nocturia: Secondary | ICD-10-CM | POA: Diagnosis not present

## 2020-02-05 DIAGNOSIS — R310 Gross hematuria: Secondary | ICD-10-CM

## 2020-02-05 DIAGNOSIS — N281 Cyst of kidney, acquired: Secondary | ICD-10-CM | POA: Diagnosis not present

## 2020-02-05 DIAGNOSIS — N200 Calculus of kidney: Secondary | ICD-10-CM | POA: Diagnosis not present

## 2020-02-06 LAB — COMPLETE METABOLIC PANEL WITH GFR
AG Ratio: 1.7 (calc) (ref 1.0–2.5)
ALT: 6 U/L — ABNORMAL LOW (ref 9–46)
AST: 12 U/L (ref 10–35)
Albumin: 4.2 g/dL (ref 3.6–5.1)
Alkaline phosphatase (APISO): 49 U/L (ref 35–144)
BUN/Creatinine Ratio: 12 (calc) (ref 6–22)
BUN: 14 mg/dL (ref 7–25)
CO2: 28 mmol/L (ref 20–32)
Calcium: 9 mg/dL (ref 8.6–10.3)
Chloride: 104 mmol/L (ref 98–110)
Creat: 1.14 mg/dL — ABNORMAL HIGH (ref 0.70–1.11)
GFR, Est African American: 68 mL/min/{1.73_m2} (ref >=60)
GFR, Est Non African American: 58 mL/min/{1.73_m2} — ABNORMAL LOW (ref >=60)
Globulin: 2.5 g/dL (calc) (ref 1.9–3.7)
Glucose, Bld: 116 mg/dL — ABNORMAL HIGH (ref 65–99)
Potassium: 4.4 mmol/L (ref 3.5–5.3)
Sodium: 141 mmol/L (ref 135–146)
Total Bilirubin: 0.4 mg/dL (ref 0.2–1.2)
Total Protein: 6.7 g/dL (ref 6.1–8.1)

## 2020-02-07 LAB — CBC WITH DIFFERENTIAL/PLATELET
Absolute Monocytes: 594 cells/uL (ref 200–950)
Basophils Absolute: 32 cells/uL (ref 0–200)
Basophils Relative: 0.6 %
Eosinophils Absolute: 80 cells/uL (ref 15–500)
Eosinophils Relative: 1.5 %
HCT: 43.4 % (ref 38.5–50.0)
Hemoglobin: 13.4 g/dL (ref 13.2–17.1)
Lymphs Abs: 1092 cells/uL (ref 850–3900)
MCH: 25.9 pg — ABNORMAL LOW (ref 27.0–33.0)
MCHC: 30.9 g/dL — ABNORMAL LOW (ref 32.0–36.0)
MCV: 83.9 fL (ref 80.0–100.0)
MPV: 10.4 fL (ref 7.5–12.5)
Monocytes Relative: 11.2 %
Neutro Abs: 3503 cells/uL (ref 1500–7800)
Neutrophils Relative %: 66.1 %
Platelets: 147 10*3/uL (ref 140–400)
RBC: 5.17 10*6/uL (ref 4.20–5.80)
RDW: 16.5 % — ABNORMAL HIGH (ref 11.0–15.0)
Total Lymphocyte: 20.6 %
WBC: 5.3 10*3/uL (ref 3.8–10.8)

## 2020-02-07 LAB — URINALYSIS
Bilirubin Urine: NEGATIVE
Glucose, UA: NEGATIVE
Ketones, ur: NEGATIVE
Nitrite: NEGATIVE
Specific Gravity, Urine: 1.01 (ref 1.001–1.03)
pH: 6 (ref 5.0–8.0)

## 2020-02-07 LAB — CULTURE, URINE COMPREHENSIVE: RESULT:: NO GROWTH

## 2020-02-10 ENCOUNTER — Telehealth: Payer: Self-pay | Admitting: *Deleted

## 2020-02-10 NOTE — Telephone Encounter (Signed)
Received notification via fax-[employer] Patient Assistance approved for the remainder of the year.

## 2020-02-11 ENCOUNTER — Encounter: Payer: Self-pay | Admitting: Internal Medicine

## 2020-02-23 ENCOUNTER — Ambulatory Visit (INDEPENDENT_AMBULATORY_CARE_PROVIDER_SITE_OTHER): Payer: HMO | Admitting: Internal Medicine

## 2020-02-23 ENCOUNTER — Encounter: Payer: Self-pay | Admitting: Internal Medicine

## 2020-02-23 ENCOUNTER — Other Ambulatory Visit: Payer: Self-pay

## 2020-02-23 VITALS — BP 126/74 | HR 89 | Temp 98.2°F | Resp 16 | Ht 69.0 in | Wt 177.0 lb

## 2020-02-23 DIAGNOSIS — Z23 Encounter for immunization: Secondary | ICD-10-CM

## 2020-02-23 DIAGNOSIS — R310 Gross hematuria: Secondary | ICD-10-CM

## 2020-02-23 DIAGNOSIS — I10 Essential (primary) hypertension: Secondary | ICD-10-CM | POA: Diagnosis not present

## 2020-02-23 DIAGNOSIS — E119 Type 2 diabetes mellitus without complications: Secondary | ICD-10-CM

## 2020-02-23 DIAGNOSIS — I4819 Other persistent atrial fibrillation: Secondary | ICD-10-CM

## 2020-02-23 MED ORDER — APIXABAN 2.5 MG PO TABS: 2.5000 mg | ORAL_TABLET | Freq: Two times a day (BID) | ORAL | 1 refills | Status: DC

## 2020-02-23 NOTE — Progress Notes (Signed)
Subjective:  Patient ID: Dillon Young, male    DOB: 04-Dec-1932  Age: 86 y.o. MRN: [id number]  CC: Atrial Fibrillation  This visit occurred during the SARS-CoV-2 public health emergency.  Safety protocols were in place, including screening questions prior to the visit, additional usage of staff PPE, and extensive cleaning of exam room while observing appropriate contact time as indicated for disinfecting solutions.    HPI Dillon Young presents for f/up - He recently had another recurrence of painless, gross hematuria.  He has seen urology and apparently scan was unremarkable but he is planning to undergo a cystoscopy to screen for bladder cancer.  He says the hematuria has resolved.  He wonders if lowering his dose of Eliquis would help prevent the episodes of hematuria.  Outpatient Medications Prior to Visit  Medication Sig Dispense Refill  . acetaminophen (TYLENOL) 500 MG tablet Take 500-1,000 mg by mouth every 6 (six) hours as needed (for headaches).    . Cholecalciferol (VITAMIN D-3) 25 MCG (1000 UT) CAPS Take 1,000 Units by mouth daily.    . clobetasol (TEMOVATE) 0.05 % external solution Apply 1 application topically See admin instructions. Apply to affected areas of the back twice weekly    . dofetilide (TIKOSYN) 125 MCG capsule TAKE 1 CAPSULE BY MOUTH TWICE DAILY (Patient taking differently: Take 125 mcg by mouth 2 (two) times daily. ) 180 capsule 3  . fexofenadine (ALLEGRA) 180 MG tablet Take 180 mg by mouth daily as needed for allergies or rhinitis.     . fluticasone (FLONASE) 50 MCG/ACT nasal spray Place 2 sprays into both nostrils daily as needed for allergies or rhinitis. 48 g 1  . Hypromellose (ARTIFICIAL TEARS OP) Place 1 drop into both eyes 4 (four) times daily as needed (dry eyes).    Marland Kitchen ipratropium (ATROVENT) 0.06 % nasal spray Place 2 sprays into both nostrils 4 (four) times daily as needed for rhinitis. 45 mL 1  . ketoconazole (NIZORAL) 2 % cream SMARTSIG:1 Application  Topical 1 to 2 Times Daily    . levocetirizine (XYZAL) 5 MG tablet Take 5 mg by mouth daily as needed for allergies.    . nitroGLYCERIN (NITROSTAT) 0.4 MG SL tablet Place 1 tablet (0.4 mg total) under the tongue every 5 (five) minutes as needed for chest pain. 25 tablet 3  . rosuvastatin (CRESTOR) 10 MG tablet TAKE 1 TABLET (10 MG TOTAL) BY MOUTH DAILY. NEEDS APPT FOR FUTURE REFILLS (Patient taking differently: Take 10 mg by mouth at bedtime. ) 90 tablet 2  . sacubitril-valsartan (ENTRESTO) 97-103 MG Take 1 tablet by mouth 2 (two) times daily. 180 tablet 2  . sodium chloride (OCEAN) 0.65 % SOLN nasal spray Place 1-2 sprays into both nostrils as needed for congestion.    Marland Kitchen spironolactone (ALDACTONE) 25 MG tablet Take 1 tablet (25 mg total) by mouth daily. 90 tablet 3  . traMADol (ULTRAM) 50 MG tablet Take 1 tablet (50 mg total) by mouth every 6 (six) hours as needed. (Patient taking differently: Take 25-50 mg by mouth every 6 (six) hours as needed (for pain or headaches). ) 75 tablet 1  . triamcinolone cream (KENALOG) 0.1 % Apply 1 application topically daily as needed (to affected areas, for itching).     Marland Kitchen ELIQUIS 5 MG TABS tablet TAKE 1 TABLET BY MOUTH TWICE A DAY 180 tablet 1  . ferrous sulfate 325 (65 FE) MG tablet Take 1 tablet (325 mg total) by mouth 2 (two) times daily with a  meal. 180 tablet 1  . cholecalciferol (VITAMIN D) 1000 UNITS tablet Take 1,000 Units by mouth at bedtime.       No facility-administered medications prior to visit.    ROS Review of Systems  Constitutional: Negative.  Negative for appetite change, diaphoresis, fatigue and unexpected weight change.  HENT: Negative.   Eyes: Negative for visual disturbance.  Respiratory: Negative for cough, chest tightness, shortness of breath and wheezing.   Cardiovascular: Negative for chest pain, palpitations and leg swelling.  Gastrointestinal: Negative for abdominal pain, constipation, diarrhea, nausea and vomiting.  Endocrine:  Negative.   Genitourinary: Negative.  Negative for difficulty urinating, frequency, hematuria and urgency.  Musculoskeletal: Negative.  Negative for arthralgias and myalgias.  Skin: Negative.  Negative for color change and pallor.  Neurological: Negative.  Negative for dizziness, weakness and light-headedness.  Hematological: Negative for adenopathy. Does not bruise/bleed easily.  Psychiatric/Behavioral: Negative.     Objective:  BP 126/74   Pulse 89   Temp 98.2 F (36.8 C) (Oral)   Resp 16   Ht 5\' 9"  (1.753 m)   Wt 177 lb (80.3 kg)   SpO2 96%   BMI 26.14 kg/m   BP Readings from Last 3 Encounters:  02/23/20 126/74  02/04/20 (!) 152/60  01/06/20 (!) 146/60    Wt Readings from Last 3 Encounters:  02/23/20 177 lb (80.3 kg)  02/04/20 176 lb (79.8 kg)  01/06/20 175 lb 6 oz (79.5 kg)    Physical Exam Vitals reviewed.  HENT:     Nose: Nose normal.     Mouth/Throat:     Mouth: Mucous membranes are moist.  Eyes:     General: No scleral icterus.    Conjunctiva/sclera: Conjunctivae normal.  Cardiovascular:     Rate and Rhythm: Normal rate. Rhythm irregularly irregular.     Heart sounds: Murmur heard.  Systolic murmur is present with a grade of 1/6.   Pulmonary:     Effort: Pulmonary effort is normal.     Breath sounds: No stridor. No wheezing, rhonchi or rales.  Abdominal:     Palpations: There is no mass.     Tenderness: There is no abdominal tenderness. There is no guarding.  Musculoskeletal:        General: Normal range of motion.     Cervical back: Neck supple.     Right lower leg: No edema.     Left lower leg: No edema.  Lymphadenopathy:     Cervical: No cervical adenopathy.  Skin:    General: Skin is warm and dry.     Coloration: Skin is not pale.  Neurological:     General: No focal deficit present.     Mental Status: He is alert.  Psychiatric:        Mood and Affect: Mood normal.     Lab Results  Component Value Date   WBC 5.3 02/05/2020   HGB  13.4 02/05/2020   HCT 43.4 02/05/2020   PLT 147 02/05/2020   GLUCOSE 116 (H) 02/05/2020   CHOL 113 07/15/2019   TRIG 156.0 (H) 07/15/2019   HDL 46.70 07/15/2019   LDLCALC 35 07/15/2019   ALT 6 (L) 02/05/2020   AST 12 02/05/2020   NA 141 02/05/2020   K 4.4 02/05/2020   CL 104 02/05/2020   CREATININE 1.14 (H) 02/05/2020   BUN 14 02/05/2020   CO2 28 02/05/2020   TSH 2.21 01/12/2019   PSA 0.00 Repeated and verified X2. (L) 07/15/2019   INR 1.1  12/18/2017   HGBA1C 6.5 (H) 01/06/2020   MICROALBUR 5.7 01/06/2020    MR BRAIN WO CONTRAST  Result Date: 01/12/2020 CLINICAL DATA:  Headache EXAM: MRI HEAD WITHOUT CONTRAST TECHNIQUE: Multiplanar, multiecho pulse sequences of the brain and surrounding structures were obtained without intravenous contrast. COMPARISON:  02/06/2018 head CT.  10/12/2004 MRI/MRA head. FINDINGS: Brain: Mild frontoparietal volume loss with ex vacuo dilatation. Sequela of remote left caudate body and left thalamic insults. Mild chronic microvascular ischemic changes. No acute infarct. Remote right occipital microhemorrhage. No midline shift, ventriculomegaly or extra-axial fluid collection. No mass lesion. Vascular: Major intracranial flow voids are grossly preserved. Skull and upper cervical spine: Normal marrow signal. Sinuses/Orbits: Normal orbits. Sequela of bilateral lens replacement. Mild ethmoid and right maxillary sinus mucosal thickening. Trace right mastoid effusion. Other: None. IMPRESSION: No acute intracranial process. Sequela of remote left caudate body and left thalamic insults. Remote right occipital microhemorrhage. Mild frontoparietal atrophy, progressed since prior exam. Mild chronic microvascular ischemic changes. Electronically Signed   By: Primitivo Gauze M.D.   On: 01/12/2020 11:33    Assessment & Plan:   Ronell was seen today for atrial fibrillation.  Diagnoses and all orders for this visit:  Persistent atrial fibrillation- Will lower the  dosage of Eliquis at his request. -     apixaban (ELIQUIS) 2.5 MG TABS tablet; Take 1 tablet (2.5 mg total) by mouth 2 (two) times daily.  Essential hypertension- His blood pressure is adequately well controlled.  Flu vaccine need -     Flu Vaccine QUAD High Dose(Fluad)  Diabetes mellitus without complication ([place])- His blood sugar is adequately well controlled.  Gross hematuria- He agrees to proceed with a cystoscopy.  Hopefully the lower dose of Eliquis will prevent the episodes of hematuria..  Other orders -     Pneumococcal polysaccharide vaccine 23-valent greater than or equal to 2yo subcutaneous/IM   I have discontinued Princess Bruins. [place] "Bill"'s cholecalciferol, ferrous sulfate, and Eliquis. I am also having him start on apixaban. Additionally, I am having him maintain his Hypromellose (ARTIFICIAL TEARS OP), nitroGLYCERIN, triamcinolone cream, fexofenadine, sacubitril-valsartan, clobetasol, ketoconazole, rosuvastatin, dofetilide, traMADol, levocetirizine, acetaminophen, sodium chloride, Vitamin D-3, fluticasone, ipratropium, and spironolactone.  Meds ordered this encounter  Medications  . apixaban (ELIQUIS) 2.5 MG TABS tablet    Sig: Take 1 tablet (2.5 mg total) by mouth 2 (two) times daily.    Dispense:  180 tablet    Refill:  1     Follow-up: Return in about 6 months (around 08/22/2020).  [name], MD

## 2020-02-23 NOTE — Patient Instructions (Signed)

## 2020-02-24 DIAGNOSIS — Z23 Encounter for immunization: Secondary | ICD-10-CM | POA: Insufficient documentation

## 2020-02-24 DIAGNOSIS — R310 Gross hematuria: Secondary | ICD-10-CM | POA: Insufficient documentation

## 2020-03-15 DIAGNOSIS — R351 Nocturia: Secondary | ICD-10-CM | POA: Diagnosis not present

## 2020-03-15 DIAGNOSIS — N3041 Irradiation cystitis with hematuria: Secondary | ICD-10-CM | POA: Diagnosis not present

## 2020-03-15 DIAGNOSIS — Z8546 Personal history of malignant neoplasm of prostate: Secondary | ICD-10-CM | POA: Diagnosis not present

## 2020-03-15 DIAGNOSIS — R310 Gross hematuria: Secondary | ICD-10-CM | POA: Diagnosis not present

## 2020-03-15 DIAGNOSIS — N200 Calculus of kidney: Secondary | ICD-10-CM | POA: Diagnosis not present

## 2020-03-16 ENCOUNTER — Telehealth: Payer: Self-pay

## 2020-03-16 NOTE — Telephone Encounter (Signed)
   [facility] Pre-operative Risk Assessment    Request for surgical clearance:  1. What type of surgery is being performed? REMOVE DAMAGED TOENAIL   2. When is this surgery scheduled? TBD   3. What type of clearance is required (medical clearance vs. Pharmacy clearance to hold med vs. Both)? MEDICATION  4. Are there any medications that need to be held prior to surgery and how long? ELIQUIS   5. Practice name and name of physician performing surgery? [facility] [place]   6. What is the office phone number? [phone]   7.   What is the office fax number? [phone]  8.   Anesthesia type (None, local, MAC, general) ? NOT LISTED

## 2020-03-16 NOTE — Telephone Encounter (Signed)
Called both numbers, unable to leave VM

## 2020-03-21 NOTE — Telephone Encounter (Signed)
[place], can you please comment on how long patient can hold Eliquis for?  Thanks, Kelii Chittum Kathlen Mody, PA-C

## 2020-03-21 NOTE — Telephone Encounter (Signed)
Patient with diagnosis of afib on Eliquis for anticoagulation.    Procedure: remove damaged toenail Date of procedure: TBD  CHA2DS2-VASc Score = 6  This indicates a 9.7% annual risk of stroke. The patient's score is based upon: CHF History: 1 HTN History: 1 Diabetes History: 1 Stroke History: 0 Vascular Disease History: 1 Age Score: 2 Gender Score: 0  CrCl 10mL/min Platelet count 147K  Per office protocol, patient can hold Eliquis for 1 day prior to procedure.

## 2020-03-21 NOTE — Telephone Encounter (Signed)
   Primary Cardiologist: [name], MD  Chart reviewed as part of pre-operative protocol coverage. Patient was last seen 12/05/19 by Dr. [name] and was stable from a cardiac standpoint. He denies any changes in symptoms. No chest pain, shortness of breath, palpitations, lower leg swelling, or orthopnea.  Given past medical history and time since last visit, based on ACC/AHA guidelines, Dillon Young would be at acceptable risk for the planned procedure without further cardiovascular testing.   Per pharmacy recommendations it os okay to hold Eliquis 1 day prior to surgery.   The patient was advised that if he develops new symptoms prior to surgery to contact our office to arrange for a follow-up visit, and he verbalized understanding.  I will route this recommendation to the requesting party via Epic fax function and remove from pre-op pool.  Please call with questions.  January Bergthold Ninfa Meeker, PA-C 03/21/2020, 4:05 PM

## 2020-03-25 NOTE — Telephone Encounter (Signed)
   [facility] Pre-operative Risk Assessment    Request for surgical clearance:  1. What type of surgery is being performed? NAIL PROCEDURE ON FOOT  2. When is this surgery scheduled? TBD   3. What type of clearance is required (medical clearance vs. Pharmacy clearance to hold med vs. Both)? Medication   4. Are there any medications that need to be held prior to surgery and how long? ELIQUIS - needs to be held 2 days prior, day of, and at least 1 day after surgery  5. Practice name and name of physician performing surgery? [facility] - Dr. Gershon Mussel  6. What is your office phone number [phone]  7.   What is your office fax number [phone]  8.   Anesthesia type (None, local, MAC, general) ? Local    Dillon Young 03/25/2020, 2:10 PM  _________________________________________________________________   (provider comments below)  Dr[facility] office received the clearance fax - they further clarifying regarding how long patient can come off of eloquis as they need the amount of days mentioned in the above clearance request.

## 2020-03-25 NOTE — Telephone Encounter (Signed)
   On further review, this is a duplicate request. Will refax clearance below.   Abigail Butts, PA-C  03/25/20 ; 5:13 PM

## 2020-04-01 NOTE — Telephone Encounter (Signed)
   Please see completed pre-op risk assessment below. Per Pharmacy and office protocol, patient can hold Eliquis for 1 day prior to procedure. This should be resumed as soon as possible following procedure.   Will re-fax below recommendation to requesting office.  Thank you! Sande Rives, PA-C

## 2020-04-01 NOTE — Telephone Encounter (Signed)
° ° °  Elmyra Ricks from Dr. [facility] office calling back, she said they need a specific clearance for pt stating pt can hold ELIQUIS - needs to be held 2 days prior, day of, and at least 1 day after surgery. It is still for removal of damaged toenail.

## 2020-04-04 ENCOUNTER — Encounter: Payer: Self-pay | Admitting: Internal Medicine

## 2020-04-06 ENCOUNTER — Other Ambulatory Visit: Payer: Self-pay | Admitting: Internal Medicine

## 2020-04-06 DIAGNOSIS — I10 Essential (primary) hypertension: Secondary | ICD-10-CM

## 2020-04-06 DIAGNOSIS — I519 Heart disease, unspecified: Secondary | ICD-10-CM

## 2020-04-06 MED ORDER — HYDRALAZINE HCL 25 MG PO TABS: 25.0000 mg | ORAL_TABLET | Freq: Three times a day (TID) | ORAL | 5 refills | Status: DC

## 2020-04-11 MED ORDER — NITROGLYCERIN 0.4 MG SL SUBL: 0.4000 mg | SUBLINGUAL_TABLET | SUBLINGUAL | 3 refills | Status: DC | PRN

## 2020-04-19 ENCOUNTER — Ambulatory Visit (HOSPITAL_COMMUNITY)
Admission: RE | Admit: 2020-04-19 | Discharge: 2020-04-19 | Disposition: A | Discharge status: Home / Self Care | Payer: HMO | Source: Ambulatory Visit | Attending: Cardiology | Admitting: Cardiology

## 2020-04-19 ENCOUNTER — Other Ambulatory Visit: Payer: Self-pay

## 2020-04-19 ENCOUNTER — Encounter (HOSPITAL_COMMUNITY): Payer: Self-pay

## 2020-04-19 VITALS — BP 164/80 | HR 85 | Wt 178.4 lb

## 2020-04-19 DIAGNOSIS — I480 Paroxysmal atrial fibrillation: Secondary | ICD-10-CM | POA: Insufficient documentation

## 2020-04-19 DIAGNOSIS — I493 Ventricular premature depolarization: Secondary | ICD-10-CM | POA: Diagnosis not present

## 2020-04-19 DIAGNOSIS — I10 Essential (primary) hypertension: Secondary | ICD-10-CM | POA: Diagnosis not present

## 2020-04-19 DIAGNOSIS — Z87891 Personal history of nicotine dependence: Secondary | ICD-10-CM | POA: Diagnosis not present

## 2020-04-19 DIAGNOSIS — I255 Ischemic cardiomyopathy: Secondary | ICD-10-CM | POA: Diagnosis not present

## 2020-04-19 DIAGNOSIS — I2510 Atherosclerotic heart disease of native coronary artery without angina pectoris: Secondary | ICD-10-CM | POA: Insufficient documentation

## 2020-04-19 DIAGNOSIS — I5022 Chronic systolic (congestive) heart failure: Secondary | ICD-10-CM

## 2020-04-19 DIAGNOSIS — Z7901 Long term (current) use of anticoagulants: Secondary | ICD-10-CM | POA: Diagnosis not present

## 2020-04-19 DIAGNOSIS — E785 Hyperlipidemia, unspecified: Secondary | ICD-10-CM | POA: Diagnosis not present

## 2020-04-19 DIAGNOSIS — Z955 Presence of coronary angioplasty implant and graft: Secondary | ICD-10-CM | POA: Diagnosis not present

## 2020-04-19 DIAGNOSIS — I110 Hypertensive heart disease with heart failure: Secondary | ICD-10-CM | POA: Insufficient documentation

## 2020-04-19 DIAGNOSIS — Z79899 Other long term (current) drug therapy: Secondary | ICD-10-CM | POA: Insufficient documentation

## 2020-04-19 DIAGNOSIS — Z8249 Family history of ischemic heart disease and other diseases of the circulatory system: Secondary | ICD-10-CM | POA: Insufficient documentation

## 2020-04-19 DIAGNOSIS — I6529 Occlusion and stenosis of unspecified carotid artery: Secondary | ICD-10-CM | POA: Insufficient documentation

## 2020-04-19 LAB — CBC
HCT: 44.9 % (ref 39.0–52.0)
Hemoglobin: 14.1 g/dL (ref 13.0–17.0)
MCH: 27.2 pg (ref 26.0–34.0)
MCHC: 31.4 g/dL (ref 30.0–36.0)
MCV: 86.5 fL (ref 80.0–100.0)
Platelets: 180 10*3/uL (ref 150–400)
RBC: 5.19 MIL/uL (ref 4.22–5.81)
RDW: 14.8 % (ref 11.5–15.5)
WBC: 4.4 10*3/uL (ref 4.0–10.5)
nRBC: 0 % (ref 0.0–0.2)

## 2020-04-19 LAB — BASIC METABOLIC PANEL
Anion gap: 10 (ref 5–15)
BUN: 26 mg/dL — ABNORMAL HIGH (ref 8–23)
CO2: 24 mmol/L (ref 22–32)
Calcium: 9.2 mg/dL (ref 8.9–10.3)
Chloride: 107 mmol/L (ref 98–111)
Creatinine, Ser: 1.22 mg/dL (ref 0.61–1.24)
GFR, Estimated: 58 mL/min — ABNORMAL LOW (ref >60)
Glucose, Bld: 108 mg/dL — ABNORMAL HIGH (ref 70–99)
Potassium: 4.7 mmol/L (ref 3.5–5.1)
Sodium: 141 mmol/L (ref 135–145)

## 2020-04-19 MED ORDER — AMLODIPINE BESYLATE 5 MG PO TABS
5.0000 mg | ORAL_TABLET | Freq: Every day | ORAL | 11 refills | Status: DC
Start: 2020-04-19 — End: 2020-05-06

## 2020-04-19 NOTE — Addendum Note (Signed)
Encounter addended by: Kameshia Madruga M, CMA on: 04/19/2020 11:00 AM  Actions taken: Charge Capture section accepted

## 2020-04-19 NOTE — Patient Instructions (Signed)
START Amlodipine 5 mg, one tab daily  Labs today We will only contact you if something comes back abnormal or we need to make some changes. Otherwise no news is good news!  Your physician recommends that you schedule a follow-up appointment in: 2-3 weeks with the pharmacy team  Keep follow up as scheduled with Dr [name]  If you have any questions or concerns before your next appointment please send [place] a message through [facility] or call our office at [phone].    TO LEAVE A MESSAGE FOR THE NURSE SELECT OPTION 2, PLEASE LEAVE A MESSAGE INCLUDING: . YOUR NAME . DATE OF BIRTH . CALL BACK NUMBER . REASON FOR CALL**this is important as we prioritize the call backs  YOU WILL RECEIVE A CALL BACK THE SAME DAY AS LONG AS YOU CALL BEFORE 4:00 PM

## 2020-04-19 NOTE — Progress Notes (Signed)
Date:  04/19/2020   ID:  Dillon Young, DOB [birth year], MRN [id number]   Provider location: [facility] Type of Visit: Established patient   PCP:  Dillon Lima, MD  Cardiologist:  Dillon Burow, MD HF Cardiology: Dr. Aundra Young   History of Present Illness: Dillon Young is a 86 y.o. male who has a h/o coronary artery disease, chronic systolic CHF, and persistent atrial fibrillation, he was initially referred by Dr. Gwenlyn Young for evaluation of CHF.  He is status post angioplasty in 1994 and 1995of his circumflex coronary artery. He had right carotid endarterectomy performed by Dr. Curt Jews June 2013.  Given angina, he had cath in 8/17.  This showed severe RCA stenosis treated with DES. He also had a very small ramus branch that was 90% stenosed and not suitable for intervention as well as a 70% mid LAD lesion although he had no evidence of anterior ischemia on his stress test.  He was diagnosed in 5/19 with new onset atrial fibrillation. Echo was done in 5/19 and showed EF decreased to 30-35%, normal EF by echo in August 2017. He had a DCCV in 5/19, unfortunately, he only held NSR for a few days and was been back in atrial fibrillation soon after that.  He had recurrent chest pain and was again cathed in 01/04/18, this time showing 80% proximal/mid LAD that was treated with DES.  In 10/19, he was admitted for Tikosyn load and was cardioverted back to NSR.   Echo in 1/20 showed  EF up to 55-60% with mild LVH.   He presents to clinic for f/u for HTN. BPs have been running higher at home, in 478G-956O systolic. Denies increased intake of sodium. Notes occasional ETOH but not daily. No associated wt gain. No CP or dyspnea. Recently saw his PCP who added hydralazine 25 mg tid, however he has not noticed any significant improvement since starting it. BP today remains elevated at 164/80. Has already taken his AM meds today.   He also notes recent hematuria. His PCP  instructed him to reduce Eliquis down to 2.5 mg bid. He has f/u w/ his urologist this coming Friday.   He is also concerned that he may be "back in afib". Has had palpitations and felt that he "went out of rhythm" several days ago. EKG today shows NSR w/ frequent PVCs in a pattern of trigeminy.   ECG (personally reviewed): NSR w/ frequent PVCs in a pattern of trigeminy.    Labs (5/19): LDL 50 Labs (8/19): K 4.1, creatinine 1.14 Labs (10/19): K 4.6, creatinine 1.17 Labs (11/19): K 4.5, creatinine 1.08 Labs (1/20): LDL 48 Labs (2/20): K 4.4, creatinine 1.18 Labs (2/21): K 4.2, creatinine 1.14, LDL 35 Labs (6/21): K 4.5, creatinine 1.05 Labs (9/21): k 4.4, creatinine 1.14, Hgb A1c 6.5   PMH: 1. HTN 2. Hyperlipidemia 3. Carotid artery disease: Right CEA in 6/13.  - Carotid dopplers (5/20): 1-39% BICA stenosis, left subclavian stenosis.  - Carotid dopplers (5/21): s/p right CEA, 13-08% LICA stenosis.  4. CAD: - PTCA to LCx in 1994, 1995.  - Angina 8/17 => DES to RCA, also noted to have 70% mLAD, 90% stenosis small ramus.  - LHC 12/26/17 with 80% ostial-proximal LAD, EF < 25% on LV-gram.  DES to LAD.  5. Chronic systolic CHF: Ischemic cardiomyopathy.   - Echo (8/17) with normal EF.  - Echo (5/19) with EF 30-35%, severe LV dilation, regional wall motion abnormalities.  -  Echo (1/20): EF 55-60%, mild LVH, normal RV size and systolic function.  6. Atrial fibrillation: Diagnosed 5/19.   - DCCV 5/19 only held for about 2 days.  - Tikosyn loaded in 10/19 and DCCV to NSR.   Current Outpatient Medications  Medication Sig Dispense Refill  . acetaminophen (TYLENOL) 500 MG tablet Take 500-1,000 mg by mouth every 6 (six) hours as needed (for headaches).    Marland Kitchen apixaban (ELIQUIS) 2.5 MG TABS tablet Take 1 tablet (2.5 mg total) by mouth 2 (two) times daily. 180 tablet 1  . Cholecalciferol (VITAMIN D-3) 25 MCG (1000 UT) CAPS Take 1,000 Units by mouth daily.    . clobetasol (TEMOVATE) 0.05 % external  solution Apply 1 application topically See admin instructions. Apply to affected areas of the back twice weekly    . dofetilide (TIKOSYN) 125 MCG capsule TAKE 1 CAPSULE BY MOUTH TWICE DAILY 180 capsule 3  . fexofenadine (ALLEGRA) 180 MG tablet Take 180 mg by mouth daily as needed for allergies or rhinitis.     . fluticasone (FLONASE) 50 MCG/ACT nasal spray Place 2 sprays into both nostrils daily as needed for allergies or rhinitis. 48 g 1  . hydrALAZINE (APRESOLINE) 25 MG tablet Take 1 tablet (25 mg total) by mouth 3 (three) times daily. 90 tablet 5  . Hypromellose (ARTIFICIAL TEARS OP) Place 1 drop into both eyes 4 (four) times daily as needed (dry eyes).    Marland Kitchen ipratropium (ATROVENT) 0.06 % nasal spray Place 2 sprays into both nostrils 4 (four) times daily as needed for rhinitis. 45 mL 1  . levocetirizine (XYZAL) 5 MG tablet Take 5 mg by mouth daily as needed for allergies.    . nitroGLYCERIN (NITROSTAT) 0.4 MG SL tablet Place 1 tablet (0.4 mg total) under the tongue every 5 (five) minutes as needed for chest pain. 25 tablet 3  . rosuvastatin (CRESTOR) 10 MG tablet TAKE 1 TABLET (10 MG TOTAL) BY MOUTH DAILY. NEEDS APPT FOR FUTURE REFILLS 90 tablet 2  . sacubitril-valsartan (ENTRESTO) 97-103 MG Take 1 tablet by mouth 2 (two) times daily. 180 tablet 2  . sodium chloride (OCEAN) 0.65 % SOLN nasal spray Place 1-2 sprays into both nostrils as needed for congestion.    Marland Kitchen spironolactone (ALDACTONE) 25 MG tablet Take 1 tablet (25 mg total) by mouth daily. 90 tablet 3  . traMADol (ULTRAM) 50 MG tablet Take 1 tablet (50 mg total) by mouth every 6 (six) hours as needed. 75 tablet 1  . triamcinolone cream (KENALOG) 0.1 % Apply 1 application topically daily as needed (to affected areas, for itching).      No current facility-administered medications for this encounter.    Allergies:   Lipitor [atorvastatin] and Other   Social History:  The patient  reports that he quit smoking about 11 years ago. His smoking  use included cigarettes. He has a 90.00 pack-year smoking history. He has never used smokeless tobacco. He reports current alcohol use. He reports that he does not use drugs.   Family History:  The patient's family history includes Asthma in his paternal grandmother; Heart disease in his father; Hypertension in his mother.   ROS:  Please see the history of present illness.   All other systems are personally reviewed and negative.   Vital Signs:   BP (!) 164/80   Pulse 85   Wt 80.9 kg (178 lb 6.4 oz)   SpO2 97%   BMI 26.35 kg/m   PHYSICAL EXAM: General:  Well appearing  elderly WM. No respiratory difficulty HEENT: normal Neck: supple. no JVD. Carotids 2+ bilat; no bruits. No lymphadenopathy or thyromegaly appreciated. Cor: PMI nondisplaced. Irregular rhythm (frequent PVCs) No rubs, gallops or murmurs. Lungs: clear Abdomen: soft, nontender, nondistended. No hepatosplenomegaly. No bruits or masses. Good bowel sounds. Extremities: no cyanosis, clubbing, rash, edema Neuro: alert & oriented x 3, cranial nerves grossly intact. moves all 4 extremities w/o difficulty. Affect pleasant.    Recent Labs: 10/30/2019: Magnesium 2.0 02/05/2020: ALT 6; BUN 14; Creat 1.14; Hemoglobin 13.4; Platelets 147; Potassium 4.4; Sodium 141  Personally reviewed   Wt Readings from Last 3 Encounters:  04/19/20 80.9 kg (178 lb 6.4 oz)  02/23/20 80.3 kg (177 lb)  02/04/20 79.8 kg (176 lb)      ASSESSMENT AND PLAN:  1. Chronic systolic CHF: Suspect mixed ischemic/nonischemic cardiomyopathy.  Echo in 5/19 with EF 30-35% and severe LV dilation.  CAD likely plays role, but fall in EF was noted around the time he went into atrial fibrillation.  No RVR, but can see fall in EF with afib even in the absence of RVR. He has been in NSR on Tikosyn, and echo from 1/20 showed EF up to 55-60%.  On exam, he is euvolemic.  NYHA class II symptoms. EKG shows frequent PVCs.  - He is off Coreg due to bradycardia.  - Continue  Entresto 97/103 bid.   - Continue spironolactone 25 mg daily.  - Continue hydralazine 25 mg tid  - Plan 72 hr Zio patch to quantify PVCs. If High burden, will plan repeat echo to reassess LVEF.  - Check BMP today  2. CAD: Most recently, DES to proximal/mid LAD on 12/26/17. No chest pain since then.  He was on Plavix for > 6 months then stopped it with gross hematuria.  - Continue apixaban.  Check CBC today.  - No ASA with apixaban use.  - Continue Crestor, good lipids in 2/21.  3. Atrial fibrillation: Paroxysmal.  DCCV in 5/19 only held a few days.  Tikosyn loaded in 10/19 with DCCV, now in NSR but w/ frequent PVCs on EKG today.  QTc ok on ECG, 406/462 ms - Continue Eliquis. His dose was recently reduced down to 2.5 mg bid given hematuria, however he is at a risk for CVA on reduced dose. Based on dosing criteria using age, wt and renal function, he should be on full dose 5 mg bid. Will check CBC today to ensure H/H stable. If so, would recommend he go back to full dose and keep f/u w/ urology this week.  - Continue Tikosyn, check BMET and Mg.  4. HTN: BP has been elevated for several weeks  - continue current regimen as outlined above and will start amlodipine 5 mg bid  - avoiding  blocker for now given h/o bradycardia - will assess w/ Zio patch to quantify PVCs. May consider retrial of low dose Coreg if no significant bradycardia observed on monitor - Also w/ PVCs and elevated BP ? OSA. Will consider future sleep study if symptoms persists  5. Carotid stenosis: Repeat carotid dopplers 5/22.    Followup in 6 months.   F/u in 2-3 weeks   Signed, Lyda Jester, PA-C  04/19/2020  [place] [address] [facility] [place] [address] [phone] (office) [phone] (fax)

## 2020-04-19 NOTE — Addendum Note (Signed)
Encounter addended by: Consuelo Pandy, PA-C on: 04/19/2020 10:22 AM  Actions taken: Medication List reviewed, Problem List reviewed, Allergies reviewed

## 2020-04-20 ENCOUNTER — Ambulatory Visit: Payer: HMO | Admitting: Podiatry

## 2020-04-20 ENCOUNTER — Encounter: Payer: Self-pay | Admitting: Podiatry

## 2020-04-20 DIAGNOSIS — G629 Polyneuropathy, unspecified: Secondary | ICD-10-CM

## 2020-04-20 DIAGNOSIS — E0843 Diabetes mellitus due to underlying condition with diabetic autonomic (poly)neuropathy: Secondary | ICD-10-CM | POA: Diagnosis not present

## 2020-04-20 MED ORDER — GABAPENTIN 100 MG PO CAPS
100.0000 mg | ORAL_CAPSULE | Freq: Three times a day (TID) | ORAL | 3 refills | Status: DC
Start: 2020-04-20 — End: 2020-06-30

## 2020-04-20 NOTE — Progress Notes (Signed)
   HPI: 86 y.o. male presenting today as a reestablish new patient for evaluation of burning with pins-and-needles and numbness to the bilateral tips of the toes.  Patient does have history of diabetes mellitus.  He states that over the last several years she has had numbness with stinging sensation.  He presents for further treatment evaluation  Past Medical History:  Diagnosis Date  . Adenomatous colon polyp   . Allergic rhinitis   . BPH (benign prostatic hyperplasia)   . CAD (coronary artery disease)    sees Dr. [name]   . Carotid artery disease ([place])   . CHF (congestive heart failure) ([place])   . Chronic kidney disease    [place]. Prostate cancer  . Clotting disorder ([place])   . CVA (cerebral infarction)   . Diabetes mellitus    "Borderline"  . Diverticulitis   . GERD (gastroesophageal reflux disease)    history  . Hemorrhoids   . HTN (hypertension)   . Hyperlipidemia LDL goal <70   . IBS (irritable bowel syndrome)   . Low back pain   . Prostate cancer ([place]) 2008   seed implantation  . Sleep apnea    last study was aborted.  Has never worn a CPAP.    Marland Kitchen Smoker      Physical Exam: General: The patient is alert and oriented x3 in no acute distress.  Dermatology: Skin is warm, dry and supple bilateral lower extremities. Negative for open lesions or macerations.  Vascular: Palpable pedal pulses bilaterally. No edema or erythema noted. Capillary refill within normal limits.  Neurological: Epicritic and protective threshold diminished bilaterally.  Paresthesia with stinging noted to the bilateral forefoot consistent with a peripheral polyneuropathy  Musculoskeletal Exam: Range of motion within normal limits to all pedal and ankle joints bilateral. Muscle strength 5/5 in all groups bilateral.   Assessment: 1.  Diabetes mellitus with peripheral polyneuropathy   Plan of Care:  1. Patient evaluated.  Diabetic comprehensive exam performed today 2.  Prescription for  gabapentin 100 mg begin nightly.  Patient has most symptoms at night when going to bed 3.  Return to clinic as needed      Edrick Kins, DPM [facility]  Dr. Edrick Kins, DPM    [address], [place] [address]                Office [phone]  Fax [phone]

## 2020-04-25 ENCOUNTER — Ambulatory Visit: Payer: HMO | Admitting: Podiatry

## 2020-04-25 DIAGNOSIS — D400 Neoplasm of uncertain behavior of prostate: Secondary | ICD-10-CM | POA: Diagnosis not present

## 2020-04-25 DIAGNOSIS — R310 Gross hematuria: Secondary | ICD-10-CM | POA: Diagnosis not present

## 2020-04-26 ENCOUNTER — Telehealth (HOSPITAL_COMMUNITY): Payer: Self-pay | Admitting: *Deleted

## 2020-04-26 NOTE — Telephone Encounter (Signed)
-----   Message from Consuelo Pandy, [place] sent at 04/19/2020  1:57 PM EST ----- Hgb ok. Normal at 14. He should go back on Eliquis 5 mg bid.

## 2020-04-27 ENCOUNTER — Other Ambulatory Visit: Payer: Self-pay | Admitting: Urology

## 2020-04-27 ENCOUNTER — Telehealth: Payer: Self-pay | Admitting: Cardiovascular Disease

## 2020-04-27 ENCOUNTER — Ambulatory Visit: Payer: HMO | Admitting: Podiatry

## 2020-04-27 NOTE — Telephone Encounter (Signed)
   [facility] Pre-operative Risk Assessment    Request for surgical clearance:  1. What type of surgery is being performed? Biopsy of Prostatic Lesion   2. When is this surgery scheduled? 05/10/20   3. What type of clearance is required (medical clearance vs. Pharmacy clearance to hold med vs. Both)? Both   4. Are there any medications that need to be held prior to surgery and how long? Eliquis - 2 days prior   5. Practice name and name of physician performing surgery? [facility]   Dr. Irine Seal   6. What is your office phone number? [phone] (ext#: [phone])    7.   What is your office fax number? [phone]  8.   Anesthesia type (None, local, MAC, general) ? General    Zara Council 04/27/2020, 1:45 PM  _________________________________________________________________

## 2020-04-28 NOTE — Telephone Encounter (Signed)
Hi Dillon Young You saw this patient on 04/19/20 and recommended a zio patch for PVC burden. I just looked and I do not see a download of preliminary results. We are asked for clearance for prostate biopsy. Can you provide any recommendations on clearance and whether or not we need to wait on the monitor results?

## 2020-04-28 NOTE — Telephone Encounter (Signed)
Patient with diagnosis of ATRIAL FIBRILLATION on ELIQUIS for anticoagulation.    Procedure: PROSTATE BIOPSY Date of procedure: 05/10/2020    CHA2DS2-VASc Score = 5  This indicates a 7.2% annual risk of stroke. The patient's score is based upon: CHF History: 1 HTN History: 1 Diabetes History: 0 Stroke History: 0 Vascular Disease History: 1 Age Score: 2 Gender Score: 0    CrCl = 50 MG/ML Platelet count = 180  Per office protocol, patient can hold ELIQUIS for 2 days prior to procedure.

## 2020-05-02 ENCOUNTER — Other Ambulatory Visit: Payer: Self-pay

## 2020-05-02 NOTE — Patient Outreach (Signed)
  [facility]) Care Management Chronic Special Needs Program    05/02/2020  Name: Dillon Young, DOB: [birth year]  MRN: [id number]   Mr. Dillon Young is enrolled in a chronic special needs plan for Heart Failure.  [facility] [employer] Care Management Team has assumed care and services for this member. Case closed by [place] RN, [facility] [place] Management [phone]

## 2020-05-02 NOTE — Progress Notes (Signed)
PCP:  [name], MD   Cardiologist:  [name], MD HF Cardiology: Dr. [name]  HPI:  Dillon Young is a 86 y.o. male who has a h/o coronary artery disease, chronic systolic CHF, and persistent atrial fibrillation, he was initially referred by Dr. [name] for evaluation of CHF. He is status post angioplasty in 1994 and 1995of his circumflex coronary artery. He had right carotid endarterectomy performed by Dr. Curt Jews June 2013. Given angina, he had cath in 8/17. This showed severe RCA stenosis treated with DES. He also had a very small ramus branch that was 90% stenosed and not suitable for intervention as well as a 70% mid LAD lesion although he had no evidence of anterior ischemia on his stress test. He was diagnosed in 5/19 with new onset atrial fibrillation. Echo was done in 5/19 and showed EF decreased to 30-35%, normal EF by echo in August 2017. He had a DCCV in 5/19, unfortunately, he only held NSR for a few days and was been back in atrial fibrillation soon after that. He had recurrent chest pain and was again cathed in 01/04/18, this time showing 80% proximal/mid LAD that was treated with DES. In 10/19, he was admitted for Tikosyn load and was cardioverted back to NSR.   Echo in 1/20 showed EF up to 55-60% with mild LVH.   He recently presented to clinic for f/u for HTN on 04/19/20. BPs had been running higher at home, in 409W-119J systolic. Denied increased intake of sodium. Noted occasional ETOH but not daily. No associated weight gain. No CP or dyspnea. Recently had seen his PCP who added hydralazine 25 mg TID; however, he had not noticed any significant improvement since starting it. BP in clinic remained elevated at 164/80. Had already taken his AM meds that day. He also noted recent hematuria. His PCP instructed him to reduce Eliquis down to 2.5 mg BID. He had f/u w/ his urologist the following Friday. He was also concerned that he may be "back in afib". Had had  palpitations and felt that he "went out of rhythm" several days ago. EKG in clinic showed NSR w/ frequent PVCs in a pattern of trigeminy.    Today he returns to HF clinic for pharmacist medication titration. At last visit with APP, amlodipine 5 mg daily was initiated and 72 hour Zio patch was added to assess PVC burden. His Eliquis was also increased back to 5 mg BID.  His Zio patch results returned and showed 22% PVCs, so he was started on carvedilol 3.125 mg BID and amlodipine was decreased to 2.5 mg daily. Overall is feeling fairly well today. Does note that he sometimes feels weak. He tries to walk 30 minutes every day and this makes him feel better. He does note dizziness and lightheadedness approximately 1 hour after taking morning medications. No SOB/DOE. Weight at home has been stable, ranging 174-178 lbs. He does not take any diuretic. Did note LEE with amlodipine, which has improved since decreasing to the 2.5 mg daily dose. No PND/orthopnea. BP in clinic today 144/56. Noted that his SBPs at home run 120-130. Had echo today and has appointment with EP this afternoon.    HTN/HF Medications: Carvedilol 3.125 mg BID Amlodipine 2.5 mg daily Entresto 97/103 mg BID Spironolactone 25 mg daily Hydralazine 25 mg TID   Has the patient been experiencing any side effects to the medications prescribed?  Yes - LEE with amlodipine. Has improved since dose was decreased to 2.5 mg  daily.   Does the patient have any problems obtaining medications due to transportation or finances?   no  Understanding of regimen: good Understanding of indications: good Potential of compliance: good Patient understands to avoid NSAIDs. Patient understands to avoid decongestants.    Pertinent Lab Values: . 05/05/20: Serum creatinine 1.18, BUN 24, Potassium 4.5, Sodium 141 . 05/16/20: BMET and Mg pending  Vital Signs: . Weight: 180.6 lbs (last clinic weight: 178.4 lbs) . Blood pressure: 144/56  . Heart rate: 58    Assessment: 1. Chronic systolic CHF: Suspect mixed ischemic/nonischemic cardiomyopathy.Echo in 5/19 with EF 30-35% and severe LV dilation. CAD likely plays role, but fall in EF was noted around the time he went into atrial fibrillation. No RVR, but can see fall in EF with afib even in the absence of RVR. Echo from 1/20 showed EF up to 55-60%. - Echo today pending - On exam, he is euvolemic.  NYHA class II symptoms.   - BMET, Mg level pending -Continue carvedilol 3.125 mg BID. Previously discontinued due to bradycardia. Unable to uptitrate further today given HR 58 in clinic.  -Continue Entresto 97/103 mg BID.  - Continue spironolactone 25 mg daily.  - Continue hydralazine 25 mg TID 2. CAD: Most recently, DES to proximal/mid LAD on 12/26/17. No chest pain since then.  He was on Plavix for > 6 months then stopped it with gross hematuria.  - Continue apixaban.  - No ASA with apixaban use.  - Continue Crestor,good lipids in 2/21.  3. Atrial fibrillation: Paroxysmal. DCCV in 5/19 only held a few days. Tikosyn loaded in 10/19 with DCCV, now in NSR but w/ frequent PVCs on EKG today. QTc ok on ECG 04/19/20, 406/462 ms - Continue Eliquis 5 mg BID - Continue Tikosyn, check BMET and Mg.  - Has EP appointment with Dr. Rayann Heman today.  4. HTN:  - Continue Entresto, hydralazine, amlodipine and spironolactone as above - w/ PVCs and elevated BP ? OSA. Will consider future sleep study if symptoms persists  5. Carotid stenosis: Repeat carotid dopplers 5/22.   Followup in 6 months.    Plan: 1) Medication changes: Based on clinical presentation, vital signs and recent labs will make no medication changes today 2) Labs: BMET, Mg pending 3) Follow-up: 1 month with Dr. Rush Farmer, PharmD, BCPS, [place], [facility] Pharmacist [phone]

## 2020-05-03 DIAGNOSIS — R002 Palpitations: Secondary | ICD-10-CM | POA: Diagnosis not present

## 2020-05-03 NOTE — Progress Notes (Signed)
COVID Vaccine Completed: x2 Date COVID Vaccine completed:  06-07-19 & 06-28-19 COVID vaccine manufacturer: [place]   PCP - [name], MD Cardiologist - [name], MD  Chest x-ray - 12-31-19 in Epic EKG - 04-19-20 in Epic Stress Test - 01-09-16 in Epic ECHO - 11-04-19 in Epic Cardiac Cath - 12-26-17 in Epic Pacemaker/ICD device last checked: Long Term Monitor - 04-19-20 in Epic Abdominal aortic aneurysm duplex - 10-07-12 in Epic  Sleep Study - Unable to to complete test CPAP - No  Fasting Blood Sugar -  Checks Blood Sugar _____ times a day  Blood Thinner Instructions:  Eliquis 5 mg.  Can hold for 2 days preop per note 04-27-20 Aspirin Instructions: Last Dose:  Anesthesia review: CAD, Afib, L ventricular dysfunction, CHF, PVCs, HTN.  Angioplasty 1994 and 1995  Patient denies shortness of breath, fever, cough and chest pain at PAT appointment.  Pt able to climb a flight of stairs and performs ADLs independently.   Patient verbalized understanding of instructions that were given to them at the PAT appointment. Patient was also instructed that they will need to review over the PAT instructions again at home before surgery.

## 2020-05-03 NOTE — Patient Instructions (Addendum)
DUE TO COVID-19 ONLY ONE VISITOR IS ALLOWED TO COME WITH YOU AND STAY IN THE WAITING ROOM ONLY DURING PRE OP AND PROCEDURE.   IF YOU WILL BE ADMITTED INTO THE HOSPITAL YOU ARE ALLOWED ONE SUPPORT PERSON DURING VISITATION HOURS ONLY (10AM -8PM)   . The support person may change daily. . The support person must pass our screening, gel in and out, and wear a mask at all times, including in the patient's room. . Patients must also wear a mask when staff or their support person are in the room.   COVID SWAB TESTING MUST BE COMPLETED ON:  Friday, 05-06-20 @ 10:15 AM   [address] [place], [place] [address]  (Must self quarantine after testing. Follow instructions on handout.)        Your procedure is scheduled on:  Tuesday, 05-10-20   Report to [facility] Main  Entrance   Report to admitting at 7:00 AM   Call this number if you have problems the morning of surgery [phone]   Do not eat food :After Midnight.   May have liquids until 6:00 AM day of surgery  CLEAR LIQUID DIET  Foods Allowed                                                                     Foods Excluded  Water, Black Coffee and tea, regular and decaf             liquids that you cannot  Plain Jell-O in any flavor  (No red)                                    see through such as: Fruit ices (not with fruit pulp)                                      milk, soups, orange juice              Iced Popsicles (No red)                                      All solid food                                   Apple juices Sports drinks like Gatorade (No red) Lightly seasoned clear broth or consume(fat free) Sugar, honey syrup    Oral Hygiene is also important to reduce your risk of infection.                                    Remember - BRUSH YOUR TEETH THE MORNING OF SURGERY WITH YOUR REGULAR TOOTHPASTE   Do NOT smoke after Midnight   Take these medicines the morning of surgery with A SIP OF WATER: Amlodipine,  Tikosyn, Allegra, Hydralazine, Crestor  You may not have any metal on your body including  jewelry, and body piercings             Do not wear  lotions, powders, perfumes/cologne, or deodorant             Men may shave face and neck.   Do not bring valuables to the hospital. [place].   Contacts, dentures or bridgework may not be worn into surgery.   Patients discharged the day of surgery will not be allowed to drive home.                Please read over the following fact sheets you were given: IF YOU HAVE QUESTIONS ABOUT YOUR PRE OP INSTRUCTIONS PLEASE CALL [phone]   [place] - Preparing for Surgery Before surgery, you can play an important role.  Because skin is not sterile, your skin needs to be as free of germs as possible.  You can reduce the number of germs on your skin by washing with CHG (chlorahexidine gluconate) soap before surgery.  CHG is an antiseptic cleaner which kills germs and bonds with the skin to continue killing germs even after washing. Please DO NOT use if you have an allergy to CHG or antibacterial soaps.  If your skin becomes reddened/irritated stop using the CHG and inform your nurse when you arrive at Short Stay. Do not shave (including legs and underarms) for at least 48 hours prior to the first CHG shower.  You may shave your face/neck.  Please follow these instructions carefully:  1.  Shower with CHG Soap the night before surgery and the  morning of surgery.  2.  If you choose to wash your hair, wash your hair first as usual with your normal  shampoo.  3.  After you shampoo, rinse your hair and body thoroughly to remove the shampoo.                             4.  Use CHG as you would any other liquid soap.  You can apply chg directly to the skin and wash.  Gently with a scrungie or clean washcloth.  5.  Apply the CHG Soap to your body ONLY FROM THE NECK DOWN.   Do   not use on  face/ open                           Wound or open sores. Avoid contact with eyes, ears mouth and   genitals (private parts).                       Wash face,  Genitals (private parts) with your normal soap.             6.  Wash thoroughly, paying special attention to the area where your    surgery  will be performed.  7.  Thoroughly rinse your body with warm water from the neck down.  8.  DO NOT shower/wash with your normal soap after using and rinsing off the CHG Soap.                9.  Pat yourself dry with a clean towel.            10.  Wear clean pajamas.  11.  Place clean sheets on your bed the night of your first shower and do not  sleep with pets. Day of Surgery : Do not apply any lotions/deodorants the morning of surgery.  Please wear clean clothes to the hospital/surgery center.  FAILURE TO FOLLOW THESE INSTRUCTIONS MAY RESULT IN THE CANCELLATION OF YOUR SURGERY  PATIENT SIGNATURE_________________________________  NURSE SIGNATURE__________________________________  ________________________________________________________________________

## 2020-05-04 NOTE — Telephone Encounter (Signed)
   Primary Cardiologist: [name], MD  Chart reviewed as part of pre-operative protocol coverage. Patient was recently seen by Lyda Jester, PA-C, on 04/19/2020 at which time he noted some palpitations and was concerned he may have gone back into atrial fibrillation several days prior. EKG showed normal sinus rhythm with PVCs in a trigeminy pattern. Zio monitor was ordered for further evaluation of PVCs but results of this are not back yet. Per Brittainy, OK to proceed with prostate biopsy.  Per Pharmacy and office protocol, patient can hold Eliquis for 2 days prior to procedure. This should be restarted as soon as possible after biopsy.  I will route this recommendation to the requesting party via Epic fax function and remove from pre-op pool.  Please call with questions.  Darreld Mclean, PA-C 05/04/2020, 8:18 AM

## 2020-05-05 ENCOUNTER — Encounter (HOSPITAL_COMMUNITY): Payer: Self-pay

## 2020-05-05 ENCOUNTER — Other Ambulatory Visit: Payer: Self-pay

## 2020-05-05 ENCOUNTER — Encounter (HOSPITAL_COMMUNITY)
Admission: RE | Admit: 2020-05-05 | Discharge: 2020-05-05 | Disposition: A | Discharge status: Home / Self Care | Payer: HMO | Source: Ambulatory Visit | Attending: Urology | Admitting: Urology

## 2020-05-05 DIAGNOSIS — E1122 Type 2 diabetes mellitus with diabetic chronic kidney disease: Secondary | ICD-10-CM | POA: Insufficient documentation

## 2020-05-05 DIAGNOSIS — N189 Chronic kidney disease, unspecified: Secondary | ICD-10-CM | POA: Diagnosis not present

## 2020-05-05 DIAGNOSIS — Z955 Presence of coronary angioplasty implant and graft: Secondary | ICD-10-CM | POA: Insufficient documentation

## 2020-05-05 DIAGNOSIS — G4730 Sleep apnea, unspecified: Secondary | ICD-10-CM | POA: Insufficient documentation

## 2020-05-05 DIAGNOSIS — I509 Heart failure, unspecified: Secondary | ICD-10-CM | POA: Diagnosis not present

## 2020-05-05 DIAGNOSIS — I130 Hypertensive heart and chronic kidney disease with heart failure and stage 1 through stage 4 chronic kidney disease, or unspecified chronic kidney disease: Secondary | ICD-10-CM | POA: Diagnosis not present

## 2020-05-05 DIAGNOSIS — K219 Gastro-esophageal reflux disease without esophagitis: Secondary | ICD-10-CM | POA: Insufficient documentation

## 2020-05-05 DIAGNOSIS — N429 Disorder of prostate, unspecified: Secondary | ICD-10-CM | POA: Insufficient documentation

## 2020-05-05 DIAGNOSIS — I2510 Atherosclerotic heart disease of native coronary artery without angina pectoris: Secondary | ICD-10-CM | POA: Insufficient documentation

## 2020-05-05 DIAGNOSIS — Z87891 Personal history of nicotine dependence: Secondary | ICD-10-CM | POA: Insufficient documentation

## 2020-05-05 DIAGNOSIS — Z01812 Encounter for preprocedural laboratory examination: Secondary | ICD-10-CM | POA: Diagnosis not present

## 2020-05-05 HISTORY — DX: Prediabetes: R73.03

## 2020-05-05 HISTORY — DX: Headache, unspecified: R51.9

## 2020-05-05 HISTORY — DX: Anemia, unspecified: D64.9

## 2020-05-05 HISTORY — DX: Anxiety disorder, unspecified: F41.9

## 2020-05-05 LAB — CBC
HCT: 43.6 % (ref 39.0–52.0)
Hemoglobin: 13.6 g/dL (ref 13.0–17.0)
MCH: 27.4 pg (ref 26.0–34.0)
MCHC: 31.2 g/dL (ref 30.0–36.0)
MCV: 87.7 fL (ref 80.0–100.0)
Platelets: 187 10*3/uL (ref 150–400)
RBC: 4.97 MIL/uL (ref 4.22–5.81)
RDW: 14.7 % (ref 11.5–15.5)
WBC: 4.2 10*3/uL (ref 4.0–10.5)
nRBC: 0 % (ref 0.0–0.2)

## 2020-05-05 LAB — HEMOGLOBIN A1C
Hgb A1c MFr Bld: 6.2 % — ABNORMAL HIGH (ref 4.8–5.6)
Mean Plasma Glucose: 131.24 mg/dL

## 2020-05-05 LAB — BASIC METABOLIC PANEL
Anion gap: 8 (ref 5–15)
BUN: 24 mg/dL — ABNORMAL HIGH (ref 8–23)
CO2: 27 mmol/L (ref 22–32)
Calcium: 9 mg/dL (ref 8.9–10.3)
Chloride: 106 mmol/L (ref 98–111)
Creatinine, Ser: 1.18 mg/dL (ref 0.61–1.24)
GFR, Estimated: >60 mL/min (ref >60)
Glucose, Bld: 196 mg/dL — ABNORMAL HIGH (ref 70–99)
Potassium: 4.5 mmol/L (ref 3.5–5.1)
Sodium: 141 mmol/L (ref 135–145)

## 2020-05-06 ENCOUNTER — Other Ambulatory Visit (HOSPITAL_COMMUNITY)
Admission: RE | Admit: 2020-05-06 | Discharge: 2020-05-06 | Disposition: A | Discharge status: Home / Self Care | Payer: HMO | Source: Ambulatory Visit | Attending: Urology | Admitting: Urology

## 2020-05-06 ENCOUNTER — Telehealth (HOSPITAL_COMMUNITY): Payer: Self-pay

## 2020-05-06 ENCOUNTER — Encounter (HOSPITAL_COMMUNITY): Payer: Self-pay

## 2020-05-06 DIAGNOSIS — Z20822 Contact with and (suspected) exposure to covid-19: Secondary | ICD-10-CM | POA: Diagnosis not present

## 2020-05-06 DIAGNOSIS — Z01812 Encounter for preprocedural laboratory examination: Secondary | ICD-10-CM | POA: Insufficient documentation

## 2020-05-06 DIAGNOSIS — I5022 Chronic systolic (congestive) heart failure: Secondary | ICD-10-CM

## 2020-05-06 LAB — SARS CORONAVIRUS 2 (TAT 6-24 HRS): SARS Coronavirus 2: NEGATIVE

## 2020-05-06 MED ORDER — CARVEDILOL 3.125 MG PO TABS: 3.1250 mg | ORAL_TABLET | Freq: Two times a day (BID) | ORAL | 11 refills | Status: DC

## 2020-05-06 MED ORDER — AMLODIPINE BESYLATE 2.5 MG PO TABS: 2.5000 mg | ORAL_TABLET | Freq: Every day | ORAL | 11 refills | Status: DC

## 2020-05-06 NOTE — Telephone Encounter (Signed)
Per patient mycahrt message he states: I have experienced swelling in both my feet and ankles since being on Amlodipine 5mg .   After speaking with patient He denies weight gain,sob.  Please advise

## 2020-05-06 NOTE — Telephone Encounter (Signed)
Patients wife advised and verbalized understanding. New Rx sent into pharmacy

## 2020-05-06 NOTE — Telephone Encounter (Signed)
Cut amlodipine to 2.5 mg daily.

## 2020-05-06 NOTE — Progress Notes (Signed)
Anesthesia Chart Review   Case: [id number] Date/Time: 05/10/20 0845   Procedure: CYSTOSCOPY TRANSURETHRAL RESECTION BIOPSY AND FULGURATION OF PROSTATE LESION  (TURP) (N/A )   Anesthesia type: General   Pre-op diagnosis: PROSTATE LESION   Location: [place] / [facility]   Surgeons: Irine Seal, MD      DISCUSSION:87 y.o. former smoker with h/o GERD, HTN, DM, CKD, sleep apnea, CHF, CAD, prostate lesion scheduled for above procedure 05/10/2020 with Dr. Irine Seal.   After reviewing Zio monitor Dr. [name] recommends Echo and EP referral due to PVC burden.  Discussed with cardiology who recommend postponing procedure until Echo is completed which is scheduled for 05/16/20. Surgeon's office made aware.  VS: BP (!) 155/59   Pulse 79   Temp 36.9 C (Oral)   Resp 14   Ht 5\' 9"  (1.753 m)   Wt 80.7 kg   SpO2 100%   BMI 26.29 kg/m   PROVIDERS: [name], MD is PCP   [name], MD is Cardiologist  LABS: Labs reviewed: Acceptable for surgery. (all labs ordered are listed, but only abnormal results are displayed)  Labs Reviewed  HEMOGLOBIN A1C - Abnormal; Notable for the following components:      Result Value   Hgb A1c MFr Bld 6.2 (*)    All other components within normal limits  BASIC METABOLIC PANEL - Abnormal; Notable for the following components:   Glucose, Bld 196 (*)    BUN 24 (*)    All other components within normal limits  CBC     IMAGES:   EKG: 04/19/2020 Rate 78 bpm  Sinus rhythm with 1st degree A-V block with frequent Premature ventricular complexes in a pattern of bigeminy Left anterior fascicular block Abnormal ECG Premature ventricular complexes New since previous tracing   CV: Echo 11/04/2019 IMPRESSIONS    1. Mild posterolateral hypokinesis. Left ventricular ejection fraction,  by estimation, is 50 to 55%. The left ventricle has low normal function.  The left ventricle demonstrates regional wall motion abnormalities (see  scoring  diagram/findings for  description). The left ventricular internal cavity size was mildly  dilated. Left ventricular diastolic parameters are consistent with Grade I  diastolic dysfunction (impaired relaxation). Elevated left ventricular  end-diastolic pressure.  2. Right ventricular systolic function is normal. The right ventricular  size is normal. There is normal pulmonary artery systolic pressure.  3. Left atrial size was severely dilated.  4. The mitral valve is normal in structure. No evidence of mitral valve  regurgitation. No evidence of mitral stenosis.  5. The aortic valve is tricuspid. Aortic valve regurgitation is trivial.  No aortic stenosis is present.  6. The inferior vena cava is normal in size with greater than 50%  respiratory variability, suggesting right atrial pressure of 3 mmHg.  Cardiac Cath 12/26/2017   Previously placed Prox RCA to Mid RCA stent (unknown type) is widely patent.  Prox LAD to Mid LAD lesion is 80% stenosed.  A stent was successfully placed.  Post intervention, there is a 0% residual stenosis.  There is severe left ventricular systolic dysfunction.  LV end diastolic pressure is mildly elevated.  The left ventricular ejection fraction is less than 25% by visual estimate.   Past Medical History:  Diagnosis Date  . Adenomatous colon polyp   . Allergic rhinitis   . Anemia   . Anxiety   . BPH (benign prostatic hyperplasia)   . CAD (coronary artery disease)    sees Dr. [name]   .  Carotid artery disease ([place])   . CHF (congestive heart failure) ([place])   . Chronic kidney disease    [place]. Prostate cancer  . Clotting disorder ([place])   . CVA (cerebral infarction)   . Diabetes mellitus    "Borderline"  . Diverticulitis   . GERD (gastroesophageal reflux disease)    history  . Headache   . Hemorrhoids   . HTN (hypertension)   . Hyperlipidemia LDL goal <70   . IBS (irritable bowel syndrome)   . Low back pain   . Pre-diabetes    . Prostate cancer ([place]) 2008   seed implantation  . Sleep apnea    last study was aborted.  Has never worn a CPAP.    Marland Kitchen Smoker     Past Surgical History:  Procedure Laterality Date  . APPENDECTOMY    . CARDIAC CATHETERIZATION N/A 01/16/2016   Procedure: Left Heart Cath and Coronary Angiography;  Surgeon: Jettie Booze, MD;  Location: [place] CV LAB;  Service: Cardiovascular;  Laterality: N/A;  . CARDIAC CATHETERIZATION N/A 01/16/2016   Procedure: Coronary Stent Intervention;  Surgeon: Jettie Booze, MD;  Location: [place] [facility] LAB;  Service: Cardiovascular;  Laterality: N/A;  . CARDIOVERSION N/A 11/26/2017   Procedure: CARDIOVERSION;  Surgeon: [name], MD;  Location: [facility] ENDOSCOPY;  Service: Cardiovascular;  Laterality: N/A;  . CARDIOVERSION N/A 03/13/2018   Procedure: CARDIOVERSION;  Surgeon: [name], MD;  Location: [facility] ENDOSCOPY;  Service: Cardiovascular;  Laterality: N/A;  . CATARACT EXTRACTION W/ INTRAOCULAR LENS  IMPLANT, BILATERAL Bilateral   . COLONOSCOPY  10/21/2008   diverticulosis, radiation proctitis, internal hemorrhoids  . CORONARY STENT INTERVENTION N/A 12/26/2017   Procedure: CORONARY STENT INTERVENTION;  Surgeon: [name], MD;  Location: [place] [facility] LAB;  Service: Cardiovascular;  Laterality: N/A;  . ENDARTERECTOMY  11/12/2011   Procedure: ENDARTERECTOMY CAROTID;  Surgeon: Rosetta Posner, MD;  Location: [facility] OR;  Service: Vascular;  Laterality: Right;  . ESOPHAGOGASTRODUODENOSCOPY (EGD) WITH ESOPHAGEAL DILATION    . HEMORRHOID BANDING    . LEFT HEART CATH AND CORONARY ANGIOGRAPHY N/A 12/26/2017   Procedure: LEFT HEART CATH AND CORONARY ANGIOGRAPHY;  Surgeon: [name], MD;  Location: [place] CV LAB;  Service: Cardiovascular;  Laterality: N/A;  . LITHOTRIPSY  X 3  . LUMBAR LAMINECTOMY    . TRANSURETHRAL RESECTION OF PROSTATE  10/2003    MEDICATIONS: . acetaminophen (TYLENOL) 500 MG tablet  . apixaban (ELIQUIS) 5 MG TABS  tablet  . carvedilol (COREG) 3.125 MG tablet  . Cholecalciferol (VITAMIN D-3) 25 MCG (1000 UT) CAPS  . clobetasol (TEMOVATE) 0.05 % external solution  . dofetilide (TIKOSYN) 125 MCG capsule  . fexofenadine (ALLEGRA) 180 MG tablet  . fluticasone (FLONASE) 50 MCG/ACT nasal spray  . gabapentin (NEURONTIN) 100 MG capsule  . hydrALAZINE (APRESOLINE) 25 MG tablet  . Hypromellose (ARTIFICIAL TEARS OP)  . ipratropium (ATROVENT) 0.06 % nasal spray  . levocetirizine (XYZAL) 5 MG tablet  . nitroGLYCERIN (NITROSTAT) 0.4 MG SL tablet  . rosuvastatin (CRESTOR) 10 MG tablet  . sacubitril-valsartan (ENTRESTO) 97-103 MG  . sodium chloride (OCEAN) 0.65 % SOLN nasal spray  . spironolactone (ALDACTONE) 25 MG tablet  . traMADol (ULTRAM) 50 MG tablet  . triamcinolone cream (KENALOG) 0.1 %   No current facility-administered medications for this encounter.    Konrad Felix, PA-C [facility] Pre-Surgical Testing [phone]

## 2020-05-06 NOTE — Telephone Encounter (Signed)
-----   Message from Larey Dresser, MD sent at 05/06/2020  9:53 AM EST ----- Monitor shows multiple PVCs (23% total).   1. Needs repeat echo and BMET + Mg level 2. Call to make sure no anginal chest pain.  3. Already on Tikosyn so will not start amiodarone right now.   4. Start Coreg 3.125 mg bid.  5. Will need referral to EP for evaluation.  6. Needs office followup with me as well.

## 2020-05-06 NOTE — Telephone Encounter (Signed)
Pt advised and verbalized understanding,denies chest pain,referral placed,echo appt scheduled, echo order entered,new Rx sent into pharmacy, patient is suppose to be admitted into hospital next week will monitor to see if any labs are drawn, if not will draw labs at pending appointment on 12/20. Patient has pending appt with DM in jan.   Meds ordered this encounter  Medications  . carvedilol (COREG) 3.125 MG tablet    Sig: Take 1 tablet (3.125 mg total) by mouth 2 (two) times daily with a meal.    Dispense:  60 tablet    Refill:  11   Orders Placed This Encounter  Procedures  . Ambulatory referral to Cardiac Electrophysiology    Referral Priority:   Routine    Referral Type:   Consultation    Referral Reason:   Specialty Services Required    Requested Specialty:   Cardiology    Number of Visits Requested:   1  . ECHOCARDIOGRAM COMPLETE    Standing Status:   Future    Standing Expiration Date:   05/06/2021    Order Specific Question:   Where should this test be performed    Answer:   [place]    Order Specific Question:   Perflutren DEFINITY (image enhancing agent) should be administered unless hypersensitivity or allergy exist    Answer:   Administer Perflutren    Order Specific Question:   Reason for exam-Echo    Answer:   Congestive Heart Failure  428.0 / I50.9    Order Specific Question:   Release to patient    Answer:   Immediate

## 2020-05-06 NOTE — Addendum Note (Signed)
Addended by: Shela Nevin R on: 40/37/5436 01:55 PM   Modules accepted: Orders

## 2020-05-06 NOTE — Telephone Encounter (Signed)
Hi Dr. [name],  Konrad Felix, one of the PA with anesthesia clinic, reached out to me today about this patient since I am working in pre-op. She saw that Echo and EP referral had been recommended given PVC burden noted on monitor results. He is scheduled to have prostate biopsy next week on 05/10/2020 under general anesthesia. Do you feel like this needs to wait until after Echo is completed and EP has evaluated him or is he OK to proceed?  Please route response back to P [facility] DIV PREOP.  Thank you! Novak Stgermaine

## 2020-05-07 ENCOUNTER — Encounter (HOSPITAL_COMMUNITY): Payer: Self-pay

## 2020-05-08 NOTE — Telephone Encounter (Addendum)
Update: Dr. [name] was off the afternoon of 05/06/2020. However, I was able to speak with Lyda Jester, PA-C, who recently saw the patient and ordered the monitor. Given large PVC burden, she did recommend postponing surgery until after Echo has been done. Echo currently scheduled for 05/16/2020. I updated Konrad Felix, PA-C with anesthesia clinic, who stated she would update surgeon's office.   I will also fax surgeon's office this note notifying them of need to postpone surgery.   Pre-op covering staff, can you also please contact surgeon's office on Monday morning to make sure they are aware?  Thank you!  Darreld Mclean, PA-C 05/08/2020 9:28 AM

## 2020-05-09 NOTE — Telephone Encounter (Signed)
I called and spoke with [facility], they did receive the fax yesterday and they will be reaching out to patient to reschedule surgery.

## 2020-05-10 ENCOUNTER — Telehealth (HOSPITAL_COMMUNITY): Payer: Self-pay

## 2020-05-10 ENCOUNTER — Institutional Professional Consult (permissible substitution): Payer: HMO | Admitting: Cardiology

## 2020-05-10 ENCOUNTER — Telehealth: Payer: Self-pay | Admitting: Internal Medicine

## 2020-05-10 NOTE — Telephone Encounter (Signed)
The patient is trying to reschedule his urologist procedure with Dr. [name]. I let him know that he contacted the wrong office but I would be happy to call them tomorrow and have them reach out to him about the matter.  [facility]: [phone].

## 2020-05-10 NOTE — Progress Notes (Deleted)
Electrophysiology Office Note   Date:  05/10/2020   ID:  Dillon Young, DOB [birth year], MRN [id number]  PCP:  [name], MD  Cardiologist:  [name] Primary Electrophysiologist:  Alie Hardgrove [name], MD    Chief Complaint: PVC   History of Present Illness: Dillon Young is a 86 y.o. male who is being seen today for the evaluation of PVC at the request of Larey Dresser, MD. Presenting today for electrophysiology evaluation.  He has a history significant for coronary artery disease, chronic systolic heart failure, persistent atrial fibrillation.  He had angioplasty in of the circumflex in 1994 1995.  A carotid endarterectomy in June 2013.  Cath August 2017 showed significant RCA stenosis and he was treated with drug-eluting stent.  He had diffuse other coronary disease, but no further stenting was performed as no ischemia was seen on stress testing.  May 2019 he was diagnosed with atrial fibrillation.  Echo showed an ejection fraction of 30 to 35%.  Had a cardioversion but only helped for a few days.  He was mated for dofetilide load.  Today, he denies*** symptoms of palpitations, chest pain, shortness of breath, orthopnea, PND, lower extremity edema, claudication, dizziness, presyncope, syncope, bleeding, or neurologic sequela. The patient is tolerating medications without difficulties.    Past Medical History:  Diagnosis Date  . Adenomatous colon polyp   . Allergic rhinitis   . Anemia   . Anxiety   . BPH (benign prostatic hyperplasia)   . CAD (coronary artery disease)    sees Dr. [name]   . Carotid artery disease ([place])   . CHF (congestive heart failure) ([place])   . Chronic kidney disease    Sones. Prostate cancer  . Clotting disorder ([place])   . CVA (cerebral infarction)   . Diabetes mellitus    "Borderline"  . Diverticulitis   . GERD (gastroesophageal reflux disease)    history  . Headache   . Hemorrhoids   . HTN (hypertension)   . Hyperlipidemia LDL  goal <70   . IBS (irritable bowel syndrome)   . Low back pain   . Pre-diabetes   . Prostate cancer ([place]) 2008   seed implantation  . Sleep apnea    last study was aborted.  Has never worn a CPAP.    Marland Kitchen Smoker    Past Surgical History:  Procedure Laterality Date  . APPENDECTOMY    . CARDIAC CATHETERIZATION N/A 01/16/2016   Procedure: Left Heart Cath and Coronary Angiography;  Surgeon: Jettie Booze, MD;  Location: [place] [facility];  Service: Cardiovascular;  Laterality: N/A;  . CARDIAC CATHETERIZATION N/A 01/16/2016   Procedure: Coronary Stent Intervention;  Surgeon: Jettie Booze, MD;  Location: [place] [facility];  Service: Cardiovascular;  Laterality: N/A;  . CARDIOVERSION N/A 11/26/2017   Procedure: CARDIOVERSION;  Surgeon: [name], MD;  Location: [facility] ENDOSCOPY;  Service: Cardiovascular;  Laterality: N/A;  . CARDIOVERSION N/A 03/13/2018   Procedure: CARDIOVERSION;  Surgeon: [name], MD;  Location: [facility] ENDOSCOPY;  Service: Cardiovascular;  Laterality: N/A;  . CATARACT EXTRACTION W/ INTRAOCULAR LENS  IMPLANT, BILATERAL Bilateral   . COLONOSCOPY  10/21/2008   diverticulosis, radiation proctitis, internal hemorrhoids  . CORONARY STENT INTERVENTION N/A 12/26/2017   Procedure: CORONARY STENT INTERVENTION;  Surgeon: [name], MD;  Location: [place] [facility] LAB;  Service: Cardiovascular;  Laterality: N/A;  . ENDARTERECTOMY  11/12/2011   Procedure: ENDARTERECTOMY CAROTID;  Surgeon: Rosetta Posner, MD;  Location: [facility] OR;  Service: Vascular;  Laterality: Right;  . ESOPHAGOGASTRODUODENOSCOPY (EGD) WITH ESOPHAGEAL DILATION    . HEMORRHOID BANDING    . LEFT HEART CATH AND CORONARY ANGIOGRAPHY N/A 12/26/2017   Procedure: LEFT HEART CATH AND CORONARY ANGIOGRAPHY;  Surgeon: [name], MD;  Location: [place] CV LAB;  Service: Cardiovascular;  Laterality: N/A;  . LITHOTRIPSY  X 3  . LUMBAR LAMINECTOMY    . TRANSURETHRAL RESECTION OF PROSTATE  10/2003      Current Outpatient Medications  Medication Sig Dispense Refill  . acetaminophen (TYLENOL) 500 MG tablet Take 500-1,000 mg by mouth every 6 (six) hours as needed (for headaches).    Marland Kitchen amLODipine (NORVASC) 2.5 MG tablet Take 1 tablet (2.5 mg total) by mouth daily. 30 tablet 11  . apixaban (ELIQUIS) 5 MG TABS tablet Take 5 mg by mouth 2 (two) times daily.    . carvedilol (COREG) 3.125 MG tablet Take 1 tablet (3.125 mg total) by mouth 2 (two) times daily with a meal. 60 tablet 11  . Cholecalciferol (VITAMIN D-3) 25 MCG (1000 UT) CAPS Take 1,000 Units by mouth daily.    . clobetasol (TEMOVATE) 0.05 % external solution Apply 1 application topically See admin instructions. Apply to affected areas of the back twice weekly    . dofetilide (TIKOSYN) 125 MCG capsule TAKE 1 CAPSULE BY MOUTH TWICE DAILY (Patient taking differently: Take 125 mcg by mouth 2 (two) times daily. ) 180 capsule 3  . fexofenadine (ALLEGRA) 180 MG tablet Take 180 mg by mouth daily as needed for allergies or rhinitis.     . fluticasone (FLONASE) 50 MCG/ACT nasal spray Place 2 sprays into both nostrils daily as needed for allergies or rhinitis. 48 g 1  . gabapentin (NEURONTIN) 100 MG capsule Take 1 capsule (100 mg total) by mouth 3 (three) times daily. (Patient taking differently: Take 100 mg by mouth at bedtime. ) 90 capsule 3  . hydrALAZINE (APRESOLINE) 25 MG tablet Take 1 tablet (25 mg total) by mouth 3 (three) times daily. 90 tablet 5  . Hypromellose (ARTIFICIAL TEARS OP) Place 1 drop into both eyes 4 (four) times daily as needed (dry eyes).    Marland Kitchen ipratropium (ATROVENT) 0.06 % nasal spray Place 2 sprays into both nostrils 4 (four) times daily as needed for rhinitis. 45 mL 1  . levocetirizine (XYZAL) 5 MG tablet Take 5 mg by mouth daily as needed for allergies.    . nitroGLYCERIN (NITROSTAT) 0.4 MG SL tablet Place 1 tablet (0.4 mg total) under the tongue every 5 (five) minutes as needed for chest pain. 25 tablet 3  . rosuvastatin  (CRESTOR) 10 MG tablet TAKE 1 TABLET (10 MG TOTAL) BY MOUTH DAILY. NEEDS APPT FOR FUTURE REFILLS 90 tablet 2  . sacubitril-valsartan (ENTRESTO) 97-103 MG Take 1 tablet by mouth 2 (two) times daily. 180 tablet 2  . sodium chloride (OCEAN) 0.65 % SOLN nasal spray Place 1-2 sprays into both nostrils as needed for congestion.    Marland Kitchen spironolactone (ALDACTONE) 25 MG tablet Take 1 tablet (25 mg total) by mouth daily. 90 tablet 3  . traMADol (ULTRAM) 50 MG tablet Take 1 tablet (50 mg total) by mouth every 6 (six) hours as needed. 75 tablet 1  . triamcinolone cream (KENALOG) 0.1 % Apply 1 application topically daily as needed (to affected areas, for itching).      No current facility-administered medications for this visit.    Allergies:   Lipitor [atorvastatin] and Other  Social History:  The patient  reports that he quit smoking about 11 years ago. His smoking use included cigarettes. He has a 90.00 pack-year smoking history. He has never used smokeless tobacco. He reports current alcohol use. He reports that he does not use drugs.   Family History:  The patient's ***family history includes Asthma in his paternal grandmother; Heart disease in his father; Hypertension in his mother.    ROS:  Please see the history of present illness.   Otherwise, review of systems is positive for none.   All other systems are reviewed and negative.    PHYSICAL EXAM: VS:  There were no vitals taken for this visit. , BMI There is no height or weight on file to calculate BMI. GEN: Well nourished, well developed, in no acute distress  HEENT: normal  Neck: no JVD, carotid bruits, or masses Cardiac: ***RRR; no murmurs, rubs, or gallops,no edema  Respiratory:  clear to auscultation bilaterally, normal work of breathing GI: soft, nontender, nondistended, + BS MS: no deformity or atrophy  Skin: warm and dry, ***device pocket is well healed Neuro:  Strength and sensation are intact Psych: euthymic mood, full  affect  EKG:  EKG {ACTION; IS/IS PJK:[id number]} ordered today. Personal review of the ekg ordered *** shows ***  ***Device interrogation is reviewed today in detail.  See PaceArt for details.   Recent Labs: 10/30/2019: Magnesium 2.0 02/05/2020: ALT 6 05/05/2020: BUN 24; Creatinine, Ser 1.18; Hemoglobin 13.6; Platelets 187; Potassium 4.5; Sodium 141    Lipid Panel     Component Value Date/Time   CHOL 113 07/15/2019 0910   CHOL 121 10/02/2017 1011   TRIG 156.0 (H) 07/15/2019 0910   HDL 46.70 07/15/2019 0910   HDL 46 10/02/2017 1011   CHOLHDL 2 07/15/2019 0910   VLDL 31.2 07/15/2019 0910   LDLCALC 35 07/15/2019 0910   LDLCALC 50 10/02/2017 1011     Wt Readings from Last 3 Encounters:  05/05/20 178 lb (80.7 kg)  04/19/20 178 lb 6.4 oz (80.9 kg)  02/23/20 177 lb (80.3 kg)      Other studies Reviewed: Additional studies/ records that were reviewed today include: TTE 11/04/19  Review of the above records today demonstrates:  1. Mild posterolateral hypokinesis. Left ventricular ejection fraction,  by estimation, is 50 to 55%. The left ventricle has low normal function.  The left ventricle demonstrates regional wall motion abnormalities (see  scoring diagram/findings for  description). The left ventricular internal cavity size was mildly  dilated. Left ventricular diastolic parameters are consistent with Grade I  diastolic dysfunction (impaired relaxation). Elevated left ventricular  end-diastolic pressure.  2. Right ventricular systolic function is normal. The right ventricular  size is normal. There is normal pulmonary artery systolic pressure.  3. Left atrial size was severely dilated.  4. The mitral valve is normal in structure. No evidence of mitral valve  regurgitation. No evidence of mitral stenosis.  5. The aortic valve is tricuspid. Aortic valve regurgitation is trivial.  No aortic stenosis is present.  6. The inferior vena cava is normal in size with greater than  50%  respiratory variability, suggesting right atrial pressure of 3 mmHg.   Cardiac monitor 05/06/2020 personally reviewed 1. Primarily NSR 2. 22% PVCs 3. Few short NSVT runs (up to 12 beats).   ASSESSMENT AND PLAN:  1.  ***    Current medicines are reviewed at length with the patient today.   The patient {ACTIONS; HAS/DOES NOT HAVE:19233} concerns regarding his medicines.  The following changes were made today:  {NONE DEFAULTED:18576::"none"}  Labs/ tests ordered today include: *** No orders of the defined types were placed in this encounter.    Disposition:   FU with Romonda Parker {gen number [id number]:[id number]} {Days to years:10300}  Signed, Hiram Mciver [name], MD  05/10/2020 8:12 AM     [facility] [address] [place] [address] [phone] (office) [phone] (fax)

## 2020-05-10 NOTE — Telephone Encounter (Signed)
Patient calling requesting Sherri reschedule his surgery with Dr. [name], because he states it will interfere with his Christmas.

## 2020-05-10 NOTE — Telephone Encounter (Signed)
Patient would like for Dr. Jackalyn Lombard nurse to give him a call. Patient was on [facility] schedule accidentally this morning and taken off. He's an [facility] pt

## 2020-05-10 NOTE — Telephone Encounter (Signed)
Patient called asking to move his prostate procedure till after the holidays. Advised patient to contact [facility] to reschedule procedure.

## 2020-05-11 NOTE — Telephone Encounter (Signed)
Spoke with Pam in scheduling. She will reach out to the patient today.

## 2020-05-16 ENCOUNTER — Ambulatory Visit (HOSPITAL_BASED_OUTPATIENT_CLINIC_OR_DEPARTMENT_OTHER)
Admission: RE | Admit: 2020-05-16 | Discharge: 2020-05-16 | Disposition: A | Discharge status: Home / Self Care | Payer: HMO | Source: Ambulatory Visit

## 2020-05-16 ENCOUNTER — Encounter: Payer: Self-pay | Admitting: Internal Medicine

## 2020-05-16 ENCOUNTER — Telehealth: Payer: Self-pay

## 2020-05-16 ENCOUNTER — Other Ambulatory Visit: Payer: Self-pay

## 2020-05-16 ENCOUNTER — Ambulatory Visit (HOSPITAL_COMMUNITY)
Admission: RE | Admit: 2020-05-16 | Discharge: 2020-05-16 | Disposition: A | Discharge status: Home / Self Care | Payer: HMO | Source: Ambulatory Visit | Attending: Internal Medicine | Admitting: Internal Medicine

## 2020-05-16 ENCOUNTER — Ambulatory Visit: Payer: HMO | Admitting: Internal Medicine

## 2020-05-16 VITALS — BP 144/56 | HR 58 | Wt 180.6 lb

## 2020-05-16 VITALS — BP 128/58 | HR 67 | Ht 69.0 in | Wt 180.4 lb

## 2020-05-16 DIAGNOSIS — D6869 Other thrombophilia: Secondary | ICD-10-CM

## 2020-05-16 DIAGNOSIS — Z955 Presence of coronary angioplasty implant and graft: Secondary | ICD-10-CM | POA: Insufficient documentation

## 2020-05-16 DIAGNOSIS — I130 Hypertensive heart and chronic kidney disease with heart failure and stage 1 through stage 4 chronic kidney disease, or unspecified chronic kidney disease: Secondary | ICD-10-CM | POA: Diagnosis not present

## 2020-05-16 DIAGNOSIS — I2510 Atherosclerotic heart disease of native coronary artery without angina pectoris: Secondary | ICD-10-CM | POA: Diagnosis not present

## 2020-05-16 DIAGNOSIS — I5022 Chronic systolic (congestive) heart failure: Secondary | ICD-10-CM | POA: Diagnosis not present

## 2020-05-16 DIAGNOSIS — E1122 Type 2 diabetes mellitus with diabetic chronic kidney disease: Secondary | ICD-10-CM | POA: Diagnosis not present

## 2020-05-16 DIAGNOSIS — I4819 Other persistent atrial fibrillation: Secondary | ICD-10-CM

## 2020-05-16 DIAGNOSIS — I493 Ventricular premature depolarization: Secondary | ICD-10-CM

## 2020-05-16 DIAGNOSIS — G4730 Sleep apnea, unspecified: Secondary | ICD-10-CM | POA: Insufficient documentation

## 2020-05-16 DIAGNOSIS — I6529 Occlusion and stenosis of unspecified carotid artery: Secondary | ICD-10-CM | POA: Insufficient documentation

## 2020-05-16 DIAGNOSIS — D649 Anemia, unspecified: Secondary | ICD-10-CM | POA: Diagnosis not present

## 2020-05-16 DIAGNOSIS — I080 Rheumatic disorders of both mitral and aortic valves: Secondary | ICD-10-CM | POA: Insufficient documentation

## 2020-05-16 DIAGNOSIS — N189 Chronic kidney disease, unspecified: Secondary | ICD-10-CM | POA: Diagnosis not present

## 2020-05-16 DIAGNOSIS — Z7901 Long term (current) use of anticoagulants: Secondary | ICD-10-CM | POA: Diagnosis not present

## 2020-05-16 DIAGNOSIS — I480 Paroxysmal atrial fibrillation: Secondary | ICD-10-CM | POA: Insufficient documentation

## 2020-05-16 LAB — BASIC METABOLIC PANEL
Anion gap: 11 (ref 5–15)
BUN: 17 mg/dL (ref 8–23)
CO2: 25 mmol/L (ref 22–32)
Calcium: 9 mg/dL (ref 8.9–10.3)
Chloride: 105 mmol/L (ref 98–111)
Creatinine, Ser: 1.06 mg/dL (ref 0.61–1.24)
GFR, Estimated: >60 mL/min (ref >60)
Glucose, Bld: 150 mg/dL — ABNORMAL HIGH (ref 70–99)
Potassium: 3.9 mmol/L (ref 3.5–5.1)
Sodium: 141 mmol/L (ref 135–145)

## 2020-05-16 LAB — ECHOCARDIOGRAM COMPLETE
Area-P 1/2: 3.51 cm2
Calc EF: 47.3 %
S' Lateral: 4 cm
Single Plane A2C EF: 47.9 %
Single Plane A4C EF: 46.8 %

## 2020-05-16 LAB — MAGNESIUM: Magnesium: 2 mg/dL (ref 1.7–2.4)

## 2020-05-16 NOTE — Progress Notes (Signed)
PCP: [name], MD Primary Cardiologist: Dr Marigene Ehlers Primary EP: Dr Rayann Heman  Dillon Young is a 86 y.o. male who presents today for routine electrophysiology followup.  Since last being seen in our clinic, the patient reports doing very well. He is active for his age. Today, he denies symptoms of palpitations, chest pain, shortness of breath,  dizziness, presyncope, or syncope. + minor swelling, no symptoms with PVCs.  The patient is otherwise without complaint today.   Past Medical History:  Diagnosis Date  . Adenomatous colon polyp   . Allergic rhinitis   . Anemia   . Anxiety   . BPH (benign prostatic hyperplasia)   . CAD (coronary artery disease)    sees Dr. [name]   . Carotid artery disease ([place])   . CHF (congestive heart failure) ([place])   . Chronic kidney disease    Sones. Prostate cancer  . Clotting disorder ([place])   . CVA (cerebral infarction)   . Diabetes mellitus    "Borderline"  . Diverticulitis   . GERD (gastroesophageal reflux disease)    history  . Headache   . Hemorrhoids   . HTN (hypertension)   . Hyperlipidemia LDL goal <70   . IBS (irritable bowel syndrome)   . Low back pain   . Pre-diabetes   . Prostate cancer ([place]) 2008   seed implantation  . Sleep apnea    last study was aborted.  Has never worn a CPAP.    Marland Kitchen Smoker    Past Surgical History:  Procedure Laterality Date  . APPENDECTOMY    . CARDIAC CATHETERIZATION N/A 01/16/2016   Procedure: Left Heart Cath and Coronary Angiography;  Surgeon: Jettie Booze, MD;  Location: [place] [facility];  Service: Cardiovascular;  Laterality: N/A;  . CARDIAC CATHETERIZATION N/A 01/16/2016   Procedure: Coronary Stent Intervention;  Surgeon: Jettie Booze, MD;  Location: [place] [facility] LAB;  Service: Cardiovascular;  Laterality: N/A;  . CARDIOVERSION N/A 11/26/2017   Procedure: CARDIOVERSION;  Surgeon: [name], MD;  Location: [facility] ENDOSCOPY;  Service: Cardiovascular;  Laterality: N/A;   . CARDIOVERSION N/A 03/13/2018   Procedure: CARDIOVERSION;  Surgeon: [name], MD;  Location: [facility] ENDOSCOPY;  Service: Cardiovascular;  Laterality: N/A;  . CATARACT EXTRACTION W/ INTRAOCULAR LENS  IMPLANT, BILATERAL Bilateral   . COLONOSCOPY  10/21/2008   diverticulosis, radiation proctitis, internal hemorrhoids  . CORONARY STENT INTERVENTION N/A 12/26/2017   Procedure: CORONARY STENT INTERVENTION;  Surgeon: [name], MD;  Location: [place] CV LAB;  Service: Cardiovascular;  Laterality: N/A;  . ENDARTERECTOMY  11/12/2011   Procedure: ENDARTERECTOMY CAROTID;  Surgeon: Rosetta Posner, MD;  Location: [facility] OR;  Service: Vascular;  Laterality: Right;  . ESOPHAGOGASTRODUODENOSCOPY (EGD) WITH ESOPHAGEAL DILATION    . HEMORRHOID BANDING    . LEFT HEART CATH AND CORONARY ANGIOGRAPHY N/A 12/26/2017   Procedure: LEFT HEART CATH AND CORONARY ANGIOGRAPHY;  Surgeon: [name], MD;  Location: [place] CV LAB;  Service: Cardiovascular;  Laterality: N/A;  . LITHOTRIPSY  X 3  . LUMBAR LAMINECTOMY    . TRANSURETHRAL RESECTION OF PROSTATE  10/2003    ROS- all systems are reviewed and negatives except as per HPI above  Current Outpatient Medications  Medication Sig Dispense Refill  . acetaminophen (TYLENOL) 500 MG tablet Take 500-1,000 mg by mouth every 6 (six) hours as needed (for headaches).    Marland Kitchen amLODipine (NORVASC) 2.5 MG tablet Take 1 tablet (2.5 mg total) by  mouth daily. 30 tablet 11  . apixaban (ELIQUIS) 5 MG TABS tablet Take 5 mg by mouth 2 (two) times daily.    . carvedilol (COREG) 3.125 MG tablet Take 1 tablet (3.125 mg total) by mouth 2 (two) times daily with a meal. 60 tablet 11  . Cholecalciferol (VITAMIN D-3) 25 MCG (1000 UT) CAPS Take 1,000 Units by mouth daily.    . clobetasol (TEMOVATE) 0.05 % external solution Apply 1 application topically See admin instructions. Apply to affected areas of the back twice weekly    . dofetilide (TIKOSYN) 125 MCG capsule TAKE 1 CAPSULE BY  MOUTH TWICE DAILY 180 capsule 3  . fexofenadine (ALLEGRA) 180 MG tablet Take 180 mg by mouth daily as needed for allergies or rhinitis.     . fluticasone (FLONASE) 50 MCG/ACT nasal spray Place 2 sprays into both nostrils daily as needed for allergies or rhinitis. 48 g 1  . gabapentin (NEURONTIN) 100 MG capsule Take 1 capsule (100 mg total) by mouth 3 (three) times daily. 90 capsule 3  . hydrALAZINE (APRESOLINE) 25 MG tablet Take 1 tablet (25 mg total) by mouth 3 (three) times daily. 90 tablet 5  . Hypromellose (ARTIFICIAL TEARS OP) Place 1 drop into both eyes 4 (four) times daily as needed (dry eyes).    Marland Kitchen ipratropium (ATROVENT) 0.06 % nasal spray Place 2 sprays into both nostrils 4 (four) times daily as needed for rhinitis. 45 mL 1  . levocetirizine (XYZAL) 5 MG tablet Take 5 mg by mouth daily as needed for allergies.    . melatonin 5 MG TABS Take 5 mg by mouth at bedtime as needed.    . nitroGLYCERIN (NITROSTAT) 0.4 MG SL tablet Place 1 tablet (0.4 mg total) under the tongue every 5 (five) minutes as needed for chest pain. 25 tablet 3  . rosuvastatin (CRESTOR) 10 MG tablet TAKE 1 TABLET (10 MG TOTAL) BY MOUTH DAILY. NEEDS APPT FOR FUTURE REFILLS 90 tablet 2  . sacubitril-valsartan (ENTRESTO) 97-103 MG Take 1 tablet by mouth 2 (two) times daily. 180 tablet 2  . sodium chloride (OCEAN) 0.65 % SOLN nasal spray Place 1-2 sprays into both nostrils as needed for congestion.    Marland Kitchen spironolactone (ALDACTONE) 25 MG tablet Take 1 tablet (25 mg total) by mouth daily. 90 tablet 3  . traMADol (ULTRAM) 50 MG tablet Take 1 tablet (50 mg total) by mouth every 6 (six) hours as needed. 75 tablet 1  . triamcinolone cream (KENALOG) 0.1 % Apply 1 application topically daily as needed (to affected areas, for itching).     . Wheat Dextrin (BENEFIBER DRINK MIX PO) Take by mouth. Take 2 tablespoons daily     No current facility-administered medications for this visit.    Physical Exam: Vitals:   05/16/20 1444  BP:  (!) 128/58  Pulse: 67  SpO2: 98%  Weight: 180 lb 6.4 oz (81.8 kg)  Height: 5\' 9"  (1.753 m)    GEN- The patient is well appearing, alert and oriented x 3 today.   Head- normocephalic, atraumatic Eyes-  Sclera clear, conjunctiva pink Ears- hearing intact Oropharynx- clear Lungs- Clear to ausculation bilaterally, normal work of breathing Heart- Regular rate and rhythm with frequent ectopy GI- soft, NT, ND, + BS Extremities- no clubbing, cyanosis, or edema  Wt Readings from Last 3 Encounters:  05/16/20 180 lb 6.4 oz (81.8 kg)  05/16/20 180 lb 9.6 oz (81.9 kg)  05/05/20 178 lb (80.7 kg)    EKG tracing ordered today is  personally reviewed and shows  Sinus with frequent PVCs (outflow tract origin likely)  Event monitor- sinus with occasional PACs/ nonsustained atach, frequent PVCs (23% burden)  Assessment and Plan:  1. Persistent atrial fibrillation Doing reasonably well with tikosyn He has severe LA enlargement, He has bmet, mg ordered by CHF clinic which is pending No changes Continue long term anticoagulation  2. CAD/ ischemic CM/ chronic systolic dysfunction No ischemic symptoms Echo from today is pending, though last echo revealed improved EF  3. PVCs Asymptomatic Continue tikosyn Make sure mg and k are replete (ordered today) I would not advise change to amiodarone at this time.  He is not a candidate for ablation Supportive care is advised  4. preop Ok to proceed with urologic procedure if medically indicated.  He is at least moderate risk for any procedures.  I do not feel that additional EP testing or management would reduce his risks.  Risks, benefits and potential toxicities for medications prescribed and/or refilled reviewed with patient today.   Follow-up in AF clinic in 6 months Follow-up with Dr [name] as scheduled I will see as needed  [name] MD, [facility] 05/16/2020 2:58 PM

## 2020-05-16 NOTE — Patient Instructions (Addendum)
Medication Instructions:  Your physician recommends that you continue on your current medications as directed. Please refer to the Current Medication list given to you today.  *If you need a refill on your cardiac medications before your next appointment, please call your pharmacy*  Lab Work: None ordered.  If you have labs (blood work) drawn today and your tests are completely normal, you will receive your results only by: Marland Kitchen MyChart Message (if you have MyChart) OR . A paper copy in the mail If you have any lab test that is abnormal or we need to change your treatment, we will call you to review the results.  Testing/Procedures: None ordered.  Follow-Up: At [facility], you and your health needs are our priority.  As part of our continuing mission to provide you with exceptional heart care, we have created designated Provider Care Teams.  These Care Teams include your primary Cardiologist (physician) and Advanced Practice Providers (APPs -  Physician Assistants and Nurse Practitioners) who all work together to provide you with the care you need, when you need it.  We recommend signing up for the patient portal called "MyChart".  Sign up information is provided on this After Visit Summary.  MyChart is used to connect with patients for Virtual Visits (Telemedicine).  Patients are able to view lab/test results, encounter notes, upcoming appointments, etc.  Non-urgent messages can be sent to your provider as well.   To learn more about what you can do with MyChart, go to [email or web address].    Your next appointment:   Your physician wants you to follow-up in: 6 months in the [facility]. They will contact you to schedule.   Other Instructions:

## 2020-05-16 NOTE — Progress Notes (Signed)
  Echocardiogram 2D Echocardiogram with definity has been performed.  Darlina Sicilian M 05/16/2020, 11:44 AM

## 2020-05-16 NOTE — Telephone Encounter (Signed)
[facility] pt assistance application completed and faxed for eliquis.

## 2020-05-16 NOTE — Progress Notes (Signed)
COVID Vaccine Completed: x2 Date COVID Vaccine completed:  06-07-19 & 06-28-19 COVID vaccine manufacturer: [employer]   PCP - [name], MD Cardiologist - [name], MD  Chest x-ray - 12-31-19 in Epic EKG - 04-19-20 in Epic Stress Test - 01-09-16 in Epic ECHO - 11-04-19 in Epic Cardiac Cath - 12-26-17 in Epic Pacemaker/ICD device last checked: Long Term Monitor - 04-19-20 in Epic Abdominal aortic aneurysm duplex - 10-07-12 in Epic  Sleep Study - Unable to to complete test CPAP - No  Fasting Blood Sugar -  Checks Blood Sugar _____ times a day  Blood Thinner Instructions:  Eliquis 5 mg.  Can hold for 2 days preop per note 04-27-20 Aspirin Instructions: Last Dose:  Anesthesia review: CAD, Afib, L ventricular dysfunction, CHF, PVCs, HTN.  Angioplasty 1994 and 1995  Patient denies shortness of breath, fever, cough and chest pain at PAT appointment.  Pt able to climb a flight of stairs and performs ADLs independently.   Patient verbalized understanding of instructions that were given to them at the PAT appointment. Patient was also instructed that they will need to review over the PAT instructions again at home before surgery.

## 2020-05-16 NOTE — Patient Instructions (Addendum)
It was a pleasure seeing you today!  MEDICATIONS: -No medication changes today -Call if you have questions about your medications.  LABS: -We will call you if your labs need attention.  NEXT APPOINTMENT: Return to clinic in 1 month with Dr. McLean.  In general, to take care of your heart failure: -Limit your fluid intake to 2 Liters (half-gallon) per day.   -Limit your salt intake to ideally 2-3 grams (2000-3000 mg) per day. -Weigh yourself daily and record, and bring that "weight diary" to your next appointment.  (Weight gain of 2-3 pounds in 1 day typically means fluid weight.) -The medications for your heart are to help your heart and help you live longer.   -Please contact us before stopping any of your heart medications.  Call the clinic at [phone] with questions or to reschedule future appointments.  

## 2020-05-17 ENCOUNTER — Telehealth (HOSPITAL_COMMUNITY): Payer: Self-pay

## 2020-05-17 NOTE — Telephone Encounter (Signed)
   Primary Cardiologist: [name], MD  Chart reviewed as part of pre-operative protocol coverage. Given past medical history and time since last visit, based on ACC/AHA guidelines, Dillon Young would be at acceptable risk for the planned procedure without further cardiovascular testing.   Patient with diagnosis of ATRIAL FIBRILLATION on ELIQUIS for anticoagulation.    Procedure: PROSTATE BIOPSY Date of procedure: 05/10/2020    CHA2DS2-VASc Score = 5  This indicates a 7.2% annual risk of stroke. The patient's score is based upon: CHF History: 1 HTN History: 1 Diabetes History: 0 Stroke History: 0 Vascular Disease History: 1 Age Score: 2 Gender Score: 0    CrCl = 50 MG/ML Platelet count = 180  Per office protocol, patient can hold ELIQUIS for 2 days prior to procedure  I will route this recommendation to the requesting party via [place] fax function and remove from pre-op pool.  Please call with questions.  Jossie Ng. Meher Kucinski NP-C    05/17/2020, 7:12 AM [address] Office [phone] Fax [phone]

## 2020-05-17 NOTE — Telephone Encounter (Signed)
Malena Edman, RN  05/17/2020 2:40 PM EST Back to Top     Patient advised and verbalized understanding

## 2020-05-17 NOTE — Telephone Encounter (Signed)
-----   Message from Larey Dresser, MD sent at 05/16/2020  5:53 PM EST ----- EF is low normal, 50-55%

## 2020-05-23 ENCOUNTER — Encounter (HOSPITAL_COMMUNITY): Payer: Self-pay | Admitting: Urology

## 2020-05-23 ENCOUNTER — Other Ambulatory Visit: Payer: Self-pay

## 2020-05-23 NOTE — Progress Notes (Signed)
Anesthesia Review:  PCP: Cardiologist : DR  Hillis Range- [place]- 05/16/2020 Chest x-ray :12/31/2019  EKG :04/19/20  Echo :05/16/2020  Stress test: Cardiac Cath :  Activity level:  Sleep Study/ CPAP : no  Fasting Blood Sugar :      / Checks Blood Sugar -- times a day:   Blood Thinner/ Instructions /Last Dose: ASA / Instructions/ Last Dose :  Borderline diabetes per pt - hgba1c- 6.2 on 05/05/2020  Patient states he has not been given any preprocedure instructions regardin gEliqiuis prior to procedure. Instrsucted pt to call DR [facility] office at [phone] and ask to speak to Surgery Scheduler.  Pt voiced understanding.

## 2020-05-24 NOTE — Progress Notes (Signed)
Pt called in regards to what meds to take am of proceure..  Reviewed medicaitons with pt and what times of day pt takes meds.  Instructed pt to take Coreg, Hydralazine, Tikosyn and Amlodipine am of surgery just like he usually takes in the am.  Pt voiced understanding.

## 2020-05-25 NOTE — Progress Notes (Signed)
Anesthesia Chart Review  Choose an anesthesia record to view details       DISCUSSION:87 y.o. former smoker with h/o GERD, HTN, DM, CKD, sleep apnea, CHF, CAD, prostate lesion scheduled for above procedure 05/31/2020 with Dr. Irine Seal.   Previously scheduled 05/10/20, rescheduled due to pending Echo and need for cardiology follow up.   Echo 05/16/2020 with EF 50-55%  Pt last seen by cardiology 05/16/2020. Per OV note, "Ok to proceed with urologic procedure if medically indicated.  He is at least moderate risk for any procedures.  I do not feel that additional EP testing or management would reduce his risks."  Anticipate pt can proceed with planned procedure barring acute status change.   VS: There were no vitals taken for this visit.  PROVIDERS: [name], MD is PCP   [name], MD is Cardiologist  LABS: Labs reviewed: Acceptable for surgery. (all labs ordered are listed, but only abnormal results are displayed)  Labs Reviewed - No data to display   IMAGES:   EKG: 04/19/2020 Rate 78 bpm  Sinus rhythm with 1st degree A-V block with frequent Premature ventricular complexes in a pattern of bigeminy Left anterior fascicular block Abnormal ECG Premature ventricular complexes New since previous tracing  CV: Echo 05/16/2020 IMPRESSIONS    1. Left ventricular ejection fraction, by estimation, is 50 to 55%. The  left ventricle has low normal function. The left ventricle demonstrates  regional wall motion abnormalities (see scoring diagram/findings for  description). There is mild hypokinesis  of the posterolateral LV wall.There is mild concentric left ventricular  hypertrophy. Diastolic function indeterminant due to arrhythmia.  2. Right ventricular systolic function is normal. The right ventricular  size is normal.  3. Left atrial size was moderately dilated.  4. Right atrial size was mildly dilated.  5. The mitral valve is grossly normal. Mild mitral valve  regurgitation.  6. The aortic valve is tricuspid. There is mild calcification of the  aortic valve. There is mild thickening of the aortic valve. Aortic valve  regurgitation is not visualized. Mild aortic valve sclerosis is present,  with no evidence of aortic valve  stenosis.  7. The inferior vena cava is normal in size with greater than 50%  respiratory variability, suggesting right atrial pressure of 3 mmHg.   Comparison(s): Compared to prior echo in 10/2019, there is no significant  change.   Cardiac Cath 12/26/2017   Previously placed Prox RCA to Mid RCA stent (unknown type) is widely patent.  Prox LAD to Mid LAD lesion is 80% stenosed.  A stent was successfully placed.  Post intervention, there is a 0% residual stenosis.  There is severe left ventricular systolic dysfunction.  LV end diastolic pressure is mildly elevated.  The left ventricular ejection fraction is less than 25% by visual estimate.  Past Medical History:  Diagnosis Date  . Adenomatous colon polyp   . Allergic rhinitis   . Anemia   . Anxiety   . BPH (benign prostatic hyperplasia)   . CAD (coronary artery disease)    sees Dr. [name]   . Carotid artery disease ([place])   . CHF (congestive heart failure) ([place])   . Chronic kidney disease    S[place]. Prostate cancer  . Clotting disorder ([place])   . CVA (cerebral infarction)   . Diabetes mellitus    "Borderline"  . Diverticulitis   . Dysrhythmia    atrial fib - hx of   . Edema    recent swelling in  feet and ankles   . GERD (gastroesophageal reflux disease)    history  . Headache   . Hemorrhoids   . History of kidney stones   . HTN (hypertension)   . Hx of radiation therapy    for prostate - 2013   . Hyperlipidemia LDL goal <70   . IBS (irritable bowel syndrome)   . Low back pain   . Pre-diabetes   . Prostate cancer (HCC) 2008   seed implantation  . Sleep apnea    sitting in recliner helps him to breathe per pt , never used cpap sleep  study - pt reports could not go to sleep- 6 years ago   . Smoker   . Stroke [facility])    hx of mini strokes years ago     Past Surgical History:  Procedure Laterality Date  . APPENDECTOMY    . CARDIAC CATHETERIZATION N/A 01/16/2016   Procedure: Left Heart Cath and Coronary Angiography;  Surgeon: Corky Crafts, MD;  Location: [facility] INVASIVE CV LAB;  Service: Cardiovascular;  Laterality: N/A;  . CARDIAC CATHETERIZATION N/A 01/16/2016   Procedure: Coronary Stent Intervention;  Surgeon: Corky Crafts, MD;  Location: [facility] INVASIVE CV LAB;  Service: Cardiovascular;  Laterality: N/A;  . CARDIOVERSION N/A 11/26/2017   Procedure: CARDIOVERSION;  Surgeon: Chrystie Nose, MD;  Location: [facility] ENDOSCOPY;  Service: Cardiovascular;  Laterality: N/A;  . CARDIOVERSION N/A 03/13/2018   Procedure: CARDIOVERSION;  Surgeon: Chrystie Nose, MD;  Location: [facility] ENDOSCOPY;  Service: Cardiovascular;  Laterality: N/A;  . CATARACT EXTRACTION W/ INTRAOCULAR LENS  IMPLANT, BILATERAL Bilateral   . COLONOSCOPY  10/21/2008   diverticulosis, radiation proctitis, internal hemorrhoids  . CORONARY ANGIOPLASTY    . CORONARY STENT INTERVENTION N/A 12/26/2017   Procedure: CORONARY STENT INTERVENTION;  Surgeon: Runell Gess, MD;  Location: [facility] INVASIVE CV LAB;  Service: Cardiovascular;  Laterality: N/A;  . ENDARTERECTOMY  11/12/2011   Procedure: ENDARTERECTOMY CAROTID;  Surgeon: Larina Earthly, MD;  Location: [facility] OR;  Service: Vascular;  Laterality: Right;  . ESOPHAGOGASTRODUODENOSCOPY (EGD) WITH ESOPHAGEAL DILATION    . HEMORRHOID BANDING    . LEFT HEART CATH AND CORONARY ANGIOGRAPHY N/A 12/26/2017   Procedure: LEFT HEART CATH AND CORONARY ANGIOGRAPHY;  Surgeon: Runell Gess, MD;  Location: [facility] INVASIVE CV LAB;  Service: Cardiovascular;  Laterality: N/A;  . LITHOTRIPSY  X 3  . LUMBAR LAMINECTOMY    . TRANSURETHRAL RESECTION OF PROSTATE  10/2003    MEDICATIONS: No current facility-administered medications for this  encounter.   Marland Kitchen acetaminophen (TYLENOL) 500 MG tablet  . apixaban (ELIQUIS) 5 MG TABS tablet  . Cholecalciferol (VITAMIN D-3) 25 MCG (1000 UT) CAPS  . clobetasol (TEMOVATE) 0.05 % external solution  . dofetilide (TIKOSYN) 125 MCG capsule  . fexofenadine (ALLEGRA) 180 MG tablet  . fluticasone (FLONASE) 50 MCG/ACT nasal spray  . gabapentin (NEURONTIN) 100 MG capsule  . hydrALAZINE (APRESOLINE) 25 MG tablet  . Hypromellose (ARTIFICIAL TEARS OP)  . ipratropium (ATROVENT) 0.06 % nasal spray  . levocetirizine (XYZAL) 5 MG tablet  . nitroGLYCERIN (NITROSTAT) 0.4 MG SL tablet  . rosuvastatin (CRESTOR) 10 MG tablet  . sacubitril-valsartan (ENTRESTO) 97-103 MG  . sodium chloride (OCEAN) 0.65 % SOLN nasal spray  . spironolactone (ALDACTONE) 25 MG tablet  . traMADol (ULTRAM) 50 MG tablet  . triamcinolone cream (KENALOG) 0.1 %  . amLODipine (NORVASC) 2.5 MG tablet  . carvedilol (COREG) 3.125 MG tablet  . melatonin 5 MG TABS  . Wheat  Dextrin (BENEFIBER DRINK MIX PO)   Konrad Felix, PA-C [facility] Pre-Surgical Testing [phone]

## 2020-05-30 ENCOUNTER — Other Ambulatory Visit (HOSPITAL_COMMUNITY)
Admission: RE | Admit: 2020-05-30 | Discharge: 2020-05-30 | Disposition: A | Discharge status: Home / Self Care | Payer: HMO | Source: Ambulatory Visit | Attending: Urology | Admitting: Urology

## 2020-05-30 DIAGNOSIS — Z20822 Contact with and (suspected) exposure to covid-19: Secondary | ICD-10-CM | POA: Diagnosis not present

## 2020-05-30 DIAGNOSIS — Z01812 Encounter for preprocedural laboratory examination: Secondary | ICD-10-CM | POA: Insufficient documentation

## 2020-05-30 NOTE — H&P (Signed)
I have blood in my urine.  HPI: Dillon Young is a 87 year-old male established patient who is here for blood in the urine.  He did see the blood in his urine. He first noticed the symptoms approximately 07/07/2018. He has not seen blood clots.   He does not have a burning sensation when he urinates. He is currently having trouble urinating.   He is not having pain. He has not recently had unwanted weight loss.   His last U/S or CT Scan was 12/31/2019.   07/14/2018: Dillon Young returns today with the complaint of recurrent hematuria. He remains on plavix and the Eliquis for cardiac issues and has a history of radiation cystitis following a seed implant which was done remotely. He has no difficulty voiding but he has had clots. He has no dysuria. He feels like he is emptying but he has some urgency. He has been doing some exercise. He had a prostatic urethral stone on cystoscopy in 4/19 and he has a known 47mm LLP stone.    07/29/2018: After last visit, His cardiologist gave permission to stop his Plavix completely and hold his Eliquis for a few days to see if that would help with gross hematuria clearing. Pt. f/u today for repeat renal u/s.   He reports never stopping Eliquis but did discontinue Plavix. Hematuria had stopped for about 4 days last week until returning from a [name] trip and having to strain to defecate approximately 2 days ago due to constipation when he noted intermittent recurrence of blood in his stream. It does not occur every time he voids and is usually at the beginning of his stream. Not associated with any painful or burning urination. Denies any unilateral flank and lower back pain. Denies fevers or chills, nausea/vomiting.   02/05/2020: Patient returns today for evaluation of hematuria. This began last week in after doing a lot of yd work including trimming of head is in his back yd. He reports doing a lot of strenuous activity in relation to this. He complains of hematuria  with each void typically noted at the beginning of his urine stream but clearing at the end of voiding. Denies passage of clot material. Not associated with burning or painful urination. No increased ability to start or stop his stream. He has had some increase in frequency as well as urgency and nocturia from baseline since the onset of symptoms. PVR today is a little over 2 oz with symptom score sheet of 16. He saw primary care yesterday who recommended follow-up with urology. He had recent CT imaging done last month for some left lower quadrant abdominal pain. This noted a stable cm sized left-sided stone as well as simple appearing renal cyst. No other concerning GU mass or lesion was identified.   03/15/20: Dillon Young returns today in f/u for his history of gross hematuria. He has had a seed implant and radiation cystitis with recurrent bleeding on Eliquis. He has had his dose reduced to 2.5mg  and has reduced bleeding with the dosing change. He has a history of stones and had a 1cm stone in the LLP on CT in 8/21. He had renal cysts but an obvious source of bleeding was not seen. He had cystoscopy for bleeding in 2019. He has moderate LUTS with nocturia x 4 with small voids and an IPSS of 14. He has coffee in the morning. He has no GI complaints. His PSA was 0.0 in 2/21. His Cr was 1.14 in 9/21. UA today has  3-10 RBC's.   04/25/20: Dillon Young returns today for cystoscopy. He has intermittent hematuria without clots. His IPSS is 8. His UA has >60 RBC's today.     ALLERGIES: No Known Drug Allergies    MEDICATIONS: Amlodipine Besylate  Cholecalciferol  Clobetasol Propionate  Dofetilide  Eliquis 5 mg tablet  Fexofenadine Hcl  Fluticasone Propionate  Ipratropium Bromide  Nitroglycerin  Rosuvastatin Calcium  Spironolactone  Triamcinolone  Valsartan  Vitamin C  Vitamin D2     Notes: Pt forgot his medication list today. Denies changes from previous assessment.   GU PSH: Cystoscopy -  2019 Cystoscopy Ureteroscopy - 2007 ESWL - 2007 Locm 300-399Mg /Ml Iodine,1Ml - 2019 [place], PROS - 2009       PSH Notes: Colonoscopy (Fiberoptic), Surgery Prostate Transperineal Placement Of Needles, Cystoscopy With Ureteroscopy, Lithotripsy, Cath Stent Placement, Back Surgery   NON-GU PSH: Cardiac Stent Placement Coronary Artery Bypass Grafting - 2017 Diagnostic Colonoscopy - 2017     GU PMH: Gross hematuria, He continues to have gross hematuria and may have had some improvement with a reduction in the Eliquis dosage. I am going to have him return for cystoscopy to reassess the degree of radiation cystitis and to r/o tumors. - 03/15/2020, - 02/05/2020, Stable renal calculi, bilateral renal cysts without obstrutive signs on u/s today. The bladder was not visualized., - 2020 (Worsening), He has bleeding that is probably from the radiation cystitis and is facilitated by Eliquis and Plavix. I will have stop the plavix per a communciation with Dr. Marigene Ehlers and he will hold the Eliquis for a few days. He will return in 2 weeks. If he has persistent bleeding, he will need cystoscopy and fulguration and possible hyperbaric O2 therapy., - 2020 (Improving), - 2019, I am going to get him set up for CT hematuria study and cystoscopy. I will get a BUN/Cr today. If the bleeding becomes heavy, I will have him hold the plavix. The bleeding could be from his stone or radiation cystitis but I need to r/o neoplasm as well. , - 2019, Gross hematuria, - 2015, Gross Hematuria, - 2014 History of prostate cancer, His PSA remained undetectible earlier this year. - 03/15/2020, History of prostate cancer, - 2017 Nocturia, He has nocturia x 4. I recommended cutting out coffee and bladder irritants and if that doesn't work we can consider medications. - 03/15/2020, (Stable), - 02/05/2020 (Stable), - 2018, Nocturia, - 2017 Radiation cystitis (with hematuria) - 03/15/2020, The bleeding is most likely from the  radiation change in the prostate with the associated stone in the prostatic urethral. He passed that after cystoscopy., - 2019 Renal calculus, He has a left renal stone but no symptoms. - 03/15/2020, - 02/05/2020, Nephrolithiasis, - 2017 Renal cyst - 02/05/2020, Renal cysts, acquired, bilateral, - 2014 Bladder Stone, 56mm in prostatic urethra. - 2019 Prostate Cancer - 2018 LLQ pain, Abdominal pain, LLQ (left lower quadrant) - 2017 ED due to arterial insufficiency, Erectile dysfunction due to arterial insufficiency - 2016 Elevated PSA, PSA,Elevated - 2014 Urinary Frequency, Urinary frequency - 2014 Urinary Retention, Unspec, Acute Urinary Retention - 2014      PMH Notes:  2006-05-24 16:43:03 - Note: Coronary Artery Disease  2012-07-24 13:53:37 - Note: Bladder Calculus   NON-GU PMH: Encounter for general adult medical examination without abnormal findings, Encounter for preventive health examination - 2017 Nausea, Nausea - 2017 Personal history of other diseases of the digestive system, History of diverticulitis of colon - 2017 Personal history of other diseases of the circulatory  system, History of cardiac disorder - 2014, History of hypertension, - 2014 Personal history of other endocrine, nutritional and metabolic disease, History of hypercholesterolemia - 2014 Atrial Fibrillation    FAMILY HISTORY: Family Health Status Number - Father Urologic Disorder - Father   SOCIAL HISTORY: Marital Status: Married Preferred Language: English; Ethnicity: Not Hispanic Or Latino; Race: White Current Smoking Status: Patient does not smoke anymore. Has not smoked since 09/13/2008.   Tobacco Use Assessment Completed: Used Tobacco in last 30 days? Social Drinker.  Drinks 1 caffeinated drink per day.     Notes: Former smoker, Tobacco Use, Marital History - Currently Married, Caffeine Use, Death In The Family Mother, Death In The Family Father, Occupation:   REVIEW OF SYSTEMS:    GU Review Male:    hematuria. Patient denies frequent urination, hard to postpone urination, burning/ pain with urination, get up at night to urinate, leakage of urine, stream starts and stops, trouble starting your stream, have to strain to urinate , erection problems, and penile pain.  Gastrointestinal (Upper):   Patient denies indigestion/ heartburn, nausea, and vomiting.  Gastrointestinal (Lower):   Patient denies diarrhea and constipation.  Constitutional:   Patient denies fever, night sweats, weight loss, and fatigue.  Skin:   Patient denies skin rash/ lesion and itching.  Eyes:   Patient denies blurred vision and double vision.  Ears/ Nose/ Throat:   Patient denies sore throat and sinus problems.  Hematologic/Lymphatic:   Patient denies swollen glands and easy bruising.  Cardiovascular:   Patient denies leg swelling and chest pains.  Respiratory:   Patient denies cough and shortness of breath.  Endocrine:   Patient denies excessive thirst.  Musculoskeletal:   Patient denies back pain and joint pain.  Neurological:   Patient denies headaches and dizziness.  Psychologic:   Patient denies depression and anxiety.   VITAL SIGNS:      04/25/2020 09:08 AM  BP 149/63 mmHg  Pulse 94 /min  Temperature 98.7 F / 37.0 C   MULTI-SYSTEM PHYSICAL EXAMINATION:    Constitutional: Well-nourished. No physical deformities. Normally developed. Good grooming.  Respiratory: Normal breath sounds. No labored breathing, no use of accessory muscles.   Cardiovascular: Regular rate and rhythm. No murmur, no gallop.      Complexity of Data:  Records Review:   AUA Symptom Score, Previous Patient Records  Urine Test Review:   Urinalysis   09/09/17 09/04/16 08/27/15 08/17/14 07/15/13 06/25/12 07/18/11 03/03/10  PSA  Total PSA <0.015 ng/mL < 0.015 ng/dl <0.01  0.01  0.02  0.03  0.03  0.16     PROCEDURES:         Flexible Cystoscopy - 52000  Risks, benefits, and some of the potential complications of the procedure were  discussed. 84ml of 2% lidocaine jelly was instilled intraurethrally.  Cipro 500mg  given for antibiotic prophylaxis.     Meatus:  Normal size. Normal location. Normal condition.  Urethra:  No strictures.  External Sphincter:  Normal.  Verumontanum:  Normal.  Prostate:  Borderline obstructing. No hyperplasia. there are radiation changes with an irregular friable lesion of the mid anterior prostate which is concerning for neoplasm.   Bladder Neck:  Non-obstructing.  Ureteral Orifices:  Normal location. Normal size. Normal shape. Effluxed clear urine.  Bladder:  Mild trabeculation. No tumors. Normal mucosa except for radiation changes on the left lateral wall near the bladder neck.. No stones.       The procedure was well tolerated and there were no complications.  Urinalysis w/Scope Dipstick Dipstick Cont'd Micro  Color: Yellow Bilirubin: Neg mg/dL WBC/hpf: 0 - 5/hpf  Appearance: Cloudy Ketones: Neg mg/dL RBC/hpf: >60/hpf  Specific Gravity: 1.025 Blood: 3+ ery/uL Bacteria: Few (10-25/hpf)  pH: 6.0 Protein: Trace mg/dL Cystals: NS (Not Seen)  Glucose: Neg mg/dL Urobilinogen: 0.2 mg/dL Casts: NS (Not Seen)    Nitrites: Neg Trichomonas: Not Present    Leukocyte Esterase: Neg leu/uL Mucous: Not Present      Epithelial Cells: NS (Not Seen)      Yeast: NS (Not Seen)      Sperm: Not Present    ASSESSMENT:      ICD-10 Details  1 GU:   Gross hematuria - R31.0 Chronic, Worsening - He has chronic intermittent hematuria and now has a lesion on the anterior prostate that is of concern for a neoplasm. I will get him set up for a TUR biopsy and fulguration. Risks of bleeding, infection, incontinence, strictures, thrombotic events and anesthetic complications reviewed. Cytology sent today.   2   Prostate, Neoplasm of uncertain behavior - 123XX123 Undiagnosed New Problem   PLAN:           Orders Labs Urine Cytology          Schedule Return Visit/Planned Activity: Next Available Appointment  - Schedule Surgery             Note: TUR prostate biopsy.           Document Letter(s):  Created for Patient: Clinical Summary         Notes:   He will need to be cleared to come off of Eliquis for the procedure.

## 2020-05-31 ENCOUNTER — Ambulatory Visit (HOSPITAL_COMMUNITY)
Admission: RE | Admit: 2020-05-31 | Discharge: 2020-05-31 | Disposition: A | Discharge status: Home / Self Care | Payer: HMO | Attending: Urology | Admitting: Urology

## 2020-05-31 ENCOUNTER — Ambulatory Visit (HOSPITAL_COMMUNITY): Payer: HMO | Admitting: Physician Assistant

## 2020-05-31 ENCOUNTER — Encounter (HOSPITAL_COMMUNITY): Payer: Self-pay | Admitting: Urology

## 2020-05-31 ENCOUNTER — Encounter (HOSPITAL_COMMUNITY)
Admission: RE | Disposition: A | Discharge status: Home / Self Care | Payer: Self-pay | Source: Home / Self Care | Attending: Urology

## 2020-05-31 DIAGNOSIS — Z87891 Personal history of nicotine dependence: Secondary | ICD-10-CM | POA: Insufficient documentation

## 2020-05-31 DIAGNOSIS — E1122 Type 2 diabetes mellitus with diabetic chronic kidney disease: Secondary | ICD-10-CM | POA: Diagnosis not present

## 2020-05-31 DIAGNOSIS — Z8673 Personal history of transient ischemic attack (TIA), and cerebral infarction without residual deficits: Secondary | ICD-10-CM | POA: Diagnosis not present

## 2020-05-31 DIAGNOSIS — N189 Chronic kidney disease, unspecified: Secondary | ICD-10-CM | POA: Insufficient documentation

## 2020-05-31 DIAGNOSIS — Z955 Presence of coronary angioplasty implant and graft: Secondary | ICD-10-CM | POA: Insufficient documentation

## 2020-05-31 DIAGNOSIS — N368 Other specified disorders of urethra: Secondary | ICD-10-CM | POA: Diagnosis not present

## 2020-05-31 DIAGNOSIS — G4730 Sleep apnea, unspecified: Secondary | ICD-10-CM | POA: Diagnosis not present

## 2020-05-31 DIAGNOSIS — C680 Malignant neoplasm of urethra: Secondary | ICD-10-CM | POA: Insufficient documentation

## 2020-05-31 DIAGNOSIS — Z79899 Other long term (current) drug therapy: Secondary | ICD-10-CM | POA: Diagnosis not present

## 2020-05-31 DIAGNOSIS — I130 Hypertensive heart and chronic kidney disease with heart failure and stage 1 through stage 4 chronic kidney disease, or unspecified chronic kidney disease: Secondary | ICD-10-CM | POA: Diagnosis not present

## 2020-05-31 DIAGNOSIS — Z7901 Long term (current) use of anticoagulants: Secondary | ICD-10-CM | POA: Insufficient documentation

## 2020-05-31 DIAGNOSIS — I509 Heart failure, unspecified: Secondary | ICD-10-CM | POA: Diagnosis not present

## 2020-05-31 DIAGNOSIS — Z951 Presence of aortocoronary bypass graft: Secondary | ICD-10-CM | POA: Diagnosis not present

## 2020-05-31 DIAGNOSIS — R310 Gross hematuria: Secondary | ICD-10-CM | POA: Diagnosis not present

## 2020-05-31 DIAGNOSIS — Z923 Personal history of irradiation: Secondary | ICD-10-CM | POA: Insufficient documentation

## 2020-05-31 DIAGNOSIS — I110 Hypertensive heart disease with heart failure: Secondary | ICD-10-CM | POA: Diagnosis not present

## 2020-05-31 DIAGNOSIS — D419 Neoplasm of uncertain behavior of unspecified urinary organ: Secondary | ICD-10-CM | POA: Diagnosis not present

## 2020-05-31 DIAGNOSIS — Z8546 Personal history of malignant neoplasm of prostate: Secondary | ICD-10-CM | POA: Diagnosis not present

## 2020-05-31 DIAGNOSIS — I2510 Atherosclerotic heart disease of native coronary artery without angina pectoris: Secondary | ICD-10-CM | POA: Diagnosis not present

## 2020-05-31 HISTORY — PX: TRANSURETHRAL RESECTION OF PROSTATE: SHX73

## 2020-05-31 HISTORY — DX: Personal history of urinary calculi: Z87.442

## 2020-05-31 HISTORY — DX: Cerebral infarction, unspecified: I63.9

## 2020-05-31 HISTORY — DX: Edema, unspecified: R60.9

## 2020-05-31 HISTORY — DX: Personal history of irradiation: Z92.3

## 2020-05-31 HISTORY — DX: Cardiac arrhythmia, unspecified: I49.9

## 2020-05-31 LAB — CBC
HCT: 41.9 % (ref 39.0–52.0)
Hemoglobin: 13.5 g/dL (ref 13.0–17.0)
MCH: 27.6 pg (ref 26.0–34.0)
MCHC: 32.2 g/dL (ref 30.0–36.0)
MCV: 85.5 fL (ref 80.0–100.0)
Platelets: 164 10*3/uL (ref 150–400)
RBC: 4.9 MIL/uL (ref 4.22–5.81)
RDW: 14.6 % (ref 11.5–15.5)
WBC: 5.6 10*3/uL (ref 4.0–10.5)
nRBC: 0 % (ref 0.0–0.2)

## 2020-05-31 LAB — BASIC METABOLIC PANEL
Anion gap: 10 (ref 5–15)
BUN: 20 mg/dL (ref 8–23)
CO2: 27 mmol/L (ref 22–32)
Calcium: 9 mg/dL (ref 8.9–10.3)
Chloride: 105 mmol/L (ref 98–111)
Creatinine, Ser: 1.09 mg/dL (ref 0.61–1.24)
GFR, Estimated: >60 mL/min (ref >60)
Glucose, Bld: 118 mg/dL — ABNORMAL HIGH (ref 70–99)
Potassium: 4 mmol/L (ref 3.5–5.1)
Sodium: 142 mmol/L (ref 135–145)

## 2020-05-31 LAB — SARS CORONAVIRUS 2 (TAT 6-24 HRS): SARS Coronavirus 2: NEGATIVE

## 2020-05-31 SURGERY — TURP (TRANSURETHRAL RESECTION OF PROSTATE)
Anesthesia: General

## 2020-05-31 MED ORDER — LIDOCAINE HCL (PF) 2 % IJ SOLN
INTRAMUSCULAR | Status: AC
  Filled 2020-05-31: qty 5

## 2020-05-31 MED ORDER — LIDOCAINE HCL (CARDIAC) PF 50 MG/5ML IV SOSY
PREFILLED_SYRINGE | INTRAVENOUS | Status: DC | PRN
  Administered 2020-05-31: 50 mg via INTRAVENOUS

## 2020-05-31 MED ORDER — CEFAZOLIN SODIUM-DEXTROSE 2-4 GM/100ML-% IV SOLN
2.0000 g | INTRAVENOUS | Status: AC
  Administered 2020-05-31: 2 g via INTRAVENOUS
  Filled 2020-05-31: qty 100

## 2020-05-31 MED ORDER — ACETAMINOPHEN 10 MG/ML IV SOLN
1000.0000 mg | Freq: Once | INTRAVENOUS | Status: DC | PRN
  Administered 2020-05-31: 1000 mg via INTRAVENOUS

## 2020-05-31 MED ORDER — OXYCODONE HCL 5 MG PO TABS
ORAL_TABLET | ORAL | Status: AC
  Filled 2020-05-31: qty 1

## 2020-05-31 MED ORDER — ORAL CARE MOUTH RINSE: 15.0000 mL | Freq: Once | OROMUCOSAL | Status: AC

## 2020-05-31 MED ORDER — DEXAMETHASONE SODIUM PHOSPHATE 10 MG/ML IJ SOLN: INTRAMUSCULAR | Status: DC | PRN

## 2020-05-31 MED ORDER — CHLORHEXIDINE GLUCONATE 0.12 % MT SOLN
15.0000 mL | Freq: Once | OROMUCOSAL | Status: AC
  Administered 2020-05-31: 15 mL via OROMUCOSAL

## 2020-05-31 MED ORDER — ONDANSETRON HCL 4 MG/2ML IJ SOLN
INTRAMUSCULAR | Status: AC
  Filled 2020-05-31: qty 2

## 2020-05-31 MED ORDER — DEXAMETHASONE SODIUM PHOSPHATE 10 MG/ML IJ SOLN
INTRAMUSCULAR | Status: DC | PRN
  Administered 2020-05-31: 10 mg via INTRAVENOUS

## 2020-05-31 MED ORDER — FENTANYL CITRATE (PF) 100 MCG/2ML IJ SOLN: 25.0000 ug | INTRAMUSCULAR | Status: DC | PRN

## 2020-05-31 MED ORDER — PROPOFOL 10 MG/ML IV BOLUS
INTRAVENOUS | Status: AC
  Filled 2020-05-31: qty 20

## 2020-05-31 MED ORDER — FENTANYL CITRATE (PF) 100 MCG/2ML IJ SOLN
INTRAMUSCULAR | Status: AC
  Filled 2020-05-31: qty 2

## 2020-05-31 MED ORDER — FENTANYL CITRATE (PF) 250 MCG/5ML IJ SOLN
INTRAMUSCULAR | Status: AC
  Filled 2020-05-31: qty 5

## 2020-05-31 MED ORDER — PHENYLEPHRINE HCL-NACL 10-0.9 MG/250ML-% IV SOLN
INTRAVENOUS | Status: DC | PRN
  Administered 2020-05-31: 25 ug/min via INTRAVENOUS

## 2020-05-31 MED ORDER — MIDAZOLAM HCL 2 MG/2ML IJ SOLN
INTRAMUSCULAR | Status: AC
  Filled 2020-05-31: qty 2

## 2020-05-31 MED ORDER — SODIUM CHLORIDE 0.9 % IV SOLN: 250.0000 mL | INTRAVENOUS | Status: DC | PRN

## 2020-05-31 MED ORDER — OXYCODONE HCL 5 MG PO TABS
5.0000 mg | ORAL_TABLET | ORAL | Status: DC | PRN
  Administered 2020-05-31: 5 mg via ORAL

## 2020-05-31 MED ORDER — LACTATED RINGERS IV SOLN: INTRAVENOUS | Status: DC

## 2020-05-31 MED ORDER — MORPHINE SULFATE (PF) 4 MG/ML IV SOLN: 1.0000 mg | INTRAVENOUS | Status: DC | PRN

## 2020-05-31 MED ORDER — ACETAMINOPHEN 650 MG RE SUPP
650.0000 mg | RECTAL | Status: DC | PRN
  Filled 2020-05-31: qty 1

## 2020-05-31 MED ORDER — SODIUM CHLORIDE 0.9% FLUSH: 3.0000 mL | Freq: Two times a day (BID) | INTRAVENOUS | Status: DC

## 2020-05-31 MED ORDER — DEXAMETHASONE SODIUM PHOSPHATE 10 MG/ML IJ SOLN
INTRAMUSCULAR | Status: AC
  Filled 2020-05-31: qty 1

## 2020-05-31 MED ORDER — ACETAMINOPHEN 325 MG PO TABS: 650.0000 mg | ORAL_TABLET | ORAL | Status: DC | PRN

## 2020-05-31 MED ORDER — EPHEDRINE SULFATE 50 MG/ML IJ SOLN
INTRAMUSCULAR | Status: DC | PRN
  Administered 2020-05-31: 10 mg via INTRAVENOUS

## 2020-05-31 MED ORDER — MIDAZOLAM HCL 5 MG/5ML IJ SOLN
INTRAMUSCULAR | Status: DC | PRN
  Administered 2020-05-31: .5 mg via INTRAVENOUS

## 2020-05-31 MED ORDER — SODIUM CHLORIDE 0.9 % IR SOLN
Status: DC | PRN
  Administered 2020-05-31: 3000 mL

## 2020-05-31 MED ORDER — PROPOFOL 10 MG/ML IV BOLUS
INTRAVENOUS | Status: DC | PRN
  Administered 2020-05-31: 100 mg via INTRAVENOUS

## 2020-05-31 MED ORDER — ACETAMINOPHEN 10 MG/ML IV SOLN
INTRAVENOUS | Status: AC
  Filled 2020-05-31: qty 100

## 2020-05-31 MED ORDER — ONDANSETRON HCL 4 MG/2ML IJ SOLN
INTRAMUSCULAR | Status: DC | PRN
  Administered 2020-05-31: 4 mg via INTRAVENOUS

## 2020-05-31 MED ORDER — ONDANSETRON HCL 4 MG/2ML IJ SOLN: 4.0000 mg | Freq: Once | INTRAMUSCULAR | Status: DC | PRN

## 2020-05-31 MED ORDER — FENTANYL CITRATE (PF) 100 MCG/2ML IJ SOLN
INTRAMUSCULAR | Status: DC | PRN
  Administered 2020-05-31 (×2): 25 ug via INTRAVENOUS

## 2020-05-31 MED ORDER — SODIUM CHLORIDE 0.9% FLUSH: 3.0000 mL | INTRAVENOUS | Status: DC | PRN

## 2020-05-31 SURGICAL SUPPLY — 17 items
BAG DRN RND TRDRP ANRFLXCHMBR (UROLOGICAL SUPPLIES)
BAG URINE DRAIN 2000ML AR STRL (UROLOGICAL SUPPLIES) IMPLANT
BAG URO CATCHER STRL LF (MISCELLANEOUS) ×2 IMPLANT
CATH FOLEY 3WAY 30CC 22FR (CATHETERS) IMPLANT
ELECT REM PT RETURN 15FT ADLT (MISCELLANEOUS) ×2 IMPLANT
GLOVE SURG SS PI 8.0 STRL IVOR (GLOVE) IMPLANT
GOWN STRL REUS W/TWL XL LVL3 (GOWN DISPOSABLE) ×2 IMPLANT
HOLDER FOLEY CATH W/STRAP (MISCELLANEOUS) IMPLANT
KIT TURNOVER KIT A (KITS) IMPLANT
LOOP CUT BIPOLAR 24F LRG (ELECTROSURGICAL) ×2 IMPLANT
MANIFOLD NEPTUNE II (INSTRUMENTS) ×2 IMPLANT
PACK CYSTO (CUSTOM PROCEDURE TRAY) ×2 IMPLANT
SYR 30ML LL (SYRINGE) IMPLANT
SYR TOOMEY IRRIG 70ML (MISCELLANEOUS) ×2
SYRINGE TOOMEY IRRIG 70ML (MISCELLANEOUS) IMPLANT
TUBING CONNECTING 10 (TUBING) ×2 IMPLANT
TUBING UROLOGY SET (TUBING) ×2 IMPLANT

## 2020-05-31 NOTE — Interval H&P Note (Signed)
History and Physical Interval Note:  05/31/2020 10:15 AM  Dillon Young  has presented today for surgery, with the diagnosis of PROSTATE LESION.  The various methods of treatment have been discussed with the patient and family. After consideration of risks, benefits and other options for treatment, the patient has consented to  Procedure(s): CYSTOSCOPY TRANSURETHRAL RESECTION BIOPSY AND FULGURATION OF PROSTATE LESION  (TURP) (N/A) as a surgical intervention.  The patient's history has been reviewed, patient examined, no change in status, stable for surgery.  I have reviewed the patient's chart and labs.  Questions were answered to the patient's satisfaction.     Bjorn Pippin

## 2020-05-31 NOTE — Anesthesia Preprocedure Evaluation (Signed)
Anesthesia Evaluation  Patient identified by MRN, date of birth, ID band Patient awake    Reviewed: Allergy & Precautions, NPO status , Patient's Chart, lab work & pertinent test results  Airway Mallampati: III  TM Distance: >3 FB Neck ROM: Full    Dental no notable dental hx.    Pulmonary sleep apnea , former smoker,    Pulmonary exam normal breath sounds clear to auscultation       Cardiovascular hypertension, Pt. on medications and Pt. on home beta blockers + CAD, + Cardiac Stents and +CHF  Normal cardiovascular exam+ dysrhythmias Atrial Fibrillation  Rhythm:Regular Rate:Normal     Neuro/Psych  Headaches, Anxiety CVA, No Residual Symptoms    GI/Hepatic Neg liver ROS, GERD  Controlled,  Endo/Other  diabetes  Renal/GU negative Renal ROS     Musculoskeletal  (+) Arthritis ,   Abdominal   Peds  Hematology HLD   Anesthesia Other Findings PROSTATE LESION  Reproductive/Obstetrics                             Anesthesia Physical Anesthesia Plan  ASA: III  Anesthesia Plan: General   Post-op Pain Management:    Induction: Intravenous  PONV Risk Score and Plan: 2 and Ondansetron, Dexamethasone and Treatment may vary due to age or medical condition  Airway Management Planned: LMA  Additional Equipment:   Intra-op Plan:   Post-operative Plan: Extubation in OR  Informed Consent: I have reviewed the patients History and Physical, chart, labs and discussed the procedure including the risks, benefits and alternatives for the proposed anesthesia with the patient or authorized representative who has indicated his/her understanding and acceptance.     Dental advisory given  Plan Discussed with: CRNA  Anesthesia Plan Comments:         Anesthesia Quick Evaluation

## 2020-05-31 NOTE — Anesthesia Postprocedure Evaluation (Signed)
Anesthesia Post Note  Patient: KOBEN DAMAN  Procedure(s) Performed: CYSTOSCOPY TRANSURETHRAL RESECTION BIOPSY AND FULGURATION OF PROSTATE LESION  (TURP) (N/A )     Patient location during evaluation: PACU Anesthesia Type: General Level of consciousness: awake Pain management: pain level controlled Vital Signs Assessment: post-procedure vital signs reviewed and stable Respiratory status: spontaneous breathing, nonlabored ventilation, respiratory function stable and patient connected to nasal cannula oxygen Cardiovascular status: blood pressure returned to baseline and stable Postop Assessment: no apparent nausea or vomiting Anesthetic complications: no   No complications documented.  Last Vitals:  Vitals:   05/31/20 1135 05/31/20 1150  BP: (!) 138/58 (!) 133/56  Pulse: 70 64  Resp: 12 16  Temp: 36.7 C 36.6 C  SpO2: 97% 95%    Last Pain:  Vitals:   05/31/20 1208  TempSrc:   PainSc: 0-No pain                 Bellanie Matthew P Jorrell Kuster

## 2020-05-31 NOTE — Anesthesia Procedure Notes (Addendum)
Procedure Name: LMA Insertion Date/Time: 05/31/2020 10:35 AM Performed by: Illene Silver, CRNA Pre-anesthesia Checklist: Patient identified, Emergency Drugs available, Suction available and Patient being monitored Patient Re-evaluated:Patient Re-evaluated prior to induction Oxygen Delivery Method: Circle system utilized Preoxygenation: Pre-oxygenation with 100% oxygen Induction Type: IV induction Ventilation: Mask ventilation without difficulty LMA: LMA with gastric port inserted LMA Size: 5.0 Tube type: Oral Number of attempts: 1 Airway Equipment and Method: Oral airway Placement Confirmation: positive ETCO2 Tube secured with: Tape Dental Injury: Teeth and Oropharynx as per pre-operative assessment

## 2020-05-31 NOTE — Transfer of Care (Signed)
Immediate Anesthesia Transfer of Care Note  Patient: Dillon Young  Procedure(s) Performed: CYSTOSCOPY TRANSURETHRAL RESECTION BIOPSY AND FULGURATION OF PROSTATE LESION  (TURP) (N/A )  Patient Location: PACU  Anesthesia Type:General  Level of Consciousness: awake, alert , oriented and patient cooperative  Airway & Oxygen Therapy: Patient Spontanous Breathing and Patient connected to face mask oxygen  Post-op Assessment: Report given to RN, Post -op Vital signs reviewed and stable and Patient moving all extremities X 4  Post vital signs: stable  Last Vitals:  Vitals Value Taken Time  BP 144/57 05/31/20 1105  Temp 36.6 C 05/31/20 1105  Pulse 68 05/31/20 1112  Resp 12 05/31/20 1112  SpO2 99 % 05/31/20 1112  Vitals shown include unvalidated device data.  Last Pain:  Vitals:   05/31/20 1105  PainSc: 0-No pain         Complications: No complications documented.

## 2020-05-31 NOTE — Op Note (Signed)
Procedure: Cystoscopy with transurethral resection of prostatic urethral lesion.  Preop diagnosis: Recurrent gross hematuria with prostatic urethral lesion.  Postop diagnosis: Same.  Surgeon: Dr. Bjorn Pippin.  Anesthesia: General.  Specimen: Prostatic urethral lesion.  Drains: 20 French Foley catheter.  EBL: None.  Complications: None.  Indications: The patient is an 87 year old male with prior history of prostate cancer treated with a seed implant.  He has had recent issues with recurrent gross hematuria and was found on cystoscopy to have a papillary lesion in the anterior proximal prostatic urethra that is worrisome for neoplasm.  Procedure: He was taken operating room where he was given 2 g of Ancef.  A general anesthetic was induced.  He was placed in lithotomy position and fitted with PAS hose.  His perineum and genitalia were prepped with Betadine solution he was draped in usual sterile fashion.  Cystoscopy was performed using a 22 [place] scope and 30 degree lens.  Examination revealed a normal urethra.  The external sphincter was intact with mild radiation changes.  The prostatic urethra was widely patent consistent with prior TURP and there was neovascularity consistent with his history of radiation therapy.  In the proximal anterior prostatic urethra there was a papillary lesion that is approximately 1.5 to 2 cm in diameter it is worrisome for a urothelial neoplasm.  Further examination of bladder revealed some radiation neovascularity of the mucosa primarily in the trigone and base the bladder.  No papillary lesions were identified.  No stones were seen.  There was no significant trabeculation.  Ureteral orifices were unremarkable.  The cystoscope was removed and a 26 French continuous-flow resectoscope sheath was inserted.  This was fitted with an Wandra Scot handle with a bipolar loop and a 30 degree lens.  Saline was used the irrigant.  The lesion in the anterior proximal prostate  was then resected with a generous margin.  The resection bed was approximately 2 x 3 cm.  Hemostasis is achieved and the chips were removed.  Final inspection revealed no residual tumor, no retained chips and no active bleeding.  The scope was removed and a 20 [place] Foley catheter was inserted.  The balloon was filled with 10 mL of sterile fluid.  The catheter was placed to straight drainage.  He was taken down from the lithotomy position, his anesthetic was reversed and he was moved to recovery in stable condition.  There were no complications.

## 2020-05-31 NOTE — Discharge Instructions (Signed)
Transurethral Resection of Bladder Tumor, Care After This sheet gives you information about how to care for yourself after your procedure. Your health care provider may also give you more specific instructions. If you have problems or questions, contact your health care provider. What can I expect after the procedure? After the procedure, it is common to have:  A small amount of blood in your urine for up to 2 weeks.  Soreness or mild pain from your catheter. After your catheter is removed, you may have mild soreness, especially when urinating.  Pain in your lower abdomen. Follow these instructions at home: Medicines   Take over-the-counter and prescription medicines only as told by your health care provider.  If you were prescribed an antibiotic medicine, take it as told by your health care provider. Do not stop taking the antibiotic even if you start to feel better.  Do not drive for 24 hours if you were given a sedative during your procedure.  Ask your health care provider if the medicine prescribed to you: ? Requires you to avoid driving or using heavy machinery. ? Can cause constipation. You may need to take these actions to prevent or treat constipation:  Take over-the-counter or prescription medicines.  Eat foods that are high in fiber, such as beans, whole grains, and fresh fruits and vegetables.  Limit foods that are high in fat and processed sugars, such as fried or sweet foods. Activity  Return to your normal activities as told by your health care provider. Ask your health care provider what activities are safe for you.  Do not lift anything that is heavier than 10 lb (4.5 kg), or the limit that you are told, until your health care provider says that it is safe.  Avoid intense physical activity for as long as told by your health care provider.  Rest as told by your health care provider.  Avoid sitting for a long time without moving. Get up to take short walks every  1-2 hours. This is important to improve blood flow and breathing. Ask for help if you feel weak or unsteady. General instructions   Do not drink alcohol for as long as told by your health care provider. This is especially important if you are taking prescription pain medicines.  Do not take baths, swim, or use a hot tub until your health care provider approves. Ask your health care provider if you may take showers. You may only be allowed to take sponge baths.  If you have a catheter, follow instructions from your health care provider about caring for your catheter and your drainage bag.  Drink enough fluid to keep your urine pale yellow.  Wear compression stockings as told by your health care provider. These stockings help to prevent blood clots and reduce swelling in your legs.  Keep all follow-up visits as told by your health care provider. This is important. ? You will need to be followed closely with regular checks of your bladder and urethra (cystoscopies) to make sure that the cancer does not come back. Contact a health care provider if:  You have pain that gets worse or does not improve with medicine.  You have blood in your urine for more than 2 weeks.  You have cloudy or bad-smelling urine.  You become constipated. Signs of constipation may include having: ? Fewer than three bowel movements in a week. ? Difficulty having a bowel movement. ? Stools that are dry, hard, or larger than normal.  You have  a fever. Get help right away if:  You have: ? Severe pain. ? Bright red blood in your urine. ? Blood clots in your urine. ? A lot of blood in your urine.  Your catheter has been removed and you are not able to urinate.  You have a catheter in place and the catheter is not draining urine. Summary  After your procedure, it is common to have a small amount of blood in your urine, soreness or mild pain from your catheter, and pain in your lower abdomen.  Take  over-the-counter and prescription medicines only as told by your health care provider.  Rest as told by your health care provider. Follow your health care provider's instructions about returning to normal activities. Ask what activities are safe for you.  If you have a catheter, follow instructions from your health care provider about caring for your catheter and your drainage bag.  Get help right away if you cannot urinate, you have severe pain, or you have bright red blood or blood clots in your urine.  You may remove the catheter in the morning by cutting off the side arm to drain the balloon.  The catheter should just slide out after that.  If you don't feel comfortable doing that please call the office to return for removal.  You may resume the Eliquis on Saturday if the urine is clear.    This information is not intended to replace advice given to you by your health care provider. Make sure you discuss any questions you have with your health care provider. Document Revised: 12/12/2017 Document Reviewed: 12/12/2017 Elsevier Patient Education  2020 [employer].

## 2020-06-01 ENCOUNTER — Encounter (HOSPITAL_COMMUNITY): Payer: Self-pay | Admitting: Urology

## 2020-06-01 LAB — SURGICAL PATHOLOGY

## 2020-06-01 NOTE — Telephone Encounter (Signed)
Called pt to give pt Dr. Allyson Sabal reply.  Instructed pt that Dr. Allyson Sabal would like the pt to be on aspirin 81mg  throughout the course of the surgery and recovery. Pt is to start back on eliquis on Friday 1/7, per his urologist.  08-09-2001 pt to follow the instructions of his urologist. Pt verbalizes understanding and is appreciative of the phone call.

## 2020-06-07 ENCOUNTER — Other Ambulatory Visit: Payer: Self-pay

## 2020-06-07 ENCOUNTER — Encounter: Payer: Self-pay | Admitting: Cardiovascular Disease

## 2020-06-07 ENCOUNTER — Ambulatory Visit: Payer: HMO | Admitting: Cardiovascular Disease

## 2020-06-07 VITALS — BP 160/54 | HR 74 | Ht 69.0 in | Wt 180.0 lb

## 2020-06-07 DIAGNOSIS — I519 Heart disease, unspecified: Secondary | ICD-10-CM

## 2020-06-07 DIAGNOSIS — I679 Cerebrovascular disease, unspecified: Secondary | ICD-10-CM

## 2020-06-07 DIAGNOSIS — E785 Hyperlipidemia, unspecified: Secondary | ICD-10-CM

## 2020-06-07 DIAGNOSIS — I10 Essential (primary) hypertension: Secondary | ICD-10-CM

## 2020-06-07 DIAGNOSIS — I4819 Other persistent atrial fibrillation: Secondary | ICD-10-CM | POA: Diagnosis not present

## 2020-06-07 DIAGNOSIS — I2510 Atherosclerotic heart disease of native coronary artery without angina pectoris: Secondary | ICD-10-CM | POA: Diagnosis not present

## 2020-06-07 NOTE — Assessment & Plan Note (Signed)
History of PVCs on recent event monitor with 22% of frequency performed 04/19/2020.  He is unaware of these.  He did have bigeminal PVCs on his EKG today.

## 2020-06-07 NOTE — Patient Instructions (Signed)
Medication Instructions:  Your physician recommends that you continue on your current medications as directed. Please refer to the Current Medication list given to you today.  *If you need a refill on your cardiac medications before your next appointment, please call your pharmacy*   Testing/Procedures: Your physician has requested that you have a carotid duplex. This test is an ultrasound of the carotid arteries in your neck. It looks at blood flow through these arteries that supply the brain with blood. Allow one hour for this exam. There are no restrictions or special instructions. [place]. [address]  To be done in May 2022.     Follow-Up: At [facility], you and your health needs are our priority.  As part of our continuing mission to provide you with exceptional heart care, we have created designated Provider Care Teams.  These Care Teams include your primary Cardiologist (physician) and Advanced Practice Providers (APPs -  Physician Assistants and Nurse Practitioners) who all work together to provide you with the care you need, when you need it.  We recommend signing up for the patient portal called "MyChart".  Sign up information is provided on this After Visit Summary.  MyChart is used to connect with patients for Virtual Visits (Telemedicine).  Patients are able to view lab/test results, encounter notes, upcoming appointments, etc.  Non-urgent messages can be sent to your provider as well.   To learn more about what you can do with MyChart, go to [email or web address].    Your next appointment:   6 month(s)  The format for your next appointment:   In Person  Provider:   You will see one of the following Advanced Practice Providers on your designated Care Team:    Kerin Ransom, PA-C  [place], [place]  Coletta Memos, [place]  Then, [name], MD will plan to see you again in 12 month(s).

## 2020-06-07 NOTE — Assessment & Plan Note (Signed)
History of left ventricular dysfunction in the past with an EF in the 35% range which has subsequently improved up to 50 to 55% by recent 2D echo performed 05/16/2020 probably as a result of complete coronary vascularization as well as restoration of sinus rhythm.  He is on guideline directed optimal medical therapy for this followed by Dr. [name].

## 2020-06-07 NOTE — Assessment & Plan Note (Signed)
History of carotid artery disease with moderate left ICA stenosis by duplex ultrasound performed 10/17/2019.  We will repeat this again this coming May.

## 2020-06-07 NOTE — Progress Notes (Signed)
06/07/2020 SCHON ZEIDERS   October 27, 1932  [id number]  Primary Physician [name], MD Primary Cardiologist: [name] MD [name], [name]  HPI:  Dillon Young is a 87 y.o.   married Caucasian male father of 2 children, grandfather to 2 grandchildren he was formally a patient of Dr. [name]. I last  saw him in the office 11/27/2019.  He is accompanied by his wife Stanton Kidney today. 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 He works out 5 days a week and has joined [employer].He has a history of coronary artery disease status post angioplasty in 1994 and 1995 Of his circumflex coronary artery. He has not required cardiac catheterization since stress Myoview was performed in 2011 that was low risk and nonischemic. He has a history of hypertension and hyperlipidemia on medication. He had right carotid endarterectomy performed by Dr. [name] Early June 2013 followed by duplex ultrasound. He denies chest pain or shortness of breath. His major complaint is of cramping in his thighs which may be related to his statin drug. He was given a statin holiday which really did not impact his symptoms and therefore statin was restarted and follow-up lipid profile performed 07/05/14 revealed a total cholesterol 131, LDL 58 and HDL of 44. Recent carotid Dopplers revealed his endarterectomy site was widely patent with moderate left ICA stenosis and moderate left subclavian artery stenosis without symptoms. He developed symptoms in August of this year and saw [name]. A Myoview stress test performed 01/09/16 was notable for ischemia in the RCA distribution. Based on this he underwent cardiac catheterization by Dr. Varanasi8/21/17 revealing a 90% segmental mid dominant RCA stenosis which was stented with a Synergy 3.5 mm x 20 mm long drug-eluting stent. He did have a very small ramus branch that was 90% stenosed and not suitable for  intervention as well as a 70% mid LAD lesion although he had no evidence of anterior ischemia on his stress test. His symptoms of chest pain have resolved. Since I saw himthis months ago he is remained stable. He denies chest pain or shortness of breath. Does have mild ankle edema. He is in newly recognized atrial fibrillation today. He does relate a recent episode of hematuria which lasted for 10 days thought to be related related to his prostate and remote prostate cancer status post radioactive seed implantation. He did see Dr. [name], his urologist for evaluation and stopped his aspirin briefly with resolution of his hematuria. He has restarted dual anti-platelet therapy.  He  had a 2D echo that revealed decline in his LV function from 50 to 55% by his last 2D echo 2 years ago to 30 to 35%. This may be related to his atrial fibrillation. He feels weak and fatigued with some shortness of breath as well.  Because of relatively new onset chest pain he saw [name] in the office he began him on oral nitrate. His pain improved but he was referred for diagnostic coronary angiography to define his anatomy.  I performed cardiac catheterization on him 12/26/2017 revealing a patent RCA stent with high-grade mid LAD lesion which underwent PCI and drug-eluting stenting. He was also found at that time by a left cheek log to have an EF of 25 to 30%. He was in A. fib. He subsequently undergone pharmacologic cardioversion with Tikosyn. He is on optimal medical therapy for his LV dysfunction and has seen Dr.  DaltonMcLeanin the advanced heart failure clinicas recently as 12/01/2018.  His ejection fraction increased to normal after cardioversion. He was somewhat weak and bradycardic, and his carvedilol was discontinued by Dr. Lajuana Carry resulting in improvement in his symptoms. He denies chest pain or shortness of breath. He was in sinus rhythm when he saw Dr. McLean7/12/2018 and again  11/01/2019.  Since I saw him 6 months ago he continues to do well.  He and his wife walk 3-4 times a week.  His most recent 2D echo performed last month revealed preserved EF in the 50 to 55% range.  Event monitor showed 22% PVCs but no A. fib.  Carotid Dopplers performed 10/15/2019 did show moderate left ICA stenosis which will be repeated..    Current Meds  Medication Sig  . acetaminophen (TYLENOL) 500 MG tablet Take 500-1,000 mg by mouth every 6 (six) hours as needed (for headaches).  Marland Kitchen amLODipine (NORVASC) 2.5 MG tablet Take 1 tablet (2.5 mg total) by mouth daily.  Marland Kitchen apixaban (ELIQUIS) 5 MG TABS tablet Take 5 mg by mouth 2 (two) times daily.  . carvedilol (COREG) 3.125 MG tablet Take 1 tablet (3.125 mg total) by mouth 2 (two) times daily with a meal.  . Cholecalciferol (VITAMIN D-3) 25 MCG (1000 UT) CAPS Take 1,000 Units by mouth daily.  . clobetasol (TEMOVATE) 0.05 % external solution Apply 1 application topically See admin instructions. Apply to affected areas of the back twice weekly  . dofetilide (TIKOSYN) 125 MCG capsule TAKE 1 CAPSULE BY MOUTH TWICE DAILY  . fexofenadine (ALLEGRA) 180 MG tablet Take 180 mg by mouth daily as needed for allergies or rhinitis.   . fluticasone (FLONASE) 50 MCG/ACT nasal spray Place 2 sprays into both nostrils daily as needed for allergies or rhinitis.  Marland Kitchen gabapentin (NEURONTIN) 100 MG capsule Take 1 capsule (100 mg total) by mouth 3 (three) times daily.  . hydrALAZINE (APRESOLINE) 25 MG tablet Take 1 tablet (25 mg total) by mouth 3 (three) times daily.  . Hypromellose (ARTIFICIAL TEARS OP) Place 1 drop into both eyes 4 (four) times daily as needed (dry eyes).  Marland Kitchen ipratropium (ATROVENT) 0.06 % nasal spray Place 2 sprays into both nostrils 4 (four) times daily as needed for rhinitis.  Marland Kitchen levocetirizine (XYZAL) 5 MG tablet Take 5 mg by mouth daily as needed for allergies.  . melatonin 5 MG TABS Take 5 mg by mouth at bedtime as needed.  . nitroGLYCERIN  (NITROSTAT) 0.4 MG SL tablet Place 1 tablet (0.4 mg total) under the tongue every 5 (five) minutes as needed for chest pain.  . rosuvastatin (CRESTOR) 10 MG tablet TAKE 1 TABLET (10 MG TOTAL) BY MOUTH DAILY. NEEDS APPT FOR FUTURE REFILLS  . sacubitril-valsartan (ENTRESTO) 97-103 MG Take 1 tablet by mouth 2 (two) times daily.  . sodium chloride (OCEAN) 0.65 % SOLN nasal spray Place 1-2 sprays into both nostrils as needed for congestion.  Marland Kitchen spironolactone (ALDACTONE) 25 MG tablet Take 1 tablet (25 mg total) by mouth daily.  . traMADol (ULTRAM) 50 MG tablet Take 1 tablet (50 mg total) by mouth every 6 (six) hours as needed.  . triamcinolone cream (KENALOG) 0.1 % Apply 1 application topically daily as needed (to affected areas, for itching).   . Wheat Dextrin (BENEFIBER DRINK MIX PO) Take by mouth. Take 2 tablespoons daily     Allergies  Allergen Reactions  . Lipitor [Atorvastatin] Other (See Comments)    Muscle pain  . Other Rash and Other (See Comments)  Detergent- Rashes on the skin    Social History   Socioeconomic History  . Marital status: Married    Spouse name: Not on file  . Number of children: 2  . Years of education: Not on file  . Highest education level: Not on file  Occupational History  . Occupation: [employer]  Tobacco Use  . Smoking status: Former Smoker    Packs/day: 1.50    Years: 60.00    Pack years: 90.00    Types: Cigarettes    Quit date: 07/25/2008    Years since quitting: 11.8  . Smokeless tobacco: Never Used  Vaping Use  . Vaping Use: Never used  Substance and Sexual Activity  . Alcohol use: Yes    Alcohol/week: 0.0 standard drinks    Comment: Occ  . Drug use: No  . Sexual activity: Not on file  Other Topics Concern  . Not on file  Social History Narrative  . Not on file   Social Determinants of Health   Financial Resource Strain: Not on file  Food Insecurity: No Food Insecurity  . Worried About Charity fundraiser in the Last  Year: Never true  . Ran Out of Food in the Last Year: Never true  Transportation Needs: No Transportation Needs  . Lack of Transportation (Medical): No  . Lack of Transportation (Non-Medical): No  Physical Activity: Not on file  Stress: Not on file  Social Connections: Not on file  Intimate Partner Violence: Not on file     Review of Systems: General: negative for chills, fever, night sweats or weight changes.  Cardiovascular: negative for chest pain, dyspnea on exertion, edema, orthopnea, palpitations, paroxysmal nocturnal dyspnea or shortness of breath Dermatological: negative for rash Respiratory: negative for cough or wheezing Urologic: negative for hematuria Abdominal: negative for nausea, vomiting, diarrhea, bright red blood per rectum, melena, or hematemesis Neurologic: negative for visual changes, syncope, or dizziness All other systems reviewed and are otherwise negative except as noted above.    Blood pressure (!) 160/54, pulse 74, height 5\' 9"  (1.753 m), weight 180 lb (81.6 kg).  General appearance: alert and no distress Neck: no adenopathy, no carotid bruit, no JVD, supple, symmetrical, trachea midline and thyroid not enlarged, symmetric, no tenderness/mass/nodules Lungs: clear to auscultation bilaterally Heart: regular rate and rhythm, S1, S2 normal, no murmur, click, rub or gallop Extremities: extremities normal, atraumatic, no cyanosis or edema Pulses: 2+ and symmetric Skin: Skin color, texture, turgor normal. No rashes or lesions Neurologic: Alert and oriented X 3, normal strength and tone. Normal symmetric reflexes. Normal coordination and gait  EKG sinus rhythm at 74 with bigeminal PVCs.  I personally reviewed this EKG.  ASSESSMENT AND PLAN:   Hyperlipidemia with target LDL less than 70 History of hyperlipidemia on statin therapy with lipid profile performed 07/15/2019 revealing a total cholesterol 113, LDL 35 and HDL 46.  Essential hypertension History of  essential hypertension blood pressure measured today 160/54.  I did review his blood pressure log at home which showed blood pressures in the 120s to 130 range over 60-70.  He is on amlodipine, carvedilol, hydralazine and Entresto.  Cerebrovascular disease History of carotid artery disease with moderate left ICA stenosis by duplex ultrasound performed 10/17/2019.  We will repeat this again this coming May.  PVC's (premature ventricular contractions) History of PVCs on recent event monitor with 22% of frequency performed 04/19/2020.  He is unaware of these.  He did have bigeminal PVCs on his EKG today.  Persistent atrial fibrillation History of PAF currently in sinus rhythm on Eliquis oral anticoagulation.  He has undergone chemical cardioversion with dofetilide in the past which he remains on.  Left ventricular dysfunction History of left ventricular dysfunction in the past with an EF in the 35% range which has subsequently improved up to 50 to 55% by recent 2D echo performed 05/16/2020 probably as a result of complete coronary vascularization as well as restoration of sinus rhythm.  He is on guideline directed optimal medical therapy for this followed by Dr. [name].  CAD (coronary artery disease) History of CAD status post RCA intervention in the past by Dr. [name] 01/16/2016.  He did have 70% mid LAD lesion which was not intervened on.  I later performed cardiac catheterization on him 12/26/2017 revealing a patent RCA stent with high-grade mid LAD stenosis which I stented.  He has done well since.      [name] MD FACP,FACC,FAHA, [facility] 06/07/2020 9:48 AM

## 2020-06-07 NOTE — Assessment & Plan Note (Signed)
History of PAF currently in sinus rhythm on Eliquis oral anticoagulation.  He has undergone chemical cardioversion with dofetilide in the past which he remains on.

## 2020-06-07 NOTE — Assessment & Plan Note (Signed)
History of CAD status post RCA intervention in the past by Dr. [name] 01/16/2016.  He did have 70% mid LAD lesion which was not intervened on.  I later performed cardiac catheterization on him 12/26/2017 revealing a patent RCA stent with high-grade mid LAD stenosis which I stented.  He has done well since.

## 2020-06-07 NOTE — Assessment & Plan Note (Signed)
History of hyperlipidemia on statin therapy with lipid profile performed 07/15/2019 revealing a total cholesterol 113, LDL 35 and HDL 46.

## 2020-06-07 NOTE — Assessment & Plan Note (Signed)
History of essential hypertension blood pressure measured today 160/54.  I did review his blood pressure log at home which showed blood pressures in the 120s to 130 range over 60-70.  He is on amlodipine, carvedilol, hydralazine and Entresto.

## 2020-06-08 DIAGNOSIS — C673 Malignant neoplasm of anterior wall of bladder: Secondary | ICD-10-CM | POA: Diagnosis not present

## 2020-06-08 DIAGNOSIS — Z8546 Personal history of malignant neoplasm of prostate: Secondary | ICD-10-CM | POA: Diagnosis not present

## 2020-06-08 DIAGNOSIS — R310 Gross hematuria: Secondary | ICD-10-CM | POA: Diagnosis not present

## 2020-06-16 ENCOUNTER — Encounter (HOSPITAL_COMMUNITY): Payer: Self-pay | Admitting: Cardiology

## 2020-06-16 ENCOUNTER — Other Ambulatory Visit: Payer: Self-pay

## 2020-06-16 ENCOUNTER — Ambulatory Visit (HOSPITAL_COMMUNITY)
Admission: RE | Admit: 2020-06-16 | Discharge: 2020-06-16 | Disposition: A | Discharge status: Home / Self Care | Payer: HMO | Source: Ambulatory Visit | Attending: Cardiology | Admitting: Cardiology

## 2020-06-16 VITALS — BP 150/70 | HR 84 | Wt 179.8 lb

## 2020-06-16 DIAGNOSIS — G4733 Obstructive sleep apnea (adult) (pediatric): Secondary | ICD-10-CM | POA: Diagnosis not present

## 2020-06-16 DIAGNOSIS — I5022 Chronic systolic (congestive) heart failure: Secondary | ICD-10-CM | POA: Diagnosis not present

## 2020-06-16 DIAGNOSIS — Z87891 Personal history of nicotine dependence: Secondary | ICD-10-CM | POA: Insufficient documentation

## 2020-06-16 DIAGNOSIS — Z8551 Personal history of malignant neoplasm of bladder: Secondary | ICD-10-CM | POA: Diagnosis not present

## 2020-06-16 DIAGNOSIS — Z79899 Other long term (current) drug therapy: Secondary | ICD-10-CM | POA: Diagnosis not present

## 2020-06-16 DIAGNOSIS — I480 Paroxysmal atrial fibrillation: Secondary | ICD-10-CM | POA: Diagnosis not present

## 2020-06-16 DIAGNOSIS — I519 Heart disease, unspecified: Secondary | ICD-10-CM | POA: Diagnosis not present

## 2020-06-16 DIAGNOSIS — I493 Ventricular premature depolarization: Secondary | ICD-10-CM | POA: Insufficient documentation

## 2020-06-16 DIAGNOSIS — I110 Hypertensive heart disease with heart failure: Secondary | ICD-10-CM | POA: Diagnosis not present

## 2020-06-16 DIAGNOSIS — I255 Ischemic cardiomyopathy: Secondary | ICD-10-CM | POA: Diagnosis not present

## 2020-06-16 DIAGNOSIS — I2510 Atherosclerotic heart disease of native coronary artery without angina pectoris: Secondary | ICD-10-CM | POA: Diagnosis not present

## 2020-06-16 DIAGNOSIS — I10 Essential (primary) hypertension: Secondary | ICD-10-CM | POA: Diagnosis not present

## 2020-06-16 DIAGNOSIS — E785 Hyperlipidemia, unspecified: Secondary | ICD-10-CM | POA: Insufficient documentation

## 2020-06-16 DIAGNOSIS — I472 Ventricular tachycardia: Secondary | ICD-10-CM | POA: Insufficient documentation

## 2020-06-16 DIAGNOSIS — Z7901 Long term (current) use of anticoagulants: Secondary | ICD-10-CM | POA: Insufficient documentation

## 2020-06-16 LAB — MAGNESIUM: Magnesium: 2.1 mg/dL (ref 1.7–2.4)

## 2020-06-16 LAB — LIPID PANEL
Cholesterol: 123 mg/dL (ref 0–200)
HDL: 51 mg/dL (ref >40)
LDL Cholesterol: 53 mg/dL (ref 0–99)
Total CHOL/HDL Ratio: 2.4 RATIO
Triglycerides: 93 mg/dL (ref <150)
VLDL: 19 mg/dL (ref 0–40)

## 2020-06-16 MED ORDER — CARVEDILOL 6.25 MG PO TABS: 6.2500 mg | ORAL_TABLET | Freq: Two times a day (BID) | ORAL | 6 refills | Status: DC

## 2020-06-16 MED ORDER — HYDRALAZINE HCL 25 MG PO TABS: 37.5000 mg | ORAL_TABLET | Freq: Three times a day (TID) | ORAL | 5 refills | Status: DC

## 2020-06-16 NOTE — Progress Notes (Signed)
Date:  06/16/2020   ID:  [name], DOB 17-Feb-1933, MRN [id number]   Provider location: [facility] Type of Visit: Established patient   PCP:  [name], MD  Cardiologist:  [name], MD HF Cardiology: Dr. [name]   History of Present Illness: BAILEY WALDECKER is a 87 y.o. male who has a h/o coronary artery disease, chronic systolic CHF, and persistent atrial fibrillation, he was initially referred by Dr. [name] for evaluation of CHF.  He is status post angioplasty in 1994 and 1995of his circumflex coronary artery. He had right carotid endarterectomy performed by Dr. Curt Jews June 2013.  Given angina, he had cath in 8/17.  This showed severe RCA stenosis treated with DES. He also had a very small ramus branch that was 90% stenosed and not suitable for intervention as well as a 70% mid LAD lesion although he had no evidence of anterior ischemia on his stress test.  He was diagnosed in 5/19 with new onset atrial fibrillation. Echo was done in 5/19 and showed EF decreased to 30-35%, normal EF by echo in August 2017. He had a DCCV in 5/19, unfortunately, he only held NSR for a few days and was been back in atrial fibrillation soon after that.  He had recurrent chest pain and was again cathed in 01/04/18, this time showing 80% proximal/mid LAD that was treated with DES.  In 10/19, he was admitted for Tikosyn load and was cardioverted back to NSR.   Echo in 1/20 showed  EF up to 55-60% with mild LVH.    11/21 Zio patch showed 22% PVCs with short NSVT runs.  Echo was done in 12/21, showing EF 50-55%, mild LVH, mild MR, normal RV.  Given frequent PVCs, patient was sent to see EP (Dr Rayann Heman).  He did not think that the patient was an ablation candidate.   He has been diagnosed with bladder cancer and has had occasional hematuria.   He presents for followup of CHF, PVCs, and atrial fibrillation.  Symptomatically, he is doing ok.  Still with frequent PVCs  noted on today's ECG.  He does not feel palpitations.  No significant dyspnea with ADLs and walking on flat ground.  He has been exercising daily.  He is sleepy during the day at times and has been sleeping in a recliner for years. BP is mildly elevated today, he says that it is generally lower when he checks at home. He does not like amlodipine as it causes his ankles to swell. No chest pain.  He has occasional hematuria thought to be related to his bladder cancer, but none currently.   ECG (personally reviewed): NSR with PVCs, QTc 449 msec  Labs (5/19): LDL 50 Labs (8/19): K 4.1, creatinine 1.14 Labs (10/19): K 4.6, creatinine 1.17 Labs (11/19): K 4.5, creatinine 1.08 Labs (1/20): LDL 48 Labs (2/20): K 4.4, creatinine 1.18 Labs (2/21): K 4.2, creatinine 1.14, LDL 35 Labs (1/22): K 4, creatinine 1.09, hgb 13.5  PMH: 1. HTN 2. Hyperlipidemia 3. Carotid artery disease: Right CEA in 6/13.  - Carotid dopplers (5/20): 1-39% BICA stenosis, left subclavian stenosis.  - Carotid dopplers (5/21): s/p right CEA, 123456 LICA stenosis.  4. CAD: - PTCA to LCx in 1994, 1995.  - Angina 8/17 => DES to RCA, also noted to have 70% mLAD, 90% stenosis small ramus.  - LHC 12/26/17 with 80% ostial-proximal LAD, EF < 25% on LV-gram.  DES to LAD.  5. Chronic systolic CHF: Ischemic cardiomyopathy.   - Echo (8/17) with normal EF.  - Echo (5/19) with EF 30-35%, severe LV dilation, regional wall motion abnormalities.  - Echo (1/20): EF 55-60%, mild LVH, normal RV size and systolic function.  - Echo (12/21): EF 50-55%, mild LVH, normal RV size and systolic function, moderate LAE, mild MR, IVC normal.  6. Atrial fibrillation: Diagnosed 5/19.   - DCCV 5/19 only held for about 2 days.  - Tikosyn loaded in 10/19 and DCCV to NSR.  7. Bladder cancer 8. PVCs: Zio patch (11/21) with 22% PVCs, short NSVT runs.   Current Outpatient Medications  Medication Sig Dispense Refill  . acetaminophen (TYLENOL) 500 MG tablet  Take 500-1,000 mg by mouth every 6 (six) hours as needed (for headaches).    Marland Kitchen apixaban (ELIQUIS) 5 MG TABS tablet Take 5 mg by mouth 2 (two) times daily.    . Cholecalciferol (VITAMIN D-3) 25 MCG (1000 UT) CAPS Take 1,000 Units by mouth daily.    . clobetasol (TEMOVATE) 0.05 % external solution Apply 1 application topically See admin instructions. Apply to affected areas of the back twice weekly    . dofetilide (TIKOSYN) 125 MCG capsule TAKE 1 CAPSULE BY MOUTH TWICE DAILY 180 capsule 3  . fexofenadine (ALLEGRA) 180 MG tablet Take 180 mg by mouth daily as needed for allergies or rhinitis.     . fluticasone (FLONASE) 50 MCG/ACT nasal spray Place 2 sprays into both nostrils daily as needed for allergies or rhinitis. 48 g 1  . gabapentin (NEURONTIN) 100 MG capsule Take 1 capsule (100 mg total) by mouth 3 (three) times daily. 90 capsule 3  . Hypromellose (ARTIFICIAL TEARS OP) Place 1 drop into both eyes 4 (four) times daily as needed (dry eyes).    Marland Kitchen ipratropium (ATROVENT) 0.06 % nasal spray Place 2 sprays into both nostrils 4 (four) times daily as needed for rhinitis. 45 mL 1  . levocetirizine (XYZAL) 5 MG tablet Take 5 mg by mouth daily as needed for allergies.    . melatonin 5 MG TABS Take 5 mg by mouth at bedtime as needed.    . nitroGLYCERIN (NITROSTAT) 0.4 MG SL tablet Place 1 tablet (0.4 mg total) under the tongue every 5 (five) minutes as needed for chest pain. 25 tablet 3  . rosuvastatin (CRESTOR) 10 MG tablet TAKE 1 TABLET (10 MG TOTAL) BY MOUTH DAILY. NEEDS APPT FOR FUTURE REFILLS 90 tablet 2  . sacubitril-valsartan (ENTRESTO) 97-103 MG Take 1 tablet by mouth 2 (two) times daily. 180 tablet 2  . sodium chloride (OCEAN) 0.65 % SOLN nasal spray Place 1-2 sprays into both nostrils as needed for congestion.    Marland Kitchen spironolactone (ALDACTONE) 25 MG tablet Take 1 tablet (25 mg total) by mouth daily. 90 tablet 3  . traMADol (ULTRAM) 50 MG tablet Take 1 tablet (50 mg total) by mouth every 6 (six) hours  as needed. 75 tablet 1  . triamcinolone cream (KENALOG) 0.1 % Apply 1 application topically daily as needed (to affected areas, for itching).     . Wheat Dextrin (BENEFIBER DRINK MIX PO) Take by mouth. Take 2 tablespoons daily    . carvedilol (COREG) 6.25 MG tablet Take 1 tablet (6.25 mg total) by mouth 2 (two) times daily with a meal. 60 tablet 6  . hydrALAZINE (APRESOLINE) 25 MG tablet Take 1.5 tablets (37.5 mg total) by mouth 3 (three) times daily. 135 tablet 5   No current facility-administered medications for this encounter.  Allergies:   Lipitor [atorvastatin] and Other   Social History:  The patient  reports that he quit smoking about 11 years ago. His smoking use included cigarettes. He has a 90.00 pack-year smoking history. He has never used smokeless tobacco. He reports current alcohol use. He reports that he does not use drugs.   Family History:  The patient's family history includes Asthma in his paternal grandmother; Heart disease in his father; Hypertension in his mother.   ROS:  Please see the history of present illness.   All other systems are personally reviewed and negative.   Exam:   BP (!) 150/70   Pulse 84   Wt 81.6 kg (179 lb 12.8 oz)   SpO2 94%   BMI 26.55 kg/m  General: NAD Neck: No JVD, no thyromegaly or thyroid nodule.  Lungs: Clear to auscultation bilaterally with normal respiratory effort. CV: Nondisplaced PMI.  Heart regular S1/S2, no S3/S4, no murmur.  Trace ankle edema.  No carotid bruit.  Normal pedal pulses.  Abdomen: Soft, nontender, no hepatosplenomegaly, no distention.  Skin: Intact without lesions or rashes.  Neurologic: Alert and oriented x 3.  Psych: Normal affect. Extremities: No clubbing or cyanosis.  HEENT: Normal.   Recent Labs: 02/05/2020: ALT 6 05/31/2020: BUN 20; Creatinine, Ser 1.09; Hemoglobin 13.5; Platelets 164; Potassium 4.0; Sodium 142 06/16/2020: Magnesium 2.1  Personally reviewed   Wt Readings from Last 3 Encounters:   06/16/20 81.6 kg (179 lb 12.8 oz)  06/07/20 81.6 kg (180 lb)  05/31/20 81.8 kg (180 lb 6.4 oz)      ASSESSMENT AND PLAN:  1. Chronic systolic CHF: Suspect mixed ischemic/nonischemic cardiomyopathy.  Echo in 5/19 with EF 30-35% and severe LV dilation.  CAD likely plays role, but fall in EF was noted around the time he went into atrial fibrillation.  No RVR, but can see fall in EF with afib even in the absence of RVR. He has been in NSR on Tikosyn, and echo from 1/20 showed EF up to 55-60%.  I was concerned that there would be a fall in his EF due to frequent PVCs, but echo in 12/21 showed EF 50-55%. On exam, he is euvolemic.  NYHA class II symptoms.  - Increase Coreg to 6.25 mg bid with frequent PVCs.  - Continue Entresto 97/103 bid.  Recent BMET stable.   - Continue spironolactone 25 mg daily.  2. CAD: Most recently, DES to proximal/mid LAD on 12/26/17.  He was on Plavix for > 6 months then stopped it with gross hematuria.  He has had increased PVCs for the last several months, but LV function remains preserved and he has had no chest pain.  - Continue apixaban 5 mg bid.  Recent CBC stable.  - No ASA with apixaban use.  - Continue Crestor, check lipids today.  3. Atrial fibrillation: Paroxysmal.  DCCV in 5/19 only held a few days.  Tikosyn loaded in 10/19 with DCCV, now in NSR.   QTc ok on ECG today.  - Continue Eliquis 5 mg bid.  - Continue Tikosyn, recent BMET ok.  4. HTN: BP high in the office today but controlled at home.  He asks to simplify his medication regimen and does not want to take amlodipine due to ankle edema.  - Stop amlodipine.  - Increase hydralazine to 37.5 mg tid.  - Increase Coreg to 6.25 mg bid.  - Check BP daily x 2 wks and call in readings.  5. Carotid stenosis: Repeat carotid dopplers 5/22.  6. PVCs: 22% PVCs with NSVT runs on Zio patch in 11/21.  This is despite being on Tikosyn and Coreg 3.125 mg bid.  He has not had chest pain and EF remains preserved.  He saw  Dr. Rayann Heman who recommended medical management rather than attempting ablation.  - Increase Coreg to 6.25 mg bid.  - Continue Tikosyn.  7. OSA: Significant daytime sleepiness, suspect OSA.   - Home sleep study.    Followup in 3 months.   Signed, [name], MD  06/16/2020  [facility] [address] [facility] [place] [address] [phone] (office) [phone] (fax)

## 2020-06-16 NOTE — Patient Instructions (Addendum)
Stop Amlodipine  Increase Carvedilol to 6.25 mg Twice daily   Increase Hydralazine to 37.5 mg (1 & 1/2 tabs) Three times a day   Labs done today, your results will be available in MyChart, we will contact you for abnormal readings.  Your physician has requested that you regularly monitor and record your blood pressure readings at home. Please use the same machine at the same time of day to check your readings and record them to bring to your follow-up visit.  Your provider has recommended that you have a home sleep study.  This has to be approved by your insurance company. We will schedule you an appointment to pick up the equipment once it has been authorization. Once you have the equipment you will download the app on your phone and follow the instructions. YOUR PIN NUMBER IS: [id number]. Once you have completed the test the information is sent to the company through [employer] and you can dispose of the equipment. If your test is positive you will receive a call from Dr Theodosia Blender office [facility]) to set up your CPAP equipment.  Your physician recommends that you schedule a follow-up appointment in: 3 months  If you have any questions or concerns before your next appointment please send [place] a message through [place] or call our office at [phone].    TO LEAVE A MESSAGE FOR THE NURSE SELECT OPTION 2, PLEASE LEAVE A MESSAGE INCLUDING: . YOUR NAME . DATE OF BIRTH . CALL BACK NUMBER . REASON FOR CALL**this is important as we prioritize the call backs  [place] AS LONG AS YOU CALL BEFORE 4:00 PM  At the [facility], you and your health needs are our priority. As part of our continuing mission to provide you with exceptional heart care, we have created designated Provider Care Teams. These Care Teams include your primary Cardiologist (physician) and Advanced Practice Providers (APPs- Physician Assistants and Nurse Practitioners) who  all work together to provide you with the care you need, when you need it.   You may see any of the following providers on your designated Care Team at your next follow up: Marland Kitchen Dr Glori Bickers . Dr [name] . Darrick Grinder, NP . Lyda Jester, PA . Audry Riles, PharmD   Please be sure to bring in all your medications bottles to every appointment.

## 2020-06-20 ENCOUNTER — Other Ambulatory Visit: Payer: Self-pay

## 2020-06-20 ENCOUNTER — Ambulatory Visit: Payer: HMO | Admitting: Podiatry

## 2020-06-20 DIAGNOSIS — M10071 Idiopathic gout, right ankle and foot: Secondary | ICD-10-CM | POA: Diagnosis not present

## 2020-06-20 DIAGNOSIS — M7751 Other enthesopathy of right foot: Secondary | ICD-10-CM

## 2020-06-20 MED ORDER — COLCHICINE 0.6 MG PO TABS: 0.6000 mg | ORAL_TABLET | Freq: Every day | ORAL | 0 refills | Status: DC

## 2020-06-21 ENCOUNTER — Telehealth (HOSPITAL_COMMUNITY): Payer: Self-pay | Admitting: Cardiology

## 2020-06-21 NOTE — Telephone Encounter (Signed)
Pt need medication consult, pt can be  reached [phone]. Please advise

## 2020-06-21 NOTE — Telephone Encounter (Signed)
BP has been high.  He can stop hydralazine.  He cannot take amlodipine due to lower leg swelling when tried in the past.  He can increase Coreg to 12.5 mg bid to control BP off hydralazine.

## 2020-06-21 NOTE — Telephone Encounter (Signed)
Called pt he said hydralazine is causing very painful gout.  Pt saw his foot doctor yesterday and they told him hydralazine was the cause and he needed to contact his cardiologist to see if he can prescribe another medication.   Routed to [place]

## 2020-06-23 NOTE — Telephone Encounter (Signed)
Left vm for pt to return my call.  

## 2020-06-28 DIAGNOSIS — C673 Malignant neoplasm of anterior wall of bladder: Secondary | ICD-10-CM | POA: Diagnosis not present

## 2020-06-28 DIAGNOSIS — Z5111 Encounter for antineoplastic chemotherapy: Secondary | ICD-10-CM | POA: Diagnosis not present

## 2020-06-29 DIAGNOSIS — M10071 Idiopathic gout, right ankle and foot: Secondary | ICD-10-CM

## 2020-06-29 DIAGNOSIS — M7751 Other enthesopathy of right foot: Secondary | ICD-10-CM | POA: Diagnosis not present

## 2020-06-29 MED ORDER — BETAMETHASONE SOD PHOS & ACET 6 (3-3) MG/ML IJ SUSP
3.0000 mg | Freq: Once | INTRAMUSCULAR | Status: AC
  Administered 2020-06-29: 3 mg via INTRA_ARTICULAR

## 2020-06-29 NOTE — Progress Notes (Signed)
HPI: 87 y.o. male presenting today as a new patient for evaluation of pain and tenderness to the right great toe joint that is been going on for approximately 3-4 days now.  He states that recently he increased his hydrochlorothiazide from his PCP.  This is the only change in medication or activity that he can recall.  He has noticed redness with swelling and tenderness to touch to the great toe joint.  He denies a history of trauma or injury.  Sudden onset.  Past Medical History:  Diagnosis Date  . Adenomatous colon polyp   . Allergic rhinitis   . Anemia   . Anxiety   . BPH (benign prostatic hyperplasia)   . CAD (coronary artery disease)    sees Dr. [name]   . Carotid artery disease ([place])   . CHF (congestive heart failure) ([place])   . Chronic kidney disease    [place]. Prostate cancer  . Clotting disorder ([place])   . CVA (cerebral infarction)   . Diabetes mellitus    "Borderline"  . Diverticulitis   . Dysrhythmia    atrial fib - hx of   . Edema    recent swelling in feet and ankles   . GERD (gastroesophageal reflux disease)    history  . Headache   . Hemorrhoids   . History of kidney stones   . HTN (hypertension)   . Hx of radiation therapy    for prostate - 2013   . Hyperlipidemia LDL goal <70   . IBS (irritable bowel syndrome)   . Low back pain   . Pre-diabetes   . Prostate cancer ([place]) 2008   seed implantation  . Sleep apnea    sitting in recliner helps him to breathe per pt , never used cpap sleep study - pt reports could not go to sleep- 6 years ago   . Smoker   . Stroke [facility])    hx of mini strokes years ago      Physical Exam: General: The patient is alert and oriented x3 in no acute distress.  Dermatology: Skin is warm, dry and supple bilateral lower extremities. Negative for open lesions or macerations.  Vascular: Palpable pedal pulses bilaterally.  Erythema with edema noted to the right great toe joint focally. Capillary refill within normal  limits.  Neurological: Epicritic and protective threshold grossly intact bilaterally.   Musculoskeletal Exam: Range of motion within normal limits to all pedal and ankle joints bilateral. Muscle strength 5/5 in all groups bilateral. Pain on palpation and range of motion to the first MTPJ right foot. Associated erythema and edema also noted consistent with acute gout flare.   Radiographic Exam:  Normal osseous mineralization. Joint spaces preserved. No fracture/dislocation/boney destruction.    Assessment: 1. Acute gout 1st MTPJ right foot 2. Metatarsal phalangeal joint capsulitis right   Plan of Care:  1. Patient evaluated. X-Rays reviewed.  2. Injection of 0.5cc Celestone Soluspan injected into the 1st MTPJ right foot 3. Rx colcrys 0.6mg  #14 daily 4. Return to clinic prn      Edrick Kins, DPM [facility]  Dr. Edrick Kins, DPM    [address], [place] [address]  Office [phone]  Fax [phone]

## 2020-06-30 ENCOUNTER — Encounter: Payer: Self-pay | Admitting: Physician Assistant

## 2020-06-30 ENCOUNTER — Ambulatory Visit: Payer: HMO | Admitting: Physician Assistant

## 2020-06-30 ENCOUNTER — Other Ambulatory Visit (INDEPENDENT_AMBULATORY_CARE_PROVIDER_SITE_OTHER): Payer: HMO

## 2020-06-30 VITALS — BP 140/64 | HR 90 | Ht 69.0 in | Wt 177.6 lb

## 2020-06-30 DIAGNOSIS — K8050 Calculus of bile duct without cholangitis or cholecystitis without obstruction: Secondary | ICD-10-CM

## 2020-06-30 DIAGNOSIS — R110 Nausea: Secondary | ICD-10-CM | POA: Diagnosis not present

## 2020-06-30 DIAGNOSIS — R109 Unspecified abdominal pain: Secondary | ICD-10-CM

## 2020-06-30 LAB — COMPREHENSIVE METABOLIC PANEL
ALT: 9 U/L (ref 0–53)
AST: 12 U/L (ref 0–37)
Albumin: 4.4 g/dL (ref 3.5–5.2)
Alkaline Phosphatase: 51 U/L (ref 39–117)
BUN: 21 mg/dL (ref 6–23)
CO2: 30 mEq/L (ref 19–32)
Calcium: 9.8 mg/dL (ref 8.4–10.5)
Chloride: 102 mEq/L (ref 96–112)
Creatinine, Ser: 1.17 mg/dL (ref 0.40–1.50)
GFR: 56.68 mL/min — ABNORMAL LOW (ref >60.00)
Glucose, Bld: 89 mg/dL (ref 70–99)
Potassium: 4.5 mEq/L (ref 3.5–5.1)
Sodium: 138 mEq/L (ref 135–145)
Total Bilirubin: 0.8 mg/dL (ref 0.2–1.2)
Total Protein: 7.4 g/dL (ref 6.0–8.3)

## 2020-06-30 LAB — CBC WITH DIFFERENTIAL/PLATELET
Basophils Absolute: 0 10*3/uL (ref 0.0–0.1)
Basophils Relative: 0.6 % (ref 0.0–3.0)
Eosinophils Absolute: 0 10*3/uL (ref 0.0–0.7)
Eosinophils Relative: 0.7 % (ref 0.0–5.0)
HCT: 40.2 % (ref 39.0–52.0)
Hemoglobin: 13.2 g/dL (ref 13.0–17.0)
Lymphocytes Relative: 16.7 % (ref 12.0–46.0)
Lymphs Abs: 1.1 10*3/uL (ref 0.7–4.0)
MCHC: 32.7 g/dL (ref 30.0–36.0)
MCV: 83.7 fl (ref 78.0–100.0)
Monocytes Absolute: 1.1 10*3/uL — ABNORMAL HIGH (ref 0.1–1.0)
Monocytes Relative: 17.4 % — ABNORMAL HIGH (ref 3.0–12.0)
Neutro Abs: 4.1 10*3/uL (ref 1.4–7.7)
Neutrophils Relative %: 64.6 % (ref 43.0–77.0)
Platelets: 178 10*3/uL (ref 150.0–400.0)
RBC: 4.8 Mil/uL (ref 4.22–5.81)
RDW: 15.1 % (ref 11.5–15.5)
WBC: 6.4 10*3/uL (ref 4.0–10.5)

## 2020-06-30 MED ORDER — HYDROCORTISONE (PERIANAL) 2.5 % EX CREA: 1.0000 "application " | TOPICAL_CREAM | Freq: Three times a day (TID) | CUTANEOUS | 2 refills | Status: DC

## 2020-06-30 MED ORDER — DICYCLOMINE HCL 10 MG PO CAPS: 10.0000 mg | ORAL_CAPSULE | Freq: Four times a day (QID) | ORAL | 1 refills | Status: DC | PRN

## 2020-06-30 NOTE — Progress Notes (Signed)
Subjective:    Patient ID: Dillon Young, male    DOB: 1933-02-20, 87 y.o.   MRN: [id number]  HPI Dillon Young is a pleasant 87 year old white male, known to Dr. Carlean Purl who was last seen in the office in 2017, at that time for internal hemorrhoids with prolapse.  He is status post previous hemorrhoidal banding. He comes in today after 2 very recent acute episodes of severe abdominal pain which he said occurred on 06/27/2020 and again on 06/28/2020.  Both of these episodes occurred after meals, the first after he had eaten at [place] [employer].  He says both lasted around 30 minutes, was located in the mid upper abdomen without radiation, and described as a hard steady pain that was "bad".  He says the pain was so bad he thought he might have to go to the emergency room.  It was associated with nausea without vomiting, no fever or chills though did feel a little clammy.  No diarrhea melena etc.  He took 2 of his wife's Bentyl tablets and says within about 10 minutes of that with each episode the pain started improving.  He has not had any recurrence over the past couple of days and had not had any previous similar episodes.  He feels fine currently and is eating today and yesterday without difficulty. Patient does have history of coronary artery disease is status post drug-eluting stent, also with known diverticulosis and renal cysts.  He did undergo CT of the abdomen pelvis in August 2021 for complaint of abdominal pain and was noted to have multiple bilateral renal cysts most consistent with benign cysts.  Diverticulosis and was also noted to have cholelithiasis. Last colonoscopy May 2010 that was done for history of adenomatous colon polyps.  He was noted to have some changes of radiation proctitis, internal hemorrhoids and moderate sigmoid diverticulosis, no recurrent polyps. Patient relates that he has recently undergone some treatment for bladder cancer with a bladder instillation.  Review of Systems  Pertinent positive and negative review of systems were noted in the above HPI section.  All other review of systems was otherwise negative.  Outpatient Encounter Medications as of 06/30/2020  Medication Sig  . acetaminophen (TYLENOL) 500 MG tablet Take 500-1,000 mg by mouth every 6 (six) hours as needed (for headaches).  Marland Kitchen apixaban (ELIQUIS) 5 MG TABS tablet Take 5 mg by mouth 2 (two) times daily.  . carvedilol (COREG) 6.25 MG tablet Take 1 tablet (6.25 mg total) by mouth 2 (two) times daily with a meal.  . Cholecalciferol (VITAMIN D-3) 25 MCG (1000 UT) CAPS Take 1,000 Units by mouth daily.  . clobetasol (TEMOVATE) 0.05 % external solution Apply 1 application topically See admin instructions. Apply to affected areas of the back twice weekly  . colchicine 0.6 MG tablet Take 1 tablet (0.6 mg total) by mouth daily. 2 tablets stat, then 1 tablet daily by mouth  . dicyclomine (BENTYL) 10 MG capsule Take 1 capsule (10 mg total) by mouth every 6 (six) hours as needed (abdominal pain).  Marland Kitchen dofetilide (TIKOSYN) 125 MCG capsule TAKE 1 CAPSULE BY MOUTH TWICE DAILY  . fexofenadine (ALLEGRA) 180 MG tablet Take 180 mg by mouth daily as needed for allergies or rhinitis.   . fluticasone (FLONASE) 50 MCG/ACT nasal spray Place 2 sprays into both nostrils daily as needed for allergies or rhinitis.  . hydrocortisone (ANUSOL-HC) 2.5 % rectal cream Place 1 application rectally 3 (three) times daily.  . Hypromellose (ARTIFICIAL TEARS OP) Place 1  drop into both eyes 4 (four) times daily as needed (dry eyes).  Marland Kitchen ipratropium (ATROVENT) 0.06 % nasal spray Place 2 sprays into both nostrils 4 (four) times daily as needed for rhinitis.  Marland Kitchen levocetirizine (XYZAL) 5 MG tablet Take 5 mg by mouth daily as needed for allergies.  . nitroGLYCERIN (NITROSTAT) 0.4 MG SL tablet Place 1 tablet (0.4 mg total) under the tongue every 5 (five) minutes as needed for chest pain.  . rosuvastatin (CRESTOR) 10 MG tablet TAKE 1 TABLET (10 MG TOTAL) BY  MOUTH DAILY. NEEDS APPT FOR FUTURE REFILLS  . sacubitril-valsartan (ENTRESTO) 97-103 MG Take 1 tablet by mouth 2 (two) times daily.  . sodium chloride (OCEAN) 0.65 % SOLN nasal spray Place 1-2 sprays into both nostrils as needed for congestion.  Marland Kitchen spironolactone (ALDACTONE) 25 MG tablet Take 1 tablet (25 mg total) by mouth daily.  Marland Kitchen triamcinolone cream (KENALOG) 0.1 % Apply 1 application topically daily as needed (to affected areas, for itching).   . Wheat Dextrin (BENEFIBER DRINK MIX PO) Take by mouth. Take 2 tablespoons daily  . [DISCONTINUED] gabapentin (NEURONTIN) 100 MG capsule Take 1 capsule (100 mg total) by mouth 3 (three) times daily.  . [DISCONTINUED] hydrALAZINE (APRESOLINE) 25 MG tablet Take 1.5 tablets (37.5 mg total) by mouth 3 (three) times daily.  . [DISCONTINUED] melatonin 5 MG TABS Take 5 mg by mouth at bedtime as needed.  . [DISCONTINUED] traMADol (ULTRAM) 50 MG tablet Take 1 tablet (50 mg total) by mouth every 6 (six) hours as needed.   No facility-administered encounter medications on file as of 06/30/2020.   Allergies  Allergen Reactions  . Lipitor [Atorvastatin] Other (See Comments)    Muscle pain  . Other Rash and Other (See Comments)    Detergent- Rashes on the skin   Patient Active Problem List   Diagnosis Date Noted  . Flu vaccine need 02/24/2020  . Gross hematuria 02/24/2020  . Anemia, iron deficiency, inadequate dietary intake 12/29/2019  . Intrinsic eczema 01/12/2019  . Chronic intractable headache 06/13/2018  . CAD (coronary artery disease) 12/26/2017  . Left ventricular dysfunction 11/06/2017  . Persistent atrial fibrillation 10/02/2017  . Aortic atherosclerosis ([place]) 05/02/2017  . Hypertrophy of nasal turbinates 03/21/2017  . Primary osteoarthritis of both knees 03/13/2016  . Bradycardia 12/15/2015  . Routine general medical examination at a health care facility 08/16/2015  . Middle insomnia 08/15/2015  . Carotid artery disease- s/p RCE June 2013  11/01/2011  . PVC's (premature ventricular contractions) 05/07/2011  . Radiation proctitis 03/30/2011  . Constipation, chronic 02/02/2011  . Cerebrovascular disease 09/08/2008  . Allergic rhinitis 09/08/2008  . Diabetes mellitus without complication ([place]) 63/89/3734  . Prostate cancer ([place]) 09/06/2008  . Hyperlipidemia with target LDL less than 70 07/02/2008  . Essential hypertension 07/02/2008  . GERD 07/02/2008  . Irritable bowel syndrome 07/02/2008  . LOW BACK PAIN SYNDROME 07/02/2008   Social History   Socioeconomic History  . Marital status: Married    Spouse name: Not on file  . Number of children: 2  . Years of education: Not on file  . Highest education level: Not on file  Occupational History  . 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 Smoking status: Former Smoker    Packs/day: 1.50    Years: 60.00    Pack years: 90.00    Types: Cigarettes    Quit date: 07/25/2008    Years since quitting: 11.9  . Smokeless tobacco: Never Used  Vaping Use  .  Vaping Use: Never used  Substance and Sexual Activity  . Alcohol use: Not Currently  . Drug use: No  . Sexual activity: Not Currently  Other Topics Concern  . Not on file  Social History Narrative  . Not on file   Social Determinants of Health   Financial Resource Strain: Not on file  Food Insecurity: No Food Insecurity  . Worried About Charity fundraiser in the Last Year: Never true  . Ran Out of Food in the Last Year: Never true  Transportation Needs: No Transportation Needs  . Lack of Transportation (Medical): No  . Lack of Transportation (Non-Medical): No  Physical Activity: Not on file  Stress: Not on file  Social Connections: Not on file  Intimate Partner Violence: Not on file    Mr. Flewellen's family history includes Asthma in his paternal grandmother; Heart disease in his father; Hypertension in his mother.      Objective:    Vitals:   06/30/20 1122  BP: 140/64  Pulse: 90  SpO2: 99%     Physical Exam Well-developed well-nourished  Elderly WM  in no acute distress.  Height, Weight 177 , BMI 26.2  HEENT; nontraumatic normocephalic, EOMI, PE R LA, sclera anicteric. Oropharynx;not examined Neck; supple, no JVD Cardiovascular; regular rate and rhythm with S1-S2, no murmur rub or gallop Pulmonary; Clear bilaterally Abdomen; soft, nontender, nondistended, no palpable mass or hepatosplenomegaly, bowel sounds are active Rectal;not done Skin; benign exam, no jaundice rash or appreciable lesions Extremities; no clubbing cyanosis or edema skin warm and dry Neuro/Psych; alert and oriented x4, grossly nonfocal mood and affect appropriate       Assessment & Plan:   #71  87 year old white male with 2 episodes of fairly severe upper abdominal pain which occurred postprandially and both over the past 1 week.  Pain described as a hard steady pain lasting for at least 30 minutes and seem to be improved after a dose of Bentyl.  Pain was associated with nausea but no vomiting, no fever or chills, no diarrhea Patient has had no symptoms in between episodes and has felt fine over the past 2 days  I am concerned that he may be having biliary colic with previously documented cholelithiasis on CT.  #2 diverticulosis 3.  Remote history of adenomatous colon polyps-last colonoscopy 2010 no polyps 4.  Diverticulosis 5.  Bladder cancer 6.  Coronary artery disease status post drug-eluting stent 7.  Chronic antiplatelet therapy-on Plavix 8.  History of internal hemorrhoids with prolapse status post previous banding  Plan; patient will be scheduled for upper abdominal ultrasound within the next 1 week. CBC with differential, c-Met and lipase today Patient advised to follow a low-fat diet, avoid greasy heavy foods He was given a prescription for Bentyl to use on a as needed basis, 10 mg every 6 hours. He had also ask for a prescription for hydrocortisone cream for as needed use for hemorrhoids  and this was sent. Patient is asked to call should he have any recurrence of the severe abdominal pain and was advised to proceed to the emergency room if he has a severe episode that does not resolve quickly.   Nazly Digilio Genia Harold PA-C 06/30/2020   Cc: [name], MD

## 2020-06-30 NOTE — Patient Instructions (Signed)
If you are age 85 or older, your body mass index should be between 23-30. Your Body mass index is 26.23 kg/m. If this is out of the aforementioned range listed, please consider follow up with your Primary Care Provider.  If you are age 43 or younger, your body mass index should be between 19-25. Your Body mass index is 26.23 kg/m. If this is out of the aformentioned range listed, please consider follow up with your Primary Care Provider.   You have been scheduled for an abdominal ultrasound at [facility] (1st floor of hospital) on 07/07/2020 at 11:30 am. Please arrive 15 minutes prior to your appointment for registration. Make certain not to have anything to eat or drink 6 hours prior to your appointment. Should you need to reschedule your appointment, please contact radiology at [phone]. This test typically takes about 30 minutes to perform.  Your provider has requested that you go to the basement level for lab work before leaving today. Press "B" on the elevator. The lab is located at the first door on the left as you exit the elevator.  START Dicyclomine 10 mg 1 tablet every 6 hours as needed for abdominal pain  We have also sent in refills of Hydrocortisone cream  Avoid fatty/greasy foods.  Follow up pending at this time.  Thank you for entrusting me with your care and choosing [facility].  Amy Esterwood, PA-C

## 2020-07-05 ENCOUNTER — Ambulatory Visit: Payer: HMO | Admitting: Physician Assistant

## 2020-07-05 DIAGNOSIS — C673 Malignant neoplasm of anterior wall of bladder: Secondary | ICD-10-CM | POA: Diagnosis not present

## 2020-07-05 DIAGNOSIS — Z5111 Encounter for antineoplastic chemotherapy: Secondary | ICD-10-CM | POA: Diagnosis not present

## 2020-07-07 ENCOUNTER — Other Ambulatory Visit: Payer: Self-pay

## 2020-07-07 ENCOUNTER — Ambulatory Visit (HOSPITAL_COMMUNITY)
Admission: RE | Admit: 2020-07-07 | Discharge: 2020-07-07 | Disposition: A | Discharge status: Home / Self Care | Payer: HMO | Source: Ambulatory Visit | Attending: Physician Assistant | Admitting: Physician Assistant

## 2020-07-07 DIAGNOSIS — R110 Nausea: Secondary | ICD-10-CM

## 2020-07-07 DIAGNOSIS — R109 Unspecified abdominal pain: Secondary | ICD-10-CM

## 2020-07-07 DIAGNOSIS — K8050 Calculus of bile duct without cholangitis or cholecystitis without obstruction: Secondary | ICD-10-CM

## 2020-07-07 DIAGNOSIS — N281 Cyst of kidney, acquired: Secondary | ICD-10-CM | POA: Diagnosis not present

## 2020-07-08 MED ORDER — SACUBITRIL-VALSARTAN 97-103 MG PO TABS: 1.0000 | ORAL_TABLET | Freq: Two times a day (BID) | ORAL | 2 refills | Status: DC

## 2020-07-12 DIAGNOSIS — Z5111 Encounter for antineoplastic chemotherapy: Secondary | ICD-10-CM | POA: Diagnosis not present

## 2020-07-12 DIAGNOSIS — C673 Malignant neoplasm of anterior wall of bladder: Secondary | ICD-10-CM | POA: Diagnosis not present

## 2020-07-19 DIAGNOSIS — C673 Malignant neoplasm of anterior wall of bladder: Secondary | ICD-10-CM | POA: Diagnosis not present

## 2020-07-19 DIAGNOSIS — Z5111 Encounter for antineoplastic chemotherapy: Secondary | ICD-10-CM | POA: Diagnosis not present

## 2020-07-26 DIAGNOSIS — C673 Malignant neoplasm of anterior wall of bladder: Secondary | ICD-10-CM | POA: Diagnosis not present

## 2020-07-26 DIAGNOSIS — Z5111 Encounter for antineoplastic chemotherapy: Secondary | ICD-10-CM | POA: Diagnosis not present

## 2020-07-28 ENCOUNTER — Other Ambulatory Visit: Payer: Self-pay

## 2020-07-28 ENCOUNTER — Ambulatory Visit (INDEPENDENT_AMBULATORY_CARE_PROVIDER_SITE_OTHER): Payer: HMO | Admitting: Pharmacist Clinician (PhC)/ Clinical Pharmacy Specialist

## 2020-07-28 DIAGNOSIS — I10 Essential (primary) hypertension: Secondary | ICD-10-CM

## 2020-07-28 NOTE — Progress Notes (Signed)
08/01/2020 Dillon Young 01-Apr-1933 [id number]   HPI:  Dillon Young is a 87 y.o. male patient of Dr [name], with a PMH below who presents today for hypertension clinic evaluation.  He was seen by Dr. [name] in January, at which time his pressure was noted to be 160/54.  He had brought home BP readings with him to the office that day, which showed home readings to be more in the 120-140/60-70 range.  When he saw Dr. [name] at the advanced heart failure clinic a week or so later it was also elevated at 150/70.  At that time he complained of ankle edema, so the amlodipine was discontinued and he was asked to increase the hydralazine to 37.5 mg three times daily.  Shortly after this visit he sent a message in stating that the increased dose of hydralazine caused him to have a gout flare.  He cut the dose back to 25 mg twice daily and reported that his pressure dropped from 165/50 to 94/68, 2 hours after taking the 25 mg dose.  He was asked to continue all meds, and come in to [facility] with home readings as well as his BP cuff.    Today he is in the office for evaluation.  He is feeling well overall and has no specific complaints.   Past Medical History: CHF EF 55-60% by echo 05/2018; on Entresto, carvedilol, spironolactone  ASCVD DES to proximal/mid LAD 12/2017, on carvedilol, Entresto; R endarterectomy 2013  Persistent AF On Tikosyn , CHADS2-VASc score 6 (CHF, HTN, AGE x 2, DM2, CAD), on Eliquis  DM2 12/21 A1c 6.2 - diet controlled;      Blood Pressure Goal:  130/80  Current Medications:  Carvedilol 6.25 mg bid, hydralazine 25 mg bid, Entresto 97/103 mg bid, spironolactone 25 mg qd  Family Hx: father died at 38 from aneurysm, mother at 56 from old age; sisters are healthy at 16 and 73; 2 daughters also healthy  Social Hx: quit tobacco 12 years ago; no alcohol; decaf coffee in the mornings, occasional tea/soda  Diet: wife cooks most meals, enjoys oatmeal with fruit; plenty of vegetables, does  admit to occasional [place]   Exercise: walks each morning 30+ minutes at the gym in a nearby church, at least 4-5 days per week  Home BP readings: no readings with him today, did bring home cuff; it read 16/8 points higher than office cuff  Intolerances: atorvastatin - myalgias  Labs:  2/22: Na 138, K 4.5, Glu 89, BUN 21, SCr 1.17, GFR 56.7   Wt Readings from Last 3 Encounters:  07/28/20 177 lb 6.4 oz (80.5 kg)  06/30/20 177 lb 9.6 oz (80.6 kg)  06/16/20 179 lb 12.8 oz (81.6 kg)   BP Readings from Last 3 Encounters:  07/28/20 (!) 142/80  06/30/20 140/64  06/16/20 (!) 150/70   Pulse Readings from Last 3 Encounters:  07/28/20 68  06/30/20 90  06/16/20 84    Current Outpatient Medications  Medication Sig Dispense Refill  . acetaminophen (TYLENOL) 500 MG tablet Take 500-1,000 mg by mouth every 6 (six) hours as needed (for headaches).    Marland Kitchen apixaban (ELIQUIS) 5 MG TABS tablet Take 5 mg by mouth 2 (two) times daily.    . carvedilol (COREG) 6.25 MG tablet Take 1 tablet (6.25 mg total) by mouth 2 (two) times daily with a meal. 60 tablet 6  . Cholecalciferol (VITAMIN D-3) 25 MCG (1000 UT) CAPS Take 1,000 Units by mouth daily.    Marland Kitchen  clobetasol (TEMOVATE) 0.05 % external solution Apply 1 application topically See admin instructions. Apply to affected areas of the back twice weekly    . dofetilide (TIKOSYN) 125 MCG capsule TAKE 1 CAPSULE BY MOUTH TWICE DAILY 180 capsule 3  . fexofenadine (ALLEGRA) 180 MG tablet Take 180 mg by mouth daily as needed for allergies or rhinitis.     . fluticasone (FLONASE) 50 MCG/ACT nasal spray Place 2 sprays into both nostrils daily as needed for allergies or rhinitis. 48 g 1  . hydrALAZINE (APRESOLINE) 25 MG tablet Take 25 mg by mouth 3 (three) times daily as needed (Take 1.5 tablets by mouth 3 times a day or as needed if bp reaches 160). Take 1.5 tablets by mouth 3 times a day or as needed if bp reaches 160    . hydrocortisone (ANUSOL-HC) 2.5 % rectal  cream Place 1 application rectally 3 (three) times daily. 30 g 2  . Hypromellose (ARTIFICIAL TEARS OP) Place 1 drop into both eyes 4 (four) times daily as needed (dry eyes).    Marland Kitchen ipratropium (ATROVENT) 0.06 % nasal spray Place 2 sprays into both nostrils 4 (four) times daily as needed for rhinitis. 45 mL 1  . levocetirizine (XYZAL) 5 MG tablet Take 5 mg by mouth daily as needed for allergies.    . nitroGLYCERIN (NITROSTAT) 0.4 MG SL tablet Place 1 tablet (0.4 mg total) under the tongue every 5 (five) minutes as needed for chest pain. 25 tablet 3  . rosuvastatin (CRESTOR) 10 MG tablet TAKE 1 TABLET (10 MG TOTAL) BY MOUTH DAILY. NEEDS APPT FOR FUTURE REFILLS 90 tablet 2  . sacubitril-valsartan (ENTRESTO) 97-103 MG Take 1 tablet by mouth 2 (two) times daily. 180 tablet 2  . sodium chloride (OCEAN) 0.65 % SOLN nasal spray Place 1-2 sprays into both nostrils as needed for congestion.    Marland Kitchen spironolactone (ALDACTONE) 25 MG tablet Take 1 tablet (25 mg total) by mouth daily. 90 tablet 3  . triamcinolone cream (KENALOG) 0.1 % Apply 1 application topically daily as needed (to affected areas, for itching).     . Wheat Dextrin (BENEFIBER DRINK MIX PO) Take by mouth. Take 2 tablespoons daily     No current facility-administered medications for this visit.    Allergies  Allergen Reactions  . Lipitor [Atorvastatin] Other (See Comments)    Muscle pain  . Other Rash and Other (See Comments)    Detergent- Rashes on the skin    Past Medical History:  Diagnosis Date  . Adenomatous colon polyp   . Allergic rhinitis   . Anemia   . Anxiety   . BPH (benign prostatic hyperplasia)   . CAD (coronary artery disease)    sees Dr. [name]   . Carotid artery disease ([place])   . CHF (congestive heart failure) ([place])   . Chronic kidney disease    Sones. Prostate cancer  . Clotting disorder ([place])   . CVA (cerebral infarction)   . Diabetes mellitus    "Borderline"  . Diverticulitis   . Dysrhythmia    atrial  fib - hx of   . Edema    recent swelling in feet and ankles   . GERD (gastroesophageal reflux disease)    history  . Headache   . Hemorrhoids   . History of kidney stones   . HTN (hypertension)   . Hx of radiation therapy    for prostate - 2013   . Hyperlipidemia LDL goal <70   . IBS (irritable  bowel syndrome)   . Low back pain   . Pre-diabetes   . Prostate cancer ([place]) 2008   seed implantation  . Sleep apnea    sitting in recliner helps him to breathe per pt , never used cpap sleep study - pt reports could not go to sleep- 6 years ago   . Smoker   . Stroke [facility])    hx of mini strokes years ago     Blood pressure (!) 142/80, pulse 68, resp. rate 14, height 5\' 9"  (1.753 m), weight 177 lb 6.4 oz (80.5 kg), SpO2 98 %.  Essential hypertension Patient with essential hypertension, still not to goal.  His home cuff read significantly higher than office reading today.  Office reading still elevated at 142/80.  While the hydralazine should have no bearing on gout, he is rather hesitant, so will leave that at 25 mg bid.  Instead will increase the carvedilol to 12.5 mg twice daily.  He was asked to continue checking home readings, but use a different meter (he has 3 at home).  He will bring that meter, along with readings, to his next appointment in 4 weeks.    Tommy Medal PharmD CPP [facility] [address] [place], [place] [address] [phone]

## 2020-07-28 NOTE — Patient Instructions (Signed)
Return for a appointment March 31 at 9:30 am  Check your blood pressure at home daily (if able) and keep record of the readings.  Take your BP meds as follows:  Increase carvedilol to 1.5 tablets twice daily   Continue with all other medications  Bring all of your meds, your BP cuff and your record of home blood pressures to your next appointment.  Exercise as you're able, try to walk approximately 30 minutes per day.  Keep salt intake to a minimum, especially watch canned and prepared boxed foods.  Eat more fresh fruits and vegetables and fewer canned items.  Avoid eating in fast food restaurants.    HOW TO TAKE YOUR BLOOD PRESSURE: . Rest 5 minutes before taking your blood pressure. .  Don't smoke or drink caffeinated beverages for at least 30 minutes before. . Take your blood pressure before (not after) you eat. . Sit comfortably with your back supported and both feet on the floor (don't cross your legs). . Elevate your arm to heart level on a table or a desk. . Use the proper sized cuff. It should fit smoothly and snugly around your bare upper arm. There should be enough room to slip a fingertip under the cuff. The bottom edge of the cuff should be 1 inch above the crease of the elbow. . Ideally, take 3 measurements at one sitting and record the average.

## 2020-08-01 NOTE — Assessment & Plan Note (Signed)
Patient with essential hypertension, still not to goal.  His home cuff read significantly higher than office reading today.  Office reading still elevated at 142/80.  While the hydralazine should have no bearing on gout, he is rather hesitant, so will leave that at 25 mg bid.  Instead will increase the carvedilol to 12.5 mg twice daily.  He was asked to continue checking home readings, but use a different meter (he has 3 at home).  He will bring that meter, along with readings, to his next appointment in 4 weeks.

## 2020-08-02 DIAGNOSIS — Z5111 Encounter for antineoplastic chemotherapy: Secondary | ICD-10-CM | POA: Diagnosis not present

## 2020-08-02 DIAGNOSIS — C673 Malignant neoplasm of anterior wall of bladder: Secondary | ICD-10-CM | POA: Diagnosis not present

## 2020-08-04 MED ORDER — CARVEDILOL 6.25 MG PO TABS: 9.3750 mg | ORAL_TABLET | Freq: Two times a day (BID) | ORAL | 3 refills | Status: DC

## 2020-08-04 NOTE — Telephone Encounter (Signed)
Spoke with pt for lengthy period of time and pt picked up Carvedilol 6.25 mg today and was confused on strength Per pt was taking Carvedilol 3.125 mg 1 1/2 tab twice daily has concerns re increasing to 6.25 mg 1 1/2 tab bid Per pt B/P is ranging 108-136/56-71 and HR rate ranging 46-81 with most readings in the 50 and 60 range Will forward to [employer] for review ./cy

## 2020-08-04 NOTE — Telephone Encounter (Signed)
Error in last office not plan - increase carvedilol to 9.375 mg bid not 12.5 mg.  Patient aware of correct dose and prescription sent to pharmacy with correct information.  He notes that since increasing to the 9.375 his home BP is doing well

## 2020-08-05 ENCOUNTER — Other Ambulatory Visit: Payer: Self-pay

## 2020-08-05 MED ORDER — SACUBITRIL-VALSARTAN 97-103 MG PO TABS: 1.0000 | ORAL_TABLET | Freq: Two times a day (BID) | ORAL | 3 refills | Status: DC

## 2020-08-12 ENCOUNTER — Other Ambulatory Visit: Payer: Self-pay | Admitting: Cardiovascular Disease

## 2020-08-15 ENCOUNTER — Telehealth: Payer: Self-pay | Admitting: Internal Medicine

## 2020-08-15 DIAGNOSIS — H524 Presbyopia: Secondary | ICD-10-CM | POA: Diagnosis not present

## 2020-08-15 DIAGNOSIS — H0100A Unspecified blepharitis right eye, upper and lower eyelids: Secondary | ICD-10-CM | POA: Diagnosis not present

## 2020-08-15 DIAGNOSIS — H26491 Other secondary cataract, right eye: Secondary | ICD-10-CM | POA: Diagnosis not present

## 2020-08-15 DIAGNOSIS — R7303 Prediabetes: Secondary | ICD-10-CM | POA: Diagnosis not present

## 2020-08-15 LAB — HM DIABETES EYE EXAM

## 2020-08-15 NOTE — Progress Notes (Signed)
  Chronic Care Management   Note  08/15/2020 Name: Dillon Young MRN: [id number] DOB: 1932/10/08  Dillon Young is a 87 y.o. year old male who is a primary care patient of [name], MD. I reached out to [name] by phone today in response to a referral sent by Dillon Young's PCP, [name], MD.   [name] was given information about Chronic Care Management services today including:  1. CCM service includes personalized support from designated clinical staff supervised by his physician, including individualized plan of care and coordination with other care providers 2. 24/7 contact phone numbers for assistance for urgent and routine care needs. 3. Service will only be billed when office clinical staff spend 20 minutes or more in a month to coordinate care. 4. Only one practitioner may furnish and bill the service in a calendar month. 5. The patient may stop CCM services at any time (effective at the end of the month) by phone call to the office staff.   Patient agreed to services and verbal consent obtained.   Follow up plan:   Carley Perdue [employer] Scheduler

## 2020-08-17 DIAGNOSIS — R350 Frequency of micturition: Secondary | ICD-10-CM | POA: Diagnosis not present

## 2020-08-17 DIAGNOSIS — C678 Malignant neoplasm of overlapping sites of bladder: Secondary | ICD-10-CM | POA: Diagnosis not present

## 2020-08-17 DIAGNOSIS — R3915 Urgency of urination: Secondary | ICD-10-CM | POA: Diagnosis not present

## 2020-08-17 DIAGNOSIS — R8279 Other abnormal findings on microbiological examination of urine: Secondary | ICD-10-CM | POA: Diagnosis not present

## 2020-08-17 DIAGNOSIS — N3945 Continuous leakage: Secondary | ICD-10-CM | POA: Diagnosis not present

## 2020-08-17 DIAGNOSIS — R300 Dysuria: Secondary | ICD-10-CM | POA: Diagnosis not present

## 2020-08-17 DIAGNOSIS — R21 Rash and other nonspecific skin eruption: Secondary | ICD-10-CM | POA: Diagnosis not present

## 2020-08-25 ENCOUNTER — Other Ambulatory Visit: Payer: Self-pay

## 2020-08-25 ENCOUNTER — Ambulatory Visit (INDEPENDENT_AMBULATORY_CARE_PROVIDER_SITE_OTHER): Payer: HMO | Admitting: Pharmacist Clinician (PhC)/ Clinical Pharmacy Specialist

## 2020-08-25 DIAGNOSIS — I10 Essential (primary) hypertension: Secondary | ICD-10-CM

## 2020-08-25 NOTE — Progress Notes (Signed)
08/25/2020 KIMONI PAGLIARULO 20-Dec-1932 [id number]   HPI:  Dillon Young is a 87 y.o. male patient of Dr [name], with a PMH below who presents today for hypertension clinic evaluation.  He was seen by Dr. [name] in January, at which time his pressure was noted to be 160/54.  He had brought home BP readings with him to the office that day, which showed home readings to be more in the 120-140/60-70 range.  When he saw Dr. [name] at the advanced heart failure clinic a week or so later it was also elevated at 150/70.  At that time he complained of ankle edema, so the amlodipine was discontinued and he was asked to increase the hydralazine to 37.5 mg three times daily.  Shortly after this visit he sent a message in stating that the increased dose of hydralazine caused him to have a gout flare.  He cut the dose back to 25 mg twice daily and reported that his pressure dropped from 165/50 to 94/68, 2 hours after taking the 25 mg dose.  When we saw him last month we left the hydralazine at 25 mg bid (it is not known to affect gout) and increased the carvedilol to 12.5 mg bid.  He was asked to continue monitoring home BP readings and bring the home cuff, along with list of home values, to the office today.    Today he is in the office for evaluation.  He is feeling well overall and has no specific complaints.  He takes the hydralazine only for systolic BP > 458 and has not needed any since his last visit.  His highest reading was 099 systolic and he is aware that this comes soon after eating pizza.  He knows that he is somewhat salt sensitive.   Past Medical History: CHF EF 55-60% by echo 05/2018; on Entresto, carvedilol, spironolactone  ASCVD DES to proximal/mid LAD 12/2017, on carvedilol, Entresto; R endarterectomy 2013  Persistent AF On Tikosyn , CHADS2-VASc score 6 (CHF, HTN, AGE x 2, DM2, CAD), on Eliquis  DM2 12/21 A1c 6.2 - diet controlled;      Blood Pressure Goal:  130/80  Current Medications:   Carvedilol 12.5 mg bid, Entresto 97/103 mg bid, spironolactone 25 mg qd  Family Hx: father died at 39 from aneurysm, mother at 3 from old age; sisters are healthy at 107 and 50; 2 daughters also healthy  Social Hx: quit tobacco 12 years ago; no alcohol; decaf coffee in the mornings, occasional tea/soda  Diet: wife cooks most meals, enjoys oatmeal with fruit; plenty of vegetables, does admit to occasional [place]   Exercise: walks each morning 30+ minutes at the gym in a nearby church, at least 4-5 days per week  Home BP readings: home cuff [place] read >10 points higher on diastolic, but systolic was ~6 pts different.  21 AM readings average 123/62 HR 54  18 PM readings average 136/63 HR 55  Intolerances: atorvastatin - myalgias  Labs:  2/22: Na 138, K 4.5, Glu 89, BUN 21, SCr 1.17, GFR 56.7   Wt Readings from Last 3 Encounters:  08/25/20 180 lb (81.6 kg)  07/28/20 177 lb 6.4 oz (80.5 kg)  06/30/20 177 lb 9.6 oz (80.6 kg)   BP Readings from Last 3 Encounters:  08/25/20 132/66  07/28/20 (!) 142/80  06/30/20 140/64   Pulse Readings from Last 3 Encounters:  08/25/20 66  07/28/20 68  06/30/20 90    Current Outpatient Medications  Medication  Sig Dispense Refill  . acetaminophen (TYLENOL) 500 MG tablet Take 500-1,000 mg by mouth every 6 (six) hours as needed (for headaches).    Marland Kitchen apixaban (ELIQUIS) 5 MG TABS tablet Take 5 mg by mouth 2 (two) times daily.    . carvedilol (COREG) 6.25 MG tablet Take 1.5 tablets (9.375 mg total) by mouth 2 (two) times daily. 270 tablet 3  . Cholecalciferol (VITAMIN D-3) 25 MCG (1000 UT) CAPS Take 1,000 Units by mouth daily.    . clobetasol (TEMOVATE) 0.05 % external solution Apply 1 application topically See admin instructions. Apply to affected areas of the back twice weekly    . dofetilide (TIKOSYN) 125 MCG capsule TAKE 1 CAPSULE BY MOUTH TWICE DAILY 180 capsule 3  . fexofenadine (ALLEGRA) 180 MG tablet Take 180 mg by mouth daily as needed for  allergies or rhinitis.     . fluticasone (FLONASE) 50 MCG/ACT nasal spray Place 2 sprays into both nostrils daily as needed for allergies or rhinitis. 48 g 1  . hydrALAZINE (APRESOLINE) 25 MG tablet Take 25 mg by mouth 3 (three) times daily as needed (Take 1.5 tablets by mouth 3 times a day or as needed if bp reaches 160). Take 1.5 tablets by mouth 3 times a day or as needed if bp reaches 160    . hydrocortisone (ANUSOL-HC) 2.5 % rectal cream Place 1 application rectally 3 (three) times daily. 30 g 2  . Hypromellose (ARTIFICIAL TEARS OP) Place 1 drop into both eyes 4 (four) times daily as needed (dry eyes).    Marland Kitchen ipratropium (ATROVENT) 0.06 % nasal spray Place 2 sprays into both nostrils 4 (four) times daily as needed for rhinitis. 45 mL 1  . levocetirizine (XYZAL) 5 MG tablet Take 5 mg by mouth daily as needed for allergies.    . nitroGLYCERIN (NITROSTAT) 0.4 MG SL tablet Place 1 tablet (0.4 mg total) under the tongue every 5 (five) minutes as needed for chest pain. 25 tablet 3  . rosuvastatin (CRESTOR) 10 MG tablet TAKE 1 TABLET (10 MG TOTAL) BY MOUTH DAILY. NEEDS APPT FOR FUTURE REFILLS 90 tablet 0  . sacubitril-valsartan (ENTRESTO) 97-103 MG Take 1 tablet by mouth 2 (two) times daily. 180 tablet 3  . sodium chloride (OCEAN) 0.65 % SOLN nasal spray Place 1-2 sprays into both nostrils as needed for congestion.    Marland Kitchen spironolactone (ALDACTONE) 25 MG tablet Take 1 tablet (25 mg total) by mouth daily. 90 tablet 3  . triamcinolone cream (KENALOG) 0.1 % Apply 1 application topically daily as needed (to affected areas, for itching).     . Wheat Dextrin (BENEFIBER DRINK MIX PO) Take by mouth. Take 2 tablespoons daily     No current facility-administered medications for this visit.    Allergies  Allergen Reactions  . Lipitor [Atorvastatin] Other (See Comments)    Muscle pain  . Other Rash and Other (See Comments)    Detergent- Rashes on the skin    Past Medical History:  Diagnosis Date  .  Adenomatous colon polyp   . Allergic rhinitis   . Anemia   . Anxiety   . BPH (benign prostatic hyperplasia)   . CAD (coronary artery disease)    sees Dr. [name]   . Carotid artery disease ([place])   . CHF (congestive heart failure) ([place])   . Chronic kidney disease    Sones. Prostate cancer  . Clotting disorder ([place])   . CVA (cerebral infarction)   . Diabetes mellitus    "  Borderline"  . Diverticulitis   . Dysrhythmia    atrial fib - hx of   . Edema    recent swelling in feet and ankles   . GERD (gastroesophageal reflux disease)    history  . Headache   . Hemorrhoids   . History of kidney stones   . HTN (hypertension)   . Hx of radiation therapy    for prostate - 2013   . Hyperlipidemia LDL goal <70   . IBS (irritable bowel syndrome)   . Low back pain   . Pre-diabetes   . Prostate cancer ([place]) 2008   seed implantation  . Sleep apnea    sitting in recliner helps him to breathe per pt , never used cpap sleep study - pt reports could not go to sleep- 6 years ago   . Smoker   . Stroke [facility])    hx of mini strokes years ago     Blood pressure 132/66, pulse 66, resp. rate 16, height 5\' 9"  (1.753 m), weight 180 lb (81.6 kg), SpO2 96 %.  Essential hypertension Patient with HFrEF and hypertension now better controlled on combination of carvedilol, Entresto and spironolactone.  Will have him continue with these medications for now.  He is to continue with home BP checks at least 3 days each week and will keep follow up appointments with Dr. [name] and Dr. [name].   We can see him in the future should he have more challenges with his blood pressure.     Tommy Medal PharmD CPP [facility] [address] [place], [place] [address] [phone]

## 2020-08-25 NOTE — Assessment & Plan Note (Signed)
Patient with HFrEF and hypertension now better controlled on combination of carvedilol, Entresto and spironolactone.  Will have him continue with these medications for now.  He is to continue with home BP checks at least 3 days each week and will keep follow up appointments with Dr. [name] and Dr. [name].   We can see him in the future should he have more challenges with his blood pressure.

## 2020-08-25 NOTE — Patient Instructions (Addendum)
Return for a a follow up appointment with Dr. [name]  Check your blood pressure at home 3-4 times each week and keep record of the readings.  Take your BP meds as follows:   Continue with all current medications  Bring all of your meds, your BP cuff and your record of home blood pressures to your next appointment.  Exercise as you're able, try to walk approximately 30 minutes per day.  Keep salt intake to a minimum, especially watch canned and prepared boxed foods.  Eat more fresh fruits and vegetables and fewer canned items.  Avoid eating in fast food restaurants.    HOW TO TAKE YOUR BLOOD PRESSURE: . Rest 5 minutes before taking your blood pressure. .  Don't smoke or drink caffeinated beverages for at least 30 minutes before. . Take your blood pressure before (not after) you eat. . Sit comfortably with your back supported and both feet on the floor (don't cross your legs). . Elevate your arm to heart level on a table or a desk. . Use the proper sized cuff. It should fit smoothly and snugly around your bare upper arm. There should be enough room to slip a fingertip under the cuff. The bottom edge of the cuff should be 1 inch above the crease of the elbow. Ideally, take 3 measurements at one sitting and record the average.    Thank you for choosing [facility]

## 2020-09-07 ENCOUNTER — Other Ambulatory Visit: Payer: Self-pay | Admitting: Cardiovascular Disease

## 2020-09-15 ENCOUNTER — Ambulatory Visit (INDEPENDENT_AMBULATORY_CARE_PROVIDER_SITE_OTHER): Payer: HMO

## 2020-09-15 ENCOUNTER — Other Ambulatory Visit: Payer: Self-pay

## 2020-09-15 VITALS — BP 104/60 | HR 83 | Temp 98.3°F | Resp 16 | Ht 69.0 in | Wt 178.6 lb

## 2020-09-15 DIAGNOSIS — Z0000 Encounter for general adult medical examination without abnormal findings: Secondary | ICD-10-CM | POA: Diagnosis not present

## 2020-09-15 NOTE — Progress Notes (Signed)
Subjective:   Dillon Young is a 87 y.o. male who presents for Medicare Annual/Subsequent preventive examination.  Review of Systems   No ROS. Medicare Wellness Visit. Additional risk factors are reflected in social history. Cardiac Risk Factors include: advanced age (>4men, >36 women);diabetes mellitus;dyslipidemia;hypertension;family history of premature cardiovascular disease;male gender     Objective:    Today's Vitals   09/15/20 1447  BP: 104/60  Pulse: 83  Resp: 16  Temp: 98.3 F (36.8 C)  SpO2: 96%  Weight: 178 lb 9.6 oz (81 kg)  Height: 5\' 9"  (1.753 m)  PainSc: 0-No pain   Body mass index is 26.37 kg/m.  Advanced Directives 09/15/2020 05/31/2020 05/23/2020 05/05/2020 12/31/2019 06/25/2019 09/24/2018  Does Patient Have a Medical Advance Directive? Yes Yes Yes Yes No Yes Yes  Type of Advance Directive Living will;Healthcare Power of [place];Living will [place];Living will [place];Living will - - -  Does patient want to make changes to medical advance directive? No - Patient declined - - - - No - Patient declined No - Patient declined  Copy of [place] in Chart? No - copy requested - - Yes - validated most recent copy scanned in chart (See row information) - - -  Would patient like information on creating a medical advance directive? - - - - No - Patient declined - -  Pre-existing out of facility DNR order (yellow form or pink MOST form) - - - - - - -    Current Medications (verified) Outpatient Encounter Medications as of 09/15/2020  Medication Sig  . acetaminophen (TYLENOL) 500 MG tablet Take 500-1,000 mg by mouth every 6 (six) hours as needed (for headaches).  Marland Kitchen apixaban (ELIQUIS) 5 MG TABS tablet Take 5 mg by mouth 2 (two) times daily.  . carvedilol (COREG) 6.25 MG tablet Take 1.5 tablets (9.375 mg total) by mouth 2 (two) times daily.  . Cholecalciferol (VITAMIN D-3) 25 MCG  (1000 UT) CAPS Take 1,000 Units by mouth daily.  . clobetasol (TEMOVATE) 0.05 % external solution Apply 1 application topically See admin instructions. Apply to affected areas of the back twice weekly  . dofetilide (TIKOSYN) 125 MCG capsule TAKE 1 CAPSULE BY MOUTH TWICE DAILY  . fexofenadine (ALLEGRA) 180 MG tablet Take 180 mg by mouth daily as needed for allergies or rhinitis.   . fluticasone (FLONASE) 50 MCG/ACT nasal spray Place 2 sprays into both nostrils daily as needed for allergies or rhinitis.  . hydrALAZINE (APRESOLINE) 25 MG tablet Take 25 mg by mouth 3 (three) times daily as needed (Take 1.5 tablets by mouth 3 times a day or as needed if bp reaches 160). Take 1.5 tablets by mouth 3 times a day or as needed if bp reaches 160  . hydrocortisone (ANUSOL-HC) 2.5 % rectal cream Place 1 application rectally 3 (three) times daily.  . Hypromellose (ARTIFICIAL TEARS OP) Place 1 drop into both eyes 4 (four) times daily as needed (dry eyes).  Marland Kitchen ipratropium (ATROVENT) 0.06 % nasal spray Place 2 sprays into both nostrils 4 (four) times daily as needed for rhinitis.  Marland Kitchen levocetirizine (XYZAL) 5 MG tablet Take 5 mg by mouth daily as needed for allergies.  . nitroGLYCERIN (NITROSTAT) 0.4 MG SL tablet Place 1 tablet (0.4 mg total) under the tongue every 5 (five) minutes as needed for chest pain.  . rosuvastatin (CRESTOR) 10 MG tablet TAKE 1 TABLET (10 MG TOTAL) BY MOUTH DAILY. NEEDS APPT  FOR FUTURE REFILLS  . sacubitril-valsartan (ENTRESTO) 97-103 MG Take 1 tablet by mouth 2 (two) times daily.  . sodium chloride (OCEAN) 0.65 % SOLN nasal spray Place 1-2 sprays into both nostrils as needed for congestion.  Marland Kitchen spironolactone (ALDACTONE) 25 MG tablet Take 1 tablet (25 mg total) by mouth daily.  Marland Kitchen triamcinolone cream (KENALOG) 0.1 % Apply 1 application topically daily as needed (to affected areas, for itching).   . Wheat Dextrin (BENEFIBER DRINK MIX PO) Take by mouth. Take 2 tablespoons daily   No  facility-administered encounter medications on file as of 09/15/2020.    Allergies (verified) Lipitor [atorvastatin] and Other   History: Past Medical History:  Diagnosis Date  . Adenomatous colon polyp   . Allergic rhinitis   . Anemia   . Anxiety   . BPH (benign prostatic hyperplasia)   . CAD (coronary artery disease)    sees Dr. [name]   . Carotid artery disease ([place])   . CHF (congestive heart failure) ([place])   . Chronic kidney disease    [place]. Prostate cancer  . Clotting disorder ([place])   . CVA (cerebral infarction)   . Diabetes mellitus    "Borderline"  . Diverticulitis   . Dysrhythmia    atrial fib - hx of   . Edema    recent swelling in feet and ankles   . GERD (gastroesophageal reflux disease)    history  . Headache   . Hemorrhoids   . History of kidney stones   . HTN (hypertension)   . Hx of radiation therapy    for prostate - 2013   . Hyperlipidemia LDL goal <70   . IBS (irritable bowel syndrome)   . Low back pain   . Pre-diabetes   . Prostate cancer ([place]) 2008   seed implantation  . Sleep apnea    sitting in recliner helps him to breathe per pt , never used cpap sleep study - pt reports could not go to sleep- 6 years ago   . Smoker   . Stroke [facility])    hx of mini strokes years ago    Past Surgical History:  Procedure Laterality Date  . APPENDECTOMY    . CARDIAC CATHETERIZATION N/A 01/16/2016   Procedure: Left Heart Cath and Coronary Angiography;  Surgeon: Jettie Booze, MD;  Location: [facility];  Service: Cardiovascular;  Laterality: N/A;  . CARDIAC CATHETERIZATION N/A 01/16/2016   Procedure: Coronary Stent Intervention;  Surgeon: Jettie Booze, MD;  Location: [facility];  Service: Cardiovascular;  Laterality: N/A;  . CARDIOVERSION N/A 11/26/2017   Procedure: CARDIOVERSION;  Surgeon: [name], MD;  Location: [facility] ENDOSCOPY;  Service: Cardiovascular;  Laterality: N/A;  . CARDIOVERSION N/A 03/13/2018   Procedure:  CARDIOVERSION;  Surgeon: [name], MD;  Location: [facility] ENDOSCOPY;  Service: Cardiovascular;  Laterality: N/A;  . CATARACT EXTRACTION W/ INTRAOCULAR LENS  IMPLANT, BILATERAL Bilateral   . COLONOSCOPY  10/21/2008   diverticulosis, radiation proctitis, internal hemorrhoids  . CORONARY ANGIOPLASTY    . CORONARY STENT INTERVENTION N/A 12/26/2017   Procedure: CORONARY STENT INTERVENTION;  Surgeon: [name], MD;  Location: [place];  Service: Cardiovascular;  Laterality: N/A;  . ENDARTERECTOMY  11/12/2011   Procedure: ENDARTERECTOMY CAROTID;  Surgeon: Rosetta Posner, MD;  Location: [facility] OR;  Service: Vascular;  Laterality: Right;  . ESOPHAGOGASTRODUODENOSCOPY (EGD) WITH ESOPHAGEAL DILATION    . HEMORRHOID BANDING    . LEFT HEART CATH AND  CORONARY ANGIOGRAPHY N/A 12/26/2017   Procedure: LEFT HEART CATH AND CORONARY ANGIOGRAPHY;  Surgeon: [name], MD;  Location: [place] [facility];  Service: Cardiovascular;  Laterality: N/A;  . LITHOTRIPSY  X 3  . LUMBAR LAMINECTOMY    . TRANSURETHRAL RESECTION OF PROSTATE  10/2003  . TRANSURETHRAL RESECTION OF PROSTATE N/A 05/31/2020   Procedure: CYSTOSCOPY TRANSURETHRAL RESECTION BIOPSY AND FULGURATION OF PROSTATE LESION  (TURP);  Surgeon: Irine Seal, MD;  Location: [facility]S;  Service: Urology;  Laterality: N/A;   Family History  Problem Relation Age of Onset  . Heart disease Father   . Hypertension Mother   . Asthma Paternal Grandmother   . Colon cancer Neg Hx   . Esophageal cancer Neg Hx   . Stomach cancer Neg Hx   . Rectal cancer Neg Hx   . Liver cancer Neg Hx    Social History   Socioeconomic History  . Marital status: Married    Spouse name: Not on file  . Number of children: 2  . Years of education: Not on file  . Highest education level: Not on file  Occupational History  . 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 Smoking status: Former Smoker    Packs/day: 1.50    Years: 60.00    Pack years: 90.00     Types: Cigarettes    Quit date: 07/25/2008    Years since quitting: 12.1  . Smokeless tobacco: Never Used  Vaping Use  . Vaping Use: Never used  Substance and Sexual Activity  . Alcohol use: Not Currently  . Drug use: No  . Sexual activity: Not Currently  Other Topics Concern  . Not on file  Social History Narrative  . Not on file   Social Determinants of Health   Financial Resource Strain: Low Risk   . Difficulty of Paying Living Expenses: Not hard at all  Food Insecurity: No Food Insecurity  . Worried About Charity fundraiser in the Last Year: Never true  . Ran Out of Food in the Last Year: Never true  Transportation Needs: No Transportation Needs  . Lack of Transportation (Medical): No  . Lack of Transportation (Non-Medical): No  Physical Activity: Sufficiently Active  . Days of Exercise per Week: 5 days  . Minutes of Exercise per Session: 30 min  Stress: No Stress Concern Present  . Feeling of Stress : Not at all  Social Connections: Socially Integrated  . Frequency of Communication with Friends and Family: More than three times a week  . Frequency of Social Gatherings with Friends and Family: More than three times a week  . Attends Religious Services: More than 4 times per year  . Active Member of Clubs or Organizations: Yes  . Attends Archivist Meetings: More than 4 times per year  . Marital Status: Married    Tobacco Counseling Counseling given: Not Answered   Clinical Intake:  Pre-visit preparation completed: Yes  Pain : No/denies pain Pain Score: 0-No pain     BMI - recorded: 26.37 Nutritional Status: BMI 25 -29 Overweight Nutritional Risks: None Diabetes: Yes CBG done?: No Did pt. bring in CBG monitor from home?: No  How often do you need to have someone help you when you read instructions, pamphlets, or other written materials from your doctor or pharmacy?: 1 - Never What is the last grade level you completed in school?: [employer]; 18 months of [employer]  Diabetic? yes  Interpreter  Needed?: No  Information entered by :: Lisette Abu, LPN   Activities of Daily Living In your present state of health, do you have any difficulty performing the following activities: 09/15/2020 05/05/2020  Hearing? N N  Vision? N N  Difficulty concentrating or making decisions? N Y  Comment - Short term memory  Walking or climbing stairs? N N  Dressing or bathing? N N  Doing errands, shopping? N N  Preparing Food and eating ? N -  Using the Toilet? N -  In the past six months, have you accidently leaked urine? N -  Do you have problems with loss of bowel control? N -  Managing your Medications? N -  Managing your Finances? N -  Housekeeping or managing your Housekeeping? N -  Some recent data might be hidden    Patient Care Team: [name], MD as PCP - General (Internal Medicine) [name], MD as PCP - Cardiology (Cardiology) Charlton Haws, [facility] as Pharmacist (Pharmacist)  Indicate any recent Medical Services you may have received from other than [facility] providers in the past year (date may be approximate).     Assessment:   This is a routine wellness examination for Dillon Young.  Hearing/Vision screen No exam data present  Dietary issues and exercise activities discussed: Current Exercise Habits: Home exercise routine;Structured exercise class, Type of exercise: walking;treadmill;stretching;strength training/weights, Time (Minutes): 30, Frequency (Times/Week): 5, Weekly Exercise (Minutes/Week): 150, Intensity: Moderate, Exercise limited by: cardiac condition(s)  Goals   None    Depression Screen PHQ 2/9 Scores 09/15/2020 12/14/2019 06/25/2019 02/23/2019 09/24/2018 04/25/2017 01/25/2016  PHQ - 2 Score 0 0 0 0 0 0 0    Fall Risk Fall Risk  09/15/2020 04/17/2019 04/25/2017 01/25/2016 08/16/2015  Falls in the past year? 0 0 No No No  Comment - [place] Telephone Survey: data to providers prior  to load - - -  Number falls in past yr: 0 - - - -  Injury with Fall? 0 - - - -  Risk for fall due to : No Fall Risks - - - -  Follow up Falls evaluation completed - - - -    FALL RISK PREVENTION PERTAINING TO THE HOME:  Any stairs in or around the home? No  If so, are there any without handrails? No  Home free of loose throw rugs in walkways, pet beds, electrical cords, etc? Yes  Adequate lighting in your home to reduce risk of falls? Yes   ASSISTIVE DEVICES UTILIZED TO PREVENT FALLS:  Life alert? No  Use of a cane, walker or w/c? No  Grab bars in the bathroom? Yes  Shower chair or bench in shower? No  Elevated toilet seat or a handicapped toilet? No   TIMED UP AND GO:  Was the test performed? No .  Length of time to ambulate 10 feet: 0 sec.   Gait steady and fast without use of assistive device  Cognitive Function: Normal cognitive status assessed by direct observation by this Nurse Health Advisor. No abnormalities found.          Immunizations Immunization History  Administered Date(s) Administered  . Fluad Quad(high Dose 65+) 02/27/2019, 02/23/2020  . Influenza Split 03/09/2011, 03/06/2012, 03/11/2013  . Influenza Whole 03/15/2009, 03/17/2010  . Influenza, High Dose Seasonal PF 03/08/2016, 03/19/2017, 03/19/2018  . Influenza,inj,Quad PF,6+ Mos 03/19/2014  . Influenza-Unspecified 02/26/2015  . PFIZER(Purple Top)SARS-COV-2 Vaccination 06/07/2019, 06/28/2019  . Pneumococcal Conjugate-13 08/15/2015  . Pneumococcal Polysaccharide-23 05/07/2011, 02/23/2020  . Tdap  05/06/2012  . Zoster 08/20/2012  . Zoster Recombinat (Shingrix) 01/23/2019, 04/14/2019    TDAP status: Up to date  Flu Vaccine status: Up to date  Pneumococcal vaccine status: Up to date  Covid-19 vaccine status: Completed vaccines  Qualifies for Shingles Vaccine? Yes   Zostavax completed Yes   Shingrix Completed?: Yes  Screening Tests Health Maintenance  Topic Date Due  . FOOT EXAM   01/12/2020  . INFLUENZA VACCINE  12/26/2020  . COVID-19 Vaccine (4 - Booster for [place] series) 03/15/2021  . OPHTHALMOLOGY EXAM  08/15/2021  . TETANUS/TDAP  05/06/2022  . PNA vac Low Risk Adult  Completed  . HPV VACCINES  Aged Out    Health Maintenance  Health Maintenance Due  Topic Date Due  . FOOT EXAM  01/12/2020    Colorectal cancer screening: No longer required.   Lung Cancer Screening: (Low Dose CT Chest recommended if Age 39-80 years, 30 pack-year currently smoking OR have quit w/in 15years.) does not qualify.   Lung Cancer Screening Referral: no  Additional Screening:  Hepatitis C Screening: does not qualify; Completed no  Vision Screening: Recommended annual ophthalmology exams for early detection of glaucoma and other disorders of the eye. Is the patient up to date with their annual eye exam?  Yes  Who is the provider or what is the name of the office in which the patient attends annual eye exams? Marygrace Drought, MD. If pt is not established with a provider, would they like to be referred to a provider to establish care? No .   Dental Screening: Recommended annual dental exams for proper oral hygiene  Community Resource Referral / Chronic Care Management: CRR required this visit?  No   CCM required this visit?  No      Plan:     I have personally reviewed and noted the following in the patient's chart:   . Medical and social history . Use of alcohol, tobacco or illicit drugs  . Current medications and supplements . Functional ability and status . Nutritional status . Physical activity . Advanced directives . List of other physicians . Hospitalizations, surgeries, and ER visits in previous 12 months . Vitals . Screenings to include cognitive, depression, and falls . Referrals and appointments  In addition, I have reviewed and discussed with patient certain preventive protocols, quality metrics, and best practice recommendations. A written  personalized care plan for preventive services as well as general preventive health recommendations were provided to patient.     Sheral Flow, LPN   [id number]/[id number]   Nurse Notes:  Medications reviewed with patient; no opioid use noted.

## 2020-09-15 NOTE — Patient Instructions (Signed)
Dillon Young , Thank you for taking time to come for your Medicare Wellness Visit. I appreciate your ongoing commitment to your health goals. Please review the following plan we discussed and let me know if I can assist you in the future.   Screening recommendations/referrals: Colonoscopy: no repeat due to age Recommended yearly ophthalmology/optometry visit for glaucoma screening and checkup Recommended yearly dental visit for hygiene and checkup  Vaccinations: Influenza vaccine: 02/23/2020 Pneumococcal vaccine: 08/15/2015, 02/23/2020 Tdap vaccine: 05/06/2012; due every 10 years Shingles vaccine: 01/23/2019, 04/14/2019   Covid-19: 06/07/2019, 06/28/2019, 03/03/2020, 09/13/2020  Advanced directives: Please bring a copy of your health care power of attorney and living will to the office at your convenience.  Conditions/risks identified: Yes; Reviewed health maintenance screenings with patient today and relevant education, vaccines, and/or referrals were provided. Please continue to do your personal lifestyle choices by: daily care of teeth and gums, regular physical activity (goal should be 5 days a week for 30 minutes), eat a healthy diet, avoid tobacco and drug use, limiting any alcohol intake, taking a low-dose aspirin (if not allergic or have been advised by your provider otherwise) and taking vitamins and minerals as recommended by your provider. Continue doing brain stimulating activities (puzzles, reading, adult coloring books, staying active) to keep memory sharp. Continue to eat heart healthy diet (full of fruits, vegetables, whole grains, lean protein, water--limit salt, fat, and sugar intake) and increase physical activity as tolerated.  Next appointment: Please schedule your next Medicare Wellness Visit with your Nurse Health Advisor in 1 year by calling [phone].  Preventive Care 63 Years and Older, Male Preventive care refers to lifestyle choices and visits with your health care provider  that can promote health and wellness. What does preventive care include?  A yearly physical exam. This is also called an annual well check.  Dental exams once or twice a year.  Routine eye exams. Ask your health care provider how often you should have your eyes checked.  Personal lifestyle choices, including:  Daily care of your teeth and gums.  Regular physical activity.  Eating a healthy diet.  Avoiding tobacco and drug use.  Limiting alcohol use.  Practicing safe sex.  Taking low doses of aspirin every day.  Taking vitamin and mineral supplements as recommended by your health care provider. What happens during an annual well check? The services and screenings done by your health care provider during your annual well check will depend on your age, overall health, lifestyle risk factors, and family history of disease. Counseling  Your health care provider may ask you questions about your:  Alcohol use.  Tobacco use.  Drug use.  Emotional well-being.  Home and relationship well-being.  Sexual activity.  Eating habits.  History of falls.  Memory and ability to understand (cognition).  Work and work Statistician. Screening  You may have the following tests or measurements:  Height, weight, and BMI.  Blood pressure.  Lipid and cholesterol levels. These may be checked every 5 years, or more frequently if you are over 51 years old.  Skin check.  Lung cancer screening. You may have this screening every year starting at age 17 if you have a 30-pack-year history of smoking and currently smoke or have quit within the past 15 years.  Fecal occult blood test (FOBT) of the stool. You may have this test every year starting at age 45.  Flexible sigmoidoscopy or colonoscopy. You may have a sigmoidoscopy every 5 years or a colonoscopy every 10 years  starting at age 86.  Prostate cancer screening. Recommendations will vary depending on your family history and other  risks.  Hepatitis C blood test.  Hepatitis B blood test.  Sexually transmitted disease (STD) testing.  Diabetes screening. This is done by checking your blood sugar (glucose) after you have not eaten for a while (fasting). You may have this done every 1-3 years.  Abdominal aortic aneurysm (AAA) screening. You may need this if you are a current or former smoker.  Osteoporosis. You may be screened starting at age 51 if you are at high risk. Talk with your health care provider about your test results, treatment options, and if necessary, the need for more tests. Vaccines  Your health care provider may recommend certain vaccines, such as:  Influenza vaccine. This is recommended every year.  Tetanus, diphtheria, and acellular pertussis (Tdap, Td) vaccine. You may need a Td booster every 10 years.  Zoster vaccine. You may need this after age 95.  Pneumococcal 13-valent conjugate (PCV13) vaccine. One dose is recommended after age 55.  Pneumococcal polysaccharide (PPSV23) vaccine. One dose is recommended after age 64. Talk to your health care provider about which screenings and vaccines you need and how often you need them. This information is not intended to replace advice given to you by your health care provider. Make sure you discuss any questions you have with your health care provider. Document Released: 06/10/2015 Document Revised: 02/01/2016 Document Reviewed: 03/15/2015 Elsevier Interactive Patient Education  2017 [place] Prevention in the Home Falls can cause injuries. They can happen to people of all ages. There are many things you can do to make your home safe and to help prevent falls. What can I do on the outside of my home?  Regularly fix the edges of walkways and driveways and fix any cracks.  Remove anything that might make you trip as you walk through a door, such as a raised step or threshold.  Trim any bushes or trees on the path to your home.  Use  bright outdoor lighting.  Clear any walking paths of anything that might make someone trip, such as rocks or tools.  Regularly check to see if handrails are loose or broken. Make sure that both sides of any steps have handrails.  Any raised decks and porches should have guardrails on the edges.  Have any leaves, snow, or ice cleared regularly.  Use sand or salt on walking paths during winter.  Clean up any spills in your garage right away. This includes oil or grease spills. What can I do in the bathroom?  Use night lights.  Install grab bars by the toilet and in the tub and shower. Do not use towel bars as grab bars.  Use non-skid mats or decals in the tub or shower.  If you need to sit down in the shower, use a plastic, non-slip stool.  Keep the floor dry. Clean up any water that spills on the floor as soon as it happens.  Remove soap buildup in the tub or shower regularly.  Attach bath mats securely with double-sided non-slip rug tape.  Do not have throw rugs and other things on the floor that can make you trip. What can I do in the bedroom?  Use night lights.  Make sure that you have a light by your bed that is easy to reach.  Do not use any sheets or blankets that are too big for your bed. They should not hang down  onto the floor.  Have a firm chair that has side arms. You can use this for support while you get dressed.  Do not have throw rugs and other things on the floor that can make you trip. What can I do in the kitchen?  Clean up any spills right away.  Avoid walking on wet floors.  Keep items that you use a lot in easy-to-reach places.  If you need to reach something above you, use a strong step stool that has a grab bar.  Keep electrical cords out of the way.  Do not use floor polish or wax that makes floors slippery. If you must use wax, use non-skid floor wax.  Do not have throw rugs and other things on the floor that can make you trip. What can  I do with my stairs?  Do not leave any items on the stairs.  Make sure that there are handrails on both sides of the stairs and use them. Fix handrails that are broken or loose. Make sure that handrails are as long as the stairways.  Check any carpeting to make sure that it is firmly attached to the stairs. Fix any carpet that is loose or worn.  Avoid having throw rugs at the top or bottom of the stairs. If you do have throw rugs, attach them to the floor with carpet tape.  Make sure that you have a light switch at the top of the stairs and the bottom of the stairs. If you do not have them, ask someone to add them for you. What else can I do to help prevent falls?  Wear shoes that:  Do not have high heels.  Have rubber bottoms.  Are comfortable and fit you well.  Are closed at the toe. Do not wear sandals.  If you use a stepladder:  Make sure that it is fully opened. Do not climb a closed stepladder.  Make sure that both sides of the stepladder are locked into place.  Ask someone to hold it for you, if possible.  Clearly mark and make sure that you can see:  Any grab bars or handrails.  First and last steps.  Where the edge of each step is.  Use tools that help you move around (mobility aids) if they are needed. These include:  Canes.  Walkers.  Scooters.  Crutches.  Turn on the lights when you go into a dark area. Replace any light bulbs as soon as they burn out.  Set up your furniture so you have a clear path. Avoid moving your furniture around.  If any of your floors are uneven, fix them.  If there are any pets around you, be aware of where they are.  Review your medicines with your doctor. Some medicines can make you feel dizzy. This can increase your chance of falling. Ask your doctor what other things that you can do to help prevent falls. This information is not intended to replace advice given to you by your health care provider. Make sure you  discuss any questions you have with your health care provider. Document Released: 03/10/2009 Document Revised: 10/20/2015 Document Reviewed: 06/18/2014 Elsevier Interactive Patient Education  2017 [employer].

## 2020-09-16 ENCOUNTER — Encounter: Payer: Self-pay | Admitting: Internal Medicine

## 2020-09-16 ENCOUNTER — Encounter (HOSPITAL_COMMUNITY): Payer: Self-pay | Admitting: Cardiology

## 2020-09-16 ENCOUNTER — Ambulatory Visit (HOSPITAL_COMMUNITY)
Admission: RE | Admit: 2020-09-16 | Discharge: 2020-09-16 | Disposition: A | Discharge status: Home / Self Care | Payer: HMO | Source: Ambulatory Visit | Attending: Cardiology | Admitting: Cardiology

## 2020-09-16 VITALS — BP 140/60 | HR 52 | Wt 179.8 lb

## 2020-09-16 DIAGNOSIS — I519 Heart disease, unspecified: Secondary | ICD-10-CM | POA: Diagnosis not present

## 2020-09-16 DIAGNOSIS — I493 Ventricular premature depolarization: Secondary | ICD-10-CM | POA: Diagnosis not present

## 2020-09-16 DIAGNOSIS — R42 Dizziness and giddiness: Secondary | ICD-10-CM | POA: Insufficient documentation

## 2020-09-16 DIAGNOSIS — Z87891 Personal history of nicotine dependence: Secondary | ICD-10-CM | POA: Diagnosis not present

## 2020-09-16 DIAGNOSIS — I25119 Atherosclerotic heart disease of native coronary artery with unspecified angina pectoris: Secondary | ICD-10-CM | POA: Diagnosis not present

## 2020-09-16 DIAGNOSIS — I5022 Chronic systolic (congestive) heart failure: Secondary | ICD-10-CM | POA: Insufficient documentation

## 2020-09-16 DIAGNOSIS — Z7901 Long term (current) use of anticoagulants: Secondary | ICD-10-CM | POA: Diagnosis not present

## 2020-09-16 DIAGNOSIS — Z8249 Family history of ischemic heart disease and other diseases of the circulatory system: Secondary | ICD-10-CM | POA: Diagnosis not present

## 2020-09-16 DIAGNOSIS — I255 Ischemic cardiomyopathy: Secondary | ICD-10-CM | POA: Insufficient documentation

## 2020-09-16 DIAGNOSIS — Z79899 Other long term (current) drug therapy: Secondary | ICD-10-CM | POA: Diagnosis not present

## 2020-09-16 DIAGNOSIS — I480 Paroxysmal atrial fibrillation: Secondary | ICD-10-CM | POA: Insufficient documentation

## 2020-09-16 DIAGNOSIS — I110 Hypertensive heart disease with heart failure: Secondary | ICD-10-CM | POA: Diagnosis not present

## 2020-09-16 DIAGNOSIS — R400 Somnolence: Secondary | ICD-10-CM | POA: Insufficient documentation

## 2020-09-16 DIAGNOSIS — Z8551 Personal history of malignant neoplasm of bladder: Secondary | ICD-10-CM | POA: Insufficient documentation

## 2020-09-16 DIAGNOSIS — Z955 Presence of coronary angioplasty implant and graft: Secondary | ICD-10-CM | POA: Insufficient documentation

## 2020-09-16 LAB — BASIC METABOLIC PANEL
Anion gap: 5 (ref 5–15)
BUN: 23 mg/dL (ref 8–23)
CO2: 29 mmol/L (ref 22–32)
Calcium: 9.7 mg/dL (ref 8.9–10.3)
Chloride: 107 mmol/L (ref 98–111)
Creatinine, Ser: 1.2 mg/dL (ref 0.61–1.24)
GFR, Estimated: 59 mL/min — ABNORMAL LOW (ref >60)
Glucose, Bld: 84 mg/dL (ref 70–99)
Potassium: 4.6 mmol/L (ref 3.5–5.1)
Sodium: 141 mmol/L (ref 135–145)

## 2020-09-16 LAB — CBC
HCT: 42 % (ref 39.0–52.0)
Hemoglobin: 13.2 g/dL (ref 13.0–17.0)
MCH: 26.9 pg (ref 26.0–34.0)
MCHC: 31.4 g/dL (ref 30.0–36.0)
MCV: 85.5 fL (ref 80.0–100.0)
Platelets: 174 10*3/uL (ref 150–400)
RBC: 4.91 MIL/uL (ref 4.22–5.81)
RDW: 13.5 % (ref 11.5–15.5)
WBC: 4.9 10*3/uL (ref 4.0–10.5)
nRBC: 0 % (ref 0.0–0.2)

## 2020-09-16 LAB — MAGNESIUM: Magnesium: 2.4 mg/dL (ref 1.7–2.4)

## 2020-09-16 MED ORDER — SPIRONOLACTONE 25 MG PO TABS: 25.0000 mg | ORAL_TABLET | Freq: Every day | ORAL | 3 refills | Status: DC

## 2020-09-16 MED ORDER — SPIRONOLACTONE 25 MG PO TABS: 50.0000 mg | ORAL_TABLET | Freq: Every day | ORAL | 3 refills | Status: DC

## 2020-09-16 NOTE — Patient Instructions (Addendum)
INCREASE Spironolactone to 50 mg, two tabs daily STOP Hydralazine   Labs today We will only contact you if something comes back abnormal or we need to make some changes. Otherwise no news is good news!  Labs needed in 2 weeks  Your physician recommends that you schedule a follow-up appointment in: 6 months with Dr [name]  At the [facility], you and your health needs are our priority. As part of our continuing mission to provide you with exceptional heart care, we have created designated Provider Care Teams. These Care Teams include your primary Cardiologist (physician) and Advanced Practice Providers (APPs- Physician Assistants and Nurse Practitioners) who all work together to provide you with the care you need, when you need it.   You may see any of the following providers on your designated Care Team at your next follow up: Marland Kitchen Dr Glori Bickers . Dr [name] . Dr Vickki Muff . Darrick Grinder, NP . Lyda Jester, [place] . Audry Riles, PharmD   Please be sure to bring in all your medications bottles to every appointment.   If you have any questions or concerns before your next appointment please send [place] a message through [place] or call our office at [phone].    TO LEAVE A MESSAGE FOR THE NURSE SELECT OPTION 2, PLEASE LEAVE A MESSAGE INCLUDING: . YOUR NAME . DATE OF BIRTH . CALL BACK NUMBER . REASON FOR CALL**this is important as we prioritize the call backs  YOU WILL RECEIVE A CALL BACK THE SAME DAY AS LONG AS YOU CALL BEFORE 4:00 PM

## 2020-09-18 NOTE — Progress Notes (Signed)
Date:  09/18/2020   ID:  Dillon Young, DOB 12/10/1932, MRN [id number]   Provider location: [place] [facility] Type of Visit: Established patient   PCP:  Dillon Lima, MD  Cardiologist:  Dillon Burow, MD HF Cardiology: Dr. Aundra Young   History of Present Illness: Dillon Young is a 87 y.o. male who has a h/o coronary artery disease, chronic systolic CHF, and persistent atrial fibrillation, he was initially referred by Dr. Gwenlyn Young for evaluation of CHF.  He is status post angioplasty in 1994 and 1995of his circumflex coronary artery. He had right carotid endarterectomy performed by Dr. Curt Young June 2013.  Given angina, he had cath in 8/17.  This showed severe RCA stenosis treated with DES. He also had a very small ramus branch that was 90% stenosed and not suitable for intervention as well as a 70% mid LAD lesion although he had no evidence of anterior ischemia on his stress test.  He was diagnosed in 5/19 with new onset atrial fibrillation. Echo was done in 5/19 and showed EF decreased to 30-35%, normal EF by echo in August 2017. He had a DCCV in 5/19, unfortunately, he only held NSR for a few days and was been back in atrial fibrillation soon after that.  He had recurrent chest pain and was again cathed in 01/04/18, this time showing 80% proximal/mid LAD that was treated with DES.  In 10/19, he was admitted for Tikosyn load and was cardioverted back to NSR.   Echo in 1/20 showed  EF up to 55-60% with mild LVH.    11/21 Zio patch showed 22% PVCs with short NSVT runs.  Echo was done in 12/21, showing EF 50-55%, mild LVH, mild MR, normal RV.  Given frequent PVCs, patient was sent to see EP (Dr Dillon Young).  He did not think that the patient was an ablation candidate.   He has been diagnosed with bladder cancer and has had occasional hematuria.   He presents for followup of CHF, PVCs, and atrial fibrillation.  He is doing well symptomatically.  Still with frequent PVCs on  ECG today.  Occasional lightheadedness if he stands too fast, usually notes after he takes hydralazine (taking 25 mg bid).  SBP running 120s-130s at home.  Weight is stable.  No significant exertional dyspnea or chest pain.  No BRBPR/melena. He walks for 4 days/week for about 30 minutes at a time.   ECG (personally reviewed): NSR with PVCs, 1st degree AVB 230 msec, QTc 441 msec  Labs (5/19): LDL 50 Labs (8/19): K 4.1, creatinine 1.14 Labs (10/19): K 4.6, creatinine 1.17 Labs (11/19): K 4.5, creatinine 1.08 Labs (1/20): LDL 48 Labs (2/20): K 4.4, creatinine 1.18 Labs (2/21): K 4.2, creatinine 1.14, LDL 35 Labs (1/22): K 4, creatinine 1.09, hgb 13.5, LDL 53 Labs (2/22): K 4.5, creatinine 1.17  PMH: 1. HTN 2. Hyperlipidemia 3. Carotid artery disease: Right CEA in 6/13.  - Carotid dopplers (5/20): 1-39% BICA stenosis, left subclavian stenosis.  - Carotid dopplers (5/21): s/p right CEA, 14-97% LICA stenosis.  4. CAD: - PTCA to LCx in 1994, 1995.  - Angina 8/17 => DES to RCA, also noted to have 70% mLAD, 90% stenosis small ramus.  - LHC 12/26/17 with 80% ostial-proximal LAD, EF < 25% on LV-gram.  DES to LAD.  5. Chronic systolic CHF: Ischemic cardiomyopathy.   - Echo (8/17) with normal EF.  - Echo (5/19) with EF 30-35%, severe LV dilation, regional wall  motion abnormalities.  - Echo (1/20): EF 55-60%, mild LVH, normal RV size and systolic function.  - Echo (12/21): EF 50-55%, mild LVH, normal RV size and systolic function, moderate LAE, mild MR, IVC normal.  6. Atrial fibrillation: Diagnosed 5/19.   - DCCV 5/19 only held for about 2 days.  - Tikosyn loaded in 10/19 and DCCV to NSR.  7. Bladder cancer 8. PVCs: Zio patch (11/21) with 22% PVCs, short NSVT runs.   Current Outpatient Medications  Medication Sig Dispense Refill  . acetaminophen (TYLENOL) 500 MG tablet Take 500-1,000 mg by mouth every 6 (six) hours as needed (for headaches).    Marland Kitchen apixaban (ELIQUIS) 5 MG TABS tablet Take 5 mg  by mouth 2 (two) times daily.    . carvedilol (COREG) 6.25 MG tablet Take 1.5 tablets (9.375 mg total) by mouth 2 (two) times daily. 270 tablet 3  . Cholecalciferol (VITAMIN D-3) 25 MCG (1000 UT) CAPS Take 1,000 Units by mouth daily.    . clobetasol (TEMOVATE) 0.05 % external solution Apply 1 application topically See admin instructions. Apply to affected areas of the back twice weekly    . dofetilide (TIKOSYN) 125 MCG capsule TAKE 1 CAPSULE BY MOUTH TWICE DAILY 180 capsule 3  . fexofenadine (ALLEGRA) 180 MG tablet Take 180 mg by mouth daily as needed for allergies or rhinitis.     . fluticasone (FLONASE) 50 MCG/ACT nasal spray Place 2 sprays into both nostrils daily as needed for allergies or rhinitis. 48 g 1  . hydrocortisone (ANUSOL-HC) 2.5 % rectal cream Place 1 application rectally 3 (three) times daily. 30 g 2  . Hypromellose (ARTIFICIAL TEARS OP) Place 1 drop into both eyes 4 (four) times daily as needed (dry eyes).    Marland Kitchen ipratropium (ATROVENT) 0.06 % nasal spray Place 2 sprays into both nostrils 4 (four) times daily as needed for rhinitis. 45 mL 1  . levocetirizine (XYZAL) 5 MG tablet Take 5 mg by mouth daily as needed for allergies.    . nitroGLYCERIN (NITROSTAT) 0.4 MG SL tablet Place 1 tablet (0.4 mg total) under the tongue every 5 (five) minutes as needed for chest pain. 25 tablet 3  . rosuvastatin (CRESTOR) 10 MG tablet TAKE 1 TABLET (10 MG TOTAL) BY MOUTH DAILY. NEEDS APPT FOR FUTURE REFILLS 90 tablet 0  . sacubitril-valsartan (ENTRESTO) 97-103 MG Take 1 tablet by mouth 2 (two) times daily. 180 tablet 3  . sodium chloride (OCEAN) 0.65 % SOLN nasal spray Place 1-2 sprays into both nostrils as needed for congestion.    . triamcinolone cream (KENALOG) 0.1 % Apply 1 application topically daily as needed (to affected areas, for itching).     . Wheat Dextrin (BENEFIBER DRINK MIX PO) Take by mouth. Take 2 tablespoons daily    . spironolactone (ALDACTONE) 25 MG tablet Take 2 tablets (50 mg  total) by mouth daily. 180 tablet 3   No current facility-administered medications for this encounter.    Allergies:   Lipitor [atorvastatin] and Other   Social History:  The patient  reports that he quit smoking about 12 years ago. His smoking use included cigarettes. He has a 90.00 pack-year smoking history. He has never used smokeless tobacco. He reports previous alcohol use. He reports that he does not use drugs.   Family History:  The patient's family history includes Asthma in his paternal grandmother; Heart disease in his father; Hypertension in his mother.   ROS:  Please see the history of present illness.  All other systems are personally reviewed and negative.   Exam:   BP 140/60   Pulse (!) 52   Wt 81.6 kg (179 lb 12.8 oz)   SpO2 96%   BMI 26.55 kg/m  General: NAD Neck: No JVD, no thyromegaly or thyroid nodule.  Lungs: Clear to auscultation bilaterally with normal respiratory effort. CV: Nondisplaced PMI.  Heart regular S1/S2, no S3/S4, no murmur.  No peripheral edema.  No carotid bruit.  Normal pedal pulses.  Abdomen: Soft, nontender, no hepatosplenomegaly, no distention.  Skin: Intact without lesions or rashes.  Neurologic: Alert and oriented x 3.  Psych: Normal affect. Extremities: No clubbing or cyanosis.  HEENT: Normal.   Recent Labs: 06/30/2020: ALT 9 09/16/2020: BUN 23; Creatinine, Ser 1.20; Hemoglobin 13.2; Magnesium 2.4; Platelets 174; Potassium 4.6; Sodium 141  Personally reviewed   Wt Readings from Last 3 Encounters:  09/16/20 81.6 kg (179 lb 12.8 oz)  09/15/20 81 kg (178 lb 9.6 oz)  08/25/20 81.6 kg (180 lb)      ASSESSMENT AND PLAN:  1. Chronic systolic CHF: Suspect mixed ischemic/nonischemic cardiomyopathy.  Echo in 5/19 with EF 30-35% and severe LV dilation.  CAD likely plays role, but fall in EF was noted around the time he went into atrial fibrillation.  No RVR, but can see fall in EF with afib even in the absence of RVR. He has been in NSR on  Tikosyn, and echo from 1/20 showed EF up to 55-60%.  I was concerned that there would be a fall in his EF due to frequent PVCs, but echo in 12/21 showed EF 50-55%. On exam, he is euvolemic.  NYHA class II symptoms.  Still  Has occasional orthostatic symptoms, these episodes seem to be associated with hydralazine.  - Continue Coreg 9.375 mg bid with frequent PVCs, HR in 50s so will not increase.  - Continue Entresto 97/103 bid.    - Increase spironolactone to 50 mg daily and stop hydralazine.  BMET today and in 10 days.   2. CAD: Most recently, DES to proximal/mid LAD on 12/26/17.  He was on Plavix for > 6 months then stopped it with gross hematuria.  He has had frequent PVCs, but LV function remains preserved and he has had no chest pain.  - Continue apixaban 5 mg bid.  CBC today.  - No ASA with apixaban use.  - Continue Crestor, good lipids recently.  3. Atrial fibrillation: Paroxysmal.  DCCV in 5/19 only held a few days.  Tikosyn loaded in 10/19 with DCCV, now in NSR.   QTc ok on ECG today.  - Continue Eliquis 5 mg bid.  - Continue Tikosyn, BMET and Mg today.  4. HTN: BP mildly high in the office today but controlled at home.    - No amlodipine with ankle edema.  - Continue Coreg and Entresto as above.  - Increase spironolactone to 50 mg daily to hopefully allow [place] to stop hydralazine.   - Continue to follow BP, call in if it runs high off hydralazine.  5. Carotid stenosis: Repeat carotid dopplers 5/22.    6. PVCs: 22% PVCs with NSVT runs on Zio patch in 11/21.  This is despite being on Tikosyn and Coreg.  He has not had chest pain and EF remains preserved.  He saw Dr. Rayann Young who recommended medical management rather than attempting ablation.  - Continue Coreg 9.375 bid, will not increase with bradycardia.  - Continue Tikosyn.  7. OSA: Significant daytime sleepiness, suspect OSA.   -  Needs home sleep study.    Followup in 6 months.   Signed, [name], MD  09/18/2020  [facility] [address] [facility] [place] [address] [phone] (office) [phone] (fax)

## 2020-09-19 ENCOUNTER — Telehealth: Payer: Self-pay | Admitting: Internal Medicine

## 2020-09-19 NOTE — Progress Notes (Signed)
  Chronic Care Management   Outreach Note  09/19/2020 Name: Dillon Young MRN: [id number] DOB: Nov 20, 1932  Referred by: [name], MD Reason for referral : No chief complaint on file.   An unsuccessful telephone outreach was attempted today. The patient was referred to the pharmacist for assistance with care management and care coordination.   Follow Up Plan:   Carley Perdue [employer] Scheduler

## 2020-09-21 DIAGNOSIS — N3946 Mixed incontinence: Secondary | ICD-10-CM | POA: Diagnosis not present

## 2020-09-21 DIAGNOSIS — Z8551 Personal history of malignant neoplasm of bladder: Secondary | ICD-10-CM | POA: Diagnosis not present

## 2020-09-21 DIAGNOSIS — R8271 Bacteriuria: Secondary | ICD-10-CM | POA: Diagnosis not present

## 2020-09-26 ENCOUNTER — Telehealth: Payer: Self-pay | Admitting: Pharmacist

## 2020-09-26 ENCOUNTER — Telehealth: Payer: Self-pay | Admitting: Internal Medicine

## 2020-09-26 DIAGNOSIS — N3945 Continuous leakage: Secondary | ICD-10-CM | POA: Diagnosis not present

## 2020-09-26 DIAGNOSIS — M6289 Other specified disorders of muscle: Secondary | ICD-10-CM | POA: Diagnosis not present

## 2020-09-26 DIAGNOSIS — M6281 Muscle weakness (generalized): Secondary | ICD-10-CM | POA: Diagnosis not present

## 2020-09-26 DIAGNOSIS — N393 Stress incontinence (female) (male): Secondary | ICD-10-CM | POA: Diagnosis not present

## 2020-09-26 NOTE — Progress Notes (Signed)
Chronic Care Management Pharmacy Assistant   Name: Dillon Young  MRN: [id number] DOB: [birth year]  Reason for Encounter: Initial Questions  Appt: Telephone 10/03/20 9:45 am  Recent office visits:  None noted  Recent consult visits:  07/28/20 [place] (Pharmacists) - Hypertension. Increase carvedilol to 1.5 tablets twice daily. Check BP at home. f/u 1 month.  06/30/20 [place] Gertie Fey) - Abdominal Pain. Start Dicyclomine HCl & Hydrocortisone 2.5%. Patient no longer takes gabapentin, hydralazine hcl, melatonin & tramadol hcl.  06/20/20 [place] (Podiatry) - foot swelling. Injection of 0.5cc Celestone Soluspan injected  Start Colchicine 0.6 mg. F/u prn.   06/07/20 [name] (Cardiology) -  Chronic systolic failure. No changes. F/u 6 months. F/u 12 months.  05/16/20 [place] (Cardiology) - Chronic systolic failure. No changes. F/u 6 months.  04/20/20 [facility] (Podiatry) -New nail problem, Diabetic neuropathy. Start Gabapentin 100 mg. F/u prn.    Hospital visits: Procedure Only Medication Reconciliation was completed by comparing discharge summary, patient's EMR and Pharmacy list, and upon discussion with patient.  Admitted to the hospital on 05/31/20 due to [place]. Discharge date was 05/31/20. Discharged from [facility].    Medications that remain the same after Hospital Discharge:??  -All other medications will remain the same.    Medications: Outpatient Encounter Medications as of 09/26/2020  Medication Sig  . acetaminophen (TYLENOL) 500 MG tablet Take 500-1,000 mg by mouth every 6 (six) hours as needed (for headaches).  Marland Kitchen apixaban (ELIQUIS) 5 MG TABS tablet Take 5 mg by mouth 2 (two) times daily.  . carvedilol (COREG) 6.25 MG tablet Take 1.5 tablets (9.375 mg total) by mouth 2 (two) times daily.  . Cholecalciferol (VITAMIN D-3) 25 MCG (1000 UT) CAPS Take 1,000 Units by mouth daily.  . clobetasol (TEMOVATE) 0.05 %  external solution Apply 1 application topically See admin instructions. Apply to affected areas of the back twice weekly  . dofetilide (TIKOSYN) 125 MCG capsule TAKE 1 CAPSULE BY MOUTH TWICE DAILY  . fexofenadine (ALLEGRA) 180 MG tablet Take 180 mg by mouth daily as needed for allergies or rhinitis.   . fluticasone (FLONASE) 50 MCG/ACT nasal spray Place 2 sprays into both nostrils daily as needed for allergies or rhinitis.  . hydrocortisone (ANUSOL-HC) 2.5 % rectal cream Place 1 application rectally 3 (three) times daily.  . Hypromellose (ARTIFICIAL TEARS OP) Place 1 drop into both eyes 4 (four) times daily as needed (dry eyes).  Marland Kitchen ipratropium (ATROVENT) 0.06 % nasal spray Place 2 sprays into both nostrils 4 (four) times daily as needed for rhinitis.  Marland Kitchen levocetirizine (XYZAL) 5 MG tablet Take 5 mg by mouth daily as needed for allergies.  . nitroGLYCERIN (NITROSTAT) 0.4 MG SL tablet Place 1 tablet (0.4 mg total) under the tongue every 5 (five) minutes as needed for chest pain.  . rosuvastatin (CRESTOR) 10 MG tablet TAKE 1 TABLET (10 MG TOTAL) BY MOUTH DAILY. NEEDS APPT FOR FUTURE REFILLS  . sacubitril-valsartan (ENTRESTO) 97-103 MG Take 1 tablet by mouth 2 (two) times daily.  . sodium chloride (OCEAN) 0.65 % SOLN nasal spray Place 1-2 sprays into both nostrils as needed for congestion.  Marland Kitchen spironolactone (ALDACTONE) 25 MG tablet Take 2 tablets (50 mg total) by mouth daily.  Marland Kitchen triamcinolone cream (KENALOG) 0.1 % Apply 1 application topically daily as needed (to affected areas, for itching).   . Wheat Dextrin (BENEFIBER DRINK MIX PO) Take by mouth. Take 2 tablespoons daily   No facility-administered encounter  medications on file as of 09/26/2020.    Have you seen any other providers since your last visit? Patient states he recently saw Dr. [name] and Dr. [name].  Any changes in your medications or health?  Patient states he no longer takes Hydralazine 25 mg.  Any side effects from any medications?   Patient states no side effects at this time.  Do you have any symptoms or problems not managed by your medications?  Patient states not that he can think of.  Any concerns about your health right now? Patient has no concerns at this time.  Has your provider asked that you check blood pressure, blood sugar, or follow special diet at home?  Patient checks his BP 2x daily. He currently does not check his glucose but would love too. He doesn't  follow a special diet either.  5/3 -129/54  am 5/2 - 124/54  am        134/56 pm 5/1 - 123/67  am        144/59 pm  Do you get any type of exercise on a regular basis?  Patient states him and his wife walks at a gym daily.  Can you think of a goal you would like to reach for your health?  Patient states not at this time.  Do you have any problems getting your medications?  Patient states no problems getting medicines.  Is there anything that you would like to discuss during the appointment?  Patient is interested in getting a glucose monitor to check blood sugars at home.  Please bring medications and supplements to appointment   Star Rating Drugs: Rosuvastatin - last fill 08/12/20 90D Sacubitril-valsartan - last fill 08/17/19 [name], RMA Clinical Pharmacists Assistant [phone]  Time Spent: [phone]

## 2020-09-26 NOTE — Progress Notes (Signed)
  Chronic Care Management   Note  09/26/2020 Name: KARMELO BASS MRN: [id number] DOB: Jun 26, 1932  SHELDON SEM is a 87 y.o. year old male who is a primary care patient of [name], MD. I reached out to [name] by phone today in response to a referral sent by Mr. Jostin Rue Hunzeker's PCP, [name], MD.   Mr. Silveria was given information about Chronic Care Management services today including:  1. CCM service includes personalized support from designated clinical staff supervised by his physician, including individualized plan of care and coordination with other care providers 2. 24/7 contact phone numbers for assistance for urgent and routine care needs. 3. Service will only be billed when office clinical staff spend 20 minutes or more in a month to coordinate care. 4. Only one practitioner may furnish and bill the service in a calendar month. 5. The patient may stop CCM services at any time (effective at the end of the month) by phone call to the office staff.   Patient agreed to services and verbal consent obtained.   Follow up plan:   Carley Perdue [employer] Scheduler

## 2020-09-27 ENCOUNTER — Ambulatory Visit (INDEPENDENT_AMBULATORY_CARE_PROVIDER_SITE_OTHER): Payer: HMO | Admitting: Otolaryngology

## 2020-09-27 ENCOUNTER — Encounter (INDEPENDENT_AMBULATORY_CARE_PROVIDER_SITE_OTHER): Payer: Self-pay | Admitting: Otolaryngology

## 2020-09-27 ENCOUNTER — Other Ambulatory Visit: Payer: Self-pay

## 2020-09-27 VITALS — Temp 97.0°F

## 2020-09-27 DIAGNOSIS — H6981 Other specified disorders of Eustachian tube, right ear: Secondary | ICD-10-CM | POA: Diagnosis not present

## 2020-09-27 DIAGNOSIS — J310 Chronic rhinitis: Secondary | ICD-10-CM

## 2020-09-27 DIAGNOSIS — H903 Sensorineural hearing loss, bilateral: Secondary | ICD-10-CM

## 2020-09-27 DIAGNOSIS — H6121 Impacted cerumen, right ear: Secondary | ICD-10-CM | POA: Diagnosis not present

## 2020-09-27 NOTE — Progress Notes (Addendum)
HPI: Dillon Young is a 87 y.o. male who presents for evaluation of "chronic sinus complaints".  He is also has some hearing problems as if there is fluid in his ears.  He wears bilateral hearing aids.  He was curious as to whether he had nasal polyps.  He takes several allergy medications including Xyzal, Atrovent nasal spray which seems to help the most with his nasal drainage.  He occasionally uses Flonase.  He complains about itching in his ears right side worse than left.  However he feels like he has fluid in both ears. He also complains of chronic drainage from his nose which is generally clear. His nasal congestion is much worse at night when he lies down.  Initially he is able to sleep for couple of hours but then when he wakes up in the middle of the night with a lot of nasal congestion he sleeps better in a recliner.  Past Medical History:  Diagnosis Date  . Adenomatous colon polyp   . Allergic rhinitis   . Anemia   . Anxiety   . BPH (benign prostatic hyperplasia)   . CAD (coronary artery disease)    sees Dr. [name]   . Carotid artery disease ([place])   . CHF (congestive heart failure) ([place])   . Chronic kidney disease    [place]. Prostate cancer  . Clotting disorder ([place])   . CVA (cerebral infarction)   . Diabetes mellitus    "Borderline"  . Diverticulitis   . Dysrhythmia    atrial fib - hx of   . Edema    recent swelling in feet and ankles   . GERD (gastroesophageal reflux disease)    history  . Headache   . Hemorrhoids   . History of kidney stones   . HTN (hypertension)   . Hx of radiation therapy    for prostate - 2013   . Hyperlipidemia LDL goal <70   . IBS (irritable bowel syndrome)   . Low back pain   . Pre-diabetes   . Prostate cancer ([place]) 2008   seed implantation  . Sleep apnea    sitting in recliner helps him to breathe per pt , never used cpap sleep study - pt reports could not go to sleep- 6 years ago   . Smoker   . Stroke [facility])    hx of mini  strokes years ago    Past Surgical History:  Procedure Laterality Date  . APPENDECTOMY    . CARDIAC CATHETERIZATION N/A 01/16/2016   Procedure: Left Heart Cath and Coronary Angiography;  Surgeon: Jettie Booze, MD;  Location: [place] CV LAB;  Service: Cardiovascular;  Laterality: N/A;  . CARDIAC CATHETERIZATION N/A 01/16/2016   Procedure: Coronary Stent Intervention;  Surgeon: Jettie Booze, MD;  Location: [place] CV LAB;  Service: Cardiovascular;  Laterality: N/A;  . CARDIOVERSION N/A 11/26/2017   Procedure: CARDIOVERSION;  Surgeon: [name], MD;  Location: [facility] ENDOSCOPY;  Service: Cardiovascular;  Laterality: N/A;  . CARDIOVERSION N/A 03/13/2018   Procedure: CARDIOVERSION;  Surgeon: [name], MD;  Location: [facility] ENDOSCOPY;  Service: Cardiovascular;  Laterality: N/A;  . CATARACT EXTRACTION W/ INTRAOCULAR LENS  IMPLANT, BILATERAL Bilateral   . COLONOSCOPY  10/21/2008   diverticulosis, radiation proctitis, internal hemorrhoids  . CORONARY ANGIOPLASTY    . CORONARY STENT INTERVENTION N/A 12/26/2017   Procedure: CORONARY STENT INTERVENTION;  Surgeon: [name], MD;  Location: [place] CV LAB;  Service: Cardiovascular;  Laterality:  N/A;  . ENDARTERECTOMY  11/12/2011   Procedure: ENDARTERECTOMY CAROTID;  Surgeon: Rosetta Posner, MD;  Location: [facility] OR;  Service: Vascular;  Laterality: Right;  . ESOPHAGOGASTRODUODENOSCOPY (EGD) WITH ESOPHAGEAL DILATION    . HEMORRHOID BANDING    . LEFT HEART CATH AND CORONARY ANGIOGRAPHY N/A 12/26/2017   Procedure: LEFT HEART CATH AND CORONARY ANGIOGRAPHY;  Surgeon: [name], MD;  Location: [place] CV LAB;  Service: Cardiovascular;  Laterality: N/A;  . LITHOTRIPSY  X 3  . LUMBAR LAMINECTOMY    . TRANSURETHRAL RESECTION OF PROSTATE  10/2003  . TRANSURETHRAL RESECTION OF PROSTATE N/A 05/31/2020   Procedure: CYSTOSCOPY TRANSURETHRAL RESECTION BIOPSY AND FULGURATION OF PROSTATE LESION  (TURP);  Surgeon: Irine Seal, MD;   Location: [facility] ORS;  Service: Urology;  Laterality: N/A;   Social History   Socioeconomic History  . Marital status: Married    Spouse name: Not on file  . Number of children: 2  . Years of education: Not on file  . Highest education level: Not on file  Occupational History  . 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 Smoking status: Former Smoker    Packs/day: 1.50    Years: 60.00    Pack years: 90.00    Types: Cigarettes    Quit date: 07/25/2008    Years since quitting: 12.1  . Smokeless tobacco: Never Used  Vaping Use  . Vaping Use: Never used  Substance and Sexual Activity  . Alcohol use: Not Currently  . Drug use: No  . Sexual activity: Not Currently  Other Topics Concern  . Not on file  Social History Narrative  . Not on file   Social Determinants of Health   Financial Resource Strain: Low Risk   . Difficulty of Paying Living Expenses: Not hard at all  Food Insecurity: No Food Insecurity  . Worried About Charity fundraiser in the Last Year: Never true  . Ran Out of Food in the Last Year: Never true  Transportation Needs: No Transportation Needs  . Lack of Transportation (Medical): No  . Lack of Transportation (Non-Medical): No  Physical Activity: Sufficiently Active  . Days of Exercise per Week: 5 days  . Minutes of Exercise per Session: 30 min  Stress: No Stress Concern Present  . Feeling of Stress : Not at all  Social Connections: Socially Integrated  . Frequency of Communication with Friends and Family: More than three times a week  . Frequency of Social Gatherings with Friends and Family: More than three times a week  . Attends Religious Services: More than 4 times per year  . Active Member of Clubs or Organizations: Yes  . Attends Archivist Meetings: More than 4 times per year  . Marital Status: Married   Family History  Problem Relation Age of Onset  . Heart disease Father   . Hypertension Mother   . Asthma Paternal Grandmother    . Colon cancer Neg Hx   . Esophageal cancer Neg Hx   . Stomach cancer Neg Hx   . Rectal cancer Neg Hx   . Liver cancer Neg Hx    Allergies  Allergen Reactions  . Lipitor [Atorvastatin] Other (See Comments)    Muscle pain  . Other Rash and Other (See Comments)    Detergent- Rashes on the skin   Prior to Admission medications   Medication Sig Start Date End Date Taking? Authorizing Provider  acetaminophen (TYLENOL) 500 MG tablet Take 500-1,000 mg by  mouth every 6 (six) hours as needed (for headaches).    [provider]  apixaban (ELIQUIS) 5 MG TABS tablet Take 5 mg by mouth 2 (two) times daily.    [provider]  carvedilol (COREG) 6.25 MG tablet Take 1.5 tablets (9.375 mg total) by mouth 2 (two) times daily. 08/04/20   [name], MD  Cholecalciferol (VITAMIN D-3) 25 MCG (1000 UT) CAPS Take 1,000 Units by mouth daily.    [provider]  clobetasol (TEMOVATE) 0.05 % external solution Apply 1 application topically See admin instructions. Apply to affected areas of the back twice weekly 10/08/19   [provider]  dofetilide (TIKOSYN) 125 MCG capsule TAKE 1 CAPSULE BY MOUTH TWICE DAILY 12/08/19   Larey Dresser, MD  fexofenadine (ALLEGRA) 180 MG tablet Take 180 mg by mouth daily as needed for allergies or rhinitis.     [provider]  fluticasone (FLONASE) 50 MCG/ACT nasal spray Place 2 sprays into both nostrils daily as needed for allergies or rhinitis. 01/25/20   [name], MD  hydrocortisone (ANUSOL-HC) 2.5 % rectal cream Place 1 application rectally 3 (three) times daily. 06/30/20   [place], Amy S, PA-C  Hypromellose (ARTIFICIAL TEARS OP) Place 1 drop into both eyes 4 (four) times daily as needed (dry eyes).    [provider]  ipratropium (ATROVENT) 0.06 % nasal spray Place 2 sprays into both nostrils 4 (four) times daily as needed for rhinitis. 01/25/20   [name], MD  levocetirizine (XYZAL) 5 MG tablet Take 5  mg by mouth daily as needed for allergies.    [provider]  nitroGLYCERIN (NITROSTAT) 0.4 MG SL tablet Place 1 tablet (0.4 mg total) under the tongue every 5 (five) minutes as needed for chest pain. 04/11/20   [name], MD  rosuvastatin (CRESTOR) 10 MG tablet TAKE 1 TABLET (10 MG TOTAL) BY MOUTH DAILY. NEEDS APPT FOR FUTURE REFILLS 08/12/20   [name], MD  sacubitril-valsartan (ENTRESTO) 97-103 MG Take 1 tablet by mouth 2 (two) times daily. 08/05/20   [name], MD  sodium chloride (OCEAN) 0.65 % SOLN nasal spray Place 1-2 sprays into both nostrils as needed for congestion.    [provider]  spironolactone (ALDACTONE) 25 MG tablet Take 2 tablets (50 mg total) by mouth daily. 09/16/20   Larey Dresser, MD  triamcinolone cream (KENALOG) 0.1 % Apply 1 application topically daily as needed (to affected areas, for itching).  01/06/19   [provider]  Wheat Dextrin (BENEFIBER DRINK MIX PO) Take by mouth. Take 2 tablespoons daily    [provider]     Positive ROS: Otherwise negative  All other systems have been reviewed and were otherwise negative with the exception of those mentioned in the HPI and as above.  Physical Exam: Constitutional: Alert, well-appearing, no acute distress Ears: External ears without lesions or tenderness.  He had a large amount of wax in the right ear canal that was cleaned with suctioning forceps.  Left ear canal and left TM are clear.  After cleaning the wax from the right ear canal he has a retraction pocket posteriorly on the right TM but I was able to insufflate air in resolve the retraction pocket without difficulty.  He had no middle ear effusion noted on either side.  On tuning fork testing AC was greater than BC bilaterally.  Weber was midline. Nasal: External nose without lesions. Septum deviated to the right high  posteriorly.  Nasal endoscopy was performed nasal endoscopy there are no intranasal  polyps or middle meatal polyps.  He had moderate rhinitis.  The nasopharynx was clear.  Both eustachian tube areas were unobstructed.. Oral: Lips and gums without lesions. Tongue and palate mucosa without lesions. Posterior oropharynx clear. Neck: No palpable adenopathy or masses Respiratory: Breathing comfortably  Skin: No facial/neck lesions or rash noted.  Cerumen impaction removal  Date/Time: 09/27/2020 4:07 PM Performed by: Rozetta Nunnery, MD Authorized by: Rozetta Nunnery, MD   Consent:    Consent obtained:  Verbal   Consent given by:  Patient   Risks discussed:  Pain and bleeding Procedure details:    Location:  R ear   Procedure type: curette, suction and forceps   Post-procedure details:    Inspection:  TM intact and canal normal   Hearing quality:  Improved   Patient tolerance of procedure:  Tolerated well, no immediate complications Comments:     Right TM was retracted posteriorly but was able to insufflate air behind the right TM without difficulty.  Left ear canal and TM are clear.    Assessment: Patient with chronic rhinitis as well as allergies.  Secondary nasal congestion. Eustachian tube dysfunction on the right side along with wax buildup on the right side that was cleaned in the office.  Plan: I prescribed Nasacort 2 sprays each nostril at night in addition to his other medications. He will follow-up in 2 months for recheck and audiologic testing with his audiologist who does his hearing aids.  Suggested that he ask them for what they would recommend for itching in his ears with his hearing aids. Discussed with him that he has no polyps in the nose.  Radene Journey, MD

## 2020-09-29 ENCOUNTER — Ambulatory Visit: Payer: HMO

## 2020-09-30 ENCOUNTER — Other Ambulatory Visit (HOSPITAL_COMMUNITY): Payer: HMO

## 2020-09-30 ENCOUNTER — Ambulatory Visit (HOSPITAL_COMMUNITY)
Admission: RE | Admit: 2020-09-30 | Discharge: 2020-09-30 | Disposition: A | Discharge status: Home / Self Care | Payer: HMO | Source: Ambulatory Visit | Attending: Cardiology | Admitting: Cardiology

## 2020-09-30 ENCOUNTER — Other Ambulatory Visit: Payer: Self-pay

## 2020-09-30 DIAGNOSIS — I519 Heart disease, unspecified: Secondary | ICD-10-CM | POA: Insufficient documentation

## 2020-09-30 LAB — BASIC METABOLIC PANEL
Anion gap: 7 (ref 5–15)
BUN: 20 mg/dL (ref 8–23)
CO2: 27 mmol/L (ref 22–32)
Calcium: 9 mg/dL (ref 8.9–10.3)
Chloride: 106 mmol/L (ref 98–111)
Creatinine, Ser: 1.16 mg/dL (ref 0.61–1.24)
GFR, Estimated: >60 mL/min (ref >60)
Glucose, Bld: 141 mg/dL — ABNORMAL HIGH (ref 70–99)
Potassium: 4.2 mmol/L (ref 3.5–5.1)
Sodium: 140 mmol/L (ref 135–145)

## 2020-10-03 ENCOUNTER — Ambulatory Visit (INDEPENDENT_AMBULATORY_CARE_PROVIDER_SITE_OTHER): Payer: HMO | Admitting: Pharmacist

## 2020-10-03 ENCOUNTER — Other Ambulatory Visit: Payer: Self-pay

## 2020-10-03 DIAGNOSIS — I519 Heart disease, unspecified: Secondary | ICD-10-CM

## 2020-10-03 DIAGNOSIS — I4819 Other persistent atrial fibrillation: Secondary | ICD-10-CM | POA: Diagnosis not present

## 2020-10-03 DIAGNOSIS — I2510 Atherosclerotic heart disease of native coronary artery without angina pectoris: Secondary | ICD-10-CM | POA: Diagnosis not present

## 2020-10-03 DIAGNOSIS — E119 Type 2 diabetes mellitus without complications: Secondary | ICD-10-CM | POA: Diagnosis not present

## 2020-10-03 DIAGNOSIS — E785 Hyperlipidemia, unspecified: Secondary | ICD-10-CM | POA: Diagnosis not present

## 2020-10-03 DIAGNOSIS — I10 Essential (primary) hypertension: Secondary | ICD-10-CM

## 2020-10-03 NOTE — Progress Notes (Signed)
Chronic Care Management Pharmacy Note  10/09/2020 Name:  DRAYSEN WEYGANDT MRN:  [id number] DOB:  [birth year]  Subjective: Dillon Young is an 87 y.o. year old male who is a primary patient of [name], MD.  The CCM team was consulted for assistance with disease management and care coordination needs.    Engaged with patient by telephone for initial visit in response to provider referral for pharmacy case management and/or care coordination services.   Consent to Services:  The patient was given the following information about Chronic Care Management services today, agreed to services, and gave verbal consent: 1. CCM service includes personalized support from designated clinical staff supervised by the primary care provider, including individualized plan of care and coordination with other care providers 2. 24/7 contact phone numbers for assistance for urgent and routine care needs. 3. Service will only be billed when office clinical staff spend 20 minutes or more in a month to coordinate care. 4. Only one practitioner may furnish and bill the service in a calendar month. 5.The patient may stop CCM services at any time (effective at the end of the month) by phone call to the office staff. 6. The patient will be responsible for cost sharing (co-pay) of up to 20% of the service fee (after annual deductible is met). Patient agreed to services and consent obtained.  Patient Care Team: [name], MD as PCP - General (Internal Medicine) [name], MD as PCP - Cardiology (Cardiology) Charlton Haws, [facility] as Pharmacist (Pharmacist)  Recent office visits: 02/23/20 Dr Ronnald Ramp OV: chronic f/u; c/o hematuria - reduced Eliquis to 2.5 mg BID  Recent consult visits: 09/16/20 Dr [name] (Vascular): f/u CHF. Increase spironolactone to 50 mg and stop hydralazine. No ASA w/ Eliquis.  07/28/20 [place],[facility] (Pharmacists) - Hypertension. Increase carvedilol to 1.5 tablets twice daily. Check BP  at home. f/u 1 month.  06/30/20 [place] Gertie Fey) - Abdominal Pain. Start Dicyclomine HCl & Hydrocortisone 2.5%. Patient no longer takes gabapentin, hydralazine hcl, melatonin & tramadol hcl.  06/20/20 Evans (Podiatry) - foot swelling. Injection of 0.5cc Celestone Soluspan injected  Start Colchicine 0.6 mg. F/u prn.  06/07/20 [name] (Cardiology) -  Chronic systolic failure. No changes. F/u 6 months. F/u 12 months.  05/16/20 Allred (Cardiology) - Chronic systolic failure. No changes. F/u 6 months.  04/20/20 Evans (Podiatry) -New nail problem, Diabetic neuropathy. Start Gabapentin 100 mg. F/u prn.   Hospital visits: None in previous 6 months  Objective:  Lab Results  Component Value Date   CREATININE 1.16 09/30/2020   BUN 20 09/30/2020   GFR 56.68 (L) 06/30/2020   GFRNONAA >60 09/30/2020   GFRAA 68 02/05/2020   NA 140 09/30/2020   K 4.2 09/30/2020   CALCIUM 9.0 09/30/2020   CO2 27 09/30/2020   GLUCOSE 141 (H) 09/30/2020    Lab Results  Component Value Date/Time   HGBA1C 6.2 (H) 05/05/2020 09:03 AM   HGBA1C 6.5 (H) 01/06/2020 11:49 AM   GFR 56.68 (L) 06/30/2020 12:17 PM   GFR 61.06 07/15/2019 09:10 AM   MICROALBUR 5.7 01/06/2020 11:56 AM   MICROALBUR 4.2 (H) 01/12/2019 03:53 PM    Last diabetic Eye exam:  Lab Results  Component Value Date/Time   HMDIABEYEEXA No Retinopathy 08/15/2020 12:00 AM    Last diabetic Foot exam: No results found for: HMDIABFOOTEX   Lab Results  Component Value Date   CHOL 123 06/16/2020   HDL 51 06/16/2020   LDLCALC 53 06/16/2020   TRIG  93 06/16/2020   CHOLHDL 2.4 06/16/2020    Hepatic Function Latest Ref Rng & Units 06/30/2020 02/05/2020 12/31/2019  Total Protein 6.0 - 8.3 g/dL 7.4 6.7 7.7  Albumin 3.5 - 5.2 g/dL 4.4 - 4.3  AST 0 - 37 U/L 12 12 32  ALT 0 - 53 U/L 9 6(L) 19  Alk Phosphatase 39 - 117 U/L 51 - 51  Total Bilirubin 0.2 - 1.2 mg/dL 0.8 0.4 1.0  Bilirubin, Direct 0.0 - 0.3 mg/dL - - -    Lab Results  Component Value  Date/Time   TSH 2.21 01/12/2019 03:53 PM   TSH 2.346 11/20/2017 10:05 AM   TSH 1.63 12/15/2015 10:35 AM    CBC Latest Ref Rng & Units 09/16/2020 06/30/2020 05/31/2020  WBC 4.0 - 10.5 K/uL 4.9 6.4 5.6  Hemoglobin 13.0 - 17.0 g/dL 13.2 13.2 13.5  Hematocrit 39.0 - 52.0 % 42.0 40.2 41.9  Platelets 150 - 400 K/uL 174 178.0 164    Lab Results  Component Value Date/Time   VD25OH 31 09/06/2008 08:59 PM    Clinical ASCVD: Yes  The ASCVD Risk score Mikey Bussing DC Jr., et al., 2013) failed to calculate for the following reasons:   The 2013 ASCVD risk score is only valid for ages 19 to 30    Depression screen PHQ 2/9 09/15/2020 12/14/2019 06/25/2019  Decreased Interest 0 0 0  Down, Depressed, Hopeless 0 0 0  PHQ - 2 Score 0 0 0  Some recent data might be hidden    CHA2DS2-VASc Score = 5  The patient's score is based upon: CHF History: Yes HTN History: Yes Diabetes History: No Stroke History: No Vascular Disease History: Yes Age Score: 2 Gender Score: 0      Social History   Tobacco Use  Smoking Status Former Smoker  . Packs/day: 1.50  . Years: 60.00  . Pack years: 90.00  . Types: Cigarettes  . Quit date: 07/25/2008  . Years since quitting: 12.2  Smokeless Tobacco Never Used   BP Readings from Last 3 Encounters:  09/16/20 140/60  09/15/20 104/60  08/25/20 132/66   Pulse Readings from Last 3 Encounters:  09/16/20 (!) 52  09/15/20 83  08/25/20 66   Wt Readings from Last 3 Encounters:  09/16/20 179 lb 12.8 oz (81.6 kg)  09/15/20 178 lb 9.6 oz (81 kg)  08/25/20 180 lb (81.6 kg)   BMI Readings from Last 3 Encounters:  09/16/20 26.55 kg/m  09/15/20 26.37 kg/m  08/25/20 26.58 kg/m    Assessment/Interventions: Review of patient past medical history, allergies, medications, health status, including review of consultants reports, laboratory and other test data, was performed as part of comprehensive evaluation and provision of chronic care management services.   SDOH:   (Social Determinants of Health) assessments and interventions performed: Yes  SDOH Screenings   Alcohol Screen: Low Risk   . Last Alcohol Screening Score (AUDIT): 0  Depression (PHQ2-9): Low Risk   . PHQ-2 Score: 0  Financial Resource Strain: Low Risk   . Difficulty of Paying Living Expenses: Not hard at all  Food Insecurity: No Food Insecurity  . Worried About Charity fundraiser in the Last Year: Never true  . Ran Out of Food in the Last Year: Never true  Housing: Low Risk   . Last Housing Risk Score: 0  Physical Activity: Sufficiently Active  . Days of Exercise per Week: 5 days  . Minutes of Exercise per Session: 30 min  Social Connections: Socially Integrated  .  Frequency of Communication with Friends and Family: More than three times a week  . Frequency of Social Gatherings with Friends and Family: More than three times a week  . Attends Religious Services: More than 4 times per year  . Active Member of Clubs or Organizations: Yes  . Attends Archivist Meetings: More than 4 times per year  . Marital Status: Married  Stress: No Stress Concern Present  . Feeling of Stress : Not at all  Tobacco Use: Medium Risk  . Smoking Tobacco Use: Former Smoker  . Smokeless Tobacco Use: Never Used  Transportation Needs: No Transportation Needs  . Lack of Transportation (Medical): No  . Lack of Transportation (Non-Medical): No    CCM Care Plan  Allergies  Allergen Reactions  . Lipitor [Atorvastatin] Other (See Comments)    Muscle pain  . Other Rash and Other (See Comments)    Detergent- Rashes on the skin    Medications Reviewed Today    Reviewed by Charlton Haws, [facility] (Pharmacist) on 10/09/20 at 1423  Med List Status: <None>  Medication Order Taking? Sig Documenting Provider Last Dose Status Informant  acetaminophen (TYLENOL) 500 MG tablet [id number] Yes Take 500-1,000 mg by mouth every 6 (six) hours as needed (for headaches). [provider] Taking Active  Self           Med Note Wilmon Pali, MELISSA R   Thu Apr 28, 2020  4:40 PM)    apixaban (ELIQUIS) 5 MG TABS tablet [id number] Yes Take 1 tablet (5 mg total) by mouth 2 (two) times daily. Larey Dresser, MD Taking Active   carvedilol (COREG) 6.25 MG tablet [id number] Yes Take 1.5 tablets (9.375 mg total) by mouth 2 (two) times daily. [name], MD Taking Active   Cholecalciferol (VITAMIN D-3) 25 MCG (1000 UT) CAPS [id number] Yes Take 1,000 Units by mouth daily. [provider] Taking Active Self  clobetasol (TEMOVATE) 0.05 % external solution [id number] Yes Apply 1 application topically See admin instructions. Apply to affected areas of the back twice weekly [provider] Taking Active Self  dofetilide (TIKOSYN) 125 MCG capsule [id number] Yes TAKE 1 CAPSULE BY MOUTH TWICE DAILY Larey Dresser, MD Taking Active   fexofenadine (ALLEGRA) 180 MG tablet [id number] Yes Take 180 mg by mouth daily as needed for allergies or rhinitis.  [provider] Taking Active Self           Med Note Elveria Royals May 16, 2020  2:43 PM)    fluticasone Asencion Islam) 50 MCG/ACT nasal spray [id number] Yes Place 2 sprays into both nostrils daily as needed for allergies or rhinitis. [name], MD Taking Active Self  hydrocortisone (ANUSOL-HC) 2.5 % rectal cream [id number] Yes Place 1 application rectally 3 (three) times daily. E[place]wood, Amy S, PA-C Taking Active   Hypromellose (ARTIFICIAL TEARS OP) [id number] Yes Place 1 drop into both eyes 4 (four) times daily as needed (dry eyes). [provider] Taking Active Self  ipratropium (ATROVENT) 0.06 % nasal spray [id number] Yes Place 2 sprays into both nostrils 4 (four) times daily as needed for rhinitis. [name], MD Taking Active Self  levocetirizine (XYZAL) 5 MG tablet [id number] Yes Take 5 mg by mouth daily as needed for allergies. [provider] Taking Active Self           Med Note Elveria Royals May 16, 2020  2:43 PM)    nitroGLYCERIN (NITROSTAT) 0.4 MG  SL tablet [id number] Yes Place 1 tablet (0.4 mg total) under the tongue every 5 (five) minutes as needed for chest pain. [name], MD Taking Active Self  rosuvastatin (CRESTOR) 10 MG tablet [id number] Yes TAKE 1 TABLET (10 MG TOTAL) BY MOUTH DAILY. NEEDS APPT FOR FUTURE REFILLS [name], MD Taking Active   sacubitril-valsartan [facility]) 97-103 MG [id number] Yes Take 1 tablet by mouth 2 (two) times daily. [name], MD Taking Active   sodium chloride (OCEAN) 0.65 % SOLN nasal spray [id number] Yes Place 1-2 sprays into both nostrils as needed for congestion. [provider] Taking Active Self  spironolactone (ALDACTONE) 25 MG tablet [id number] Yes Take 2 tablets (50 mg total) by mouth daily. Larey Dresser, MD Taking Active   triamcinolone cream (KENALOG) 0.1 % [id number] Yes Apply 1 application topically daily as needed (to affected areas, for itching).  [provider] Taking Active Self  Wheat Dextrin (BENEFIBER DRINK [place]) [id number] Yes Take by mouth. Take 2 tablespoons daily [provider] Taking Active           Patient Active Problem List   Diagnosis Date Noted  . Flu vaccine need 02/24/2020  . Gross hematuria 02/24/2020  . Anemia, iron deficiency, inadequate dietary intake 12/29/2019  . Intrinsic eczema 01/12/2019  . Chronic intractable headache 06/13/2018  . CAD (coronary artery disease) 12/26/2017  . Left ventricular dysfunction 11/06/2017  . Persistent atrial fibrillation 10/02/2017  . Aortic atherosclerosis ([place]) 05/02/2017  . Hypertrophy of nasal turbinates 03/21/2017  . Primary osteoarthritis of both knees 03/13/2016  . Bradycardia 12/15/2015  . Routine general medical examination at a health care facility 08/16/2015  . Middle insomnia 08/15/2015  . Carotid artery disease- s/p RCE June 2013 11/01/2011  . PVC's (premature ventricular contractions) 05/07/2011  . Radiation  proctitis 03/30/2011  . Constipation, chronic 02/02/2011  . Cerebrovascular disease 09/08/2008  . Allergic rhinitis 09/08/2008  . Diabetes mellitus without complication ([place]) 36/62/9476  . Prostate cancer ([place]) 09/06/2008    Class: History of  . Hyperlipidemia with target LDL less than 70 07/02/2008  . Essential hypertension 07/02/2008  . GERD 07/02/2008  . Irritable bowel syndrome 07/02/2008  . LOW BACK PAIN SYNDROME 07/02/2008    Immunization History  Administered Date(s) Administered  . Fluad Quad(high Dose 65+) 02/27/2019, 02/23/2020  . Influenza Split 03/09/2011, 03/06/2012, 03/11/2013  . Influenza Whole 03/15/2009, 03/17/2010  . Influenza, High Dose Seasonal PF 03/08/2016, 03/19/2017, 03/19/2018  . Influenza,inj,Quad PF,6+ Mos 03/19/2014  . Influenza-Unspecified 02/26/2015  . PFIZER(Purple Top)SARS-COV-2 Vaccination 06/07/2019, 06/28/2019  . Pneumococcal Conjugate-13 08/15/2015  . Pneumococcal Polysaccharide-23 05/07/2011, 02/23/2020  . Tdap 05/06/2012  . Zoster 08/20/2012  . Zoster Recombinat (Shingrix) 01/23/2019, 04/14/2019    Conditions to be addressed/monitored:  Hypertension, Hyperlipidemia, Diabetes, Atrial Fibrillation, Heart Failure, Coronary Artery Disease, Chronic Kidney Disease and Allergic Rhinitis  Care Plan : CCM Pharmacy Care Plan  Updates made by Charlton Haws, RPH since 10/09/2020 12:00 AM    Problem: Hypertension, Hyperlipidemia, Diabetes, Atrial Fibrillation, Heart Failure, Coronary Artery Disease, Chronic Kidney Disease and Allergic Rhinitis   Priority: High    Long-Range Goal: Disease management   Start Date: 10/03/2020  Expected End Date: 10/09/2021  This Visit's Progress: On track  Priority: High  Note:   Current Barriers:  . Unable to independently monitor therapeutic efficacy  Pharmacist Clinical Goal(s):  Marland Kitchen Patient will achieve adherence to monitoring guidelines and medication adherence to achieve therapeutic efficacy through  collaboration with PharmD and provider.  Interventions: . 1:1 collaboration with [name], MD regarding development and update of comprehensive plan of care as evidenced by provider attestation and co-signature . Inter-disciplinary care team collaboration (see longitudinal plan of care) . Comprehensive medication review performed; medication list updated in electronic medical record  Heart Failure / HTN (Goal:BP < 140/90, prevent exacerbations) -Controlled - pt checks BP twice daily and reports BP is at goal on average -Last ejection fraction: 50-55% (Date: 04/2020) -HF type: Systolic -NYHA Class: II (slight limitation of activity) -Current treatment: . Carvedilol 6.25 mg - 1.5 tab BID . Entresto 97-103 mg BID . Spironolactone 25 mg - 2 tab daily -Medications previously tried: amlodipine   -Current home BP/HR readings:   5/6 126/60, 47  127/56, 53  5/7 128/56, 49  133/51, 49  5/8 153/79, 63  135/65, 36  -Educated on Benefits of medications for managing symptoms and prolonging life Importance of blood pressure control -Recommended to continue current medication  Hyperlipidemia / CAD: (LDL goal < 70) -Controlled - LDL is at goal; pt endorses compliance and denies issues -Current treatment: . Rosuvastatin 10 mg daily . Nitroglycerin 0.4 mg SL prn - not used -Current exercise habits: walks 4-5 days per week -Educated on Cholesterol goals;  Benefits of statin for ASCVD risk reduction; -Recommended to continue current medication  Atrial Fibrillation (Goal: prevent stroke and major bleeding) -Controlled - gets Tikosyn from [facility] $125/3 mos -CHADSVASC: 5 -Current treatment: . Rate control: Carvedilol 6.25 mg, Dofetilide 125 mcg BID . Anticoagulation: Eliquis 5 mg BID -Counseled on increased risk of stroke due to Afib and benefits of anticoagulation for stroke prevention; importance of adherence to anticoagulant exactly as prescribed; -Assessed patient finances. tier  exception for dofetilide may reduce cost to patient  -Recommend to continue current medication  Diabetes (A1c goal <7%) -Diet-controlled -Denies hypoglycemic/hyperglycemic symptoms -Educated on A1c and blood sugar goals;  Allergic rhinitis (Goal: manage symptoms) -Controlled - Patient is satisfied with current regimen and denies issues -Current treatment  . Fexofenadine 180 mg daily PRN . Fluticasone nasal spray  . Ipratropium 0.06% nasal spray . Levocetirizine 5 mg daily . Saline nasal spray . Triamcinolone nasal spray PM -Recommended to continue current medication  Health Maintenance -Vaccine gaps: none -Current therapy:  . Clobetasol 0.05% solution - not taking . Hydrocortisone 2.5% rectal cream - not taking . Triamcinolone 0.1% cream - not taking . Artificial tears . Vitamin D 1000 IU daily . Benefiber -Patient is satisfied with current therapy and denies issues -Recommended to continue current medication  Patient Goals/Self-Care Activities . Patient will:  - take medications as prescribed focus on medication adherence by pill box check blood pressure daily, document, and provide at future appointments collaborate with provider on medication access solutions  Follow Up Plan: Telephone follow up appointment with care management team member scheduled for: 6 months      Medication Assistance: None required.  Patient affirms current coverage meets needs.  Patient's preferred pharmacy is:  [facility] [place], [place] [address] Phone: [phone] Fax: [phone]  [id number]: [phone]: [phone] [facility] [address] [address]Phone: [phone]: [phone] [facility]  [facility] [place]- [address]  [place] [address] Phone: [phone] Fax: [phone]  Uses pill box? Yes Pt endorses 100% compliance  We discussed: Current pharmacy is preferred with insurance plan and patient is satisfied with pharmacy services Patient decided to: Continue current medication management strategy  Care Plan and Follow Up Patient Decision:  Patient agrees to Care Plan and Follow-up.  Plan: Telephone follow up appointment with care management team member scheduled for:  6 months  Charlene Brooke, PharmD, [place], CPP Clinical Pharmacist Between Primary Care at [facility] [phone]

## 2020-10-05 ENCOUNTER — Ambulatory Visit (HOSPITAL_COMMUNITY)
Admission: RE | Admit: 2020-10-05 | Discharge: 2020-10-05 | Disposition: A | Discharge status: Home / Self Care | Payer: HMO | Source: Ambulatory Visit | Attending: Cardiovascular Disease | Admitting: Cardiovascular Disease

## 2020-10-05 ENCOUNTER — Other Ambulatory Visit: Payer: Self-pay

## 2020-10-05 ENCOUNTER — Other Ambulatory Visit (HOSPITAL_COMMUNITY): Payer: Self-pay | Admitting: Cardiovascular Disease

## 2020-10-05 DIAGNOSIS — I6521 Occlusion and stenosis of right carotid artery: Secondary | ICD-10-CM | POA: Insufficient documentation

## 2020-10-05 DIAGNOSIS — I6523 Occlusion and stenosis of bilateral carotid arteries: Secondary | ICD-10-CM

## 2020-10-05 DIAGNOSIS — I6522 Occlusion and stenosis of left carotid artery: Secondary | ICD-10-CM | POA: Insufficient documentation

## 2020-10-06 ENCOUNTER — Other Ambulatory Visit: Payer: Self-pay

## 2020-10-06 MED ORDER — APIXABAN 5 MG PO TABS: 5.0000 mg | ORAL_TABLET | Freq: Two times a day (BID) | ORAL | 1 refills | Status: DC

## 2020-10-06 NOTE — Telephone Encounter (Signed)
2m, 81.6kg, scr 1.16 09/30/20, lovw/mclean 09/16/20

## 2020-10-09 NOTE — Patient Instructions (Addendum)
Visit Information  Phone number for Pharmacist: [phone]  Thank you for meeting with me to discuss your medications! I look forward to working with you to achieve your health care goals. Below is a summary of what we talked about during the visit:  Goals Addressed            This Visit's Progress   . Manage My Medicine       Timeframe:  Long-Range Goal Priority:  Medium Start Date:      10/03/20                       Expected End Date: 10/03/21                       Follow Up Date 04/26/21   - call for medicine refill 2 or 3 days before it runs out - call if I am sick and can't take my medicine - keep a list of all the medicines I take; vitamins and herbals too - use a pillbox to sort medicine    Why is this important?   . These steps will help you keep on track with your medicines.   Notes:        Mr. Dillon Young was given information about Chronic Care Management services today including:  1. CCM service includes personalized support from designated clinical staff supervised by his physician, including individualized plan of care and coordination with other care providers 2. 24/7 contact phone numbers for assistance for urgent and routine care needs. 3. Standard insurance, coinsurance, copays and deductibles apply for chronic care management only during months in which we provide at least 20 minutes of these services. Most insurances cover these services at 100%, however patients may be responsible for any copay, coinsurance and/or deductible if applicable. This service may help you avoid the need for more expensive face-to-face services. 4. Only one practitioner may furnish and bill the service in a calendar month. 5. The patient may stop CCM services at any time (effective at the end of the month) by phone call to the office staff.  Patient agreed to services and verbal consent obtained.   Patient verbalizes understanding of instructions provided today and agrees to view in  [place].  Telephone follow up appointment with pharmacy team member scheduled for: 6 months  Charlene Brooke, PharmD, Para [place], CPP Clinical Pharmacist [facility] [place] Maintenance, Male Adopting a healthy lifestyle and getting preventive care are important in promoting health and wellness. Ask your health care provider about:  The right schedule for you to have regular tests and exams.  Things you can do on your own to prevent diseases and keep yourself healthy. What should I know about diet, weight, and exercise? Eat a healthy diet  Eat a diet that includes plenty of vegetables, fruits, low-fat dairy products, and lean protein.  Do not eat a lot of foods that are high in solid fats, added sugars, or sodium.   Maintain a healthy weight Body mass index (BMI) is a measurement that can be used to identify possible weight problems. It estimates body fat based on height and weight. Your health care provider can help determine your BMI and help you achieve or maintain a healthy weight. Get regular exercise Get regular exercise. This is one of the most important things you can do for your health. Most adults should:  Exercise for at least 150 minutes each week. The  exercise should increase your heart rate and make you sweat (moderate-intensity exercise).  Do strengthening exercises at least twice a week. This is in addition to the moderate-intensity exercise.  Spend less time sitting. Even light physical activity can be beneficial. Watch cholesterol and blood lipids Have your blood tested for lipids and cholesterol at 85 years of age, then have this test every 5 years. You may need to have your cholesterol levels checked more often if:  Your lipid or cholesterol levels are high.  You are older than 85 years of age.  You are at high risk for heart disease. What should I know about cancer screening? Many types of cancers can be detected early  and may often be prevented. Depending on your health history and family history, you may need to have cancer screening at various ages. This may include screening for:  Colorectal cancer.  Prostate cancer.  Skin cancer.  Lung cancer. What should I know about heart disease, diabetes, and high blood pressure? Blood pressure and heart disease  High blood pressure causes heart disease and increases the risk of stroke. This is more likely to develop in people who have high blood pressure readings, are of African descent, or are overweight.  Talk with your health care provider about your target blood pressure readings.  Have your blood pressure checked: ? Every 3-5 years if you are 77-70 years of age. ? Every year if you are 45 years old or older.  If you are between the ages of 85 and 69 and are a current or former smoker, ask your health care provider if you should have a one-time screening for abdominal aortic aneurysm (AAA). Diabetes Have regular diabetes screenings. This checks your fasting blood sugar level. Have the screening done:  Once every three years after age 45 if you are at a normal weight and have a low risk for diabetes.  More often and at a younger age if you are overweight or have a high risk for diabetes. What should I know about preventing infection? Hepatitis B If you have a higher risk for hepatitis B, you should be screened for this virus. Talk with your health care provider to find out if you are at risk for hepatitis B infection. Hepatitis C Blood testing is recommended for:  Everyone born from [birth year] through 1963.  Anyone with known risk factors for hepatitis C. Sexually transmitted infections (STIs)  You should be screened each year for STIs, including gonorrhea and chlamydia, if: ? You are sexually active and are younger than 85 years of age. ? You are older than 85 years of age and your health care provider tells you that you are at risk for this type of  infection. ? Your sexual activity has changed since you were last screened, and you are at increased risk for chlamydia or gonorrhea. Ask your health care provider if you are at risk.  Ask your health care provider about whether you are at high risk for HIV. Your health care provider may recommend a prescription medicine to help prevent HIV infection. If you choose to take medicine to prevent HIV, you should first get tested for HIV. You should then be tested every 3 months for as long as you are taking the medicine. Follow these instructions at home: Lifestyle  Do not use any products that contain nicotine or tobacco, such as cigarettes, e-cigarettes, and chewing tobacco. If you need help quitting, ask your health care provider.  Do not use street  drugs.  Do not share needles.  Ask your health care provider for help if you need support or information about quitting drugs. Alcohol use  Do not drink alcohol if your health care provider tells you not to drink.  If you drink alcohol: ? Limit how much you have to 0-2 drinks a day. ? Be aware of how much alcohol is in your drink. In the [place] one drink equals one 12 oz bottle of beer (355 mL), one 5 oz glass of wine (148 mL), or one 1 oz glass of hard liquor (44 mL). General instructions  Schedule regular health, dental, and eye exams.  Stay current with your vaccines.  Tell your health care provider if: ? You often feel depressed. ? You have ever been abused or do not feel safe at home. Summary  Adopting a healthy lifestyle and getting preventive care are important in promoting health and wellness.  Follow your health care provider's instructions about healthy diet, exercising, and getting tested or screened for diseases.  Follow your health care provider's instructions on monitoring your cholesterol and blood pressure. This information is not intended to replace advice given to you by your health care provider. Make sure you  discuss any questions you have with your health care provider. Document Revised: 05/07/2018 Document Reviewed: 05/07/2018 Elsevier Patient Education  2021 [employer].

## 2020-10-10 MED ORDER — SACUBITRIL-VALSARTAN 97-103 MG PO TABS: 1.0000 | ORAL_TABLET | Freq: Two times a day (BID) | ORAL | 1 refills | Status: DC

## 2020-10-12 ENCOUNTER — Encounter: Payer: Self-pay | Admitting: Internal Medicine

## 2020-10-12 ENCOUNTER — Other Ambulatory Visit: Payer: Self-pay | Admitting: Internal Medicine

## 2020-10-12 DIAGNOSIS — L218 Other seborrheic dermatitis: Secondary | ICD-10-CM | POA: Diagnosis not present

## 2020-10-12 DIAGNOSIS — L820 Inflamed seborrheic keratosis: Secondary | ICD-10-CM | POA: Diagnosis not present

## 2020-10-12 DIAGNOSIS — D225 Melanocytic nevi of trunk: Secondary | ICD-10-CM | POA: Diagnosis not present

## 2020-10-12 DIAGNOSIS — J301 Allergic rhinitis due to pollen: Secondary | ICD-10-CM

## 2020-10-12 MED ORDER — LEVOCETIRIZINE DIHYDROCHLORIDE 5 MG PO TABS: 5.0000 mg | ORAL_TABLET | Freq: Every evening | ORAL | 1 refills | Status: DC

## 2020-11-03 ENCOUNTER — Ambulatory Visit: Payer: HMO | Admitting: Physician Assistant

## 2020-11-03 ENCOUNTER — Telehealth: Payer: Self-pay | Admitting: Internal Medicine

## 2020-11-03 NOTE — Telephone Encounter (Signed)
MyChart message in regard was sent to PCP.

## 2020-11-03 NOTE — Telephone Encounter (Signed)
Patient seeking advice for tic bite on ankle Area red and raised  Please call

## 2020-11-04 ENCOUNTER — Other Ambulatory Visit (HOSPITAL_COMMUNITY): Payer: Self-pay | Admitting: Cardiology

## 2020-11-04 ENCOUNTER — Other Ambulatory Visit: Payer: Self-pay | Admitting: Cardiovascular Disease

## 2020-11-25 ENCOUNTER — Other Ambulatory Visit: Payer: Self-pay | Admitting: Internal Medicine

## 2020-11-25 DIAGNOSIS — I519 Heart disease, unspecified: Secondary | ICD-10-CM

## 2020-11-25 DIAGNOSIS — I10 Essential (primary) hypertension: Secondary | ICD-10-CM

## 2020-12-01 ENCOUNTER — Ambulatory Visit (INDEPENDENT_AMBULATORY_CARE_PROVIDER_SITE_OTHER): Payer: HMO | Admitting: Otolaryngology

## 2020-12-01 ENCOUNTER — Other Ambulatory Visit: Payer: Self-pay

## 2020-12-01 DIAGNOSIS — H903 Sensorineural hearing loss, bilateral: Secondary | ICD-10-CM | POA: Diagnosis not present

## 2020-12-01 DIAGNOSIS — J310 Chronic rhinitis: Secondary | ICD-10-CM

## 2020-12-01 DIAGNOSIS — L299 Pruritus, unspecified: Secondary | ICD-10-CM | POA: Diagnosis not present

## 2020-12-01 DIAGNOSIS — H6123 Impacted cerumen, bilateral: Secondary | ICD-10-CM

## 2020-12-01 NOTE — Progress Notes (Signed)
HPI: Dillon Young is a 87 y.o. male who returns today for evaluation of nasal and ear complaints.  He is a breathing much better since last visit 2 months ago.  I performed nasal endoscopy at that time and since that time he has been breathing much better for several weeks.  He is now using Nasacort 2 sprays each nostril at night and is breathing better.  He wears bilateral hearing aids.  He has had some intermittent sounds in the left ear that almost sound like a beating drum.  He also complains of itching in both ears and wanted eardrops for itching in the ears.  He has not noted any change in his hearing but wears bilateral hearing aids..  Past Medical History:  Diagnosis Date   Adenomatous colon polyp    Allergic rhinitis    Anemia    Anxiety    BPH (benign prostatic hyperplasia)    CAD (coronary artery disease)    sees Dr. [name]    Carotid artery disease [facility])    CHF (congestive heart failure) ([place])    Chronic kidney disease    Sones. Prostate cancer   Clotting disorder (HCC)    CVA (cerebral infarction)    Diabetes mellitus    "Borderline"   Diverticulitis    Dysrhythmia    atrial fib - hx of    Edema    recent swelling in feet and ankles    GERD (gastroesophageal reflux disease)    history   Headache    Hemorrhoids    History of kidney stones    HTN (hypertension)    Hx of radiation therapy    for prostate - 2013    Hyperlipidemia LDL goal <70    IBS (irritable bowel syndrome)    Low back pain    Pre-diabetes    Prostate cancer ([employer]) 2008   seed implantation   Sleep apnea    sitting in recliner helps him to breathe per pt , never used cpap sleep study - pt reports could not go to sleep- 6 years ago    Smoker    Stroke [facility])    hx of mini strokes years ago    Past Surgical History:  Procedure Laterality Date   APPENDECTOMY     CARDIAC CATHETERIZATION N/A 01/16/2016   Procedure: Left Heart Cath and Coronary Angiography;  Surgeon: Jettie Booze,  MD;  Location: [place] [facility];  Service: Cardiovascular;  Laterality: N/A;   CARDIAC CATHETERIZATION N/A 01/16/2016   Procedure: Coronary Stent Intervention;  Surgeon: Jettie Booze, MD;  Location: [place] [facility];  Service: Cardiovascular;  Laterality: N/A;   CARDIOVERSION N/A 11/26/2017   Procedure: CARDIOVERSION;  Surgeon: [name], MD;  Location[facility] ENDOSCOPY;  Service: Cardiovascular;  Laterality: N/A;   CARDIOVERSION N/A 03/13/2018   Procedure: CARDIOVERSION;  Surgeon: [name], MD;  Location: [name] ENDOSCOPY;  Service: Cardiovascular;  Laterality: N/A;   CATARACT EXTRACTION W/ INTRAOCULAR LENS  IMPLANT, BILATERAL Bilateral    COLONOSCOPY  10/21/2008   diverticulosis, radiation proctitis, internal hemorrhoids   CORONARY ANGIOPLASTY     CORONARY STENT INTERVENTION N/A 12/26/2017   Procedure: CORONARY STENT INTERVENTION;  Surgeon: [name], MD;  Location: [place] [facility];  Service: Cardiovascular;  Laterality: N/A;   ENDARTERECTOMY  11/12/2011   Procedure: ENDARTERECTOMY CAROTID;  Surgeon: Rosetta Posner, MD;  [facility];  Service: Vascular;  Laterality: Right;   ESOPHAGOGASTRODUODENOSCOPY (EGD) WITH ESOPHAGEAL DILATION  HEMORRHOID BANDING     LEFT HEART CATH AND CORONARY ANGIOGRAPHY N/A 12/26/2017   Procedure: LEFT HEART CATH AND CORONARY ANGIOGRAPHY;  Surgeon: [name], MD;  Location: [place] [facility];  Service: Cardiovascular;  Laterality: N/A;   LITHOTRIPSY  X 3   LUMBAR LAMINECTOMY     TRANSURETHRAL RESECTION OF PROSTATE  10/2003   TRANSURETHRAL RESECTION OF PROSTATE N/A 05/31/2020   Procedure: CYSTOSCOPY TRANSURETHRAL RESECTION BIOPSY AND FULGURATION OF PROSTATE LESION  (TURP);  Surgeon: Irine Seal, MD;  Location: [facility] ORS;  Service: Urology;  Laterality: N/A;   Social History   Socioeconomic History   Marital status: Married    Spouse name: Not on file   Number of children: 2   Years of education: Not on file   Highest education  level: Not on file  Occupational History   Occupation: [employer]  Tobacco Use   Smoking status: Former    Packs/day: 1.50    Years: 60.00    Pack years: 90.00    Types: Cigarettes    Quit date: 07/25/2008    Years since quitting: 12.3   Smokeless tobacco: Never  Vaping Use   Vaping Use: Never used  Substance and Sexual Activity   Alcohol use: Not Currently   Drug use: No   Sexual activity: Not Currently  Other Topics Concern   Not on file  Social History Narrative   Not on file   Social Determinants of Health   Financial Resource Strain: Low Risk    Difficulty of Paying Living Expenses: Not hard at all  Food Insecurity: No Food Insecurity   Worried About Charity fundraiser in the Last Year: Never true   [place] in the Last Year: Never true  Transportation Needs: No Transportation Needs   Lack of Transportation (Medical): No   Lack of Transportation (Non-Medical): No  Physical Activity: Sufficiently Active   Days of Exercise per Week: 5 days   Minutes of Exercise per Session: 30 min  Stress: No Stress Concern Present   Feeling of Stress : Not at all  Social Connections: Socially Integrated   Frequency of Communication with Friends and Family: More than three times a week   Frequency of Social Gatherings with Friends and Family: More than three times a week   Attends Religious Services: More than 4 times per year   Active Member of [employer] or Organizations: Yes   Attends Music therapist: More than 4 times per year   Marital Status: Married   Family History  Problem Relation Age of Onset   Heart disease Father    Hypertension Mother    Asthma Paternal Grandmother    Colon cancer Neg Hx    Esophageal cancer Neg Hx    Stomach cancer Neg Hx    Rectal cancer Neg Hx    Liver cancer Neg Hx    Allergies  Allergen Reactions   Lipitor [Atorvastatin] Other (See Comments)    Muscle pain   Other Rash and Other (See Comments)     Detergent- Rashes on the skin   Prior to Admission medications   Medication Sig Start Date End Date Taking? Authorizing Provider  acetaminophen (TYLENOL) 500 MG tablet Take 500-1,000 mg by mouth every 6 (six) hours as needed (for headaches).    [provider]  apixaban (ELIQUIS) 5 MG TABS tablet Take 1 tablet (5 mg total) by mouth 2 (two) times daily. 10/06/20   Larey Dresser,  MD  carvedilol (COREG) 6.25 MG tablet TAKE 1 TABLET BY MOUTH 2 TIMES DAILY WITH A MEAL. 11/07/20   Larey Dresser, MD  Cholecalciferol (VITAMIN D-3) 25 MCG (1000 UT) CAPS Take 1,000 Units by mouth daily.    [provider]  clobetasol (TEMOVATE) 0.05 % external solution Apply 1 application topically See admin instructions. Apply to affected areas of the back twice weekly 10/08/19   [provider]  dofetilide (TIKOSYN) 125 MCG capsule TAKE 1 CAPSULE BY MOUTH TWICE DAILY 12/08/19   Larey Dresser, MD  fexofenadine (ALLEGRA) 180 MG tablet Take 180 mg by mouth daily as needed for allergies or rhinitis.     [provider]  fluticasone (FLONASE) 50 MCG/ACT nasal spray Place 2 sprays into both nostrils daily as needed for allergies or rhinitis. 01/25/20   [name], MD  hydrocortisone (ANUSOL-HC) 2.5 % rectal cream Place 1 application rectally 3 (three) times daily. 06/30/20   Esterwood, Amy S, PA-C  Hypromellose (ARTIFICIAL TEARS OP) Place 1 drop into both eyes 4 (four) times daily as needed (dry eyes).    [provider]  ipratropium (ATROVENT) 0.06 % nasal spray Place 2 sprays into both nostrils 4 (four) times daily as needed for rhinitis. 01/25/20   [name], MD  levocetirizine (XYZAL) 5 MG tablet Take 1 tablet (5 mg total) by mouth every evening. 10/12/20   [name], MD  nitroGLYCERIN (NITROSTAT) 0.4 MG SL tablet Place 1 tablet (0.4 mg total) under the tongue every 5 (five) minutes as needed for chest pain. 04/11/20   [name], MD  rosuvastatin (CRESTOR)  10 MG tablet TAKE 1 TABLET (10 MG TOTAL) BY MOUTH DAILY. NEEDS APPT FOR FUTURE REFILLS 11/04/20   [name], MD  sacubitril-valsartan (ENTRESTO) 97-103 MG Take 1 tablet by mouth 2 (two) times daily. 10/10/20   [name], MD  sodium chloride (OCEAN) 0.65 % SOLN nasal spray Place 1-2 sprays into both nostrils as needed for congestion.    [provider]  spironolactone (ALDACTONE) 25 MG tablet Take 2 tablets (50 mg total) by mouth daily. 09/16/20   Larey Dresser, MD  triamcinolone cream (KENALOG) 0.1 % Apply 1 application topically daily as needed (to affected areas, for itching).  01/06/19   [provider]  Wheat Dextrin (BENEFIBER DRINK MIX PO) Take by mouth. Take 2 tablespoons daily    [provider]     Positive ROS: Otherwise negative  All other systems have been reviewed and were otherwise negative with the exception of those mentioned in the HPI and as above.  Physical Exam: Constitutional: Alert, well-appearing, no acute distress Ears: External ears without lesions or tenderness. Ear canals with minimal wax buildup that was cleaned with curettes.  The right TM is retracted posteriorly but clear otherwise.  The left TM is clear with no middle ear space abnormality noted. Nasal: External nose without lesions. Septum with minimal deformity and mild rhinitis.  Clear nasal passages bilaterally with no polyps minimal edema in both middle meatus regions were clear..  Oral: Lips and gums without lesions. Tongue and palate mucosa without lesions. Posterior oropharynx clear. Neck: No palpable adenopathy or masses Respiratory: Breathing comfortably  Skin: No facial/neck lesions or rash noted.  Cerumen impaction removal  Date/Time: 12/01/2020 1:44 PM Performed by: Rozetta Nunnery, MD Authorized by: Rozetta Nunnery, MD   Consent:    Consent obtained:  Verbal   Consent given by:  Patient  Risks discussed:  Pain and bleeding Procedure  details:    Location:  L ear and R ear   Procedure type: curette   Post-procedure details:    Inspection:  TM intact and canal normal   Hearing quality:  Improved   Procedure completion:  Tolerated well, no immediate complications Comments:     TMs are clear bilaterally although he has a retraction pocket in the posterior aspect of the right TM.  No evidence of cholesteatoma or infection.  Assessment: Chronic itching in the ears in a patient who wears hearing aids. Chronic rhinitis doing much better today.  Plan: Patient doing better concerning nasal sinus symptoms. I sent a prescription for fluocinolone oil 0.01% eardrops to use 4 drops twice daily for 4 days as needed ear itching. He will follow-up as needed.   Radene Journey, MD

## 2020-12-03 MED ORDER — FLUOCINOLONE ACETONIDE 0.01 % EX CREA: TOPICAL_CREAM | Freq: Two times a day (BID) | CUTANEOUS | 0 refills | Status: DC

## 2020-12-09 ENCOUNTER — Ambulatory Visit: Payer: HMO | Admitting: Gastroenterology

## 2020-12-10 ENCOUNTER — Other Ambulatory Visit: Payer: Self-pay | Admitting: Internal Medicine

## 2020-12-10 DIAGNOSIS — J301 Allergic rhinitis due to pollen: Secondary | ICD-10-CM

## 2020-12-21 DIAGNOSIS — N3946 Mixed incontinence: Secondary | ICD-10-CM | POA: Diagnosis not present

## 2020-12-21 DIAGNOSIS — C674 Malignant neoplasm of posterior wall of bladder: Secondary | ICD-10-CM | POA: Diagnosis not present

## 2020-12-21 DIAGNOSIS — Z8546 Personal history of malignant neoplasm of prostate: Secondary | ICD-10-CM | POA: Diagnosis not present

## 2020-12-21 LAB — PSA: PSA: 0.015

## 2020-12-26 ENCOUNTER — Other Ambulatory Visit: Payer: Self-pay | Admitting: Urology

## 2020-12-26 ENCOUNTER — Telehealth: Payer: Self-pay | Admitting: Cardiovascular Disease

## 2020-12-26 NOTE — Telephone Encounter (Signed)
   [facility] Pre-operative Risk Assessment    Patient Name: Dillon Young  DOB: March 07, 1933 MRN: [id number]  HEARTCARE STAFF:  - IMPORTANT!!!!!! Under Visit Info/Reason for Call, type in Other and utilize the format Clearance MM/DD/YY or Clearance TBD. Do not use dashes or single digits. - Please review there is not already an duplicate clearance open for this procedure. - If request is for dental extraction, please clarify the # of teeth to be extracted. - If the patient is currently at the dentist's office, call Pre-Op Callback Staff (MA/nurse) to input urgent request.  - If the patient is not currently in the dentist office, please route to the Pre-Op pool.  Request for surgical clearance:  What type of surgery is being performed? Transurethral resection of bladder tumor  When is this surgery scheduled? 01/06/21  What type of clearance is required (medical clearance vs. Pharmacy clearance to hold med vs. Both)? Both   Are there any medications that need to be held prior to surgery and how long? Eliquis, 2 days prior   Practice name and name of physician performing surgery? [facility], Dr. Irine Seal   What is the office phone number? [phone]   7.   What is the office fax number?   [phone]  8.   Anesthesia type (None, local, MAC, general) ? [place] 12/26/2020, 2:49 PM  _________________________________________________________________   (provider comments below)

## 2020-12-26 NOTE — Telephone Encounter (Signed)
Clinical pharmacist to review Eliquis.  History of persistent atrial fibrillation, chronic systolic CHF, CAD, carotid artery disease.

## 2020-12-26 NOTE — Telephone Encounter (Signed)
Patient with diagnosis of afib on Eliquis for anticoagulation.    Procedure: Transurethral resection of bladder tumor Date of procedure: 01/06/21  CHA2DS2-VASc Score = 5  This indicates a 7.2% annual risk of stroke. The patient's score is based upon: CHF History: Yes HTN History: Yes Diabetes History: No Stroke History: No Vascular Disease History: Yes Age Score: 2 Gender Score: 0     CrCl 48.5 Platelet count 174  Per office protocol, patient can hold Eliquis for 2 days prior to procedure.

## 2020-12-27 NOTE — Patient Instructions (Addendum)
DUE TO COVID-19 ONLY ONE VISITOR IS ALLOWED TO COME WITH YOU AND STAY IN THE WAITING ROOM ONLY DURING PRE OP AND PROCEDURE.   **NO VISITORS ARE ALLOWED IN THE SHORT STAY AREA OR RECOVERY ROOM!!**       Your procedure is scheduled on: 01/06/21   Report to [facility] Main  Entrance    Report to admitting at 7:00 AM   Call this number if you have problems the morning of surgery [phone]   Do not eat food :After Midnight.   May have liquids until  6:00 AM  day of surgery  CLEAR LIQUID DIET  Foods Allowed                                                                     Foods Excluded  Water, Black Coffee and tea, regular and decaf               liquids that you cannot  Plain Jell-O in any flavor  (No red)                                     see through such as: Fruit ices (not with fruit pulp)                                            milk, soups, orange juice              Iced Popsicles (No red)                                                All solid food                                   Apple juices Sports drinks like Gatorade (No red) Lightly seasoned clear broth or consume(fat free) Sugar, honey syrup    Oral Hygiene is also important to reduce your risk of infection.                                    Remember - BRUSH YOUR TEETH THE MORNING OF SURGERY WITH YOUR REGULAR TOOTHPASTE   Take these medicines the morning of surgery with A SIP OF WATER: Tylenol, Coreg, Tirosyn,  Crestor.                               You may not have any metal on your body including hair pins, jewelry, and body piercing             Do not wear lotions, powders, cologne, or deodorant              Men may shave face and neck.   Do not bring  valuables to the hospital. [place].   Contacts, dentures or bridgework may not be worn into surgery.   Follow instructions given to you regarding Eliquis prior to surgery.    Patients  discharged the day of surgery will not be allowed to drive home.  Special Instructions: Bring a copy of your healthcare power of attorney and living will documents  the day of surgery if you haven't scanned them in before.  Please read over the following fact sheets you were given: IF YOU HAVE QUESTIONS ABOUT YOUR PRE OP INSTRUCTIONS PLEASE CALL [phone]- [facility] - Preparing for Surgery Before surgery, you can play an important role.  Because skin is not sterile, your skin needs to be as free of germs as possible.  You can reduce the number of germs on your skin by washing with CHG (chlorahexidine gluconate) soap before surgery.  CHG is an antiseptic cleaner which kills germs and bonds with the skin to continue killing germs even after washing. Please DO NOT use if you have an allergy to CHG or antibacterial soaps.  If your skin becomes reddened/irritated stop using the CHG and inform your nurse when you arrive at Short Stay. Do not shave (including legs and underarms) for at least 48 hours prior to the first CHG shower.  You may shave your face/neck.  Please follow these instructions carefully:  1.  Shower with CHG Soap the night before surgery and the  morning of surgery.  2.  If you choose to wash your hair, wash your hair first as usual with your normal  shampoo.  3.  After you shampoo, rinse your hair and body thoroughly to remove the shampoo.                             4.  Use CHG as you would any other liquid soap.  You can apply chg directly to the skin and wash.  Gently with a scrungie or clean washcloth.  5.  Apply the CHG Soap to your body ONLY FROM THE NECK DOWN.   Do   not use on face/ open                           Wound or open sores. Avoid contact with eyes, ears mouth and   genitals (private parts).                       Wash face,  Genitals (private parts) with your normal soap.             6.  Wash thoroughly, paying special attention to the area where your     surgery  will be performed.  7.  Thoroughly rinse your body with warm water from the neck down.  8.  DO NOT shower/wash with your normal soap after using and rinsing off the CHG Soap.                9.  Pat yourself dry with a clean towel.            10.  Wear clean pajamas.            11.  Place clean sheets on your bed the night of your first shower and do  not  sleep with pets. Day of Surgery : Do not apply any lotions/deodorants the morning of surgery.  Please wear clean clothes to the hospital/surgery center.  FAILURE TO FOLLOW THESE INSTRUCTIONS MAY RESULT IN THE CANCELLATION OF YOUR SURGERY  PATIENT SIGNATURE_________________________________  NURSE SIGNATURE__________________________________  ________________________________________________________________________

## 2020-12-27 NOTE — Telephone Encounter (Signed)
Spoke with his wife, patient is unable to take the phone at this time, will call back in 30 min

## 2020-12-27 NOTE — Telephone Encounter (Signed)
   Name: Dillon Young  DOB: 06/07/1932  MRN: [id number]   Primary Cardiologist: [name], MD  Chart reviewed as part of pre-operative protocol coverage. Patient was contacted 12/27/2020 in reference to pre-operative risk assessment for pending surgery as outlined below.  Dillon Young was last seen on 09/16/2020 by Dr. [name].  Since that day, Dillon Young has done well without chest pain or worsening dyspnea. He has been able to walk 30 min a day 5 days a week. He clearly is able to accomplish more than 4 METS of activity.  Please see recommendation by our clinical pharmacist regarding Eliquis.   Therefore, based on ACC/AHA guidelines, the patient would be at acceptable risk for the planned procedure without further cardiovascular testing.   The patient was advised that if he develops new symptoms prior to surgery to contact our office to arrange for a follow-up visit, and he verbalized understanding.  I will route this recommendation to the requesting party via Epic fax function and remove from pre-op pool. Please call with questions.  [place], [place] 12/27/2020, 1:29 PM

## 2020-12-27 NOTE — Progress Notes (Addendum)
COVID Vaccine Completed: yes x4 Date COVID Vaccine completed: 06/07/19, 06/28/19 Has received booster: COVID vaccine manufacturer: Pfizer      Date of COVID positive in last 90 days: No  PCP - [name], MD Cardiologist - [name], MD Dr Megan Salon  Cardiac Clearance note 12/27/20 by Almyra Deforest.  Chest x-ray - N/a EKG - 09/16/20 Epic Stress Test - 01/09/16 Epic ECHO - 05/16/20 Epic Cardiac Cath - 12/26/17 Epic Pacemaker/ICD device last checked: N/a Spinal Cord Stimulator: N/a  Sleep Study - pt denies sleep apnea CPAP - no CPAP  Fasting Blood Sugar - pre doesn't check sugars at home Checks Blood Sugar _____ times a day  Blood Thinner Instructions: Eliquis, hold 2 days prior to surgery Last Dose:01/03/21  Activity level: Can go up a flight of stairs and perform activities of daily living without stopping and without symptoms of chest pain or shortness of breath.         Anesthesia review: A fib, HTN, CAD, aortic atherosclerosis,   Patient denies shortness of breath, fever, cough and chest pain at PAT appointment   Patient verbalized understanding of instructions that were given to them at the PAT appointment. Patient was also instructed that they will need to review over the PAT instructions again at home before surgery.

## 2020-12-28 ENCOUNTER — Other Ambulatory Visit: Payer: Self-pay

## 2020-12-28 ENCOUNTER — Encounter (HOSPITAL_COMMUNITY)
Admission: RE | Admit: 2020-12-28 | Discharge: 2020-12-28 | Disposition: A | Discharge status: Home / Self Care | Payer: HMO | Source: Ambulatory Visit | Attending: Urology | Admitting: Urology

## 2020-12-28 ENCOUNTER — Encounter (HOSPITAL_COMMUNITY): Payer: Self-pay

## 2020-12-28 DIAGNOSIS — Z8673 Personal history of transient ischemic attack (TIA), and cerebral infarction without residual deficits: Secondary | ICD-10-CM | POA: Diagnosis not present

## 2020-12-28 DIAGNOSIS — I509 Heart failure, unspecified: Secondary | ICD-10-CM | POA: Insufficient documentation

## 2020-12-28 DIAGNOSIS — N189 Chronic kidney disease, unspecified: Secondary | ICD-10-CM | POA: Diagnosis not present

## 2020-12-28 DIAGNOSIS — D494 Neoplasm of unspecified behavior of bladder: Secondary | ICD-10-CM | POA: Insufficient documentation

## 2020-12-28 DIAGNOSIS — Z87891 Personal history of nicotine dependence: Secondary | ICD-10-CM | POA: Diagnosis not present

## 2020-12-28 DIAGNOSIS — Z01812 Encounter for preprocedural laboratory examination: Secondary | ICD-10-CM | POA: Diagnosis not present

## 2020-12-28 DIAGNOSIS — I130 Hypertensive heart and chronic kidney disease with heart failure and stage 1 through stage 4 chronic kidney disease, or unspecified chronic kidney disease: Secondary | ICD-10-CM | POA: Diagnosis not present

## 2020-12-28 DIAGNOSIS — Z79899 Other long term (current) drug therapy: Secondary | ICD-10-CM | POA: Insufficient documentation

## 2020-12-28 DIAGNOSIS — Z7901 Long term (current) use of anticoagulants: Secondary | ICD-10-CM | POA: Diagnosis not present

## 2020-12-28 DIAGNOSIS — Z8546 Personal history of malignant neoplasm of prostate: Secondary | ICD-10-CM | POA: Diagnosis not present

## 2020-12-28 LAB — CBC
HCT: 40.2 % (ref 39.0–52.0)
Hemoglobin: 12.7 g/dL — ABNORMAL LOW (ref 13.0–17.0)
MCH: 26.6 pg (ref 26.0–34.0)
MCHC: 31.6 g/dL (ref 30.0–36.0)
MCV: 84.1 fL (ref 80.0–100.0)
Platelets: 150 10*3/uL (ref 150–400)
RBC: 4.78 MIL/uL (ref 4.22–5.81)
RDW: 14.7 % (ref 11.5–15.5)
WBC: 5.6 10*3/uL (ref 4.0–10.5)
nRBC: 0 % (ref 0.0–0.2)

## 2020-12-28 LAB — HEMOGLOBIN A1C
Hgb A1c MFr Bld: 6.5 % — ABNORMAL HIGH (ref 4.8–5.6)
Mean Plasma Glucose: 139.85 mg/dL

## 2020-12-28 LAB — BASIC METABOLIC PANEL WITH GFR
Anion gap: 12 (ref 5–15)
BUN: 23 mg/dL (ref 8–23)
CO2: 28 mmol/L (ref 22–32)
Calcium: 9.4 mg/dL (ref 8.9–10.3)
Chloride: 102 mmol/L (ref 98–111)
Creatinine, Ser: 1 mg/dL (ref 0.61–1.24)
GFR, Estimated: >60 mL/min
Glucose, Bld: 100 mg/dL — ABNORMAL HIGH (ref 70–99)
Potassium: 4.7 mmol/L (ref 3.5–5.1)
Sodium: 142 mmol/L (ref 135–145)

## 2020-12-28 LAB — GLUCOSE, CAPILLARY: Glucose-Capillary: 100 mg/dL — ABNORMAL HIGH (ref 70–99)

## 2020-12-29 NOTE — Progress Notes (Signed)
Anesthesia Chart Review   Case: [id number] Date/Time: 01/06/21 0845   Procedure: TRANSURETHRAL RESECTION OF BLADDER TUMOR WITH GEMCITABINE BILATERAL RETROGRADES (Bilateral)   Anesthesia type: General   Pre-op diagnosis: BLADDER TUMOR   Location: [facility] ROOM 05 / [facility]   Surgeons: Irine Seal, MD       DISCUSSION:87 y.o. former smoker with h/o HTN, GERD, sleep apnea, CHR, CAD, CVA, atrial fibrillation (Eliquis), prostate cancer s/p radiation, CKD, bladder tumor scheduled for above procedure 01/06/2021 with Dr. Irine Seal.   Per cardiology preoperative evaluation 12/27/2020, "Chart reviewed as part of pre-operative protocol coverage. Patient was contacted 12/27/2020 in reference to pre-operative risk assessment for pending surgery as outlined below.  SARAN SCHUENEMAN was last seen on 09/16/2020 by Dr. [name].  Since that day, KEVONTAE SILVERIA has done well without chest pain or worsening dyspnea. He has been able to walk 30 min a day 5 days a week. He clearly is able to accomplish more than 4 METS of activity.   Please see recommendation by our clinical pharmacist regarding Eliquis.    Therefore, based on ACC/AHA guidelines, the patient would be at acceptable risk for the planned procedure without further cardiovascular testing."  Per pharmacy note 12/26/20 pt advised to hold Eliquis 2 days prior to procedure.  VS: BP (!) 159/58   Pulse 62   Temp 37 C (Oral)   Resp 16   Ht '5\' 9"'$  (1.753 m)   Wt 78.9 kg   SpO2 99%   BMI 25.70 kg/m   PROVIDERS: [name], MD is PCP   [name], MD is Cardiologist  LABS: Labs reviewed: Acceptable for surgery. (all labs ordered are listed, but only abnormal results are displayed)  Labs Reviewed  HEMOGLOBIN A1C - Abnormal; Notable for the following components:      Result Value   Hgb A1c MFr Bld 6.5 (*)    All other components within normal limits  BASIC METABOLIC PANEL - Abnormal; Notable for the following components:   Glucose, Bld 100 (*)     All other components within normal limits  CBC - Abnormal; Notable for the following components:   Hemoglobin 12.7 (*)    All other components within normal limits  GLUCOSE, CAPILLARY - Abnormal; Notable for the following components:   Glucose-Capillary 100 (*)    All other components within normal limits     IMAGES:   EKG: 09/16/20 Rate 56 bpm  Sinus bradycardia with 1st degree AV block with frequent PVCs  CV: Echo 05/16/2020 1. Left ventricular ejection fraction, by estimation, is 50 to 55%. The  left ventricle has low normal function. The left ventricle demonstrates  regional wall motion abnormalities (see scoring diagram/findings for  description). There is mild hypokinesis  of the posterolateral LV wall.There is mild concentric left ventricular  hypertrophy. Diastolic function indeterminant due to arrhythmia.   2. Right ventricular systolic function is normal. The right ventricular  size is normal.   3. Left atrial size was moderately dilated.   4. Right atrial size was mildly dilated.   5. The mitral valve is grossly normal. Mild mitral valve regurgitation.   6. The aortic valve is tricuspid. There is mild calcification of the  aortic valve. There is mild thickening of the aortic valve. Aortic valve  regurgitation is not visualized. Mild aortic valve sclerosis is present,  with no evidence of aortic valve  stenosis.   7. The inferior vena cava is normal in size with greater than 50%  respiratory variability, suggesting right atrial pressure of 3 mmHg.  Past Medical History:  Diagnosis Date   Adenomatous colon polyp    Allergic rhinitis    Anemia    Anxiety    BPH (benign prostatic hyperplasia)    CAD (coronary artery disease)    sees Dr. [name]    Carotid artery disease [facility])    CHF (congestive heart failure) ([place])    Chronic kidney disease    [place]. Prostate cancer   Clotting disorder ([facility])    CVA (cerebral infarction)    Diabetes mellitus     "Borderline"   Diverticulitis    Dysrhythmia    atrial fib - hx of    Edema    recent swelling in feet and ankles    GERD (gastroesophageal reflux disease)    history   Headache    Hemorrhoids    History of kidney stones    HTN (hypertension)    Hx of radiation therapy    for prostate - 2013    Hyperlipidemia LDL goal <70    IBS (irritable bowel syndrome)    Low back pain    Pre-diabetes    Prostate cancer ([place]) 2008   seed implantation   Sleep apnea    sitting in recliner helps him to breathe per pt , never used cpap sleep study - pt reports could not go to sleep- 6 years ago    Smoker    Stroke [facility])    hx of mini strokes years ago     Past Surgical History:  Procedure Laterality Date   APPENDECTOMY     CARDIAC CATHETERIZATION N/A 01/16/2016   Procedure: Left Heart Cath and Coronary Angiography;  Surgeon: Jettie Booze, MD;  Location: [place] CV LAB;  Service: Cardiovascular;  Laterality: N/A;   CARDIAC CATHETERIZATION N/A 01/16/2016   Procedure: Coronary Stent Intervention;  Surgeon: Jettie Booze, MD;  Location: [place] CV LAB;  Service: Cardiovascular;  Laterality: N/A;   CARDIOVERSION N/A 11/26/2017   Procedure: CARDIOVERSION;  Surgeon: [name], MD;  Location: [facility] ENDOSCOPY;  Service: Cardiovascular;  Laterality: N/A;   CARDIOVERSION N/A 03/13/2018   Procedure: CARDIOVERSION;  Surgeon: [name], MD;  Location: [facility] ENDOSCOPY;  Service: Cardiovascular;  Laterality: N/A;   CATARACT EXTRACTION W/ INTRAOCULAR LENS  IMPLANT, BILATERAL Bilateral    COLONOSCOPY  10/21/2008   diverticulosis, radiation proctitis, internal hemorrhoids   CORONARY ANGIOPLASTY     CORONARY STENT INTERVENTION N/A 12/26/2017   Procedure: CORONARY STENT INTERVENTION;  Surgeon: [name], MD;  Location: [place] CV LAB;  Service: Cardiovascular;  Laterality: N/A;   ENDARTERECTOMY  11/12/2011   Procedure: ENDARTERECTOMY CAROTID;  Surgeon: Rosetta Posner, MD;   Location: [place];  Service: Vascular;  Laterality: Right;   ESOPHAGOGASTRODUODENOSCOPY (EGD) WITH ESOPHAGEAL DILATION     HEMORRHOID BANDING     LEFT HEART CATH AND CORONARY ANGIOGRAPHY N/A 12/26/2017   Procedure: LEFT HEART CATH AND CORONARY ANGIOGRAPHY;  Surgeon: [name], MD;  Location: [place] CV LAB;  Service: Cardiovascular;  Laterality: N/A;   LITHOTRIPSY  X 3   LUMBAR LAMINECTOMY     TRANSURETHRAL RESECTION OF PROSTATE  10/2003   TRANSURETHRAL RESECTION OF PROSTATE N/A 05/31/2020   Procedure: CYSTOSCOPY TRANSURETHRAL RESECTION BIOPSY AND FULGURATION OF PROSTATE LESION  (TURP);  Surgeon: Irine Seal, MD;  Location[facility];  Service: Urology;  Laterality: N/A;    MEDICATIONS:  acetaminophen (TYLENOL) 500 MG tablet   apixaban (  ELIQUIS) 5 MG TABS tablet   carvedilol (COREG) 6.25 MG tablet   Cholecalciferol (VITAMIN D-3) 25 MCG (1000 UT) CAPS   dofetilide (TIKOSYN) 125 MCG capsule   fexofenadine (ALLEGRA) 180 MG tablet   fluocinolone (VANOS) 0.01 % cream   fluticasone (FLONASE) 50 MCG/ACT nasal spray   hydrocortisone (ANUSOL-HC) 2.5 % rectal cream   Hypromellose (ARTIFICIAL TEARS OP)   ipratropium (ATROVENT) 0.06 % nasal spray   levocetirizine (XYZAL) 5 MG tablet   nitroGLYCERIN (NITROSTAT) 0.4 MG SL tablet   rosuvastatin (CRESTOR) 10 MG tablet   sacubitril-valsartan (ENTRESTO) 97-103 MG   sodium chloride (OCEAN) 0.65 % SOLN nasal spray   spironolactone (ALDACTONE) 25 MG tablet   triamcinolone cream (KENALOG) 0.1 %   Wheat Dextrin (BENEFIBER DRINK MIX PO)   No current facility-administered medications for this encounter.     Konrad Felix, PA-C [facility] Pre-Surgical Testing [phone]

## 2020-12-29 NOTE — Anesthesia Preprocedure Evaluation (Deleted)
Anesthesia Evaluation    Airway        Dental   Pulmonary former smoker,           Cardiovascular hypertension,      Neuro/Psych    GI/Hepatic   Endo/Other  diabetes  Renal/GU      Musculoskeletal   Abdominal   Peds  Hematology   Anesthesia Other Findings   Reproductive/Obstetrics                             Anesthesia Physical Anesthesia Plan  ASA:   Anesthesia Plan:    Post-op Pain Management:    Induction:   PONV Risk Score and Plan:   Airway Management Planned:   Additional Equipment:   Intra-op Plan:   Post-operative Plan:   Informed Consent:   Plan Discussed with:   Anesthesia Plan Comments: (See PAT note 12/28/2020, Konrad Felix, PA-C)        Anesthesia Quick Evaluation

## 2020-12-30 ENCOUNTER — Telehealth (INDEPENDENT_AMBULATORY_CARE_PROVIDER_SITE_OTHER): Payer: HMO | Admitting: Internal Medicine

## 2020-12-30 DIAGNOSIS — J019 Acute sinusitis, unspecified: Secondary | ICD-10-CM | POA: Diagnosis not present

## 2020-12-30 DIAGNOSIS — E119 Type 2 diabetes mellitus without complications: Secondary | ICD-10-CM

## 2020-12-30 DIAGNOSIS — J301 Allergic rhinitis due to pollen: Secondary | ICD-10-CM | POA: Diagnosis not present

## 2020-12-30 MED ORDER — AZITHROMYCIN 250 MG PO TABS: ORAL_TABLET | ORAL | 0 refills | Status: AC

## 2020-12-30 MED ORDER — PREDNISONE 10 MG PO TABS: ORAL_TABLET | ORAL | 0 refills | Status: DC

## 2020-12-30 MED ORDER — HYDROCODONE BIT-HOMATROP MBR 5-1.5 MG/5ML PO SOLN: 5.0000 mL | Freq: Four times a day (QID) | ORAL | 0 refills | Status: AC | PRN

## 2020-12-30 NOTE — Progress Notes (Signed)
Patient ID: Dillon Young, male   DOB: [birth year], 87 y.o.   MRN: [id number]  Virtual Visit via Video Note  I connected with [name] on 12/30/20 at 10:40 AM EDT by a video enabled telemedicine application and verified that I am speaking with the correct person using two identifiers.  Location of all participants today Patient: at home Provider: at office   I discussed the limitations of evaluation and management by telemedicine and the availability of in person appointments. The patient expressed understanding and agreed to proceed.  History of Present Illness:  Here with 2-3 days acute onset fever, facial pain, pressure, headache, general weakness and malaise, and greenish d/c, with mild ST and cough, but pt denies chest pain, wheezing, increased sob or doe, orthopnea, PND, increased LE swelling, palpitations, dizziness or syncope.  Does have several wks ongoing nasal allergy symptoms with clearish congestion, itch and sneezing, without fever, pain, ST, cough, swelling or wheezing.   Pt denies polydipsia, polyuria, or new focal neuro s/s. Past Medical History:  Diagnosis Date   Adenomatous colon polyp    Allergic rhinitis    Anemia    Anxiety    BPH (benign prostatic hyperplasia)    CAD (coronary artery disease)    sees Dr. [name]    Carotid artery disease [facility])    CHF (congestive heart failure) ([place])    Chronic kidney disease    Sones. Prostate cancer   Clotting disorder (HCC)    CVA (cerebral infarction)    Diabetes mellitus    "Borderline"   Diverticulitis    Dysrhythmia    atrial fib - hx of    Edema    recent swelling in feet and ankles    GERD (gastroesophageal reflux disease)    history   Headache    Hemorrhoids    History of kidney stones    HTN (hypertension)    Hx of radiation therapy    for prostate - 2013    Hyperlipidemia LDL goal <70    IBS (irritable bowel syndrome)    Low back pain    Pre-diabetes    Prostate cancer ([place]) 2008   seed  implantation   Sleep apnea    sitting in recliner helps him to breathe per pt , never used cpap sleep study - pt reports could not go to sleep- 6 years ago    Smoker    Stroke [facility])    hx of mini strokes years ago    Past Surgical History:  Procedure Laterality Date   APPENDECTOMY     CARDIAC CATHETERIZATION N/A 01/16/2016   Procedure: Left Heart Cath and Coronary Angiography;  Surgeon: Jettie Booze, MD;  Location: [place] [facility];  Service: Cardiovascular;  Laterality: N/A;   CARDIAC CATHETERIZATION N/A 01/16/2016   Procedure: Coronary Stent Intervention;  Surgeon: Jettie Booze, MD;  Location: [place] [facility];  Service: Cardiovascular;  Laterality: N/A;   CARDIOVERSION N/A 11/26/2017   Procedure: CARDIOVERSION;  Surgeon: [name], MD;  Location: [facility] ENDOSCOPY;  Service: Cardiovascular;  Laterality: N/A;   CARDIOVERSION N/A 03/13/2018   Procedure: CARDIOVERSION;  Surgeon: [name], MD;  Location: [facility] ENDOSCOPY;  Service: Cardiovascular;  Laterality: N/A;   CATARACT EXTRACTION W/ INTRAOCULAR LENS  IMPLANT, BILATERAL Bilateral    COLONOSCOPY  10/21/2008   diverticulosis, radiation proctitis, internal hemorrhoids   CORONARY ANGIOPLASTY     CORONARY STENT INTERVENTION N/A 12/26/2017   Procedure: CORONARY STENT INTERVENTION;  Surgeon: [name],  Pearletha Forge, MD;  Location: [place] [facility];  Service: Cardiovascular;  Laterality: N/A;   ENDARTERECTOMY  11/12/2011   Procedure: ENDARTERECTOMY CAROTID;  Surgeon: Rosetta Posner, MD;  Location: [place];  Service: Vascular;  Laterality: Right;   ESOPHAGOGASTRODUODENOSCOPY (EGD) WITH ESOPHAGEAL DILATION     HEMORRHOID BANDING     LEFT HEART CATH AND CORONARY ANGIOGRAPHY N/A 12/26/2017   Procedure: LEFT HEART CATH AND CORONARY ANGIOGRAPHY;  Surgeon: [name], MD;  Location: [place] [facility];  Service: Cardiovascular;  Laterality: N/A;   LITHOTRIPSY  X 3   LUMBAR LAMINECTOMY     TRANSURETHRAL RESECTION OF PROSTATE  10/2003    TRANSURETHRAL RESECTION OF PROSTATE N/A 05/31/2020   Procedure: CYSTOSCOPY TRANSURETHRAL RESECTION BIOPSY AND FULGURATION OF PROSTATE LESION  (TURP);  Surgeon: Irine Seal, MD;  Location: [facility];  Service: Urology;  Laterality: N/A;    reports that he quit smoking about 12 years ago. His smoking use included cigarettes. He has a 90.00 pack-year smoking history. He has never used smokeless tobacco. He reports previous alcohol use. He reports that he does not use drugs. family history includes Asthma in his paternal grandmother; Heart disease in his father; Hypertension in his mother. Allergies  Allergen Reactions   Lipitor [Atorvastatin] Other (See Comments)    Muscle pain   Other Rash and Other (See Comments)    Detergent- Rashes on the skin   Current Outpatient Medications on File Prior to Visit  Medication Sig Dispense Refill   acetaminophen (TYLENOL) 500 MG tablet Take 500 mg by mouth every 6 (six) hours as needed for mild pain or headache.     apixaban (ELIQUIS) 5 MG TABS tablet Take 1 tablet (5 mg total) by mouth 2 (two) times daily. 180 tablet 1   carvedilol (COREG) 6.25 MG tablet TAKE 1 TABLET BY MOUTH 2 TIMES DAILY WITH A MEAL. (Patient taking differently: Take 9.375 mg by mouth in the morning and at bedtime.) 180 tablet 3   Cholecalciferol (VITAMIN D-3) 25 MCG (1000 UT) CAPS Take 1,000 Units by mouth daily.     dofetilide (TIKOSYN) 125 MCG capsule TAKE 1 CAPSULE BY MOUTH TWICE DAILY (Patient taking differently: Take 125 mcg by mouth 2 (two) times daily.) 180 capsule 3   fexofenadine (ALLEGRA) 180 MG tablet Take 180 mg by mouth daily as needed for allergies or rhinitis.      fluocinolone (VANOS) 0.01 % cream Apply topically 2 (two) times daily. (Patient taking differently: Apply 1 application topically 2 (two) times daily as needed (rash).) 30 g 0   fluticasone (FLONASE) 50 MCG/ACT nasal spray Place 2 sprays into both nostrils daily as needed for allergies or rhinitis. 48 g 1    hydrocortisone (ANUSOL-HC) 2.5 % rectal cream Place 1 application rectally 3 (three) times daily. (Patient taking differently: Place 1 application rectally 3 (three) times daily as needed for hemorrhoids.) 30 g 2   Hypromellose (ARTIFICIAL TEARS OP) Place 1 drop into both eyes 4 (four) times daily as needed (dry eyes).     ipratropium (ATROVENT) 0.06 % nasal spray PLACE 2 SPRAYS INTO BOTH NOSTRILS 4 (FOUR) TIMES DAILY AS NEEDED FOR RHINITIS. 90 mL 1   levocetirizine (XYZAL) 5 MG tablet Take 1 tablet (5 mg total) by mouth every evening. (Patient taking differently: Take 5 mg by mouth at bedtime as needed for allergies.) 90 tablet 1   nitroGLYCERIN (NITROSTAT) 0.4 MG SL tablet Place 1 tablet (0.4 mg total) under the tongue every 5 (five)  minutes as needed for chest pain. 25 tablet 3   rosuvastatin (CRESTOR) 10 MG tablet TAKE 1 TABLET (10 MG TOTAL) BY MOUTH DAILY. NEEDS APPT FOR FUTURE REFILLS (Patient taking differently: Take 10 mg by mouth daily.) 90 tablet 0   sacubitril-valsartan (ENTRESTO) 97-103 MG Take 1 tablet by mouth 2 (two) times daily. 180 tablet 1   sodium chloride (OCEAN) 0.65 % SOLN nasal spray Place 1-2 sprays into both nostrils as needed for congestion.     spironolactone (ALDACTONE) 25 MG tablet Take 2 tablets (50 mg total) by mouth daily. 180 tablet 3   triamcinolone cream (KENALOG) 0.1 % Apply 1 application topically daily as needed (to affected areas, for itching).      Wheat Dextrin (BENEFIBER DRINK MIX PO) Take 2 Scoops by mouth daily in the afternoon. Take 2 tablespoons daily     No current facility-administered medications on file prior to visit.    Observations/Objective: Alert, NAD, appropriate mood and affect, resps normal, cn 2-12 intact, moves all 4s, no visible rash or swelling Lab Results  Component Value Date   WBC 5.6 12/28/2020   HGB 12.7 (L) 12/28/2020   HCT 40.2 12/28/2020   PLT 150 12/28/2020   GLUCOSE 100 (H) 12/28/2020   CHOL 123 06/16/2020   TRIG 93  06/16/2020   HDL 51 06/16/2020   LDLCALC 53 06/16/2020   ALT 9 06/30/2020   AST 12 06/30/2020   NA 142 12/28/2020   K 4.7 12/28/2020   CL 102 12/28/2020   CREATININE 1.00 12/28/2020   BUN 23 12/28/2020   CO2 28 12/28/2020   TSH 2.21 01/12/2019   PSA 0.00 Repeated and verified X2. (L) 07/15/2019   INR 1.1 12/18/2017   HGBA1C 6.5 (H) 12/28/2020   MICROALBUR 5.7 01/06/2020   Assessment and Plan: See notes  Follow Up Instructions: See notes   I discussed the assessment and treatment plan with the patient. The patient was provided an opportunity to ask questions and all were answered. The patient agreed with the plan and demonstrated an understanding of the instructions.   The patient was advised to call back or seek an in-person evaluation if the symptoms worsen or if the condition fails to improve as anticipated.  Cathlean Cower, MD

## 2020-12-31 ENCOUNTER — Encounter: Payer: Self-pay | Admitting: Internal Medicine

## 2020-12-31 DIAGNOSIS — J019 Acute sinusitis, unspecified: Secondary | ICD-10-CM | POA: Insufficient documentation

## 2020-12-31 NOTE — Assessment & Plan Note (Signed)
Mild to mod, for antibx course,  to f/u any worsening symptoms or concerns 

## 2020-12-31 NOTE — Patient Instructions (Signed)
Please take all new medication as prescribed 

## 2020-12-31 NOTE — Assessment & Plan Note (Signed)
Lab Results  Component Value Date   HGBA1C 6.5 (H) 12/28/2020   Stable, pt to continue current medical treatment  - diet, wt control

## 2020-12-31 NOTE — Assessment & Plan Note (Signed)
/  Mild to mod, for predpac asd,  to f/u any worsening symptoms or concerns 

## 2021-01-01 DIAGNOSIS — Z03818 Encounter for observation for suspected exposure to other biological agents ruled out: Secondary | ICD-10-CM | POA: Diagnosis not present

## 2021-01-01 DIAGNOSIS — Z20822 Contact with and (suspected) exposure to covid-19: Secondary | ICD-10-CM | POA: Diagnosis not present

## 2021-01-05 NOTE — H&P (Signed)
I have bladder cancer.  HPI: Dillon Young is a 87 year-old male established patient who is here for bladder cancer.    12/21/20: [name] returns for the history noted below. he had a single episode of minimal hematuria about a month ago. He is otherwise doing well but has some mild incontinence requiring a pad. His IPSS is 10 with nocturia x 3. He has been seen by PT for the incontinence and is doing better.   09/21/20: [name] returns today in f/u. He completed BCG on 08/01/20 for his history of T1 HG NMIBC. He has had no hematuria but has some urgency with UUI and SUI requiring pads since the TURBT. He has no dysuria. He has a history of radiation cystitis from seeds for prostate cancer remotely. The last PSA was 0.0 on 07/10/19.   08/17/20: Patient with below noted past medical history. He presents today due to worsening urinary leakage. He reports that he has some control but is constantly dribbling. He has needed to wear a pad due to this. this has progressively worsesned since his surgery and undergoing BCG. He denies fevers, chills, gross hematuria. He has tolerated the BCG treatments well and has finished 6 rounds. He does complain of some pain in his pelvic area that radiates down. He feels he is emptying his bladder when voiding, but is unsure due to leakage. He also recently noticed a small spot on the glans of his penis that has not been present before. He reports that it is painless. He has not noticed any discharge, or drainage from the area.    [name] returns today in f/u from a recent TURBT of a lesion at the anterior bladder neck and prostatic urethra that proved to be a T1 HG NMIBC that I felt was well resected. He is having some postop frequency and urgency with small voids and some stress incontinence. He had no incontinence before hand. He has some initial hematuria. He has no other associated signs or symptoms.     CC: AUA Questions Scoring.  HPI:     AUA Symptom Score: Less  than 20% of the time he has the sensation of not emptying his bladder completely when finished urinating. Less than 20% of the time he has to urinate again fewer than two hours after he has finished urinating. Less than 50% of the time he has to start and stop again several times when he urinates. He never finds it difficult to postpone urination. 50% of the time he has a weak urinary stream. He never has to push or strain to begin urination. He has to get up to urinate 3 times from the time he goes to bed until the time he gets up in the morning.   Calculated AUA Symptom Score: 10    ALLERGIES: No Known Drug Allergies    MEDICATIONS: Amlodipine Besylate  Cholecalciferol  Clobetasol Propionate  Dofetilide  Eliquis 5 mg tablet  Fexofenadine Hcl  Fluticasone Propionate  Ipratropium Bromide  Nitroglycerin  Rosuvastatin Calcium  Spironolactone  Triamcinolone  Valsartan  Vitamin C  Vitamin D2     Notes: Pt forgot his medication list today. Denies changes from previous assessment.   GU PSH: Bladder Instill AntiCA Agent - 08/02/2020, 07/26/2020, 07/19/2020, 07/12/2020, 07/05/2020, 06/28/2020 Cystoscopy - 09/21/2020, 04/25/2020, 2019 Cystoscopy TURBT <2 cm - 05/31/2020 Cystoscopy Ureteroscopy - 2007 ESWL - 2007 Locm 300-'399Mg'$ /Ml Iodine,1Ml - 2019 TRANSPERI NEEDLE PLACE, PROS - 2009       PSH Notes: Colonoscopy (Fiberoptic), Surgery  Prostate Transperineal Placement Of Needles, Cystoscopy With Ureteroscopy, Lithotripsy, Cath Stent Placement, Back Surgery   NON-GU PSH: Cardiac Stent Placement Coronary Artery Bypass Grafting - 2017 Diagnostic Colonoscopy - 2017     GU PMH: Continuous incontinence - 09/26/2020, - 08/17/2020 Stress Incontinence - 09/26/2020 History of bladder cancer, No recurrent tumors are seen. He has some delayed healing in the prostatic urethra that is probably contributing to his urgency and the UA changes. He will return in 3 months for cystoscopy and possible BCG maintenance. -  09/21/2020 Mixed incontinence, He has some sphincter damage that is probably a combination of radiation effect and instrumentation. I will have him see PT. - 09/21/2020 Bladder Cancer overlapping sites - 08/17/2020 Dysuria - 08/17/2020 Urinary Frequency (Stable) - 08/17/2020, Urinary frequency, - 2014 Urinary Urgency - 08/17/2020 Bladder Cancer Anterior - 08/02/2020, - 07/26/2020, - 07/19/2020, - 07/12/2020, - 07/05/2020, - 06/28/2020, He has a T1 HG NMIBC that was well resected from the anterior bladder neck and prostatic urethra. I discussed the significance of this finding and the recommendation for BCG induction and maintenance. I have reviewed the side effects of BCG therapy in detail and will get him set up to return in 2 weeks to initiate treatment, with the delay because of some residual hematuria. He will then see me in 3 months for a cytology prior to f/u cystoscopy. , - 06/08/2020 History of prostate cancer - 07/05/2020, His PSA was undetectible at last check and there was no prostate cancer on the biopsy. , - 06/08/2020, His PSA remained undetectible earlier this year. , - 03/15/2020, History of prostate cancer, - 2017 Gross hematuria, Improving. - 06/08/2020, He has chronic intermittent hematuria and now has a lesion on the anterior prostate that is of concern for a neoplasm. I will get him set up for a TUR biopsy and fulguration. Risks of bleeding, infection, incontinence, strictures, thrombotic events and anesthetic complications reviewed. Cytology sent today. , - 04/25/2020, He continues to have gross hematuria and may have had some improvement with a reduction in the Eliquis dosage. I am going to have him return for cystoscopy to reassess the degree of radiation cystitis and to r/o tumors. , - 03/15/2020, - 02/05/2020, Stable renal calculi, bilateral renal cysts without obstrutive signs on u/s today. The bladder was not visualized., - 2020 (Worsening), He has bleeding that is probably from the radiation cystitis  and is facilitated by Eliquis and Plavix. I will have stop the plavix per a communciation with Dr. Marigene Ehlers and he will hold the Eliquis for a few days. He will return in 2 weeks. If he has persistent bleeding, he will need cystoscopy and fulguration and possible hyperbaric O2 therapy., - 2020 (Improving), - 2019, I am going to get him set up for CT hematuria study and cystoscopy. I will get a BUN/Cr today. If the bleeding becomes heavy, I will have him hold the plavix. The bleeding could be from his stone or radiation cystitis but I need to r/o neoplasm as well. , - 2019, Gross hematuria, - 2015, Gross Hematuria, - 2014 Prostate, Neoplasm of uncertain behavior - [id number] Nocturia, He has nocturia x 4. I recommended cutting out coffee and bladder irritants and if that doesn't work we can consider medications. - 03/15/2020, (Stable), - 02/05/2020 (Stable), - 2018, Nocturia, - 2017 Radiation cystitis (with hematuria) - 03/15/2020, The bleeding is most likely from the radiation change in the prostate with the associated stone in the prostatic urethral. He passed that after cystoscopy., - 2019  Renal calculus, He has a left renal stone but no symptoms. - 03/15/2020, - 02/05/2020, Nephrolithiasis, - 2017 Renal cyst - 02/05/2020, Renal cysts, acquired, bilateral, - 2014 Bladder Stone, 73m in prostatic urethra. - 2019 Prostate Cancer - 2018 LLQ pain, Abdominal pain, LLQ (left lower quadrant) - 2017 ED due to arterial insufficiency, Erectile dysfunction due to arterial insufficiency - 2016 Elevated PSA, PSA,Elevated - 2014 Urinary Retention, Unspec, Acute Urinary Retention - 2014      PMH Notes:  2006-05-24 16:43:03 - Note: Coronary Artery Disease  2012-07-24 13:53:37 - Note: Bladder Calculus   NON-GU PMH: Muscle weakness (generalized) - 09/26/2020 Other specified disorders of muscle - 09/26/2020 Bacteriuria, I covered him with cipro for the cysto and will check a culture but this is probably from the delayed  healing in the prostate. - 09/21/2020 Encounter for general adult medical examination without abnormal findings, Encounter for preventive health examination - 2017 Nausea, Nausea - 2017 Personal history of other diseases of the digestive system, History of diverticulitis of colon - 2017 Personal history of other diseases of the circulatory system, History of cardiac disorder - 2014, History of hypertension, - 2014 Personal history of other endocrine, nutritional and metabolic disease, History of hypercholesterolemia - 2014 Atrial Fibrillation    FAMILY HISTORY: Family Health Status Number - Father Urologic Disorder - Father   SOCIAL HISTORY: Marital Status: Married Preferred Language: English; Ethnicity: Not Hispanic Or Latino; Race: White Current Smoking Status: Patient does not smoke anymore. Has not smoked since 09/13/2008.   Tobacco Use Assessment Completed: Used Tobacco in last 30 days? Social Drinker.  Drinks 1 caffeinated drink per day.     Notes: Former smoker, Tobacco Use, Marital History - Currently Married, Caffeine Use, Death In The Family Mother, Death In The Family Father, Occupation:   REVIEW OF SYSTEMS:    GU Review Male:   Patient reports frequent urination, get up at night to urinate, leakage of urine, and stream starts and stops. Patient denies hard to postpone urination, burning/ pain with urination, trouble starting your stream, have to strain to urinate , erection problems, and penile pain.  Gastrointestinal (Upper):   Patient denies indigestion/ heartburn, nausea, and vomiting.  Gastrointestinal (Lower):   Patient denies diarrhea and constipation.  Constitutional:   Patient denies fever, night sweats, weight loss, and fatigue.  Skin:   Patient denies skin rash/ lesion and itching.  Eyes:   Patient denies blurred vision and double vision.  Ears/ Nose/ Throat:   Patient denies sore throat and sinus problems.  Hematologic/Lymphatic:   Patient denies swollen glands and  easy bruising.  Cardiovascular:   Patient denies leg swelling and chest pains.  Respiratory:   Patient denies cough and shortness of breath.  Endocrine:   Patient denies excessive thirst.  Musculoskeletal:   Patient denies back pain and joint pain.  Neurological:   Patient denies headaches and dizziness.  Psychologic:   Patient denies depression and anxiety.   Notes: Urgent urination    VITAL SIGNS: None   MULTI-SYSTEM PHYSICAL EXAMINATION:    Constitutional: Well-nourished. No physical deformities. Normally developed. Good grooming.  Respiratory: Normal breath sounds. No labored breathing, no use of accessory muscles.   Cardiovascular: RRR without murmur. Normal temperature, , no swelling, no varicosities.   Lymphatic: No enlargement, no tenderness of axillae, groin, neck lymph nodes.     Complexity of Data:  Records Review:   AUA Symptom Score, Previous Patient Records  Urine Test Review:   Urinalysis   07/10/19 09/09/17  09/04/16 08/27/15 08/17/14 07/15/13 06/25/12 07/18/11  PSA  Total PSA 0.0 ng/ml <0.015 ng/mL < 0.015 ng/dl <0.01  0.01  0.02  0.03  0.03     PROCEDURES:         Flexible Cystoscopy - 52000  Risks, benefits, and some of the potential complications of the procedure were discussed. 92m of 2% lidocaine jelly was instilled intraurethrally.  Cipro '500mg'$  given for antibiotic prophylaxis.     Meatus:  Normal size. Normal location. Normal condition.  Urethra:  No strictures.  External Sphincter:  Normal.  Verumontanum:  Normal.  Prostate:  Non-obstructing. No hyperplasia. there is some necrotic tissue anteriorly but no recurrent urothelial carcinoma is noted in the area of prior resection.   Bladder Neck:  Non-obstructing.  Ureteral Orifices:  Normal location. Normal size. Normal shape. Effluxed clear urine.  Bladder:  Mild trabeculation. A posterior wall tumor on the left. 1/2 cm tumor. Normal mucosa. No stones.      The procedure was well tolerated and there  were no complications.         Urinalysis w/Scope Dipstick Dipstick Cont'd Micro  Color: Yellow Bilirubin: Neg mg/dL WBC/hpf: 0 - 5/hpf  Appearance: Clear Ketones: Neg mg/dL RBC/hpf: NS (Not Seen)  Specific Gravity: 1.015 Blood: Neg ery/uL Bacteria: NS (Not Seen)  pH: 6.5 Protein: Neg mg/dL Cystals: NS (Not Seen)  Glucose: Neg mg/dL Urobilinogen: 0.2 mg/dL Casts: NS (Not Seen)    Nitrites: Neg Trichomonas: Not Present    Leukocyte Esterase: Trace leu/uL Mucous: Not Present      Epithelial Cells: NS (Not Seen)      Yeast: NS (Not Seen)      Sperm: Not Present    ASSESSMENT:      ICD-10 Details  1 GU:   History of bladder cancer - Z85.51 Chronic, Worsening - He did well with the BCG but has a small recurrence on the right posterior wall about 561min size. I will get him set up to go to the OR for cystoscopy with TURBT and to reassess the prior resection bed in the prostate. I will instill gemcitabine. I reviewed the risks of bleeding, infection, injury to the bladder and adjacent structures, need for secondary procedures, urine leaks, thrombotic events and anesthetic complications.   2   Bladder Cancer Posterior - C67.4 Undiagnosed New Problem  3   History of prostate cancer - Z85.46 Chronic, Stable - I will get a PSA as he is overdue.   4   Mixed incontinence - N39.46 Chronic, Stable - His prostate is widely patent and he empties well.    PLAN:           Orders Labs Cytology w/reflex FISH, PSA          Schedule Return Visit/Planned Activity: ASAP - Schedule Surgery  Procedure: Unspecified Date - Cystoscopy TURBT <2 cm - [id number]: next available          Document

## 2021-01-05 NOTE — Progress Notes (Signed)
Called pt about time change for surgery. He is to arrive 0630 for 0830 on 01/06/21. He verbalizes understanding.

## 2021-01-05 NOTE — Anesthesia Preprocedure Evaluation (Addendum)
Anesthesia Evaluation  Patient identified by MRN, date of birth, ID band Patient awake    Reviewed: Allergy & Precautions, NPO status , Patient's Chart, lab work & pertinent test results  History of Anesthesia Complications Negative for: history of anesthetic complications  Airway Mallampati: II  TM Distance: >3 FB Neck ROM: Full    Dental no notable dental hx. (+) Dental Advisory Given   Pulmonary sleep apnea , former smoker,    Pulmonary exam normal        Cardiovascular hypertension, Pt. on medications and Pt. on home beta blockers + CAD, + Cardiac Stents and +CHF  + dysrhythmias Atrial Fibrillation  Rhythm:Regular Rate:Normal  Per cardiology preoperative evaluation 12/27/2020, "Chart reviewed as part of pre-operative protocol coverage. Patient was contacted8/2/2022in reference to pre-operative risk assessment for pending surgery as outlined below. Randale Colton Towerywas last seen on 4/22/2022by Dr. [name]. Since that day, TRAVONE STROLE done well without chest pain or worsening dyspnea.He has been able to walk 30 min a day 5 days a week. He clearly is able to accomplish more than 4 METS of activity.  Please see recommendation by our clinical pharmacist regarding Eliquis.  Therefore, based on ACC/AHA guidelines, the patient would be at acceptable risk for the planned procedure without further cardiovascular testing."  IMPRESSIONS    1. Left ventricular ejection fraction, by estimation, is 50 to 55%. The  left ventricle has low normal function. The left ventricle demonstrates  regional wall motion abnormalities (see scoring diagram/findings for  description). There is mild hypokinesis  of the posterolateral LV wall.There is mild concentric left ventricular  hypertrophy. Diastolic function indeterminant due to arrhythmia.  2. Right ventricular systolic function is normal. The right ventricular  size is normal.   3. Left atrial size was moderately dilated.  4. Right atrial size was mildly dilated.  5. The mitral valve is grossly normal. Mild mitral valve regurgitation.  6. The aortic valve is tricuspid. There is mild calcification of the  aortic valve. There is mild thickening of the aortic valve. Aortic valve  regurgitation is not visualized. Mild aortic valve sclerosis is present,  with no evidence of aortic valve  stenosis.  7. The inferior vena cava is normal in size with greater than 50%  respiratory variability, suggesting right atrial pressure of 3 mmHg.   Comparison(s): Compared to prior echo in 10/2019, there is no significant  change.    Neuro/Psych  Headaches, Anxiety CVA, No Residual Symptoms    GI/Hepatic Neg liver ROS, GERD  Controlled,  Endo/Other  diabetes  Renal/GU negative Renal ROS     Musculoskeletal  (+) Arthritis ,   Abdominal   Peds  Hematology HLD   Anesthesia Other Findings   Reproductive/Obstetrics                           Anesthesia Physical  Anesthesia Plan  ASA: 3  Anesthesia Plan: General   Post-op Pain Management:    Induction: Intravenous  PONV Risk Score and Plan: 3 and Ondansetron, Dexamethasone and Treatment may vary due to age or medical condition  Airway Management Planned: LMA  Additional Equipment:   Intra-op Plan:   Post-operative Plan: Extubation in OR  Informed Consent: I have reviewed the patients History and Physical, chart, labs and discussed the procedure including the risks, benefits and alternatives for the proposed anesthesia with the patient or authorized representative who has indicated his/her understanding and acceptance.     Dental  advisory given  Plan Discussed with: Anesthesiologist and CRNA  Anesthesia Plan Comments:        Anesthesia Quick Evaluation

## 2021-01-06 ENCOUNTER — Encounter (HOSPITAL_COMMUNITY)
Admission: RE | Disposition: A | Discharge status: Home / Self Care | Payer: Self-pay | Source: Ambulatory Visit | Attending: Urology

## 2021-01-06 ENCOUNTER — Ambulatory Visit (HOSPITAL_COMMUNITY): Payer: HMO

## 2021-01-06 ENCOUNTER — Encounter (HOSPITAL_COMMUNITY): Payer: Self-pay | Admitting: Urology

## 2021-01-06 ENCOUNTER — Ambulatory Visit (HOSPITAL_COMMUNITY): Payer: HMO | Admitting: Anesthesiology

## 2021-01-06 ENCOUNTER — Ambulatory Visit (HOSPITAL_COMMUNITY)
Admission: RE | Admit: 2021-01-06 | Discharge: 2021-01-06 | Disposition: A | Discharge status: Home / Self Care | Payer: HMO | Source: Ambulatory Visit | Attending: Urology | Admitting: Urology

## 2021-01-06 ENCOUNTER — Ambulatory Visit (HOSPITAL_COMMUNITY): Payer: HMO | Admitting: Physician Assistant

## 2021-01-06 DIAGNOSIS — Z923 Personal history of irradiation: Secondary | ICD-10-CM | POA: Insufficient documentation

## 2021-01-06 DIAGNOSIS — C671 Malignant neoplasm of dome of bladder: Secondary | ICD-10-CM | POA: Diagnosis not present

## 2021-01-06 DIAGNOSIS — Z87891 Personal history of nicotine dependence: Secondary | ICD-10-CM | POA: Diagnosis not present

## 2021-01-06 DIAGNOSIS — C672 Malignant neoplasm of lateral wall of bladder: Secondary | ICD-10-CM | POA: Diagnosis not present

## 2021-01-06 DIAGNOSIS — Z951 Presence of aortocoronary bypass graft: Secondary | ICD-10-CM | POA: Insufficient documentation

## 2021-01-06 DIAGNOSIS — Z79899 Other long term (current) drug therapy: Secondary | ICD-10-CM | POA: Insufficient documentation

## 2021-01-06 DIAGNOSIS — Z955 Presence of coronary angioplasty implant and graft: Secondary | ICD-10-CM | POA: Insufficient documentation

## 2021-01-06 DIAGNOSIS — Z842 Family history of other diseases of the genitourinary system: Secondary | ICD-10-CM | POA: Diagnosis not present

## 2021-01-06 DIAGNOSIS — Z8546 Personal history of malignant neoplasm of prostate: Secondary | ICD-10-CM | POA: Diagnosis not present

## 2021-01-06 DIAGNOSIS — D494 Neoplasm of unspecified behavior of bladder: Secondary | ICD-10-CM | POA: Diagnosis not present

## 2021-01-06 DIAGNOSIS — I4891 Unspecified atrial fibrillation: Secondary | ICD-10-CM | POA: Diagnosis not present

## 2021-01-06 DIAGNOSIS — N3946 Mixed incontinence: Secondary | ICD-10-CM | POA: Insufficient documentation

## 2021-01-06 DIAGNOSIS — Z7901 Long term (current) use of anticoagulants: Secondary | ICD-10-CM | POA: Insufficient documentation

## 2021-01-06 DIAGNOSIS — I110 Hypertensive heart disease with heart failure: Secondary | ICD-10-CM | POA: Diagnosis not present

## 2021-01-06 DIAGNOSIS — Z419 Encounter for procedure for purposes other than remedying health state, unspecified: Secondary | ICD-10-CM

## 2021-01-06 DIAGNOSIS — I509 Heart failure, unspecified: Secondary | ICD-10-CM | POA: Diagnosis not present

## 2021-01-06 DIAGNOSIS — C679 Malignant neoplasm of bladder, unspecified: Secondary | ICD-10-CM | POA: Diagnosis not present

## 2021-01-06 DIAGNOSIS — Z8551 Personal history of malignant neoplasm of bladder: Secondary | ICD-10-CM | POA: Diagnosis not present

## 2021-01-06 HISTORY — PX: TRANSURETHRAL RESECTION OF BLADDER TUMOR WITH MITOMYCIN-C: SHX6459

## 2021-01-06 LAB — GLUCOSE, CAPILLARY
Glucose-Capillary: 111 mg/dL — ABNORMAL HIGH (ref 70–99)
Glucose-Capillary: 119 mg/dL — ABNORMAL HIGH (ref 70–99)

## 2021-01-06 LAB — PROTIME-INR
INR: 1.1 (ref 0.8–1.2)
Prothrombin Time: 13.7 seconds (ref 11.4–15.2)

## 2021-01-06 LAB — APTT: aPTT: 31 seconds (ref 24–36)

## 2021-01-06 SURGERY — TRANSURETHRAL RESECTION OF BLADDER TUMOR WITH MITOMYCIN-C
Anesthesia: General | Site: Bladder | Laterality: Bilateral

## 2021-01-06 MED ORDER — GEMCITABINE CHEMO FOR BLADDER INSTILLATION 2000 MG
2000.0000 mg | Freq: Once | INTRAVENOUS | Status: AC
Start: 2021-01-06 — End: 2021-01-06
  Administered 2021-01-06: 2000 mg via INTRAVESICAL
  Filled 2021-01-06: qty 2000

## 2021-01-06 MED ORDER — LIDOCAINE 2% (20 MG/ML) 5 ML SYRINGE
INTRAMUSCULAR | Status: DC | PRN
  Administered 2021-01-06: 100 mg via INTRAVENOUS

## 2021-01-06 MED ORDER — SODIUM CHLORIDE 0.9 % IR SOLN
Status: DC | PRN
  Administered 2021-01-06: 3000 mL

## 2021-01-06 MED ORDER — DEXAMETHASONE SODIUM PHOSPHATE 10 MG/ML IJ SOLN
INTRAMUSCULAR | Status: DC | PRN
  Administered 2021-01-06: 10 mg via INTRAVENOUS

## 2021-01-06 MED ORDER — CEFAZOLIN SODIUM-DEXTROSE 2-4 GM/100ML-% IV SOLN
2.0000 g | INTRAVENOUS | Status: AC
  Administered 2021-01-06: 2 g via INTRAVENOUS
  Filled 2021-01-06: qty 100

## 2021-01-06 MED ORDER — STERILE WATER FOR IRRIGATION IR SOLN
Status: DC | PRN
  Administered 2021-01-06: 3000 mL

## 2021-01-06 MED ORDER — SODIUM CHLORIDE 0.9% FLUSH: 3.0000 mL | Freq: Two times a day (BID) | INTRAVENOUS | Status: DC

## 2021-01-06 MED ORDER — FENTANYL CITRATE (PF) 100 MCG/2ML IJ SOLN
INTRAMUSCULAR | Status: DC | PRN
  Administered 2021-01-06: 25 ug via INTRAVENOUS

## 2021-01-06 MED ORDER — ONDANSETRON HCL 4 MG/2ML IJ SOLN
INTRAMUSCULAR | Status: AC
  Filled 2021-01-06: qty 2

## 2021-01-06 MED ORDER — ACETAMINOPHEN 500 MG PO TABS
1000.0000 mg | ORAL_TABLET | Freq: Once | ORAL | Status: AC
  Administered 2021-01-06: 500 mg via ORAL
  Filled 2021-01-06: qty 2

## 2021-01-06 MED ORDER — ORAL CARE MOUTH RINSE: 15.0000 mL | Freq: Once | OROMUCOSAL | Status: AC

## 2021-01-06 MED ORDER — IOHEXOL 300 MG/ML  SOLN
INTRAMUSCULAR | Status: DC | PRN
  Administered 2021-01-06: 20 mL

## 2021-01-06 MED ORDER — DEXAMETHASONE SODIUM PHOSPHATE 10 MG/ML IJ SOLN
INTRAMUSCULAR | Status: AC
  Filled 2021-01-06: qty 1

## 2021-01-06 MED ORDER — PROPOFOL 500 MG/50ML IV EMUL
INTRAVENOUS | Status: DC | PRN
  Administered 2021-01-06: 150 mg via INTRAVENOUS

## 2021-01-06 MED ORDER — TRAMADOL HCL 50 MG PO TABS: 50.0000 mg | ORAL_TABLET | Freq: Four times a day (QID) | ORAL | 0 refills | Status: DC | PRN

## 2021-01-06 MED ORDER — FENTANYL CITRATE (PF) 100 MCG/2ML IJ SOLN
INTRAMUSCULAR | Status: AC
  Filled 2021-01-06: qty 2

## 2021-01-06 MED ORDER — EPHEDRINE SULFATE-NACL 50-0.9 MG/10ML-% IV SOSY
PREFILLED_SYRINGE | INTRAVENOUS | Status: DC | PRN
  Administered 2021-01-06: 10 mg via INTRAVENOUS

## 2021-01-06 MED ORDER — PROPOFOL 10 MG/ML IV BOLUS
INTRAVENOUS | Status: AC
  Filled 2021-01-06: qty 20

## 2021-01-06 MED ORDER — CELECOXIB 200 MG PO CAPS
200.0000 mg | ORAL_CAPSULE | Freq: Once | ORAL | Status: AC
  Administered 2021-01-06: 200 mg via ORAL
  Filled 2021-01-06: qty 1

## 2021-01-06 MED ORDER — LACTATED RINGERS IV SOLN: INTRAVENOUS | Status: DC

## 2021-01-06 MED ORDER — GLYCOPYRROLATE PF 0.2 MG/ML IJ SOSY
PREFILLED_SYRINGE | INTRAMUSCULAR | Status: DC | PRN
  Administered 2021-01-06: .2 mg via INTRAVENOUS

## 2021-01-06 MED ORDER — CHLORHEXIDINE GLUCONATE 0.12 % MT SOLN
15.0000 mL | Freq: Once | OROMUCOSAL | Status: AC
  Administered 2021-01-06: 15 mL via OROMUCOSAL

## 2021-01-06 MED ORDER — LIDOCAINE 2% (20 MG/ML) 5 ML SYRINGE
INTRAMUSCULAR | Status: AC
  Filled 2021-01-06: qty 5

## 2021-01-06 MED ORDER — ONDANSETRON HCL 4 MG/2ML IJ SOLN
INTRAMUSCULAR | Status: DC | PRN
  Administered 2021-01-06: 4 mg via INTRAVENOUS

## 2021-01-06 MED ORDER — HYDROMORPHONE HCL 1 MG/ML IJ SOLN
0.2500 mg | INTRAMUSCULAR | Status: DC | PRN
  Administered 2021-01-06: 0.5 mg via INTRAVENOUS
  Administered 2021-01-06 (×2): 0.25 mg via INTRAVENOUS

## 2021-01-06 MED ORDER — HYDROMORPHONE HCL 1 MG/ML IJ SOLN
INTRAMUSCULAR | Status: AC
  Filled 2021-01-06: qty 1

## 2021-01-06 SURGICAL SUPPLY — 19 items
BAG DRN RND TRDRP ANRFLXCHMBR (UROLOGICAL SUPPLIES) ×1
BAG URINE DRAIN 2000ML AR STRL (UROLOGICAL SUPPLIES) ×1 IMPLANT
BAG URO CATCHER STRL LF (MISCELLANEOUS) ×2 IMPLANT
CATH FOLEY 3WAY 30CC 22FR (CATHETERS) IMPLANT
CATH URET 5FR 28IN OPEN ENDED (CATHETERS) IMPLANT
DRAPE FOOT SWITCH (DRAPES) ×2 IMPLANT
GLOVE SURG POLYISO LF SZ8 (GLOVE) IMPLANT
GOWN STRL REUS W/TWL XL LVL3 (GOWN DISPOSABLE) ×2 IMPLANT
HOLDER FOLEY CATH W/STRAP (MISCELLANEOUS) ×1 IMPLANT
KIT TURNOVER KIT A (KITS) ×2 IMPLANT
LOOP CUT BIPOLAR 24F LRG (ELECTROSURGICAL) ×1 IMPLANT
MANIFOLD NEPTUNE II (INSTRUMENTS) ×2 IMPLANT
PACK CYSTO (CUSTOM PROCEDURE TRAY) ×2 IMPLANT
SUT ETHILON 3 0 PS 1 (SUTURE) IMPLANT
SYR 30ML LL (SYRINGE) IMPLANT
SYR TOOMEY IRRIG 70ML (MISCELLANEOUS)
SYRINGE TOOMEY IRRIG 70ML (MISCELLANEOUS) IMPLANT
TUBING CONNECTING 10 (TUBING) ×2 IMPLANT
TUBING UROLOGY SET (TUBING) ×2 IMPLANT

## 2021-01-06 NOTE — Transfer of Care (Signed)
Immediate Anesthesia Transfer of Care Note  Patient: Dillon Young  Procedure(s) Performed: Procedure(s): TRANSURETHRAL RESECTION OF BLADDER TUMOR WITH GEMCITABINE BILATERAL RETROGRADES (Bilateral)  Patient Location: PACU  Anesthesia Type:General  Level of Consciousness: Alert, Awake, Oriented  Airway & Oxygen Therapy: Patient Spontanous Breathing  Post-op Assessment: Report given to RN  Post vital signs: Reviewed and stable  Last Vitals:  Vitals:   01/06/21 0638  BP: (!) 146/50  Pulse: (!) 50  Resp: 16  Temp: 36.9 C  SpO2: 0000000    Complications: No apparent anesthesia complications

## 2021-01-06 NOTE — Op Note (Signed)
Procedure: 1.  Cystoscopy with bilateral retrograde pyelography and interpretation. 2.  Cystoscopy with biopsy and fulguration of 6 mm bladder tumors on the right dome and left lateral wall. 3.  Application of fluoroscopy. 4.  Instillation of gemcitabine in PACU.  Preop diagnosis: Recurrent bladder tumors.  Postop diagnosis: Same.  Surgeon: Dr. Irine Seal.  Anesthesia: General.  Specimen: Bladder tumors from the right dome and left lateral wall.  Drain: 73 [place] Foley catheter.  EBL: None.  Complications: None.  Indications: The patient is an 87 year old male with a history of prostate cancer with prior radiation and subsequent development of T1 high-grade urothelial carcinoma of the bladder that was treated with initial resection and BCG which she completed on 08/01/2020.  He was found on surveillance cystoscopy to have a recurrent papillary tumor on the left posterior lateral wall the bladder there is approximately 5 to 6 mm.  It was felt that tumor resection bowel retrograde pyelography was indicated.  Procedure: He was taken operating room was given 2 g of Ancef.  General anesthetic was induced.  He was placed in lithotomy position and fitted with PAS hose.  His perineum and genitalia were prepped with Betadine solution and draped in usual sterile fashion.  Cystoscopy was performed using a 21 [place] scope and 30 degree lens.  Examination revealed a normal urethra.  The external sphincter had some radiation changes but was intact.  The prostatic urethra was widely patent with some radiation changes and some mild mucosal necrosis at the bladder neck.  Inspection of bladder revealed mild to moderate trabeculation with a approximately 6 mm papillary lesion on the left posterior lateral wall and a similarly sized lesion on the right dome.  The dome lesion had not been noted at office cystoscopy.  Ureteral orifices were unremarkable.  Bilateral retrograde pyelography was performed using a 5  [place] open-ended catheter and Omnipaque.  The right retrograde pyelogram demonstrated normal ureter and intrarenal collecting system.  The left retrograde pyelogram demonstrated a normal ureter and intrarenal collecting system.  A cup biopsy forceps was then used to remove the tumor from the left posterior lateral wall and 1 bite and the right dome lesion was then removed in 2 bites.  The biopsy sites were then fulgurated to provide hemostasis.  Once hemostasis was assured inspection demonstrated no additional bladder wall lesions, active bleeding or bladder wall perforation, the cystoscope was removed and a 16 French Foley catheter was inserted.  The balloon was filled with 10 mL of sterile fluid and the cath was placed to straight drainage.  He was taken down from lithotomy position, his anesthetic was reversed and he was moved recovery in stable condition.  In the PACU his bladder was instilled with 2000 mg of gemcitabine and 50 mL of diluent.  This was left indwelling for 1 hour and then the catheter was removed after drainage.  There were no complications.

## 2021-01-06 NOTE — Interval H&P Note (Signed)
History and Physical Interval Note:  01/06/2021 7:12 AM  Dillon Young  has presented today for surgery, with the diagnosis of BLADDER TUMOR.  The various methods of treatment have been discussed with the patient and family. After consideration of risks, benefits and other options for treatment, the patient has consented to  Procedure(s): TRANSURETHRAL RESECTION OF BLADDER TUMOR WITH GEMCITABINE BILATERAL RETROGRADES (Bilateral) as a surgical intervention.  The patient's history has been reviewed, patient examined, no change in status, stable for surgery.  I have reviewed the patient's chart and labs.  Questions were answered to the patient's satisfaction.     Irine Seal

## 2021-01-06 NOTE — Discharge Instructions (Signed)
You may resume the Eliquis in 3 days if your urine is clear.

## 2021-01-06 NOTE — Anesthesia Procedure Notes (Signed)
Procedure Name: LMA Insertion Date/Time: 01/06/2021 8:43 AM Performed by: Gerald Leitz, CRNA Pre-anesthesia Checklist: Patient identified, Patient being monitored, Timeout performed, Emergency Drugs available and Suction available Patient Re-evaluated:Patient Re-evaluated prior to induction Oxygen Delivery Method: Circle system utilized Preoxygenation: Pre-oxygenation with 100% oxygen Induction Type: IV induction Ventilation: Mask ventilation without difficulty LMA: LMA inserted LMA Size: 4.0 Tube type: Oral Number of attempts: 1 Placement Confirmation: positive ETCO2 and breath sounds checked- equal and bilateral Tube secured with: Tape Dental Injury: Teeth and Oropharynx as per pre-operative assessment

## 2021-01-06 NOTE — Anesthesia Postprocedure Evaluation (Signed)
Anesthesia Post Note  Patient: [name]  Procedure(s) Performed: TRANSURETHRAL RESECTION OF BLADDER TUMOR WITH GEMCITABINE BILATERAL RETROGRADES (Bilateral: Bladder)     Patient location during evaluation: PACU Anesthesia Type: General Level of consciousness: sedated Pain management: pain level controlled Vital Signs Assessment: post-procedure vital signs reviewed and stable Respiratory status: spontaneous breathing and respiratory function stable Cardiovascular status: stable Postop Assessment: no apparent nausea or vomiting Anesthetic complications: no   No notable events documented.  Last Vitals:  Vitals:   01/06/21 1015 01/06/21 1030  BP: (!) 164/71   Pulse: 64 68  Resp: 11 15  Temp:    SpO2: 97% 99%    Last Pain:  Vitals:   01/06/21 1028  TempSrc:   PainSc: 8                  Ejay Lashley DANIEL

## 2021-01-07 ENCOUNTER — Encounter (HOSPITAL_COMMUNITY): Payer: Self-pay | Admitting: Urology

## 2021-01-09 LAB — SURGICAL PATHOLOGY

## 2021-01-13 DIAGNOSIS — C671 Malignant neoplasm of dome of bladder: Secondary | ICD-10-CM | POA: Diagnosis not present

## 2021-01-13 DIAGNOSIS — Z8546 Personal history of malignant neoplasm of prostate: Secondary | ICD-10-CM | POA: Diagnosis not present

## 2021-01-13 DIAGNOSIS — C674 Malignant neoplasm of posterior wall of bladder: Secondary | ICD-10-CM | POA: Diagnosis not present

## 2021-01-24 DIAGNOSIS — C678 Malignant neoplasm of overlapping sites of bladder: Secondary | ICD-10-CM | POA: Diagnosis not present

## 2021-01-24 DIAGNOSIS — Z5111 Encounter for antineoplastic chemotherapy: Secondary | ICD-10-CM | POA: Diagnosis not present

## 2021-01-26 ENCOUNTER — Other Ambulatory Visit: Payer: Self-pay | Admitting: Cardiovascular Disease

## 2021-01-31 DIAGNOSIS — C678 Malignant neoplasm of overlapping sites of bladder: Secondary | ICD-10-CM | POA: Diagnosis not present

## 2021-01-31 DIAGNOSIS — Z5111 Encounter for antineoplastic chemotherapy: Secondary | ICD-10-CM | POA: Diagnosis not present

## 2021-02-08 DIAGNOSIS — Z5111 Encounter for antineoplastic chemotherapy: Secondary | ICD-10-CM | POA: Diagnosis not present

## 2021-02-08 DIAGNOSIS — C678 Malignant neoplasm of overlapping sites of bladder: Secondary | ICD-10-CM | POA: Diagnosis not present

## 2021-02-09 ENCOUNTER — Encounter: Payer: Self-pay | Admitting: Internal Medicine

## 2021-02-10 MED ORDER — ROSUVASTATIN CALCIUM 10 MG PO TABS: 10.0000 mg | ORAL_TABLET | Freq: Every day | ORAL | 0 refills | Status: DC

## 2021-02-15 ENCOUNTER — Telehealth: Payer: Self-pay | Admitting: Cardiovascular Disease

## 2021-02-15 DIAGNOSIS — C678 Malignant neoplasm of overlapping sites of bladder: Secondary | ICD-10-CM | POA: Diagnosis not present

## 2021-02-15 DIAGNOSIS — I4819 Other persistent atrial fibrillation: Secondary | ICD-10-CM

## 2021-02-15 DIAGNOSIS — I10 Essential (primary) hypertension: Secondary | ICD-10-CM

## 2021-02-15 DIAGNOSIS — Z5111 Encounter for antineoplastic chemotherapy: Secondary | ICD-10-CM | POA: Diagnosis not present

## 2021-02-15 NOTE — Telephone Encounter (Signed)
He should be on Eliquis 5mg  BID.  I do see where CHF clinic note 04/19/20 stated: "- Continue Eliquis. His dose was recently reduced down to 2.5 mg bid given hematuria, however he is at a risk for CVA on reduced dose. Based on dosing criteria using age, wt and renal function, he should be on full dose 5 mg bid. Will check CBC today to ensure H/H stable. If so, would recommend he go back to full dose and keep f/u w/ urology this week."  Follow up labs: "Message from Consuelo Pandy, [place] sent at 04/19/2020  1:57 PM EST ----- Hgb ok. Normal at 14. He should go back on Eliquis 5 mg bid."

## 2021-02-15 NOTE — Telephone Encounter (Signed)
This was sent Via my chart message to sching pool   Pt c/o medication issue:  1. Name of Medication: Eliquis   2. How are you currently taking this medication (dosage and times per day)? That is in question  3. Are you having a reaction (difficulty breathing--STAT)? Na   4. What is your medication issue?  I need to ask about the dosage on the medication Eliquis. I'm looking at some notes I made back in November 2021 to cut back from 5mg 's to 2.5. I need to know which is correct. Thanks, WT

## 2021-02-15 NOTE — Telephone Encounter (Signed)
Called and spoke to pt and stated that they should be on the 5mg  and scheduled the labs as requested

## 2021-02-16 ENCOUNTER — Other Ambulatory Visit: Payer: Self-pay

## 2021-02-16 ENCOUNTER — Other Ambulatory Visit: Payer: HMO | Admitting: *Deleted

## 2021-02-16 DIAGNOSIS — I10 Essential (primary) hypertension: Secondary | ICD-10-CM | POA: Diagnosis not present

## 2021-02-16 DIAGNOSIS — I4819 Other persistent atrial fibrillation: Secondary | ICD-10-CM

## 2021-02-16 LAB — CBC
Hematocrit: 40.3 % (ref 37.5–51.0)
Hemoglobin: 12.7 g/dL — ABNORMAL LOW (ref 13.0–17.7)
MCH: 26.1 pg — ABNORMAL LOW (ref 26.6–33.0)
MCHC: 31.5 g/dL (ref 31.5–35.7)
MCV: 83 fL (ref 79–97)
Platelets: 136 10*3/uL — ABNORMAL LOW (ref 150–450)
RBC: 4.86 x10E6/uL (ref 4.14–5.80)
RDW: 14.2 % (ref 11.6–15.4)
WBC: 4.5 10*3/uL (ref 3.4–10.8)

## 2021-02-16 LAB — BASIC METABOLIC PANEL
BUN/Creatinine Ratio: 19 (ref 10–24)
BUN: 20 mg/dL (ref 8–27)
CO2: 25 mmol/L (ref 20–29)
Calcium: 9.3 mg/dL (ref 8.6–10.2)
Chloride: 103 mmol/L (ref 96–106)
Creatinine, Ser: 1.06 mg/dL (ref 0.76–1.27)
Glucose: 107 mg/dL — ABNORMAL HIGH (ref 65–99)
Potassium: 4.4 mmol/L (ref 3.5–5.2)
Sodium: 143 mmol/L (ref 134–144)
eGFR: 68 mL/min/{1.73_m2} (ref >59)

## 2021-02-21 ENCOUNTER — Ambulatory Visit: Payer: HMO | Admitting: Internal Medicine

## 2021-02-21 ENCOUNTER — Telehealth: Payer: Self-pay

## 2021-02-21 ENCOUNTER — Encounter: Payer: Self-pay | Admitting: Internal Medicine

## 2021-02-21 VITALS — BP 110/60 | HR 59 | Ht 69.0 in | Wt 175.0 lb

## 2021-02-21 DIAGNOSIS — K5909 Other constipation: Secondary | ICD-10-CM | POA: Diagnosis not present

## 2021-02-21 DIAGNOSIS — K648 Other hemorrhoids: Secondary | ICD-10-CM

## 2021-02-21 DIAGNOSIS — Z7901 Long term (current) use of anticoagulants: Secondary | ICD-10-CM | POA: Diagnosis not present

## 2021-02-21 DIAGNOSIS — R1319 Other dysphagia: Secondary | ICD-10-CM | POA: Diagnosis not present

## 2021-02-21 DIAGNOSIS — K627 Radiation proctitis: Secondary | ICD-10-CM | POA: Diagnosis not present

## 2021-02-21 MED ORDER — HYDROCORTISONE (PERIANAL) 2.5 % EX CREA: 1.0000 "application " | TOPICAL_CREAM | Freq: Three times a day (TID) | CUTANEOUS | 1 refills | Status: AC | PRN

## 2021-02-21 NOTE — Telephone Encounter (Signed)
Bill informed and verbalized understanding to hold the Eliquis starting tomorrow.

## 2021-02-21 NOTE — Telephone Encounter (Signed)
[facility] Pre-operative Risk Assessment     Request for surgical clearance:     Endoscopy Procedure  What type of surgery is being performed?     EGD  When is this surgery scheduled?     02/23/21  What type of clearance is required ?   Pharmacy  Are there any medications that need to be held prior to surgery and how long? Eliquis, one day  Practice name and name of physician performing surgery?      [facility]  What is your office phone and fax number?      Phone- [phone]  [phone]  Anesthesia type (None, local, MAC, general) ?       MAC

## 2021-02-21 NOTE — Progress Notes (Signed)
Dillon Young 87 y.o. [birth year] [id number]  Assessment & Plan:   Encounter Diagnoses  Name Primary?   Esophageal dysphagia Yes   Internal hemorrhoids with complication    Radiation proctitis - vascular ectasia    Long term current use of anticoagulant- Eliquis    Constipation, chronic    Evaluate dysphagia with EGD and possible balloon dilation. The risks and benefits as well as alternatives of endoscopic procedure(s) have been discussed and reviewed. All questions answered. The patient agrees to proceed.  The rare but real risk of stroke off of his anticoagulant Eliquis explained to the patient.  We will confer with cardiology, my recommendation is we hold the Eliquis the day before and the day of his EGD.  Continue current care for hemorrhoids try not to overuse hydrocortisone cream he can try some Preparation H continue fiber supplementation.  I appreciate the opportunity to care for this patient. CC: [name], MD  Subjective:   Chief Complaint: Dysphagia and hemorrhoids and rectal bleeding  HPI Dillon Young is here with 2 issues.  1 is new onset of esophageal dysphagia intermittent midsternal sticking point with food and pills.  It is episodic and not progressive.  Has been there for several months.  He denies any heartburn.  Remote history of EGD and 54 French Maloney dilation 2004, Dr. Velora Heckler. Wt Readings from Last 3 Encounters:  02/21/21 175 lb (79.4 kg)  01/06/21 173 lb 15.1 oz (78.9 kg)  12/28/20 174 lb (78.9 kg)   He rarely see some bleeding with constipation he says sometimes his anus and rectum is "tight" with a bowel movement.  He will use hydrocortisone cream intermittently.  Wants his hemorrhoids checked again.  Most of the time there were no bowel movement issues, he is on a fiber supplement chronically.  He does have a history of chronic constipation.  Review of systems notable for recent diagnosis and treatment of bladder cancer, Dr. [name].  Getting  gemcitabine instillation into the bladder.  He remains on Eliquis for history of A. fib followed by Dr. Alvester Chou, most recent ejection fraction 50 to 55% 05/16/2020.  He walks 30 minutes almost every day at his church. Allergies  Allergen Reactions   Lipitor [Atorvastatin] Other (See Comments)    Muscle pain   Other Rash and Other (See Comments)    Detergent- Rashes on the skin   Current Meds  Medication Sig   acetaminophen (TYLENOL) 500 MG tablet Take 500 mg by mouth every 6 (six) hours as needed for mild pain or headache.   apixaban (ELIQUIS) 5 MG TABS tablet Take 1 tablet (5 mg total) by mouth 2 (two) times daily.   carvedilol (COREG) 3.125 MG tablet Take 3.125 mg by mouth 2 (two) times daily.   carvedilol (COREG) 6.25 MG tablet TAKE 1 TABLET BY MOUTH 2 TIMES DAILY WITH A MEAL. (Patient taking differently: Take 9.375 mg by mouth in the morning and at bedtime.)   Cholecalciferol (VITAMIN D-3) 25 MCG (1000 UT) CAPS Take 1,000 Units by mouth daily.   dofetilide (TIKOSYN) 125 MCG capsule TAKE 1 CAPSULE BY MOUTH TWICE DAILY (Patient taking differently: Take 125 mcg by mouth 2 (two) times daily.)   fexofenadine (ALLEGRA) 180 MG tablet Take 180 mg by mouth daily as needed for allergies or rhinitis.    fluocinolone (VANOS) 0.01 % cream Apply topically 2 (two) times daily. (Patient taking differently: Apply 1 application topically 2 (two) times daily as needed (rash).)   fluticasone (FLONASE) 50 MCG/ACT  nasal spray Place 2 sprays into both nostrils daily as needed for allergies or rhinitis.   hydrocortisone (ANUSOL-HC) 2.5 % rectal cream Place 1 application rectally 3 (three) times daily. (Patient taking differently: Place 1 application rectally 3 (three) times daily as needed for hemorrhoids.)   Hypromellose (ARTIFICIAL TEARS OP) Place 1 drop into both eyes 4 (four) times daily as needed (dry eyes).   ipratropium (ATROVENT) 0.06 % nasal spray PLACE 2 SPRAYS INTO BOTH NOSTRILS 4 (FOUR) TIMES DAILY AS  NEEDED FOR RHINITIS.   levocetirizine (XYZAL) 5 MG tablet Take 1 tablet (5 mg total) by mouth every evening. (Patient taking differently: Take 5 mg by mouth at bedtime as needed for allergies.)   nitroGLYCERIN (NITROSTAT) 0.4 MG SL tablet Place 1 tablet (0.4 mg total) under the tongue every 5 (five) minutes as needed for chest pain.   rosuvastatin (CRESTOR) 10 MG tablet Take 1 tablet (10 mg total) by mouth daily. Keep scheduled appt for future refills   sacubitril-valsartan (ENTRESTO) 97-103 MG Take 1 tablet by mouth 2 (two) times daily.   sodium chloride (OCEAN) 0.65 % SOLN nasal spray Place 1-2 sprays into both nostrils as needed for congestion.   spironolactone (ALDACTONE) 25 MG tablet Take 2 tablets (50 mg total) by mouth daily.   traMADol (ULTRAM) 50 MG tablet Take 1 tablet (50 mg total) by mouth every 6 (six) hours as needed.   triamcinolone (NASACORT) 55 MCG/ACT AERO nasal inhaler Place 2 sprays into the nose at bedtime.   triamcinolone cream (KENALOG) 0.1 % Apply 1 application topically daily as needed (to affected areas, for itching).    Wheat Dextrin (BENEFIBER DRINK MIX PO) Take 2 Scoops by mouth daily in the afternoon. Take 2 tablespoons daily   Past Medical History:  Diagnosis Date   Adenomatous colon polyp    Allergic rhinitis    Anemia    Anxiety    Bladder cancer (HCC)    BPH (benign prostatic hyperplasia)    CAD (coronary artery disease)    sees Dr. [name]    Carotid artery disease [facility])    CHF (congestive heart failure) ([place])    Chronic kidney disease    Sones. Prostate cancer   Clotting disorder (HCC)    CVA (cerebral infarction)    Diabetes mellitus    "Borderline"   Diverticulitis    Dysrhythmia    atrial fib - hx of    Edema    recent swelling in feet and ankles    GERD (gastroesophageal reflux disease)    history   Headache    Hemorrhoids    History of kidney stones    HTN (hypertension)    Hx of radiation therapy    for prostate - 2013     Hyperlipidemia LDL goal <70    IBS (irritable bowel syndrome)    Low back pain    Pre-diabetes    Prostate cancer ([place]) 2008   seed implantation   Sleep apnea    sitting in recliner helps him to breathe per pt , never used cpap sleep study - pt reports could not go to sleep- 6 years ago    Smoker    Stroke [facility])    hx of mini strokes years ago    Past Surgical History:  Procedure Laterality Date   APPENDECTOMY     CARDIAC CATHETERIZATION N/A 01/16/2016   Procedure: Left Heart Cath and Coronary Angiography;  Surgeon: Jettie Booze, MD;  Location: [place] CV LAB;  Service: Cardiovascular;  Laterality: N/A;   CARDIAC CATHETERIZATION N/A 01/16/2016   Procedure: Coronary Stent Intervention;  Surgeon: Jettie Booze, MD;  Location: [place] [facility];  Service: Cardiovascular;  Laterality: N/A;   CARDIOVERSION N/A 11/26/2017   Procedure: CARDIOVERSION;  Surgeon: [name], MD;  Location[facility] ENDOSCOPY;  Service: Cardiovascular;  Laterality: N/A;   CARDIOVERSION N/A 03/13/2018   Procedure: CARDIOVERSION;  Surgeon: [name], MD;  Location: [facility] ENDOSCOPY;  Service: Cardiovascular;  Laterality: N/A;   CATARACT EXTRACTION W/ INTRAOCULAR LENS  IMPLANT, BILATERAL Bilateral    COLONOSCOPY  10/21/2008   diverticulosis, radiation proctitis, internal hemorrhoids   CORONARY ANGIOPLASTY     CORONARY STENT INTERVENTION N/A 12/26/2017   Procedure: CORONARY STENT INTERVENTION;  Surgeon: [name], MD;  Location: [place] [facility] LAB;  Service: Cardiovascular;  Laterality: N/A;   ENDARTERECTOMY  11/12/2011   Procedure: ENDARTERECTOMY CAROTID;  Surgeon: Rosetta Posner, MD;  Location: [place];  Service: Vascular;  Laterality: Right;   ESOPHAGOGASTRODUODENOSCOPY (EGD) WITH ESOPHAGEAL DILATION     HEMORRHOID BANDING     LEFT HEART CATH AND CORONARY ANGIOGRAPHY N/A 12/26/2017   Procedure: LEFT HEART CATH AND CORONARY ANGIOGRAPHY;  Surgeon: [name], MD;  Location: [place] CV  LAB;  Service: Cardiovascular;  Laterality: N/A;   LITHOTRIPSY  X 3   LUMBAR LAMINECTOMY     TRANSURETHRAL RESECTION OF BLADDER TUMOR WITH MITOMYCIN-C Bilateral 01/06/2021   Procedure: TRANSURETHRAL RESECTION OF BLADDER TUMOR WITH GEMCITABINE BILATERAL RETROGRADES;  Surgeon: Irine Seal, MD;  Location: [facility];  Service: Urology;  Laterality: Bilateral;   TRANSURETHRAL RESECTION OF PROSTATE  10/2003   TRANSURETHRAL RESECTION OF PROSTATE N/A 05/31/2020   Procedure: CYSTOSCOPY TRANSURETHRAL RESECTION BIOPSY AND FULGURATION OF PROSTATE LESION  (TURP);  Surgeon: Irine Seal, MD;  Location: [facility];  Service: Urology;  Laterality: N/A;   Social History   Social History Narrative   Married second time   Retired [employer] yrs   2 daughters and 2 stepdaughters   family history includes Asthma in his paternal grandmother; Heart disease in his father; Hypertension in his mother.   Review of Systems As above  Objective:   Physical Exam BP 110/60   Pulse (!) 59   Ht 5\' 9"  (1.753 m)   Wt 175 lb (79.4 kg)   BMI 25.84 kg/m  Lungs are clear Heart sounds are normal S1-S2 no rubs murmurs or gallops The abdomen is soft with a diastases recti, bowel sounds present there is no organomegaly mass or tenderness and no hernia detected Rectal exam mild anal spasm/stenosis there is formed brown stool in the vault.  Nontender.  No mass.  Anoscopy is performed and shows grade 1 internal and external hemorrhoids, and they are mildly inflamed particularly the external component and there is vascular ectasia in the right anterior area consistent with radiation vascular ectasia status post radioactive seed implants.

## 2021-02-21 NOTE — Patient Instructions (Signed)
If you are age 85 or older, your body mass index should be between 23-30. Your Body mass index is 25.84 kg/m. If this is out of the aforementioned range listed, please consider follow up with your Primary Care Provider.  If you are age 55 or younger, your body mass index should be between 19-25. Your Body mass index is 25.84 kg/m. If this is out of the aformentioned range listed, please consider follow up with your Primary Care Provider.   __________________________________________________________  The  GI providers would like to encourage you to use [facility] to communicate with providers for non-urgent requests or questions.  Due to long hold times on the telephone, sending your provider a message by [facility] may be a faster and more efficient way to get a response.  Please allow 48 business hours for a response.  Please remember that this is for non-urgent requests.   You have been scheduled for an endoscopy. Please follow written instructions given to you at your visit today. If you use inhalers (even only as needed), please bring them with you on the day of your procedure.  You will be contacted by our office prior to your procedure for directions on holding your Eliquis. Call me PJ is you don't hear from me later on today or early in the morning.   Try using some over the counter Prep H along with your hydrocortisone cream for your hemorrhoids.  I appreciate the opportunity to care for you. Silvano Rusk, MD, [facility]

## 2021-02-21 NOTE — Telephone Encounter (Signed)
Patient with diagnosis of afib on Eliquis for anticoagulation.    Procedure: EGD Date of procedure: 02/23/21  CHA2DS2-VASc Score = 8  This indicates a 10.8% annual risk of stroke. The patient's score is based upon: CHF History: 1 HTN History: 1 Diabetes History: 1 Stroke History: 2 Vascular Disease History: 1 Age Score: 2 Gender Score: 0 Stroke noted in [place] from GI notes, unclear why this hasn't pulled over to [place] in cards notes.  CrCl 4mL/min Platelet count 136K  Per office protocol, patient can hold Eliquis for 1 day prior to procedure as requested.  Resume as soon as safely possible after.

## 2021-02-21 NOTE — Telephone Encounter (Signed)
    Patient Name: Dillon Young  DOB: 1933/01/17 MRN: [id number]  Primary Cardiologist: [name], MD  Chart reviewed as part of pre-operative protocol coverage. Given past medical history and time since last visit, based on ACC/AHA guidelines, [name] would be at acceptable risk for the planned procedure without further cardiovascular testing.   Patient with diagnosis of afib on Eliquis for anticoagulation.     Procedure: EGD Date of procedure: 02/23/21   CHA2DS2-VASc Score = 8  This indicates a 10.8% annual risk of stroke. The patient's score is based upon: CHF History: 1 HTN History: 1 Diabetes History: 1 Stroke History: 2 Vascular Disease History: 1 Age Score: 2 Gender Score: 0 Stroke noted in [place] from GI notes, unclear why this hasn't pulled over to [place] in cards notes.   CrCl 8mL/min Platelet count 136K   Per office protocol, patient can hold Eliquis for 1 day prior to procedure as requested.  Resume as soon as safely possible after. The patient has been informed.   I will route this recommendation to the requesting party via Epic fax function and remove from pre-op pool.  Please call with questions.  Kathyrn Drown, NP 02/21/2021, 3:09 PM

## 2021-02-22 DIAGNOSIS — C678 Malignant neoplasm of overlapping sites of bladder: Secondary | ICD-10-CM | POA: Diagnosis not present

## 2021-02-22 DIAGNOSIS — Z5111 Encounter for antineoplastic chemotherapy: Secondary | ICD-10-CM | POA: Diagnosis not present

## 2021-02-23 ENCOUNTER — Other Ambulatory Visit: Payer: Self-pay

## 2021-02-23 ENCOUNTER — Encounter: Payer: Self-pay | Admitting: Internal Medicine

## 2021-02-23 ENCOUNTER — Ambulatory Visit (AMBULATORY_SURGERY_CENTER): Payer: HMO | Admitting: Internal Medicine

## 2021-02-23 VITALS — BP 147/62 | HR 62 | Temp 97.8°F | Resp 13 | Ht 69.0 in | Wt 175.0 lb

## 2021-02-23 DIAGNOSIS — R1310 Dysphagia, unspecified: Secondary | ICD-10-CM

## 2021-02-23 DIAGNOSIS — K3189 Other diseases of stomach and duodenum: Secondary | ICD-10-CM | POA: Diagnosis not present

## 2021-02-23 DIAGNOSIS — K2990 Gastroduodenitis, unspecified, without bleeding: Secondary | ICD-10-CM

## 2021-02-23 DIAGNOSIS — K2970 Gastritis, unspecified, without bleeding: Secondary | ICD-10-CM

## 2021-02-23 DIAGNOSIS — K317 Polyp of stomach and duodenum: Secondary | ICD-10-CM | POA: Diagnosis not present

## 2021-02-23 DIAGNOSIS — K319 Disease of stomach and duodenum, unspecified: Secondary | ICD-10-CM | POA: Diagnosis not present

## 2021-02-23 DIAGNOSIS — K2950 Unspecified chronic gastritis without bleeding: Secondary | ICD-10-CM | POA: Diagnosis not present

## 2021-02-23 MED ORDER — SODIUM CHLORIDE 0.9 % IV SOLN: 500.0000 mL | INTRAVENOUS | Status: DC

## 2021-02-23 NOTE — Progress Notes (Signed)
Pt's states no medical or surgical changes since previsit or office visit. 

## 2021-02-23 NOTE — Progress Notes (Signed)
Vss nad transferred to pacu 

## 2021-02-23 NOTE — Patient Instructions (Addendum)
I stretched the esophagus - let us see if that helps. Did not see a blockage. Sometimes as we age the esophagus does not function as well. Be sure to cut solids small and chew well, eat slowly and drink fluids with solid foods.  I saw two areas to biopsy in the stomach - looks like inflammation. Want to understand what it is and will let you know.  There is a tiny nodule in the wall of the stomach that I am not concerned about - we see those at times and based upon shape and size I would not do anything further.  I appreciate the opportunity to care for you. Gatha Mayer, MD, FACG   YOU HAD AN ENDOSCOPIC PROCEDURE TODAY AT [facility]:   Refer to the procedure report that was given to you for any specific questions about what was found during the examination.  If the procedure report does not answer your questions, please call your gastroenterologist to clarify.  If you requested that your care partner not be given the details of your procedure findings, then the procedure report has been included in a sealed envelope for you to review at your convenience later.  YOU SHOULD EXPECT: Some feelings of bloating in the abdomen. Passage of more gas than usual.  Walking can help get rid of the air that was put into your GI tract during the procedure and reduce the bloating. If you had a lower endoscopy (such as a colonoscopy or flexible sigmoidoscopy) you may notice spotting of blood in your stool or on the toilet paper. If you underwent a bowel prep for your procedure, you may not have a normal bowel movement for a few days.  Please Note:  You might notice some irritation and congestion in your nose or some drainage.  This is from the oxygen used during your procedure.  There is no need for concern and it should clear up in a day or so.  SYMPTOMS TO REPORT IMMEDIATELY:  Following upper endoscopy (EGD)  Vomiting of blood or coffee ground material  New chest pain or pain under the  shoulder blades  Painful or persistently difficult swallowing  New shortness of breath  Fever of 100F or higher  Black, tarry-looking stools  For urgent or emergent issues, a gastroenterologist can be reached at any hour by calling [phone]. Do not use MyChart messaging for urgent concerns.    DIET:  We do recommend a small meal at first, but then you may proceed to your regular diet.  Drink plenty of fluids but you should avoid alcoholic beverages for 24 hours.  ACTIVITY:  You should plan to take it easy for the rest of today and you should NOT DRIVE or use heavy machinery until tomorrow (because of the sedation medicines used during the test).    FOLLOW UP: Our staff will call the number listed on your records 48-72 hours following your procedure to check on you and address any questions or concerns that you may have regarding the information given to you following your procedure. If we do not reach you, we will leave a message.  We will attempt to reach you two times.  During this call, we will ask if you have developed any symptoms of COVID 19. If you develop any symptoms (ie: fever, flu-like symptoms, shortness of breath, cough etc.) before then, please call [phone].  If you test positive for Covid 19 in the 2 weeks post procedure, please call  and report this information to [place].    If any biopsies were taken you will be contacted by phone or by letter within the next 1-3 weeks.  Please call us at [phone] if you have not heard about the biopsies in 3 weeks.    SIGNATURES/CONFIDENTIALITY: You and/or your care partner have signed paperwork which will be entered into your electronic medical record.  These signatures attest to the fact that that the information above on your After Visit Summary has been reviewed and is understood.  Full responsibility of the confidentiality of this discharge information lies with you and/or your care-partner.

## 2021-02-23 NOTE — Progress Notes (Signed)
History and Physical Interval Note:  02/23/2021 8:20 AM  Dillon Young  has presented today for endoscopic procedure(s), with the diagnosis of  Encounter Diagnosis  Name Primary?   Dysphagia, unspecified type Yes  .  The various methods of evaluation and treatment have been discussed with the patient and/or family. After consideration of risks, benefits and other options for treatment, the patient has consented to  the endoscopic procedure(s).   The patient's history has been reviewed, patient examined, no change in status, stable for endoscopic procedure(s).  I have reviewed the patient's chart and labs.  Questions were answered to the patient's satisfaction.     Gatha Mayer, MD, [facility]

## 2021-02-23 NOTE — Op Note (Signed)
[facility] Patient Name: Dillon Young Procedure Date: 02/23/2021 7:31 AM MRN: [id number] Endoscopist: Gatha Mayer , MD Age: 87 Referring MD:  Date of Birth: 1932/06/08 Gender: Male Account #: [id number] Procedure:                Upper GI endoscopy Indications:              Dysphagia Medicines:                Propofol per Anesthesia, Monitored Anesthesia Care Procedure:                Pre-Anesthesia Assessment:                           - Prior to the procedure, a History and Physical                            was performed, and patient medications and                            allergies were reviewed. The patient's tolerance of                            previous anesthesia was also reviewed. The risks                            and benefits of the procedure and the sedation                            options and risks were discussed with the patient.                            All questions were answered, and informed consent                            was obtained. Prior Anticoagulants: The patient                            last took Eliquis (apixaban) 2 days prior to the                            procedure. ASA Grade Assessment: III - A patient                            with severe systemic disease. After reviewing the                            risks and benefits, the patient was deemed in                            satisfactory condition to undergo the procedure.                           After obtaining informed consent, the endoscope was  passed under direct vision. Throughout the                            procedure, the patient's blood pressure, pulse, and                            oxygen saturations were monitored continuously. The                            GIF [id number] #[id number] was introduced through the                            mouth, and advanced to the second part of duodenum.                            The upper GI  endoscopy was accomplished without                            difficulty. The patient tolerated the procedure                            well. Scope In: Scope Out: Findings:                 The examined esophagus was mildly tortuous. The                            scope was withdrawn. Dilation was performed with a                            Maloney dilator with no resistance at 75 Fr. The                            dilation site was examined following endoscope                            reinsertion and showed no change. Estimated blood                            loss: none.                           Patchy moderate inflammation characterized by                            erythema and granularity was found in the gastric                            antrum. Biopsies were taken with a cold forceps for                            histology. Verification of patient identification                            for the specimen was done.  Estimated blood loss was                            minimal.                           A single diminutive papule (nodule) with eroded                            surface was found in the cardia. Biopsies were                            taken with a cold forceps for histology.                            Verification of patient identification for the                            specimen was done. Estimated blood loss was minimal.                           A single small submucosal papule (nodule) was found                            on the greater curvature of the gastric body.                           The cardia and gastric fundus were normal on                            retroflexion.                           The examined duodenum was normal. Complications:            No immediate complications. Estimated Blood Loss:     Estimated blood loss was minimal. Impression:               - Tortuous esophagus. Dilated 54 Fr Maloney                           - Gastritis.  Biopsied.                           - A single papule (nodule) found in the stomach.                            Biopsied.                           - A single submucosal papule (nodule) found in the                            stomach. Small and innocent-appearing.                           - Normal  examined duodenum. Recommendation:           - Patient has a contact number available for                            emergencies. The signs and symptoms of potential                            delayed complications were discussed with the                            patient. Return to normal activities tomorrow.                            Written discharge instructions were provided to the                            patient.                           - Resume previous diet.                           - Continue present medications.                           - Await pathology results.                           - Resume Eliquis (apixaban) at prior dose tomorrow.                           - Cut food small, chew well and eat slowly -                            dysphagia may be from dysmotility Gatha Mayer, MD 02/23/2021 8:47:33 AM This report has been signed electronically.

## 2021-02-27 ENCOUNTER — Telehealth: Payer: Self-pay

## 2021-02-27 ENCOUNTER — Other Ambulatory Visit (HOSPITAL_COMMUNITY): Payer: Self-pay | Admitting: Cardiology

## 2021-02-27 NOTE — Telephone Encounter (Signed)
  Follow up Call-  Call back number 02/23/2021  Post procedure Call Back phone  # [phone]  Some recent data might be hidden     Patient questions:  Do you have a fever, pain , or abdominal swelling? No. Pain Score  0 *  Have you tolerated food without any problems? Yes.    Have you been able to return to your normal activities? Yes.    Do you have any questions about your discharge instructions: Diet   No. Medications  No. Follow up visit  No.  Do you have questions or concerns about your Care? No.  Actions: * If pain score is 4 or above: No action needed, pain <4.  Have you developed a fever since your procedure? no  2.   Have you had an respiratory symptoms (SOB or cough) since your procedure? no  3.   Have you tested positive for COVID 19 since your procedure no  4.   Have you had any family members/close contacts diagnosed with the COVID 19 since your procedure?  no   If yes to any of these questions please route to Joylene John, RN and Joella Prince, RN

## 2021-02-28 DIAGNOSIS — Z5111 Encounter for antineoplastic chemotherapy: Secondary | ICD-10-CM | POA: Diagnosis not present

## 2021-02-28 DIAGNOSIS — C678 Malignant neoplasm of overlapping sites of bladder: Secondary | ICD-10-CM | POA: Diagnosis not present

## 2021-03-03 ENCOUNTER — Other Ambulatory Visit: Payer: Self-pay | Admitting: Internal Medicine

## 2021-03-03 ENCOUNTER — Encounter: Payer: Self-pay | Admitting: Internal Medicine

## 2021-03-03 NOTE — Telephone Encounter (Signed)
I have contacted the pt. He stated that he has been having a "cold" for 3 days and was not sure if he should still attend his 63mo f/u OV he has scheduled on Tues 10/11. He stated that he has not had a COVID test. I recommended that he schedule a test at his local pharmacy or purchase one. He has been informed in the test is negative then he is fine to proceed with his upcoming OV. However, I did also mention that if the COVID test shows positive then we would have to reschedule his OV for another day. He expressed understanding.

## 2021-03-05 DIAGNOSIS — J302 Other seasonal allergic rhinitis: Secondary | ICD-10-CM | POA: Diagnosis not present

## 2021-03-05 DIAGNOSIS — Z20822 Contact with and (suspected) exposure to covid-19: Secondary | ICD-10-CM | POA: Diagnosis not present

## 2021-03-05 DIAGNOSIS — Z03818 Encounter for observation for suspected exposure to other biological agents ruled out: Secondary | ICD-10-CM | POA: Diagnosis not present

## 2021-03-07 ENCOUNTER — Other Ambulatory Visit: Payer: Self-pay

## 2021-03-07 ENCOUNTER — Ambulatory Visit (INDEPENDENT_AMBULATORY_CARE_PROVIDER_SITE_OTHER): Payer: HMO

## 2021-03-07 ENCOUNTER — Ambulatory Visit (INDEPENDENT_AMBULATORY_CARE_PROVIDER_SITE_OTHER): Payer: HMO | Admitting: Internal Medicine

## 2021-03-07 ENCOUNTER — Encounter: Payer: Self-pay | Admitting: Internal Medicine

## 2021-03-07 VITALS — BP 144/66 | HR 66 | Temp 97.9°F | Resp 16 | Ht 69.0 in | Wt 176.0 lb

## 2021-03-07 DIAGNOSIS — Z23 Encounter for immunization: Secondary | ICD-10-CM

## 2021-03-07 DIAGNOSIS — I10 Essential (primary) hypertension: Secondary | ICD-10-CM | POA: Diagnosis not present

## 2021-03-07 DIAGNOSIS — R0602 Shortness of breath: Secondary | ICD-10-CM | POA: Diagnosis not present

## 2021-03-07 DIAGNOSIS — Z0001 Encounter for general adult medical examination with abnormal findings: Secondary | ICD-10-CM

## 2021-03-07 DIAGNOSIS — R058 Other specified cough: Secondary | ICD-10-CM

## 2021-03-07 DIAGNOSIS — D508 Other iron deficiency anemias: Secondary | ICD-10-CM

## 2021-03-07 DIAGNOSIS — D510 Vitamin B12 deficiency anemia due to intrinsic factor deficiency: Secondary | ICD-10-CM | POA: Diagnosis not present

## 2021-03-07 DIAGNOSIS — D696 Thrombocytopenia, unspecified: Secondary | ICD-10-CM | POA: Insufficient documentation

## 2021-03-07 DIAGNOSIS — R059 Cough, unspecified: Secondary | ICD-10-CM | POA: Diagnosis not present

## 2021-03-07 LAB — IBC + FERRITIN
Ferritin: 11.6 ng/mL — ABNORMAL LOW (ref 22.0–322.0)
Iron: 41 ug/dL — ABNORMAL LOW (ref 42–165)
Saturation Ratios: 10.2 % — ABNORMAL LOW (ref 20.0–50.0)
TIBC: 400.4 ug/dL (ref 250.0–450.0)
Transferrin: 286 mg/dL (ref 212.0–360.0)

## 2021-03-07 LAB — FOLATE: Folate: >23.4 ng/mL (ref >5.9)

## 2021-03-07 LAB — TSH: TSH: 2.28 u[IU]/mL (ref 0.35–5.50)

## 2021-03-07 LAB — VITAMIN B12: Vitamin B-12: 65 pg/mL — ABNORMAL LOW (ref 211–911)

## 2021-03-07 NOTE — Progress Notes (Signed)
Subjective:  Patient ID: Dillon Young, male    DOB: Oct 03, 1932  Age: 87 y.o. MRN: [id number]  CC: Annual Exam, Atrial Fibrillation, Cough, and Diabetes  This visit occurred during the SARS-CoV-2 public health emergency.  Safety protocols were in place, including screening questions prior to the visit, additional usage of staff PPE, and extensive cleaning of exam room while observing appropriate contact time as indicated for disinfecting solutions.    HPI [name] presents for a CPX and f/up -  He complains of a 3 week hx of productive of clear phlegm. The cough is improving.  Outpatient Medications Prior to Visit  Medication Sig Dispense Refill   acetaminophen (TYLENOL) 500 MG tablet Take 500 mg by mouth every 6 (six) hours as needed for mild pain or headache.     apixaban (ELIQUIS) 5 MG TABS tablet Take 1 tablet (5 mg total) by mouth 2 (two) times daily. 180 tablet 1   carvedilol (COREG) 3.125 MG tablet Take 3.125 mg by mouth 2 (two) times daily.     carvedilol (COREG) 6.25 MG tablet TAKE 1 TABLET BY MOUTH 2 TIMES DAILY WITH A MEAL. (Patient taking differently: Take 9.375 mg by mouth in the morning and at bedtime.) 180 tablet 3   Cholecalciferol (VITAMIN D-3) 25 MCG (1000 UT) CAPS Take 1,000 Units by mouth daily.     dofetilide (TIKOSYN) 125 MCG capsule TAKE 1 CAPSULE BY MOUTH TWICE DAILY 180 capsule 3   fexofenadine (ALLEGRA) 180 MG tablet Take 180 mg by mouth daily as needed for allergies or rhinitis.      fluocinolone (VANOS) 0.01 % cream Apply topically 2 (two) times daily. (Patient taking differently: Apply 1 application topically 2 (two) times daily as needed (rash).) 30 g 0   fluticasone (FLONASE) 50 MCG/ACT nasal spray Place 2 sprays into both nostrils daily as needed for allergies or rhinitis. 48 g 1   hydrocortisone (ANUSOL-HC) 2.5 % rectal cream Place 1 application rectally 3 (three) times daily as needed for hemorrhoids. 30 g 1   Hypromellose (ARTIFICIAL TEARS OP)  Place 1 drop into both eyes 4 (four) times daily as needed (dry eyes).     ipratropium (ATROVENT) 0.06 % nasal spray PLACE 2 SPRAYS INTO BOTH NOSTRILS 4 (FOUR) TIMES DAILY AS NEEDED FOR RHINITIS. 90 mL 1   levocetirizine (XYZAL) 5 MG tablet Take 1 tablet (5 mg total) by mouth every evening. (Patient taking differently: Take 5 mg by mouth at bedtime as needed for allergies.) 90 tablet 1   nitroGLYCERIN (NITROSTAT) 0.4 MG SL tablet Place 1 tablet (0.4 mg total) under the tongue every 5 (five) minutes as needed for chest pain. 25 tablet 3   rosuvastatin (CRESTOR) 10 MG tablet TAKE 1 TABLET EVERY DAY 30 tablet 0   sacubitril-valsartan (ENTRESTO) 97-103 MG Take 1 tablet by mouth 2 (two) times daily. 180 tablet 1   sodium chloride (OCEAN) 0.65 % SOLN nasal spray Place 1-2 sprays into both nostrils as needed for congestion.     spironolactone (ALDACTONE) 25 MG tablet Take 2 tablets (50 mg total) by mouth daily. 180 tablet 3   traMADol (ULTRAM) 50 MG tablet Take 1 tablet (50 mg total) by mouth every 6 (six) hours as needed. 4 tablet 0   triamcinolone (NASACORT) 55 MCG/ACT AERO nasal inhaler Place 2 sprays into the nose at bedtime.     triamcinolone cream (KENALOG) 0.1 % Apply 1 application topically daily as needed (to affected areas, for itching).  Wheat Dextrin (BENEFIBER DRINK MIX PO) Take 2 Scoops by mouth daily in the afternoon. Take 2 tablespoons daily     No facility-administered medications prior to visit.    ROS Review of Systems  Constitutional:  Negative for chills, diaphoresis, fatigue and fever.  HENT:  Positive for postnasal drip. Negative for trouble swallowing.   Eyes: Negative.   Respiratory:  Positive for cough. Negative for chest tightness, shortness of breath and wheezing.   Cardiovascular:  Negative for chest pain, palpitations and leg swelling.  Gastrointestinal:  Negative for abdominal pain, blood in stool, constipation, diarrhea and nausea.  Genitourinary:  Negative for  difficulty urinating.  Musculoskeletal: Negative.  Negative for arthralgias.  Skin: Negative.   Neurological:  Negative for dizziness, weakness, light-headedness, numbness and headaches.  Hematological:  Negative for adenopathy. Does not bruise/bleed easily.  Psychiatric/Behavioral: Negative.     Objective:  BP (!) 144/66 (BP Location: Left Arm, Patient Position: Sitting, Cuff Size: Large)   Pulse 66   Temp 97.9 F (36.6 C) (Oral)   Resp 16   Ht 5\' 9"  (1.753 m)   Wt 176 lb (79.8 kg)   SpO2 97%   BMI 25.99 kg/m   BP Readings from Last 3 Encounters:  03/07/21 (!) 144/66  02/23/21 (!) 147/62  02/21/21 110/60    Wt Readings from Last 3 Encounters:  03/07/21 176 lb (79.8 kg)  02/23/21 175 lb (79.4 kg)  02/21/21 175 lb (79.4 kg)    Physical Exam Vitals reviewed.  HENT:     Nose: Nose normal.     Mouth/Throat:     Mouth: Mucous membranes are moist.  Eyes:     Conjunctiva/sclera: Conjunctivae normal.  Cardiovascular:     Rate and Rhythm: Normal rate. Rhythm irregularly irregular.     Heart sounds: Normal heart sounds. No murmur heard.   No gallop.  Pulmonary:     Effort: No tachypnea, accessory muscle usage or respiratory distress.     Breath sounds: Examination of the left-lower field reveals decreased breath sounds. Decreased breath sounds present. No wheezing, rhonchi or rales.  Abdominal:     General: Abdomen is flat.     Palpations: There is no mass.     Tenderness: There is no abdominal tenderness. There is no guarding.     Hernia: No hernia is present.  Musculoskeletal:     Cervical back: Neck supple.     Right lower leg: No edema.     Left lower leg: No edema.  Lymphadenopathy:     Cervical: No cervical adenopathy.  Skin:    General: Skin is warm and dry.  Neurological:     General: No focal deficit present.     Mental Status: He is alert.    Lab Results  Component Value Date   WBC 4.5 02/16/2021   HGB 12.7 (L) 02/16/2021   HCT 40.3 02/16/2021    PLT 136 (L) 02/16/2021   GLUCOSE 107 (H) 02/16/2021   CHOL 123 06/16/2020   TRIG 93 06/16/2020   HDL 51 06/16/2020   LDLCALC 53 06/16/2020   ALT 9 06/30/2020   AST 12 06/30/2020   NA 143 02/16/2021   K 4.4 02/16/2021   CL 103 02/16/2021   CREATININE 1.06 02/16/2021   BUN 20 02/16/2021   CO2 25 02/16/2021   TSH 2.28 03/07/2021   PSA 0.015 12/21/2020   INR 1.1 01/06/2021   HGBA1C 6.5 (H) 12/28/2020   MICROALBUR 5.7 01/06/2020    DG Chest 2 View  Result Date: 03/07/2021 CLINICAL DATA:  Cough, shortness of breath EXAM: CHEST - 2 VIEW COMPARISON:  12/31/2019 FINDINGS: The heart size and mediastinal contours are within normal limits. Aortic arch calcifications. Both lungs are clear. No acute osseous abnormality. Degenerative changes in the thoracic spine. IMPRESSION: No active cardiopulmonary disease. Electronically Signed   By: Merilyn Baba M.D.   On: 03/07/2021 14:29     Assessment & Plan:   Jenesis was seen today for annual exam, atrial fibrillation, cough and diabetes.  Diagnoses and all orders for this visit:  Anemia, iron deficiency, inadequate dietary intake -     IBC + Ferritin; Future -     IBC + Ferritin -     Ferric Maltol (ACCRUFER) 30 MG CAPS; Take 1 capsule by mouth in the morning and at bedtime.  Thrombocytopenia ([place])- This is likely caused by B12 defic. -     Vitamin B12; Future -     Folate; Future -     Folate -     Vitamin B12  Essential hypertension- His BP is well controlled. -     TSH; Future -     TSH  Flu vaccine need -     Flu Vaccine QUAD High Dose(Fluad)  Cough productive of clear sputum- His CXR is normal. This is likely a URI. -     DG Chest 2 View; Future  Vitamin B12 deficiency anemia due to intrinsic factor deficiency- I have asked him to start parenteral B12 replacement tx.  Iron deficiency anemia secondary to inadequate dietary iron intake  Encounter for general adult medical examination with abnormal findings- Exam completed,  labs reviewed, vaccines reviewed and updated, no cancer screenings indicated, pt ed material was given.  I am having Princess Bruins. Mahlum "Bill" start on [place]. I am also having him maintain his Hypromellose (ARTIFICIAL TEARS OP), triamcinolone cream, fexofenadine, acetaminophen, sodium chloride, Vitamin D-3, fluticasone, nitroGLYCERIN, Wheat Dextrin (BENEFIBER DRINK MIX PO), spironolactone, apixaban, sacubitril-valsartan, levocetirizine, carvedilol, fluocinolone, ipratropium, triamcinolone, traMADol, carvedilol, hydrocortisone, dofetilide, and rosuvastatin.  Meds ordered this encounter  Medications   Ferric Maltol (ACCRUFER) 30 MG CAPS    Sig: Take 1 capsule by mouth in the morning and at bedtime.    Dispense:  180 capsule    Refill:  1      Follow-up: Return in about 3 months (around 06/07/2021).  [name], MD

## 2021-03-07 NOTE — Patient Instructions (Signed)
Health Maintenance, Male Adopting a healthy lifestyle and getting preventive care are important in promoting health and wellness. Ask your health care provider about: The right schedule for you to have regular tests and exams. Things you can do on your own to prevent diseases and keep yourself healthy. What should I know about diet, weight, and exercise? Eat a healthy diet  Eat a diet that includes plenty of vegetables, fruits, low-fat dairy products, and lean protein. Do not eat a lot of foods that are high in solid fats, added sugars, or sodium. Maintain a healthy weight Body mass index (BMI) is a measurement that can be used to identify possible weight problems. It estimates body fat based on height and weight. Your health care provider can help determine your BMI and help you achieve or maintain a healthy weight. Get regular exercise Get regular exercise. This is one of the most important things you can do for your health. Most adults should: Exercise for at least 150 minutes each week. The exercise should increase your heart rate and make you sweat (moderate-intensity exercise). Do strengthening exercises at least twice a week. This is in addition to the moderate-intensity exercise. Spend less time sitting. Even light physical activity can be beneficial. Watch cholesterol and blood lipids Have your blood tested for lipids and cholesterol at 85 years of age, then have this test every 5 years. You may need to have your cholesterol levels checked more often if: Your lipid or cholesterol levels are high. You are older than 85 years of age. You are at high risk for heart disease. What should I know about cancer screening? Many types of cancers can be detected early and may often be prevented. Depending on your health history and family history, you may need to have cancer screening at various ages. This may include screening for: Colorectal cancer. Prostate cancer. Skin cancer. Lung  cancer. What should I know about heart disease, diabetes, and high blood pressure? Blood pressure and heart disease High blood pressure causes heart disease and increases the risk of stroke. This is more likely to develop in people who have high blood pressure readings, are of African descent, or are overweight. Talk with your health care provider about your target blood pressure readings. Have your blood pressure checked: Every 3-5 years if you are 18-39 years of age. Every year if you are 40 years old or older. If you are between the ages of 65 and 75 and are a current or former smoker, ask your health care provider if you should have a one-time screening for abdominal aortic aneurysm (AAA). Diabetes Have regular diabetes screenings. This checks your fasting blood sugar level. Have the screening done: Once every three years after age 45 if you are at a normal weight and have a low risk for diabetes. More often and at a younger age if you are overweight or have a high risk for diabetes. What should I know about preventing infection? Hepatitis B If you have a higher risk for hepatitis B, you should be screened for this virus. Talk with your health care provider to find out if you are at risk for hepatitis B infection. Hepatitis C Blood testing is recommended for: Everyone born from 1945 through 1965. Anyone with known risk factors for hepatitis C. Sexually transmitted infections (STIs) You should be screened each year for STIs, including gonorrhea and chlamydia, if: You are sexually active and are younger than 85 years of age. You are older than 85 years   of age and your health care provider tells you that you are at risk for this type of infection. Your sexual activity has changed since you were last screened, and you are at increased risk for chlamydia or gonorrhea. Ask your health care provider if you are at risk. Ask your health care provider about whether you are at high risk for HIV.  Your health care provider may recommend a prescription medicine to help prevent HIV infection. If you choose to take medicine to prevent HIV, you should first get tested for HIV. You should then be tested every 3 months for as long as you are taking the medicine. Follow these instructions at home: Lifestyle Do not use any products that contain nicotine or tobacco, such as cigarettes, e-cigarettes, and chewing tobacco. If you need help quitting, ask your health care provider. Do not use street drugs. Do not share needles. Ask your health care provider for help if you need support or information about quitting drugs. Alcohol use Do not drink alcohol if your health care provider tells you not to drink. If you drink alcohol: Limit how much you have to 0-2 drinks a day. Be aware of how much alcohol is in your drink. In the [place] one drink equals one 12 oz bottle of beer (355 mL), one 5 oz glass of wine (148 mL), or one 1 oz glass of hard liquor (44 mL). General instructions Schedule regular health, dental, and eye exams. Stay current with your vaccines. Tell your health care provider if: You often feel depressed. You have ever been abused or do not feel safe at home. Summary Adopting a healthy lifestyle and getting preventive care are important in promoting health and wellness. Follow your health care provider's instructions about healthy diet, exercising, and getting tested or screened for diseases. Follow your health care provider's instructions on monitoring your cholesterol and blood pressure. This information is not intended to replace advice given to you by your health care provider. Make sure you discuss any questions you have with your health care provider. Document Revised: 07/22/2020 Document Reviewed: 05/07/2018 Elsevier Patient Education  2022 Elsevier Inc.  

## 2021-03-08 DIAGNOSIS — D508 Other iron deficiency anemias: Secondary | ICD-10-CM | POA: Insufficient documentation

## 2021-03-08 DIAGNOSIS — D510 Vitamin B12 deficiency anemia due to intrinsic factor deficiency: Secondary | ICD-10-CM | POA: Insufficient documentation

## 2021-03-08 MED ORDER — ACCRUFER 30 MG PO CAPS: 1.0000 | ORAL_CAPSULE | Freq: Two times a day (BID) | ORAL | 1 refills | Status: DC

## 2021-03-10 DIAGNOSIS — Z0001 Encounter for general adult medical examination with abnormal findings: Secondary | ICD-10-CM | POA: Insufficient documentation

## 2021-03-23 ENCOUNTER — Encounter: Payer: Self-pay | Admitting: Internal Medicine

## 2021-03-24 ENCOUNTER — Other Ambulatory Visit: Payer: Self-pay

## 2021-03-24 ENCOUNTER — Ambulatory Visit (INDEPENDENT_AMBULATORY_CARE_PROVIDER_SITE_OTHER): Payer: HMO

## 2021-03-24 DIAGNOSIS — D510 Vitamin B12 deficiency anemia due to intrinsic factor deficiency: Secondary | ICD-10-CM | POA: Diagnosis not present

## 2021-03-24 MED ORDER — CYANOCOBALAMIN 1000 MCG/ML IJ SOLN
1000.0000 ug | Freq: Once | INTRAMUSCULAR | Status: AC
  Administered 2021-03-24: 1000 ug via INTRAMUSCULAR

## 2021-03-24 NOTE — Progress Notes (Signed)
B12 given w/o any complications. 

## 2021-03-24 NOTE — Telephone Encounter (Signed)
Pt was in office today for injection.

## 2021-03-28 ENCOUNTER — Ambulatory Visit (INDEPENDENT_AMBULATORY_CARE_PROVIDER_SITE_OTHER): Payer: HMO | Admitting: Pharmacist

## 2021-03-28 ENCOUNTER — Other Ambulatory Visit: Payer: Self-pay

## 2021-03-28 VITALS — BP 114/61 | HR 64

## 2021-03-28 DIAGNOSIS — I2510 Atherosclerotic heart disease of native coronary artery without angina pectoris: Secondary | ICD-10-CM

## 2021-03-28 DIAGNOSIS — E785 Hyperlipidemia, unspecified: Secondary | ICD-10-CM

## 2021-03-28 DIAGNOSIS — I519 Heart disease, unspecified: Secondary | ICD-10-CM

## 2021-03-28 DIAGNOSIS — E119 Type 2 diabetes mellitus without complications: Secondary | ICD-10-CM

## 2021-03-28 DIAGNOSIS — I4819 Other persistent atrial fibrillation: Secondary | ICD-10-CM

## 2021-03-28 DIAGNOSIS — D508 Other iron deficiency anemias: Secondary | ICD-10-CM

## 2021-03-28 MED ORDER — ROSUVASTATIN CALCIUM 10 MG PO TABS: 10.0000 mg | ORAL_TABLET | Freq: Every day | ORAL | 1 refills | Status: DC

## 2021-03-28 NOTE — Progress Notes (Signed)
Chronic Care Management Pharmacy Note  04/03/2021 Name:  Dillon Young MRN:  [id number] DOB:  [birth year]  Summary: -Pt endorses compliance with medications (including new ferric maltol); denies issues -BP at home is usually 110s/60s; he denies dizziness -Pt requested 90-day supply of rosuvastatin (had been getting 30-ds due to needing appt, had appt with PCP 03/07/21)  Recommendations/Changes made from today's visit: -No med changes -Refilled rosuvastatin for 90-ds per pt request   Subjective: Dillon Young is an 87 y.o. year old male who is a primary patient of Dillon Lima, MD.  The CCM team was consulted for assistance with disease management and care coordination needs.    Engaged with patient by telephone for follow up visit in response to provider referral for pharmacy case management and/or care coordination services.   Consent to Services:  The patient was given information about Chronic Care Management services, agreed to services, and gave verbal consent prior to initiation of services.  Please see initial visit note for detailed documentation.   Patient Care Team: Dillon Lima, MD as PCP - General (Internal Medicine) [name], MD as PCP - Cardiology (Cardiology) Charlton Haws, [facility] as Pharmacist (Pharmacist)  Recent office visits: 03/07/21 Dr Ronnald Ramp OV: CPE; IDA - start ferric maltol 30 mg BID; start B12 injections weekly x 3 wks then monthly   12/30/20 Dr Jenny Reichmann VV: acute sinusitis; rx'd zpak, prednisone, hycodan 02/23/20 Dr Ronnald Ramp OV: chronic f/u; c/o hematuria - reduced Eliquis to 2.5 mg BID  Recent consult visits: 02/21/21 Dr Carlean Purl (GI): f/u dysphagia, hemorrhoids; order EGD w/ possible dilation; try not to overuse hydrocortisone cream w/ hemorrhoids  09/16/20 Dr [name] (Vascular): f/u CHF. Increase spironolactone to 50 mg and stop hydralazine. No ASA w/ Eliquis.  07/28/20 [place] (Pharmacists) - Hypertension. Increase carvedilol to 1.5  tablets twice daily. Check BP at home. f/u 1 month.   06/30/20 [place] Gertie Fey) - Abdominal Pain. Start Dicyclomine HCl & Hydrocortisone 2.5%. Patient no longer takes gabapentin, hydralazine hcl, melatonin & tramadol hcl.   06/20/20 Evans (Podiatry) - foot swelling. Injection of 0.5cc Celestone Soluspan injected  Start Colchicine 0.6 mg. F/u prn.  06/07/20 [name] (Cardiology) -  Chronic systolic failure. No changes. F/u 6 months. F/u 12 months.   05/16/20 [place] (Cardiology) - Chronic systolic failure. No changes. F/u 6 months.   04/20/20 Evans (Podiatry) -New nail problem, Diabetic neuropathy. Start Gabapentin 100 mg. F/u prn.   Hospital visits: 01/06/21 admission for TURP  Objective:  Lab Results  Component Value Date   CREATININE 1.06 02/16/2021   BUN 20 02/16/2021   GFR 56.68 (L) 06/30/2020   GFRNONAA >60 12/28/2020   GFRAA 68 02/05/2020   NA 143 02/16/2021   K 4.4 02/16/2021   CALCIUM 9.3 02/16/2021   CO2 25 02/16/2021   GLUCOSE 107 (H) 02/16/2021    Lab Results  Component Value Date/Time   HGBA1C 6.5 (H) 12/28/2020 01:26 PM   HGBA1C 6.2 (H) 05/05/2020 09:03 AM   GFR 56.68 (L) 06/30/2020 12:17 PM   GFR 61.06 07/15/2019 09:10 AM   MICROALBUR 5.7 01/06/2020 11:56 AM   MICROALBUR 4.2 (H) 01/12/2019 03:53 PM    Last diabetic Eye exam:  Lab Results  Component Value Date/Time   HMDIABEYEEXA No Retinopathy 08/15/2020 12:00 AM    Last diabetic Foot exam: No results found for: HMDIABFOOTEX   Lab Results  Component Value Date   CHOL 123 06/16/2020   HDL 51 06/16/2020   LDLCALC 53 06/16/2020  TRIG 93 06/16/2020   CHOLHDL 2.4 06/16/2020    Hepatic Function Latest Ref Rng & Units 06/30/2020 02/05/2020 12/31/2019  Total Protein 6.0 - 8.3 g/dL 7.4 6.7 7.7  Albumin 3.5 - 5.2 g/dL 4.4 - 4.3  AST 0 - 37 U/L 12 12 32  ALT 0 - 53 U/L 9 6(L) 19  Alk Phosphatase 39 - 117 U/L 51 - 51  Total Bilirubin 0.2 - 1.2 mg/dL 0.8 0.4 1.0  Bilirubin, Direct 0.0 - 0.3 mg/dL - - -    Lab  Results  Component Value Date/Time   TSH 2.28 03/07/2021 01:47 PM   TSH 2.21 01/12/2019 03:53 PM    CBC Latest Ref Rng & Units 02/16/2021 12/28/2020 09/16/2020  WBC 3.4 - 10.8 x10E3/uL 4.5 5.6 4.9  Hemoglobin 13.0 - 17.7 g/dL 12.7(L) 12.7(L) 13.2  Hematocrit 37.5 - 51.0 % 40.3 40.2 42.0  Platelets 150 - 450 x10E3/uL 136(L) 150 174    Lab Results  Component Value Date/Time   VD25OH 31 09/06/2008 08:59 PM    Clinical ASCVD: Yes  The ASCVD Risk score (Arnett DK, et al., 2019) failed to calculate for the following reasons:   The 2019 ASCVD risk score is only valid for ages 40 to 58    Depression screen PHQ 2/9 09/15/2020 12/14/2019 06/25/2019  Decreased Interest 0 0 0  Down, Depressed, Hopeless 0 0 0  PHQ - 2 Score 0 0 0  Some recent data might be hidden    CHA2DS2-VASc Score = 8  The patient's score is based upon: CHF History: 1 HTN History: 1 Diabetes History: 1 Stroke History: 2 Vascular Disease History: 1 Age Score: 2 Gender Score: 0      Social History   Tobacco Use  Smoking Status Former   Packs/day: 1.50   Years: 60.00   Pack years: 90.00   Types: Cigarettes   Quit date: 07/25/2008   Years since quitting: 12.6  Smokeless Tobacco Never   BP Readings from Last 3 Encounters:  03/28/21 114/61  03/07/21 (!) 144/66  02/23/21 (!) 147/62   Pulse Readings from Last 3 Encounters:  03/28/21 64  03/07/21 66  02/23/21 62   Wt Readings from Last 3 Encounters:  03/07/21 176 lb (79.8 kg)  02/23/21 175 lb (79.4 kg)  02/21/21 175 lb (79.4 kg)   BMI Readings from Last 3 Encounters:  03/07/21 25.99 kg/m  02/23/21 25.84 kg/m  02/21/21 25.84 kg/m    Assessment/Interventions: Review of patient past medical history, allergies, medications, health status, including review of consultants reports, laboratory and other test data, was performed as part of comprehensive evaluation and provision of chronic care management services.   SDOH:  (Social Determinants of Health)  assessments and interventions performed: Yes  SDOH Screenings   Alcohol Screen: Low Risk    Last Alcohol Screening Score (AUDIT): 0  Depression (PHQ2-9): Low Risk    PHQ-2 Score: 0  Financial Resource Strain: Low Risk    Difficulty of Paying Living Expenses: Not hard at all  Food Insecurity: Not on file  Housing: [place] Risk Score: 0  Physical Activity: Sufficiently Active   Days of Exercise per Week: 5 days   Minutes of Exercise per Session: 30 min  Social Connections: Socially Integrated   Frequency of Communication with Friends and Family: More than three times a week   Frequency of Social Gatherings with Friends and Family: More than three times a week   Attends Religious Services: More than  4 times per year   Active Member of Clubs or Organizations: Yes   Attends Music therapist: More than 4 times per year   Marital Status: Married  Stress: No Stress Concern Present   Feeling of Stress : Not at all  Tobacco Use: Medium Risk   Smoking Tobacco Use: Former   Smokeless Tobacco Use: Never   Passive Exposure: Not on file  Transportation Needs: No Transportation Needs   Lack of Transportation (Medical): No   Lack of Transportation (Non-Medical): No    CCM Care Plan  Allergies  Allergen Reactions   Lipitor [Atorvastatin] Other (See Comments)    Muscle pain   Other Rash and Other (See Comments)    Detergent- Rashes on the skin    Medications Reviewed Today     Reviewed by Dillon Lima, MD (Physician) on 03/07/21 at 1341  Med List Status: <None>   Medication Order Taking? Sig Documenting Provider Last Dose Status Informant  acetaminophen (TYLENOL) 500 MG tablet [id number] Yes Take 500 mg by mouth every 6 (six) hours as needed for mild pain or headache. [provider] Taking Active Self           Med Note Wilmon Pali, MELISSA R   Thu Apr 28, 2020  4:40 PM)    apixaban (ELIQUIS) 5 MG TABS tablet [id number] Yes Take 1 tablet (5 mg  total) by mouth 2 (two) times daily. Larey Dresser, MD Taking Active Self  carvedilol (COREG) 3.125 MG tablet [id number] Yes Take 3.125 mg by mouth 2 (two) times daily. [provider] Taking Active   carvedilol (COREG) 6.25 MG tablet [id number] Yes TAKE 1 TABLET BY MOUTH 2 TIMES DAILY WITH A MEAL.  Patient taking differently: Take 9.375 mg by mouth in the morning and at bedtime.   Larey Dresser, MD Taking Active   Cholecalciferol (VITAMIN D-3) 25 MCG (1000 UT) CAPS [id number] Yes Take 1,000 Units by mouth daily. [provider] Taking Active Self  dofetilide (TIKOSYN) 125 MCG capsule [id number] Yes TAKE 1 CAPSULE BY MOUTH TWICE DAILY Bensimhon, Shaune Pascal, MD Taking Active   fexofenadine (ALLEGRA) 180 MG tablet [id number] Yes Take 180 mg by mouth daily as needed for allergies or rhinitis.  [provider] Taking Active Self           Med Note Elveria Royals May 16, 2020  2:43 PM)    fluocinolone (VANOS) 0.01 % cream [id number] Yes Apply topically 2 (two) times daily.  Patient taking differently: Apply 1 application topically 2 (two) times daily as needed (rash).   Rozetta Nunnery, MD Taking Active   fluticasone [facility]) 50 MCG/ACT nasal spray [id number] Yes Place 2 sprays into both nostrils daily as needed for allergies or rhinitis. Dillon Lima, MD Taking Active Self  hydrocortisone (ANUSOL-HC) 2.5 % rectal cream [id number] Yes Place 1 application rectally 3 (three) times daily as needed for hemorrhoids. Gatha Mayer, MD Taking Active   Hypromellose (ARTIFICIAL TEARS OP) [id number] Yes Place 1 drop into both eyes 4 (four) times daily as needed (dry eyes). [provider] Taking Active Self  ipratropium (ATROVENT) 0.06 % nasal spray [id number] Yes PLACE 2 SPRAYS INTO BOTH NOSTRILS 4 (FOUR) TIMES DAILY AS NEEDED FOR RHINITIS. Dillon Lima, MD Taking Active Self  levocetirizine (XYZAL) 5 MG tablet [id number] Yes Take 1 tablet (5 mg total) by mouth  every evening.  Patient taking differently: Take 5 mg by mouth at bedtime as needed  for allergies.   Dillon Lima, MD Taking Active   nitroGLYCERIN (NITROSTAT) 0.4 MG SL tablet [id number] Yes Place 1 tablet (0.4 mg total) under the tongue every 5 (five) minutes as needed for chest pain. [name], MD Taking Active Self  rosuvastatin (CRESTOR) 10 MG tablet [id number] Yes TAKE 1 TABLET EVERY DAY Dillon Lima, MD Taking Active   sacubitril-valsartan (ENTRESTO) 97-103 MG [id number] Yes Take 1 tablet by mouth 2 (two) times daily. [name], MD Taking Active Self  sodium chloride (OCEAN) 0.65 % SOLN nasal spray [id number] Yes Place 1-2 sprays into both nostrils as needed for congestion. [provider] Taking Active Self  spironolactone (ALDACTONE) 25 MG tablet [id number] Yes Take 2 tablets (50 mg total) by mouth daily. Larey Dresser, MD Taking Active Self  traMADol Veatrice Bourbon) 50 MG tablet [id number] Yes Take 1 tablet (50 mg total) by mouth every 6 (six) hours as needed. Irine Seal, MD Taking Active   triamcinolone (NASACORT) 55 MCG/ACT AERO nasal inhaler [id number] Yes Place 2 sprays into the nose at bedtime. [provider] Taking Active Self  triamcinolone cream (KENALOG) 0.1 % [id number] Yes Apply 1 application topically daily as needed (to affected areas, for itching).  [provider] Taking Active Self  Wheat Dextrin (BENEFIBER DRINK MIX PO) [id number] Yes Take 2 Scoops by mouth daily in the afternoon. Take 2 tablespoons daily [provider] Taking Active Self            Patient Active Problem List   Diagnosis Date Noted   Encounter for general adult medical examination with abnormal findings 03/10/2021   Vitamin B12 deficiency anemia due to intrinsic factor deficiency 03/08/2021   Iron deficiency anemia secondary to inadequate dietary iron intake 03/08/2021   Thrombocytopenia ([place]) 03/07/2021   Cough productive of clear sputum 03/07/2021    Flu vaccine need 02/24/2020   Anemia, iron deficiency, inadequate dietary intake 12/29/2019   Intrinsic eczema 01/12/2019   Chronic intractable headache 06/13/2018   CAD (coronary artery disease) 12/26/2017   Left ventricular dysfunction 11/06/2017   Persistent atrial fibrillation 10/02/2017   Aortic atherosclerosis ([place]) 05/02/2017   Primary osteoarthritis of both knees 03/13/2016   Bradycardia 12/15/2015   Routine general medical examination at a health care facility 08/16/2015   Middle insomnia 08/15/2015   Carotid artery disease- s/p RCE June 2013 11/01/2011   PVC's (premature ventricular contractions) 05/07/2011   Radiation proctitis 03/30/2011   Constipation, chronic 02/02/2011   Cerebrovascular disease 09/08/2008   Allergic rhinitis 09/08/2008   Diabetes mellitus without complication ([place]) 17/00/1749   Prostate cancer ([place]) 09/06/2008    Class: History of   Hyperlipidemia with target LDL less than 70 07/02/2008   Essential hypertension 07/02/2008   GERD 07/02/2008   Irritable bowel syndrome 07/02/2008   LOW BACK PAIN SYNDROME 07/02/2008    Immunization History  Administered Date(s) Administered   Fluad Quad(high Dose 65+) 02/27/2019, 02/23/2020, 03/07/2021   Influenza Split 03/09/2011, 03/06/2012, 03/11/2013   Influenza Whole 03/15/2009, 03/17/2010   Influenza, High Dose Seasonal PF 03/08/2016, 03/19/2017, 03/19/2018   Influenza,inj,Quad PF,6+ Mos 03/19/2014   Influenza-Unspecified 02/26/2015   PFIZER Comirnaty(Gray Top)Covid-19 Tri-Sucrose Vaccine 09/13/2020   PFIZER(Purple Top)SARS-COV-2 Vaccination 06/07/2019, 06/28/2019   Pneumococcal Conjugate-13 08/15/2015   Pneumococcal Polysaccharide-23 05/07/2011, 02/23/2020   Tdap 05/06/2012   Zoster Recombinat (Shingrix) 01/23/2019, 04/14/2019   Zoster, Live 08/20/2012    Conditions to be addressed/monitored:  Hypertension, Hyperlipidemia, Diabetes, Atrial Fibrillation, Heart Failure, Coronary Artery Disease, and  Chronic Kidney  Disease, Anemia  Care Plan : CCM Pharmacy Care Plan  Updates made by Charlton Haws, RPH since 04/03/2021 12:00 AM     Problem: Hypertension, Hyperlipidemia, Diabetes, Atrial Fibrillation, Heart Failure, Coronary Artery Disease, and Chronic Kidney Disease, Anemia   Priority: High     Long-Range Goal: Disease management   Start Date: 10/03/2020  Expected End Date: 10/09/2021  This Visit's Progress: On track  Recent Progress: On track  Priority: High  Note:   Current Barriers:  Unable to independently monitor therapeutic efficacy  Pharmacist Clinical Goal(s):  Patient will achieve adherence to monitoring guidelines and medication adherence to achieve therapeutic efficacy through collaboration with PharmD and provider.   Interventions: 1:1 collaboration with Dillon Lima, MD regarding development and update of comprehensive plan of care as evidenced by provider attestation and co-signature Inter-disciplinary care team collaboration (see longitudinal plan of care) Comprehensive medication review performed; medication list updated in electronic medical record  Heart Failure / HTN (Goal:BP < 140/90, prevent exacerbations) -Controlled - pt checks BP twice daily and reports BP is at goal on average -Last ejection fraction: 50-55% (Date: 04/2020) -HF type: Systolic -NYHA Class: II (slight limitation of activity) -Current home BP/HR readings:   03/28/21: 114/61, 64 107/59, 64 136/62, 70 -Current treatment: Carvedilol 3.125 mg BID Carvedilol 6.25 mg BID Entresto 97-103 mg BID - PAP Spironolactone 25 mg - 2 tab daily -Medications previously tried: amlodipine   -Educated on Benefits of medications for managing symptoms and prolonging life Importance of blood pressure control -Recommended to continue current medication  Hyperlipidemia / CAD: (LDL goal < 70) -Controlled - LDL is at goal; pt endorses compliance and denies issues -Pt reports he has been getting 30-day  supplies of rosuvastatin from his pharmacy and would prefer 90-ds -Current treatment: Rosuvastatin 10 mg daily Nitroglycerin 0.4 mg SL prn - not used -Current exercise habits: walks 4-5 days per week -Educated on Cholesterol goals; Benefits of statin for ASCVD risk reduction; -Recommended to continue current medication; refilled rosuvastatin for 90-day supply per pt request  Atrial Fibrillation (Goal: prevent stroke and major bleeding) -Controlled - pt reports compliance with medication, denies missed doses; he gets Tikosyn from [facility] $125/3 mos -CHADSVASC: 5 -Current treatment: Carvedilol 6.25 mg BID,  Dofetilide 125 mcg BID Eliquis 5 mg BID -Counseled on increased risk of stroke due to Afib and benefits of anticoagulation for stroke prevention; importance of adherence to anticoagulant exactly as prescribed; -Recommend to continue current medication  Anemia (Goal: manage symptoms; improve iron levels) -Controlled - pt reports compliance with new iron supplement; it comes from a specialty pharmacy and is about $30 per month; he wants to know how long he will have to take this -Current treatment  Ferric maltol 30 mg BID -Counseled iron supplement therapy varies, can be short term or indefinite depending on how well iron levels respond; he has appt with PCP 06/07/21 to follow up on iron levels -Recommended to continue current medication  Diabetes (Goal: A1c goal < 7%) -diet-controlled -advised to continue low-carb diet  Health Maintenance -Vaccine gaps: Covid booster -Advised to get covid booster  Patient Goals/Self-Care Activities Patient will:  - take medications as prescribed -focus on medication adherence by pill box -check blood pressure daily       Compliance/Adherence/Medication fill history: Care Gaps: Foot exam (due 01/12/20)  Star-Rating Drugs: Rosuvastatin - LF 03/07/21 x 30 ds  Medication Assistance: None required.  Patient affirms current coverage meets  needs.  Patient's preferred pharmacy is:  [facility] - [place], [place] [address] [place] [address] Phone: [phone] Fax: [phone]  [id number]: [phone]: [phone] [facility] [address] [phone]Phone: [phone]: [phone] [address] [address]Phone: [phone]: [phone] Uses pill box? Yes Pt endorses 100% compliance  We discussed: Current pharmacy is preferred with insurance plan and patient is satisfied with pharmacy services Patient decided to: Continue current medication management strategy  Care Plan and Follow Up Patient Decision:  Patient agrees to Care Plan and Follow-up.  Plan: Telephone follow up appointment with care management team member scheduled for:  6 months  LCharlene Brooke PharmD, [place] CPP Clinical Pharmacist [phone]

## 2021-03-31 ENCOUNTER — Ambulatory Visit (INDEPENDENT_AMBULATORY_CARE_PROVIDER_SITE_OTHER): Payer: HMO

## 2021-03-31 ENCOUNTER — Other Ambulatory Visit: Payer: Self-pay

## 2021-03-31 ENCOUNTER — Encounter: Payer: Self-pay | Admitting: Internal Medicine

## 2021-03-31 DIAGNOSIS — D510 Vitamin B12 deficiency anemia due to intrinsic factor deficiency: Secondary | ICD-10-CM

## 2021-03-31 MED ORDER — CYANOCOBALAMIN 1000 MCG/ML IJ SOLN
1000.0000 ug | Freq: Once | INTRAMUSCULAR | Status: AC
  Administered 2021-03-31: 1000 ug via INTRAMUSCULAR

## 2021-03-31 NOTE — Progress Notes (Signed)
Pt was given B12 injection w/o any complications. 

## 2021-04-03 NOTE — Patient Instructions (Signed)
Visit Information  Phone number for Pharmacist: [phone]   Goals Addressed             This Visit's Progress    Manage My Medicine       Timeframe:  Long-Range Goal Priority:  Medium Start Date:      10/03/20                       Expected End Date: 10/03/21                       Follow Up Date May 2023   - call for medicine refill 2 or 3 days before it runs out - call if I am sick and can't take my medicine - keep a list of all the medicines I take; vitamins and herbals too - use a pillbox to sort medicine    Why is this important?   These steps will help you keep on track with your medicines.   Notes:         Care Plan : CCM Pharmacy Care Plan  Updates made by Charlton Haws, RPH since 04/03/2021 12:00 AM     Problem: Hypertension, Hyperlipidemia, Diabetes, Atrial Fibrillation, Heart Failure, Coronary Artery Disease, and Chronic Kidney Disease, Anemia   Priority: High     Long-Range Goal: Disease management   Start Date: 10/03/2020  Expected End Date: 10/09/2021  This Visit's Progress: On track  Recent Progress: On track  Priority: High  Note:   Current Barriers:  Unable to independently monitor therapeutic efficacy  Pharmacist Clinical Goal(s):  Patient will achieve adherence to monitoring guidelines and medication adherence to achieve therapeutic efficacy through collaboration with PharmD and provider.   Interventions: 1:1 collaboration with [name], MD regarding development and update of comprehensive plan of care as evidenced by provider attestation and co-signature Inter-disciplinary care team collaboration (see longitudinal plan of care) Comprehensive medication review performed; medication list updated in electronic medical record  Heart Failure / HTN (Goal:BP < 140/90, prevent exacerbations) -Controlled - pt checks BP twice daily and reports BP is at goal on average -Last ejection fraction: 50-55% (Date: 04/2020) -HF type:  Systolic -NYHA Class: II (slight limitation of activity) -Current home BP/HR readings:   03/28/21: 114/61, 64 107/59, 64 136/62, 70 -Current treatment: Carvedilol 3.125 mg BID Carvedilol 6.25 mg BID Entresto 97-103 mg BID - PAP Spironolactone 25 mg - 2 tab daily -Medications previously tried: amlodipine   -Educated on Benefits of medications for managing symptoms and prolonging life Importance of blood pressure control -Recommended to continue current medication  Hyperlipidemia / CAD: (LDL goal < 70) -Controlled - LDL is at goal; pt endorses compliance and denies issues -Pt reports he has been getting 30-day supplies of rosuvastatin from his pharmacy and would prefer 90-ds -Current treatment: Rosuvastatin 10 mg daily Nitroglycerin 0.4 mg SL prn - not used -Current exercise habits: walks 4-5 days per week -Educated on Cholesterol goals; Benefits of statin for ASCVD risk reduction; -Recommended to continue current medication; refilled rosuvastatin for 90-day supply per pt request  Atrial Fibrillation (Goal: prevent stroke and major bleeding) -Controlled - pt reports compliance with medication, denies missed doses; he gets Tikosyn from [facility] $125/3 mos -CHADSVASC: 5 -Current treatment: Carvedilol 6.25 mg BID,  Dofetilide 125 mcg BID Eliquis 5 mg BID -Counseled on increased risk of stroke due to Afib and benefits of anticoagulation for stroke prevention; importance of adherence to anticoagulant exactly as prescribed; -Recommend  to continue current medication  Anemia (Goal: manage symptoms; improve iron levels) -Controlled - pt reports compliance with new iron supplement; it comes from a specialty pharmacy and is about $30 per month; he wants to know how long he will have to take this -Current treatment  Ferric maltol 30 mg BID -Counseled iron supplement therapy varies, can be short term or indefinite depending on how well iron levels respond; he has appt with PCP 06/07/21 to  follow up on iron levels -Recommended to continue current medication  Diabetes (Goal: A1c goal < 7%) -diet-controlled -advised to continue low-carb diet  Health Maintenance -Vaccine gaps: Covid booster -Advised to get covid booster  Patient Goals/Self-Care Activities Patient will:  - take medications as prescribed -focus on medication adherence by pill box -check blood pressure daily       Patient verbalizes understanding of instructions provided today and agrees to view in [place].  Telephone follow up appointment with pharmacy team member scheduled for: 6 months  Charlene Brooke, PharmD, [place], CPP Clinical Pharmacist [facility] [phone]

## 2021-04-07 ENCOUNTER — Other Ambulatory Visit: Payer: Self-pay

## 2021-04-07 ENCOUNTER — Ambulatory Visit (INDEPENDENT_AMBULATORY_CARE_PROVIDER_SITE_OTHER): Payer: HMO

## 2021-04-07 DIAGNOSIS — D510 Vitamin B12 deficiency anemia due to intrinsic factor deficiency: Secondary | ICD-10-CM

## 2021-04-07 MED ORDER — CYANOCOBALAMIN 1000 MCG/ML IJ SOLN
1000.0000 ug | Freq: Once | INTRAMUSCULAR | Status: AC
  Administered 2021-04-07: 1000 ug via INTRAMUSCULAR

## 2021-04-07 NOTE — Progress Notes (Signed)
B12 given Please cosign 

## 2021-04-12 NOTE — Telephone Encounter (Signed)
Pt came to office today to drop off paperwork. Will have Dr. [name] sign and complete pt assistance application. Pt verbalized understanding.

## 2021-04-14 DIAGNOSIS — N3945 Continuous leakage: Secondary | ICD-10-CM | POA: Diagnosis not present

## 2021-04-14 DIAGNOSIS — Z8551 Personal history of malignant neoplasm of bladder: Secondary | ICD-10-CM | POA: Diagnosis not present

## 2021-04-17 ENCOUNTER — Other Ambulatory Visit: Payer: Self-pay | Admitting: Internal Medicine

## 2021-04-17 ENCOUNTER — Encounter: Payer: Self-pay | Admitting: Internal Medicine

## 2021-04-18 ENCOUNTER — Encounter: Payer: Self-pay | Admitting: Internal Medicine

## 2021-04-18 ENCOUNTER — Other Ambulatory Visit: Payer: Self-pay | Admitting: Internal Medicine

## 2021-04-18 DIAGNOSIS — J370 Chronic laryngitis: Secondary | ICD-10-CM | POA: Insufficient documentation

## 2021-04-22 IMAGING — MR MR HEAD W/O CM
10 series · 48 of 48 positions shown · non-contrast
Comparison: 02/06/2018 head CT.  10/12/2004 MRI/MRA head.

CLINICAL DATA: Headache

EXAM:
MRI HEAD WITHOUT CONTRAST
TECHNIQUE: Multiplanar, multiecho pulse sequences of the brain and surrounding
structures were obtained without intravenous contrast.

[Series 3: T1 · sagittal · 5.0mm · 0.45mm/px · 3 of 24 slices shown]
[im 1/24]
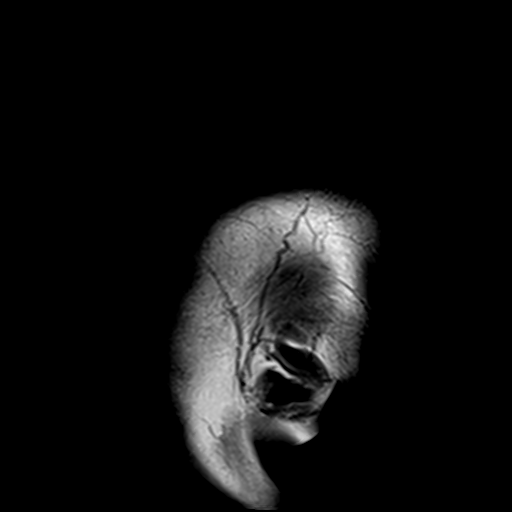
[im 12/24]
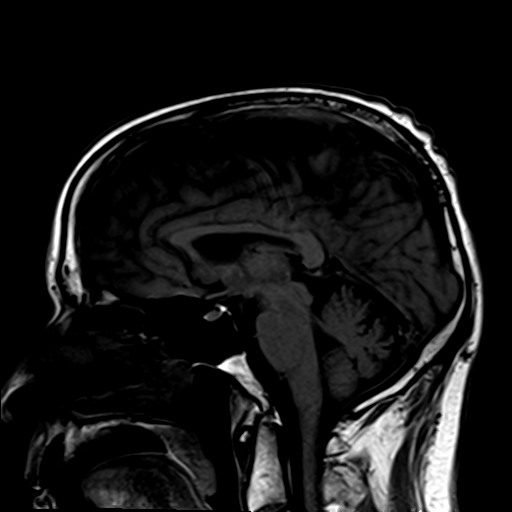
[im 24/24]
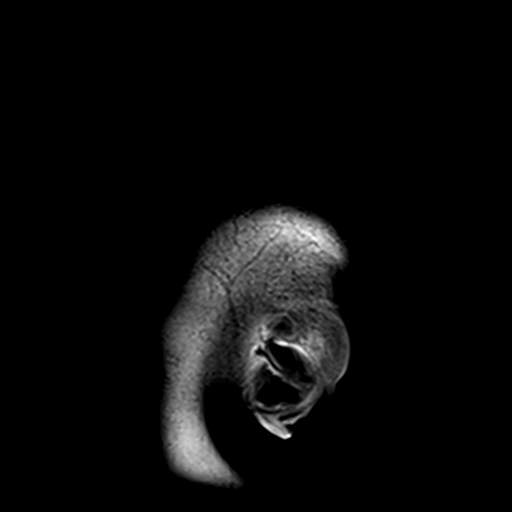

[Series 4: DWI · axial · 3.0mm · 1.80mm/px · z∈[-57,+98]mm · 10 of 106 slices shown (1 of 4)]
[im 1/106]
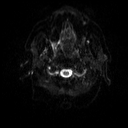
[im 12/106]
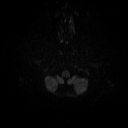
[im 24/106]
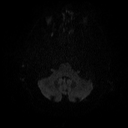
[im 36/106]
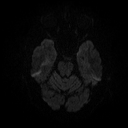
[im 47/106]
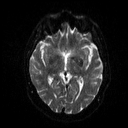
[im 59/106]
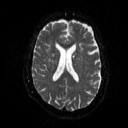
[im 71/106]
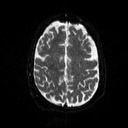
[im 82/106]
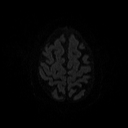
[im 94/106]
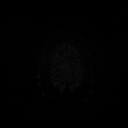
[im 106/106]
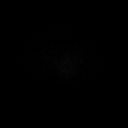

[Series 5: DWI · axial · 3.0mm · 1.80mm/px · z∈[-57,+98]mm · 4 of 51 slices shown (2 of 4)]
[im 1/51]
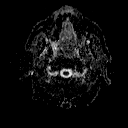
[im 17/51]
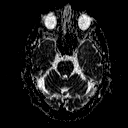
[im 34/51]
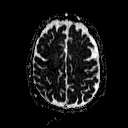
[im 51/51]
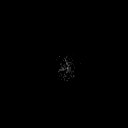

[Series 6: DWI · coronal · 5.0mm · 1.80mm/px · 6 of 74 slices shown (3 of 4)]
[im 1/74]
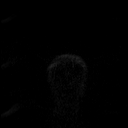
[im 15/74]
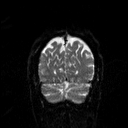
[im 30/74]
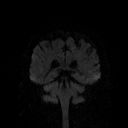
[im 44/74]
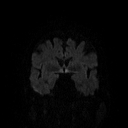
[im 59/74]
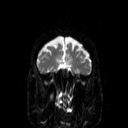
[im 74/74]
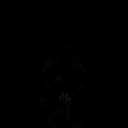

[Series 7: DWI · coronal · 5.0mm · 1.80mm/px · 3 of 37 slices shown (4 of 4)]
[im 1/37]
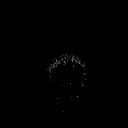
[im 19/37]
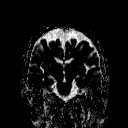
[im 37/37]
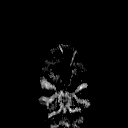

[Series 8: T2 · axial · 5.0mm · 0.72mm/px · z∈[-59,+96]mm · 2 of 24 slices shown (1 of 2)]
[im 1/24]
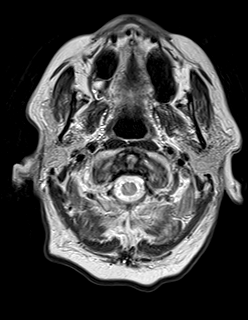
[im 24/24]
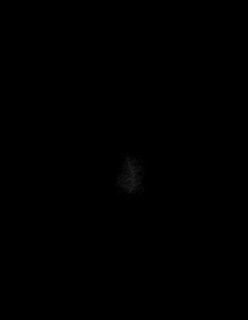

[Series 9: FLAIR · axial · 3.0mm · 0.45mm/px · z∈[-58,+99]mm · 3 of 35 slices shown]
[im 1/35]
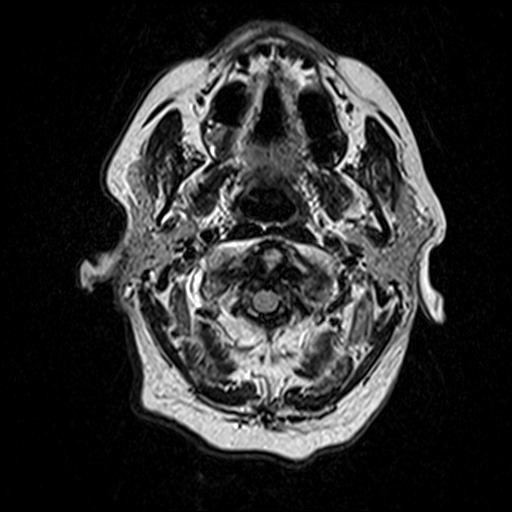
[im 18/35]
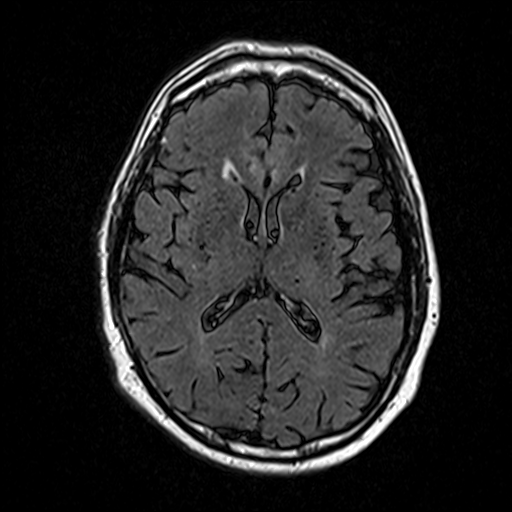
[im 35/35]
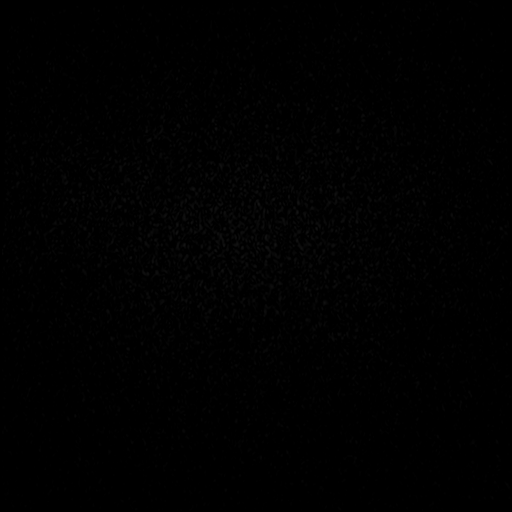

[Series 11: swi_images · axial · 4.0mm · 0.90mm/px · z∈[-58,+98]mm · 3 of 40 slices shown]
[im 1/40]
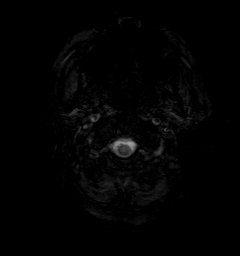
[im 20/40]
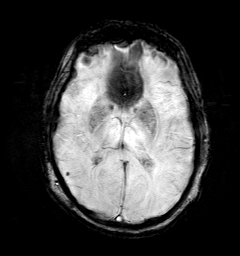
[im 40/40]
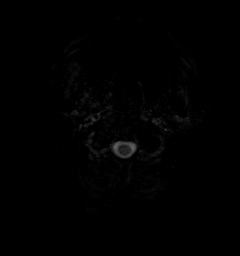

[Series 12: T2 · coronal · 5.0mm · 0.45mm/px · 2 of 28 slices shown (2 of 2)]
[im 1/28]
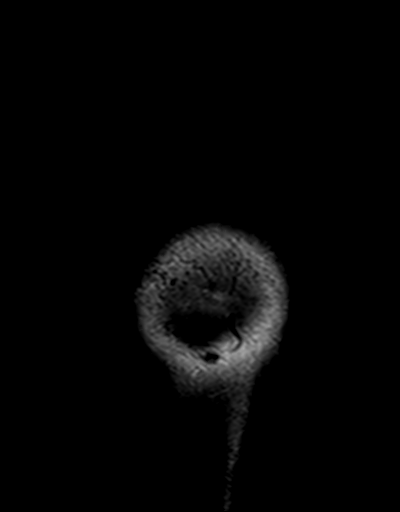
[im 28/28]
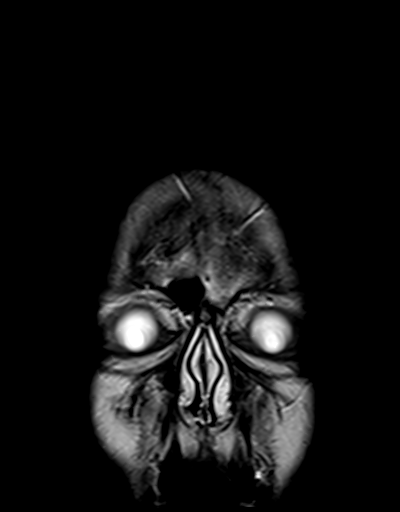

[Series 13: t1_mpr_tra · axial · 1.0mm · 0.75mm/px · z∈[-56,+94]mm · 12 of 144 slices shown]
[im 1/144]
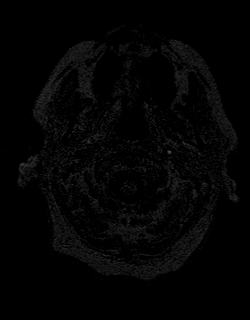
[im 14/144]
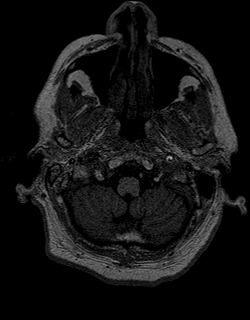
[im 27/144]
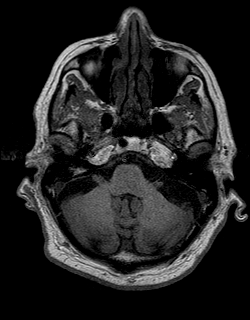
[im 40/144]
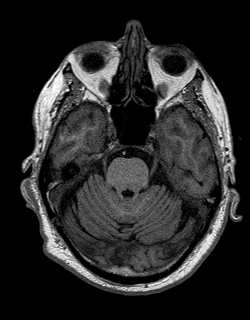
[im 53/144]
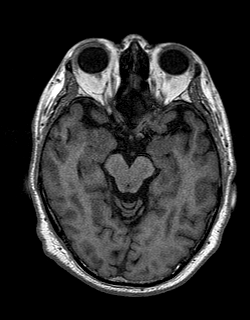
[im 66/144]
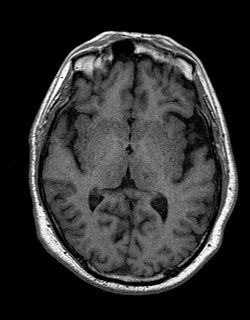
[im 79/144]
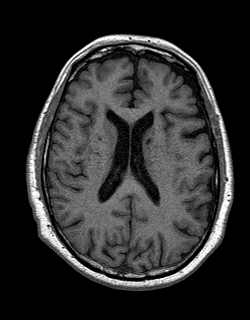
[im 92/144]
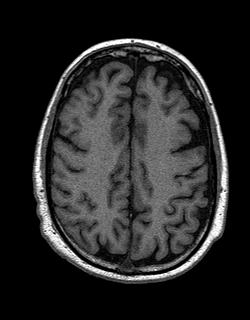
[im 105/144]
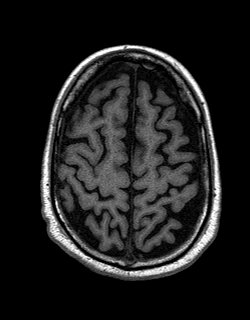
[im 118/144]
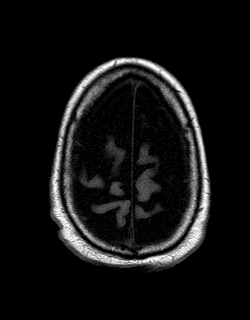
[im 131/144]
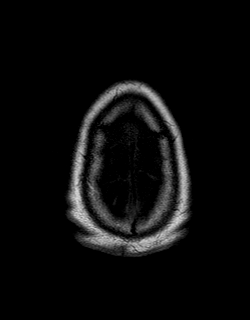
[im 144/144]
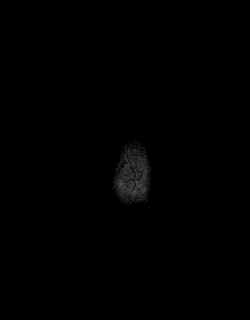

[48 of 48 positions shown; findings below may reference images not displayed]

FINDINGS: Brain: Mild frontoparietal volume loss with ex vacuo dilatation.
Sequela of remote left caudate body and left thalamic insults. Mild
chronic microvascular ischemic changes.

No acute infarct. Remote right occipital microhemorrhage. No midline
shift, ventriculomegaly or extra-axial fluid collection. No mass
lesion.

Vascular: Major intracranial flow voids are grossly preserved.

Skull and upper cervical spine: Normal marrow signal.

Sinuses/Orbits: Normal orbits. Sequela of bilateral lens
replacement. Mild ethmoid and right maxillary sinus mucosal
thickening. Trace right mastoid effusion.

Other: None.
IMPRESSION: No acute intracranial process. Sequela of remote left caudate body
and left thalamic insults.

Remote right occipital microhemorrhage.

Mild frontoparietal atrophy, progressed since prior exam. Mild
chronic microvascular ischemic changes.

## 2021-04-26 ENCOUNTER — Ambulatory Visit: Payer: HMO

## 2021-04-26 DIAGNOSIS — E1159 Type 2 diabetes mellitus with other circulatory complications: Secondary | ICD-10-CM | POA: Diagnosis not present

## 2021-04-26 DIAGNOSIS — I2510 Atherosclerotic heart disease of native coronary artery without angina pectoris: Secondary | ICD-10-CM

## 2021-04-26 DIAGNOSIS — I4819 Other persistent atrial fibrillation: Secondary | ICD-10-CM

## 2021-04-26 DIAGNOSIS — D508 Other iron deficiency anemias: Secondary | ICD-10-CM | POA: Diagnosis not present

## 2021-04-26 DIAGNOSIS — E785 Hyperlipidemia, unspecified: Secondary | ICD-10-CM | POA: Diagnosis not present

## 2021-04-26 DIAGNOSIS — I10 Essential (primary) hypertension: Secondary | ICD-10-CM

## 2021-05-04 ENCOUNTER — Telehealth: Payer: Self-pay

## 2021-05-04 ENCOUNTER — Other Ambulatory Visit (HOSPITAL_COMMUNITY): Payer: Self-pay | Admitting: Cardiology

## 2021-05-04 NOTE — Telephone Encounter (Signed)
[employer] pt assistance form (entresto) signed by Dr. [name] and faxed with successful fax confirmation.

## 2021-05-10 ENCOUNTER — Encounter: Payer: Self-pay | Admitting: Internal Medicine

## 2021-05-10 ENCOUNTER — Ambulatory Visit (INDEPENDENT_AMBULATORY_CARE_PROVIDER_SITE_OTHER): Payer: HMO

## 2021-05-10 DIAGNOSIS — D510 Vitamin B12 deficiency anemia due to intrinsic factor deficiency: Secondary | ICD-10-CM

## 2021-05-10 MED ORDER — CYANOCOBALAMIN 1000 MCG/ML IJ SOLN
1000.0000 ug | Freq: Once | INTRAMUSCULAR | Status: AC
  Administered 2021-05-10: 10:00:00 1000 ug via INTRAMUSCULAR

## 2021-05-10 NOTE — Progress Notes (Signed)
Pt was given B12 injection w/o any complications. 

## 2021-05-11 ENCOUNTER — Ambulatory Visit (INDEPENDENT_AMBULATORY_CARE_PROVIDER_SITE_OTHER): Payer: HMO | Admitting: Emergency Medicine

## 2021-05-11 ENCOUNTER — Encounter: Payer: Self-pay | Admitting: Emergency Medicine

## 2021-05-11 VITALS — BP 144/88 | Temp 98.4°F | Ht 69.0 in | Wt 179.0 lb

## 2021-05-11 DIAGNOSIS — J45909 Unspecified asthma, uncomplicated: Secondary | ICD-10-CM

## 2021-05-11 MED ORDER — ALBUTEROL SULFATE HFA 108 (90 BASE) MCG/ACT IN AERS
2.0000 | INHALATION_SPRAY | Freq: Four times a day (QID) | RESPIRATORY_TRACT | 3 refills | Status: DC | PRN
Start: 2021-05-11 — End: 2021-05-31

## 2021-05-11 MED ORDER — AMOXICILLIN-POT CLAVULANATE 875-125 MG PO TABS: 1.0000 | ORAL_TABLET | Freq: Two times a day (BID) | ORAL | 0 refills | Status: AC

## 2021-05-11 MED ORDER — PREDNISONE 20 MG PO TABS: 40.0000 mg | ORAL_TABLET | Freq: Every day | ORAL | 0 refills | Status: AC

## 2021-05-11 NOTE — Patient Instructions (Signed)
Acute Bronchitis, Adult ?Acute bronchitis is when air tubes in the lungs (bronchi) suddenly get swollen. The condition can make it hard for you to breathe. In adults, acute bronchitis usually goes away within 2 weeks. A cough caused by bronchitis may last up to 3 weeks. Smoking, allergies, and asthma can make the condition worse. ?What are the causes? ?Germs that cause cold and flu (viruses). The most common cause of this condition is the virus that causes the common cold. ?Bacteria. ?Substances that bother (irritate) the lungs, including: ?Smoke from cigarettes and other types of tobacco. ?Dust and pollen. ?Fumes from chemicals, gases, or burned fuel. ?Indoor or outdoor air pollution. ?What increases the risk? ?A weak body's defense system. This is also called the immune system. ?Any condition that affects your lungs and breathing, such as asthma. ?What are the signs or symptoms? ?A cough. ?Coughing up clear, yellow, or green mucus. ?Making high-pitched whistling sounds when you breathe, most often when you breathe out (wheezing). ?Runny or stuffy nose. ?Having too much mucus in your lungs (chest congestion). ?Shortness of breath. ?Body aches. ?A sore throat. ?How is this treated? ?Acute bronchitis may go away over time without treatment. Your doctor may tell you to: ?Drink more fluids. This will help thin your mucus so it is easier to cough up. ?Use a device that gets medicine into your lungs (inhaler). ?Use a vaporizer or a humidifier. These are machines that add water to the air. This helps with coughing and poor breathing. ?Take a medicine that thins mucus and helps clear it from your lungs. ?Take a medicine that prevents or stops coughing. ?It is not common to take an antibiotic medicine for this condition. ?Follow these instructions at home: ? ?Take over-the-counter and prescription medicines only as told by your doctor. ?Use an inhaler, vaporizer, or humidifier as told by your doctor. ?Take two teaspoons (10  mL) of honey at bedtime. This helps lessen your coughing at night. ?Drink enough fluid to keep your pee (urine) pale yellow. ?Do not smoke or use any products that contain nicotine or tobacco. If you need help quitting, ask your doctor. ?Get a lot of rest. ?Return to your normal activities when your doctor says that it is safe. ?Keep all follow-up visits. ?How is this prevented? ? ?Wash your hands often with soap and water for at least 20 seconds. If you cannot use soap and water, use hand sanitizer. ?Avoid contact with people who have cold symptoms. ?Try not to touch your mouth, nose, or eyes with your hands. ?Avoid breathing in smoke or chemical fumes. ?Make sure to get the flu shot every year. ?Contact a doctor if: ?Your symptoms do not get better in 2 weeks. ?You have trouble coughing up the mucus. ?Your cough keeps you awake at night. ?You have a fever. ?Get help right away if: ?You cough up blood. ?You have chest pain. ?You have very bad shortness of breath. ?You faint or keep feeling like you are going to faint. ?You have a very bad headache. ?Your fever or chills get worse. ?These symptoms may be an emergency. Get help right away. Call your local emergency services (911 in the [place] ?Do not wait to see if the symptoms will go away. ?Do not drive yourself to the hospital. ?Summary ?Acute bronchitis is when air tubes in the lungs (bronchi) suddenly get swollen. In adults, acute bronchitis usually goes away within 2 weeks. ?Drink more fluids. This will help thin your mucus so it is easier to   cough up. ?Take over-the-counter and prescription medicines only as told by your doctor. ?Contact a doctor if your symptoms do not improve after 2 weeks of treatment. ?This information is not intended to replace advice given to you by your health care provider. Make sure you discuss any questions you have with your health care provider. ?Document Revised: 09/14/2020 Document Reviewed: 09/14/2020 ?Elsevier Patient Education  ? 2022 [employer] ? ?

## 2021-05-11 NOTE — Progress Notes (Signed)
Dillon Young 88 y.o.   Chief Complaint  Patient presents with   Cough    2-3 days, productive, pt request predisone    HISTORY OF PRESENT ILLNESS: Acute problem visit today. This is a 87 y.o. male complaining of productive cough for the past 2 to 3 days.. Denies chest pain or difficulty breathing.  No fever or chills. No other complaints or medical concerns today.  Cough Pertinent negatives include no chest pain, chills, fever, headaches, rash, sore throat or shortness of breath.    Prior to Admission medications   Medication Sig Start Date End Date Taking? Authorizing Provider  acetaminophen (TYLENOL) 500 MG tablet Take 500 mg by mouth every 6 (six) hours as needed for mild pain or headache.   Yes [provider]  apixaban (ELIQUIS) 5 MG TABS tablet Take 1 tablet (5 mg total) by mouth 2 (two) times daily. 10/06/20  Yes Larey Dresser, MD  carvedilol (COREG) 3.125 MG tablet Take 1 tablet (3.125 mg total) by mouth 2 (two) times daily with a meal. NEEDS APPOINTMENT FOR MORE REFILLS 05/04/21  Yes Larey Dresser, MD  carvedilol (COREG) 6.25 MG tablet TAKE 1 TABLET BY MOUTH 2 TIMES DAILY WITH A MEAL. Patient taking differently: Take 9.375 mg by mouth in the morning and at bedtime. 11/07/20  Yes Larey Dresser, MD  Cholecalciferol (VITAMIN D-3) 25 MCG (1000 UT) CAPS Take 1,000 Units by mouth daily.   Yes [provider]  dofetilide (TIKOSYN) 125 MCG capsule TAKE 1 CAPSULE BY MOUTH TWICE DAILY 02/27/21  Yes Bensimhon, Shaune Pascal, MD  Ferric Maltol (ACCRUFER) 30 MG CAPS Take 1 capsule by mouth in the morning and at bedtime. 03/08/21  Yes [name], MD  fexofenadine (ALLEGRA) 180 MG tablet Take 180 mg by mouth daily as needed for allergies or rhinitis.    Yes [provider]  fluocinolone (VANOS) 0.01 % cream Apply topically 2 (two) times daily. Patient taking differently: Apply 1 application topically 2 (two) times daily as needed (rash). 12/03/20  Yes  Rozetta Nunnery, MD  fluticasone (FLONASE) 50 MCG/ACT nasal spray Place 2 sprays into both nostrils daily as needed for allergies or rhinitis. 01/25/20  Yes [name], MD  hydrocortisone (ANUSOL-HC) 2.5 % rectal cream Place 1 application rectally 3 (three) times daily as needed for hemorrhoids. 02/21/21  Yes Gatha Mayer, MD  Hypromellose (ARTIFICIAL TEARS OP) Place 1 drop into both eyes 4 (four) times daily as needed (dry eyes).   Yes [provider]  ipratropium (ATROVENT) 0.06 % nasal spray PLACE 2 SPRAYS INTO BOTH NOSTRILS 4 (FOUR) TIMES DAILY AS NEEDED FOR RHINITIS. 12/10/20  Yes [name], MD  levocetirizine (XYZAL) 5 MG tablet Take 1 tablet (5 mg total) by mouth every evening. Patient taking differently: Take 5 mg by mouth at bedtime as needed for allergies. 10/12/20  Yes [name], MD  nitroGLYCERIN (NITROSTAT) 0.4 MG SL tablet Place 1 tablet (0.4 mg total) under the tongue every 5 (five) minutes as needed for chest pain. 04/11/20  Yes [name], MD  rosuvastatin (CRESTOR) 10 MG tablet Take 1 tablet (10 mg total) by mouth daily. 03/28/21  Yes [name], MD  sacubitril-valsartan (ENTRESTO) 97-103 MG Take 1 tablet by mouth 2 (two) times daily. 10/10/20  Yes [name], MD  sodium chloride (OCEAN) 0.65 % SOLN nasal spray Place 1-2 sprays into both nostrils as needed for congestion.   Yes [provider]  spironolactone (ALDACTONE) 25 MG tablet Take 2 tablets (50 mg total) by mouth daily. 09/16/20  Yes Larey Dresser, MD  traMADol (ULTRAM) 50 MG tablet Take 1 tablet (50 mg total) by mouth every 6 (six) hours as needed. 01/06/21 01/06/22 Yes Irine Seal, MD  triamcinolone (NASACORT) 55 MCG/ACT AERO nasal inhaler Place 2 sprays into the nose at bedtime. 09/28/20  Yes [provider]  triamcinolone cream (KENALOG) 0.1 % Apply 1 application topically daily as needed (to affected areas, for itching).  01/06/19  Yes [provider]   Wheat Dextrin (BENEFIBER DRINK MIX PO) Take 2 Scoops by mouth daily in the afternoon. Take 2 tablespoons daily   Yes [provider]    Allergies  Allergen Reactions   Lipitor [Atorvastatin] Other (See Comments)    Muscle pain   Other Rash and Other (See Comments)    Detergent- Rashes on the skin    Patient Active Problem List   Diagnosis Date Noted   Laryngitis, chronic 04/18/2021   Encounter for general adult medical examination with abnormal findings 03/10/2021   Vitamin B12 deficiency anemia due to intrinsic factor deficiency 03/08/2021   Iron deficiency anemia secondary to inadequate dietary iron intake 03/08/2021   Thrombocytopenia ([place]) 03/07/2021   Cough productive of clear sputum 03/07/2021   Flu vaccine need 02/24/2020   Anemia, iron deficiency, inadequate dietary intake 12/29/2019   Intrinsic eczema 01/12/2019   Chronic intractable headache 06/13/2018   CAD (coronary artery disease) 12/26/2017   Left ventricular dysfunction 11/06/2017   Persistent atrial fibrillation 10/02/2017   Aortic atherosclerosis ([place]) 05/02/2017   Primary osteoarthritis of both knees 03/13/2016   Bradycardia 12/15/2015   Routine general medical examination at a health care facility 08/16/2015   Middle insomnia 08/15/2015   Carotid artery disease- s/p RCE June 2013 11/01/2011   PVC's (premature ventricular contractions) 05/07/2011   Radiation proctitis 03/30/2011   Constipation, chronic 02/02/2011   Cerebrovascular disease 09/08/2008   Allergic rhinitis 09/08/2008   Diabetes mellitus without complication ([place]) 47/42/5956   Prostate cancer ([place]) 09/06/2008    Class: History of   Hyperlipidemia with target LDL less than 70 07/02/2008   Essential hypertension 07/02/2008   GERD 07/02/2008   Irritable bowel syndrome 07/02/2008   LOW BACK PAIN SYNDROME 07/02/2008    Past Medical History:  Diagnosis Date   Adenomatous colon polyp    Allergic rhinitis    Anemia    Anxiety     Bladder cancer (HCC)    BPH (benign prostatic hyperplasia)    CAD (coronary artery disease)    sees Dr. [name]    Carotid artery disease [facility])    CHF (congestive heart failure) ([place])    Chronic kidney disease    Sones. Prostate cancer   Clotting disorder (HCC)    CVA (cerebral infarction)    Diabetes mellitus    "Borderline"   Diverticulitis    Dysrhythmia    atrial fib - hx of    Edema    recent swelling in feet and ankles    GERD (gastroesophageal reflux disease)    history   Headache    Hemorrhoids    History of kidney stones    HTN (hypertension)    Hx of radiation therapy    for prostate - 2013    Hyperlipidemia LDL goal <70    IBS (irritable bowel syndrome)    Low back pain    Pre-diabetes    Prostate cancer ([place]) 2008   seed implantation  Sleep apnea    sitting in recliner helps him to breathe per pt , never used cpap sleep study - pt reports could not go to sleep- 6 years ago    Smoker    Stroke [facility])    hx of mini strokes years ago     Past Surgical History:  Procedure Laterality Date   APPENDECTOMY     CARDIAC CATHETERIZATION N/A 01/16/2016   Procedure: Left Heart Cath and Coronary Angiography;  Surgeon: Jettie Booze, MD;  Location: [place] [facility];  Service: Cardiovascular;  Laterality: N/A;   CARDIAC CATHETERIZATION N/A 01/16/2016   Procedure: Coronary Stent Intervention;  Surgeon: Jettie Booze, MD;  Location: [place] [facility];  Service: Cardiovascular;  Laterality: N/A;   CARDIOVERSION N/A 11/26/2017   Procedure: CARDIOVERSION;  Surgeon: [name], MD;  Location: [facility] ENDOSCOPY;  Service: Cardiovascular;  Laterality: N/A;   CARDIOVERSION N/A 03/13/2018   Procedure: CARDIOVERSION;  Surgeon: [name], MD;  Location: [facility] ENDOSCOPY;  Service: Cardiovascular;  Laterality: N/A;   CATARACT EXTRACTION W/ INTRAOCULAR LENS  IMPLANT, BILATERAL Bilateral    COLONOSCOPY  10/21/2008   diverticulosis, radiation proctitis, internal  hemorrhoids   CORONARY ANGIOPLASTY     CORONARY STENT INTERVENTION N/A 12/26/2017   Procedure: CORONARY STENT INTERVENTION;  Surgeon: [name], MD;  Location: [place] [facility];  Service: Cardiovascular;  Laterality: N/A;   ENDARTERECTOMY  11/12/2011   Procedure: ENDARTERECTOMY CAROTID;  Surgeon: Rosetta Posner, MD;  Location: [place];  Service: Vascular;  Laterality: Right;   ESOPHAGOGASTRODUODENOSCOPY (EGD) WITH ESOPHAGEAL DILATION     HEMORRHOID BANDING     LEFT HEART CATH AND CORONARY ANGIOGRAPHY N/A 12/26/2017   Procedure: LEFT HEART CATH AND CORONARY ANGIOGRAPHY;  Surgeon: [name], MD;  Location: [place] [facility];  Service: Cardiovascular;  Laterality: N/A;   LITHOTRIPSY  X 3   LUMBAR LAMINECTOMY     TRANSURETHRAL RESECTION OF BLADDER TUMOR WITH MITOMYCIN-C Bilateral 01/06/2021   Procedure: TRANSURETHRAL RESECTION OF BLADDER TUMOR WITH GEMCITABINE BILATERAL RETROGRADES;  Surgeon: Irine Seal, MD;  Location: [facility];  Service: Urology;  Laterality: Bilateral;   TRANSURETHRAL RESECTION OF PROSTATE  10/2003   TRANSURETHRAL RESECTION OF PROSTATE N/A 05/31/2020   Procedure: CYSTOSCOPY TRANSURETHRAL RESECTION BIOPSY AND FULGURATION OF PROSTATE LESION  (TURP);  Surgeon: Irine Seal, MD;  Location: [facility];  Service: Urology;  Laterality: N/A;    Social History   Socioeconomic History   Marital status: Married    Spouse name: Not on file   Number of children: 2   Years of education: Not on file   Highest education level: Not on file  Occupational History   Occupation: [employer]  Tobacco Use   Smoking status: Former    Packs/day: 1.50    Years: 60.00    Pack years: 90.00    Types: Cigarettes    Quit date: 07/25/2008    Years since quitting: 12.8   Smokeless tobacco: Never  Vaping Use   Vaping Use: Never used  Substance and Sexual Activity   Alcohol use: Not Currently    Comment: occassionally   Drug use: No   Sexual activity: Not Currently  Other Topics  Concern   Not on file  Social History Narrative   Married second time   Retired [employer] yrs   2 daughters and 2 stepdaughters   Social Determinants of Health   Financial Resource Strain: Low Risk  Difficulty of Paying Living Expenses: Not hard at all  Food Insecurity: Not on file  Transportation Needs: No Transportation Needs   Lack of Transportation (Medical): No   Lack of Transportation (Non-Medical): No  Physical Activity: Sufficiently Active   Days of Exercise per Week: 5 days   Minutes of Exercise per Session: 30 min  Stress: No Stress Concern Present   Feeling of Stress : Not at all  Social Connections: Socially Integrated   Frequency of Communication with Friends and Family: More than three times a week   Frequency of Social Gatherings with Friends and Family: More than three times a week   Attends Religious Services: More than 4 times per year   Active Member of [employer] or Organizations: Yes   Attends Music therapist: More than 4 times per year   Marital Status: Married  Human resources officer Violence: Not on file    Family History  Problem Relation Age of Onset   Heart disease Father    Hypertension Mother    Asthma Paternal Grandmother    Colon cancer Neg Hx    Esophageal cancer Neg Hx    Stomach cancer Neg Hx    Rectal cancer Neg Hx    Liver cancer Neg Hx      Review of Systems  Constitutional: Negative.  Negative for chills and fever.  HENT:  Positive for congestion. Negative for sore throat.   Respiratory:  Positive for cough and sputum production. Negative for shortness of breath.   Cardiovascular: Negative.  Negative for chest pain and palpitations.  Gastrointestinal: Negative.  Negative for abdominal pain, diarrhea, nausea and vomiting.  Genitourinary: Negative.  Negative for dysuria and hematuria.  Skin: Negative.  Negative for rash.  Neurological: Negative.  Negative for dizziness and headaches.  All other systems  reviewed and are negative.  Today's Vitals   05/11/21 1504  BP: (!) 144/88  Temp: 98.4 F (36.9 C)  TempSrc: Oral  SpO2: 98%  Weight: 179 lb (81.2 kg)  Height: 5\' 9"  (1.753 m)   Body mass index is 26.43 kg/m.  Physical Exam Vitals reviewed.  Constitutional:      Appearance: Normal appearance.  HENT:     Head: Normocephalic.     Mouth/Throat:     Mouth: Mucous membranes are moist.     Pharynx: Oropharynx is clear.  Eyes:     Extraocular Movements: Extraocular movements intact.     Pupils: Pupils are equal, round, and reactive to light.  Cardiovascular:     Rate and Rhythm: Normal rate and regular rhythm.     Heart sounds: Normal heart sounds.  Pulmonary:     Breath sounds: Wheezing (Mild diffuse expiratory) and rales (Right base) present.  Musculoskeletal:     Cervical back: Neck supple.  Lymphadenopathy:     Cervical: No cervical adenopathy.  Skin:    General: Skin is warm and dry.     Capillary Refill: Capillary refill takes less than 2 seconds.  Neurological:     General: No focal deficit present.     Mental Status: He is alert and oriented to person, place, and time.  Psychiatric:        Mood and Affect: Mood normal.        Behavior: Behavior normal.     ASSESSMENT & PLAN: Problem List Items Addressed This Visit   None Visit Diagnoses     Acute asthmatic bronchitis    -  Primary   Relevant Medications   predniSONE (  DELTASONE) 20 MG tablet   albuterol (VENTOLIN HFA) 108 (90 Base) MCG/ACT inhaler   amoxicillin-clavulanate (AUGMENTIN) 875-125 MG tablet      Clinically stable.  No red flag signs or symptoms.  Afebrile.  Stable vital signs. Not tachypneic and oxygenating well. Has bronchospasm on physical examination.  Patient states he responds well to prednisone and inhalers. May benefit from antibiotic. Advised to stay well-hydrated and rest. Contact the office if no better or worse during the next several days  Patient Instructions  Acute  Bronchitis, Adult Acute bronchitis is when air tubes in the lungs (bronchi) suddenly get swollen. The condition can make it hard for you to breathe. In adults, acute bronchitis usually goes away within 2 weeks. A cough caused by bronchitis may last up to 3 weeks. Smoking, allergies, and asthma can make the condition worse. What are the causes? Germs that cause cold and flu (viruses). The most common cause of this condition is the virus that causes the common cold. Bacteria. Substances that bother (irritate) the lungs, including: Smoke from cigarettes and other types of tobacco. Dust and pollen. Fumes from chemicals, gases, or burned fuel. Indoor or outdoor air pollution. What increases the risk? A weak body's defense system. This is also called the immune system. Any condition that affects your lungs and breathing, such as asthma. What are the signs or symptoms? A cough. Coughing up clear, yellow, or green mucus. Making high-pitched whistling sounds when you breathe, most often when you breathe out (wheezing). Runny or stuffy nose. Having too much mucus in your lungs (chest congestion). Shortness of breath. Body aches. A sore throat. How is this treated? Acute bronchitis may go away over time without treatment. Your doctor may tell you to: Drink more fluids. This will help thin your mucus so it is easier to cough up. Use a device that gets medicine into your lungs (inhaler). Use a vaporizer or a humidifier. These are machines that add water to the air. This helps with coughing and poor breathing. Take a medicine that thins mucus and helps clear it from your lungs. Take a medicine that prevents or stops coughing. It is not common to take an antibiotic medicine for this condition. Follow these instructions at home:  Take over-the-counter and prescription medicines only as told by your doctor. Use an inhaler, vaporizer, or humidifier as told by your doctor. Take two teaspoons (10 mL)  of honey at bedtime. This helps lessen your coughing at night. Drink enough fluid to keep your pee (urine) pale yellow. Do not smoke or use any products that contain nicotine or tobacco. If you need help quitting, ask your doctor. Get a lot of rest. Return to your normal activities when your doctor says that it is safe. Keep all follow-up visits. How is this prevented?  Wash your hands often with soap and water for at least 20 seconds. If you cannot use soap and water, use hand sanitizer. Avoid contact with people who have cold symptoms. Try not to touch your mouth, nose, or eyes with your hands. Avoid breathing in smoke or chemical fumes. Make sure to get the flu shot every year. Contact a doctor if: Your symptoms do not get better in 2 weeks. You have trouble coughing up the mucus. Your cough keeps you awake at night. You have a fever. Get help right away if: You cough up blood. You have chest pain. You have very bad shortness of breath. You faint or keep feeling like you are going  to faint. You have a very bad headache. Your fever or chills get worse. These symptoms may be an emergency. Get help right away. Call your local emergency services (911 in the [place] Do not wait to see if the symptoms will go away. Do not drive yourself to the hospital. Summary Acute bronchitis is when air tubes in the lungs (bronchi) suddenly get swollen. In adults, acute bronchitis usually goes away within 2 weeks. Drink more fluids. This will help thin your mucus so it is easier to cough up. Take over-the-counter and prescription medicines only as told by your doctor. Contact a doctor if your symptoms do not improve after 2 weeks of treatment. This information is not intended to replace advice given to you by your health care provider. Make sure you discuss any questions you have with your health care provider. Document Revised: 09/14/2020 Document Reviewed: 09/14/2020 Elsevier Patient Education   2022 [place], MD [facility]

## 2021-05-12 DIAGNOSIS — L308 Other specified dermatitis: Secondary | ICD-10-CM | POA: Diagnosis not present

## 2021-05-12 DIAGNOSIS — L820 Inflamed seborrheic keratosis: Secondary | ICD-10-CM | POA: Diagnosis not present

## 2021-05-30 ENCOUNTER — Ambulatory Visit: Payer: HMO | Admitting: Cardiovascular Disease

## 2021-05-31 ENCOUNTER — Ambulatory Visit: Payer: HMO | Admitting: Internal Medicine

## 2021-05-31 ENCOUNTER — Encounter: Payer: Self-pay | Admitting: Internal Medicine

## 2021-05-31 ENCOUNTER — Telehealth (INDEPENDENT_AMBULATORY_CARE_PROVIDER_SITE_OTHER): Payer: HMO | Admitting: Internal Medicine

## 2021-05-31 DIAGNOSIS — J45909 Unspecified asthma, uncomplicated: Secondary | ICD-10-CM

## 2021-05-31 DIAGNOSIS — I10 Essential (primary) hypertension: Secondary | ICD-10-CM

## 2021-05-31 DIAGNOSIS — U071 COVID-19: Secondary | ICD-10-CM | POA: Diagnosis not present

## 2021-05-31 DIAGNOSIS — E119 Type 2 diabetes mellitus without complications: Secondary | ICD-10-CM | POA: Diagnosis not present

## 2021-05-31 MED ORDER — ALBUTEROL SULFATE HFA 108 (90 BASE) MCG/ACT IN AERS: 2.0000 | INHALATION_SPRAY | Freq: Four times a day (QID) | RESPIRATORY_TRACT | 3 refills | Status: DC | PRN

## 2021-05-31 MED ORDER — MOLNUPIRAVIR EUA 200MG CAPSULE: 4.0000 | ORAL_CAPSULE | Freq: Two times a day (BID) | ORAL | 0 refills | Status: AC

## 2021-05-31 MED ORDER — DIPHENOXYLATE-ATROPINE 2.5-0.025 MG PO TABS: 1.0000 | ORAL_TABLET | Freq: Four times a day (QID) | ORAL | 0 refills | Status: DC | PRN

## 2021-05-31 NOTE — Assessment & Plan Note (Signed)
BP Readings from Last 3 Encounters:  05/11/21 (!) 144/88  03/28/21 114/61  03/07/21 (!) 144/66   Recently elevated, pt reminded to follow BP at home on a regular basis, with goal to be < 140/90, pt to continue medical treatment  - coreg

## 2021-05-31 NOTE — Patient Instructions (Signed)
Please take all new medication as prescribed 

## 2021-05-31 NOTE — Assessment & Plan Note (Signed)
New diagnosis x 2 days symptoms in higher risk patient due to age and comorbids; ok for molnupiravir course, inhaler prn, lomotil prn, and  to f/u any worsening symptoms or concerns

## 2021-05-31 NOTE — Assessment & Plan Note (Signed)
Lab Results  Component Value Date   HGBA1C 6.5 (H) 12/28/2020   Stable, pt to continue current medical treatment  - diet

## 2021-05-31 NOTE — Progress Notes (Signed)
Patient ID: Dillon Young, male   DOB: June 08, 1932, 88 y.o.   MRN: [id number]  Virtual Visit via Video Note  I connected with Dillon Young on 05/31/21 at 10:20 AM EST by a video enabled telemedicine application and verified that I am speaking with the correct person using two identifiers.  Location of all participants today Patient: at home with wife present Provider: at office   I discussed the limitations of evaluation and management by telemedicine and the availability of in person appointments. The patient expressed understanding and agreed to proceed.  History of Present Illness: Here after found unfortunately COVID + by home testing x 2 days ago.  Symptoms include moderated scant prod cough, sob, HA, diarrehea, loss of taste but no n/v or loss of smell.  Pt is s/p covid vax x 2 + booster x 2  Denies worsening reflux, abd pain, dysphagia, n/v, bowel change or blood.  Pt denies other chest pain, wheezing, orthopnea, PND, increased LE swelling, palpitations, dizziness or syncope.   Pt denies polydipsia, polyuria,  no prior hx of covid infection. No other sick contacts or obvious exposure Past Medical History:  Diagnosis Date   Adenomatous colon polyp    Allergic rhinitis    Anemia    Anxiety    Bladder cancer (HCC)    BPH (benign prostatic hyperplasia)    CAD (coronary artery disease)    sees Dr. [name]    Carotid artery disease [facility])    CHF (congestive heart failure) ([place])    Chronic kidney disease    Sones. Prostate cancer   Clotting disorder (HCC)    CVA (cerebral infarction)    Diabetes mellitus    "Borderline"   Diverticulitis    Dysrhythmia    atrial fib - hx of    Edema    recent swelling in feet and ankles    GERD (gastroesophageal reflux disease)    history   Headache    Hemorrhoids    History of kidney stones    HTN (hypertension)    Hx of radiation therapy    for prostate - 2013    Hyperlipidemia LDL goal <70    IBS (irritable bowel syndrome)     Low back pain    Pre-diabetes    Prostate cancer ([place]) 2008   seed implantation   Sleep apnea    sitting in recliner helps him to breathe per pt , never used cpap sleep study - pt reports could not go to sleep- 6 years ago    Smoker    Stroke [facility])    hx of mini strokes years ago    Past Surgical History:  Procedure Laterality Date   APPENDECTOMY     CARDIAC CATHETERIZATION N/A 01/16/2016   Procedure: Left Heart Cath and Coronary Angiography;  Surgeon: Jettie Booze, MD;  Location: [place] CV LAB;  Service: Cardiovascular;  Laterality: N/A;   CARDIAC CATHETERIZATION N/A 01/16/2016   Procedure: Coronary Stent Intervention;  Surgeon: Jettie Booze, MD;  Location: [place] CV LAB;  Service: Cardiovascular;  Laterality: N/A;   CARDIOVERSION N/A 11/26/2017   Procedure: CARDIOVERSION;  Surgeon: [name], MD;  Location: [facility] ENDOSCOPY;  Service: Cardiovascular;  Laterality: N/A;   CARDIOVERSION N/A 03/13/2018   Procedure: CARDIOVERSION;  Surgeon: [name], MD;  Location: [facility] ENDOSCOPY;  Service: Cardiovascular;  Laterality: N/A;   CATARACT EXTRACTION W/ INTRAOCULAR LENS  IMPLANT, BILATERAL Bilateral    COLONOSCOPY  10/21/2008   diverticulosis, radiation  proctitis, internal hemorrhoids   CORONARY ANGIOPLASTY     CORONARY STENT INTERVENTION N/A 12/26/2017   Procedure: CORONARY STENT INTERVENTION;  Surgeon: [name], MD;  Location: [place] CV LAB;  Service: Cardiovascular;  Laterality: N/A;   ENDARTERECTOMY  11/12/2011   Procedure: ENDARTERECTOMY CAROTID;  Surgeon: Rosetta Posner, MD;  Location: [place];  Service: Vascular;  Laterality: Right;   ESOPHAGOGASTRODUODENOSCOPY (EGD) WITH ESOPHAGEAL DILATION     HEMORRHOID BANDING     LEFT HEART CATH AND CORONARY ANGIOGRAPHY N/A 12/26/2017   Procedure: LEFT HEART CATH AND CORONARY ANGIOGRAPHY;  Surgeon: [name], MD;  Location: [place] CV LAB;  Service: Cardiovascular;  Laterality: N/A;   LITHOTRIPSY  X  3   LUMBAR LAMINECTOMY     TRANSURETHRAL RESECTION OF BLADDER TUMOR WITH MITOMYCIN-C Bilateral 01/06/2021   Procedure: TRANSURETHRAL RESECTION OF BLADDER TUMOR WITH GEMCITABINE BILATERAL RETROGRADES;  Surgeon: Irine Seal, MD;  Location: [facility] OR[facility];  Service: Urology;  Laterality: Bilateral;   TRANSURETHRAL RESECTION OF PROSTATE  10/2003   TRANSURETHRAL RESECTION OF PROSTATE N/A 05/31/2020   Procedure: CYSTOSCOPY TRANSURETHRAL RESECTION BIOPSY AND FULGURATION OF PROSTATE LESION  (TURP);  Surgeon: Irine Seal, MD;  Location: [facility] ORS;  Service: Urology;  Laterality: N/A;    reports that he quit smoking about 12 years ago. His smoking use included cigarettes. He has a 90.00 pack-year smoking history. He has never used smokeless tobacco. He reports that he does not currently use alcohol. He reports that he does not use drugs. family history includes Asthma in his paternal grandmother; Heart disease in his father; Hypertension in his mother. Allergies  Allergen Reactions   Lipitor [Atorvastatin] Other (See Comments)    Muscle pain   Other Rash and Other (See Comments)    Detergent- Rashes on the skin   Current Outpatient Medications on File Prior to Visit  Medication Sig Dispense Refill   acetaminophen (TYLENOL) 500 MG tablet Take 500 mg by mouth every 6 (six) hours as needed for mild pain or headache.     apixaban (ELIQUIS) 5 MG TABS tablet Take 1 tablet (5 mg total) by mouth 2 (two) times daily. 180 tablet 1   carvedilol (COREG) 3.125 MG tablet Take 1 tablet (3.125 mg total) by mouth 2 (two) times daily with a meal. NEEDS APPOINTMENT FOR MORE REFILLS 60 tablet 3   carvedilol (COREG) 6.25 MG tablet TAKE 1 TABLET BY MOUTH 2 TIMES DAILY WITH A MEAL. (Patient taking differently: Take 9.375 mg by mouth in the morning and at bedtime.) 180 tablet 3   Cholecalciferol (VITAMIN D-3) 25 MCG (1000 UT) CAPS Take 1,000 Units by mouth daily.     dofetilide (TIKOSYN) 125 MCG capsule TAKE 1 CAPSULE BY MOUTH TWICE DAILY  180 capsule 3   Ferric Maltol (ACCRUFER) 30 MG CAPS Take 1 capsule by mouth in the morning and at bedtime. 180 capsule 1   fexofenadine (ALLEGRA) 180 MG tablet Take 180 mg by mouth daily as needed for allergies or rhinitis.      fluocinolone (VANOS) 0.01 % cream Apply topically 2 (two) times daily. (Patient taking differently: Apply 1 application topically 2 (two) times daily as needed (rash).) 30 g 0   fluticasone (FLONASE) 50 MCG/ACT nasal spray Place 2 sprays into both nostrils daily as needed for allergies or rhinitis. 48 g 1   hydrocortisone (ANUSOL-HC) 2.5 % rectal cream Place 1 application rectally 3 (three) times daily as needed for hemorrhoids. 30 g 1   Hypromellose (ARTIFICIAL TEARS  OP) Place 1 drop into both eyes 4 (four) times daily as needed (dry eyes).     ipratropium (ATROVENT) 0.06 % nasal spray PLACE 2 SPRAYS INTO BOTH NOSTRILS 4 (FOUR) TIMES DAILY AS NEEDED FOR RHINITIS. 90 mL 1   levocetirizine (XYZAL) 5 MG tablet Take 1 tablet (5 mg total) by mouth every evening. (Patient taking differently: Take 5 mg by mouth at bedtime as needed for allergies.) 90 tablet 1   nitroGLYCERIN (NITROSTAT) 0.4 MG SL tablet Place 1 tablet (0.4 mg total) under the tongue every 5 (five) minutes as needed for chest pain. 25 tablet 3   rosuvastatin (CRESTOR) 10 MG tablet Take 1 tablet (10 mg total) by mouth daily. 90 tablet 1   sacubitril-valsartan (ENTRESTO) 97-103 MG Take 1 tablet by mouth 2 (two) times daily. 180 tablet 1   sodium chloride (OCEAN) 0.65 % SOLN nasal spray Place 1-2 sprays into both nostrils as needed for congestion.     spironolactone (ALDACTONE) 25 MG tablet Take 2 tablets (50 mg total) by mouth daily. 180 tablet 3   traMADol (ULTRAM) 50 MG tablet Take 1 tablet (50 mg total) by mouth every 6 (six) hours as needed. 4 tablet 0   triamcinolone (NASACORT) 55 MCG/ACT AERO nasal inhaler Place 2 sprays into the nose at bedtime.     triamcinolone cream (KENALOG) 0.1 % Apply 1 application  topically daily as needed (to affected areas, for itching).      Wheat Dextrin (BENEFIBER DRINK MIX PO) Take 2 Scoops by mouth daily in the afternoon. Take 2 tablespoons daily     No current facility-administered medications on file prior to visit.    Observations/Objective: Alert, NAD, appropriate mood and affect, resps normal, cn 2-12 intact, moves all 4s, no visible rash or swelling Lab Results  Component Value Date   WBC 4.5 02/16/2021   HGB 12.7 (L) 02/16/2021   HCT 40.3 02/16/2021   PLT 136 (L) 02/16/2021   GLUCOSE 107 (H) 02/16/2021   CHOL 123 06/16/2020   TRIG 93 06/16/2020   HDL 51 06/16/2020   LDLCALC 53 06/16/2020   ALT 9 06/30/2020   AST 12 06/30/2020   NA 143 02/16/2021   K 4.4 02/16/2021   CL 103 02/16/2021   CREATININE 1.06 02/16/2021   BUN 20 02/16/2021   CO2 25 02/16/2021   TSH 2.28 03/07/2021   PSA 0.015 12/21/2020   INR 1.1 01/06/2021   HGBA1C 6.5 (H) 12/28/2020   MICROALBUR 5.7 01/06/2020   Assessment and Plan: See notes  Follow Up Instructions: See notes   I discussed the assessment and treatment plan with the patient. The patient was provided an opportunity to ask questions and all were answered. The patient agreed with the plan and demonstrated an understanding of the instructions.   The patient was advised to call back or seek an in-person evaluation if the symptoms worsen or if the condition fails to improve as anticipated.   Cathlean Cower, MD

## 2021-06-01 ENCOUNTER — Other Ambulatory Visit: Payer: Self-pay | Admitting: Internal Medicine

## 2021-06-01 ENCOUNTER — Encounter: Payer: Self-pay | Admitting: Internal Medicine

## 2021-06-07 ENCOUNTER — Ambulatory Visit (INDEPENDENT_AMBULATORY_CARE_PROVIDER_SITE_OTHER): Payer: HMO

## 2021-06-07 ENCOUNTER — Encounter: Payer: Self-pay | Admitting: Internal Medicine

## 2021-06-07 ENCOUNTER — Ambulatory Visit (INDEPENDENT_AMBULATORY_CARE_PROVIDER_SITE_OTHER): Payer: HMO | Admitting: Internal Medicine

## 2021-06-07 ENCOUNTER — Other Ambulatory Visit: Payer: Self-pay

## 2021-06-07 VITALS — BP 152/82 | HR 58 | Temp 98.2°F | Resp 16 | Ht 69.0 in | Wt 175.0 lb

## 2021-06-07 DIAGNOSIS — R053 Chronic cough: Secondary | ICD-10-CM | POA: Diagnosis not present

## 2021-06-07 DIAGNOSIS — D510 Vitamin B12 deficiency anemia due to intrinsic factor deficiency: Secondary | ICD-10-CM

## 2021-06-07 DIAGNOSIS — D696 Thrombocytopenia, unspecified: Secondary | ICD-10-CM | POA: Diagnosis not present

## 2021-06-07 DIAGNOSIS — D508 Other iron deficiency anemias: Secondary | ICD-10-CM

## 2021-06-07 DIAGNOSIS — J431 Panlobular emphysema: Secondary | ICD-10-CM

## 2021-06-07 DIAGNOSIS — J439 Emphysema, unspecified: Secondary | ICD-10-CM | POA: Diagnosis not present

## 2021-06-07 DIAGNOSIS — E119 Type 2 diabetes mellitus without complications: Secondary | ICD-10-CM

## 2021-06-07 DIAGNOSIS — Z0001 Encounter for general adult medical examination with abnormal findings: Secondary | ICD-10-CM

## 2021-06-07 DIAGNOSIS — R059 Cough, unspecified: Secondary | ICD-10-CM | POA: Diagnosis not present

## 2021-06-07 LAB — CBC WITH DIFFERENTIAL/PLATELET
Basophils Absolute: 0 10*3/uL (ref 0.0–0.1)
Basophils Relative: 0.7 % (ref 0.0–3.0)
Eosinophils Absolute: 0.1 10*3/uL (ref 0.0–0.7)
Eosinophils Relative: 1.8 % (ref 0.0–5.0)
HCT: 43.7 % (ref 39.0–52.0)
Hemoglobin: 14.2 g/dL (ref 13.0–17.0)
Lymphocytes Relative: 23.7 % (ref 12.0–46.0)
Lymphs Abs: 0.9 10*3/uL (ref 0.7–4.0)
MCHC: 32.5 g/dL (ref 30.0–36.0)
MCV: 83.9 fl (ref 78.0–100.0)
Monocytes Absolute: 0.4 10*3/uL (ref 0.1–1.0)
Monocytes Relative: 9.5 % (ref 3.0–12.0)
Neutro Abs: 2.5 10*3/uL (ref 1.4–7.7)
Neutrophils Relative %: 64.3 % (ref 43.0–77.0)
Platelets: 165 10*3/uL (ref 150.0–400.0)
RBC: 5.21 Mil/uL (ref 4.22–5.81)
RDW: 16.5 % — ABNORMAL HIGH (ref 11.5–15.5)
WBC: 3.9 10*3/uL — ABNORMAL LOW (ref 4.0–10.5)

## 2021-06-07 LAB — HEMOGLOBIN A1C: Hgb A1c MFr Bld: 6.5 % (ref 4.6–6.5)

## 2021-06-07 LAB — IBC + FERRITIN
Ferritin: 52.8 ng/mL (ref 22.0–322.0)
Iron: 140 ug/dL (ref 42–165)
Saturation Ratios: 42.7 % (ref 20.0–50.0)
TIBC: 327.6 ug/dL (ref 250.0–450.0)
Transferrin: 234 mg/dL (ref 212.0–360.0)

## 2021-06-07 MED ORDER — CYANOCOBALAMIN 1000 MCG/ML IJ SOLN
1000.0000 ug | Freq: Once | INTRAMUSCULAR | Status: AC
  Administered 2021-06-07: 1000 ug via INTRAMUSCULAR

## 2021-06-07 NOTE — Progress Notes (Signed)
Subjective:  Patient ID: Dillon Young, male    DOB: 05/04/1933  Age: 88 y.o. MRN: [id number]  CC: Cough and Anemia  This visit occurred during the SARS-CoV-2 public health emergency.  Safety protocols were in place, including screening questions prior to the visit, additional usage of staff PPE, and extensive cleaning of exam room while observing appropriate contact time as indicated for disinfecting solutions.    HPI [name] presents for f/up-  He has had a cough for 3 months.  The cough is productive of clear phlegm.  He denies wheezing, shortness of breath, chest pain, night sweats, or hemoptysis.  Outpatient Medications Prior to Visit  Medication Sig Dispense Refill   acetaminophen (TYLENOL) 500 MG tablet Take 500 mg by mouth every 6 (six) hours as needed for mild pain or headache.     albuterol (VENTOLIN HFA) 108 (90 Base) MCG/ACT inhaler Inhale 2 puffs into the lungs every 6 (six) hours as needed for wheezing or shortness of breath. 1 each 3   apixaban (ELIQUIS) 5 MG TABS tablet Take 1 tablet (5 mg total) by mouth 2 (two) times daily. 180 tablet 1   carvedilol (COREG) 3.125 MG tablet Take 1 tablet (3.125 mg total) by mouth 2 (two) times daily with a meal. NEEDS APPOINTMENT FOR MORE REFILLS 60 tablet 3   carvedilol (COREG) 6.25 MG tablet TAKE 1 TABLET BY MOUTH 2 TIMES DAILY WITH A MEAL. (Patient taking differently: Take 9.375 mg by mouth in the morning and at bedtime.) 180 tablet 3   Cholecalciferol (VITAMIN D-3) 25 MCG (1000 UT) CAPS Take 1,000 Units by mouth daily.     diphenoxylate-atropine (LOMOTIL) 2.5-0.025 MG tablet Take 1 tablet by mouth 4 (four) times daily as needed for diarrhea or loose stools. 30 tablet 0   dofetilide (TIKOSYN) 125 MCG capsule TAKE 1 CAPSULE BY MOUTH TWICE DAILY 180 capsule 3   Ferric Maltol (ACCRUFER) 30 MG CAPS Take 1 capsule by mouth in the morning and at bedtime. 180 capsule 1   fexofenadine (ALLEGRA) 180 MG tablet Take 180 mg by mouth daily  as needed for allergies or rhinitis.      fluocinolone (VANOS) 0.01 % cream Apply topically 2 (two) times daily. (Patient taking differently: Apply 1 application topically 2 (two) times daily as needed (rash).) 30 g 0   fluticasone (FLONASE) 50 MCG/ACT nasal spray Place 2 sprays into both nostrils daily as needed for allergies or rhinitis. 48 g 1   hydrocortisone (ANUSOL-HC) 2.5 % rectal cream Place 1 application rectally 3 (three) times daily as needed for hemorrhoids. 30 g 1   Hypromellose (ARTIFICIAL TEARS OP) Place 1 drop into both eyes 4 (four) times daily as needed (dry eyes).     ipratropium (ATROVENT) 0.06 % nasal spray PLACE 2 SPRAYS INTO BOTH NOSTRILS 4 (FOUR) TIMES DAILY AS NEEDED FOR RHINITIS. 90 mL 1   levocetirizine (XYZAL) 5 MG tablet Take 1 tablet (5 mg total) by mouth every evening. (Patient taking differently: Take 5 mg by mouth at bedtime as needed for allergies.) 90 tablet 1   nitroGLYCERIN (NITROSTAT) 0.4 MG SL tablet Place 1 tablet (0.4 mg total) under the tongue every 5 (five) minutes as needed for chest pain. 25 tablet 3   rosuvastatin (CRESTOR) 10 MG tablet Take 1 tablet (10 mg total) by mouth daily. 90 tablet 1   sacubitril-valsartan (ENTRESTO) 97-103 MG Take 1 tablet by mouth 2 (two) times daily. 180 tablet 1   sodium chloride (OCEAN) 0.65 %  SOLN nasal spray Place 1-2 sprays into both nostrils as needed for congestion.     spironolactone (ALDACTONE) 25 MG tablet Take 2 tablets (50 mg total) by mouth daily. 180 tablet 3   traMADol (ULTRAM) 50 MG tablet Take 1 tablet (50 mg total) by mouth every 6 (six) hours as needed. 4 tablet 0   triamcinolone (NASACORT) 55 MCG/ACT AERO nasal inhaler Place 2 sprays into the nose at bedtime.     triamcinolone cream (KENALOG) 0.1 % Apply 1 application topically daily as needed (to affected areas, for itching).      Wheat Dextrin (BENEFIBER DRINK MIX PO) Take 2 Scoops by mouth daily in the afternoon. Take 2 tablespoons daily     No  facility-administered medications prior to visit.    ROS Review of Systems  Constitutional:  Negative for chills, diaphoresis, fatigue and fever.  HENT: Negative.    Respiratory:  Positive for cough. Negative for chest tightness, shortness of breath and wheezing.   Cardiovascular:  Negative for chest pain, palpitations and leg swelling.  Gastrointestinal:  Negative for abdominal pain, blood in stool and nausea.  Genitourinary: Negative.   Musculoskeletal: Negative.   Skin: Negative.  Negative for color change.  Neurological: Negative.   Hematological: Negative.   Psychiatric/Behavioral: Negative.     Objective:  BP (!) 152/82 (BP Location: Right Arm, Patient Position: Sitting, Cuff Size: Large)    Pulse (!) 58    Temp 98.2 F (36.8 C) (Oral)    Resp 16    Ht 5\' 9"  (1.753 m)    Wt 175 lb (79.4 kg)    SpO2 97%    BMI 25.84 kg/m   BP Readings from Last 3 Encounters:  06/07/21 (!) 152/82  05/11/21 (!) 144/88  03/28/21 114/61    Wt Readings from Last 3 Encounters:  06/07/21 175 lb (79.4 kg)  05/11/21 179 lb (81.2 kg)  03/07/21 176 lb (79.8 kg)    Physical Exam Vitals reviewed.  Constitutional:      Appearance: Normal appearance. He is not ill-appearing.  HENT:     Nose: Nose normal.     Mouth/Throat:     Mouth: Mucous membranes are moist.  Eyes:     General: No scleral icterus.    Conjunctiva/sclera: Conjunctivae normal.  Cardiovascular:     Rate and Rhythm: Normal rate and regular rhythm.     Heart sounds: No murmur heard. Pulmonary:     Effort: Pulmonary effort is normal.     Breath sounds: No stridor. No wheezing, rhonchi or rales.  Abdominal:     General: Abdomen is flat.     Palpations: There is no mass.     Tenderness: There is no abdominal tenderness. There is no guarding.     Hernia: No hernia is present.  Musculoskeletal:        General: Normal range of motion.     Cervical back: Neck supple.     Right lower leg: No edema.     Left lower leg: No edema.   Lymphadenopathy:     Cervical: No cervical adenopathy.  Skin:    General: Skin is warm and dry.     Coloration: Skin is not pale.  Neurological:     General: No focal deficit present.     Mental Status: He is alert.    Lab Results  Component Value Date   WBC 3.9 (L) 06/07/2021   HGB 14.2 06/07/2021   HCT 43.7 06/07/2021   PLT 165.0 06/07/2021  GLUCOSE 107 (H) 02/16/2021   CHOL 123 06/16/2020   TRIG 93 06/16/2020   HDL 51 06/16/2020   LDLCALC 53 06/16/2020   ALT 9 06/30/2020   AST 12 06/30/2020   NA 143 02/16/2021   K 4.4 02/16/2021   CL 103 02/16/2021   CREATININE 1.06 02/16/2021   BUN 20 02/16/2021   CO2 25 02/16/2021   TSH 2.28 03/07/2021   PSA 0.015 12/21/2020   INR 1.1 01/06/2021   HGBA1C 6.5 06/07/2021   MICROALBUR 5.7 01/06/2020    DG C-Arm 1-60 Min-No Report  Result Date: 01/06/2021 Fluoroscopy was utilized by the requesting physician.  No radiographic interpretation.   DG Chest 2 View  Result Date: 06/07/2021 CLINICAL DATA:  88 year old male with cough for 3 months EXAM: CHEST - 2 VIEW COMPARISON:  03/07/2021 FINDINGS: Cardiomediastinal silhouette unchanged in size and contour. No evidence of central vascular congestion. No interlobular septal thickening. Stigmata of emphysema, with increased retrosternal airspace, flattened hemidiaphragms, increased AP diameter, and hyperinflation on the AP view. No pneumothorax or pleural effusion. Coarsened interstitial markings, with no confluent airspace disease. No acute displaced fracture. Degenerative changes of the spine. IMPRESSION: Emphysema and chronic lung changes without evidence of acute cardiopulmonary disease Electronically Signed   By: Corrie Mckusick D.O.   On: 06/07/2021 09:46     Assessment & Plan:   Maxine was seen today for cough and anemia.  Diagnoses and all orders for this visit:  Diabetes mellitus without complication ([place])- His blood sugar is adequately well controlled. -     Hemoglobin A1c;  Future -     Hemoglobin A1c  Thrombocytopenia (HCC)- His platelet count is normal now. -     CBC with Differential/Platelet; Future -     CBC with Differential/Platelet  Vitamin B12 deficiency anemia due to intrinsic factor deficiency -     cyanocobalamin ((VITAMIN B-12)) injection 1,000 mcg  Encounter for general adult medical examination with abnormal findings  Iron deficiency anemia secondary to inadequate dietary iron intake- His H&H and iron level are normal now. -     IBC + Ferritin; Future -     CBC with Differential/Platelet; Future -     CBC with Differential/Platelet -     IBC + Ferritin  Anemia, iron deficiency, inadequate dietary intake  Chronic cough- His chest x-ray is positive for emphysema.  He will see pulmonary. -     DG Chest 2 View; Future  Panlobular emphysema ([place]) -     Ambulatory referral to Pulmonology   I am having Princess Bruins. Dubberly "Bill" maintain his Hypromellose (ARTIFICIAL TEARS OP), triamcinolone cream, fexofenadine, acetaminophen, sodium chloride, Vitamin D-3, fluticasone, nitroGLYCERIN, Wheat Dextrin (BENEFIBER DRINK MIX PO), spironolactone, apixaban, sacubitril-valsartan, levocetirizine, carvedilol, fluocinolone, ipratropium, triamcinolone, traMADol, hydrocortisone, dofetilide, ACCRUFeR, rosuvastatin, carvedilol, albuterol, and diphenoxylate-atropine. We administered cyanocobalamin.  Meds ordered this encounter  Medications   cyanocobalamin ((VITAMIN B-12)) injection 1,000 mcg     Follow-up: Return in about 4 weeks (around 07/05/2021).  [name], MD

## 2021-06-07 NOTE — Patient Instructions (Signed)

## 2021-06-08 ENCOUNTER — Encounter: Payer: Self-pay | Admitting: Internal Medicine

## 2021-06-08 DIAGNOSIS — J431 Panlobular emphysema: Secondary | ICD-10-CM | POA: Insufficient documentation

## 2021-06-09 ENCOUNTER — Encounter: Payer: Self-pay | Admitting: Internal Medicine

## 2021-06-13 DIAGNOSIS — Z8551 Personal history of malignant neoplasm of bladder: Secondary | ICD-10-CM | POA: Diagnosis not present

## 2021-06-14 ENCOUNTER — Other Ambulatory Visit: Payer: Self-pay | Admitting: Internal Medicine

## 2021-06-14 DIAGNOSIS — I10 Essential (primary) hypertension: Secondary | ICD-10-CM

## 2021-06-14 DIAGNOSIS — I519 Heart disease, unspecified: Secondary | ICD-10-CM

## 2021-06-21 ENCOUNTER — Encounter: Payer: Self-pay | Admitting: Internal Medicine

## 2021-06-21 ENCOUNTER — Ambulatory Visit: Payer: HMO | Admitting: Cardiovascular Disease

## 2021-06-21 ENCOUNTER — Other Ambulatory Visit: Payer: Self-pay

## 2021-06-21 ENCOUNTER — Encounter: Payer: Self-pay | Admitting: Cardiovascular Disease

## 2021-06-21 DIAGNOSIS — I493 Ventricular premature depolarization: Secondary | ICD-10-CM

## 2021-06-21 DIAGNOSIS — E785 Hyperlipidemia, unspecified: Secondary | ICD-10-CM

## 2021-06-21 DIAGNOSIS — I4819 Other persistent atrial fibrillation: Secondary | ICD-10-CM

## 2021-06-21 DIAGNOSIS — I10 Essential (primary) hypertension: Secondary | ICD-10-CM

## 2021-06-21 DIAGNOSIS — I519 Heart disease, unspecified: Secondary | ICD-10-CM | POA: Diagnosis not present

## 2021-06-21 DIAGNOSIS — I2510 Atherosclerotic heart disease of native coronary artery without angina pectoris: Secondary | ICD-10-CM | POA: Diagnosis not present

## 2021-06-21 DIAGNOSIS — I6523 Occlusion and stenosis of bilateral carotid arteries: Secondary | ICD-10-CM

## 2021-06-21 NOTE — Patient Instructions (Signed)
Medication Instructions:  Your physician recommends that you continue on your current medications as directed. Please refer to the Current Medication list given to you today.  *If you need a refill on your cardiac medications before your next appointment, please call your pharmacy*   Testing/Procedures: Your physician has requested that you have a carotid duplex. This test is an ultrasound of the carotid arteries in your neck. It looks at blood flow through these arteries that supply the brain with blood. Allow one hour for this exam. There are no restrictions or special instructions. To be done in May. This procedure is done at [place].    Follow-Up: At [facility], you and your health needs are our priority.  As part of our continuing mission to provide you with exceptional heart care, we have created designated Provider Care Teams.  These Care Teams include your primary Cardiologist (physician) and Advanced Practice Providers (APPs -  Physician Assistants and Nurse Practitioners) who all work together to provide you with the care you need, when you need it.  We recommend signing up for the patient portal called "MyChart".  Sign up information is provided on this After Visit Summary.  MyChart is used to connect with patients for Virtual Visits (Telemedicine).  Patients are able to view lab/test results, encounter notes, upcoming appointments, etc.  Non-urgent messages can be sent to your provider as well.   To learn more about what you can do with MyChart, go to [email or web address].    Your next appointment:   12 month(s)  The format for your next appointment:   In Person  Provider:   [name], MD

## 2021-06-21 NOTE — Assessment & Plan Note (Signed)
History of essential hypertension with blood pressure measured today in the office of 146/70.  His blood pressure at home was systolic 678L.  He is on carvedilol, and Entresto.

## 2021-06-21 NOTE — Assessment & Plan Note (Signed)
History of persistent atrial fibrillation chemically converted to sinus rhythm after Tikosyn load maintaining sinus rhythm on Eliquis oral anticoagulation.

## 2021-06-21 NOTE — Assessment & Plan Note (Signed)
History of frequent PVCs with event monitoring showing 22% of his beats.

## 2021-06-21 NOTE — Assessment & Plan Note (Signed)
History of CAD status post angioplasty in 1994 and 1995.  He had RCA stenting by Dr. [name] 01/16/2016 with a 3.5 mm x 20 mm long drug-eluting stent.  I left performed cardiac catheterization on him 12/26/2017 revealing a patent RCA stent with high-grade mid LAD stenosis.  He he underwent PCI and drug-eluting stenting by myself.  He currently denies chest pain or shortness of breath.

## 2021-06-21 NOTE — Progress Notes (Signed)
06/21/2021 Dillon Young   06-Oct-1932  [id number]  Primary Physician [name], MD Primary Cardiologist: [name] MD Dillon Young, [name]  HPI:  Dillon Young is a 88 y.o.  married Caucasian male father of 2 children, grandfather to 2 grandchildren he was formally a patient of Dr. Delfino Lovett Lowella Young. I last  saw him in the office 06/07/2020.  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 He works out 5 days a week and has joined [employer]. He has a history of coronary artery disease status post angioplasty in 1994 and 1995  Of his circumflex coronary artery. He has not required cardiac catheterization since stress Myoview was performed in 2011 that was low risk and nonischemic. He has a history of hypertension and hyperlipidemia on medication. He had right carotid endarterectomy performed by Dr. [name] Early June 2013 followed by duplex ultrasound. He denies chest pain or shortness of breath. His major complaint is of cramping in his thighs which may be related to his statin drug. He was given a statin holiday which really did not impact his symptoms and therefore statin was restarted and follow-up lipid profile performed 07/05/14 revealed a total cholesterol 131, LDL 58 and HDL of 44. Recent carotid Dopplers revealed his endarterectomy site was widely patent with moderate left ICA stenosis and moderate left subclavian artery stenosis without symptoms. He developed symptoms in August of this year and saw [name]. A Myoview stress test performed 01/09/16 was notable for ischemia in the RCA distribution. Based on this he underwent cardiac catheterization by Dr. [name] 01/16/16 revealing a 90% segmental mid dominant RCA stenosis which was stented with a Synergy 3.5 mm x 20 mm long drug-eluting stent. He did have a very small ramus branch that was 90% stenosed and not suitable for intervention as well as a 70% mid LAD  lesion although he had no evidence of anterior ischemia on his stress test. His symptoms of chest pain have resolved.   Since I saw him this months ago he is remained stable.  He denies chest pain or shortness of breath.  Does have mild ankle edema.  He is in newly recognized atrial fibrillation today.  He does relate a recent episode of hematuria which lasted for 10 days thought to be related related to his prostate and remote prostate cancer status post radioactive seed implantation.  He did see Dr. [name] , his urologist for evaluation and stopped his aspirin briefly with resolution of his hematuria.  He has restarted dual anti-platelet therapy.   He  had a 2D echo that revealed decline in his LV function from 50 to 55% by his last 2D echo 2 years ago to 30 to 35%.  This may be related to his atrial fibrillation.  He feels weak and fatigued with some shortness of breath as well.   Because of relatively new onset chest pain he saw [name] in the office he began him on oral nitrate.  His pain improved but he was referred for diagnostic coronary angiography to define his anatomy.   I performed cardiac catheterization on him 12/26/2017 revealing a patent RCA stent with high-grade mid LAD lesion which underwent PCI and drug-eluting stenting.  He was also found at that time by a left cheek log to have an EF of 25 to 30%.  He was in A. fib.  He subsequently undergone pharmacologic cardioversion  with Tikosyn.  He is on optimal medical therapy for his LV dysfunction and has seen Dr. [name] in the advanced heart failure clinic as recently as 12/01/2018.   His ejection fraction increased to normal after cardioversion.  He was somewhat weak and bradycardic, and his carvedilol was discontinued by Dr. [name] recently resulting in improvement in his symptoms.  He denies chest pain or shortness of breath.  He was in sinus rhythm when he saw Dr. [name] 12/03/2018 and again 11/01/2019.   He and his wife walk 3-4  times a week.  His most recent 2D echo performed l 05/16/2020 revealed preserved EF in the 50 to 55% range.  Event monitor showed 22% PVCs but no A. fib.  Carotid Dopplers performed 10/15/2019 did show moderate left ICA stenosis which will be repeated..  Since I saw him a year ago he continues to do well.  He does like both contracted COVID approxi-3 weeks ago and are slowly recovering from this.  He still walks 3 miles a day 4 days a week with his wife.  He denies chest pain or shortness of breath.   Current Meds  Medication Sig   acetaminophen (TYLENOL) 500 MG tablet Take 500 mg by mouth every 6 (six) hours as needed for mild pain or headache.   albuterol (VENTOLIN HFA) 108 (90 Base) MCG/ACT inhaler Inhale 2 puffs into the lungs every 6 (six) hours as needed for wheezing or shortness of breath.   apixaban (ELIQUIS) 5 MG TABS tablet Take 1 tablet (5 mg total) by mouth 2 (two) times daily.   carvedilol (COREG) 3.125 MG tablet Take 1 tablet (3.125 mg total) by mouth 2 (two) times daily with a meal. NEEDS APPOINTMENT FOR MORE REFILLS   carvedilol (COREG) 6.25 MG tablet TAKE 1 TABLET BY MOUTH 2 TIMES DAILY WITH A MEAL. (Patient taking differently: Take 9.375 mg by mouth in the morning and at bedtime.)   Cholecalciferol (VITAMIN D-3) 25 MCG (1000 UT) CAPS Take 1,000 Units by mouth daily.   diphenoxylate-atropine (LOMOTIL) 2.5-0.025 MG tablet Take 1 tablet by mouth 4 (four) times daily as needed for diarrhea or loose stools.   dofetilide (TIKOSYN) 125 MCG capsule TAKE 1 CAPSULE BY MOUTH TWICE DAILY   Ferric Maltol (ACCRUFER) 30 MG CAPS Take 1 capsule by mouth in the morning and at bedtime.   fexofenadine (ALLEGRA) 180 MG tablet Take 180 mg by mouth daily as needed for allergies or rhinitis.    fluticasone (FLONASE) 50 MCG/ACT nasal spray Place 2 sprays into both nostrils daily as needed for allergies or rhinitis.   hydrocortisone (ANUSOL-HC) 2.5 % rectal cream Place 1 application rectally 3 (three) times  daily as needed for hemorrhoids.   Hypromellose (ARTIFICIAL TEARS OP) Place 1 drop into both eyes 4 (four) times daily as needed (dry eyes).   ipratropium (ATROVENT) 0.06 % nasal spray PLACE 2 SPRAYS INTO BOTH NOSTRILS 4 (FOUR) TIMES DAILY AS NEEDED FOR RHINITIS.   levocetirizine (XYZAL) 5 MG tablet Take 1 tablet (5 mg total) by mouth every evening. (Patient taking differently: Take 5 mg by mouth at bedtime as needed for allergies.)   nitroGLYCERIN (NITROSTAT) 0.4 MG SL tablet Place 1 tablet (0.4 mg total) under the tongue every 5 (five) minutes as needed for chest pain.   rosuvastatin (CRESTOR) 10 MG tablet Take 1 tablet (10 mg total) by mouth daily.   sacubitril-valsartan (ENTRESTO) 97-103 MG Take 1 tablet by mouth 2 (two) times daily.   sodium chloride (OCEAN) 0.65 %  SOLN nasal spray Place 1-2 sprays into both nostrils as needed for congestion.   spironolactone (ALDACTONE) 25 MG tablet Take 2 tablets (50 mg total) by mouth daily.   triamcinolone (NASACORT) 55 MCG/ACT AERO nasal inhaler Place 2 sprays into the nose at bedtime.   triamcinolone cream (KENALOG) 0.1 % Apply 1 application topically daily as needed (to affected areas, for itching).    Wheat Dextrin (BENEFIBER DRINK MIX PO) Take 2 Scoops by mouth daily in the afternoon. Take 2 tablespoons daily     Allergies  Allergen Reactions   Lipitor [Atorvastatin] Other (See Comments)    Muscle pain   Other Rash and Other (See Comments)    Detergent- Rashes on the skin    Social History   Socioeconomic History   Marital status: Married    Spouse name: Not on file   Number of children: 2   Years of education: Not on file   Highest education level: Not on file  Occupational History   Occupation: [employer]  Tobacco Use   Smoking status: Former    Packs/day: 1.50    Years: 60.00    Pack years: 90.00    Types: Cigarettes    Quit date: 07/25/2008    Years since quitting: 12.9   Smokeless tobacco: Never  Vaping Use    Vaping Use: Never used  Substance and Sexual Activity   Alcohol use: Not Currently    Comment: occassionally   Drug use: No   Sexual activity: Not Currently  Other Topics Concern   Not on file  Social History Narrative   Married second time   Retired [place] yrs   2 daughters and 2 stepdaughters   Social Determinants of Health   Financial Resource Strain: Low Risk    Difficulty of Paying Living Expenses: Not hard at all  Food Insecurity: Not on file  Transportation Needs: No Transportation Needs   Lack of Transportation (Medical): No   Lack of Transportation (Non-Medical): No  Physical Activity: Sufficiently Active   Days of Exercise per Week: 5 days   Minutes of Exercise per Session: 30 min  Stress: No Stress Concern Present   Feeling of Stress : Not at all  Social Connections: Socially Integrated   Frequency of Communication with Friends and Family: More than three times a week   Frequency of Social Gatherings with Friends and Family: More than three times a week   Attends Religious Services: More than 4 times per year   Active Member of [employer] or Organizations: Yes   Attends Music therapist: More than 4 times per year   Marital Status: Married  Human resources officer Violence: Not on file     Review of Systems: General: negative for chills, fever, night sweats or weight changes.  Cardiovascular: negative for chest pain, dyspnea on exertion, edema, orthopnea, palpitations, paroxysmal nocturnal dyspnea or shortness of breath Dermatological: negative for rash Respiratory: negative for cough or wheezing Urologic: negative for hematuria Abdominal: negative for nausea, vomiting, diarrhea, bright red blood per rectum, melena, or hematemesis Neurologic: negative for visual changes, syncope, or dizziness All other systems reviewed and are otherwise negative except as noted above.    Blood pressure (!) 146/70, pulse 65, height 5\' 9"  (1.753 m),  weight 177 lb (80.3 kg), SpO2 98 %.  General appearance: alert and no distress Neck: no adenopathy, no carotid bruit, no JVD, supple, symmetrical, trachea midline, and thyroid not enlarged, symmetric, no tenderness/mass/nodules Lungs: clear to  auscultation bilaterally Heart: regular rate and rhythm, S1, S2 normal, no murmur, click, rub or gallop Extremities: extremities normal, atraumatic, no cyanosis or edema Pulses: 2+ and symmetric Skin: Skin color, texture, turgor normal. No rashes or lesions Neurologic: Grossly normal  EKG sinus rhythm at 65 with frequent PVCs, low limb voltage and left axis deviation.  I personally reviewed this EKG.  ASSESSMENT AND PLAN:   Hyperlipidemia with target LDL less than 70 History of hyperlipidemia intolerant to statin therapy on Crestor 10 mg a day.  His most recent lipid profile performed 06/16/2020 revealed total cholesterol 123, LDL 53 and HDL 51  Essential hypertension History of essential hypertension with blood pressure measured today in the office of 146/70.  His blood pressure at home was systolic 034J.  He is on carvedilol, and Entresto.  PVC's (premature ventricular contractions) History of frequent PVCs with event monitoring showing 22% of his beats.  Carotid artery disease- s/p RCE June 2013 History of carotid artery disease status post right carotid endarterectomy performed by Dr. Donnetta Hutching June 2013.  His most recent carotid Doppler study performed 10/05/2020 revealed a widely patent endarterectomy site with moderate left ICA stenosis.  This will be repeated this coming May.  Persistent atrial fibrillation History of persistent atrial fibrillation chemically converted to sinus rhythm after Tikosyn load maintaining sinus rhythm on Eliquis oral anticoagulation.  Left ventricular dysfunction History of reduced LV function with an EF of 35% range which improved to normal by 2D echo 05/16/2020 after restoration of sinus rhythm.  He is on optimal  medical therapy.  CAD (coronary artery disease) History of CAD status post angioplasty in 1994 and 1995.  He had RCA stenting by Dr. [name] 01/16/2016 with a 3.5 mm x 20 mm long drug-eluting stent.  I left performed cardiac catheterization on him 12/26/2017 revealing a patent RCA stent with high-grade mid LAD stenosis.  He he underwent PCI and drug-eluting stenting by myself.  He currently denies chest pain or shortness of breath.     [name] MD FACP,FACC,FAHA, [facility] 06/21/2021 9:47 AM

## 2021-06-21 NOTE — Assessment & Plan Note (Signed)
History of hyperlipidemia intolerant to statin therapy on Crestor 10 mg a day.  His most recent lipid profile performed 06/16/2020 revealed total cholesterol 123, LDL 53 and HDL 51

## 2021-06-21 NOTE — Assessment & Plan Note (Signed)
History of reduced LV function with an EF of 35% range which improved to normal by 2D echo 05/16/2020 after restoration of sinus rhythm.  He is on optimal medical therapy.

## 2021-06-21 NOTE — Assessment & Plan Note (Signed)
History of carotid artery disease status post right carotid endarterectomy performed by Dr. Donnetta Hutching June 2013.  His most recent carotid Doppler study performed 10/05/2020 revealed a widely patent endarterectomy site with moderate left ICA stenosis.  This will be repeated this coming May.

## 2021-06-26 ENCOUNTER — Other Ambulatory Visit: Payer: Self-pay

## 2021-06-26 ENCOUNTER — Encounter: Payer: Self-pay | Admitting: Internal Medicine

## 2021-06-26 ENCOUNTER — Ambulatory Visit (INDEPENDENT_AMBULATORY_CARE_PROVIDER_SITE_OTHER): Payer: HMO | Admitting: Internal Medicine

## 2021-06-26 VITALS — BP 144/82 | HR 69 | Temp 97.6°F | Ht 69.0 in | Wt 176.0 lb

## 2021-06-26 DIAGNOSIS — J301 Allergic rhinitis due to pollen: Secondary | ICD-10-CM

## 2021-06-26 DIAGNOSIS — E119 Type 2 diabetes mellitus without complications: Secondary | ICD-10-CM

## 2021-06-26 DIAGNOSIS — J310 Chronic rhinitis: Secondary | ICD-10-CM

## 2021-06-26 DIAGNOSIS — I10 Essential (primary) hypertension: Secondary | ICD-10-CM | POA: Diagnosis not present

## 2021-06-26 MED ORDER — LEVOCETIRIZINE DIHYDROCHLORIDE 5 MG PO TABS: 5.0000 mg | ORAL_TABLET | Freq: Every evening | ORAL | 1 refills | Status: DC

## 2021-06-26 MED ORDER — IPRATROPIUM BROMIDE 0.06 % NA SOLN: 2.0000 | Freq: Four times a day (QID) | NASAL | 1 refills | Status: DC | PRN

## 2021-06-26 NOTE — Patient Instructions (Signed)
Anemia °Anemia is a condition in which there is not enough red blood cells or hemoglobin in the blood. Hemoglobin is a substance in red blood cells that carries oxygen. °When you do not have enough red blood cells or hemoglobin (are anemic), your body cannot get enough oxygen and your organs may not work properly. As a result, you may feel very tired or have other problems. °What are the causes? °Common causes of anemia include: °Excessive bleeding. Anemia can be caused by excessive bleeding inside or outside the body, including bleeding from the intestines or from heavy menstrual periods in females. °Poor nutrition. °Long-lasting (chronic) kidney, thyroid, and liver disease. °Bone marrow disorders, spleen problems, and blood disorders. °Cancer and treatments for cancer. °HIV (human immunodeficiency virus) and AIDS (acquired immunodeficiency syndrome). °Infections, medicines, and autoimmune disorders that destroy red blood cells. °What are the signs or symptoms? °Symptoms of this condition include: °Minor weakness. °Dizziness. °Headache, or difficulties concentrating and sleeping. °Heartbeats that feel irregular or faster than normal (palpitations). °Shortness of breath, especially with exercise. °Pale skin, lips, and nails, or cold hands and feet. °Indigestion and nausea. °Symptoms may occur suddenly or develop slowly. If your anemia is mild, you may not have symptoms. °How is this diagnosed? °This condition is diagnosed based on blood tests, your medical history, and a physical exam. In some cases, a test may be needed in which cells are removed from the soft tissue inside of a bone and looked at under a microscope (bone marrow biopsy). Your health care provider may also check your stool (feces) for blood and may do additional testing to look for the cause of your bleeding. °Other tests may include: °Imaging tests, such as a CT scan or MRI. °A procedure to see inside your esophagus and stomach (endoscopy). °A  procedure to see inside your colon and rectum (colonoscopy). °How is this treated? °Treatment for this condition depends on the cause. If you continue to lose a lot of blood, you may need to be treated at a hospital. Treatment may include: °Taking supplements of iron, vitamin B12, or folic acid. °Taking a hormone medicine (erythropoietin) that can help to stimulate red blood cell growth. °Having a blood transfusion. This may be needed if you lose a lot of blood. °Making changes to your diet. °Having surgery to remove your spleen. °Follow these instructions at home: °Take over-the-counter and prescription medicines only as told by your health care provider. °Take supplements only as told by your health care provider. °Follow any diet instructions that you were given by your health care provider. °Keep all follow-up visits as told by your health care provider. This is important. °Contact a health care provider if: °You develop new bleeding anywhere in the body. °Get help right away if: °You are very weak. °You are short of breath. °You have pain in your abdomen or chest. °You are dizzy or feel faint. °You have trouble concentrating. °You have bloody stools, black stools, or tarry stools. °You vomit repeatedly or you vomit up blood. °These symptoms may represent a serious problem that is an emergency. Do not wait to see if the symptoms will go away. Get medical help right away. Call your local emergency services (911 in the [place] Do not drive yourself to the hospital. °Summary °Anemia is a condition in which you do not have enough red blood cells or enough of a substance in your red blood cells that carries oxygen (hemoglobin). °Symptoms may occur suddenly or develop slowly. °If your anemia is   mild, you may not have symptoms. °This condition is diagnosed with blood tests, a medical history, and a physical exam. Other tests may be needed. °Treatment for this condition depends on the cause of the anemia. °This  information is not intended to replace advice given to you by your health care provider. Make sure you discuss any questions you have with your health care provider. °Document Revised: 04/21/2019 Document Reviewed: 04/21/2019 °Elsevier Patient Education © 2022 [employer] ° °

## 2021-06-26 NOTE — Progress Notes (Signed)
Subjective:  Patient ID: Dillon Young, male    DOB: October 29, 1932  Age: 88 y.o. MRN: [id number]  CC: Hypertension, Diabetes, and Congestive Heart Failure  This visit occurred during the SARS-CoV-2 public health emergency.  Safety protocols were in place, including screening questions prior to the visit, additional usage of staff PPE, and extensive cleaning of exam room while observing appropriate contact time as indicated for disinfecting solutions.    HPI LOPEZ DENTINGER presents for f/up - He continues to complain of runny nose.  He is active and denies chest pain, shortness of breath, palpitations, diaphoresis, dizziness, or edema.  Outpatient Medications Prior to Visit  Medication Sig Dispense Refill   acetaminophen (TYLENOL) 500 MG tablet Take 500 mg by mouth every 6 (six) hours as needed for mild pain or headache.     albuterol (VENTOLIN HFA) 108 (90 Base) MCG/ACT inhaler Inhale 2 puffs into the lungs every 6 (six) hours as needed for wheezing or shortness of breath. 1 each 3   apixaban (ELIQUIS) 5 MG TABS tablet Take 1 tablet (5 mg total) by mouth 2 (two) times daily. 180 tablet 1   carvedilol (COREG) 3.125 MG tablet Take 1 tablet (3.125 mg total) by mouth 2 (two) times daily with a meal. NEEDS APPOINTMENT FOR MORE REFILLS 60 tablet 3   carvedilol (COREG) 6.25 MG tablet TAKE 1 TABLET BY MOUTH 2 TIMES DAILY WITH A MEAL. (Patient taking differently: Take 9.375 mg by mouth in the morning and at bedtime.) 180 tablet 3   Cholecalciferol (VITAMIN D-3) 25 MCG (1000 UT) CAPS Take 1,000 Units by mouth daily.     diphenoxylate-atropine (LOMOTIL) 2.5-0.025 MG tablet Take 1 tablet by mouth 4 (four) times daily as needed for diarrhea or loose stools. 30 tablet 0   dofetilide (TIKOSYN) 125 MCG capsule TAKE 1 CAPSULE BY MOUTH TWICE DAILY 180 capsule 3   Ferric Maltol (ACCRUFER) 30 MG CAPS Take 1 capsule by mouth in the morning and at bedtime. 180 capsule 1   fexofenadine (ALLEGRA) 180 MG tablet Take  180 mg by mouth daily as needed for allergies or rhinitis.      hydrocortisone (ANUSOL-HC) 2.5 % rectal cream Place 1 application rectally 3 (three) times daily as needed for hemorrhoids. 30 g 1   Hypromellose (ARTIFICIAL TEARS OP) Place 1 drop into both eyes 4 (four) times daily as needed (dry eyes).     nitroGLYCERIN (NITROSTAT) 0.4 MG SL tablet Place 1 tablet (0.4 mg total) under the tongue every 5 (five) minutes as needed for chest pain. 25 tablet 3   rosuvastatin (CRESTOR) 10 MG tablet Take 1 tablet (10 mg total) by mouth daily. 90 tablet 1   sacubitril-valsartan (ENTRESTO) 97-103 MG Take 1 tablet by mouth 2 (two) times daily. 180 tablet 1   spironolactone (ALDACTONE) 25 MG tablet Take 2 tablets (50 mg total) by mouth daily. 180 tablet 3   triamcinolone cream (KENALOG) 0.1 % Apply 1 application topically daily as needed (to affected areas, for itching).      Wheat Dextrin (BENEFIBER DRINK MIX PO) Take 2 Scoops by mouth daily in the afternoon. Take 2 tablespoons daily     fluticasone (FLONASE) 50 MCG/ACT nasal spray Place 2 sprays into both nostrils daily as needed for allergies or rhinitis. 48 g 1   ipratropium (ATROVENT) 0.06 % nasal spray PLACE 2 SPRAYS INTO BOTH NOSTRILS 4 (FOUR) TIMES DAILY AS NEEDED FOR RHINITIS. 90 mL 1   levocetirizine (XYZAL) 5 MG tablet Take 1  tablet (5 mg total) by mouth every evening. (Patient taking differently: Take 5 mg by mouth at bedtime as needed for allergies.) 90 tablet 1   sodium chloride (OCEAN) 0.65 % SOLN nasal spray Place 1-2 sprays into both nostrils as needed for congestion.     triamcinolone (NASACORT) 55 MCG/ACT AERO nasal inhaler Place 2 sprays into the nose at bedtime.     No facility-administered medications prior to visit.    ROS Review of Systems  Constitutional:  Negative for chills, diaphoresis, fatigue and fever.  HENT:  Positive for postnasal drip and rhinorrhea. Negative for nosebleeds, sinus pressure and sore throat.   Respiratory:  Negative.  Negative for cough, choking, shortness of breath and stridor.   Cardiovascular:  Negative for chest pain, palpitations and leg swelling.  Gastrointestinal:  Negative for abdominal pain, constipation, diarrhea and nausea.  Endocrine: Negative.   Genitourinary: Negative.  Negative for difficulty urinating.  Musculoskeletal: Negative.  Negative for arthralgias.  Skin: Negative.   Neurological:  Negative for dizziness, weakness, light-headedness and headaches.  Hematological:  Negative for adenopathy. Does not bruise/bleed easily.  Psychiatric/Behavioral: Negative.     Objective:  BP (!) 144/82 (BP Location: Right Arm, Patient Position: Sitting, Cuff Size: Large)    Pulse 69    Temp 97.6 F (36.4 C) (Oral)    Ht 5\' 9"  (1.753 m)    Wt 176 lb (79.8 kg)    SpO2 98%    BMI 25.99 kg/m   BP Readings from Last 3 Encounters:  06/30/21 138/78  06/26/21 (!) 144/82  06/21/21 (!) 146/70    Wt Readings from Last 3 Encounters:  06/30/21 173 lb 6.4 oz (78.7 kg)  06/26/21 176 lb (79.8 kg)  06/21/21 177 lb (80.3 kg)    Physical Exam Vitals reviewed.  HENT:     Nose: Nose normal.  Eyes:     General: No scleral icterus.    Conjunctiva/sclera: Conjunctivae normal.  Cardiovascular:     Rate and Rhythm: Normal rate and regular rhythm.     Heart sounds: No murmur heard. Pulmonary:     Effort: Pulmonary effort is normal.     Breath sounds: No stridor. No wheezing, rhonchi or rales.  Abdominal:     General: Abdomen is flat.     Palpations: There is no mass.     Tenderness: There is no abdominal tenderness. There is no guarding.     Hernia: No hernia is present.  Musculoskeletal:        General: Normal range of motion.     Cervical back: Neck supple.     Right lower leg: No edema.  Lymphadenopathy:     Cervical: No cervical adenopathy.  Skin:    General: Skin is warm.  Neurological:     General: No focal deficit present.     Mental Status: He is alert.  Psychiatric:        Mood  and Affect: Mood normal.        Behavior: Behavior normal.    Lab Results  Component Value Date   WBC 3.9 (L) 06/07/2021   HGB 14.2 06/07/2021   HCT 43.7 06/07/2021   PLT 165.0 06/07/2021   GLUCOSE 107 (H) 02/16/2021   CHOL 123 06/16/2020   TRIG 93 06/16/2020   HDL 51 06/16/2020   LDLCALC 53 06/16/2020   ALT 9 06/30/2020   AST 12 06/30/2020   NA 143 02/16/2021   K 4.4 02/16/2021   CL 103 02/16/2021   CREATININE 1.06  02/16/2021   BUN 20 02/16/2021   CO2 25 02/16/2021   TSH 2.28 03/07/2021   PSA 0.015 12/21/2020   INR 1.1 01/06/2021   HGBA1C 6.5 06/07/2021   MICROALBUR 5.7 01/06/2020    DG C-Arm 1-60 Min-No Report  Result Date: 01/06/2021 Fluoroscopy was utilized by the requesting physician.  No radiographic interpretation.    Assessment & Plan:   Akhilesh was seen today for hypertension, diabetes and congestive heart failure.  Diagnoses and all orders for this visit:  Chronic rhinitis -     ipratropium (ATROVENT) 0.06 % nasal spray; Place 2 sprays into both nostrils 4 (four) times daily as needed for rhinitis. -     levocetirizine (XYZAL) 5 MG tablet; Take 1 tablet (5 mg total) by mouth every evening.  Seasonal allergic rhinitis due to pollen -     levocetirizine (XYZAL) 5 MG tablet; Take 1 tablet (5 mg total) by mouth every evening.  Diabetes mellitus without complication ([place])- His blood sugar is adequately well controlled.  Essential hypertension- His blood pressure is adequately well controlled.   I have discontinued Princess Bruins. [place] "Bill"'s sodium chloride, fluticasone, and triamcinolone. I am also having him maintain his Hypromellose (ARTIFICIAL TEARS OP), triamcinolone cream, fexofenadine, acetaminophen, Vitamin D-3, nitroGLYCERIN, Wheat Dextrin (BENEFIBER DRINK MIX PO), spironolactone, apixaban, sacubitril-valsartan, carvedilol, hydrocortisone, dofetilide, ACCRUFeR, rosuvastatin, carvedilol, albuterol, diphenoxylate-atropine, ipratropium, and  levocetirizine.  Meds ordered this encounter  Medications   ipratropium (ATROVENT) 0.06 % nasal spray    Sig: Place 2 sprays into both nostrils 4 (four) times daily as needed for rhinitis.    Dispense:  90 mL    Refill:  1   levocetirizine (XYZAL) 5 MG tablet    Sig: Take 1 tablet (5 mg total) by mouth every evening.    Dispense:  90 tablet    Refill:  1     Follow-up: Return in about 6 months (around 12/24/2021).  [name], MD

## 2021-06-28 DIAGNOSIS — Z5111 Encounter for antineoplastic chemotherapy: Secondary | ICD-10-CM | POA: Diagnosis not present

## 2021-06-28 DIAGNOSIS — C678 Malignant neoplasm of overlapping sites of bladder: Secondary | ICD-10-CM | POA: Diagnosis not present

## 2021-06-30 ENCOUNTER — Ambulatory Visit: Payer: HMO | Admitting: Pulmonary Disease

## 2021-06-30 ENCOUNTER — Encounter: Payer: Self-pay | Admitting: Pulmonary Disease

## 2021-06-30 ENCOUNTER — Other Ambulatory Visit: Payer: Self-pay

## 2021-06-30 VITALS — BP 138/78 | HR 61 | Ht 69.0 in | Wt 173.4 lb

## 2021-06-30 DIAGNOSIS — J439 Emphysema, unspecified: Secondary | ICD-10-CM | POA: Diagnosis not present

## 2021-06-30 MED ORDER — SPIRIVA RESPIMAT 2.5 MCG/ACT IN AERS: 2.0000 | INHALATION_SPRAY | Freq: Every day | RESPIRATORY_TRACT | 0 refills | Status: DC

## 2021-06-30 NOTE — Progress Notes (Addendum)
Patient seen in the office today and instructed on use of Spiriva 2.60mcg.  Patient expressed understanding and demonstrated technique.  Benetta Spar [facility] 06/30/2021

## 2021-06-30 NOTE — Patient Instructions (Addendum)
Try spiriva 2.61mcg 2 puffs daily over the next month. If you notice improvement in your breathing call us for prescription.  We will schedule you for pulmonary function tests in 4-6 weeks with follow up visit

## 2021-06-30 NOTE — Progress Notes (Signed)
Synopsis: Referred in February 2023 for emphysema by Dillon Calico, MD  Subjective:   PATIENT ID: Dillon Young GENDER: male DOB: 1932-12-05, MRN: [id number]  HPI  Chief Complaint  Patient presents with   Consult    Referred by PCP for possible emphysema. States his last CXR showed emphysema. Had COVID about 6 weeks ago. States his breathing has returned back to normal.    Dillon Young is an 88 year old male, former smoker with history of GERD, CHF, CAD, hypertension and sleep apnea who is referred to pulmonary clinic for emphysema.   Patient reports having COVID infection 3 weeks ago with significant cough, mucus production and shortness of breath.  He denied any wheezing or chest tightness.  The cough has since resolved since his acute illness and he reports some mucus production every couple of days.  He had a chest x-ray performed on 06/07/21 which showed findings of hyperinflation concerning for emphysema prompting referral to our clinic.  He denies any limitation to physical activity due to shortness of breath.  He walks 3 to 4 days/week, 30 minutes at a time without any issue.  He quit smoking in 2010.  He has a 56-year smoking history and smoked 1.5 packs/day.  He is retired and worked for [employer].  He denies any significant dust or chemical exposures.  Past Medical History:  Diagnosis Date   Adenomatous colon polyp    Allergic rhinitis    Anemia    Anxiety    Bladder cancer (HCC)    BPH (benign prostatic hyperplasia)    CAD (coronary artery disease)    sees Dr. Quay Young    Carotid artery disease [facility])    CHF (congestive heart failure) (Dillon Young)    Chronic kidney disease    Sones. Prostate cancer   Clotting disorder (HCC)    CVA (cerebral infarction)    Diabetes mellitus    "Borderline"   Diverticulitis    Dysrhythmia    atrial fib - hx of    Edema    recent swelling in feet and ankles    GERD (gastroesophageal reflux disease)    history    Headache    Hemorrhoids    History of kidney stones    HTN (hypertension)    Hx of radiation therapy    for prostate - 2013    Hyperlipidemia LDL goal <70    IBS (irritable bowel syndrome)    Low back pain    Pre-diabetes    Prostate cancer ([place]) 2008   seed implantation   Sleep apnea    sitting in recliner helps him to breathe per pt , never used cpap sleep study - pt reports could not go to sleep- 6 years ago    Smoker    Stroke [facility])    hx of mini strokes years ago      Family History  Problem Relation Age of Onset   Heart disease Father    Hypertension Mother    Asthma Paternal Grandmother    Colon cancer Neg Hx    Esophageal cancer Neg Hx    Stomach cancer Neg Hx    Rectal cancer Neg Hx    Liver cancer Neg Hx      Social History   Socioeconomic History   Marital status: Married    Spouse name: Not on file   Number of children: 2   Years of education: Not on file   Highest education level: Not on file  Occupational History   Occupation: Dillon Young Academic librarian auction  Tobacco Use   Smoking status: Former    Packs/day: 1.50    Years: 60.00    Pack years: 90.00    Types: Cigarettes    Quit date: 07/25/2008    Years since quitting: 12.9   Smokeless tobacco: Never  Vaping Use   Vaping Use: Never used  Substance and Sexual Activity   Alcohol use: Not Currently    Comment: occassionally   Drug use: No   Sexual activity: Not Currently  Other Topics Concern   Not on file  Social History Narrative   Married second time   Retired [place] yrs   2 daughters and 2 stepdaughters   Social Determinants of Radio broadcast assistant Strain: Low Risk    Difficulty of Paying Living Expenses: Not hard at all  Food Insecurity: Not on file  Transportation Needs: No Transportation Needs   Lack of Transportation (Medical): No   Lack of Transportation (Non-Medical): No  Physical Activity: Sufficiently Active   Days of Exercise per Week: 5 days    Minutes of Exercise per Session: 30 min  Stress: No Stress Concern Present   Feeling of Stress : Not at all  Social Connections: Socially Integrated   Frequency of Communication with Friends and Family: More than three times a week   Frequency of Social Gatherings with Friends and Family: More than three times a week   Attends Religious Services: More than 4 times per year   Active Member of [employer] or Organizations: Yes   Attends Music therapist: More than 4 times per year   Marital Status: Married  Human resources officer Violence: Not on file     Allergies  Allergen Reactions   Lipitor [Atorvastatin] Other (See Comments)    Muscle pain   Other Rash and Other (See Comments)    Detergent- Rashes on the skin     Outpatient Medications Prior to Visit  Medication Sig Dispense Refill   acetaminophen (TYLENOL) 500 MG tablet Take 500 mg by mouth every 6 (six) hours as needed for mild pain or headache.     albuterol (VENTOLIN HFA) 108 (90 Base) MCG/ACT inhaler Inhale 2 puffs into the lungs every 6 (six) hours as needed for wheezing or shortness of breath. 1 each 3   apixaban (ELIQUIS) 5 MG TABS tablet Take 1 tablet (5 mg total) by mouth 2 (two) times daily. 180 tablet 1   carvedilol (COREG) 3.125 MG tablet Take 1 tablet (3.125 mg total) by mouth 2 (two) times daily with a meal. NEEDS APPOINTMENT FOR MORE REFILLS 60 tablet 3   carvedilol (COREG) 6.25 MG tablet TAKE 1 TABLET BY MOUTH 2 TIMES DAILY WITH A MEAL. (Patient taking differently: Take 9.375 mg by mouth in the morning and at bedtime.) 180 tablet 3   Cholecalciferol (VITAMIN D-3) 25 MCG (1000 UT) CAPS Take 1,000 Units by mouth daily.     diphenoxylate-atropine (LOMOTIL) 2.5-0.025 MG tablet Take 1 tablet by mouth 4 (four) times daily as needed for diarrhea or loose stools. 30 tablet 0   dofetilide (TIKOSYN) 125 MCG capsule TAKE 1 CAPSULE BY MOUTH TWICE DAILY 180 capsule 3   Ferric Maltol (ACCRUFER) 30 MG CAPS Take 1 capsule by mouth  in the morning and at bedtime. 180 capsule 1   fexofenadine (ALLEGRA) 180 MG tablet Take 180 mg by mouth daily as needed for allergies or rhinitis.      hydrocortisone (ANUSOL-HC)  2.5 % rectal cream Place 1 application rectally 3 (three) times daily as needed for hemorrhoids. 30 g 1   Hypromellose (ARTIFICIAL TEARS OP) Place 1 drop into both eyes 4 (four) times daily as needed (dry eyes).     ipratropium (ATROVENT) 0.06 % nasal spray Place 2 sprays into both nostrils 4 (four) times daily as needed for rhinitis. 90 mL 1   levocetirizine (XYZAL) 5 MG tablet Take 1 tablet (5 mg total) by mouth every evening. 90 tablet 1   nitroGLYCERIN (NITROSTAT) 0.4 MG SL tablet Place 1 tablet (0.4 mg total) under the tongue every 5 (five) minutes as needed for chest pain. 25 tablet 3   rosuvastatin (CRESTOR) 10 MG tablet Take 1 tablet (10 mg total) by mouth daily. 90 tablet 1   sacubitril-valsartan (ENTRESTO) 97-103 MG Take 1 tablet by mouth 2 (two) times daily. 180 tablet 1   spironolactone (ALDACTONE) 25 MG tablet Take 2 tablets (50 mg total) by mouth daily. 180 tablet 3   triamcinolone cream (KENALOG) 0.1 % Apply 1 application topically daily as needed (to affected areas, for itching).      Wheat Dextrin (BENEFIBER DRINK MIX PO) Take 2 Scoops by mouth daily in the afternoon. Take 2 tablespoons daily     No facility-administered medications prior to visit.   Review of Systems  Constitutional:  Negative for chills, fever, malaise/fatigue and weight loss.  HENT:  Negative for congestion, sinus pain and sore throat.   Eyes: Negative.   Respiratory:  Negative for cough, hemoptysis, sputum production, shortness of breath and wheezing.   Cardiovascular:  Negative for chest pain, palpitations, orthopnea, claudication and leg swelling.  Gastrointestinal:  Negative for abdominal pain, heartburn, nausea and vomiting.  Genitourinary: Negative.   Musculoskeletal:  Negative for joint pain and myalgias.  Skin:  Negative  for rash.  Neurological:  Negative for weakness.  Endo/Heme/Allergies: Negative.   Psychiatric/Behavioral: Negative.     Objective:   Vitals:   06/30/21 1124  BP: 138/78  Pulse: 61  SpO2: 98%  Weight: 173 lb 6.4 oz (78.7 kg)  Height: 5\' 9"  (1.753 m)    Physical Exam Constitutional:      General: He is not in acute distress. HENT:     Head: Normocephalic and atraumatic.  Eyes:     Extraocular Movements: Extraocular movements intact.     Conjunctiva/sclera: Conjunctivae normal.     Pupils: Pupils are equal, round, and reactive to light.  Cardiovascular:     Rate and Rhythm: Normal rate and regular rhythm.     Pulses: Normal pulses.     Heart sounds: Normal heart sounds. No murmur heard. Pulmonary:     Effort: Pulmonary effort is normal.     Breath sounds: No wheezing, rhonchi or rales.  Abdominal:     General: Bowel sounds are normal.     Palpations: Abdomen is soft.  Musculoskeletal:     Right lower leg: No edema.     Left lower leg: No edema.  Lymphadenopathy:     Cervical: No cervical adenopathy.  Skin:    General: Skin is warm and dry.  Neurological:     General: No focal deficit present.     Mental Status: He is alert.  Psychiatric:        Mood and Affect: Mood normal.        Behavior: Behavior normal.        Thought Content: Thought content normal.        Judgment: Judgment  normal.   CBC    Component Value Date/Time   WBC 3.9 (L) 06/07/2021 0914   RBC 5.21 06/07/2021 0914   HGB 14.2 06/07/2021 0914   HGB 12.7 (L) 02/16/2021 0943   HCT 43.7 06/07/2021 0914   HCT 40.3 02/16/2021 0943   PLT 165.0 06/07/2021 0914   PLT 136 (L) 02/16/2021 0943   MCV 83.9 06/07/2021 0914   MCV 83 02/16/2021 0943   MCH 26.1 (L) 02/16/2021 0943   MCH 26.6 12/28/2020 1326   MCHC 32.5 06/07/2021 0914   RDW 16.5 (H) 06/07/2021 0914   RDW 14.2 02/16/2021 0943   LYMPHSABS 0.9 06/07/2021 0914   LYMPHSABS 1.0 12/18/2017 1049   MONOABS 0.4 06/07/2021 0914   EOSABS 0.1  06/07/2021 0914   EOSABS 0.1 12/18/2017 1049   BASOSABS 0.0 06/07/2021 0914   BASOSABS 0.0 12/18/2017 1049   BMP Latest Ref Rng & Units 02/16/2021 12/28/2020 09/30/2020  Glucose 65 - 99 mg/dL 107(H) 100(H) 141(H)  BUN 8 - 27 mg/dL 20 23 20   Creatinine 0.76 - 1.27 mg/dL 1.06 1.00 1.16  BUN/Creat Ratio 10 - 24 19 - -  Sodium 134 - 144 mmol/L 143 142 140  Potassium 3.5 - 5.2 mmol/L 4.4 4.7 4.2  Chloride 96 - 106 mmol/L 103 102 106  CO2 20 - 29 mmol/L 25 28 27   Calcium 8.6 - 10.2 mg/dL 9.3 9.4 9.0   Chest imaging: CXR 06/07/21 Cardiomediastinal silhouette unchanged in size and contour. No evidence of central vascular congestion. No interlobular septal thickening.   Stigmata of emphysema, with increased retrosternal airspace, flattened hemidiaphragms, increased AP diameter, and hyperinflation on the AP view.  PFT: No flowsheet data found.  Labs:  Path:  Echo 05/16/20: LV EF 50-55%. RV size and function are normal. LA moderately dilated. RA mildly Dilated.   Heart Catheterization 12/26/17: Previously placed Prox RCA to Mid RCA stent (unknown type) is widely patent. Prox LAD to Mid LAD lesion is 80% stenosed. A stent was successfully placed. Post intervention, there is a 0% residual stenosis. There is severe left ventricular systolic dysfunction. LV end diastolic pressure is mildly elevated. The left ventricular ejection fraction is less than 25% by visual estimate  Assessment & Plan:   No diagnosis found.  Discussion: Dillon Young is an 88 year old male, former smoker with history of GERD, CHF, CAD, hypertension and sleep apnea who is referred to pulmonary clinic for emphysema.   He does have evidence of hyperinflation on chest radiograph concerning for obstructive lung disease.  We will obtain pulmonary function test for further evaluation.  We have provided patient with a sample of Spiriva 2.5 MCG 2 puffs daily.  He is to monitor for any improvement in his cough or breathing  and we will then send in a prescription.  Overall patient remains very functional and is not limited from a respiratory standpoint.  Follow-up in 4 to 6 weeks for pulmonary function testing and office visit.  Freda Jackson, MD [facility] Office: [phone]   Current Outpatient Medications:    acetaminophen (TYLENOL) 500 MG tablet, Take 500 mg by mouth every 6 (six) hours as needed for mild pain or headache., Disp: , Rfl:    albuterol (VENTOLIN HFA) 108 (90 Base) MCG/ACT inhaler, Inhale 2 puffs into the lungs every 6 (six) hours as needed for wheezing or shortness of breath., Disp: 1 each, Rfl: 3   apixaban (ELIQUIS) 5 MG TABS tablet, Take 1 tablet (5 mg total) by mouth  2 (two) times daily., Disp: 180 tablet, Rfl: 1   carvedilol (COREG) 3.125 MG tablet, Take 1 tablet (3.125 mg total) by mouth 2 (two) times daily with a meal. NEEDS APPOINTMENT FOR MORE REFILLS, Disp: 60 tablet, Rfl: 3   carvedilol (COREG) 6.25 MG tablet, TAKE 1 TABLET BY MOUTH 2 TIMES DAILY WITH A MEAL. (Patient taking differently: Take 9.375 mg by mouth in the morning and at bedtime.), Disp: 180 tablet, Rfl: 3   Cholecalciferol (VITAMIN D-3) 25 MCG (1000 UT) CAPS, Take 1,000 Units by mouth daily., Disp: , Rfl:    diphenoxylate-atropine (LOMOTIL) 2.5-0.025 MG tablet, Take 1 tablet by mouth 4 (four) times daily as needed for diarrhea or loose stools., Disp: 30 tablet, Rfl: 0   dofetilide (TIKOSYN) 125 MCG capsule, TAKE 1 CAPSULE BY MOUTH TWICE DAILY, Disp: 180 capsule, Rfl: 3   Ferric Maltol (ACCRUFER) 30 MG CAPS, Take 1 capsule by mouth in the morning and at bedtime., Disp: 180 capsule, Rfl: 1   fexofenadine (ALLEGRA) 180 MG tablet, Take 180 mg by mouth daily as needed for allergies or rhinitis. , Disp: , Rfl:    hydrocortisone (ANUSOL-HC) 2.5 % rectal cream, Place 1 application rectally 3 (three) times daily as needed for hemorrhoids., Disp: 30 g, Rfl: 1   Hypromellose (ARTIFICIAL TEARS OP), Place 1  drop into both eyes 4 (four) times daily as needed (dry eyes)., Disp: , Rfl:    ipratropium (ATROVENT) 0.06 % nasal spray, Place 2 sprays into both nostrils 4 (four) times daily as needed for rhinitis., Disp: 90 mL, Rfl: 1   levocetirizine (XYZAL) 5 MG tablet, Take 1 tablet (5 mg total) by mouth every evening., Disp: 90 tablet, Rfl: 1   nitroGLYCERIN (NITROSTAT) 0.4 MG SL tablet, Place 1 tablet (0.4 mg total) under the tongue every 5 (five) minutes as needed for chest pain., Disp: 25 tablet, Rfl: 3   rosuvastatin (CRESTOR) 10 MG tablet, Take 1 tablet (10 mg total) by mouth daily., Disp: 90 tablet, Rfl: 1   sacubitril-valsartan (ENTRESTO) 97-103 MG, Take 1 tablet by mouth 2 (two) times daily., Disp: 180 tablet, Rfl: 1   spironolactone (ALDACTONE) 25 MG tablet, Take 2 tablets (50 mg total) by mouth daily., Disp: 180 tablet, Rfl: 3   triamcinolone cream (KENALOG) 0.1 %, Apply 1 application topically daily as needed (to affected areas, for itching). , Disp: , Rfl:    Wheat Dextrin (BENEFIBER DRINK MIX PO), Take 2 Scoops by mouth daily in the afternoon. Take 2 tablespoons daily, Disp: , Rfl:

## 2021-06-30 NOTE — Addendum Note (Signed)
Addended by: Valerie Salts on: 06/30/2021 12:19 PM   Modules accepted: Orders

## 2021-07-04 DIAGNOSIS — Z5111 Encounter for antineoplastic chemotherapy: Secondary | ICD-10-CM | POA: Diagnosis not present

## 2021-07-04 DIAGNOSIS — C678 Malignant neoplasm of overlapping sites of bladder: Secondary | ICD-10-CM | POA: Diagnosis not present

## 2021-07-05 DIAGNOSIS — H04123 Dry eye syndrome of bilateral lacrimal glands: Secondary | ICD-10-CM | POA: Diagnosis not present

## 2021-07-05 DIAGNOSIS — H26491 Other secondary cataract, right eye: Secondary | ICD-10-CM | POA: Diagnosis not present

## 2021-07-05 DIAGNOSIS — H02055 Trichiasis without entropian left lower eyelid: Secondary | ICD-10-CM | POA: Diagnosis not present

## 2021-07-11 DIAGNOSIS — C678 Malignant neoplasm of overlapping sites of bladder: Secondary | ICD-10-CM | POA: Diagnosis not present

## 2021-07-17 ENCOUNTER — Telehealth: Payer: Self-pay | Admitting: Cardiovascular Disease

## 2021-07-17 NOTE — Telephone Encounter (Signed)
Patient stopped in to the office to speak with [name]. Reagarding paperwork for Entresto. Please f/u with patient.    Patient can be reached at [phone]

## 2021-07-18 ENCOUNTER — Encounter: Payer: Self-pay | Admitting: Cardiovascular Disease

## 2021-07-18 NOTE — Telephone Encounter (Signed)
Spoke with pt and advised that we placed 2 bottles of Entresto 49-51 mg for him to pick up. Pt will take 2 tablets in the AM and 2 in the PM. Pt verbalized understanding and had no additional questions.

## 2021-07-19 DIAGNOSIS — Z5111 Encounter for antineoplastic chemotherapy: Secondary | ICD-10-CM | POA: Diagnosis not present

## 2021-07-19 DIAGNOSIS — C678 Malignant neoplasm of overlapping sites of bladder: Secondary | ICD-10-CM | POA: Diagnosis not present

## 2021-07-21 ENCOUNTER — Other Ambulatory Visit: Payer: Self-pay | Admitting: Podiatry

## 2021-07-21 ENCOUNTER — Other Ambulatory Visit: Payer: Self-pay | Admitting: Cardiovascular Disease

## 2021-07-21 ENCOUNTER — Encounter: Payer: Self-pay | Admitting: Cardiovascular Disease

## 2021-07-24 MED ORDER — SACUBITRIL-VALSARTAN 97-103 MG PO TABS: 1.0000 | ORAL_TABLET | Freq: Two times a day (BID) | ORAL | 3 refills | Status: DC

## 2021-07-24 NOTE — Telephone Encounter (Signed)
Prescription refill request for Eliquis received. Indication:Afib Last office visit:1/23 Scr:1.0 Age: 88 Weight:78.7 kg  Prescription refilled

## 2021-07-28 ENCOUNTER — Encounter: Payer: Self-pay | Admitting: Pulmonary Disease

## 2021-07-28 ENCOUNTER — Ambulatory Visit: Payer: HMO | Admitting: Pulmonary Disease

## 2021-07-28 ENCOUNTER — Other Ambulatory Visit: Payer: Self-pay

## 2021-07-28 ENCOUNTER — Ambulatory Visit (INDEPENDENT_AMBULATORY_CARE_PROVIDER_SITE_OTHER): Payer: HMO | Admitting: Pulmonary Disease

## 2021-07-28 VITALS — BP 120/64 | HR 55 | Ht 68.0 in | Wt 177.8 lb

## 2021-07-28 DIAGNOSIS — J310 Chronic rhinitis: Secondary | ICD-10-CM | POA: Diagnosis not present

## 2021-07-28 DIAGNOSIS — J439 Emphysema, unspecified: Secondary | ICD-10-CM

## 2021-07-28 LAB — PULMONARY FUNCTION TEST
DL/VA % pred: 107 %
DL/VA: 4.12 ml/min/mmHg/L
DLCO cor % pred: 81 %
DLCO cor: 18.01 ml/min/mmHg
DLCO unc % pred: 80 %
DLCO unc: 17.81 ml/min/mmHg
FEF 25-75 Post: 1.3 L/sec
FEF 25-75 Pre: 1.06 L/sec
FEF2575-%Change-Post: 22 %
FEF2575-%Pred-Post: 90 %
FEF2575-%Pred-Pre: 73 %
FEV1-%Change-Post: 5 %
FEV1-%Pred-Post: 83 %
FEV1-%Pred-Pre: 79 %
FEV1-Post: 1.95 L
FEV1-Pre: 1.85 L
FEV1FVC-%Change-Post: 0 %
FEV1FVC-%Pred-Pre: 97 %
FEV6-%Change-Post: 3 %
FEV6-%Pred-Post: 88 %
FEV6-%Pred-Pre: 85 %
FEV6-Post: 2.75 L
FEV6-Pre: 2.65 L
FEV6FVC-%Change-Post: 0 %
FEV6FVC-%Pred-Post: 106 %
FEV6FVC-%Pred-Pre: 106 %
FVC-%Change-Post: 4 %
FVC-%Pred-Post: 83 %
FVC-%Pred-Pre: 79 %
FVC-Post: 2.83 L
FVC-Pre: 2.71 L
Post FEV1/FVC ratio: 69 %
Post FEV6/FVC ratio: 98 %
Pre FEV1/FVC ratio: 68 %
Pre FEV6/FVC Ratio: 98 %
RV % pred: 109 %
RV: 2.95 L
TLC % pred: 85 %
TLC: 5.68 L

## 2021-07-28 MED ORDER — SPIRIVA RESPIMAT 2.5 MCG/ACT IN AERS: 2.0000 | INHALATION_SPRAY | Freq: Every day | RESPIRATORY_TRACT | 11 refills | Status: DC

## 2021-07-28 MED ORDER — IPRATROPIUM BROMIDE 0.06 % NA SOLN: 2.0000 | Freq: Four times a day (QID) | NASAL | 1 refills | Status: DC | PRN

## 2021-07-28 NOTE — Progress Notes (Signed)
PFT done today. 

## 2021-07-28 NOTE — Patient Instructions (Addendum)
Use spiriva 2.67mcg 2 puffs daily ? ?Use fluticasone nasal spray, 1 spray per nostril daily. Can increase to 2 sprays per nostril daily ? ?Start ipratropium nasal spray, 2 sprays per nostril twice daily. Can use as needed for sinus congestion. ? ?Take allegra daily for allergies ? ?Follow up in 6 months.  ? ? ? ? ?

## 2021-07-28 NOTE — Progress Notes (Signed)
Synopsis: Referred in February 2023 for emphysema by [name], MD  Subjective:   PATIENT ID: Dillon Young GENDER: male DOB: [birth year], MRN: [id number]  HPI  Chief Complaint  Patient presents with   Follow-up    F/U after PFT. States his breathing has been stable since last visit.    Dillon Young is an 88 year old male, former smoker with history of GERD, CHF, CAD, hypertension and sleep apnea who returns to pulmonary clinic for emphysema.   He reports his breathing is a bit better after the 1 month spiriva sample. He reports his cough is much better. He is complaining of some hoarseness in his voice. He has post-nasal drainage.  Pulmonary function tests today shoe mild obstructive defect.   OV 06/30/21 Patient reports having COVID infection 3 weeks ago with significant cough, mucus production and shortness of breath.  He denied any wheezing or chest tightness.  The cough has since resolved since his acute illness and he reports some mucus production every couple of days.  He had a chest x-ray performed on 06/07/21 which showed findings of hyperinflation concerning for emphysema prompting referral to our clinic.  He denies any limitation to physical activity due to shortness of breath.  He walks 3 to 4 days/week, 30 minutes at a time without any issue.  He quit smoking in 2010.  He has a 56-year smoking history and smoked 1.5 packs/day.  He is retired and worked for [employer].  He denies any significant dust or chemical exposures.  Past Medical History:  Diagnosis Date   Adenomatous colon polyp    Allergic rhinitis    Anemia    Anxiety    Bladder cancer (HCC)    BPH (benign prostatic hyperplasia)    CAD (coronary artery disease)    sees Dr. [name]    Carotid artery disease [facility])    CHF (congestive heart failure) ([place])    Chronic kidney disease    S[place]. Prostate cancer   Clotting disorder (HCC)    CVA (cerebral infarction)    Diabetes  mellitus    "Borderline"   Diverticulitis    Dysrhythmia    atrial fib - hx of    Edema    recent swelling in feet and ankles    GERD (gastroesophageal reflux disease)    history   Headache    Hemorrhoids    History of kidney stones    HTN (hypertension)    Hx of radiation therapy    for prostate - 2013    Hyperlipidemia LDL goal <70    IBS (irritable bowel syndrome)    Low back pain    Pre-diabetes    Prostate cancer ([place]) 2008   seed implantation   Sleep apnea    sitting in recliner helps him to breathe per pt , never used cpap sleep study - pt reports could not go to sleep- 6 years ago    Smoker    Stroke [facility])    hx of mini strokes years ago      Family History  Problem Relation Age of Onset   Heart disease Father    Hypertension Mother    Asthma Paternal Grandmother    Colon cancer Neg Hx    Esophageal cancer Neg Hx    Stomach cancer Neg Hx    Rectal cancer Neg Hx    Liver cancer Neg Hx      Social History   Socioeconomic History   Marital  status: Married    Spouse name: Not on file   Number of children: 2   Years of education: Not on file   Highest education level: Not on file  Occupational History   Occupation: [employer]  Tobacco Use   Smoking status: Former    Packs/day: 1.50    Years: 60.00    Pack years: 90.00    Types: Cigarettes    Quit date: 07/25/2008    Years since quitting: 13.0   Smokeless tobacco: Never  Vaping Use   Vaping Use: Never used  Substance and Sexual Activity   Alcohol use: Not Currently    Comment: occassionally   Drug use: No   Sexual activity: Not Currently  Other Topics Concern   Not on file  Social History Narrative   Married second time   Retired [employer] yrs   2 daughters and 2 stepdaughters   Social Determinants of Radio broadcast assistant Strain: Low Risk    Difficulty of Paying Living Expenses: Not hard at all  Food Insecurity: Not on file  Transportation Needs: No  Transportation Needs   Lack of Transportation (Medical): No   Lack of Transportation (Non-Medical): No  Physical Activity: Sufficiently Active   Days of Exercise per Week: 5 days   Minutes of Exercise per Session: 30 min  Stress: No Stress Concern Present   Feeling of Stress : Not at all  Social Connections: Socially Integrated   Frequency of Communication with Friends and Family: More than three times a week   Frequency of Social Gatherings with Friends and Family: More than three times a week   Attends Religious Services: More than 4 times per year   Active Member of [employer] or Organizations: Yes   Attends Music therapist: More than 4 times per year   Marital Status: Married  Human resources officer Violence: Not on file     Allergies  Allergen Reactions   Lipitor [Atorvastatin] Other (See Comments)    Muscle pain   Other Rash and Other (See Comments)    Detergent- Rashes on the skin     Outpatient Medications Prior to Visit  Medication Sig Dispense Refill   acetaminophen (TYLENOL) 500 MG tablet Take 500 mg by mouth every 6 (six) hours as needed for mild pain or headache.     albuterol (VENTOLIN HFA) 108 (90 Base) MCG/ACT inhaler Inhale 2 puffs into the lungs every 6 (six) hours as needed for wheezing or shortness of breath. 1 each 3   carvedilol (COREG) 3.125 MG tablet Take 1 tablet (3.125 mg total) by mouth 2 (two) times daily with a meal. NEEDS APPOINTMENT FOR MORE REFILLS 60 tablet 3   carvedilol (COREG) 6.25 MG tablet TAKE 1 TABLET BY MOUTH 2 TIMES DAILY WITH A MEAL. (Patient taking differently: Take 9.375 mg by mouth in the morning and at bedtime.) 180 tablet 3   Cholecalciferol (VITAMIN D-3) 25 MCG (1000 UT) CAPS Take 1,000 Units by mouth daily.     diphenoxylate-atropine (LOMOTIL) 2.5-0.025 MG tablet Take 1 tablet by mouth 4 (four) times daily as needed for diarrhea or loose stools. 30 tablet 0   dofetilide (TIKOSYN) 125 MCG capsule TAKE 1 CAPSULE BY MOUTH TWICE DAILY  180 capsule 3   ELIQUIS 5 MG TABS tablet TAKE 1 TABLET BY MOUTH TWICE A DAY 180 tablet 1   Ferric Maltol (ACCRUFER) 30 MG CAPS Take 1 capsule by mouth in the morning and at bedtime. [place]  capsule 1   fexofenadine (ALLEGRA) 180 MG tablet Take 180 mg by mouth daily as needed for allergies or rhinitis.      fluticasone (FLONASE) 50 MCG/ACT nasal spray Place 1 spray into both nostrils daily.     hydrocortisone (ANUSOL-HC) 2.5 % rectal cream Place 1 application rectally 3 (three) times daily as needed for hemorrhoids. 30 g 1   Hypromellose (ARTIFICIAL TEARS OP) Place 1 drop into both eyes 4 (four) times daily as needed (dry eyes).     levocetirizine (XYZAL) 5 MG tablet Take 1 tablet (5 mg total) by mouth every evening. 90 tablet 1   nitroGLYCERIN (NITROSTAT) 0.4 MG SL tablet Place 1 tablet (0.4 mg total) under the tongue every 5 (five) minutes as needed for chest pain. 25 tablet 3   rosuvastatin (CRESTOR) 10 MG tablet Take 1 tablet (10 mg total) by mouth daily. 90 tablet 1   sacubitril-valsartan (ENTRESTO) 97-103 MG Take 1 tablet by mouth 2 (two) times daily. 180 tablet 3   spironolactone (ALDACTONE) 25 MG tablet Take 2 tablets (50 mg total) by mouth daily. 180 tablet 3   triamcinolone cream (KENALOG) 0.1 % Apply 1 application topically daily as needed (to affected areas, for itching).      Wheat Dextrin (BENEFIBER DRINK MIX PO) Take 2 Scoops by mouth daily in the afternoon. Take 2 tablespoons daily     ipratropium (ATROVENT) 0.06 % nasal spray Place 2 sprays into both nostrils 4 (four) times daily as needed for rhinitis. 90 mL 1   Tiotropium Bromide Monohydrate (SPIRIVA RESPIMAT) 2.5 MCG/ACT AERS Inhale 2 puffs into the lungs daily. 8 g 0   No facility-administered medications prior to visit.   Review of Systems  Constitutional:  Negative for chills, fever, malaise/fatigue and weight loss.  HENT:  Positive for congestion. Negative for sinus pain and sore throat.   Eyes: Negative.   Respiratory:   Negative for cough, hemoptysis, sputum production, shortness of breath and wheezing.   Cardiovascular:  Negative for chest pain, palpitations, orthopnea, claudication and leg swelling.  Gastrointestinal:  Negative for abdominal pain, heartburn, nausea and vomiting.  Genitourinary: Negative.   Musculoskeletal:  Negative for joint pain and myalgias.  Skin:  Negative for rash.  Neurological:  Negative for weakness.  Endo/Heme/Allergies: Negative.   Psychiatric/Behavioral: Negative.     Objective:   Vitals:   07/28/21 1323  BP: 120/64  Pulse: (!) 55  SpO2: 98%  Weight: 177 lb 12.8 oz (80.6 kg)  Height: 5\' 8"  (1.727 m)    Physical Exam Constitutional:      General: He is not in acute distress. HENT:     Head: Normocephalic and atraumatic.  Eyes:     Conjunctiva/sclera: Conjunctivae normal.  Cardiovascular:     Rate and Rhythm: Normal rate and regular rhythm.     Pulses: Normal pulses.     Heart sounds: Normal heart sounds. No murmur heard. Pulmonary:     Effort: Pulmonary effort is normal.     Breath sounds: No wheezing, rhonchi or rales.  Musculoskeletal:     Right lower leg: No edema.     Left lower leg: No edema.  Skin:    General: Skin is warm and dry.  Neurological:     General: No focal deficit present.     Mental Status: He is alert.  Psychiatric:        Mood and Affect: Mood normal.        Behavior: Behavior normal.  Thought Content: Thought content normal.        Judgment: Judgment normal.   CBC    Component Value Date/Time   WBC 3.9 (L) 06/07/2021 0914   RBC 5.21 06/07/2021 0914   HGB 14.2 06/07/2021 0914   HGB 12.7 (L) 02/16/2021 0943   HCT 43.7 06/07/2021 0914   HCT 40.3 02/16/2021 0943   PLT 165.0 06/07/2021 0914   PLT 136 (L) 02/16/2021 0943   MCV 83.9 06/07/2021 0914   MCV 83 02/16/2021 0943   MCH 26.1 (L) 02/16/2021 0943   MCH 26.6 12/28/2020 1326   MCHC 32.5 06/07/2021 0914   RDW 16.5 (H) 06/07/2021 0914   RDW 14.2 02/16/2021 0943    LYMPHSABS 0.9 06/07/2021 0914   LYMPHSABS 1.0 12/18/2017 1049   MONOABS 0.4 06/07/2021 0914   EOSABS 0.1 06/07/2021 0914   EOSABS 0.1 12/18/2017 1049   BASOSABS 0.0 06/07/2021 0914   BASOSABS 0.0 12/18/2017 1049   BMP Latest Ref Rng & Units 02/16/2021 12/28/2020 09/30/2020  Glucose 65 - 99 mg/dL 107(H) 100(H) 141(H)  BUN 8 - 27 mg/dL 20 23 20   Creatinine 0.76 - 1.27 mg/dL 1.06 1.00 1.16  BUN/Creat Ratio 10 - 24 19 - -  Sodium 134 - 144 mmol/L 143 142 140  Potassium 3.5 - 5.2 mmol/L 4.4 4.7 4.2  Chloride 96 - 106 mmol/L 103 102 106  CO2 20 - 29 mmol/L 25 28 27   Calcium 8.6 - 10.2 mg/dL 9.3 9.4 9.0   Chest imaging: CXR 06/07/21 Cardiomediastinal silhouette unchanged in size and contour. No evidence of central vascular congestion. No interlobular septal thickening.   Stigmata of emphysema, with increased retrosternal airspace, flattened hemidiaphragms, increased AP diameter, and hyperinflation on the AP view.  PFT: PFT Results Latest Ref Rng & Units 07/28/2021  FVC-Pre L 2.71  FVC-Predicted Pre % 79  FVC-Post L 2.83  FVC-Predicted Post % 83  Pre FEV1/FVC % % 68  Post FEV1/FCV % % 69  FEV1-Pre L 1.85  FEV1-Predicted Pre % 79  FEV1-Post L 1.95  DLCO uncorrected ml/min/mmHg 17.81  DLCO UNC% % 80  DLCO corrected ml/min/mmHg 18.01  DLCO COR %Predicted % 81  DLVA Predicted % 107  TLC L 5.68  TLC % Predicted % 85  RV % Predicted % 109  PFTs 2023: mild obstructive defect  Labs:  Path:  Echo 05/16/20: LV EF 50-55%. RV size and function are normal. LA moderately dilated. RA mildly Dilated.   Heart Catheterization 12/26/17: Previously placed Prox RCA to Mid RCA stent (unknown type) is widely patent. Prox LAD to Mid LAD lesion is 80% stenosed. A stent was successfully placed. Post intervention, there is a 0% residual stenosis. There is severe left ventricular systolic dysfunction. LV end diastolic pressure is mildly elevated. The left ventricular ejection fraction is less than  25% by visual estimate  Assessment & Plan:   Pulmonary emphysema, unspecified emphysema type ([place]) - Plan: Tiotropium Bromide Monohydrate (SPIRIVA RESPIMAT) 2.5 MCG/ACT AERS  Chronic rhinitis - Plan: ipratropium (ATROVENT) 0.06 % nasal spray  Discussion: Dillon Young is an 88 year old male, former smoker with history of GERD, CHF, CAD, hypertension and sleep apnea who returns to pulmonary clinic for emphysema.   He has mild obstructive defect on pulmonary function tests. We will send in prescription for spiriva 2.97mcg 2 puffs twice daily as he noted benefit with the sample.   For chronic rhinitis he is to use fluticasone nasal spray and start ipratropium nasal spray, 2 sprays per nostril  twice daily as needed. He is to take allegra for allergies.  Follow up in 6 months.   Freda Jackson, MD [facility] Office: [phone]   Current Outpatient Medications:    acetaminophen (TYLENOL) 500 MG tablet, Take 500 mg by mouth every 6 (six) hours as needed for mild pain or headache., Disp: , Rfl:    albuterol (VENTOLIN HFA) 108 (90 Base) MCG/ACT inhaler, Inhale 2 puffs into the lungs every 6 (six) hours as needed for wheezing or shortness of breath., Disp: 1 each, Rfl: 3   carvedilol (COREG) 3.125 MG tablet, Take 1 tablet (3.125 mg total) by mouth 2 (two) times daily with a meal. NEEDS APPOINTMENT FOR MORE REFILLS, Disp: 60 tablet, Rfl: 3   carvedilol (COREG) 6.25 MG tablet, TAKE 1 TABLET BY MOUTH 2 TIMES DAILY WITH A MEAL. (Patient taking differently: Take 9.375 mg by mouth in the morning and at bedtime.), Disp: 180 tablet, Rfl: 3   Cholecalciferol (VITAMIN D-3) 25 MCG (1000 UT) CAPS, Take 1,000 Units by mouth daily., Disp: , Rfl:    diphenoxylate-atropine (LOMOTIL) 2.5-0.025 MG tablet, Take 1 tablet by mouth 4 (four) times daily as needed for diarrhea or loose stools., Disp: 30 tablet, Rfl: 0   dofetilide (TIKOSYN) 125 MCG capsule, TAKE 1 CAPSULE BY MOUTH TWICE DAILY,  Disp: 180 capsule, Rfl: 3   ELIQUIS 5 MG TABS tablet, TAKE 1 TABLET BY MOUTH TWICE A DAY, Disp: 180 tablet, Rfl: 1   Ferric Maltol (ACCRUFER) 30 MG CAPS, Take 1 capsule by mouth in the morning and at bedtime., Disp: 180 capsule, Rfl: 1   fexofenadine (ALLEGRA) 180 MG tablet, Take 180 mg by mouth daily as needed for allergies or rhinitis. , Disp: , Rfl:    fluticasone (FLONASE) 50 MCG/ACT nasal spray, Place 1 spray into both nostrils daily., Disp: , Rfl:    hydrocortisone (ANUSOL-HC) 2.5 % rectal cream, Place 1 application rectally 3 (three) times daily as needed for hemorrhoids., Disp: 30 g, Rfl: 1   Hypromellose (ARTIFICIAL TEARS OP), Place 1 drop into both eyes 4 (four) times daily as needed (dry eyes)., Disp: , Rfl:    levocetirizine (XYZAL) 5 MG tablet, Take 1 tablet (5 mg total) by mouth every evening., Disp: 90 tablet, Rfl: 1   nitroGLYCERIN (NITROSTAT) 0.4 MG SL tablet, Place 1 tablet (0.4 mg total) under the tongue every 5 (five) minutes as needed for chest pain., Disp: 25 tablet, Rfl: 3   rosuvastatin (CRESTOR) 10 MG tablet, Take 1 tablet (10 mg total) by mouth daily., Disp: 90 tablet, Rfl: 1   sacubitril-valsartan (ENTRESTO) 97-103 MG, Take 1 tablet by mouth 2 (two) times daily., Disp: 180 tablet, Rfl: 3   spironolactone (ALDACTONE) 25 MG tablet, Take 2 tablets (50 mg total) by mouth daily., Disp: 180 tablet, Rfl: 3   Tiotropium Bromide Monohydrate (SPIRIVA RESPIMAT) 2.5 MCG/ACT AERS, Inhale 2 puffs into the lungs daily., Disp: 4 g, Rfl: 11   triamcinolone cream (KENALOG) 0.1 %, Apply 1 application topically daily as needed (to affected areas, for itching). , Disp: , Rfl:    Wheat Dextrin (BENEFIBER DRINK MIX PO), Take 2 Scoops by mouth daily in the afternoon. Take 2 tablespoons daily, Disp: , Rfl:    ipratropium (ATROVENT) 0.06 % nasal spray, Place 2 sprays into both nostrils 4 (four) times daily as needed for rhinitis., Disp: 90 mL, Rfl: 1

## 2021-07-29 ENCOUNTER — Encounter: Payer: Self-pay | Admitting: Pulmonary Disease

## 2021-08-02 ENCOUNTER — Telehealth: Payer: Self-pay

## 2021-08-02 NOTE — Progress Notes (Signed)
? ? ?Chronic Care Management ?Pharmacy Assistant  ? ?Name: PRENTISS POLIO  MRN: [id number] DOB: 09/26/1932 ? ?AUGIE VANE is an 88 y.o. year old male who presents for his follow-up CCM visit with the clinical pharmacist. ? ?Reason for Encounter: Disease State ?  ?Conditions to be addressed/monitored: ?HLD ? ? ?Recent office visits:  ?06/26/21 [name], MD-PCP (Hypertension,diabetes, and CHF) Orders: none, Med changes: Discontinued sodium chloride,fluticasone, and triamcinolone ? ?06/07/21 [name], MD-PCP (Cough and anemia) Ambulatory referral to Pulmonology ? ?Recent consult visits:  ?07/28/21 Freda Jackson B, MD-Pulmonary Disease (F/U after PFT) No orders or med changes ? ?06/30/21 Freddi Starr, MD-Pulmonary Disease (Pulmonary emphysema, unspecified emphysema type) Orders: PFT, med changes: Spiriva inhaler ? ?06/21/21 [name], MD-Cardiology (Hyperlipidemia) No orders or med changes ? ?Hospital visits:  ?None in previous 6 months ? ?Medications: ?Outpatient Encounter Medications as of 08/02/2021  ?Medication Sig  ? acetaminophen (TYLENOL) 500 MG tablet Take 500 mg by mouth every 6 (six) hours as needed for mild pain or headache.  ? albuterol (VENTOLIN HFA) 108 (90 Base) MCG/ACT inhaler Inhale 2 puffs into the lungs every 6 (six) hours as needed for wheezing or shortness of breath.  ? carvedilol (COREG) 3.125 MG tablet Take 1 tablet (3.125 mg total) by mouth 2 (two) times daily with a meal. NEEDS APPOINTMENT FOR MORE REFILLS  ? carvedilol (COREG) 6.25 MG tablet TAKE 1 TABLET BY MOUTH 2 TIMES DAILY WITH A MEAL. (Patient taking differently: Take 9.375 mg by mouth in the morning and at bedtime.)  ? Cholecalciferol (VITAMIN D-3) 25 MCG (1000 UT) CAPS Take 1,000 Units by mouth daily.  ? diphenoxylate-atropine (LOMOTIL) 2.5-0.025 MG tablet Take 1 tablet by mouth 4 (four) times daily as needed for diarrhea or loose stools.  ? dofetilide (TIKOSYN) 125 MCG capsule TAKE 1 CAPSULE BY MOUTH TWICE  DAILY  ? ELIQUIS 5 MG TABS tablet TAKE 1 TABLET BY MOUTH TWICE A DAY  ? Ferric Maltol (ACCRUFER) 30 MG CAPS Take 1 capsule by mouth in the morning and at bedtime.  ? fexofenadine (ALLEGRA) 180 MG tablet Take 180 mg by mouth daily as needed for allergies or rhinitis.   ? fluticasone (FLONASE) 50 MCG/ACT nasal spray Place 1 spray into both nostrils daily.  ? hydrocortisone (ANUSOL-HC) 2.5 % rectal cream Place 1 application rectally 3 (three) times daily as needed for hemorrhoids.  ? Hypromellose (ARTIFICIAL TEARS OP) Place 1 drop into both eyes 4 (four) times daily as needed (dry eyes).  ? ipratropium (ATROVENT) 0.06 % nasal spray Place 2 sprays into both nostrils 4 (four) times daily as needed for rhinitis.  ? levocetirizine (XYZAL) 5 MG tablet Take 1 tablet (5 mg total) by mouth every evening.  ? nitroGLYCERIN (NITROSTAT) 0.4 MG SL tablet Place 1 tablet (0.4 mg total) under the tongue every 5 (five) minutes as needed for chest pain.  ? rosuvastatin (CRESTOR) 10 MG tablet Take 1 tablet (10 mg total) by mouth daily.  ? sacubitril-valsartan (ENTRESTO) 97-103 MG Take 1 tablet by mouth 2 (two) times daily.  ? spironolactone (ALDACTONE) 25 MG tablet Take 2 tablets (50 mg total) by mouth daily.  ? Tiotropium Bromide Monohydrate (SPIRIVA RESPIMAT) 2.5 MCG/ACT AERS Inhale 2 puffs into the lungs daily.  ? triamcinolone cream (KENALOG) 0.1 % Apply 1 application topically daily as needed (to affected areas, for itching).   ? Wheat Dextrin (BENEFIBER DRINK MIX PO) Take 2 Scoops by mouth daily in the afternoon. Take 2 tablespoons  daily  ? ?No facility-administered encounter medications on file as of 08/02/2021.  ? ?08/02/2021 ?Name: ZADEN SAKO MRN: [id number] DOB: 05/11/1933 ?BARTLETT ENKE is a 88 y.o. year old male who is a primary care patient of [name], MD.  ?Comprehensive medication review performed; Spoke to patient regarding cholesterol ? ?Lipid Panel ?   ?Component Value Date/Time  ? CHOL 123 06/16/2020 1059  ?  CHOL 121 10/02/2017 1011  ? TRIG 93 06/16/2020 1059  ? HDL 51 06/16/2020 1059  ? HDL 46 10/02/2017 1011  ? LDLCALC 53 06/16/2020 1059  ? [place] 50 10/02/2017 1011  ?  ?10-year ASCVD risk score: The ASCVD Risk score (Arnett DK, et al., 2019) failed to calculate for the following reasons: ?  The 2019 ASCVD risk score is only valid for ages 24 to 66 ? ?Current antihyperlipidemic regimen:  ?Rosuvastatin 10 mg ? ?Previous antihyperlipidemic medications tried: none noted ? ?ASCVD risk enhancing conditions: age >2 ? ?What recent interventions/DTPs have been made by any provider to improve Cholesterol control since last CPP Visit: none noted ? ?Any recent hospitalizations or ED visits since last visit with CPP? No ? ?What diet changes have been made to improve Cholesterol?  ?Patient states that he has not made any changes in diet ? ?What exercise is being done to improve Cholesterol?  ?Patient states that he walks 4 times a day ? ?Adherence Review: ?Does the patient have >5 day gap between last estimated fill dates? No ? ? ?  ?Care Gaps: ?Colonoscopy-NA ?Diabetic Foot Exam-01/12/19 ?Ophthalmology-08/15/20 ?Dexa Scan - NA ?Annual Well Visit - NA ?Micro albumin-NA ?Hemoglobin A1c- 06/07/21 ? ?Star Rating Drugs: ?Entresto 97-103 mg-last fill Manufacturer ?Rosuvastatin 10 mg-last fill 06/25/21 90 ? ?Ethelene Hal ?Clinical Pharmacist Assistant ?[phone]  ?

## 2021-08-03 ENCOUNTER — Other Ambulatory Visit (HOSPITAL_COMMUNITY): Payer: Self-pay | Admitting: Cardiology

## 2021-08-14 ENCOUNTER — Other Ambulatory Visit: Payer: Self-pay | Admitting: Podiatry

## 2021-08-21 ENCOUNTER — Encounter: Payer: Self-pay | Admitting: Internal Medicine

## 2021-08-23 DIAGNOSIS — H524 Presbyopia: Secondary | ICD-10-CM | POA: Diagnosis not present

## 2021-08-23 DIAGNOSIS — H02055 Trichiasis without entropian left lower eyelid: Secondary | ICD-10-CM | POA: Diagnosis not present

## 2021-08-23 DIAGNOSIS — H26491 Other secondary cataract, right eye: Secondary | ICD-10-CM | POA: Diagnosis not present

## 2021-08-23 DIAGNOSIS — H04123 Dry eye syndrome of bilateral lacrimal glands: Secondary | ICD-10-CM | POA: Diagnosis not present

## 2021-08-24 ENCOUNTER — Encounter: Payer: Self-pay | Admitting: Internal Medicine

## 2021-08-24 ENCOUNTER — Ambulatory Visit (INDEPENDENT_AMBULATORY_CARE_PROVIDER_SITE_OTHER): Payer: HMO

## 2021-08-24 ENCOUNTER — Ambulatory Visit (INDEPENDENT_AMBULATORY_CARE_PROVIDER_SITE_OTHER): Payer: HMO | Admitting: Internal Medicine

## 2021-08-24 VITALS — BP 132/84 | HR 64 | Temp 97.6°F | Ht 68.0 in | Wt 179.0 lb

## 2021-08-24 DIAGNOSIS — K5909 Other constipation: Secondary | ICD-10-CM | POA: Diagnosis not present

## 2021-08-24 DIAGNOSIS — R1084 Generalized abdominal pain: Secondary | ICD-10-CM

## 2021-08-24 DIAGNOSIS — E119 Type 2 diabetes mellitus without complications: Secondary | ICD-10-CM | POA: Diagnosis not present

## 2021-08-24 DIAGNOSIS — N200 Calculus of kidney: Secondary | ICD-10-CM | POA: Diagnosis not present

## 2021-08-24 DIAGNOSIS — I700 Atherosclerosis of aorta: Secondary | ICD-10-CM | POA: Diagnosis not present

## 2021-08-24 LAB — BASIC METABOLIC PANEL
BUN: 20 mg/dL (ref 6–23)
CO2: 31 mEq/L (ref 19–32)
Calcium: 9.9 mg/dL (ref 8.4–10.5)
Chloride: 103 mEq/L (ref 96–112)
Creatinine, Ser: 1.07 mg/dL (ref 0.40–1.50)
GFR: 62.58 mL/min (ref >60.00)
Glucose, Bld: 120 mg/dL — ABNORMAL HIGH (ref 70–99)
Potassium: 4.5 mEq/L (ref 3.5–5.1)
Sodium: 140 mEq/L (ref 135–145)

## 2021-08-24 LAB — HEMOGLOBIN A1C: Hgb A1c MFr Bld: 6.4 % (ref 4.6–6.5)

## 2021-08-24 MED ORDER — LINACLOTIDE 145 MCG PO CAPS: 145.0000 ug | ORAL_CAPSULE | Freq: Every day | ORAL | 1 refills | Status: DC

## 2021-08-24 NOTE — Patient Instructions (Signed)

## 2021-08-24 NOTE — Progress Notes (Signed)
? ?Subjective:  ?Patient ID: Dillon Young, male    DOB: 08-Aug-1932  Age: 88 y.o. MRN: [id number] ? ?CC: Diabetes and Abdominal Pain ? ?This visit occurred during the SARS-CoV-2 public health emergency.  Safety protocols were in place, including screening questions prior to the visit, additional usage of staff PPE, and extensive cleaning of exam room while observing appropriate contact time as indicated for disinfecting solutions.   ? ?HPI ?[name] presents for f/up -  ? ?He complains of a several week history of constipation and abdominal pain that he describes as a soreness.  He has had chronic constipation but it worsened recently when he started taking an iron supplement.  The constipation has not responded well to fiber.  He denies nausea, vomiting, bright red blood per rectum, diarrhea, chest pain, or shortness of breath. ? ?Outpatient Medications Prior to Visit  ?Medication Sig Dispense Refill  ? acetaminophen (TYLENOL) 500 MG tablet Take 500 mg by mouth every 6 (six) hours as needed for mild pain or headache.    ? albuterol (VENTOLIN HFA) 108 (90 Base) MCG/ACT inhaler Inhale 2 puffs into the lungs every 6 (six) hours as needed for wheezing or shortness of breath. 1 each 3  ? carvedilol (COREG) 3.125 MG tablet TAKE 1 TABLET BY MOUTH 2 (TWO) TIMES DAILY WITH A MEAL. NEEDS APPOINTMENT FOR MORE REFILLS 60 tablet 0  ? carvedilol (COREG) 6.25 MG tablet TAKE 1 TABLET BY MOUTH 2 TIMES DAILY WITH A MEAL. (Patient taking differently: Take 9.375 mg by mouth in the morning and at bedtime.) 180 tablet 3  ? Cholecalciferol (VITAMIN D-3) 25 MCG (1000 UT) CAPS Take 1,000 Units by mouth daily.    ? dofetilide (TIKOSYN) 125 MCG capsule TAKE 1 CAPSULE BY MOUTH TWICE DAILY 180 capsule 3  ? ELIQUIS 5 MG TABS tablet TAKE 1 TABLET BY MOUTH TWICE A DAY 180 tablet 1  ? Ferric Maltol (ACCRUFER) 30 MG CAPS Take 1 capsule by mouth in the morning and at bedtime. 180 capsule 1  ? fexofenadine (ALLEGRA) 180 MG tablet Take 180  mg by mouth daily as needed for allergies or rhinitis.     ? fluticasone (FLONASE) 50 MCG/ACT nasal spray Place 1 spray into both nostrils daily.    ? hydrocortisone (ANUSOL-HC) 2.5 % rectal cream Place 1 application rectally 3 (three) times daily as needed for hemorrhoids. 30 g 1  ? Hypromellose (ARTIFICIAL TEARS OP) Place 1 drop into both eyes 4 (four) times daily as needed (dry eyes).    ? ipratropium (ATROVENT) 0.06 % nasal spray Place 2 sprays into both nostrils 4 (four) times daily as needed for rhinitis. 90 mL 1  ? levocetirizine (XYZAL) 5 MG tablet Take 1 tablet (5 mg total) by mouth every evening. 90 tablet 1  ? nitroGLYCERIN (NITROSTAT) 0.4 MG SL tablet Place 1 tablet (0.4 mg total) under the tongue every 5 (five) minutes as needed for chest pain. 25 tablet 3  ? rosuvastatin (CRESTOR) 10 MG tablet Take 1 tablet (10 mg total) by mouth daily. 90 tablet 1  ? sacubitril-valsartan (ENTRESTO) 97-103 MG Take 1 tablet by mouth 2 (two) times daily. 180 tablet 3  ? spironolactone (ALDACTONE) 25 MG tablet Take 2 tablets (50 mg total) by mouth daily. 180 tablet 3  ? Tiotropium Bromide Monohydrate (SPIRIVA RESPIMAT) 2.5 MCG/ACT AERS Inhale 2 puffs into the lungs daily. 4 g 11  ? triamcinolone cream (KENALOG) 0.1 % Apply 1 application topically daily as needed (to affected areas,  for itching).     ? Wheat Dextrin (BENEFIBER DRINK MIX PO) Take 2 Scoops by mouth daily in the afternoon. Take 2 tablespoons daily    ? diphenoxylate-atropine (LOMOTIL) 2.5-0.025 MG tablet Take 1 tablet by mouth 4 (four) times daily as needed for diarrhea or loose stools. 30 tablet 0  ? ?No facility-administered medications prior to visit.  ? ? ?ROS ?Review of Systems  ?Constitutional: Negative.  Negative for diaphoresis and fatigue.  ?HENT: Negative.    ?Eyes: Negative.   ?Respiratory:  Negative for cough, chest tightness, shortness of breath and wheezing.   ?Cardiovascular:  Negative for chest pain, palpitations and leg swelling.   ?Gastrointestinal:  Positive for abdominal pain and constipation. Negative for diarrhea, nausea and vomiting.  ?     "Soreness" in his abd  ?Endocrine: Positive for polyuria.  ?Genitourinary:  Positive for urgency. Negative for difficulty urinating and dysuria.  ?Musculoskeletal: Negative.   ?Skin: Negative.   ?Psychiatric/Behavioral:  Negative for hallucinations.   ? ?Objective:  ?BP 132/84 (BP Location: Right Arm, Patient Position: Sitting, Cuff Size: Normal)   Pulse 64   Temp 97.6 ?F (36.4 ?C) (Oral)   Ht '5\' 8"'$  (1.727 m)   Wt 179 lb (81.2 kg)   SpO2 97%   BMI 27.22 kg/m?  ? ?BP Readings from Last 3 Encounters:  ?08/24/21 132/84  ?07/28/21 120/64  ?06/30/21 138/78  ? ? ?Wt Readings from Last 3 Encounters:  ?08/24/21 179 lb (81.2 kg)  ?07/28/21 177 lb 12.8 oz (80.6 kg)  ?06/30/21 173 lb 6.4 oz (78.7 kg)  ? ? ?Physical Exam ?Vitals reviewed.  ?Constitutional:   ?   Appearance: He is not ill-appearing.  ?HENT:  ?   Nose: Nose normal.  ?   Mouth/Throat:  ?   Mouth: Mucous membranes are moist.  ?Eyes:  ?   General: No scleral icterus. ?   Conjunctiva/sclera: Conjunctivae normal.  ?Cardiovascular:  ?   Rate and Rhythm: Normal rate and regular rhythm.  ?   Heart sounds: No murmur heard. ?Pulmonary:  ?   Effort: Pulmonary effort is normal.  ?   Breath sounds: No stridor. No wheezing, rhonchi or rales.  ?Abdominal:  ?   General: Abdomen is flat. Bowel sounds are normal. There is no distension.  ?   Palpations: Abdomen is soft. There is no hepatomegaly, splenomegaly or mass.  ?   Tenderness: There is no abdominal tenderness.  ?Musculoskeletal:     ?   General: Normal range of motion.  ?   Cervical back: Neck supple.  ?   Right lower leg: No edema.  ?   Left lower leg: No edema.  ?Lymphadenopathy:  ?   Cervical: No cervical adenopathy.  ?Skin: ?   General: Skin is warm and dry.  ?Neurological:  ?   General: No focal deficit present.  ?   Mental Status: He is alert. Mental status is at baseline.  ? ? ?Lab Results   ?Component Value Date  ? WBC 3.9 (L) 06/07/2021  ? HGB 14.2 06/07/2021  ? HCT 43.7 06/07/2021  ? PLT 165.0 06/07/2021  ? GLUCOSE 120 (H) 08/24/2021  ? CHOL 123 06/16/2020  ? TRIG 93 06/16/2020  ? HDL 51 06/16/2020  ? [place] 53 06/16/2020  ? ALT 9 06/30/2020  ? AST 12 06/30/2020  ? NA 140 08/24/2021  ? K 4.5 08/24/2021  ? CL 103 08/24/2021  ? CREATININE 1.07 08/24/2021  ? BUN 20 08/24/2021  ? CO2 31 08/24/2021  ?  TSH 2.28 03/07/2021  ? PSA 0.015 12/21/2020  ? INR 1.1 01/06/2021  ? HGBA1C 6.4 08/24/2021  ? MICROALBUR 5.7 01/06/2020  ? ? ?DG C-Arm 1-60 Min-No Report ? ?Result Date: 01/06/2021 ?Fluoroscopy was utilized by the requesting physician.  No radiographic interpretation.  ? ? ?DG ABD ACUTE 2+V W 1V CHEST ? ?Result Date: 08/24/2021 ?CLINICAL DATA:  Hypogastric abdominal pain and more frequent urination since starting iron supplements 2-3 months ago. Former smoker, hypertension, diabetes mellitus, CHF, prior appendectomy EXAM: DG ABDOMEN ACUTE WITH 1 VIEW CHEST COMPARISON:  Chest radiograph 06/07/2021, abdominal radiograph 02/01/2012 FINDINGS: Normal heart size, mediastinal contours, and pulmonary vascularity. Atherosclerotic calcification aorta. Lungs clear. No pulmonary infiltrate, pleural effusion, or pneumothorax. Nonobstructive bowel gas pattern. No bowel dilatation, bowel wall thickening, or free air. 11 x 9 mm LEFT renal calculus. No additional urinary tract calcifications. Osseous structures unremarkable. IMPRESSION: 11 x 9 mm LEFT renal calculus. Otherwise negative exam. Aortic Atherosclerosis (ICD10-I70.0). Electronically Signed   By: Lavonia Dana M.D.   On: 08/24/2021 10:05    ? ?Assessment & Plan:  ? ?Mickie was seen today for diabetes and abdominal pain. ? ?Diagnoses and all orders for this visit: ? ?Diabetes mellitus without complication ([place])- His blood sugar is adequately well controlled. ?-     HM Diabetes Foot Exam ?-     Basic metabolic panel; Future ?-     Hemoglobin A1c; Future ?-      Hemoglobin A1c ?-     Basic metabolic panel ? ?Generalized abdominal pain- Plain films are negative for obstruction. ?-     DG ABD ACUTE 2+V W 1V CHEST; Future ? ?Constipation, chronic- His labs are negative for secondary causes.

## 2021-08-25 ENCOUNTER — Telehealth: Payer: Self-pay

## 2021-08-25 DIAGNOSIS — C44319 Basal cell carcinoma of skin of other parts of face: Secondary | ICD-10-CM | POA: Diagnosis not present

## 2021-08-25 NOTE — Telephone Encounter (Signed)
Pt is requesting a call back in regards to an xray. ? ?Pt was asked repeatedly what the call was regarding and all pt said was have her to call me please. ? ?Please advise ?

## 2021-08-25 NOTE — Telephone Encounter (Signed)
Pt requested copy of recent xray imaging. I informed him to contact medical records for a copy. He expressed understanding.  ?

## 2021-08-28 DIAGNOSIS — N3041 Irradiation cystitis with hematuria: Secondary | ICD-10-CM | POA: Diagnosis not present

## 2021-08-28 DIAGNOSIS — Z8551 Personal history of malignant neoplasm of bladder: Secondary | ICD-10-CM | POA: Diagnosis not present

## 2021-08-28 DIAGNOSIS — N200 Calculus of kidney: Secondary | ICD-10-CM | POA: Diagnosis not present

## 2021-08-28 DIAGNOSIS — Z8546 Personal history of malignant neoplasm of prostate: Secondary | ICD-10-CM | POA: Diagnosis not present

## 2021-09-09 ENCOUNTER — Other Ambulatory Visit: Payer: Self-pay | Admitting: Internal Medicine

## 2021-09-09 DIAGNOSIS — E785 Hyperlipidemia, unspecified: Secondary | ICD-10-CM

## 2021-09-10 ENCOUNTER — Other Ambulatory Visit (HOSPITAL_COMMUNITY): Payer: Self-pay | Admitting: Cardiology

## 2021-09-11 ENCOUNTER — Emergency Department (HOSPITAL_COMMUNITY)
Admission: EM | Admit: 2021-09-11 | Discharge: 2021-09-11 | Discharge status: Against Medical Advice | Payer: HMO | Attending: Emergency Medicine | Admitting: Emergency Medicine

## 2021-09-11 ENCOUNTER — Encounter (HOSPITAL_COMMUNITY): Payer: Self-pay

## 2021-09-11 ENCOUNTER — Telehealth: Payer: Self-pay | Admitting: Internal Medicine

## 2021-09-11 ENCOUNTER — Emergency Department (HOSPITAL_COMMUNITY): Payer: HMO

## 2021-09-11 DIAGNOSIS — Z5321 Procedure and treatment not carried out due to patient leaving prior to being seen by health care provider: Secondary | ICD-10-CM | POA: Insufficient documentation

## 2021-09-11 DIAGNOSIS — R519 Headache, unspecified: Secondary | ICD-10-CM | POA: Insufficient documentation

## 2021-09-11 DIAGNOSIS — I6381 Other cerebral infarction due to occlusion or stenosis of small artery: Secondary | ICD-10-CM | POA: Diagnosis not present

## 2021-09-11 LAB — CBC WITH DIFFERENTIAL/PLATELET
Abs Immature Granulocytes: 0.01 10*3/uL (ref 0.00–0.07)
Basophils Absolute: 0 10*3/uL (ref 0.0–0.1)
Basophils Relative: 1 %
Eosinophils Absolute: 0.1 10*3/uL (ref 0.0–0.5)
Eosinophils Relative: 2 %
HCT: 44.9 % (ref 39.0–52.0)
Hemoglobin: 14.7 g/dL (ref 13.0–17.0)
Immature Granulocytes: 0 %
Lymphocytes Relative: 24 %
Lymphs Abs: 1 10*3/uL (ref 0.7–4.0)
MCH: 29.3 pg (ref 26.0–34.0)
MCHC: 32.7 g/dL (ref 30.0–36.0)
MCV: 89.6 fL (ref 80.0–100.0)
Monocytes Absolute: 0.5 10*3/uL (ref 0.1–1.0)
Monocytes Relative: 12 %
Neutro Abs: 2.6 10*3/uL (ref 1.7–7.7)
Neutrophils Relative %: 61 %
Platelets: 145 10*3/uL — ABNORMAL LOW (ref 150–400)
RBC: 5.01 MIL/uL (ref 4.22–5.81)
RDW: 14.1 % (ref 11.5–15.5)
WBC: 4.2 10*3/uL (ref 4.0–10.5)
nRBC: 0 % (ref 0.0–0.2)

## 2021-09-11 LAB — BASIC METABOLIC PANEL
Anion gap: 5 (ref 5–15)
BUN: 19 mg/dL (ref 8–23)
CO2: 28 mmol/L (ref 22–32)
Calcium: 9 mg/dL (ref 8.9–10.3)
Chloride: 107 mmol/L (ref 98–111)
Creatinine, Ser: 1.05 mg/dL (ref 0.61–1.24)
GFR, Estimated: >60 mL/min (ref >60)
Glucose, Bld: 120 mg/dL — ABNORMAL HIGH (ref 70–99)
Potassium: 4.4 mmol/L (ref 3.5–5.1)
Sodium: 140 mmol/L (ref 135–145)

## 2021-09-11 NOTE — ED Triage Notes (Signed)
Pt arrived via POV, c/o headache x3 days. Denies any photophobia. States similar headaches x2 years ago. Has been taking OTC with no relief.  ?

## 2021-09-11 NOTE — ED Provider Triage Note (Signed)
Emergency Medicine Provider Triage Evaluation Note ? ?[name] , a 88 y.o. male  was evaluated in triage.  Pt complains of headache onset 3 days.  Patient notes he has had similar headaches in the past notably 2 years ago.  Has been taking over-the-counter medications with no relief in his symptoms.  Denies photophobia, vision changes, rhinorrhea, nasal congestion. ? ?Review of Systems  ?Positive: As per HPI above ?Negative:  ? ?Physical Exam  ?BP (!) 166/67 (BP Location: Left Arm)   Pulse (!) 52   Temp 97.9 ?F (36.6 ?C) (Oral)   Resp 18   SpO2 96%  ?Gen:   Awake, no distress   ?Resp:  Normal effort  ?MSK:   Moves extremities without difficulty  ?Other:  No spinal tenderness to palpation.  Negative pronator drift.  No focal neurological deficits. ? ?Medical Decision Making  ?Medically screening exam initiated at 9:32 AM.  Appropriate orders placed.  [name] was informed that the remainder of the evaluation will be completed by another provider, this initial triage assessment does not replace that evaluation, and the importance of remaining in the ED until their evaluation is complete. ?  ?Lannie Heaps A, PA-C ?09/11/21 0940 ? ?

## 2021-09-11 NOTE — Telephone Encounter (Signed)
Pts daughter states pt has had a severe headache since 9-14, she states she is currently at ed w/ pt and pt is requesting to schedule an appt w/ another provider other than current provider for this wk ? ?Advised her I can schedule an appt w/ the NP but is hospital fu is needed, appt will have to be w/ current provider and appt w/ NP may be cx ? ?Daughter verbalized understanding and agreed ? ? ?

## 2021-09-13 ENCOUNTER — Encounter: Payer: Self-pay | Admitting: Family Medicine

## 2021-09-13 ENCOUNTER — Ambulatory Visit (INDEPENDENT_AMBULATORY_CARE_PROVIDER_SITE_OTHER): Payer: HMO | Admitting: Family Medicine

## 2021-09-13 VITALS — BP 124/80 | HR 62 | Temp 97.6°F | Ht 68.0 in | Wt 177.8 lb

## 2021-09-13 DIAGNOSIS — R519 Headache, unspecified: Secondary | ICD-10-CM

## 2021-09-13 DIAGNOSIS — H02055 Trichiasis without entropian left lower eyelid: Secondary | ICD-10-CM | POA: Diagnosis not present

## 2021-09-13 NOTE — Patient Instructions (Signed)
If your headache returns, you may take Tylenol 500 mg or 1,000 mg every 8 hours for pain if needed.  ?If you notice any stroke-like symptoms such as facial weakness, vision changes, difficulty speaking, arm or leg weakness then you should call 911.  ? ?Make sure you are staying well hydrated since dehydration can be a trigger for headaches.  ? ? ?General Headache Without Cause ?A headache is pain or discomfort felt around the head or neck area. There are many causes and types of headaches. A few common types include: ?Tension headaches. ?Migraine headaches. ?Cluster headaches. ?Chronic daily headaches. ?Sometimes, the specific cause of a headache may not be found. ?Follow these instructions at home: ?Watch your condition for any changes. Let your health care provider know about them. Take these steps to help with your condition: ?Managing pain ? ?  ? ?Take over-the-counter and prescription medicines only as told by your health care provider. Treatment may include medicines for pain that are taken by mouth or applied to the skin. ?Lie down in a dark, quiet room when you have a headache. ?Keep lights dim if bright lights bother you or make your headaches worse. ?If directed, put ice on your head and neck area: ?Put ice in a plastic bag. ?Place a towel between your skin and the bag. ?Leave the ice on for 20 minutes, 2-3 times per day. ?Remove the ice if your skin turns bright red. This is very important. If you cannot feel pain, heat, or cold, you have a greater risk of damage to the area. ?If directed, apply heat to the affected area. Use the heat source that your health care provider recommends, such as a moist heat pack or a heating pad. ?Place a towel between your skin and the heat source. ?Leave the heat on for 20-30 minutes. ?Remove the heat if your skin turns bright red. This is especially important if you are unable to feel pain, heat, or cold. You have a greater risk of getting burned. ?Eating and  drinking ?Eat meals on a regular schedule. ?If you drink alcohol: ?Limit how much you have to: ?0-1 drink a day for women who are not pregnant. ?0-2 drinks a day for men. ?Know how much alcohol is in a drink. In the [place] one drink equals one 12 oz bottle of beer (355 mL), one 5 oz glass of wine (148 mL), or one 1? oz glass of hard liquor (44 mL). ?Stop drinking caffeine, or decrease the amount of caffeine you drink. ?Drink enough fluid to keep your urine pale yellow. ?General instructions ? ?Keep a headache journal to help find out what may trigger your headaches. For example, write down: ?What you eat and drink. ?How much sleep you get. ?Any change to your diet or medicines. ?Try massage or other relaxation techniques. ?Limit stress. ?Sit up straight, and do not tense your muscles. ?Do not use any products that contain nicotine or tobacco. These products include cigarettes, chewing tobacco, and vaping devices, such as e-cigarettes. If you need help quitting, ask your health care provider. ?Exercise regularly as told by your health care provider. ?Sleep on a regular schedule. Get 7-9 hours of sleep each night, or the amount recommended by your health care provider. ?Keep all follow-up visits. This is important. ?Contact a health care provider if: ?Medicine does not help your symptoms. ?You have a headache that is different from your usual headache. ?You have nausea or you vomit. ?You have a fever. ?Get help right away  if: ?Your headache: ?Becomes severe quickly. ?Gets worse after moderate to intense physical activity. ?You have any of these symptoms: ?Repeated vomiting. ?Pain or stiffness in your neck. ?Changes to your vision. ?Pain in an eye or ear. ?Problems with speech. ?Muscular weakness or loss of muscle control. ?Loss of balance or coordination. ?You feel faint or pass out. ?You have confusion. ?You have a seizure. ?These symptoms may represent a serious problem that is an emergency. Do not wait to see if the  symptoms will go away. Get medical help right away. Call your local emergency services (911 in the [place] Do not drive yourself to the hospital. ?Summary ?A headache is pain or discomfort felt around the head or neck area. ?There are many causes and types of headaches. In some cases, the cause may not be found. ?Keep a headache journal to help find out what may trigger your headaches. Watch your condition for any changes. Let your health care provider know about them. ?Contact a health care provider if you have a headache that is different from the usual headache, or if your symptoms are not helped by medicine. ?Get help right away if your headache becomes severe, you vomit, you have a loss of vision, you lose your balance, or you have a seizure. ?This information is not intended to replace advice given to you by your health care provider. Make sure you discuss any questions you have with your health care provider. ?Document Revised: 10/12/2020 Document Reviewed: 10/12/2020 ?Elsevier Patient Education ? [place]. ? ?

## 2021-09-13 NOTE — Progress Notes (Signed)
? ?Subjective:  ? ? ? Patient ID: Dillon Young, male    DOB: September 12, 1932, 88 y.o.   MRN: [id number] ? ?Chief Complaint  ?Patient presents with  ? Headache  ?  Since 4/14. Pain rated a 10. Went to ED but was only given a ice pack but has also been taking Tylenol.   ? ? ?HPI ?Patient is in today for resolved acute headache x 3 days. States pain was 10/10. He went to the ED for evaluation. States he left before getting results of his CT but then he was able to see them on his mychart portal.  States headache has been intermittent over the past 3 days and has resolved as of this morning. States the pain interferes with his sleep.  ?States he was given an opioid pain medication a couple of years ago and it was good for his headaches.  ?Requests a refill of the pain medication to have on hand in case of recurrent headache.  ?  ?Taking Tylenol and Zyrtec and Allegra. States he used an ice pack on his head and it helped with his pain.  ? ?Denies fever, chills, dizziness, vision changes, chest pain, palpitations, shortness of breath, abdominal pain, N/V/D, urinary symptoms, LE edema. No falls. No numbness, tingling or weakness.  ? ? ? ? ?Health Maintenance Due  ?Topic Date Due  ? OPHTHALMOLOGY EXAM  08/15/2021  ? ? ?Past Medical History:  ?Diagnosis Date  ? Adenomatous colon polyp   ? Allergic rhinitis   ? Anemia   ? Anxiety   ? Bladder cancer ([place])   ? BPH (benign prostatic hyperplasia)   ? CAD (coronary artery disease)   ? sees Dr. [name]   ? Carotid artery disease ([place])   ? CHF (congestive heart failure) ([place])   ? Chronic kidney disease   ? Sones. Prostate cancer  ? Clotting disorder ([place])   ? CVA (cerebral infarction)   ? Diabetes mellitus   ? "Borderline"  ? Diverticulitis   ? Dysrhythmia   ? atrial fib - hx of   ? Edema   ? recent swelling in feet and ankles   ? GERD (gastroesophageal reflux disease)   ? history  ? Headache   ? Hemorrhoids   ? History of kidney stones   ? HTN (hypertension)   ? Hx of  radiation therapy   ? for prostate - 2013   ? Hyperlipidemia LDL goal <70   ? IBS (irritable bowel syndrome)   ? Low back pain   ? Pre-diabetes   ? Prostate cancer [facility]) 2008  ? seed implantation  ? Sleep apnea   ? sitting in recliner helps him to breathe per pt , never used cpap sleep study - pt reports could not go to sleep- 6 years ago   ? Smoker   ? Stroke [facility])   ? hx of mini strokes years ago   ? ? ?Past Surgical History:  ?Procedure Laterality Date  ? APPENDECTOMY    ? CARDIAC CATHETERIZATION N/A 01/16/2016  ? Procedure: Left Heart Cath and Coronary Angiography;  Surgeon: Jettie Booze, MD;  Location: [place] [facility];  Service: Cardiovascular;  Laterality: N/A;  ? CARDIAC CATHETERIZATION N/A 01/16/2016  ? Procedure: Coronary Stent Intervention;  Surgeon: Jettie Booze, MD;  Location: [place] [facility];  Service: Cardiovascular;  Laterality: N/A;  ? CARDIOVERSION N/A 11/26/2017  ? Procedure: CARDIOVERSION;  Surgeon: [name], MD;  Location: [place];  Service: Cardiovascular;  Laterality: N/A;  ? CARDIOVERSION N/A 03/13/2018  ? Procedure: CARDIOVERSION;  Surgeon: [name], MD;  Location: [facility] ENDOSCOPY;  Service: Cardiovascular;  Laterality: N/A;  ? CATARACT EXTRACTION W/ INTRAOCULAR LENS  IMPLANT, BILATERAL Bilateral   ? COLONOSCOPY  10/21/2008  ? diverticulosis, radiation proctitis, internal hemorrhoids  ? CORONARY ANGIOPLASTY    ? CORONARY STENT INTERVENTION N/A 12/26/2017  ? Procedure: CORONARY STENT INTERVENTION;  Surgeon: [name], MD;  Location: [place] [facility];  Service: Cardiovascular;  Laterality: N/A;  ? ENDARTERECTOMY  11/12/2011  ? Procedure: ENDARTERECTOMY CAROTID;  Surgeon: Rosetta Posner, MD;  Location: [facility] OR;  Service: Vascular;  Laterality: Right;  ? ESOPHAGOGASTRODUODENOSCOPY (EGD) WITH ESOPHAGEAL DILATION    ? HEMORRHOID BANDING    ? LEFT HEART CATH AND CORONARY ANGIOGRAPHY N/A 12/26/2017  ? Procedure: LEFT HEART CATH AND CORONARY ANGIOGRAPHY;  Surgeon:  [name], MD;  Location: [place] [facility];  Service: Cardiovascular;  Laterality: N/A;  ? LITHOTRIPSY  X 3  ? LUMBAR LAMINECTOMY    ? TRANSURETHRAL RESECTION OF BLADDER TUMOR WITH MITOMYCIN-C Bilateral 01/06/2021  ? Procedure: TRANSURETHRAL RESECTION OF BLADDER TUMOR WITH GEMCITABINE BILATERAL RETROGRADES;  Surgeon: Irine Seal, MD;  Location: [facility];  Service: Urology;  Laterality: Bilateral;  ? TRANSURETHRAL RESECTION OF PROSTATE  10/2003  ? TRANSURETHRAL RESECTION OF PROSTATE N/A 05/31/2020  ? Procedure: CYSTOSCOPY TRANSURETHRAL RESECTION BIOPSY AND FULGURATION OF PROSTATE LESION  (TURP);  Surgeon: Irine Seal, MD;  Location: [facility];  Service: Urology;  Laterality: N/A;  ? ? ?Family History  ?Problem Relation Age of Onset  ? Heart disease Father   ? Hypertension Mother   ? Asthma Paternal Grandmother   ? Colon cancer Neg Hx   ? Esophageal cancer Neg Hx   ? Stomach cancer Neg Hx   ? Rectal cancer Neg Hx   ? Liver cancer Neg Hx   ? ? ?Social History  ? ?Socioeconomic History  ? Marital status: Married  ?  Spouse name: Not on file  ? Number of children: 2  ? Years of education: Not on file  ? Highest education level: Not on file  ?Occupational History  ? 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 Smoking status: Former  ?  Packs/day: 1.50  ?  Years: 60.00  ?  Pack years: 90.00  ?  Types: Cigarettes  ?  Quit date: 07/25/2008  ?  Years since quitting: 13.1  ? Smokeless tobacco: Never  ?Vaping Use  ? Vaping Use: Never used  ?Substance and Sexual Activity  ? Alcohol use: Not Currently  ?  Comment: occassionally  ? Drug use: No  ? Sexual activity: Not Currently  ?Other Topics Concern  ? Not on file  ?Social History Narrative  ? Married second time  ? Retired [employer] n39 yrs  ? 2 daughters and 2 stepdaughters  ? ?Social Determinants of Health  ? ?Financial Resource Strain: Low Risk   ? Difficulty of Paying Living Expenses: Not hard at all  ?Food Insecurity: Not on file  ?Transportation  Needs: No Transportation Needs  ? Lack of Transportation (Medical): No  ? Lack of Transportation (Non-Medical): No  ?Physical Activity: Sufficiently Active  ? Days of Exercise per Week: 5 days  ? Minutes of Exercise per Session: 30 min  ?Stress: No Stress Concern Present  ? Feeling of Stress : Not at all  ?Social Connections: Socially Integrated  ? Frequency of Communication with Friends and Family: More  than three times a week  ? Frequency of Social Gatherings with Friends and Family: More than three times a week  ? Attends Religious Services: More than 4 times per year  ? Active Member of Clubs or Organizations: Yes  ? Attends Archivist Meetings: More than 4 times per year  ? Marital Status: Married  ?Intimate Partner Violence: Not on file  ? ? ?Outpatient Medications Prior to Visit  ?Medication Sig Dispense Refill  ? acetaminophen (TYLENOL) 500 MG tablet Take 500 mg by mouth every 6 (six) hours as needed for mild pain or headache.    ? albuterol (VENTOLIN HFA) 108 (90 Base) MCG/ACT inhaler Inhale 2 puffs into the lungs every 6 (six) hours as needed for wheezing or shortness of breath. 1 each 3  ? carvedilol (COREG) 3.125 MG tablet TAKE 1 TABLET BY MOUTH 2 (TWO) TIMES DAILY WITH A MEAL. NEEDS APPOINTMENT FOR MORE REFILLS 60 tablet 0  ? carvedilol (COREG) 6.25 MG tablet Take 1.5 tablets (9.375 mg total) by mouth in the morning and at bedtime. NEEDS FOLLOW UP APPOINTMENT FOR MORE REFILLS 60 tablet 0  ? Cholecalciferol (VITAMIN D-3) 25 MCG (1000 UT) CAPS Take 1,000 Units by mouth daily.    ? dofetilide (TIKOSYN) 125 MCG capsule TAKE 1 CAPSULE BY MOUTH TWICE DAILY 180 capsule 3  ? ELIQUIS 5 MG TABS tablet TAKE 1 TABLET BY MOUTH TWICE A DAY 180 tablet 1  ? Ferric Maltol (ACCRUFER) 30 MG CAPS Take 1 capsule by mouth in the morning and at bedtime. 180 capsule 1  ? fexofenadine (ALLEGRA) 180 MG tablet Take 180 mg by mouth daily as needed for allergies or rhinitis.     ? fluticasone (FLONASE) 50 MCG/ACT nasal  spray Place 1 spray into both nostrils daily.    ? hydrocortisone (ANUSOL-HC) 2.5 % rectal cream Place 1 application rectally 3 (three) times daily as needed for hemorrhoids. 30 g 1  ? Hypromellose (ARTIFICIAL TEARS O

## 2021-09-18 ENCOUNTER — Other Ambulatory Visit (HOSPITAL_COMMUNITY): Payer: Self-pay | Admitting: Cardiology

## 2021-09-18 DIAGNOSIS — L570 Actinic keratosis: Secondary | ICD-10-CM | POA: Diagnosis not present

## 2021-09-18 DIAGNOSIS — C44329 Squamous cell carcinoma of skin of other parts of face: Secondary | ICD-10-CM | POA: Diagnosis not present

## 2021-09-18 DIAGNOSIS — X32XXXD Exposure to sunlight, subsequent encounter: Secondary | ICD-10-CM | POA: Diagnosis not present

## 2021-09-25 ENCOUNTER — Ambulatory Visit (INDEPENDENT_AMBULATORY_CARE_PROVIDER_SITE_OTHER): Payer: HMO

## 2021-09-25 VITALS — Ht 68.0 in | Wt 177.0 lb

## 2021-09-25 DIAGNOSIS — Z0000 Encounter for general adult medical examination without abnormal findings: Secondary | ICD-10-CM | POA: Diagnosis not present

## 2021-09-25 NOTE — Progress Notes (Signed)
? ?Subjective:  ? Dillon Young is a 88 y.o. male who presents for Medicare Annual/Subsequent preventive examination. ? ?Review of Systems    ?Virtual Visit via Telephone Note ? ?I connected with  Dillon Young on 09/25/21 at 10:45 AM EDT by telephone and verified that I am speaking with the correct person using two identifiers. ? ?Location: ?Patient: Home ?Provider: Office ?Persons participating in the virtual visit: patient/Nurse Health Advisor ?  ?I discussed the limitations, risks, security and privacy concerns of performing an evaluation and management service by telephone and the availability of in person appointments. The patient expressed understanding and agreed to proceed. ? ?Interactive audio and video telecommunications were attempted between this nurse and patient, however failed, due to patient having technical difficulties OR patient did not have access to video capability.  We continued and completed visit with audio only. ? ?Some vital signs may be absent or patient reported.  ? ?Dillon Peaches, LPN  ?Cardiac Risk Factors include: advanced age (>24mn, >>64women);hypertension;male gender ? ?   ?Objective:  ?  ?Today's Vitals  ? 09/25/21 1131  ?Weight: 177 lb (80.3 kg)  ?Height: '5\' 8"'$  (1.727 m)  ? ?Body mass index is 26.91 kg/m?. ? ? ?  09/25/2021  ? 11:40 AM 12/28/2020  ?  1:09 PM 09/15/2020  ?  4:26 PM 05/31/2020  ?  8:54 AM 05/23/2020  ?  3:48 PM 05/05/2020  ?  8:53 AM 12/31/2019  ?  3:21 PM  ?Advanced Directives  ?Does Patient Have a Medical Advance Directive? Yes Yes Yes Yes Yes Yes No  ?Type of [place] will [place] will Living will;Healthcare Power of [place]Living will [place] will [place] will   ?Does patient want to make changes to medical advance directive? No - Patient declined  No - Patient declined      ?Copy of [place] Chart? No - copy requested No - copy requested No - copy requested   Yes - validated most recent copy scanned in chart (See row information)   ?Would patient like information on creating a medical advance directive?       No - Patient declined  ? ? ?Current Medications (verified) ?Outpatient Encounter Medications as of 09/25/2021  ?Medication Sig  ? acetaminophen (TYLENOL) 500 MG tablet Take 500 mg by mouth every 6 (six) hours as needed for mild pain or headache.  ? albuterol (VENTOLIN HFA) 108 (90 Base) MCG/ACT inhaler Inhale 2 puffs into the lungs every 6 (six) hours as needed for wheezing or shortness of breath.  ? carvedilol (COREG) 3.125 MG tablet Take 1 tablet (3.125 mg total) by mouth 2 (two) times daily with a meal. Last refill without office visit please call [phone] schedule  ? carvedilol (COREG) 6.25 MG tablet Take 1.5 tablets (9.375 mg total) by mouth in the morning and at bedtime. NEEDS FOLLOW UP APPOINTMENT FOR MORE REFILLS  ? Cholecalciferol (VITAMIN D-3) 25 MCG (1000 UT) CAPS Take 1,000 Units by mouth daily.  ? dofetilide (TIKOSYN) 125 MCG capsule TAKE 1 CAPSULE BY MOUTH TWICE DAILY  ? ELIQUIS 5 MG TABS tablet TAKE 1 TABLET BY MOUTH TWICE A DAY  ? Ferric Maltol (ACCRUFER) 30 MG CAPS Take 1 capsule by mouth in the morning and at bedtime.  ? fexofenadine (ALLEGRA) 180 MG tablet Take 180 mg by mouth daily as needed for allergies or rhinitis.   ?  fluticasone (FLONASE) 50 MCG/ACT nasal spray Place 1 spray into both nostrils daily.  ? hydrocortisone (ANUSOL-HC) 2.5 % rectal cream Place 1 application rectally 3 (three) times daily as needed for hemorrhoids.  ? Hypromellose (ARTIFICIAL TEARS OP) Place 1 drop into both eyes 4 (four) times daily as needed (dry eyes).  ? ipratropium (ATROVENT) 0.06 % nasal spray Place 2 sprays into both nostrils 4 (four) times daily as needed for rhinitis.  ? levocetirizine (XYZAL) 5 MG tablet Take 1 tablet (5 mg total) by mouth every evening.  ?  linaclotide (LINZESS) 145 MCG CAPS capsule Take 1 capsule (145 mcg total) by mouth daily before breakfast.  ? nitroGLYCERIN (NITROSTAT) 0.4 MG SL tablet Place 1 tablet (0.4 mg total) under the tongue every 5 (five) minutes as needed for chest pain.  ? rosuvastatin (CRESTOR) 10 MG tablet TAKE 1 TABLET BY MOUTH EVERY DAY  ? sacubitril-valsartan (ENTRESTO) 97-103 MG Take 1 tablet by mouth 2 (two) times daily.  ? spironolactone (ALDACTONE) 25 MG tablet Take 2 tablets (50 mg total) by mouth daily.  ? Tiotropium Bromide Monohydrate (SPIRIVA RESPIMAT) 2.5 MCG/ACT AERS Inhale 2 puffs into the lungs daily.  ? triamcinolone cream (KENALOG) 0.1 % Apply 1 application topically daily as needed (to affected areas, for itching).   ? Wheat Dextrin (BENEFIBER DRINK MIX PO) Take 2 Scoops by mouth daily in the afternoon. Take 2 tablespoons daily  ? ?No facility-administered encounter medications on file as of 09/25/2021.  ? ? ?Allergies (verified) ?Lipitor [atorvastatin] and Other  ? ?History: ?Past Medical History:  ?Diagnosis Date  ? Adenomatous colon polyp   ? Allergic rhinitis   ? Anemia   ? Anxiety   ? Bladder cancer ([place])   ? BPH (benign prostatic hyperplasia)   ? CAD (coronary artery disease)   ? sees Dr. [name]   ? Carotid artery disease ([place])   ? CHF (congestive heart failure) ([place])   ? Chronic kidney disease   ? Sones. Prostate cancer  ? Clotting disorder ([place])   ? CVA (cerebral infarction)   ? Diabetes mellitus   ? "Borderline"  ? Diverticulitis   ? Dysrhythmia   ? atrial fib - hx of   ? Edema   ? recent swelling in feet and ankles   ? GERD (gastroesophageal reflux disease)   ? history  ? Headache   ? Hemorrhoids   ? History of kidney stones   ? HTN (hypertension)   ? Hx of radiation therapy   ? for prostate - 2013   ? Hyperlipidemia LDL goal <70   ? IBS (irritable bowel syndrome)   ? Low back pain   ? Pre-diabetes   ? Prostate cancer [facility]) 2008  ? seed implantation  ? Sleep apnea   ? sitting in recliner helps him to  breathe per pt , never used cpap sleep study - pt reports could not go to sleep- 6 years ago   ? Smoker   ? Stroke [facility])   ? hx of mini strokes years ago   ? ?Past Surgical History:  ?Procedure Laterality Date  ? APPENDECTOMY    ? CARDIAC CATHETERIZATION N/A 01/16/2016  ? Procedure: Left Heart Cath and Coronary Angiography;  Surgeon: Jettie Booze, MD;  Location: [facility];  Service: Cardiovascular;  Laterality: N/A;  ? CARDIAC CATHETERIZATION N/A 01/16/2016  ? Procedure: Coronary Stent Intervention;  Surgeon: Jettie Booze, MD;  Location: [place] [facility];  Service: Cardiovascular;  Laterality: N/A;  ?  CARDIOVERSION N/A 11/26/2017  ? Procedure: CARDIOVERSION;  Surgeon: [name], MD;  Location: [facility] ENDOSCOPY;  Service: Cardiovascular;  Laterality: N/A;  ? CARDIOVERSION N/A 03/13/2018  ? Procedure: CARDIOVERSION;  Surgeon: [name], MD;  Location[facility] ENDOSCOPY;  Service: Cardiovascular;  Laterality: N/A;  ? CATARACT EXTRACTION W/ INTRAOCULAR LENS  IMPLANT, BILATERAL Bilateral   ? COLONOSCOPY  10/21/2008  ? diverticulosis, radiation proctitis, internal hemorrhoids  ? CORONARY ANGIOPLASTY    ? CORONARY STENT INTERVENTION N/A 12/26/2017  ? Procedure: CORONARY STENT INTERVENTION;  Surgeon: [name], MD;  Location: [facility];  Service: Cardiovascular;  Laterality: N/A;  ? ENDARTERECTOMY  11/12/2011  ? Procedure: ENDARTERECTOMY CAROTID;  Surgeon: Rosetta Posner, MD;  Location: [facility] OR;  Service: Vascular;  Laterality: Right;  ? ESOPHAGOGASTRODUODENOSCOPY (EGD) WITH ESOPHAGEAL DILATION    ? HEMORRHOID BANDING    ? LEFT HEART CATH AND CORONARY ANGIOGRAPHY N/A 12/26/2017  ? Procedure: LEFT HEART CATH AND CORONARY ANGIOGRAPHY;  Surgeon: [name], MD;  Location: [place] [facility] LAB;  Service: Cardiovascular;  Laterality: N/A;  ? LITHOTRIPSY  X 3  ? LUMBAR LAMINECTOMY    ? TRANSURETHRAL RESECTION OF BLADDER TUMOR WITH MITOMYCIN-C Bilateral 01/06/2021  ? Procedure: TRANSURETHRAL  RESECTION OF BLADDER TUMOR WITH GEMCITABINE BILATERAL RETROGRADES;  Surgeon: Irine Seal, MD;  Location: [facility] ORS;  Service: Urology;  Laterality: Bilateral;  ? TRANSURETHRAL RESECTION OF PROSTATE  10/2003  ? TRANSUR

## 2021-09-25 NOTE — Patient Instructions (Addendum)
?Mr. Dillon Young , ?Thank you for taking time to come for your Medicare Wellness Visit. I appreciate your ongoing commitment to your health goals. Please review the following plan we discussed and let me know if I can assist you in the future.  ? ?These are the goals we discussed: ? Goals   ? ?   Manage My Medicine   ?   Timeframe:  Long-Range Goal ?Priority:  Medium ?Start Date:      10/03/20                       ?Expected End Date: 10/03/21                      ? ?Follow Up Date May 2023 ?  ?- call for medicine refill 2 or 3 days before it runs out ?- call if I am sick and can't take my medicine ?- keep a list of all the medicines I take; vitamins and herbals too ?- use a pillbox to sort medicine  ?  ?Why is this important?   ?These steps will help you keep on track with your medicines. ?  ?Notes:  ?  ?   Stay Healthy (pt-stated)   ?   Try to stay healthy. ?  ? ?  ?  ?This is a list of the screening recommended for you and due dates:  ?Health Maintenance  ?Topic Date Due  ? Eye exam for diabetics  08/15/2021  ? COVID-19 Vaccine (4 - Booster for Pfizer series) 09/29/2021*  ? Flu Shot  12/26/2021  ? Tetanus Vaccine  05/06/2022  ? Complete foot exam   08/25/2022  ? Pneumonia Vaccine  Completed  ? Zoster (Shingles) Vaccine  Completed  ? HPV Vaccine  Aged Out  ?*Topic was postponed. The date shown is not the original due date.  ? ?Advanced directives: Yes  ? ?Conditions/risks identified: None ? ?Next appointment: Follow up in one year for your annual wellness visit.  ? ?Preventive Care 40 Years and Older, Male ?Preventive care refers to lifestyle choices and visits with your health care provider that can promote health and wellness. ?What does preventive care include? ?A yearly physical exam. This is also called an annual well check. ?Dental exams once or twice a year. ?Routine eye exams. Ask your health care provider how often you should have your eyes checked. ?Personal lifestyle choices, including: ?Daily care of your  teeth and gums. ?Regular physical activity. ?Eating a healthy diet. ?Avoiding tobacco and drug use. ?Limiting alcohol use. ?Practicing safe sex. ?Taking low doses of aspirin every day. ?Taking vitamin and mineral supplements as recommended by your health care provider. ?What happens during an annual well check? ?The services and screenings done by your health care provider during your annual well check will depend on your age, overall health, lifestyle risk factors, and family history of disease. ?Counseling  ?Your health care provider may ask you questions about your: ?Alcohol use. ?Tobacco use. ?Drug use. ?Emotional well-being. ?Home and relationship well-being. ?Sexual activity. ?Eating habits. ?History of falls. ?Memory and ability to understand (cognition). ?Work and work Statistician. ?Screening  ?You may have the following tests or measurements: ?Height, weight, and BMI. ?Blood pressure. ?Lipid and cholesterol levels. These may be checked every 5 years, or more frequently if you are over 42 years old. ?Skin check. ?Lung cancer screening. You may have this screening every year starting at age 82 if you have a 30-pack-year history of  smoking and currently smoke or have quit within the past 15 years. ?Fecal occult blood test (FOBT) of the stool. You may have this test every year starting at age 40. ?Flexible sigmoidoscopy or colonoscopy. You may have a sigmoidoscopy every 5 years or a colonoscopy every 10 years starting at age 28. ?Prostate cancer screening. Recommendations will vary depending on your family history and other risks. ?Hepatitis C blood test. ?Hepatitis B blood test. ?Sexually transmitted disease (STD) testing. ?Diabetes screening. This is done by checking your blood sugar (glucose) after you have not eaten for a while (fasting). You may have this done every 1-3 years. ?Abdominal aortic aneurysm (AAA) screening. You may need this if you are a current or former smoker. ?Osteoporosis. You may be  screened starting at age 4 if you are at high risk. ?Talk with your health care provider about your test results, treatment options, and if necessary, the need for more tests. ?Vaccines  ?Your health care provider may recommend certain vaccines, such as: ?Influenza vaccine. This is recommended every year. ?Tetanus, diphtheria, and acellular pertussis (Tdap, Td) vaccine. You may need a Td booster every 10 years. ?Zoster vaccine. You may need this after age 21. ?Pneumococcal 13-valent conjugate (PCV13) vaccine. One dose is recommended after age 32. ?Pneumococcal polysaccharide (PPSV23) vaccine. One dose is recommended after age 41. ?Talk to your health care provider about which screenings and vaccines you need and how often you need them. ?This information is not intended to replace advice given to you by your health care provider. Make sure you discuss any questions you have with your health care provider. ?Document Released: 06/10/2015 Document Revised: 02/01/2016 Document Reviewed: 03/15/2015 ?Elsevier Interactive Patient Education ? 2017 [place]. ? ?Fall Prevention in the Home ?Falls can cause injuries. They can happen to people of all ages. There are many things you can do to make your home safe and to help prevent falls. ?What can I do on the outside of my home? ?Regularly fix the edges of walkways and driveways and fix any cracks. ?Remove anything that might make you trip as you walk through a door, such as a raised step or threshold. ?Trim any bushes or trees on the path to your home. ?Use bright outdoor lighting. ?Clear any walking paths of anything that might make someone trip, such as rocks or tools. ?Regularly check to see if handrails are loose or broken. Make sure that both sides of any steps have handrails. ?Any raised decks and porches should have guardrails on the edges. ?Have any leaves, snow, or ice cleared regularly. ?Use sand or salt on walking paths during winter. ?Clean up any spills in  your garage right away. This includes oil or grease spills. ?What can I do in the bathroom? ?Use night lights. ?Install grab bars by the toilet and in the tub and shower. Do not use towel bars as grab bars. ?Use non-skid mats or decals in the tub or shower. ?If you need to sit down in the shower, use a plastic, non-slip stool. ?Keep the floor dry. Clean up any water that spills on the floor as soon as it happens. ?Remove soap buildup in the tub or shower regularly. ?Attach bath mats securely with double-sided non-slip rug tape. ?Do not have throw rugs and other things on the floor that can make you trip. ?What can I do in the bedroom? ?Use night lights. ?Make sure that you have a light by your bed that is easy to reach. ?Do not use  any sheets or blankets that are too big for your bed. They should not hang down onto the floor. ?Have a firm chair that has side arms. You can use this for support while you get dressed. ?Do not have throw rugs and other things on the floor that can make you trip. ?What can I do in the kitchen? ?Clean up any spills right away. ?Avoid walking on wet floors. ?Keep items that you use a lot in easy-to-reach places. ?If you need to reach something above you, use a strong step stool that has a grab bar. ?Keep electrical cords out of the way. ?Do not use floor polish or wax that makes floors slippery. If you must use wax, use non-skid floor wax. ?Do not have throw rugs and other things on the floor that can make you trip. ?What can I do with my stairs? ?Do not leave any items on the stairs. ?Make sure that there are handrails on both sides of the stairs and use them. Fix handrails that are broken or loose. Make sure that handrails are as long as the stairways. ?Check any carpeting to make sure that it is firmly attached to the stairs. Fix any carpet that is loose or worn. ?Avoid having throw rugs at the top or bottom of the stairs. If you do have throw rugs, attach them to the floor with carpet  tape. ?Make sure that you have a light switch at the top of the stairs and the bottom of the stairs. If you do not have them, ask someone to add them for you. ?What else can I do to help prevent falls? ?Wear sh

## 2021-09-28 ENCOUNTER — Ambulatory Visit (INDEPENDENT_AMBULATORY_CARE_PROVIDER_SITE_OTHER): Payer: HMO

## 2021-09-28 ENCOUNTER — Other Ambulatory Visit: Payer: Self-pay | Admitting: Internal Medicine

## 2021-09-28 DIAGNOSIS — I4819 Other persistent atrial fibrillation: Secondary | ICD-10-CM

## 2021-09-28 DIAGNOSIS — I2510 Atherosclerotic heart disease of native coronary artery without angina pectoris: Secondary | ICD-10-CM

## 2021-09-28 DIAGNOSIS — E785 Hyperlipidemia, unspecified: Secondary | ICD-10-CM

## 2021-09-28 DIAGNOSIS — I10 Essential (primary) hypertension: Secondary | ICD-10-CM

## 2021-09-28 DIAGNOSIS — D508 Other iron deficiency anemias: Secondary | ICD-10-CM

## 2021-09-28 MED ORDER — ACCRUFER 30 MG PO CAPS: 1.0000 | ORAL_CAPSULE | Freq: Two times a day (BID) | ORAL | 0 refills | Status: DC

## 2021-09-28 NOTE — Progress Notes (Signed)
? ?Chronic Care Management ?Pharmacy Note ? ?09/28/2021 ?Name:  Dillon Young MRN:  [id number] DOB:  01-29-1933 ? ?Summary: ?-Patient endorses compliance to current medications  ?-Notes that currently he is taking coreg 6.23m - has two different strength of the medication - previously had been taking 9.3748mBID - pt needs appointment with Dr. McClaris Gladdenffice for additional refills  ?-BP at home well controlled - averaging 114-120/60-70's - notes heart rate is normal for him averaging 50-60's bpm ?-Denies any issues with current medications  ?-reports that since starting linzess BM have been more regular ? ?Recommendations/Changes made from today's visit: ?-Recommending no changes to medications, advised for patient to reach out to Dr. McClaris Gladdenffice for appointment as he is overdue  ? ? ?Subjective: ?Dillon ABELLOs an [age].o. year old male who is a primary patient of Dillon LimaMD.  The CCM team was consulted for assistance with disease management and care coordination needs.   ? ?Engaged with patient by telephone for follow up visit in response to provider referral for pharmacy case management and/or care coordination services.  ? ?Consent to Services:  ?The patient was given information about Chronic Care Management services, agreed to services, and gave verbal consent prior to initiation of services.  Please see initial visit note for detailed documentation.  ? ?Patient Care Team: ?Dillon LimaMD as PCP - General (Internal Medicine) ?Dillon HarpMD as PCP - Cardiology (Cardiology) ?Dillon BlaseRPUniversity Hospitals Rehabilitation HospitalPharmacist) ? ?Recent office visits: ?09/13/2021 - Dillon DingwallP - intermittent headache APAP prn for HA should they return  ?08/24/2021 - Dr. JoRonnald Young chronic constipation - started linzess - f/u in 3 months ?06/26/2021 - Dr. JoRonnald Young allergic rhinitis  ?06/07/2021 - Dr. JoRonnald Young no changes to medications - referred to pulmonology to address chronic cough / emphysema  ?05/31/2021 - Dr. JoJenny Young  COVID positive - molnupiravir rx'd - lomotil rx'd  ?05/11/2022 - Dr. SaMitchel Young Acute asthmatic bronchitis - albuterol, augmentin, prednisone rx;d  ? ?Recent consult visits: ?07/28/2021 - Dr. DeErin Young pulmonology - spiriva respimat rx'd - f/u in 6 months  ?06/30/2021 - Dr. DeErin Young Pulmonology - spirivia respimat rx'd  ?06/21/2021 - Dr. BeGwenlyn Young Cardiology - no changes to medications - carotid duplex ordered to be completed in may  ? ?Hospital visits: ?09/11/2021 - ER visit - HA - onset x 3 days - no acute findings on imaging -  ? ?Objective: ? ?Lab Results  ?Component Value Date  ? CREATININE 1.05 09/11/2021  ? BUN 19 09/11/2021  ? GFR 62.58 08/24/2021  ? GFRNONAA >60 09/11/2021  ? GFRAA 68 02/05/2020  ? NA 140 09/11/2021  ? K 4.4 09/11/2021  ? CALCIUM 9.0 09/11/2021  ? CO2 28 09/11/2021  ? GLUCOSE 120 (H) 09/11/2021  ? ? ?Lab Results  ?Component Value Date/Time  ? HGBA1C 6.4 08/24/2021 10:33 AM  ? HGBA1C 6.5 06/07/2021 09:14 AM  ? GFR 62.58 08/24/2021 10:33 AM  ? GFR 56.68 (L) 06/30/2020 12:17 PM  ? MICROALBUR 5.7 01/06/2020 11:56 AM  ? MICROALBUR 4.2 (H) 01/12/2019 03:53 PM  ?  ?Last diabetic Eye exam:  ?Lab Results  ?Component Value Date/Time  ? HMDIABEYEEXA No Retinopathy 08/15/2020 12:00 AM  ?  ?Last diabetic Foot exam: No results Young for: HMDIABFOOTEX  ? ?Lab Results  ?Component Value Date  ? CHOL 123 06/16/2020  ? HDL 51 06/16/2020  ? LDWyndm[place]3 06/16/2020  ? TRIG 93 06/16/2020  ? CHOLHDL  2.4 06/16/2020  ? ? ? ?  Latest Ref Rng & Units 06/30/2020  ? 12:17 PM 02/05/2020  ?  8:16 AM 12/31/2019  ?  4:07 PM  ?Hepatic Function  ?Total Protein 6.0 - 8.3 g/dL 7.4   6.7   7.7    ?Albumin 3.5 - 5.2 g/dL 4.4    4.3    ?AST 0 - 37 U/L 12   12   32    ?ALT 0 - 53 U/L _0 ?Alk Phosphatase 39 - 117 U/L 51    51    ?Total Bilirubin 0.2 - 1.2 mg/dL 0.8   0.4   1.0    ? ? ?Lab Results  ?Component Value Date/Time  ? TSH 2.28 03/07/2021 01:47 PM  ? TSH 2.21 01/12/2019 03:53 PM  ? ? ? ?  Latest Ref Rng & Units 09/11/2021  ?   9:46 AM 06/07/2021  ?  9:14 AM 02/16/2021  ?  9:43 AM  ?CBC  ?WBC 4.0 - 10.5 K/uL 4.2   3.9   4.5    ?Hemoglobin 13.0 - 17.0 g/dL 14.7   14.2   12.7    ?Hematocrit 39.0 - 52.0 % 44.9   43.7   40.3    ?Platelets 150 - 400 K/uL 145   165.0   136    ? ? ?Lab Results  ?Component Value Date/Time  ? VD25OH 31 09/06/2008 08:59 PM  ? ? ?Clinical ASCVD: Yes  ?The ASCVD Risk score (Arnett DK, et al., 2019) failed to calculate for the following reasons: ?  The 2019 ASCVD risk score is only valid for ages 53 to 60   ? ? ?  09/25/2021  ? 11:36 AM 09/13/2021  ?  8:21 AM 09/15/2020  ?  4:25 PM  ?Depression screen PHQ 2/9  ?Decreased Interest 0 0 0  ?Down, Depressed, Hopeless 0 0 0  ?PHQ - 2 Score 0 0 0  ?  ?CHA2DS2-VASc Score = 8  ?The patient's score is based upon: ?CHF History: 1 ?HTN History: 1 ?Diabetes History: 1 ?Stroke History: 2 ?Vascular Disease History: 1 ?Age Score: 2 ?Gender Score: 0 ? ?   ? ? ?Social History  ? ?Tobacco Use  ?Smoking Status Former  ? Packs/day: 1.50  ? Years: 60.00  ? Pack years: 90.00  ? Types: Cigarettes  ? Quit date: 07/25/2008  ? Years since quitting: 13.1  ?Smokeless Tobacco Never  ? ?BP Readings from Last 3 Encounters:  ?09/13/21 124/80  ?09/11/21 (!) 169/61  ?08/24/21 132/84  ? ?Pulse Readings from Last 3 Encounters:  ?09/13/21 62  ?09/11/21 62  ?08/24/21 64  ? ?Wt Readings from Last 3 Encounters:  ?09/25/21 177 lb (80.3 kg)  ?09/13/21 177 lb 12.8 oz (80.6 kg)  ?08/24/21 179 lb (81.2 kg)  ? ?BMI Readings from Last 3 Encounters:  ?09/25/21 26.91 kg/m?  ?09/13/21 27.03 kg/m?  ?08/24/21 27.22 kg/m?  ? ? ?Assessment/Interventions: Review of patient past medical history, allergies, medications, health status, including review of consultants reports, laboratory and other test data, was performed as part of comprehensive evaluation and provision of chronic care management services.  ? ?SDOH:  (Social Determinants of Health) assessments and interventions performed: Yes ? ?SDOH Screenings  ? ?Alcohol Screen:  Low Risk   ? Last Alcohol Screening Score (AUDIT): 1  ?Depression (PHQ2-9): Low Risk   ? PHQ-2 Score: 0  ?Financial Resource Strain: Low Risk   ? Difficulty of Paying  Living Expenses: Not hard at all  ?Food Insecurity: No Food Insecurity  ? Worried About Charity fundraiser in the Last Year: Never true  ? Ran Out of Food in the Last Year: Never true  ?Housing: Low Risk   ? Last Housing Risk Score: 0  ?Physical Activity: Sufficiently Active  ? Days of Exercise per Week: 5 days  ? Minutes of Exercise per Session: 30 min  ?Social Connections: Socially Integrated  ? Frequency of Communication with Friends and Family: More than three times a week  ? Frequency of Social Gatherings with Friends and Family: More than three times a week  ? Attends Religious Services: More than 4 times per year  ? Active Member of Clubs or Organizations: Yes  ? Attends Archivist Meetings: More than 4 times per year  ? Marital Status: Married  ?Stress: No Stress Concern Present  ? Feeling of Stress : Not at all  ?Tobacco Use: Medium Risk  ? Smoking Tobacco Use: Former  ? Smokeless Tobacco Use: Never  ? Passive Exposure: Not on file  ?Transportation Needs: No Transportation Needs  ? Lack of Transportation (Medical): No  ? Lack of Transportation (Non-Medical): No  ? ? ?CCM Care Plan ? ?Allergies  ?Allergen Reactions  ? Lipitor [Atorvastatin] Other (See Comments)  ?  Muscle pain  ? Other Rash and Other (See Comments)  ?  Detergent- Rashes on the skin  ? ? ?Medications Reviewed Today   ? ? Reviewed by Criselda Peaches, LPN (Licensed Practical Nurse) on 09/25/21 at 1129  Med List Status: <None>  ? ?Medication Order Taking? Sig Documenting Provider Last Dose Status Informant  ?acetaminophen (TYLENOL) 500 MG tablet [id number] No Take 500 mg by mouth every 6 (six) hours as needed for mild pain or headache. [provider] Taking Active Self  ?         ?Med Note Wilmon Pali, MELISSA R   Thu Apr 28, 2020  4:40 PM)    ?albuterol (VENTOLIN  HFA) 108 (90 Base) MCG/ACT inhaler [id number] No Inhale 2 puffs into the lungs every 6 (six) hours as needed for wheezing or shortness of breath. Biagio Borg, MD Taking Active   ?carvedilol (COREG) 3.1

## 2021-09-28 NOTE — Patient Instructions (Signed)
Visit Information ? ?Following are the goals we discussed today:  ? ?Manage My Medicine  ? ?Timeframe:  Long-Range Goal ?Priority:  Medium ?Start Date:      10/03/20                       ?Expected End Date: 10/04/22                     ? ?Follow Up Date 03/2022 ?  ?- call for medicine refill 2 or 3 days before it runs out ?- call if I am sick and can't take my medicine ?- keep a list of all the medicines I take; vitamins and herbals too ?- use a pillbox to sort medicine  ?  ?Why is this important?   ?These steps will help you keep on track with your medicines. ? ?Plan: Telephone follow up appointment with care management team member scheduled for:  6 months ?The patient has been provided with contact information for the care management team and has been advised to call with any health related questions or concerns.  ? ?Tomasa Blase, PharmD ?Clinical Pharmacist, [place]  ? ?Please call the care guide team at [phone] if you need to cancel or reschedule your appointment.  ? ?Patient verbalizes understanding of instructions and care plan provided today and agrees to view in [place]. Active MyChart status confirmed with patient.   ? ?

## 2021-09-29 ENCOUNTER — Other Ambulatory Visit (HOSPITAL_COMMUNITY): Payer: Self-pay

## 2021-09-29 MED ORDER — CARVEDILOL 3.125 MG PO TABS: 3.1250 mg | ORAL_TABLET | Freq: Two times a day (BID) | ORAL | 0 refills | Status: DC

## 2021-10-02 ENCOUNTER — Ambulatory Visit (HOSPITAL_COMMUNITY)
Admission: RE | Admit: 2021-10-02 | Discharge: 2021-10-02 | Disposition: A | Discharge status: Home / Self Care | Payer: HMO | Source: Ambulatory Visit | Attending: Cardiovascular Disease | Admitting: Cardiovascular Disease

## 2021-10-02 DIAGNOSIS — I6523 Occlusion and stenosis of bilateral carotid arteries: Secondary | ICD-10-CM | POA: Diagnosis not present

## 2021-10-09 ENCOUNTER — Other Ambulatory Visit: Payer: Self-pay | Admitting: Podiatry

## 2021-10-10 ENCOUNTER — Telehealth: Payer: Self-pay

## 2021-10-10 ENCOUNTER — Other Ambulatory Visit: Payer: Self-pay | Admitting: Internal Medicine

## 2021-10-10 DIAGNOSIS — M170 Bilateral primary osteoarthritis of knee: Secondary | ICD-10-CM

## 2021-10-10 NOTE — Telephone Encounter (Signed)
I advised the pt to take Tylenol for pain per Dr. Ronnald Ramp

## 2021-10-10 NOTE — Telephone Encounter (Signed)
Pt calling to see what he can take for pain in Lt shoulder and arm? ? ?Please advise  ?

## 2021-10-16 ENCOUNTER — Encounter: Payer: Self-pay | Admitting: Cardiovascular Disease

## 2021-10-16 DIAGNOSIS — L820 Inflamed seborrheic keratosis: Secondary | ICD-10-CM | POA: Diagnosis not present

## 2021-10-16 DIAGNOSIS — Z08 Encounter for follow-up examination after completed treatment for malignant neoplasm: Secondary | ICD-10-CM | POA: Diagnosis not present

## 2021-10-16 DIAGNOSIS — Z85828 Personal history of other malignant neoplasm of skin: Secondary | ICD-10-CM | POA: Diagnosis not present

## 2021-10-17 MED ORDER — APIXABAN 5 MG PO TABS: 5.0000 mg | ORAL_TABLET | Freq: Two times a day (BID) | ORAL | 2 refills | Status: DC

## 2021-10-17 MED ORDER — SACUBITRIL-VALSARTAN 97-103 MG PO TABS: 1.0000 | ORAL_TABLET | Freq: Two times a day (BID) | ORAL | 2 refills | Status: DC

## 2021-10-18 ENCOUNTER — Ambulatory Visit (HOSPITAL_COMMUNITY)
Admission: RE | Admit: 2021-10-18 | Discharge: 2021-10-18 | Disposition: A | Discharge status: Home / Self Care | Payer: HMO | Source: Ambulatory Visit | Attending: Cardiology | Admitting: Cardiology

## 2021-10-18 VITALS — BP 125/72 | HR 52

## 2021-10-18 DIAGNOSIS — I472 Ventricular tachycardia, unspecified: Secondary | ICD-10-CM | POA: Diagnosis not present

## 2021-10-18 DIAGNOSIS — I110 Hypertensive heart disease with heart failure: Secondary | ICD-10-CM | POA: Insufficient documentation

## 2021-10-18 DIAGNOSIS — Z955 Presence of coronary angioplasty implant and graft: Secondary | ICD-10-CM | POA: Diagnosis not present

## 2021-10-18 DIAGNOSIS — R310 Gross hematuria: Secondary | ICD-10-CM | POA: Diagnosis not present

## 2021-10-18 DIAGNOSIS — Z79899 Other long term (current) drug therapy: Secondary | ICD-10-CM | POA: Diagnosis not present

## 2021-10-18 DIAGNOSIS — I493 Ventricular premature depolarization: Secondary | ICD-10-CM | POA: Insufficient documentation

## 2021-10-18 DIAGNOSIS — I25119 Atherosclerotic heart disease of native coronary artery with unspecified angina pectoris: Secondary | ICD-10-CM | POA: Diagnosis not present

## 2021-10-18 DIAGNOSIS — Z7901 Long term (current) use of anticoagulants: Secondary | ICD-10-CM | POA: Diagnosis not present

## 2021-10-18 DIAGNOSIS — I5022 Chronic systolic (congestive) heart failure: Secondary | ICD-10-CM | POA: Diagnosis not present

## 2021-10-18 DIAGNOSIS — I4819 Other persistent atrial fibrillation: Secondary | ICD-10-CM | POA: Diagnosis not present

## 2021-10-18 DIAGNOSIS — Z7902 Long term (current) use of antithrombotics/antiplatelets: Secondary | ICD-10-CM | POA: Insufficient documentation

## 2021-10-18 DIAGNOSIS — E785 Hyperlipidemia, unspecified: Secondary | ICD-10-CM

## 2021-10-18 LAB — BASIC METABOLIC PANEL
Anion gap: 6 (ref 5–15)
BUN: 17 mg/dL (ref 8–23)
CO2: 28 mmol/L (ref 22–32)
Calcium: 9.7 mg/dL (ref 8.9–10.3)
Chloride: 107 mmol/L (ref 98–111)
Creatinine, Ser: 1.07 mg/dL (ref 0.61–1.24)
GFR, Estimated: >60 mL/min (ref >60)
Glucose, Bld: 115 mg/dL — ABNORMAL HIGH (ref 70–99)
Potassium: 4.5 mmol/L (ref 3.5–5.1)
Sodium: 141 mmol/L (ref 135–145)

## 2021-10-18 LAB — MAGNESIUM: Magnesium: 2.4 mg/dL (ref 1.7–2.4)

## 2021-10-18 LAB — LIPID PANEL
Cholesterol: 129 mg/dL (ref 0–200)
HDL: 45 mg/dL (ref >40)
LDL Cholesterol: 60 mg/dL (ref 0–99)
Total CHOL/HDL Ratio: 2.9 RATIO
Triglycerides: 118 mg/dL (ref <150)
VLDL: 24 mg/dL (ref 0–40)

## 2021-10-18 LAB — CBC
HCT: 46.3 % (ref 39.0–52.0)
Hemoglobin: 15.2 g/dL (ref 13.0–17.0)
MCH: 29.5 pg (ref 26.0–34.0)
MCHC: 32.8 g/dL (ref 30.0–36.0)
MCV: 89.7 fL (ref 80.0–100.0)
Platelets: 148 10*3/uL — ABNORMAL LOW (ref 150–400)
RBC: 5.16 MIL/uL (ref 4.22–5.81)
RDW: 13.2 % (ref 11.5–15.5)
WBC: 4.7 10*3/uL (ref 4.0–10.5)
nRBC: 0 % (ref 0.0–0.2)

## 2021-10-18 MED ORDER — NITROGLYCERIN 0.4 MG SL SUBL: 0.4000 mg | SUBLINGUAL_TABLET | SUBLINGUAL | 3 refills | Status: DC | PRN

## 2021-10-18 NOTE — Progress Notes (Signed)
Date:  10/18/2021   ID:  Dillon Young, DOB [birth year], MRN [id number]   Provider location: [facility] Type of Visit: Established patient   PCP:  [name], MD  Cardiologist:  [name], MD HF Cardiology: Dr. [name]   History of Present Illness: Dillon Young is a 88 y.o. male who has a h/o coronary artery disease, chronic systolic CHF, and persistent atrial fibrillation, he was initially referred by Dr. [name] for evaluation of CHF.  He is status post angioplasty in 1994 and 1995 of his circumflex coronary artery. He had right carotid endarterectomy performed by Dr. Curt Jews June 2013.  Given angina, he had cath in 8/17.  This showed severe RCA stenosis treated with DES. He also had a very small ramus branch that was 90% stenosed and not suitable for intervention as well as a 70% mid LAD lesion although he had no evidence of anterior ischemia on his stress test.   He was diagnosed in 5/19 with new onset atrial fibrillation. Echo was done in 5/19 and showed EF decreased to 30-35%, normal EF by echo in August 2017. He had a DCCV in 5/19, unfortunately, he only held NSR for a few days and was been back in atrial fibrillation soon after that.  He had recurrent chest pain and was again cathed in 01/04/18, this time showing 80% proximal/mid LAD that was treated with DES.  In 10/19, he was admitted for Tikosyn load and was cardioverted back to NSR.    Echo in 1/20 showed  EF up to 55-60% with mild LVH.    11/21 Zio patch showed 22% PVCs with short NSVT runs.  Echo was done in 12/21, showing EF 50-55%, mild LVH, mild MR, normal RV.  Given frequent PVCs, patient was sent to see EP (Dr Rayann Heman).  He did not think that the patient was an ablation candidate.   He has been diagnosed with bladder cancer and has had occasional hematuria.    He presents for followup of CHF, PVCs, and atrial fibrillation. He is exercising 4 times/week, walking 30 minutes at a time.   No significant exertional dyspnea.  No chest pain.  No BRBPR/melena.  No lightheadedness.   ECG (personally reviewed): NSR with PVCs, 1st degree AVB, QTc 422 msec   Labs (5/19): LDL 50 Labs (8/19): K 4.1, creatinine 1.14 Labs (10/19): K 4.6, creatinine 1.17 Labs (11/19): K 4.5, creatinine 1.08 Labs (1/20): LDL 48 Labs (2/20): K 4.4, creatinine 1.18 Labs (2/21): K 4.2, creatinine 1.14, LDL 35 Labs (1/22): K 4, creatinine 1.09, hgb 13.5, LDL 53 Labs (2/22): K 4.5, creatinine 1.17 Labs (4/23): K 4.4, creatinine 1.05   PMH: 1. HTN 2. Hyperlipidemia 3. Carotid artery disease: Right CEA in 6/13.  - Carotid dopplers (5/20): 1-39% BICA stenosis, left subclavian stenosis.  - Carotid dopplers (5/21): s/p right CEA, 36-14% LICA stenosis.  - Carotid dopplers (5/23): s/p right CEA, mild disease on left 4. CAD: - PTCA to LCx in 1994, 1995.  - Angina 8/17 => DES to RCA, also noted to have 70% mLAD, 90% stenosis small ramus.  - LHC 12/26/17 with 80% ostial-proximal LAD, EF < 25% on LV-gram.  DES to LAD.  5. Chronic systolic CHF: Ischemic cardiomyopathy.   - Echo (8/17) with normal EF.  - Echo (5/19) with EF 30-35%, severe LV dilation, regional wall motion abnormalities.  - Echo (1/20): EF 55-60%, mild LVH, normal RV size and systolic function.  -  Echo (12/21): EF 50-55%, mild LVH, normal RV size and systolic function, moderate LAE, mild MR, IVC normal.  6. Atrial fibrillation: Diagnosed 5/19.   - DCCV 5/19 only held for about 2 days.  - Tikosyn loaded in 10/19 and DCCV to NSR.  7. Bladder cancer 8. PVCs: Zio patch (11/21) with 22% PVCs, short NSVT runs.   Current Outpatient Medications  Medication Sig Dispense Refill   acetaminophen (TYLENOL) 500 MG tablet Take 500 mg by mouth every 6 (six) hours as needed for mild pain or headache.     apixaban (ELIQUIS) 5 MG TABS tablet Take 1 tablet (5 mg total) by mouth 2 (two) times daily. 180 tablet 2   carvedilol (COREG) 3.125 MG tablet Take 1 tablet  (3.125 mg total) by mouth 2 (two) times daily with a meal. Last refill without office visit please call [phone] to schedule 60 tablet 0   carvedilol (COREG) 6.25 MG tablet Take 1.5 tablets (9.375 mg total) by mouth in the morning and at bedtime. NEEDS FOLLOW UP APPOINTMENT FOR MORE REFILLS (Patient taking differently: Take 6.25 mg by mouth in the morning and at bedtime. NEEDS FOLLOW UP APPOINTMENT FOR MORE REFILLS) 60 tablet 0   Cholecalciferol (VITAMIN D-3) 25 MCG (1000 UT) CAPS Take 1,000 Units by mouth daily.     dofetilide (TIKOSYN) 125 MCG capsule TAKE 1 CAPSULE BY MOUTH TWICE DAILY 180 capsule 3   Ferric Maltol (ACCRUFER) 30 MG CAPS Take 1 capsule by mouth in the morning and at bedtime. 180 capsule 0   fexofenadine (ALLEGRA) 180 MG tablet Take 180 mg by mouth daily as needed for allergies or rhinitis.      fluticasone (FLONASE) 50 MCG/ACT nasal spray Place 1 spray into both nostrils daily.     gabapentin (NEURONTIN) 100 MG capsule Take 100 mg by mouth 3 (three) times daily.     hydrocortisone (ANUSOL-HC) 2.5 % rectal cream Place 1 application rectally 3 (three) times daily as needed for hemorrhoids. 30 g 1   Hypromellose (ARTIFICIAL TEARS OP) Place 1 drop into both eyes 4 (four) times daily as needed (dry eyes).     ipratropium (ATROVENT) 0.06 % nasal spray Place 2 sprays into both nostrils 4 (four) times daily as needed for rhinitis. 90 mL 1   levocetirizine (XYZAL) 5 MG tablet Take 1 tablet (5 mg total) by mouth every evening. 90 tablet 1   linaclotide (LINZESS) 145 MCG CAPS capsule Take 1 capsule (145 mcg total) by mouth daily before breakfast. (Patient taking differently: Take 145 mcg by mouth as needed.) 90 capsule 1   rosuvastatin (CRESTOR) 10 MG tablet TAKE 1 TABLET BY MOUTH EVERY DAY 90 tablet 1   sacubitril-valsartan (ENTRESTO) 97-103 MG Take 1 tablet by mouth 2 (two) times daily. 180 tablet 2   spironolactone (ALDACTONE) 25 MG tablet Take 2 tablets (50 mg total) by mouth daily.  180 tablet 3   Tiotropium Bromide Monohydrate (SPIRIVA RESPIMAT) 2.5 MCG/ACT AERS Inhale 2 puffs into the lungs daily. 4 g 11   triamcinolone cream (KENALOG) 0.1 % Apply 1 application topically daily as needed (to affected areas, for itching).      Wheat Dextrin (BENEFIBER DRINK MIX PO) Take 2 Scoops by mouth daily in the afternoon. Take 2 tablespoons daily     nitroGLYCERIN (NITROSTAT) 0.4 MG SL tablet Place 1 tablet (0.4 mg total) under the tongue every 5 (five) minutes as needed for chest pain. 25 tablet 3   No current facility-administered medications for this encounter.  Allergies:   Lipitor [atorvastatin] and Other   Social History:  The patient  reports that he quit smoking about 13 years ago. His smoking use included cigarettes. He has a 90.00 pack-year smoking history. He has never used smokeless tobacco. He reports that he does not currently use alcohol. He reports that he does not use drugs.   Family History:  The patient's family history includes Asthma in his paternal grandmother; Heart disease in his father; Hypertension in his mother.   ROS:  Please see the history of present illness.   All other systems are personally reviewed and negative.   Exam:   BP 125/72   Pulse (!) 52   SpO2 98%  General: NAD Neck: No JVD, no thyromegaly or thyroid nodule.  Lungs: Clear to auscultation bilaterally with normal respiratory effort. CV: Nondisplaced PMI.  Heart regular S1/S2, no S3/S4, no murmur.  No peripheral edema.  No carotid bruit.  Normal pedal pulses.  Abdomen: Soft, nontender, no hepatosplenomegaly, no distention.  Skin: Intact without lesions or rashes.  Neurologic: Alert and oriented x 3.  Psych: Normal affect. Extremities: No clubbing or cyanosis.  HEENT: Normal.   Recent Labs: 03/07/2021: TSH 2.28 10/18/2021: BUN 17; Creatinine, Ser 1.07; Hemoglobin 15.2; Magnesium 2.4; Platelets 148; Potassium 4.5; Sodium 141  Personally reviewed   Wt Readings from Last 3  Encounters:  09/25/21 80.3 kg (177 lb)  09/13/21 80.6 kg (177 lb 12.8 oz)  08/24/21 81.2 kg (179 lb)      ASSESSMENT AND PLAN:  1. Chronic systolic CHF: Suspect mixed ischemic/nonischemic cardiomyopathy.  Echo in 5/19 with EF 30-35% and severe LV dilation.  CAD likely plays role, but fall in EF was noted around the time he went into atrial fibrillation.  No RVR, but can see fall in EF with afib even in the absence of RVR. He has been in NSR on Tikosyn, and echo from 1/20 showed EF up to 55-60%.  I was concerned that there would be a fall in his EF due to frequent PVCs, but echo in 12/21 showed EF 50-55%. On exam, he is euvolemic.  NYHA class II symptoms. - Continue Coreg 9.375 mg bid with frequent PVCs, HR in 50s so will not increase.  - Continue Entresto 97/103 bid.    - Continue spironolactone 50 mg daily. BMET today.  - I will arrange for repeat echo, has been a couple of years.  2. CAD: Most recently, DES to proximal/mid LAD on 12/26/17.  He was on Plavix for > 6 months then stopped it with gross hematuria.  He has had frequent PVCs, but LV function remains preserved and he has had no chest pain.  - Continue apixaban 5 mg bid.  CBC today.  - No ASA with apixaban use.  - Continue Crestor, check lipids.  3. Atrial fibrillation: Paroxysmal.  DCCV in 5/19 only held a few days.  Tikosyn loaded in 10/19 with DCCV, now in NSR.   QTc ok on ECG today.  - Continue Eliquis 5 mg bid.  - Continue Tikosyn, BMET and Mg today.  4. HTN: BP controlled on current regimen. 5. Carotid stenosis: Stable carotid dopplers in 5/23.  6. PVCs: 22% PVCs with NSVT runs on Zio patch in 11/21.  This is despite being on Tikosyn and Coreg.  He has not had chest pain and EF remains preserved.  He saw Dr. Rayann Heman who recommended medical management rather than attempting ablation.  - Continue Coreg 9.375 bid, will not increase with bradycardia.  -  Continue Tikosyn.   Followup in 6 months with APP as long as echo is stable,  needs BMET in 3 mos.   Signed, [name], MD  10/18/2021  [facility] [address] [facility] [place] [address] [phone] (office) [phone] (fax)

## 2021-10-18 NOTE — Patient Instructions (Signed)
There has been no changes to your medications.    Labs done today, your results will be available in MyChart, we will contact you for abnormal readings.  Your physician has requested that you have an echocardiogram. Echocardiography is a painless test that uses sound waves to create images of your heart. It provides your doctor with information about the size and shape of your heart and how well your heart's chambers and valves are working. This procedure takes approximately one hour. There are no restrictions for this procedure.  Your physician recommends that you schedule a follow-up appointment in: 6 months.  If you have any questions or concerns before your next appointment please send [place] a message through [place] or call our office at [phone].    TO LEAVE A MESSAGE FOR THE NURSE SELECT OPTION 2, PLEASE LEAVE A MESSAGE INCLUDING: YOUR NAME DATE OF BIRTH CALL BACK NUMBER REASON FOR CALL**this is important as we prioritize the call backs  YOU WILL RECEIVE A CALL BACK THE SAME DAY AS LONG AS YOU CALL BEFORE 4:00 PM  At the [facility], you and your health needs are our priority. As part of our continuing mission to provide you with exceptional heart care, we have created designated Provider Care Teams. These Care Teams include your primary Cardiologist (physician) and Advanced Practice Providers (APPs- Physician Assistants and Nurse Practitioners) who all work together to provide you with the care you need, when you need it.   You may see any of the following providers on your designated Care Team at your next follow up: Dr Glori Bickers Dr Haynes Kerns, NP Lyda Jester, [place] [facility] [place], [place] Audry Riles, PharmD   Please be sure to bring in all your medications bottles to every appointment.

## 2021-10-22 ENCOUNTER — Other Ambulatory Visit (HOSPITAL_COMMUNITY): Payer: Self-pay | Admitting: Cardiology

## 2021-10-25 DIAGNOSIS — I110 Hypertensive heart disease with heart failure: Secondary | ICD-10-CM | POA: Diagnosis not present

## 2021-10-25 DIAGNOSIS — D649 Anemia, unspecified: Secondary | ICD-10-CM | POA: Diagnosis not present

## 2021-10-25 DIAGNOSIS — I5020 Unspecified systolic (congestive) heart failure: Secondary | ICD-10-CM

## 2021-10-25 DIAGNOSIS — I2510 Atherosclerotic heart disease of native coronary artery without angina pectoris: Secondary | ICD-10-CM | POA: Diagnosis not present

## 2021-10-25 DIAGNOSIS — E1159 Type 2 diabetes mellitus with other circulatory complications: Secondary | ICD-10-CM | POA: Diagnosis not present

## 2021-10-25 DIAGNOSIS — Z87891 Personal history of nicotine dependence: Secondary | ICD-10-CM | POA: Diagnosis not present

## 2021-10-25 DIAGNOSIS — I4891 Unspecified atrial fibrillation: Secondary | ICD-10-CM

## 2021-10-25 DIAGNOSIS — E785 Hyperlipidemia, unspecified: Secondary | ICD-10-CM

## 2021-11-03 ENCOUNTER — Ambulatory Visit (HOSPITAL_COMMUNITY)
Admission: RE | Admit: 2021-11-03 | Discharge: 2021-11-03 | Disposition: A | Discharge status: Home / Self Care | Payer: HMO | Source: Ambulatory Visit | Attending: Internal Medicine | Admitting: Internal Medicine

## 2021-11-03 DIAGNOSIS — I5022 Chronic systolic (congestive) heart failure: Secondary | ICD-10-CM | POA: Insufficient documentation

## 2021-11-03 DIAGNOSIS — I2510 Atherosclerotic heart disease of native coronary artery without angina pectoris: Secondary | ICD-10-CM | POA: Diagnosis not present

## 2021-11-03 DIAGNOSIS — I4891 Unspecified atrial fibrillation: Secondary | ICD-10-CM | POA: Insufficient documentation

## 2021-11-03 DIAGNOSIS — I358 Other nonrheumatic aortic valve disorders: Secondary | ICD-10-CM | POA: Insufficient documentation

## 2021-11-03 DIAGNOSIS — E119 Type 2 diabetes mellitus without complications: Secondary | ICD-10-CM | POA: Diagnosis not present

## 2021-11-03 DIAGNOSIS — I110 Hypertensive heart disease with heart failure: Secondary | ICD-10-CM | POA: Insufficient documentation

## 2021-11-03 LAB — ECHOCARDIOGRAM COMPLETE
AR max vel: 1.58 cm2
AV Peak grad: 7.5 mmHg
Ao pk vel: 1.37 m/s
Area-P 1/2: 3.6 cm2
Calc EF: 56.4 %
MV M vel: 5.2 m/s
MV Peak grad: 108.2 mmHg
S' Lateral: 3.3 cm
Single Plane A2C EF: 57.2 %
Single Plane A4C EF: 57 %

## 2021-11-10 ENCOUNTER — Encounter (HOSPITAL_COMMUNITY): Payer: Self-pay | Admitting: *Deleted

## 2021-11-13 ENCOUNTER — Other Ambulatory Visit (HOSPITAL_COMMUNITY): Payer: Self-pay | Admitting: Cardiovascular Disease

## 2021-11-13 DIAGNOSIS — I6523 Occlusion and stenosis of bilateral carotid arteries: Secondary | ICD-10-CM

## 2021-11-15 LAB — HM DIABETES EYE EXAM

## 2021-11-20 ENCOUNTER — Encounter: Payer: Self-pay | Admitting: Internal Medicine

## 2021-11-20 ENCOUNTER — Ambulatory Visit: Payer: HMO | Admitting: Podiatry

## 2021-11-20 ENCOUNTER — Other Ambulatory Visit: Payer: Self-pay | Admitting: Internal Medicine

## 2021-11-20 DIAGNOSIS — E1140 Type 2 diabetes mellitus with diabetic neuropathy, unspecified: Secondary | ICD-10-CM

## 2021-11-20 DIAGNOSIS — E0843 Diabetes mellitus due to underlying condition with diabetic autonomic (poly)neuropathy: Secondary | ICD-10-CM

## 2021-11-20 MED ORDER — GABAPENTIN 100 MG PO CAPS: 100.0000 mg | ORAL_CAPSULE | Freq: Three times a day (TID) | ORAL | 1 refills | Status: DC

## 2021-11-21 DIAGNOSIS — H02055 Trichiasis without entropian left lower eyelid: Secondary | ICD-10-CM | POA: Diagnosis not present

## 2021-12-05 ENCOUNTER — Encounter: Payer: Self-pay | Admitting: Internal Medicine

## 2021-12-06 DIAGNOSIS — Z8551 Personal history of malignant neoplasm of bladder: Secondary | ICD-10-CM | POA: Diagnosis not present

## 2021-12-06 DIAGNOSIS — R310 Gross hematuria: Secondary | ICD-10-CM | POA: Diagnosis not present

## 2021-12-12 ENCOUNTER — Other Ambulatory Visit (HOSPITAL_COMMUNITY): Payer: Self-pay | Admitting: Cardiology

## 2021-12-14 ENCOUNTER — Other Ambulatory Visit (HOSPITAL_COMMUNITY): Payer: Self-pay | Admitting: Cardiology

## 2021-12-15 DIAGNOSIS — C678 Malignant neoplasm of overlapping sites of bladder: Secondary | ICD-10-CM | POA: Diagnosis not present

## 2021-12-15 DIAGNOSIS — N281 Cyst of kidney, acquired: Secondary | ICD-10-CM | POA: Diagnosis not present

## 2021-12-15 DIAGNOSIS — N200 Calculus of kidney: Secondary | ICD-10-CM | POA: Diagnosis not present

## 2021-12-19 DIAGNOSIS — Z5111 Encounter for antineoplastic chemotherapy: Secondary | ICD-10-CM | POA: Diagnosis not present

## 2021-12-19 DIAGNOSIS — C678 Malignant neoplasm of overlapping sites of bladder: Secondary | ICD-10-CM | POA: Diagnosis not present

## 2021-12-25 ENCOUNTER — Encounter: Payer: Self-pay | Admitting: Internal Medicine

## 2021-12-25 ENCOUNTER — Ambulatory Visit (INDEPENDENT_AMBULATORY_CARE_PROVIDER_SITE_OTHER): Payer: PPO | Admitting: Internal Medicine

## 2021-12-25 VITALS — BP 140/82 | HR 62 | Temp 98.0°F | Ht 68.0 in | Wt 175.0 lb

## 2021-12-25 DIAGNOSIS — D510 Vitamin B12 deficiency anemia due to intrinsic factor deficiency: Secondary | ICD-10-CM | POA: Diagnosis not present

## 2021-12-25 DIAGNOSIS — D508 Other iron deficiency anemias: Secondary | ICD-10-CM

## 2021-12-25 DIAGNOSIS — E119 Type 2 diabetes mellitus without complications: Secondary | ICD-10-CM

## 2021-12-25 MED ORDER — CYANOCOBALAMIN 1000 MCG/ML IJ SOLN
1000.0000 ug | Freq: Once | INTRAMUSCULAR | Status: AC
  Administered 2021-12-25: 1000 ug via INTRAMUSCULAR

## 2021-12-25 NOTE — Patient Instructions (Signed)

## 2021-12-25 NOTE — Progress Notes (Unsigned)
Subjective:  Patient ID: Dillon Young, male    DOB: 22-Nov-1932  Age: 88 y.o. MRN: [id number]  CC: Anemia and Diabetes   HPI DEMITRIUS CRASS presents for f/up -  He feels well.  Offers no complaints.  Outpatient Medications Prior to Visit  Medication Sig Dispense Refill   acetaminophen (TYLENOL) 500 MG tablet Take 500 mg by mouth every 6 (six) hours as needed for mild pain or headache.     apixaban (ELIQUIS) 5 MG TABS tablet Take 1 tablet (5 mg total) by mouth 2 (two) times daily. 180 tablet 2   carvedilol (COREG) 3.125 MG tablet TAKE 1 TABLET BY MOUTH TWICE A DAY WITH A MEAL 60 tablet 11   carvedilol (COREG) 6.25 MG tablet Take 1.5 tablets (9.375 mg total) by mouth 2 (two) times daily with a meal. 200 tablet 3   Cholecalciferol (VITAMIN D-3) 25 MCG (1000 UT) CAPS Take 1,000 Units by mouth daily.     dofetilide (TIKOSYN) 125 MCG capsule TAKE 1 CAPSULE BY MOUTH TWICE DAILY 180 capsule 3   Ferric Maltol (ACCRUFER) 30 MG CAPS Take 1 capsule by mouth in the morning and at bedtime. 180 capsule 0   fexofenadine (ALLEGRA) 180 MG tablet Take 180 mg by mouth daily as needed for allergies or rhinitis.      fluticasone (FLONASE) 50 MCG/ACT nasal spray Place 1 spray into both nostrils daily.     gabapentin (NEURONTIN) 100 MG capsule Take 1 capsule (100 mg total) by mouth 3 (three) times daily. 270 capsule 1   hydrocortisone (ANUSOL-HC) 2.5 % rectal cream Place 1 application rectally 3 (three) times daily as needed for hemorrhoids. 30 g 1   Hypromellose (ARTIFICIAL TEARS OP) Place 1 drop into both eyes 4 (four) times daily as needed (dry eyes).     ipratropium (ATROVENT) 0.06 % nasal spray Place 2 sprays into both nostrils 4 (four) times daily as needed for rhinitis. 90 mL 1   levocetirizine (XYZAL) 5 MG tablet Take 1 tablet (5 mg total) by mouth every evening. 90 tablet 1   linaclotide (LINZESS) 145 MCG CAPS capsule Take 1 capsule (145 mcg total) by mouth daily before breakfast. (Patient taking  differently: Take 145 mcg by mouth as needed.) 90 capsule 1   nitroGLYCERIN (NITROSTAT) 0.4 MG SL tablet Place 1 tablet (0.4 mg total) under the tongue every 5 (five) minutes as needed for chest pain. 25 tablet 3   rosuvastatin (CRESTOR) 10 MG tablet TAKE 1 TABLET BY MOUTH EVERY DAY 90 tablet 1   sacubitril-valsartan (ENTRESTO) 97-103 MG Take 1 tablet by mouth 2 (two) times daily. 180 tablet 2   spironolactone (ALDACTONE) 25 MG tablet TAKE 2 TABLETS BY MOUTH EVERY DAY 180 tablet 3   Tiotropium Bromide Monohydrate (SPIRIVA RESPIMAT) 2.5 MCG/ACT AERS Inhale 2 puffs into the lungs daily. 4 g 11   triamcinolone cream (KENALOG) 0.1 % Apply 1 application topically daily as needed (to affected areas, for itching).      Wheat Dextrin (BENEFIBER DRINK MIX PO) Take 2 Scoops by mouth daily in the afternoon. Take 2 tablespoons daily     No facility-administered medications prior to visit.    ROS Review of Systems  Constitutional:  Negative for diaphoresis and fatigue.  HENT: Negative.    Eyes: Negative.   Respiratory:  Negative for cough, chest tightness, shortness of breath and wheezing.   Cardiovascular:  Negative for chest pain, palpitations and leg swelling.  Gastrointestinal:  Negative for abdominal pain, constipation,  diarrhea, nausea and vomiting.  Endocrine: Negative.   Genitourinary: Negative.  Negative for difficulty urinating.  Musculoskeletal: Negative.  Negative for arthralgias and back pain.  Skin: Negative.   Neurological: Negative.  Negative for dizziness, weakness and light-headedness.  Hematological:  Negative for adenopathy. Does not bruise/bleed easily.    Objective:  BP 140/82 (BP Location: Left Arm, Patient Position: Sitting, Cuff Size: Large)   Pulse 62   Temp 98 F (36.7 C) (Oral)   Ht '5\' 8"'$  (1.727 m)   Wt 175 lb (79.4 kg)   SpO2 98%   BMI 26.61 kg/m   BP Readings from Last 3 Encounters:  12/25/21 140/82  10/18/21 125/72  09/13/21 124/80    Wt Readings from  Last 3 Encounters:  12/25/21 175 lb (79.4 kg)  09/25/21 177 lb (80.3 kg)  09/13/21 177 lb 12.8 oz (80.6 kg)    Physical Exam Vitals reviewed.  HENT:     Nose: Nose normal.     Mouth/Throat:     Mouth: Mucous membranes are moist.  Eyes:     General: No scleral icterus.    Conjunctiva/sclera: Conjunctivae normal.  Cardiovascular:     Rate and Rhythm: Normal rate and regular rhythm.     Heart sounds: No murmur heard. Pulmonary:     Effort: Pulmonary effort is normal.     Breath sounds: No stridor. No wheezing, rhonchi or rales.  Abdominal:     General: Abdomen is flat.     Palpations: There is no mass.     Tenderness: There is no abdominal tenderness. There is no guarding.     Hernia: No hernia is present.  Musculoskeletal:        General: Normal range of motion.     Cervical back: Neck supple.     Right lower leg: No edema.     Left lower leg: No edema.  Lymphadenopathy:     Cervical: No cervical adenopathy.  Skin:    General: Skin is warm.  Neurological:     General: No focal deficit present.  Psychiatric:        Mood and Affect: Mood normal.        Behavior: Behavior normal.     Lab Results  Component Value Date   WBC 4.7 10/18/2021   HGB 15.2 10/18/2021   HCT 46.3 10/18/2021   PLT 148 (L) 10/18/2021   GLUCOSE 115 (H) 10/18/2021   CHOL 129 10/18/2021   TRIG 118 10/18/2021   HDL 45 10/18/2021   LDLCALC 60 10/18/2021   ALT 9 06/30/2020   AST 12 06/30/2020   NA 141 10/18/2021   K 4.5 10/18/2021   CL 107 10/18/2021   CREATININE 1.07 10/18/2021   BUN 17 10/18/2021   CO2 28 10/18/2021   TSH 2.28 03/07/2021   PSA 0.015 12/21/2020   INR 1.1 01/06/2021   HGBA1C 6.4 08/24/2021   MICROALBUR 5.7 01/06/2020    ECHOCARDIOGRAM COMPLETE  Result Date: 11/03/2021    ECHOCARDIOGRAM REPORT   Patient Name:   HARSHA YUSKO Date of Exam: 11/03/2021 Medical Rec #:  [id number]        Height:       68.0 in Accession #:    [id number]       Weight:       177.0 lb Date of  Birth:  1932-11-05        BSA:          1.940 m Patient Age:    36 years  BP:           125/72 mmHg Patient Gender: M                HR:           60 bpm. Exam Location:  Outpatient Procedure: 2D Echo, Cardiac Doppler and Color Doppler Indications:    CHF  History:        Patient has prior history of Echocardiogram examinations. CAD,                 Arrythmias:Atrial Fibrillation; Risk Factors:Hypertension and                 Diabetes.  Sonographer:    Jyl Heinz Referring Phys: [place]  1. Left ventricular ejection fraction, by estimation, is 55 to 60%. The left ventricle has normal function. The left ventricle has no regional wall motion abnormalities. There is mild left ventricular hypertrophy. Left ventricular diastolic parameters are consistent with Grade I diastolic dysfunction (impaired relaxation).  2. Right ventricular systolic function is normal. The right ventricular size is normal.  3. Left atrial size was mildly dilated.  4. The mitral valve is normal in structure. No evidence of mitral valve regurgitation. No evidence of mitral stenosis.  5. The aortic valve is calcified. There is mild calcification of the aortic valve. There is mild thickening of the aortic valve. Aortic valve regurgitation is not visualized. Aortic valve sclerosis is present, with no evidence of aortic valve stenosis.  6. The inferior vena cava is normal in size with greater than 50% respiratory variability, suggesting right atrial pressure of 3 mmHg. Comparison(s): Prior images reviewed side by side. FINDINGS  Left Ventricle: Left ventricular ejection fraction, by estimation, is 55 to 60%. The left ventricle has normal function. The left ventricle has no regional wall motion abnormalities. The left ventricular internal cavity size was normal in size. There is  mild left ventricular hypertrophy. Left ventricular diastolic parameters are consistent with Grade I diastolic dysfunction (impaired  relaxation). Right Ventricle: The right ventricular size is normal. No increase in right ventricular wall thickness. Right ventricular systolic function is normal. Left Atrium: Left atrial size was mildly dilated. Right Atrium: Right atrial size was normal in size. Pericardium: There is no evidence of pericardial effusion. Mitral Valve: The mitral valve is normal in structure. No evidence of mitral valve regurgitation. No evidence of mitral valve stenosis. Tricuspid Valve: The tricuspid valve is normal in structure. Tricuspid valve regurgitation is mild . No evidence of tricuspid stenosis. Aortic Valve: The aortic valve is calcified. There is mild calcification of the aortic valve. There is mild thickening of the aortic valve. Aortic valve regurgitation is not visualized. Aortic valve sclerosis is present, with no evidence of aortic valve stenosis. Aortic valve peak gradient measures 7.5 mmHg. Pulmonic Valve: The pulmonic valve was normal in structure. Pulmonic valve regurgitation is not visualized. No evidence of pulmonic stenosis. Aorta: The aortic root is normal in size and structure. Venous: The inferior vena cava is normal in size with greater than 50% respiratory variability, suggesting right atrial pressure of 3 mmHg. IAS/Shunts: No atrial level shunt detected by color flow Doppler.  LEFT VENTRICLE PLAX 2D LVIDd:         5.10 cm      Diastology LVIDs:         3.30 cm      LV e' medial:    4.46 cm/s LV PW:  1.20 cm      LV E/e' medial:  14.0 LV IVS:        1.10 cm      LV e' lateral:   5.33 cm/s LVOT diam:     2.00 cm      LV E/e' lateral: 11.7 LV SV:         55 LV SV Index:   28 LVOT Area:     3.14 cm  LV Volumes (MOD) LV vol d, MOD A2C: 101.0 ml LV vol d, MOD A4C: 98.1 ml LV vol s, MOD A2C: 43.2 ml LV vol s, MOD A4C: 42.2 ml LV SV MOD A2C:     57.8 ml LV SV MOD A4C:     98.1 ml LV SV MOD BP:      56.2 ml RIGHT VENTRICLE             IVC RV Basal diam:  3.30 cm     IVC diam: 1.20 cm RV Mid diam:     2.40 cm RV S prime:     12.40 cm/s TAPSE (M-mode): 2.9 cm LEFT ATRIUM             Index        RIGHT ATRIUM           Index LA diam:        4.10 cm 2.11 cm/m   RA Area:     16.90 cm LA Vol (A2C):   70.4 ml 36.28 ml/m  RA Volume:   40.60 ml  20.92 ml/m LA Vol (A4C):   64.1 ml 33.04 ml/m LA Biplane Vol: 68.2 ml 35.15 ml/m  AORTIC VALVE AV Area (Vmax): 1.58 cm AV Vmax:        137.00 cm/s AV Peak Grad:   7.5 mmHg LVOT Vmax:      69.00 cm/s LVOT Vmean:     47.600 cm/s LVOT VTI:       0.174 m  AORTA Ao Root diam: 3.20 cm Ao Asc diam:  2.80 cm MITRAL VALVE               TRICUSPID VALVE MV Area (PHT): 3.60 cm    TR Peak grad:   21.3 mmHg MV Decel Time: 211 msec    TR Vmax:        231.00 cm/s MR Peak grad: 108.2 mmHg MR Vmax:      520.00 cm/s  SHUNTS MV E velocity: 62.60 cm/s  Systemic VTI:  0.17 m MV A velocity: 79.50 cm/s  Systemic Diam: 2.00 cm MV E/A ratio:  0.79 Candee Furbish MD Electronically signed by Candee Furbish MD Signature Date/Time: 11/03/2021/10:35:36 AM    Final     Assessment & Plan:   Mehran was seen today for anemia and diabetes.  Diagnoses and all orders for this visit:  Vitamin B12 deficiency anemia due to intrinsic factor deficiency -     CBC with Differential/Platelet; Future -     cyanocobalamin (VITAMIN B12) injection 1,000 mcg  Diabetes mellitus without complication (HCC)-will monitor his A1c. -     Basic metabolic panel; Future -     Hemoglobin A1c; Future  Anemia, iron deficiency, inadequate dietary intake-I will monitor his H&H. -     IBC + Ferritin; Future   I am having Princess Bruins. Gellis "Bill" maintain his Hypromellose (ARTIFICIAL TEARS OP), triamcinolone cream, fexofenadine, acetaminophen, Vitamin D-3, Wheat Dextrin (BENEFIBER DRINK MIX PO), hydrocortisone, dofetilide, levocetirizine, Spiriva Respimat, ipratropium, fluticasone, linaclotide, rosuvastatin, ACCRUFeR, apixaban,  sacubitril-valsartan, nitroGLYCERIN, carvedilol, gabapentin, carvedilol, and spironolactone. We  administered cyanocobalamin.  Meds ordered this encounter  Medications   cyanocobalamin (VITAMIN B12) injection 1,000 mcg     Follow-up: Return in about 6 months (around 06/27/2022).  [name], MD

## 2021-12-26 DIAGNOSIS — Z5111 Encounter for antineoplastic chemotherapy: Secondary | ICD-10-CM | POA: Diagnosis not present

## 2021-12-26 DIAGNOSIS — C678 Malignant neoplasm of overlapping sites of bladder: Secondary | ICD-10-CM | POA: Diagnosis not present

## 2022-01-02 DIAGNOSIS — C678 Malignant neoplasm of overlapping sites of bladder: Secondary | ICD-10-CM | POA: Diagnosis not present

## 2022-01-03 DIAGNOSIS — H02055 Trichiasis without entropian left lower eyelid: Secondary | ICD-10-CM | POA: Diagnosis not present

## 2022-01-04 ENCOUNTER — Encounter: Payer: Self-pay | Admitting: Internal Medicine

## 2022-01-05 ENCOUNTER — Encounter: Payer: Self-pay | Admitting: Internal Medicine

## 2022-01-05 DIAGNOSIS — Z5111 Encounter for antineoplastic chemotherapy: Secondary | ICD-10-CM | POA: Diagnosis not present

## 2022-01-05 DIAGNOSIS — C678 Malignant neoplasm of overlapping sites of bladder: Secondary | ICD-10-CM | POA: Diagnosis not present

## 2022-01-10 ENCOUNTER — Encounter: Payer: Self-pay | Admitting: Internal Medicine

## 2022-01-10 ENCOUNTER — Ambulatory Visit: Payer: HMO | Admitting: Internal Medicine

## 2022-01-10 VITALS — BP 142/48 | HR 52 | Ht 69.0 in | Wt 175.1 lb

## 2022-01-10 DIAGNOSIS — K648 Other hemorrhoids: Secondary | ICD-10-CM

## 2022-01-10 DIAGNOSIS — C679 Malignant neoplasm of bladder, unspecified: Secondary | ICD-10-CM | POA: Diagnosis not present

## 2022-01-10 NOTE — Progress Notes (Signed)
Dillon Young 88 y.o. 11-04-1932 [id number]  Assessment & Plan:   Encounter Diagnosis  Name Primary?   Internal hemorrhoids with complication Yes    The right posterior hemorrhoid is the largest, it appears to be a grade 2.  He does have smaller external components that are visible as well.  These are not causing problems, in my opinion.  He is reassured and we will observe and continue current treatment.  Sitz bath as recommended for future episodes like he had with the persistent swelling and discomfort.  He will return as needed.    Subjective:   Chief Complaint: Hemorrhoids  HPI  Dillon Young is an 88 year old white man with prior prostate cancer treated with radiation, chronic kidney disease, bladder cancer, colon polyps, CHF/CAD, strokes on Eliquis with a long history of internal hemorrhoids with prolapsing bleeding and symptoms, who presents after having swelling and aching pain especially with sitting in the past month or so.  He bought a cushion at a medical supply store and use that and he continues to use Anusol HC and Preparation H OTC creams, and takes Benefiber daily.  Those problems have resolved but he is concerned there may be a more serious issue.  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  He did have a little bit of increased straining to stool when this started.  He said the hemorrhoids do swell when he defecates but that resolves spontaneously.  No red rectal bleeding.  Lab Results  Component Value Date   WBC 4.7 10/18/2021   HGB 15.2 10/18/2021   HCT 46.3 10/18/2021   MCV 89.7 10/18/2021   PLT 148 (L) 10/18/2021    EGD 02/23/2021 - Tortuous esophagus. Dilated 54 Fr Maloney - Gastritis. Biopsied. - A single papule (nodule) found in  the stomach. Biopsied. - A single submucosal papule (nodule) found in the stomach. Small and innocent-appearing. - Normal examined duodenum.  Diagnosis 1. Surgical [P], gastric antrum - GASTRIC ANTRAL MUCOSA WITH MILD NONSPECIFIC REACTIVE GASTROPATHY - WARTHIN STARRY STAIN IS NEGATIVE FOR HELICOBACTER PYLORI 2. 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    Allergies  Allergen Reactions   Lipitor [Atorvastatin] Other (See Comments)    Muscle pain   Other Rash and Other (See Comments)    Detergent- Rashes on the skin   Current Meds  Medication Sig   acetaminophen (TYLENOL) 500 MG tablet Take 500 mg by mouth every 6 (six) hours as needed for mild pain or headache.   apixaban (ELIQUIS) 5 MG TABS tablet Take 1 tablet (5 mg total) by mouth 2 (two) times daily.   carvedilol (COREG) 3.125 MG tablet TAKE 1 TABLET BY MOUTH TWICE A DAY WITH A MEAL   carvedilol (COREG) 6.25 MG tablet Take 1.5 tablets (9.375 mg total) by mouth 2 (two) times daily with a meal.   Cholecalciferol (VITAMIN D-3) 25 MCG (1000 UT) CAPS Take 1,000 Units by mouth daily.  dofetilide (TIKOSYN) 125 MCG capsule TAKE 1 CAPSULE BY MOUTH TWICE DAILY   Ferric Maltol (ACCRUFER) 30 MG CAPS Take 1 capsule by mouth in the morning and at bedtime.   fexofenadine (ALLEGRA) 180 MG tablet Take 180 mg by mouth daily as needed for allergies or rhinitis.    fluticasone (FLONASE) 50 MCG/ACT nasal spray Place 1 spray into both nostrils daily.   gabapentin (NEURONTIN) 100 MG capsule Take 1 capsule (100 mg total) by mouth 3 (three)  times daily.   hydrocortisone (ANUSOL-HC) 2.5 % rectal cream Place 1 application rectally 3 (three) times daily as needed for hemorrhoids.   Hypromellose (ARTIFICIAL TEARS OP) Place 1 drop into both eyes 4 (four) times daily as needed (dry eyes).   ipratropium (ATROVENT) 0.06 % nasal spray Place 2 sprays into both nostrils 4 (four) times daily as needed for rhinitis.   levocetirizine (XYZAL) 5 MG tablet Take 1 tablet (5 mg total) by mouth every evening.   nitroGLYCERIN (NITROSTAT) 0.4 MG SL tablet Place 1 tablet (0.4 mg total) under the tongue every 5 (five) minutes as needed for chest pain.   rosuvastatin (CRESTOR) 10 MG tablet TAKE 1 TABLET BY MOUTH EVERY DAY   sacubitril-valsartan (ENTRESTO) 97-103 MG Take 1 tablet by mouth 2 (two) times daily.   spironolactone (ALDACTONE) 25 MG tablet TAKE 2 TABLETS BY MOUTH EVERY DAY   Tiotropium Bromide Monohydrate (SPIRIVA RESPIMAT) 2.5 MCG/ACT AERS Inhale 2 puffs into the lungs daily.   triamcinolone cream (KENALOG) 0.1 % Apply 1 application topically daily as needed (to affected areas, for itching).    Wheat Dextrin (BENEFIBER DRINK MIX PO) Take 2 Scoops by mouth daily in the afternoon. Take 2 tablespoons daily   Past Medical History:  Diagnosis Date   Adenomatous colon polyp    Allergic rhinitis    Anemia    Anxiety    Bladder cancer ([place])    currently on bcg treatments   BPH (benign prostatic hyperplasia)    CAD (coronary artery disease)    sees Dr. [name]    Carotid artery disease [facility])    CHF (congestive heart failure) ([place])    Chronic kidney disease    Sones. Prostate cancer   Clotting disorder (HCC)    CVA (cerebral infarction)    Diabetes mellitus    "Borderline"   Diverticulitis    Dysrhythmia    atrial fib - hx of    Edema    recent swelling in feet and ankles    GERD (gastroesophageal reflux disease)    history   Headache    Hemorrhoids    History of kidney stones    HTN (hypertension)    Hx of radiation therapy     for prostate - 2013    Hyperlipidemia LDL goal <70    IBS (irritable bowel syndrome)    Low back pain    Pre-diabetes    Prostate cancer ([place]) 2008   seed implantation   Sleep apnea    sitting in recliner helps him to breathe per pt , never used cpap sleep study - pt reports could not go to sleep- 6 years ago    Smoker    Stroke [facility])    hx of mini strokes years ago    Past Surgical History:  Procedure Laterality Date   APPENDECTOMY     CARDIAC CATHETERIZATION N/A 01/16/2016   Procedure: Left Heart Cath and Coronary Angiography;  Surgeon: Jettie Booze, MD;  Location: [place] [facility];  Service: Cardiovascular;  Laterality: N/A;   CARDIAC CATHETERIZATION N/A 01/16/2016   Procedure: Coronary Stent Intervention;  Surgeon: Jettie Booze, MD;  Location: [place] [facility];  Service: Cardiovascular;  Laterality: N/A;   CARDIOVERSION N/A 11/26/2017   Procedure: CARDIOVERSION;  Surgeon: [name], MD;  Location: [facility] ENDOSCOPY;  Service: Cardiovascular;  Laterality: N/A;   CARDIOVERSION N/A 03/13/2018   Procedure: CARDIOVERSION;  Surgeon: [name], MD;  Location: [facility] ENDOSCOPY;  Service: Cardiovascular;  Laterality: N/A;   CATARACT EXTRACTION W/ INTRAOCULAR LENS  IMPLANT, BILATERAL Bilateral    COLONOSCOPY  10/21/2008   diverticulosis, radiation proctitis, internal hemorrhoids   CORONARY ANGIOPLASTY     CORONARY STENT INTERVENTION N/A 12/26/2017   Procedure: CORONARY STENT INTERVENTION;  Surgeon: [name], MD;  Location: [place] [facility];  Service: Cardiovascular;  Laterality: N/A;   ENDARTERECTOMY  11/12/2011   Procedure: ENDARTERECTOMY CAROTID;  Surgeon: Rosetta Posner, MD;  Location: [place];  Service: Vascular;  Laterality: Right;   ESOPHAGOGASTRODUODENOSCOPY (EGD) WITH ESOPHAGEAL DILATION     HEMORRHOID BANDING     LEFT HEART CATH AND CORONARY ANGIOGRAPHY N/A 12/26/2017   Procedure: LEFT HEART CATH AND CORONARY ANGIOGRAPHY;  Surgeon: [name], MD;   Location: [place] [facility];  Service: Cardiovascular;  Laterality: N/A;   LITHOTRIPSY  X 3   LUMBAR LAMINECTOMY     TRANSURETHRAL RESECTION OF BLADDER TUMOR WITH MITOMYCIN-C Bilateral 01/06/2021   Procedure: TRANSURETHRAL RESECTION OF BLADDER TUMOR WITH GEMCITABINE BILATERAL RETROGRADES;  Surgeon: Irine Seal, MD;  Location: [facility];  Service: Urology;  Laterality: Bilateral;   TRANSURETHRAL RESECTION OF PROSTATE  10/2003   TRANSURETHRAL RESECTION OF PROSTATE N/A 05/31/2020   Procedure: CYSTOSCOPY TRANSURETHRAL RESECTION BIOPSY AND FULGURATION OF PROSTATE LESION  (TURP);  Surgeon: Irine Seal, MD;  Location: [facility];  Service: Urology;  Laterality: N/A;   Social History   Social History Narrative   Married second time   Retired [employer] yrs   2 daughters and 2 stepdaughters   family history includes Asthma in his paternal grandmother; Heart disease in his father; Hypertension in his mother.   Review of Systems  As per HPI Objective:   Physical Exam BP (!) 142/48   Pulse (!) 52   Ht '5\' 9"'$  (1.753 m)   Wt 175 lb 2 oz (79.4 kg)   BMI 25.86 kg/m   Rectal exam reveals external hemorrhoids most prominent in left lateral, digital exam is nontender without sign of fissure or mass.  Anoscopy is performed demonstrating a grade 2 l right posterior internal hemorrhoid component along with these external hemorrhoids that were seen and grade 1's right anterior and left lateral.  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

## 2022-01-10 NOTE — Patient Instructions (Signed)
Continue your current treatment plan and purchase a sitz bath if problems recur.  I appreciate the opportunity to care for you. Silvano Rusk, MD, [facility]

## 2022-01-22 ENCOUNTER — Telehealth: Payer: Self-pay | Admitting: Cardiovascular Disease

## 2022-01-22 NOTE — Telephone Encounter (Signed)
Pt c/o medication issue:  1. Name of Medication: apixaban (ELIQUIS) 5 MG TABS tablet  2. How are you currently taking this medication (dosage and times per day)?   3. Are you having a reaction (difficulty breathing--STAT)?   4. What is your medication issue? Pt is needing paperwork for this medication to be eligible through [place] and is requesting call back to discuss the type of paper work and when he can come and pick it up. Please advise.

## 2022-01-22 NOTE — Telephone Encounter (Signed)
Patient came into office advising that he spoke with [employer] this morning and they were sending over paperwork for him to review and pick up. I advised patient that Dr.Berry's nurse was out of office today and she would need to review to make sure Dr.Berry did not need to do anything as he was not in office today either.  Front desk made patient aware, advised to call and check in tomorrow.   Thanks!

## 2022-01-23 ENCOUNTER — Encounter: Payer: Self-pay | Admitting: Cardiovascular Disease

## 2022-01-23 NOTE — Telephone Encounter (Signed)
  Pt is calling back to f/u his pt assistance for his eliquis. He said, he is running out of meds

## 2022-01-26 NOTE — Telephone Encounter (Signed)
Spoke with pt regarding Dillon Young Pt assistance, pt did fill out an application for 1252 but did not do a new application for 7129, confirmed by Dillon Young. Printed new application for pt to fill out. Pt states he will pick up and return to [place] today. Pt verbalizes understanding.

## 2022-01-30 ENCOUNTER — Encounter: Payer: Self-pay | Admitting: Internal Medicine

## 2022-01-30 ENCOUNTER — Other Ambulatory Visit: Payer: Self-pay

## 2022-01-30 ENCOUNTER — Other Ambulatory Visit: Payer: Self-pay | Admitting: Internal Medicine

## 2022-01-30 ENCOUNTER — Other Ambulatory Visit: Payer: Self-pay | Admitting: Physician Assistant

## 2022-01-30 DIAGNOSIS — E1140 Type 2 diabetes mellitus with diabetic neuropathy, unspecified: Secondary | ICD-10-CM

## 2022-01-30 MED ORDER — GABAPENTIN 100 MG PO CAPS: 100.0000 mg | ORAL_CAPSULE | Freq: Three times a day (TID) | ORAL | 1 refills | Status: DC

## 2022-01-30 MED ORDER — APIXABAN 5 MG PO TABS: 5.0000 mg | ORAL_TABLET | Freq: Two times a day (BID) | ORAL | 3 refills | Status: DC

## 2022-01-30 NOTE — Telephone Encounter (Signed)
Pt assistance returned to office today. Completed and signed by Dr. [name]. Fax to [employer] with confirmation of receipt received.

## 2022-01-31 ENCOUNTER — Other Ambulatory Visit: Payer: Self-pay | Admitting: Internal Medicine

## 2022-01-31 DIAGNOSIS — I10 Essential (primary) hypertension: Secondary | ICD-10-CM

## 2022-01-31 DIAGNOSIS — I2510 Atherosclerotic heart disease of native coronary artery without angina pectoris: Secondary | ICD-10-CM

## 2022-01-31 DIAGNOSIS — I4819 Other persistent atrial fibrillation: Secondary | ICD-10-CM

## 2022-01-31 DIAGNOSIS — I519 Heart disease, unspecified: Secondary | ICD-10-CM

## 2022-01-31 MED ORDER — CARVEDILOL 3.125 MG PO TABS: 3.1250 mg | ORAL_TABLET | Freq: Two times a day (BID) | ORAL | 0 refills | Status: DC

## 2022-01-31 MED ORDER — CARVEDILOL 6.25 MG PO TABS: 9.3750 mg | ORAL_TABLET | Freq: Two times a day (BID) | ORAL | 0 refills | Status: DC

## 2022-02-01 ENCOUNTER — Telehealth (HOSPITAL_COMMUNITY): Payer: Self-pay | Admitting: *Deleted

## 2022-02-01 NOTE — Telephone Encounter (Signed)
Pt left vm requesting a call he has some questions about carvedilol. Called pt back he did not answer.

## 2022-02-02 NOTE — Telephone Encounter (Signed)
Called [employer] to check on pt's application. Pt is approved as of today until 05/27/22. Pt can expect first shipment of medication late next week.  Called pt and made him aware of this. Pt verbalizes understanding.

## 2022-02-05 ENCOUNTER — Other Ambulatory Visit: Payer: Self-pay

## 2022-02-05 NOTE — Telephone Encounter (Signed)
[facility] pharmacy calling requesting a new Rx be sent to their pharmacy. Please address

## 2022-02-06 ENCOUNTER — Encounter: Payer: Self-pay | Admitting: Internal Medicine

## 2022-02-06 ENCOUNTER — Other Ambulatory Visit: Payer: Self-pay | Admitting: Internal Medicine

## 2022-02-06 DIAGNOSIS — G4700 Insomnia, unspecified: Secondary | ICD-10-CM

## 2022-02-06 MED ORDER — QUVIVIQ 25 MG PO TABS: 1.0000 | ORAL_TABLET | Freq: Every day | ORAL | 1 refills | Status: DC

## 2022-02-08 ENCOUNTER — Ambulatory Visit (INDEPENDENT_AMBULATORY_CARE_PROVIDER_SITE_OTHER): Payer: HMO | Admitting: Nurse Practitioner

## 2022-02-08 VITALS — BP 170/80 | HR 68 | Temp 97.6°F | Ht 69.0 in | Wt 176.5 lb

## 2022-02-08 DIAGNOSIS — D649 Anemia, unspecified: Secondary | ICD-10-CM | POA: Diagnosis not present

## 2022-02-08 DIAGNOSIS — R519 Headache, unspecified: Secondary | ICD-10-CM | POA: Diagnosis not present

## 2022-02-08 LAB — FERRITIN: Ferritin: 37.6 ng/mL (ref 22.0–322.0)

## 2022-02-08 LAB — CBC
HCT: 44.2 % (ref 39.0–52.0)
Hemoglobin: 14.6 g/dL (ref 13.0–17.0)
MCHC: 33 g/dL (ref 30.0–36.0)
MCV: 87.6 fl (ref 78.0–100.0)
Platelets: 148 10*3/uL — ABNORMAL LOW (ref 150.0–400.0)
RBC: 5.05 Mil/uL (ref 4.22–5.81)
RDW: 14.1 % (ref 11.5–15.5)
WBC: 6.2 10*3/uL (ref 4.0–10.5)

## 2022-02-08 LAB — IRON: Iron: 142 ug/dL (ref 42–165)

## 2022-02-08 LAB — VITAMIN B12: Vitamin B-12: 332 pg/mL (ref 211–911)

## 2022-02-08 NOTE — Patient Instructions (Signed)
Take tylenol '1000mg'$  by mouth every 8 hours as needed for headache. Do not exceed '3000mg'$  in 24 hours.  Take your as needed hydrochlorothiazide when you get home. If in 1-2 hours after taking the medication your blood pressure is still >165/100 go to the emergency department.  Will order MRI of brain for further evaluation of headache Will check you iron, vitamin b12, and hemoglobin levels in your blood and will notify you of results. If your headache gets worse (severe pain, worsening dizziness, blurry/double vision, confusion, shortness of breath, difficulty feeling or moving any part of your body, difficulty swallowing or speaking call 911

## 2022-02-08 NOTE — Assessment & Plan Note (Signed)
Etiology unclear, blood pressure quite elevated today.  Patient reports taking hydrochlorothiazide daily as needed for systolic blood pressure greater than 160.  During office visit he reports headache resolved.  Recommend patient take his hydrochlorothiazide when he gets home, if blood pressure remains greater than 165/100 1 to 2 hours after taking hydrochlorothiazide he should proceed to the emergency department.  Patient reports understanding.  Otherwise vital signs stable, no other red flags other than age and history of cancer related to headache.  Will order MRI for further evaluation, may consider evaluation with neurology if headache persist despite negative MRI.

## 2022-02-08 NOTE — Assessment & Plan Note (Signed)
We will order CBC, iron, ferritin, and vitamin B12 level for further evaluation.  Further recommendations may be made based upon his results.

## 2022-02-08 NOTE — Progress Notes (Signed)
Established Patient Office Visit  Subjective   Patient ID: Dillon Young, male    DOB: 1932-11-17  Age: 88 y.o. MRN: [id number]  Chief Complaint  Patient presents with   Headache    On top of the head to the back, throbbing pain come and goes, it been affecting his sleep    Headache: Intermittent start about 1 week ago.  Reports he had a history of this approximately 1 year ago and had CT scan (per chart review CT scan completed 5 months ago showed no findings associated with headache).  7/10 in intensity, takes Tylenol 650 mg which helps a little bit.  He does have active bladder cancer and history of prostate cancer.  He does have hematuria in his urine which she believes is secondary to the radiation treatment for bladder cancer.  Denies blurry vision, double vision, numbness, weaknes, does have intermittent dizziness when standing up.  Reports dizziness feels like a off-balance sensation resolves on its own.  No obvious triggers the headache will last 3 to 4 hours at a time.  Iron deficiency anemia: Has been accurate for the past, has been off of it for approximately 1 month now.  He is concerned that his iron may be low which may be contributing to his headache.  He also reports history of vitamin B12 deficiency.  He is not currently taking iron supplementation or vitamin B12 supplementation.    Review of Systems  Constitutional:  Negative for fever.  Eyes:  Negative for blurred vision, double vision and photophobia.  Respiratory:  Negative for shortness of breath.   Cardiovascular:  Negative for chest pain.  Neurological:  Positive for dizziness and headaches. Negative for sensory change and weakness.      Objective:     BP (!) 182/80 (BP Location: Left Arm, Patient Position: Sitting, Cuff Size: Normal)   Pulse 68   Temp 97.6 F (36.4 C) (Oral)   Ht '5\' 9"'$  (1.753 m)   Wt 176 lb 8 oz (80.1 kg)   SpO2 97%   BMI 26.06 kg/m    Physical Exam Vitals reviewed.   Constitutional:      Appearance: Normal appearance.  HENT:     Head: Normocephalic and atraumatic.  Eyes:     General: No visual field deficit.    Pupils: Pupils are equal, round, and reactive to light.  Cardiovascular:     Rate and Rhythm: Normal rate and regular rhythm.  Pulmonary:     Effort: Pulmonary effort is normal.     Breath sounds: Normal breath sounds.  Musculoskeletal:     Cervical back: Neck supple.  Skin:    General: Skin is warm and dry.  Neurological:     Mental Status: He is alert and oriented to person, place, and time.     Sensory: Sensation is intact.     Motor: Motor function is intact.     Coordination: Coordination is intact.     Gait: Gait is intact.  Psychiatric:        Mood and Affect: Mood normal.        Behavior: Behavior normal.        Thought Content: Thought content normal.        Judgment: Judgment normal.      Results for orders placed or performed in visit on 02/08/22  Vitamin B12  Result Value Ref Range   Vitamin B-12 332 211 - 911 pg/mL  CBC  Result Value Ref Range  WBC 6.2 4.0 - 10.5 K/uL   RBC 5.05 4.22 - 5.81 Mil/uL   Platelets 148.0 (L) 150.0 - 400.0 K/uL   Hemoglobin 14.6 13.0 - 17.0 g/dL   HCT 44.2 39.0 - 52.0 %   MCV 87.6 78.0 - 100.0 fl   MCHC 33.0 30.0 - 36.0 g/dL   RDW 14.1 11.5 - 15.5 %  Ferritin  Result Value Ref Range   Ferritin 37.6 22.0 - 322.0 ng/mL  Iron  Result Value Ref Range   Iron 142 42 - 165 ug/dL      The ASCVD Risk score (Arnett DK, et al., 2019) failed to calculate for the following reasons:   The 2019 ASCVD risk score is only valid for ages 62 to 52    Assessment & Plan:   Problem List Items Addressed This Visit   None Visit Diagnoses     Anemia, unspecified type    -  Primary   Relevant Orders   Iron (Completed)   Ferritin (Completed)   CBC (Completed)   Vitamin B12 (Completed)   Acute intractable headache, unspecified headache type       Relevant Orders   MR Brain W Wo Contrast        Return in about 4 weeks (around 03/08/2022) for with Dr. Ronnald Ramp or as soon as possible with Dr. Ronnald Ramp.    Ailene Ards, NP

## 2022-02-10 ENCOUNTER — Emergency Department (HOSPITAL_BASED_OUTPATIENT_CLINIC_OR_DEPARTMENT_OTHER): Payer: HMO

## 2022-02-10 ENCOUNTER — Other Ambulatory Visit: Payer: Self-pay

## 2022-02-10 ENCOUNTER — Emergency Department (HOSPITAL_BASED_OUTPATIENT_CLINIC_OR_DEPARTMENT_OTHER)
Admission: EM | Admit: 2022-02-10 | Discharge: 2022-02-10 | Disposition: A | Discharge status: Home / Self Care | Payer: HMO | Attending: Emergency Medicine | Admitting: Emergency Medicine

## 2022-02-10 ENCOUNTER — Encounter (HOSPITAL_BASED_OUTPATIENT_CLINIC_OR_DEPARTMENT_OTHER): Payer: Self-pay

## 2022-02-10 DIAGNOSIS — I6509 Occlusion and stenosis of unspecified vertebral artery: Secondary | ICD-10-CM | POA: Diagnosis not present

## 2022-02-10 DIAGNOSIS — I708 Atherosclerosis of other arteries: Secondary | ICD-10-CM | POA: Diagnosis not present

## 2022-02-10 DIAGNOSIS — I700 Atherosclerosis of aorta: Secondary | ICD-10-CM | POA: Diagnosis not present

## 2022-02-10 DIAGNOSIS — N189 Chronic kidney disease, unspecified: Secondary | ICD-10-CM | POA: Insufficient documentation

## 2022-02-10 DIAGNOSIS — I6381 Other cerebral infarction due to occlusion or stenosis of small artery: Secondary | ICD-10-CM | POA: Diagnosis not present

## 2022-02-10 DIAGNOSIS — R519 Headache, unspecified: Secondary | ICD-10-CM | POA: Diagnosis not present

## 2022-02-10 DIAGNOSIS — G4452 New daily persistent headache (NDPH): Secondary | ICD-10-CM | POA: Diagnosis not present

## 2022-02-10 DIAGNOSIS — E1122 Type 2 diabetes mellitus with diabetic chronic kidney disease: Secondary | ICD-10-CM | POA: Insufficient documentation

## 2022-02-10 DIAGNOSIS — I2510 Atherosclerotic heart disease of native coronary artery without angina pectoris: Secondary | ICD-10-CM | POA: Diagnosis not present

## 2022-02-10 DIAGNOSIS — I6529 Occlusion and stenosis of unspecified carotid artery: Secondary | ICD-10-CM

## 2022-02-10 DIAGNOSIS — I672 Cerebral atherosclerosis: Secondary | ICD-10-CM | POA: Diagnosis not present

## 2022-02-10 DIAGNOSIS — I130 Hypertensive heart and chronic kidney disease with heart failure and stage 1 through stage 4 chronic kidney disease, or unspecified chronic kidney disease: Secondary | ICD-10-CM | POA: Diagnosis not present

## 2022-02-10 DIAGNOSIS — Z7901 Long term (current) use of anticoagulants: Secondary | ICD-10-CM | POA: Insufficient documentation

## 2022-02-10 DIAGNOSIS — Z79899 Other long term (current) drug therapy: Secondary | ICD-10-CM | POA: Insufficient documentation

## 2022-02-10 DIAGNOSIS — I509 Heart failure, unspecified: Secondary | ICD-10-CM | POA: Insufficient documentation

## 2022-02-10 DIAGNOSIS — K627 Radiation proctitis: Secondary | ICD-10-CM | POA: Diagnosis not present

## 2022-02-10 LAB — CBC WITH DIFFERENTIAL/PLATELET
Abs Immature Granulocytes: 0.01 10*3/uL (ref 0.00–0.07)
Basophils Absolute: 0 10*3/uL (ref 0.0–0.1)
Basophils Relative: 1 %
Eosinophils Absolute: 0.1 10*3/uL (ref 0.0–0.5)
Eosinophils Relative: 1 %
HCT: 42.6 % (ref 39.0–52.0)
Hemoglobin: 14.3 g/dL (ref 13.0–17.0)
Immature Granulocytes: 0 %
Lymphocytes Relative: 16 %
Lymphs Abs: 0.8 10*3/uL (ref 0.7–4.0)
MCH: 29.2 pg (ref 26.0–34.0)
MCHC: 33.6 g/dL (ref 30.0–36.0)
MCV: 87.1 fL (ref 80.0–100.0)
Monocytes Absolute: 0.5 10*3/uL (ref 0.1–1.0)
Monocytes Relative: 10 %
Neutro Abs: 3.6 10*3/uL (ref 1.7–7.7)
Neutrophils Relative %: 72 %
Platelets: 133 10*3/uL — ABNORMAL LOW (ref 150–400)
RBC: 4.89 MIL/uL (ref 4.22–5.81)
RDW: 13.7 % (ref 11.5–15.5)
WBC: 4.9 10*3/uL (ref 4.0–10.5)
nRBC: 0 % (ref 0.0–0.2)

## 2022-02-10 LAB — BASIC METABOLIC PANEL
Anion gap: 11 (ref 5–15)
BUN: 18 mg/dL (ref 8–23)
CO2: 25 mmol/L (ref 22–32)
Calcium: 9.4 mg/dL (ref 8.9–10.3)
Chloride: 106 mmol/L (ref 98–111)
Creatinine, Ser: 0.87 mg/dL (ref 0.61–1.24)
GFR, Estimated: >60 mL/min (ref >60)
Glucose, Bld: 129 mg/dL — ABNORMAL HIGH (ref 70–99)
Potassium: 3.9 mmol/L (ref 3.5–5.1)
Sodium: 142 mmol/L (ref 135–145)

## 2022-02-10 LAB — PROTIME-INR
INR: 1.2 (ref 0.8–1.2)
Prothrombin Time: 15.3 seconds — ABNORMAL HIGH (ref 11.4–15.2)

## 2022-02-10 MED ORDER — METOCLOPRAMIDE HCL 5 MG/ML IJ SOLN
10.0000 mg | Freq: Once | INTRAMUSCULAR | Status: AC
  Administered 2022-02-10: 10 mg via INTRAVENOUS
  Filled 2022-02-10: qty 2

## 2022-02-10 MED ORDER — IOHEXOL 300 MG/ML  SOLN
75.0000 mL | Freq: Once | INTRAMUSCULAR | Status: AC | PRN
  Administered 2022-02-10: 75 mL via INTRAVENOUS

## 2022-02-10 MED ORDER — SODIUM CHLORIDE 0.9 % IV BOLUS
500.0000 mL | Freq: Once | INTRAVENOUS | Status: AC
  Administered 2022-02-10: 500 mL via INTRAVENOUS

## 2022-02-10 MED ORDER — ACETAMINOPHEN 500 MG PO TABS
1000.0000 mg | ORAL_TABLET | Freq: Once | ORAL | Status: AC
  Administered 2022-02-10: 1000 mg via ORAL
  Filled 2022-02-10: qty 2

## 2022-02-10 MED ORDER — DIPHENHYDRAMINE HCL 50 MG/ML IJ SOLN
12.5000 mg | Freq: Once | INTRAMUSCULAR | Status: AC
  Administered 2022-02-10: 12.5 mg via INTRAVENOUS
  Filled 2022-02-10: qty 1

## 2022-02-10 MED ORDER — SODIUM CHLORIDE 0.9 % IV SOLN: INTRAVENOUS | Status: DC

## 2022-02-10 NOTE — Discharge Instructions (Addendum)
Recommend you follow-up with your PCP regarding your headache. You could benefit from outpatient neurology referral. Your CT head was unremarkable. Continue 1g Tylenol every 6 hours for headache management.   Your CTA showed known plaque buildup: IMPRESSION:  1. Negative for large vessel occlusion.    2. Extensive atherosclerosis in the neck and at the skull base:  Previous Right carotid endarterectomy  with no adverse features.  Extensive Left carotid atherosclerosis, but no significant stenosis  in the neck. Mild to moderate Left supraclinoid ICA siphon stenosis.  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  Dominant Left Vertebral Artery with no significant stenosis.    3. Aortic Atherosclerosis (ICD10-I70.0) and Emphysema (ICD10-J43.9).

## 2022-02-10 NOTE — ED Provider Notes (Signed)
[facility] DE[facility] Provider Note   CSN: [id number] Arrival date & time: 02/10/22  0488     History  Chief Complaint  Patient presents with   Headache    Dillon Young is a 88 y.o. male.   Headache Associated symptoms: no photophobia      88 year old male with medical history significant for HTN, CAD, CVA, IBS, DM 2, CKD, CHF, atrial fibrillation on Eliquis has been compliant with the medication who presents to the emergency department with a headache.  The patient states that for the past 2 weeks he has had a dull headache.  He denies any sudden onset or maximal onset of his headache.  He states that it is come on gradually over the past 2 weeks, located posteriorly in his head, radiating to the front.  He denies any fevers, chills, neck stiffness.  He does endorse some left shoulder pain which he thought was his osteoarthritis flaring up.  He denies any chest pain or shortness of breath.  He took his blood pressure at home and it was elevated.  Is also seen by his PCP on 02/08/2022.  At his PCP office, he had been advised to present to the emergency department for worsening symptoms.  He currently denies any blurry vision, double vision, numbness, focal weakness.  He has some lightheadedness upon standing.  He does occasionally feel slightly off balance.  No known triggers.  He denies any recent falls or trauma.  Home Medications Prior to Admission medications   Medication Sig Start Date End Date Taking? Authorizing Provider  acetaminophen (TYLENOL) 500 MG tablet Take 500 mg by mouth every 6 (six) hours as needed for mild pain or headache.    [provider]  apixaban (ELIQUIS) 5 MG TABS tablet Take 1 tablet (5 mg total) by mouth 2 (two) times daily. 01/30/22   [name], MD  carvedilol (COREG) 3.125 MG tablet Take 1 tablet (3.125 mg total) by mouth 2 (two) times daily with a meal. 01/31/22   [name], MD  carvedilol (COREG) 6.25 MG tablet  Take 1.5 tablets (9.375 mg total) by mouth 2 (two) times daily with a meal. 01/31/22   [name], MD  Cholecalciferol (VITAMIN D-3) 25 MCG (1000 UT) CAPS Take 1,000 Units by mouth daily.    [provider]  Daridorexant HCl (QUVIVIQ) 25 MG TABS Take 1 tablet by mouth at bedtime. 02/06/22   [name], MD  dofetilide (TIKOSYN) 125 MCG capsule TAKE 1 CAPSULE BY MOUTH TWICE DAILY 02/27/21   Bensimhon, Shaune Pascal, MD  Ferric Maltol (ACCRUFER) 30 MG CAPS Take 1 capsule by mouth in the morning and at bedtime. 09/28/21   [name], MD  fexofenadine (ALLEGRA) 180 MG tablet Take 180 mg by mouth daily as needed for allergies or rhinitis.     [provider]  fluticasone (FLONASE) 50 MCG/ACT nasal spray Place 1 spray into both nostrils daily.    [provider]  gabapentin (NEURONTIN) 100 MG capsule Take 1 capsule (100 mg total) by mouth 3 (three) times daily. 01/30/22   [name], MD  hydrochlorothiazide (HYDRODIURIL) 25 MG tablet Take 25 mg by mouth daily as needed. For systolic BP >891    [provider]  hydrocortisone (ANUSOL-HC) 2.5 % rectal cream Place 1 application rectally 3 (three) times daily as needed for hemorrhoids. 02/21/21   Gatha Mayer, MD  Hypromellose (ARTIFICIAL TEARS OP) Place 1 drop into both eyes 4 (  four) times daily as needed (dry eyes).    [provider]  ipratropium (ATROVENT) 0.06 % nasal spray Place 2 sprays into both nostrils 4 (four) times daily as needed for rhinitis. 07/28/21   Freddi Starr, MD  levocetirizine (XYZAL) 5 MG tablet Take 1 tablet (5 mg total) by mouth every evening. 06/26/21   [name], MD  nitroGLYCERIN (NITROSTAT) 0.4 MG SL tablet Place 1 tablet (0.4 mg total) under the tongue every 5 (five) minutes as needed for chest pain. 10/18/21   Larey Dresser, MD  rosuvastatin (CRESTOR) 10 MG tablet TAKE 1 TABLET BY MOUTH EVERY DAY 09/09/21   [name], MD  sacubitril-valsartan (ENTRESTO) 97-103  MG Take 1 tablet by mouth 2 (two) times daily. 10/17/21   [name], MD  spironolactone (ALDACTONE) 25 MG tablet TAKE 2 TABLETS BY MOUTH EVERY DAY 12/14/21   Larey Dresser, MD  Tiotropium Bromide Monohydrate (SPIRIVA RESPIMAT) 2.5 MCG/ACT AERS Inhale 2 puffs into the lungs daily. 07/28/21   Freddi Starr, MD  triamcinolone cream (KENALOG) 0.1 % Apply 1 application topically daily as needed (to affected areas, for itching).  01/06/19   [provider]  Wheat Dextrin (BENEFIBER DRINK MIX PO) Take 2 Scoops by mouth daily in the afternoon. Take 2 tablespoons daily    [provider]      Allergies    Lipitor [atorvastatin] and Other    Review of Systems   Review of Systems  Eyes:  Negative for photophobia and visual disturbance.  Neurological:  Positive for light-headedness and headaches.  All other systems reviewed and are negative.   Physical Exam Updated Vital Signs BP (!) 156/95 (BP Location: Right Arm)   Pulse 77   Temp 97.7 F (36.5 C)   Resp 19   Ht '5\' 9"'$  (1.753 m)   Wt 80.3 kg   SpO2 100%   BMI 26.14 kg/m  Physical Exam Vitals and nursing note reviewed.  Constitutional:      General: He is not in acute distress.    Appearance: He is well-developed.  HENT:     Head: Normocephalic and atraumatic.  Eyes:     Conjunctiva/sclera: Conjunctivae normal.  Cardiovascular:     Rate and Rhythm: Normal rate and regular rhythm.     Heart sounds: No murmur heard. Pulmonary:     Effort: Pulmonary effort is normal. No respiratory distress.     Breath sounds: Normal breath sounds.  Abdominal:     Palpations: Abdomen is soft.     Tenderness: There is no abdominal tenderness.  Musculoskeletal:        General: No swelling.     Cervical back: Neck supple.  Skin:    General: Skin is warm and dry.     Capillary Refill: Capillary refill takes less than 2 seconds.  Neurological:     Mental Status: He is alert.     GCS: GCS eye subscore is 4. GCS verbal  subscore is 5. GCS motor subscore is 6.     Comments: MENTAL STATUS EXAM:    Orientation: Alert and oriented to person, place and time.  Memory: Cooperative, follows commands well.  Language: Speech is clear and language is normal.   CRANIAL NERVES:    CN 2 (Optic): Visual fields intact to confrontation.  CN 3,4,6 (EOM): Pupils equal and reactive to light. Full extraocular eye movement without nystagmus.  CN 5 (Trigeminal): Facial sensation is normal, no weakness of masticatory muscles.  CN 7 (Facial): No facial weakness or asymmetry.  CN 8 (Auditory): Auditory acuity grossly normal.  CN 9,10 (Glossophar): The uvula is midline, the palate elevates symmetrically.  CN 11 (spinal access): Normal sternocleidomastoid and trapezius strength.  CN 12 (Hypoglossal): The tongue is midline. No atrophy or fasciculations.Marland Kitchen   MOTOR:  Muscle Strength: 5/5RUE, 5/5LUE, 5/5RLE, 5/5LLE.   COORDINATION:   No tremor.   SENSATION:   Intact to light touch all four extremities.    Psychiatric:        Mood and Affect: Mood normal.     ED Results / Procedures / Treatments   Labs (all labs ordered are listed, but only abnormal results are displayed) Labs Reviewed - No data to display  EKG None  Radiology No results found.  Procedures Ultrasound ED Peripheral IV (Provider)  Date/Time: 02/10/2022 11:08 AM  Performed by: Regan Lemming, MD Authorized by: Regan Lemming, MD   Procedure details:    Indications: multiple failed IV attempts     Skin Prep: chlorhexidine gluconate     Location:  Right AC   Angiocath:  20 G   Bedside Ultrasound Guided: Yes     Images: not archived     Patient tolerated procedure without complications: Yes     Dressing applied: Yes       Medications Ordered in ED Medications - No data to display  ED Course/ Medical Decision Making/ A&P Clinical Course as of 02/10/22 1107  Sat Feb 10, 2022  1013 BP(!): 135/50 [JL]    Clinical Course User Index [JL]  Regan Lemming, MD                           Medical Decision Making Amount and/or Complexity of Data Reviewed Labs: ordered. Radiology: ordered.  Risk OTC drugs. Prescription drug management.   88 year old male with medical history significant for HTN, CAD, CVA, IBS, DM 2, CKD, CHF, atrial fibrillation on Eliquis has been compliant with the medication who presents to the emergency department with a headache.  The patient states that for the past 2 weeks he has had a dull headache.  He denies any sudden onset or maximal onset of his headache.  He states that it is come on gradually over the past 2 weeks, located posteriorly in his head, radiating to the front.  He denies any fevers, chills, neck stiffness.  He does endorse some left shoulder pain which he thought was his osteoarthritis flaring up.  He denies any chest pain or shortness of breath.  He took his blood pressure at home and it was elevated.  Is also seen by his PCP on 02/08/2022.  At his PCP office, he had been advised to present to the emergency department for worsening symptoms.  He currently denies any blurry vision, double vision, numbness, focal weakness.  He has some lightheadedness upon standing.  He does occasionally feel slightly off balance.  No known triggers.  He denies any recent falls or trauma.    {Document critical care time when appropriate:1} {Document review of labs and clinical decision tools ie heart score, Chads2Vasc2 etc:1}  {Document your independent review of radiology images, and any outside records:1} {Document your discussion with family members, caretakers, and with consultants:1} {Document social determinants of health affecting pt's care:1} {Document your decision making why or why not admission, treatments were needed:1} Final Clinical Impression(s) / ED Diagnoses Final diagnoses:  None    Rx / DC Orders ED Discharge  Orders     None

## 2022-02-10 NOTE — ED Triage Notes (Signed)
Pt states that for 1 week, he has had a headache. Pt states that his BP has been running high, 644I systolic. Pt states that he last took hydralazine at 0700 today.

## 2022-02-13 ENCOUNTER — Encounter (HOSPITAL_COMMUNITY): Payer: HMO

## 2022-02-18 ENCOUNTER — Other Ambulatory Visit: Payer: Self-pay | Admitting: Internal Medicine

## 2022-02-18 DIAGNOSIS — E785 Hyperlipidemia, unspecified: Secondary | ICD-10-CM

## 2022-02-20 ENCOUNTER — Ambulatory Visit
Admission: RE | Admit: 2022-02-20 | Discharge: 2022-02-20 | Disposition: A | Discharge status: Home / Self Care | Payer: HMO | Source: Ambulatory Visit | Attending: Nurse Practitioner | Admitting: Nurse Practitioner

## 2022-02-20 DIAGNOSIS — Z8551 Personal history of malignant neoplasm of bladder: Secondary | ICD-10-CM | POA: Diagnosis not present

## 2022-02-20 DIAGNOSIS — Z8546 Personal history of malignant neoplasm of prostate: Secondary | ICD-10-CM | POA: Diagnosis not present

## 2022-02-20 DIAGNOSIS — G319 Degenerative disease of nervous system, unspecified: Secondary | ICD-10-CM | POA: Diagnosis not present

## 2022-02-20 DIAGNOSIS — R519 Headache, unspecified: Secondary | ICD-10-CM

## 2022-02-20 DIAGNOSIS — I611 Nontraumatic intracerebral hemorrhage in hemisphere, cortical: Secondary | ICD-10-CM | POA: Diagnosis not present

## 2022-02-20 MED ORDER — GADOBENATE DIMEGLUMINE 529 MG/ML IV SOLN
16.0000 mL | Freq: Once | INTRAVENOUS | Status: AC | PRN
  Administered 2022-02-20: 16 mL via INTRAVENOUS

## 2022-02-21 DIAGNOSIS — H02054 Trichiasis without entropian left upper eyelid: Secondary | ICD-10-CM | POA: Diagnosis not present

## 2022-02-21 DIAGNOSIS — H02055 Trichiasis without entropian left lower eyelid: Secondary | ICD-10-CM | POA: Diagnosis not present

## 2022-02-25 ENCOUNTER — Other Ambulatory Visit: Payer: HMO

## 2022-03-02 ENCOUNTER — Ambulatory Visit (INDEPENDENT_AMBULATORY_CARE_PROVIDER_SITE_OTHER): Payer: HMO

## 2022-03-02 DIAGNOSIS — Z23 Encounter for immunization: Secondary | ICD-10-CM | POA: Diagnosis not present

## 2022-03-16 ENCOUNTER — Other Ambulatory Visit: Payer: Self-pay | Admitting: Physician Assistant

## 2022-03-19 ENCOUNTER — Ambulatory Visit (INDEPENDENT_AMBULATORY_CARE_PROVIDER_SITE_OTHER): Payer: HMO | Admitting: Internal Medicine

## 2022-03-19 ENCOUNTER — Ambulatory Visit (INDEPENDENT_AMBULATORY_CARE_PROVIDER_SITE_OTHER): Payer: HMO

## 2022-03-19 ENCOUNTER — Encounter: Payer: Self-pay | Admitting: Internal Medicine

## 2022-03-19 VITALS — BP 130/68 | HR 67 | Temp 97.7°F | Ht 69.0 in | Wt 173.5 lb

## 2022-03-19 DIAGNOSIS — R001 Bradycardia, unspecified: Secondary | ICD-10-CM

## 2022-03-19 DIAGNOSIS — I10 Essential (primary) hypertension: Secondary | ICD-10-CM

## 2022-03-19 DIAGNOSIS — R5383 Other fatigue: Secondary | ICD-10-CM

## 2022-03-19 DIAGNOSIS — R052 Subacute cough: Secondary | ICD-10-CM

## 2022-03-19 DIAGNOSIS — I4819 Other persistent atrial fibrillation: Secondary | ICD-10-CM | POA: Diagnosis not present

## 2022-03-19 DIAGNOSIS — E119 Type 2 diabetes mellitus without complications: Secondary | ICD-10-CM

## 2022-03-19 DIAGNOSIS — G4700 Insomnia, unspecified: Secondary | ICD-10-CM

## 2022-03-19 MED ORDER — CYANOCOBALAMIN 1000 MCG/ML IJ SOLN
1000.0000 ug | Freq: Once | INTRAMUSCULAR | Status: AC
  Administered 2022-03-19: 1000 ug via INTRAMUSCULAR

## 2022-03-19 NOTE — Progress Notes (Signed)
Subjective:  Patient ID: Dillon Young, male    DOB: 1933/02/24  Age: 88 y.o. MRN: [id number]  CC: Cough   HPI Dillon Young presents for f/up -  He complains of a several week history of cough productive of clear phlegm.  He has a cough day and night and has noticed some wheezing.  He was recently seen at an urgent care center.  A chest x-ray was not done.  He was treated with prednisone and doxycycline.  He was prescribed a cough medicine but it made his symptoms worse.  Outpatient Medications Prior to Visit  Medication Sig Dispense Refill   acetaminophen (TYLENOL) 500 MG tablet Take 500 mg by mouth every 6 (six) hours as needed for mild pain or headache.     apixaban (ELIQUIS) 5 MG TABS tablet Take 1 tablet (5 mg total) by mouth 2 (two) times daily. 180 tablet 3   carvedilol (COREG) 3.125 MG tablet Take 1 tablet (3.125 mg total) by mouth 2 (two) times daily with a meal. 180 tablet 0   carvedilol (COREG) 6.25 MG tablet Take 1.5 tablets (9.375 mg total) by mouth 2 (two) times daily with a meal. 180 tablet 0   Cholecalciferol (VITAMIN D-3) 25 MCG (1000 UT) CAPS Take 1,000 Units by mouth daily.     Daridorexant HCl (QUVIVIQ) 25 MG TABS Take 1 tablet by mouth at bedtime. 90 tablet 1   dicyclomine (BENTYL) 10 MG capsule TAKE 1 CAPSULE (10 MG TOTAL) BY MOUTH EVERY 6 (SIX) HOURS AS NEEDED (ABDOMINAL PAIN). 30 capsule 1   dofetilide (TIKOSYN) 125 MCG capsule TAKE 1 CAPSULE BY MOUTH TWICE DAILY 180 capsule 3   Ferric Maltol (ACCRUFER) 30 MG CAPS Take 1 capsule by mouth in the morning and at bedtime. 180 capsule 0   fexofenadine (ALLEGRA) 180 MG tablet Take 180 mg by mouth daily as needed for allergies or rhinitis.      fluticasone (FLONASE) 50 MCG/ACT nasal spray Place 1 spray into both nostrils daily.     gabapentin (NEURONTIN) 100 MG capsule Take 1 capsule (100 mg total) by mouth 3 (three) times daily. 270 capsule 1   hydrALAZINE (APRESOLINE) 25 MG tablet Take 25 mg by mouth 3 (three)  times daily. As need when BP is 160     hydrocortisone (ANUSOL-HC) 2.5 % rectal cream Place 1 application rectally 3 (three) times daily as needed for hemorrhoids. 30 g 1   Hypromellose (ARTIFICIAL TEARS OP) Place 1 drop into both eyes 4 (four) times daily as needed (dry eyes).     ipratropium (ATROVENT) 0.06 % nasal spray Place 2 sprays into both nostrils 4 (four) times daily as needed for rhinitis. 90 mL 1   levocetirizine (XYZAL) 5 MG tablet Take 1 tablet (5 mg total) by mouth every evening. 90 tablet 1   nitroGLYCERIN (NITROSTAT) 0.4 MG SL tablet Place 1 tablet (0.4 mg total) under the tongue every 5 (five) minutes as needed for chest pain. 25 tablet 3   rosuvastatin (CRESTOR) 10 MG tablet TAKE 1 TABLET BY MOUTH EVERY DAY 90 tablet 1   sacubitril-valsartan (ENTRESTO) 97-103 MG Take 1 tablet by mouth 2 (two) times daily. 180 tablet 2   spironolactone (ALDACTONE) 25 MG tablet TAKE 2 TABLETS BY MOUTH EVERY DAY 180 tablet 3   Tiotropium Bromide Monohydrate (SPIRIVA RESPIMAT) 2.5 MCG/ACT AERS Inhale 2 puffs into the lungs daily. 4 g 11   triamcinolone cream (KENALOG) 0.1 % Apply 1 application topically daily as needed (to  affected areas, for itching).      Wheat Dextrin (BENEFIBER DRINK MIX PO) Take 2 Scoops by mouth daily in the afternoon. Take 2 tablespoons daily     hydrochlorothiazide (HYDRODIURIL) 25 MG tablet Take 25 mg by mouth daily as needed. For systolic BP >086     No facility-administered medications prior to visit.    ROS Review of Systems  Constitutional:  Negative for appetite change, chills, diaphoresis, fatigue, fever and unexpected weight change.  HENT:  Positive for voice change. Negative for trouble swallowing.   Eyes: Negative.   Respiratory:  Positive for cough. Negative for chest tightness, shortness of breath and wheezing.   Cardiovascular:  Negative for chest pain, palpitations and leg swelling.  Gastrointestinal:  Negative for abdominal pain, constipation, diarrhea,  nausea and vomiting.  Endocrine: Negative.   Genitourinary: Negative.  Negative for difficulty urinating.  Musculoskeletal: Negative.   Skin: Negative.   Neurological:  Negative for dizziness, weakness and light-headedness.  Hematological:  Negative for adenopathy. Does not bruise/bleed easily.  Psychiatric/Behavioral: Negative.      Objective:  BP 130/68   Pulse 67   Temp 97.7 F (36.5 C) (Oral)   Ht '5\' 9"'$  (1.753 m)   Wt 173 lb 8 oz (78.7 kg)   SpO2 97%   BMI 25.62 kg/m   BP Readings from Last 3 Encounters:  03/19/22 130/68  02/10/22 (!) 144/59  02/08/22 (!) 170/80    Wt Readings from Last 3 Encounters:  03/19/22 173 lb 8 oz (78.7 kg)  02/10/22 177 lb (80.3 kg)  02/08/22 176 lb 8 oz (80.1 kg)    Physical Exam Constitutional:      Appearance: Normal appearance. He is not ill-appearing.  HENT:     Nose: Nose normal.     Mouth/Throat:     Mouth: Mucous membranes are moist.  Eyes:     General: No scleral icterus.    Conjunctiva/sclera: Conjunctivae normal.  Cardiovascular:     Rate and Rhythm: Normal rate. Rhythm irregularly irregular.     Heart sounds: No murmur heard. Pulmonary:     Effort: Pulmonary effort is normal. No tachypnea, accessory muscle usage or respiratory distress.     Breath sounds: No decreased breath sounds, wheezing, rhonchi or rales.  Abdominal:     General: Abdomen is flat.     Palpations: There is no mass.     Tenderness: There is no abdominal tenderness. There is no guarding.     Hernia: No hernia is present.  Musculoskeletal:        General: Normal range of motion.     Cervical back: Neck supple.     Right lower leg: No edema.     Left lower leg: No edema.  Lymphadenopathy:     Cervical: No cervical adenopathy.  Skin:    General: Skin is dry.  Neurological:     General: No focal deficit present.     Mental Status: He is alert.  Psychiatric:        Mood and Affect: Mood normal.        Behavior: Behavior normal.     Lab  Results  Component Value Date   WBC 4.9 02/10/2022   HGB 14.3 02/10/2022   HCT 42.6 02/10/2022   PLT 133 (L) 02/10/2022   GLUCOSE 129 (H) 02/10/2022   CHOL 129 10/18/2021   TRIG 118 10/18/2021   HDL 45 10/18/2021   LDLCALC 60 10/18/2021   ALT 9 06/30/2020   AST 12 06/30/2020  NA 142 02/10/2022   K 3.9 02/10/2022   CL 106 02/10/2022   CREATININE 0.87 02/10/2022   BUN 18 02/10/2022   CO2 25 02/10/2022   TSH 2.28 03/07/2021   PSA 0.015 12/21/2020   INR 1.2 02/10/2022   HGBA1C 6.4 08/24/2021   MICROALBUR 5.7 01/06/2020    MR Brain W Wo Contrast  Result Date: 02/21/2022 CLINICAL DATA:  Headaches for 2 weeks. History of prostate and bladder cancer. EXAM: MRI HEAD WITHOUT AND WITH CONTRAST TECHNIQUE: Multiplanar, multiecho pulse sequences of the brain and surrounding structures were obtained without and with intravenous contrast. CONTRAST:  101m MULTIHANCE GADOBENATE DIMEGLUMINE 529 MG/ML IV SOLN COMPARISON:  Head CT and head and neck CTA 02/10/2022. Head MRI 01/12/2020. FINDINGS: Brain: There is no evidence of an acute infarct, mass, midline shift, or extra-axial fluid collection. A chronic microhemorrhage is again noted in the right parietal lobe. Small T2 hyperintensities in the cerebral white matter bilaterally have slightly progressed from the prior MRI and are nonspecific but compatible with mild chronic small vessel ischemic disease. Chronic lacunar infarcts are present in the basal ganglia and left thalamus. A chronic lacunar infarct in the left corona radiata is new from the prior MRI. There is mild cerebral atrophy. No abnormal enhancement is identified. Vascular: Major intracranial vascular flow voids are preserved. Skull and upper cervical spine: Unremarkable bone marrow signal. Sinuses/Orbits: Bilateral cataract extraction. Clear paranasal sinuses. Small right mastoid effusion. Other: None. IMPRESSION: 1. No acute intracranial abnormality or mass. 2. Mild chronic small vessel  ischemic disease. Electronically Signed   By: ALogan BoresM.D.   On: 02/21/2022 19:21   DG Chest 2 View  Result Date: 03/19/2022 CLINICAL DATA:  Cough productive of clear phlegm EXAM: CHEST - 2 VIEW COMPARISON:  08/24/2021 FINDINGS: Normal heart size, mediastinal contours, and pulmonary vascularity. Atherosclerotic calcification aorta. Questionable coronary arterial calcification. Lungs clear. No pulmonary infiltrate, pleural effusion, or pneumothorax. Bones demineralized. IMPRESSION: No acute abnormalities. Questionable coronary arterial calcification. Aortic Atherosclerosis (ICD10-I70.0). Electronically Signed   By: MLavonia DanaM.D.   On: 03/19/2022 08:56   MR Brain W Wo Contrast  Result Date: 02/21/2022 CLINICAL DATA:  Headaches for 2 weeks. History of prostate and bladder cancer. EXAM: MRI HEAD WITHOUT AND WITH CONTRAST TECHNIQUE: Multiplanar, multiecho pulse sequences of the brain and surrounding structures were obtained without and with intravenous contrast. CONTRAST:  184mMULTIHANCE GADOBENATE DIMEGLUMINE 529 MG/ML IV SOLN COMPARISON:  Head CT and head and neck CTA 02/10/2022. Head MRI 01/12/2020. FINDINGS: Brain: There is no evidence of an acute infarct, mass, midline shift, or extra-axial fluid collection. A chronic microhemorrhage is again noted in the right parietal lobe. Small T2 hyperintensities in the cerebral white matter bilaterally have slightly progressed from the prior MRI and are nonspecific but compatible with mild chronic small vessel ischemic disease. Chronic lacunar infarcts are present in the basal ganglia and left thalamus. A chronic lacunar infarct in the left corona radiata is new from the prior MRI. There is mild cerebral atrophy. No abnormal enhancement is identified. Vascular: Major intracranial vascular flow voids are preserved. Skull and upper cervical spine: Unremarkable bone marrow signal. Sinuses/Orbits: Bilateral cataract extraction. Clear paranasal sinuses. Small  right mastoid effusion. Other: None. IMPRESSION: 1. No acute intracranial abnormality or mass. 2. Mild chronic small vessel ischemic disease. Electronically Signed   By: AlLogan Bores.D.   On: 02/21/2022 19:21   CT ANGIO HEAD NECK W WO CM  Result Date: 02/10/2022 CLINICAL DATA:  [age]ear old male  with persistent headache, hypertension. EXAM: CT ANGIOGRAPHY HEAD AND NECK TECHNIQUE: Multidetector CT imaging of the head and neck was performed using the standard protocol during bolus administration of intravenous contrast. Multiplanar CT image reconstructions and MIPs were obtained to evaluate the vascular anatomy. Carotid stenosis measurements (when applicable) are obtained utilizing NASCET criteria, using the distal internal carotid diameter as the denominator. RADIATION DOSE REDUCTION: This exam was performed according to the departmental dose-optimization program which includes automated exposure control, adjustment of the mA and/or kV according to patient size and/or use of iterative reconstruction technique. CONTRAST:  15m OMNIPAQUE IOHEXOL 300 MG/ML  SOLN COMPARISON:  Plain head CT 0935 hours today. FINDINGS: CTA NECK Skeleton: Age indeterminate but probably chronic mild compression fractures in the visible spine at C7, T2. Some underlying osteopenia. No acute or suspicious osseous lesion identified. Upper chest: Centrilobular emphysema. No superior mediastinal lymphadenopathy. Other neck: Negative. Aortic arch: Extensive Calcified aortic atherosclerosis. Three vessel arch configuration. Right carotid system: Soft and calcified brachiocephalic artery plaque without stenosis. Mild plaque at the right CCA origin. Mild to moderate plaque continues to the bifurcation. No significant right CCA stenosis. Evidence of previous right carotid endarterectomy. Somewhat ectatic right ICA is patent to the skull base without stenosis. Left carotid system: Left CCA soft and calcified plaque throughout its course. No  hemodynamically significant stenosis to the bifurcation. Moderate soft and calcified plaque throughout the left ICA origin and bulb. Prominent soft plaque just below the skull base (series 10, image 156. Less than 50 % stenosis with respect to the distal vessel. Vertebral arteries: Proximal right subclavian artery plaque without stenosis. Patent right vertebral artery origin with up to moderate origin and V1 segment irregularity and stenosis (series 8, image 116). Intermittent right vertebral V2 segment plaque with up to moderate similar stenosis. The right vertebral remains patent to the skull base. Proximal left subclavian artery plaque with no significant stenosis. Left vertebral artery origin is patent with only mild stenosis. Left vertebral artery is somewhat dominant and patent to the skull base with no significant stenosis. CTA HEAD Posterior circulation: Dominant left V4 segment. The distal right vertebral artery is atherosclerotic and diminutive beyond the right PICA origin. Left PICA origin is patent. No significant distal left vertebral stenosis. Patent vertebrobasilar junction. Patent basilar artery with mild irregularity, no significant stenosis. Bilateral SCA and left PCA origins are patent without stenosis. Fetal type right PCA origin with mild right posterior communicating artery irregularity. Bilateral PCA branches are within normal limits. Anterior circulation: Both ICA siphons are patent. There is extensive supraclinoid left siphon calcified plaque. Up to moderate associated left ICA supraclinoid stenosis. Mild to moderate right siphon calcified plaque. Mild right supraclinoid stenosis. Normal right posterior communicating artery origin. Patent carotid termini, MCA and ACA origins. Anterior communicating artery and bilateral ACAs are within normal limits. Left MCA M1 segment is patent with mild irregularity. Left MCA bifurcation is patent without stenosis. Left MCA branches are within normal  limits. Right MCA M1 is patent and bifurcates early with only mild irregularity. Right MCA branches are within normal limits. Venous sinuses: Patent. Anatomic variants: Dominant left vertebral artery. Fetal type right PCA origin. Review of the MIP images confirms the above findings IMPRESSION: 1. Negative for large vessel occlusion. 2. Extensive atherosclerosis in the neck and at the skull base: Previous Right carotid endarterectomy  with no adverse features. Extensive Left carotid atherosclerosis, but no significant stenosis in the neck. Mild to moderate Left supraclinoid ICA siphon stenosis. Moderate tandem stenoses of  the Proximal Right Vertebral Artery, which functionally terminates in PICA. Dominant Left Vertebral Artery with no significant stenosis. 3. Aortic Atherosclerosis (ICD10-I70.0) and Emphysema (ICD10-J43.9). Electronically Signed   By: Genevie Ann M.D.   On: 02/10/2022 12:17   CT HEAD WO CONTRAST  Result Date: 02/10/2022 CLINICAL DATA:  Headache for the past week. EXAM: CT HEAD WITHOUT CONTRAST TECHNIQUE: Contiguous axial images were obtained from the base of the skull through the vertex without intravenous contrast. RADIATION DOSE REDUCTION: This exam was performed according to the departmental dose-optimization program which includes automated exposure control, adjustment of the mA and/or kV according to patient size and/or use of iterative reconstruction technique. COMPARISON:  CT head dated September 11, 2021. FINDINGS: Brain: No evidence of acute infarction, hemorrhage, hydrocephalus, extra-axial collection or mass lesion/mass effect. Old lacunar infarcts in the left basal ganglia again noted. Stable atrophy. Vascular: Atherosclerotic vascular calcification of the carotid siphons. No hyperdense vessel. Skull: Normal. Negative for fracture or focal lesion. Sinuses/Orbits: No acute finding. Other: None. IMPRESSION: 1. No acute intracranial abnormality. Electronically Signed   By: Titus Dubin M.D.    On: 02/10/2022 10:08     Assessment & Plan:   Jacqueline was seen today for cough.  Diagnoses and all orders for this visit:  Diabetes mellitus without complication ([place]) -     Hepatic function panel; Future -     Hemoglobin A1c; Future  Bradycardia -     TSH; Future  Persistent atrial fibrillation ([place])- He has good rate control. -     TSH; Future  Essential hypertension- His blood pressure is well controlled. -     TSH; Future -     Hepatic function panel; Future  Middle insomnia  Subacute cough- Chest x-ray is negative for mass or infiltrate. -     DG Chest 2 View; Future  Low energy -     cyanocobalamin (VITAMIN B12) injection 1,000 mcg   I have discontinued Princess Bruins. Taddeo "Bill"'s hydrochlorothiazide. I am also having him maintain his Hypromellose (ARTIFICIAL TEARS OP), triamcinolone cream, fexofenadine, acetaminophen, Vitamin D-3, Wheat Dextrin (BENEFIBER DRINK MIX PO), hydrocortisone, dofetilide, levocetirizine, Spiriva Respimat, ipratropium, fluticasone, ACCRUFeR, sacubitril-valsartan, nitroGLYCERIN, spironolactone, gabapentin, apixaban, carvedilol, carvedilol, Quviviq, rosuvastatin, dicyclomine, and hydrALAZINE. We administered cyanocobalamin.  Meds ordered this encounter  Medications   cyanocobalamin (VITAMIN B12) injection 1,000 mcg     Follow-up: Return in about 4 weeks (around 04/16/2022).  [name], MD

## 2022-03-19 NOTE — Patient Instructions (Signed)

## 2022-03-23 ENCOUNTER — Other Ambulatory Visit (HOSPITAL_COMMUNITY): Payer: Self-pay | Admitting: Internal Medicine

## 2022-03-24 ENCOUNTER — Other Ambulatory Visit: Payer: Self-pay | Admitting: Internal Medicine

## 2022-03-24 DIAGNOSIS — I2510 Atherosclerotic heart disease of native coronary artery without angina pectoris: Secondary | ICD-10-CM

## 2022-03-24 DIAGNOSIS — I4819 Other persistent atrial fibrillation: Secondary | ICD-10-CM

## 2022-03-24 DIAGNOSIS — I519 Heart disease, unspecified: Secondary | ICD-10-CM

## 2022-03-24 DIAGNOSIS — I10 Essential (primary) hypertension: Secondary | ICD-10-CM

## 2022-03-29 ENCOUNTER — Telehealth: Payer: HMO

## 2022-04-09 ENCOUNTER — Encounter: Payer: Self-pay | Admitting: Cardiovascular Disease

## 2022-04-12 NOTE — Telephone Encounter (Signed)
Called patient, LVM to call back to discuss.  Left call back number.   

## 2022-04-13 NOTE — Telephone Encounter (Signed)
Pt brought [employer] pt assistance forms to office today and given to me. Provided pt with Dillon Young pt assistance forms that he will fill and out and bring back in the next few day. All questions answered and pt verbalizes understanding.

## 2022-04-16 NOTE — Progress Notes (Incomplete)
Date:  04/17/2022   ID:  Dillon Young, DOB September 15, 1932, MRN [id number]   Provider location: [place] [facility] Type of Visit: Established patient   PCP:  Dillon Lima, MD  Cardiologist:  Dillon Burow, MD HF Cardiology: Dr. Aundra Young   HPI: Dillon Young is a 88 y.o. male who has a h/o coronary artery disease, chronic systolic CHF, and persistent atrial fibrillation, he was initially referred by Dr. Gwenlyn Young for evaluation of CHF.  He is status post angioplasty in 1994 and 1995 of his circumflex coronary artery. He had right carotid endarterectomy performed by Dr. Curt Jews June 2013.  Given angina, he had cath in 8/17.  This showed severe RCA stenosis treated with DES. He also had a very small ramus branch that was 90% stenosed and not suitable for intervention as well as a 70% mid LAD lesion although he had no evidence of anterior ischemia on his stress test.   He was diagnosed in 5/19 with new onset atrial fibrillation. Echo was done in 5/19 and showed EF decreased to 30-35%, normal EF by echo in August 2017. He had a DCCV in 5/19, unfortunately, he only held NSR for a few days and was been back in atrial fibrillation soon after that.  He had recurrent chest pain and was again cathed in 01/04/18, this time showing 80% proximal/mid LAD that was treated with DES.  In 10/19, he was admitted for Tikosyn load and was cardioverted back to NSR.    Echo in 1/20 showed  EF up to 55-60% with mild LVH.    11/21 Zio patch showed 22% PVCs with short NSVT runs.  Echo was done in 12/21, showing EF 50-55%, mild LVH, mild MR, normal RV.  Given frequent PVCs, patient was sent to see EP (Dr Dillon Young).  He did not think that the patient was an ablation candidate.   He has been diagnosed with bladder cancer and has had occasional hematuria.   Echo 6/23 showed EF 55-60%, grade I DD, mild LVH, normal RV  Today he returns for HF follow up. Overall feeling fine. He walks 4x/week for 30  minutes at time with wife, with no dyspnea. Has occasional dizziness in the AM, no recent falls. He has occasional hematuria due to his bladder cancer. He is followed by [facility]. Denies palpitations, CP, dizziness, edema, or PND/Orthopnea. Appetite ok. No fever or chills. Weight at home 172-175 pounds. Taking all medications, has been taking spiro 25 daily. BP at home 115-125/50-60s. Recovering from 2 month bout of bronchitis.  ECG (personally reviewed): NSR with PVCs,  QTc 444 msec   Labs (5/19): LDL 50 Labs (8/19): K 4.1, creatinine 1.14 Labs (10/19): K 4.6, creatinine 1.17 Labs (11/19): K 4.5, creatinine 1.08 Labs (1/20): LDL 48 Labs (2/20): K 4.4, creatinine 1.18 Labs (2/21): K 4.2, creatinine 1.14, LDL 35 Labs (1/22): K 4, creatinine 1.09, hgb 13.5, LDL 53 Labs (2/22): K 4.5, creatinine 1.17 Labs (4/23): K 4.4, creatinine 1.05 Labs (9/23): K 3.9, creatinine 0.87   PMH: 1. HTN 2. Hyperlipidemia 3. Carotid artery disease: Right CEA in 6/13.  - Carotid dopplers (5/20): 1-39% BICA stenosis, left subclavian stenosis.  - Carotid dopplers (5/21): s/p right CEA, 72-53% LICA stenosis.  - Carotid dopplers (5/23): s/p right CEA, mild disease on left 4. CAD: - PTCA to LCx in 1994, 1995.  - Angina 8/17 => DES to RCA, also noted to have 70% mLAD, 90% stenosis small ramus.  -  LHC 12/26/17 with 80% ostial-proximal LAD, EF < 25% on LV-gram.  DES to LAD.  5. Chronic systolic CHF: Ischemic cardiomyopathy.   - Echo (8/17) with normal EF.  - Echo (5/19) with EF 30-35%, severe LV dilation, regional wall motion abnormalities.  - Echo (1/20): EF 55-60%, mild LVH, normal RV size and systolic function.  - Echo (12/21): EF 50-55%, mild LVH, normal RV size and systolic function, moderate LAE, mild MR, IVC normal.  - Echo (6/23): EF 55-60%, grade I DD, mild LVH, normal RV 6. Atrial fibrillation: Diagnosed 5/19.   - DCCV 5/19 only held for about 2 days.  - Tikosyn loaded in 10/19 and DCCV to NSR.   7. Bladder cancer 8. PVCs: Zio patch (11/21) with 22% PVCs, short NSVT runs.   Current Outpatient Medications  Medication Sig Dispense Refill   acetaminophen (TYLENOL) 500 MG tablet Take 500 mg by mouth every 6 (six) hours as needed for mild pain or headache.     apixaban (ELIQUIS) 5 MG TABS tablet Take 1 tablet (5 mg total) by mouth 2 (two) times daily. 180 tablet 3   carvedilol (COREG) 3.125 MG tablet Take 1 tablet (3.125 mg total) by mouth 2 (two) times daily with a meal. 180 tablet 0   carvedilol (COREG) 6.25 MG tablet TAKE 1 & 1/2 BY MOUTH 2 TIMES DAILY WITH A MEAL. 270 tablet 1   Cholecalciferol (VITAMIN D-3) 25 MCG (1000 UT) CAPS Take 1,000 Units by mouth daily.     Daridorexant HCl (QUVIVIQ) 25 MG TABS Take 1 tablet by mouth at bedtime. 90 tablet 1   dicyclomine (BENTYL) 10 MG capsule TAKE 1 CAPSULE (10 MG TOTAL) BY MOUTH EVERY 6 (SIX) HOURS AS NEEDED (ABDOMINAL PAIN). 30 capsule 1   dofetilide (TIKOSYN) 125 MCG capsule TAKE 1 CAPSULE BY MOUTH TWICE DAILY 180 capsule 3   Ferric Maltol (ACCRUFER) 30 MG CAPS Take 1 capsule by mouth in the morning and at bedtime. 180 capsule 0   fexofenadine (ALLEGRA) 180 MG tablet Take 180 mg by mouth daily as needed for allergies or rhinitis.      fluticasone (FLONASE) 50 MCG/ACT nasal spray Place 1 spray into both nostrils daily.     gabapentin (NEURONTIN) 100 MG capsule Take 1 capsule (100 mg total) by mouth 3 (three) times daily. 270 capsule 1   hydrALAZINE (APRESOLINE) 25 MG tablet Take 25 mg by mouth 3 (three) times daily. As need when BP is 160     hydrocortisone (ANUSOL-HC) 2.5 % rectal cream Place 1 application rectally 3 (three) times daily as needed for hemorrhoids. 30 g 1   Hypromellose (ARTIFICIAL TEARS OP) Place 1 drop into both eyes 4 (four) times daily as needed (dry eyes).     ipratropium (ATROVENT) 0.06 % nasal spray Place 2 sprays into both nostrils 4 (four) times daily as needed for rhinitis. 90 mL 1   levocetirizine (XYZAL) 5 MG  tablet Take 1 tablet (5 mg total) by mouth every evening. 90 tablet 1   nitroGLYCERIN (NITROSTAT) 0.4 MG SL tablet Place 1 tablet (0.4 mg total) under the tongue every 5 (five) minutes as needed for chest pain. 25 tablet 3   rosuvastatin (CRESTOR) 10 MG tablet TAKE 1 TABLET BY MOUTH EVERY DAY 90 tablet 1   sacubitril-valsartan (ENTRESTO) 97-103 MG Take 1 tablet by mouth 2 (two) times daily. 180 tablet 2   spironolactone (ALDACTONE) 25 MG tablet TAKE 2 TABLETS BY MOUTH EVERY DAY 180 tablet 3   Tiotropium Bromide  Monohydrate (SPIRIVA RESPIMAT) 2.5 MCG/ACT AERS Inhale 2 puffs into the lungs daily. 4 g 11   triamcinolone cream (KENALOG) 0.1 % Apply 1 application topically daily as needed (to affected areas, for itching).      Wheat Dextrin (BENEFIBER DRINK MIX PO) Take 2 Scoops by mouth daily in the afternoon. Take 2 tablespoons daily     No current facility-administered medications for this encounter.   Allergies:   Lipitor [atorvastatin] and Other   Social History:  The patient  reports that he quit smoking about 13 years ago. His smoking use included cigarettes. He has a 90.00 pack-year smoking history. He has never used smokeless tobacco. He reports that he does not currently use alcohol. He reports that he does not use drugs.   Family History:  The patient's family history includes Asthma in his paternal grandmother; Heart disease in his father; Hypertension in his mother.   ROS:  Please see the history of present illness.   All other systems are personally reviewed and negative.   Exam:   BP 130/80   Pulse 61   Wt 80.6 kg (177 lb 9.6 oz)   SpO2 97%   BMI 26.23 kg/m  General:  NAD. No resp difficulty, walked into clinic, appears younger than stated age. HEENT: hoarse Neck: Supple. No JVD. Carotids 2+ bilat; no bruits. No lymphadenopathy or thryomegaly appreciated. Cor: PMI nondisplaced. Irregular rate (PVCs) & rhythm. No rubs, gallops or murmurs. Lungs: Clear Abdomen: Soft, nontender,  nondistended. No hepatosplenomegaly. No bruits or masses. Good bowel sounds. Extremities: No cyanosis, clubbing, rash, edema Neuro: Alert & oriented x 3, cranial nerves grossly intact. Moves all 4 extremities w/o difficulty. Affect pleasant.   Recent Labs: 10/18/2021: Magnesium 2.4 02/10/2022: BUN 18; Creatinine, Ser 0.87; Hemoglobin 14.3; Platelets 133; Potassium 3.9; Sodium 142  Personally reviewed   Wt Readings from Last 3 Encounters:  04/17/22 80.6 kg (177 lb 9.6 oz)  03/19/22 78.7 kg (173 lb 8 oz)  02/10/22 80.3 kg (177 lb)    ASSESSMENT AND PLAN: 1. Chronic systolic CHF: Suspect mixed ischemic/nonischemic cardiomyopathy.  Echo in 5/19 with EF 30-35% and severe LV dilation.  CAD likely plays role, but fall in EF was noted around the time he went into atrial fibrillation.  No RVR, but can see fall in EF with afib even in the absence of RVR. He has been in NSR on Tikosyn, and echo from 1/20 showed EF up to 55-60%.  I was concerned that there would be a fall in his EF due to frequent PVCs, but echo in 12/21 showed EF 50-55%. Echo 6/23 EF 55-60%. On exam, he is euvolemic.  NYHA class I-II symptoms. - Continue Coreg 3.125  mg bid with frequent PVCs, HR 61 so will not increase.  - Continue Entresto 97/103 mg bid.    - Continue spironolactone 25 mg daily. BMET and BNP today.  2. CAD: Most recently, DES to proximal/mid LAD on 12/26/17.  He was on Plavix for > 6 months then stopped it with gross hematuria.  He has had frequent PVCs, but LV function remains preserved and he has had no chest pain.  - Continue apixaban 5 mg bid.  Recent hgb stable at 14.3 - No ASA with apixaban use.  - Continue Crestor. LDL 60 on 5/23. 3. Atrial fibrillation: Paroxysmal.  DCCV in 5/19 only held a few days.  Tikosyn loaded in 10/19 with DCCV, now in NSR.   QTc ok on ECG today.  - Continue Eliquis 5  mg bid.  - Continue Tikosyn, BMET and Mg today.  4. HTN: BP controlled on current regimen. 5. Carotid stenosis: Stable  carotid dopplers in 5/23.  6. PVCs: 22% PVCs with NSVT runs on Zio patch in 11/21.  This is despite being on Tikosyn and Coreg.  He has not had chest pain and EF remains preserved.  He saw Dr. Rayann Young who recommended medical management rather than attempting ablation.  - Continue Coreg, will not increase with HR 61 today. - Continue Tikosyn.  7. Bladder Cancer: Follows with Dr. [name] with [facility]. 8. Hoarseness: on-going x 2 months. Denies GERD symptoms. We discussed possible using PPI. May be sequelae from recent bronchitis and cough, but it is bothering him. Also has significant smoking history. No dysphagia. - Refer to ENT, per patient request. 9. Otalgia: Right ear pain for some time. Was told in the past he needed surgery on ear, but he is unable to clarify. No fevers or drainage. - As above, refer to ENT.  Follow up in 6 months with Dr. Aundra Young. He has follow up labs with PCP arranged.  Signed, Rafael Bihari, FNP  04/17/2022  [facility] [address] [facility] [place] [address] [phone] (office) [phone] (fax)

## 2022-04-17 ENCOUNTER — Ambulatory Visit (HOSPITAL_COMMUNITY)
Admission: RE | Admit: 2022-04-17 | Discharge: 2022-04-17 | Disposition: A | Discharge status: Home / Self Care | Payer: HMO | Source: Ambulatory Visit | Attending: Family Medicine | Admitting: Family Medicine

## 2022-04-17 ENCOUNTER — Encounter (HOSPITAL_COMMUNITY): Payer: Self-pay

## 2022-04-17 VITALS — BP 130/80 | HR 61 | Wt 177.6 lb

## 2022-04-17 DIAGNOSIS — I10 Essential (primary) hypertension: Secondary | ICD-10-CM

## 2022-04-17 DIAGNOSIS — I5022 Chronic systolic (congestive) heart failure: Secondary | ICD-10-CM | POA: Insufficient documentation

## 2022-04-17 DIAGNOSIS — Z87891 Personal history of nicotine dependence: Secondary | ICD-10-CM | POA: Insufficient documentation

## 2022-04-17 DIAGNOSIS — Z7901 Long term (current) use of anticoagulants: Secondary | ICD-10-CM | POA: Diagnosis not present

## 2022-04-17 DIAGNOSIS — I4819 Other persistent atrial fibrillation: Secondary | ICD-10-CM | POA: Diagnosis not present

## 2022-04-17 DIAGNOSIS — I2510 Atherosclerotic heart disease of native coronary artery without angina pectoris: Secondary | ICD-10-CM | POA: Diagnosis not present

## 2022-04-17 DIAGNOSIS — H9201 Otalgia, right ear: Secondary | ICD-10-CM | POA: Insufficient documentation

## 2022-04-17 DIAGNOSIS — I255 Ischemic cardiomyopathy: Secondary | ICD-10-CM | POA: Diagnosis not present

## 2022-04-17 DIAGNOSIS — Z955 Presence of coronary angioplasty implant and graft: Secondary | ICD-10-CM | POA: Diagnosis not present

## 2022-04-17 DIAGNOSIS — C679 Malignant neoplasm of bladder, unspecified: Secondary | ICD-10-CM

## 2022-04-17 DIAGNOSIS — R490 Dysphonia: Secondary | ICD-10-CM

## 2022-04-17 DIAGNOSIS — Z8551 Personal history of malignant neoplasm of bladder: Secondary | ICD-10-CM | POA: Insufficient documentation

## 2022-04-17 DIAGNOSIS — I472 Ventricular tachycardia, unspecified: Secondary | ICD-10-CM | POA: Insufficient documentation

## 2022-04-17 DIAGNOSIS — I110 Hypertensive heart disease with heart failure: Secondary | ICD-10-CM | POA: Insufficient documentation

## 2022-04-17 DIAGNOSIS — I493 Ventricular premature depolarization: Secondary | ICD-10-CM | POA: Insufficient documentation

## 2022-04-17 DIAGNOSIS — E785 Hyperlipidemia, unspecified: Secondary | ICD-10-CM | POA: Diagnosis not present

## 2022-04-17 DIAGNOSIS — R310 Gross hematuria: Secondary | ICD-10-CM | POA: Diagnosis not present

## 2022-04-17 DIAGNOSIS — I6523 Occlusion and stenosis of bilateral carotid arteries: Secondary | ICD-10-CM

## 2022-04-17 DIAGNOSIS — Z79899 Other long term (current) drug therapy: Secondary | ICD-10-CM | POA: Insufficient documentation

## 2022-04-17 LAB — MAGNESIUM: Magnesium: 2.1 mg/dL (ref 1.7–2.4)

## 2022-04-17 LAB — BASIC METABOLIC PANEL
Anion gap: 9 (ref 5–15)
BUN: 16 mg/dL (ref 8–23)
CO2: 25 mmol/L (ref 22–32)
Calcium: 9.1 mg/dL (ref 8.9–10.3)
Chloride: 106 mmol/L (ref 98–111)
Creatinine, Ser: 0.97 mg/dL (ref 0.61–1.24)
GFR, Estimated: >60 mL/min (ref >60)
Glucose, Bld: 125 mg/dL — ABNORMAL HIGH (ref 70–99)
Potassium: 5 mmol/L (ref 3.5–5.1)
Sodium: 140 mmol/L (ref 135–145)

## 2022-04-17 LAB — BRAIN NATRIURETIC PEPTIDE: B Natriuretic Peptide: 228.9 pg/mL — ABNORMAL HIGH (ref 0.0–100.0)

## 2022-04-17 NOTE — Patient Instructions (Signed)
It was great to see you today! No medication changes are needed at this time.   Labs today We will only contact you if something comes back abnormal or we need to make some changes. Otherwise no news is good news!  You have been referred to [facility] -they will be in touch with a appointment  Your physician wants you to follow-up in: 6 months with Dr [name]. You will receive a reminder letter in the mail two months in advance. If you don't receive a letter, please call our office to schedule the follow-up appointment.  Do the following things EVERYDAY: Weigh yourself in the morning before breakfast. Write it down and keep it in a log. Take your medicines as prescribed Eat low salt foods--Limit salt (sodium) to 2000 mg per day.  Stay as active as you can everyday Limit all fluids for the day to less than 2 liters  At the [facility], you and your health needs are our priority. As part of our continuing mission to provide you with exceptional heart care, we have created designated Provider Care Teams. These Care Teams include your primary Cardiologist (physician) and Advanced Practice Providers (APPs- Physician Assistants and Nurse Practitioners) who all work together to provide you with the care you need, when you need it.   You may see any of the following providers on your designated Care Team at your next follow up: Dr Glori Bickers Dr [name] Dr. Roxana Hires, NP Lyda Jester, [place] [facility] [place], [place] Forestine Na, NP Audry Riles, PharmD   Please be sure to bring in all your medications bottles to every appointment.   If you have any questions or concerns before your next appointment please send [place] a message through [place] or call our office at [phone].    TO LEAVE A MESSAGE FOR THE NURSE SELECT OPTION 2, PLEASE LEAVE A MESSAGE INCLUDING: YOUR NAME DATE OF BIRTH CALL BACK NUMBER REASON FOR CALL**this is  important as we prioritize the call backs  YOU WILL RECEIVE A CALL BACK THE SAME DAY AS LONG AS YOU CALL BEFORE 4:00 PM

## 2022-04-21 ENCOUNTER — Other Ambulatory Visit: Payer: Self-pay | Admitting: Internal Medicine

## 2022-04-21 DIAGNOSIS — I519 Heart disease, unspecified: Secondary | ICD-10-CM

## 2022-04-21 DIAGNOSIS — I4819 Other persistent atrial fibrillation: Secondary | ICD-10-CM

## 2022-04-21 DIAGNOSIS — I10 Essential (primary) hypertension: Secondary | ICD-10-CM

## 2022-04-21 DIAGNOSIS — I2510 Atherosclerotic heart disease of native coronary artery without angina pectoris: Secondary | ICD-10-CM

## 2022-05-14 ENCOUNTER — Other Ambulatory Visit: Payer: Self-pay

## 2022-05-14 MED ORDER — SACUBITRIL-VALSARTAN 97-103 MG PO TABS: 1.0000 | ORAL_TABLET | Freq: Two times a day (BID) | ORAL | 3 refills | Status: DC

## 2022-05-14 NOTE — Telephone Encounter (Signed)
[employer] pt assistance completed, signed and faxed. Confirmation of successful fax received.

## 2022-05-25 ENCOUNTER — Other Ambulatory Visit: Payer: Self-pay | Admitting: Nurse Practitioner

## 2022-05-25 ENCOUNTER — Other Ambulatory Visit (INDEPENDENT_AMBULATORY_CARE_PROVIDER_SITE_OTHER): Payer: HMO

## 2022-05-25 ENCOUNTER — Ambulatory Visit (INDEPENDENT_AMBULATORY_CARE_PROVIDER_SITE_OTHER): Payer: HMO | Admitting: Nurse Practitioner

## 2022-05-25 VITALS — HR 88 | Temp 97.9°F | Wt 180.2 lb

## 2022-05-25 DIAGNOSIS — U071 COVID-19: Secondary | ICD-10-CM

## 2022-05-25 LAB — BASIC METABOLIC PANEL
BUN: 17 mg/dL (ref 6–23)
CO2: 29 mEq/L (ref 19–32)
Calcium: 9 mg/dL (ref 8.4–10.5)
Chloride: 98 mEq/L (ref 96–112)
Creatinine, Ser: 1.19 mg/dL (ref 0.40–1.50)
GFR: 54.8 mL/min — ABNORMAL LOW (ref >60.00)
Glucose, Bld: 92 mg/dL (ref 70–99)
Potassium: 3.7 mEq/L (ref 3.5–5.1)
Sodium: 134 mEq/L — ABNORMAL LOW (ref 135–145)

## 2022-05-25 LAB — POC COVID19 BINAXNOW: SARS Coronavirus 2 Ag: POSITIVE — AB

## 2022-05-25 MED ORDER — BENZONATATE 100 MG PO CAPS: 100.0000 mg | ORAL_CAPSULE | Freq: Two times a day (BID) | ORAL | 0 refills | Status: DC | PRN

## 2022-05-25 MED ORDER — MOLNUPIRAVIR EUA 200MG CAPSULE: 4.0000 | ORAL_CAPSULE | Freq: Two times a day (BID) | ORAL | 0 refills | Status: AC

## 2022-05-25 NOTE — Assessment & Plan Note (Addendum)
Acute, will order stat BMP to verify GFR.  Plan on treating with course of Paxlovid based on lab results.  Recommend patient reduce dose of Eliquis to 2.5 mg twice a day and hold rosuvastatin while taking Paxlovid.  Return to normal dosing once Paxlovid is completed.  Prescribed Tessalon Perles patient can take as needed for cough suppression as well.  If symptoms worsen or if patient notes signs of excessive bleeding he was told to proceed to the emergency department.  Patient reports understanding.

## 2022-05-25 NOTE — Progress Notes (Addendum)
   Established Patient Office Visit  Subjective   Patient ID: Dillon Young, male    DOB: 22-Jun-1932  Age: 88 y.o. MRN: [id number]  Chief Complaint  Patient presents with   Covid Positive    Symptom onset 4-5 scale.  Experiencing cough, shortness of breath, diarrhea, congestion.  Fever seems to be better but did spike last night.  Took at home COVID test and it was found to be positive.    Review of Systems  Constitutional:  Positive for fever.  HENT:  Positive for congestion.   Respiratory:  Positive for cough, sputum production (clear) and shortness of breath.   Gastrointestinal:  Positive for diarrhea.      Objective:     Pulse 88   Temp 97.9 F (36.6 C) (Oral)   Wt 180 lb 4 oz (81.8 kg)   SpO2 93%   BMI 26.62 kg/m    Physical Exam Vitals reviewed.  Constitutional:      Appearance: Normal appearance.  HENT:     Head: Normocephalic and atraumatic.  Cardiovascular:     Rate and Rhythm: Normal rate and regular rhythm.  Pulmonary:     Effort: Pulmonary effort is normal.     Breath sounds: Normal breath sounds.  Musculoskeletal:     Cervical back: Neck supple.  Skin:    General: Skin is warm and dry.  Neurological:     Mental Status: He is alert and oriented to person, place, and time.  Psychiatric:        Mood and Affect: Mood normal.        Behavior: Behavior normal.        Thought Content: Thought content normal.        Judgment: Judgment normal.      No results found for any visits on 05/25/22.    The ASCVD Risk score (Arnett DK, et al., 2019) failed to calculate for the following reasons:   The 2019 ASCVD risk score is only valid for ages 32 to 54    Assessment & Plan:   Problem List Items Addressed This Visit       Other   COVID-19 - Primary    Acute, will order stat BMP to verify GFR.  Plan on treating with course of Paxlovid based on lab results.  Recommend patient reduce dose of Eliquis to 2.5 mg twice a day and hold rosuvastatin  while taking Paxlovid.  Return to normal dosing once Paxlovid is completed.  Prescribed Tessalon Perles patient can take as needed for cough suppression as well.  If symptoms worsen or if patient notes signs of excessive bleeding he was told to proceed to the emergency department.  Patient reports understanding.      Relevant Medications   benzonatate (TESSALON) 100 MG capsule   Other Relevant Orders   Basic metabolic panel    Return if symptoms worsen or fail to improve.    Ailene Ards, NP

## 2022-05-25 NOTE — Progress Notes (Signed)
Please call patient and let him know that I got his lab results and decided to prescribe molnupiravir for him instead of paxlovid due to the fact that paxlovid interacts with multiple medications and the molnupiravir does not interact. He can take all of his normal medications as prescribed while taking the molnupiravir. Please let me know if he has any questions.

## 2022-05-25 NOTE — Addendum Note (Signed)
Addended by: Ferol Luz, Muhannad Bignell P on: 05/25/2022 04:53 PM   Modules accepted: Orders

## 2022-05-25 NOTE — Progress Notes (Signed)
Made pt aware of medication send to pharmacy to pick up, pt showed understanding

## 2022-05-25 NOTE — Patient Instructions (Addendum)
Cut eliquis in half for a dose of 2.'5mg'$  by mouth twice a day while on paxlovid then return to your normal dose.  Hold/Stop rosuvastatin while on paxlovid you can restart it one you finish paxlovid.  Watch for signs of bleeding (blood in stool, black tarry stool, blood in vomit) if this occurs call 911 If symptoms worsen especially if you feel short of breath call 911.

## 2022-05-29 ENCOUNTER — Telehealth: Payer: Self-pay

## 2022-05-29 ENCOUNTER — Other Ambulatory Visit: Payer: Self-pay

## 2022-05-29 MED ORDER — APIXABAN 5 MG PO TABS: 5.0000 mg | ORAL_TABLET | Freq: Two times a day (BID) | ORAL | 3 refills | Status: DC

## 2022-05-29 NOTE — Telephone Encounter (Signed)
[employer] fax received. Pt is covered for the year of 2024.

## 2022-05-29 NOTE — Telephone Encounter (Signed)
[employer] pt assistance completed, signed and faxed. Confirmation of successful fax received.  Let pt know about this as well as approval from [employer].

## 2022-05-29 NOTE — Telephone Encounter (Signed)
Late entry from 05/14/22:  [employer] pt assistance received. Application filled out, signed and faxed with confirmation of successful fax.

## 2022-05-30 ENCOUNTER — Encounter: Payer: Self-pay | Admitting: Internal Medicine

## 2022-05-31 ENCOUNTER — Ambulatory Visit: Payer: HMO | Admitting: Internal Medicine

## 2022-05-31 ENCOUNTER — Encounter: Payer: Self-pay | Admitting: Cardiovascular Disease

## 2022-05-31 NOTE — Telephone Encounter (Signed)
Called and spoke with patient regarding his [employer] application- patient reports that he received a letter that said that he was missing information but that letter was mailed on 12/21- patient was told on 1/2 that he was approved for the year 2024- advised patient that this was likely an old message.  Will forward to Dr. Kennon Holter nurse for review. Patient aware and verbalized understanding.

## 2022-06-01 ENCOUNTER — Ambulatory Visit (INDEPENDENT_AMBULATORY_CARE_PROVIDER_SITE_OTHER): Payer: HMO | Admitting: Internal Medicine

## 2022-06-01 ENCOUNTER — Encounter: Payer: Self-pay | Admitting: Internal Medicine

## 2022-06-01 VITALS — BP 120/60 | HR 74 | Temp 97.9°F | Ht 69.0 in | Wt 179.0 lb

## 2022-06-01 DIAGNOSIS — U071 COVID-19: Secondary | ICD-10-CM | POA: Diagnosis not present

## 2022-06-01 MED ORDER — PROMETHAZINE-DM 6.25-15 MG/5ML PO SYRP: 5.0000 mL | ORAL_SOLUTION | Freq: Four times a day (QID) | ORAL | 0 refills | Status: DC | PRN

## 2022-06-01 MED ORDER — PREDNISONE 20 MG PO TABS: 40.0000 mg | ORAL_TABLET | Freq: Every day | ORAL | 0 refills | Status: AC

## 2022-06-01 NOTE — Assessment & Plan Note (Signed)
With post-viral cough and congestion. Rx promethazine/dm cough syrup and prednisone 4 day course to help stop drainage.

## 2022-06-01 NOTE — Patient Instructions (Signed)
We have sent in the strong cough medicine to use at night time.  We have also sent in prednisone to take 2 pills daily for 4 days to help you get better quicker.   It could be another few weeks until the cough is mostly gone.

## 2022-06-01 NOTE — Progress Notes (Signed)
   Subjective:   Patient ID: Dillon Young, male    DOB: Aug 02, 1932, 89 y.o.   MRN: [id number]  HPI The patient is an 89 YO man coming in for sick visit. Covid-19 positive 05/25/22 and not feeling improved. Took paxlovid.   Review of Systems  Constitutional:  Positive for activity change. Negative for appetite change, chills, fatigue, fever and unexpected weight change.  HENT:  Positive for congestion, postnasal drip, rhinorrhea and sinus pressure. Negative for ear discharge, ear pain, sinus pain, sneezing, sore throat, tinnitus, trouble swallowing and voice change.   Eyes: Negative.   Respiratory:  Positive for cough. Negative for chest tightness, shortness of breath and wheezing.   Cardiovascular: Negative.   Gastrointestinal: Negative.   Musculoskeletal:  Negative for myalgias.  Neurological: Negative.     Objective:  Physical Exam Constitutional:      Appearance: He is well-developed.  HENT:     Head: Normocephalic and atraumatic.  Cardiovascular:     Rate and Rhythm: Normal rate and regular rhythm.  Pulmonary:     Effort: Pulmonary effort is normal. No respiratory distress.     Breath sounds: Normal breath sounds. No wheezing or rales.  Abdominal:     General: Bowel sounds are normal. There is no distension.     Palpations: Abdomen is soft.     Tenderness: There is no abdominal tenderness. There is no rebound.  Musculoskeletal:     Cervical back: Normal range of motion.  Skin:    General: Skin is warm and dry.  Neurological:     Mental Status: He is alert and oriented to person, place, and time.     Coordination: Coordination normal.     Vitals:   06/01/22 1458  BP: 120/60  Pulse: 74  Temp: 97.9 F (36.6 C)  TempSrc: Oral  SpO2: 99%  Weight: 179 lb (81.2 kg)  Height: '5\' 9"'$  (1.753 m)    Assessment & Plan:

## 2022-06-10 ENCOUNTER — Encounter: Payer: Self-pay | Admitting: Cardiovascular Disease

## 2022-06-27 ENCOUNTER — Encounter: Payer: Self-pay | Admitting: Internal Medicine

## 2022-06-27 ENCOUNTER — Ambulatory Visit (INDEPENDENT_AMBULATORY_CARE_PROVIDER_SITE_OTHER): Payer: PPO | Admitting: Internal Medicine

## 2022-06-27 VITALS — BP 126/82 | HR 80 | Temp 98.2°F | Resp 16 | Ht 69.0 in | Wt 178.0 lb

## 2022-06-27 DIAGNOSIS — I4819 Other persistent atrial fibrillation: Secondary | ICD-10-CM

## 2022-06-27 DIAGNOSIS — E1140 Type 2 diabetes mellitus with diabetic neuropathy, unspecified: Secondary | ICD-10-CM | POA: Diagnosis not present

## 2022-06-27 DIAGNOSIS — I10 Essential (primary) hypertension: Secondary | ICD-10-CM

## 2022-06-27 DIAGNOSIS — N1832 Chronic kidney disease, stage 3b: Secondary | ICD-10-CM | POA: Diagnosis not present

## 2022-06-27 DIAGNOSIS — Z0001 Encounter for general adult medical examination with abnormal findings: Secondary | ICD-10-CM

## 2022-06-27 DIAGNOSIS — D510 Vitamin B12 deficiency anemia due to intrinsic factor deficiency: Secondary | ICD-10-CM

## 2022-06-27 DIAGNOSIS — Z0000 Encounter for general adult medical examination without abnormal findings: Secondary | ICD-10-CM | POA: Diagnosis not present

## 2022-06-27 DIAGNOSIS — E119 Type 2 diabetes mellitus without complications: Secondary | ICD-10-CM

## 2022-06-27 DIAGNOSIS — D508 Other iron deficiency anemias: Secondary | ICD-10-CM | POA: Diagnosis not present

## 2022-06-27 DIAGNOSIS — I493 Ventricular premature depolarization: Secondary | ICD-10-CM

## 2022-06-27 DIAGNOSIS — E785 Hyperlipidemia, unspecified: Secondary | ICD-10-CM | POA: Diagnosis not present

## 2022-06-27 LAB — CBC WITH DIFFERENTIAL/PLATELET
Basophils Absolute: 0 10*3/uL (ref 0.0–0.1)
Basophils Relative: 0.8 % (ref 0.0–3.0)
Eosinophils Absolute: 0.1 10*3/uL (ref 0.0–0.7)
Eosinophils Relative: 1.9 % (ref 0.0–5.0)
HCT: 45.2 % (ref 39.0–52.0)
Hemoglobin: 15.2 g/dL (ref 13.0–17.0)
Lymphocytes Relative: 21.5 % (ref 12.0–46.0)
Lymphs Abs: 1 10*3/uL (ref 0.7–4.0)
MCHC: 33.6 g/dL (ref 30.0–36.0)
MCV: 86.1 fl (ref 78.0–100.0)
Monocytes Absolute: 0.5 10*3/uL (ref 0.1–1.0)
Monocytes Relative: 11.7 % (ref 3.0–12.0)
Neutro Abs: 3 10*3/uL (ref 1.4–7.7)
Neutrophils Relative %: 64.1 % (ref 43.0–77.0)
Platelets: 126 10*3/uL — ABNORMAL LOW (ref 150.0–400.0)
RBC: 5.25 Mil/uL (ref 4.22–5.81)
RDW: 14 % (ref 11.5–15.5)
WBC: 4.6 10*3/uL (ref 4.0–10.5)

## 2022-06-27 LAB — IBC + FERRITIN
Ferritin: 18.9 ng/mL — ABNORMAL LOW (ref 22.0–322.0)
Iron: 88 ug/dL (ref 42–165)
Saturation Ratios: 22.4 % (ref 20.0–50.0)
TIBC: 392 ug/dL (ref 250.0–450.0)
Transferrin: 280 mg/dL (ref 212.0–360.0)

## 2022-06-27 LAB — HEPATIC FUNCTION PANEL
ALT: 9 U/L (ref 0–53)
AST: 14 U/L (ref 0–37)
Albumin: 4.6 g/dL (ref 3.5–5.2)
Alkaline Phosphatase: 49 U/L (ref 39–117)
Bilirubin, Direct: 0.1 mg/dL (ref 0.0–0.3)
Total Bilirubin: 0.7 mg/dL (ref 0.2–1.2)
Total Protein: 7.6 g/dL (ref 6.0–8.3)

## 2022-06-27 LAB — BASIC METABOLIC PANEL
BUN: 20 mg/dL (ref 6–23)
CO2: 29 mEq/L (ref 19–32)
Calcium: 9.7 mg/dL (ref 8.4–10.5)
Chloride: 102 mEq/L (ref 96–112)
Creatinine, Ser: 1.2 mg/dL (ref 0.40–1.50)
GFR: 54.22 mL/min — ABNORMAL LOW (ref >60.00)
Glucose, Bld: 121 mg/dL — ABNORMAL HIGH (ref 70–99)
Potassium: 4.6 mEq/L (ref 3.5–5.1)
Sodium: 140 mEq/L (ref 135–145)

## 2022-06-27 LAB — FOLATE: Folate: 20.6 ng/mL (ref >5.9)

## 2022-06-27 LAB — HEMOGLOBIN A1C: Hgb A1c MFr Bld: 6.5 % (ref 4.6–6.5)

## 2022-06-27 LAB — TSH: TSH: 3.81 u[IU]/mL (ref 0.35–5.50)

## 2022-06-27 MED ORDER — GABAPENTIN 300 MG PO CAPS: 300.0000 mg | ORAL_CAPSULE | Freq: Three times a day (TID) | ORAL | 1 refills | Status: DC

## 2022-06-27 NOTE — Patient Instructions (Signed)
Health Maintenance, Male Adopting a healthy lifestyle and getting preventive care are important in promoting health and wellness. Ask your health care provider about: The right schedule for you to have regular tests and exams. Things you can do on your own to prevent diseases and keep yourself healthy. What should I know about diet, weight, and exercise? Eat a healthy diet  Eat a diet that includes plenty of vegetables, fruits, low-fat dairy products, and lean protein. Do not eat a lot of foods that are high in solid fats, added sugars, or sodium. Maintain a healthy weight Body mass index (BMI) is a measurement that can be used to identify possible weight problems. It estimates body fat based on height and weight. Your health care provider can help determine your BMI and help you achieve or maintain a healthy weight. Get regular exercise Get regular exercise. This is one of the most important things you can do for your health. Most adults should: Exercise for at least 150 minutes each week. The exercise should increase your heart rate and make you sweat (moderate-intensity exercise). Do strengthening exercises at least twice a week. This is in addition to the moderate-intensity exercise. Spend less time sitting. Even light physical activity can be beneficial. Watch cholesterol and blood lipids Have your blood tested for lipids and cholesterol at 87 years of age, then have this test every 5 years. You may need to have your cholesterol levels checked more often if: Your lipid or cholesterol levels are high. You are older than 87 years of age. You are at high risk for heart disease. What should I know about cancer screening? Many types of cancers can be detected early and may often be prevented. Depending on your health history and family history, you may need to have cancer screening at various ages. This may include screening for: Colorectal cancer. Prostate cancer. Skin cancer. Lung  cancer. What should I know about heart disease, diabetes, and high blood pressure? Blood pressure and heart disease High blood pressure causes heart disease and increases the risk of stroke. This is more likely to develop in people who have high blood pressure readings or are overweight. Talk with your health care provider about your target blood pressure readings. Have your blood pressure checked: Every 3-5 years if you are 18-39 years of age. Every year if you are 40 years old or older. If you are between the ages of 65 and 75 and are a current or former smoker, ask your health care provider if you should have a one-time screening for abdominal aortic aneurysm (AAA). Diabetes Have regular diabetes screenings. This checks your fasting blood sugar level. Have the screening done: Once every three years after age 45 if you are at a normal weight and have a low risk for diabetes. More often and at a younger age if you are overweight or have a high risk for diabetes. What should I know about preventing infection? Hepatitis B If you have a higher risk for hepatitis B, you should be screened for this virus. Talk with your health care provider to find out if you are at risk for hepatitis B infection. Hepatitis C Blood testing is recommended for: Everyone born from 1945 through 1965. Anyone with known risk factors for hepatitis C. Sexually transmitted infections (STIs) You should be screened each year for STIs, including gonorrhea and chlamydia, if: You are sexually active and are younger than 87 years of age. You are older than 87 years of age and your   health care provider tells you that you are at risk for this type of infection. Your sexual activity has changed since you were last screened, and you are at increased risk for chlamydia or gonorrhea. Ask your health care provider if you are at risk. Ask your health care provider about whether you are at high risk for HIV. Your health care provider  may recommend a prescription medicine to help prevent HIV infection. If you choose to take medicine to prevent HIV, you should first get tested for HIV. You should then be tested every 3 months for as long as you are taking the medicine. Follow these instructions at home: Alcohol use Do not drink alcohol if your health care provider tells you not to drink. If you drink alcohol: Limit how much you have to 0-2 drinks a day. Know how much alcohol is in your drink. In the [place] one drink equals one 12 oz bottle of beer (355 mL), one 5 oz glass of wine (148 mL), or one 1 oz glass of hard liquor (44 mL). Lifestyle Do not use any products that contain nicotine or tobacco. These products include cigarettes, chewing tobacco, and vaping devices, such as e-cigarettes. If you need help quitting, ask your health care provider. Do not use street drugs. Do not share needles. Ask your health care provider for help if you need support or information about quitting drugs. General instructions Schedule regular health, dental, and eye exams. Stay current with your vaccines. Tell your health care provider if: You often feel depressed. You have ever been abused or do not feel safe at home. Summary Adopting a healthy lifestyle and getting preventive care are important in promoting health and wellness. Follow your health care provider's instructions about healthy diet, exercising, and getting tested or screened for diseases. Follow your health care provider's instructions on monitoring your cholesterol and blood pressure. This information is not intended to replace advice given to you by your health care provider. Make sure you discuss any questions you have with your health care provider. Document Revised: 10/03/2020 Document Reviewed: 10/03/2020 Elsevier Patient Education  2023 [employer]  

## 2022-06-27 NOTE — Progress Notes (Signed)
Subjective:  Patient ID: KHALI ALBANESE, male    DOB: 17-Dec-1932  Age: 89 y.o. MRN: [id number]  CC: Annual Exam, Diabetes, Hyperlipidemia, and Hypertension   HPI BIENVENIDO PROEHL presents for a CPX and f/up ----  He complains of burning pain in both feet and would like to increase the gabapentin dose.  Outpatient Medications Prior to Visit  Medication Sig Dispense Refill   acetaminophen (TYLENOL) 500 MG tablet Take 500 mg by mouth every 6 (six) hours as needed for mild pain or headache.     apixaban (ELIQUIS) 5 MG TABS tablet Take 1 tablet (5 mg total) by mouth 2 (two) times daily. 180 tablet 3   benzonatate (TESSALON) 100 MG capsule Take 1 capsule (100 mg total) by mouth 2 (two) times daily as needed for cough. 20 capsule 0   carvedilol (COREG) 3.125 MG tablet TAKE 1 TABLET BY MOUTH TWICE A DAY WITH A MEAL 180 tablet 0   carvedilol (COREG) 6.25 MG tablet TAKE 1 & 1/2 BY MOUTH 2 TIMES DAILY WITH A MEAL. (Patient taking differently: Take 1 BY MOUTH 2 TIMES DAILY WITH A MEAL) 270 tablet 1   Cholecalciferol (VITAMIN D-3) 25 MCG (1000 UT) CAPS Take 1,000 Units by mouth daily.     Daridorexant HCl (QUVIVIQ) 25 MG TABS Take 1 tablet by mouth at bedtime. 90 tablet 1   dicyclomine (BENTYL) 10 MG capsule TAKE 1 CAPSULE (10 MG TOTAL) BY MOUTH EVERY 6 (SIX) HOURS AS NEEDED (ABDOMINAL PAIN). 30 capsule 1   dofetilide (TIKOSYN) 125 MCG capsule TAKE 1 CAPSULE BY MOUTH TWICE DAILY 180 capsule 3   Ferric Maltol (ACCRUFER) 30 MG CAPS Take 1 capsule by mouth in the morning and at bedtime. 180 capsule 0   fexofenadine (ALLEGRA) 180 MG tablet Take 180 mg by mouth daily as needed for allergies or rhinitis.      fluticasone (FLONASE) 50 MCG/ACT nasal spray Place 1 spray into both nostrils daily.     hydrALAZINE (APRESOLINE) 25 MG tablet Take 25 mg by mouth 3 (three) times daily. As need when BP is 160     hydrocortisone (ANUSOL-HC) 2.5 % rectal cream Place 1 application rectally 3 (three) times daily as  needed for hemorrhoids. 30 g 1   Hypromellose (ARTIFICIAL TEARS OP) Place 1 drop into both eyes 4 (four) times daily as needed (dry eyes).     ipratropium (ATROVENT) 0.06 % nasal spray Place 2 sprays into both nostrils 4 (four) times daily as needed for rhinitis. 90 mL 1   levocetirizine (XYZAL) 5 MG tablet Take 1 tablet (5 mg total) by mouth every evening. 90 tablet 1   nitroGLYCERIN (NITROSTAT) 0.4 MG SL tablet Place 1 tablet (0.4 mg total) under the tongue every 5 (five) minutes as needed for chest pain. 25 tablet 3   promethazine-dextromethorphan (PROMETHAZINE-DM) 6.25-15 MG/5ML syrup Take 5 mLs by mouth 4 (four) times daily as needed. 118 mL 0   rosuvastatin (CRESTOR) 10 MG tablet TAKE 1 TABLET BY MOUTH EVERY DAY 90 tablet 1   sacubitril-valsartan (ENTRESTO) 97-103 MG Take 1 tablet by mouth 2 (two) times daily. 180 tablet 3   spironolactone (ALDACTONE) 25 MG tablet TAKE 2 TABLETS BY MOUTH EVERY DAY (Patient taking differently: Take 25 mg by mouth daily.) 180 tablet 3   Tiotropium Bromide Monohydrate (SPIRIVA RESPIMAT) 2.5 MCG/ACT AERS Inhale 2 puffs into the lungs daily. 4 g 11   triamcinolone cream (KENALOG) 0.1 % Apply 1 application topically daily as needed (to  affected areas, for itching).      Wheat Dextrin (BENEFIBER DRINK MIX PO) Take 2 Scoops by mouth daily in the afternoon. Take 2 tablespoons daily     gabapentin (NEURONTIN) 100 MG capsule Take 1 capsule (100 mg total) by mouth 3 (three) times daily. 270 capsule 1   No facility-administered medications prior to visit.    ROS Review of Systems  Constitutional: Negative.  Negative for appetite change, diaphoresis and fatigue.  HENT: Negative.    Eyes: Negative.   Respiratory:  Negative for chest tightness, shortness of breath and wheezing.   Cardiovascular:  Negative for chest pain, palpitations and leg swelling.  Gastrointestinal:  Negative for abdominal pain, diarrhea, nausea and vomiting.  Endocrine: Negative.    Genitourinary: Negative.  Negative for difficulty urinating and dysuria.  Musculoskeletal:  Negative for arthralgias, joint swelling and myalgias.  Skin: Negative.  Negative for color change and pallor.  Neurological:  Positive for numbness. Negative for dizziness, weakness and headaches.  Hematological:  Negative for adenopathy. Does not bruise/bleed easily.  Psychiatric/Behavioral: Negative.      Objective:  BP 126/82 (BP Location: Left Arm, Patient Position: Sitting, Cuff Size: Large)   Pulse 80   Temp 98.2 F (36.8 C) (Oral)   Resp 16   Ht '5\' 9"'$  (1.753 m)   Wt 178 lb (80.7 kg)   SpO2 96%   BMI 26.29 kg/m   BP Readings from Last 3 Encounters:  06/27/22 126/82  06/01/22 120/60  04/17/22 130/80    Wt Readings from Last 3 Encounters:  06/27/22 178 lb (80.7 kg)  06/01/22 179 lb (81.2 kg)  05/25/22 180 lb 4 oz (81.8 kg)    Physical Exam Vitals reviewed.  Constitutional:      Appearance: Normal appearance.  HENT:     Nose: Nose normal.     Mouth/Throat:     Mouth: Mucous membranes are moist.  Eyes:     General: No scleral icterus.    Conjunctiva/sclera: Conjunctivae normal.  Cardiovascular:     Rate and Rhythm: Normal rate. Rhythm irregularly irregular.     Heart sounds: Normal heart sounds, S1 normal and S2 normal. No murmur heard.    No friction rub. No gallop.     Comments: EKG- A fib, 80 bpm LAD unchanged Pulmonary:     Effort: Pulmonary effort is normal.     Breath sounds: No stridor. No wheezing, rhonchi or rales.  Abdominal:     General: Abdomen is flat.     Palpations: There is no mass.     Tenderness: There is no abdominal tenderness. There is no guarding.     Hernia: No hernia is present.  Musculoskeletal:     Cervical back: Neck supple. No tenderness.     Right lower leg: No edema.     Left lower leg: No edema.  Lymphadenopathy:     Cervical: No cervical adenopathy.  Skin:    General: Skin is warm and dry.     Findings: No rash.   Neurological:     General: No focal deficit present.     Mental Status: He is alert. Mental status is at baseline.  Psychiatric:        Mood and Affect: Mood normal.        Behavior: Behavior normal.     Lab Results  Component Value Date   WBC 4.6 06/27/2022   HGB 15.2 06/27/2022   HCT 45.2 06/27/2022   PLT 126.0 (L) 06/27/2022   GLUCOSE  121 (H) 06/27/2022   CHOL 129 10/18/2021   TRIG 118 10/18/2021   HDL 45 10/18/2021   LDLCALC 60 10/18/2021   ALT 9 06/27/2022   AST 14 06/27/2022   NA 140 06/27/2022   K 4.6 06/27/2022   CL 102 06/27/2022   CREATININE 1.20 06/27/2022   BUN 20 06/27/2022   CO2 29 06/27/2022   TSH 3.81 06/27/2022   PSA 0.015 12/21/2020   INR 1.2 02/10/2022   HGBA1C 6.5 06/27/2022   MICROALBUR 5.7 01/06/2020    No results found.  Assessment & Plan:   Kauan was seen today for annual exam, diabetes, hyperlipidemia and hypertension.  Diagnoses and all orders for this visit:  PVC's (premature ventricular contractions) -     Basic metabolic panel; Future -     TSH; Future -     EKG 12-Lead -     TSH -     Basic metabolic panel  Persistent atrial fibrillation- He has good rate control. -     TSH; Future -     TSH  Iron deficiency anemia secondary to inadequate dietary iron intake- H/H are normal. -     IBC + Ferritin; Future -     CBC with Differential/Platelet; Future -     CBC with Differential/Platelet -     IBC + Ferritin  Encounter for general adult medical examination with abnormal findings- Exam completed, labs reviewed, vaccines are UTD, no cancer screenings indicated, pt ed material was given.  Vitamin B12 deficiency anemia due to intrinsic factor deficiency- H/H are normal. -     CBC with Differential/Platelet; Future -     Folate; Future -     Folate -     CBC with Differential/Platelet  Diabetes mellitus without complication ([place])- His blood sugar is well controlled. -     Basic metabolic panel; Future -     Hemoglobin A1c;  Future -     Hemoglobin A1c -     Basic metabolic panel  Diabetic neuropathy, painful ([facility]) -     gabapentin (NEURONTIN) 300 MG capsule; Take 1 capsule (300 mg total) by mouth 3 (three) times daily.  Hyperlipidemia with target LDL less than 70- LDL goal achieved. Doing well on the statin  -     Hepatic function panel; Future -     Hepatic function panel  Essential hypertension- BP is well controlled.  Stage 3b chronic kidney disease ([place])- Will avoid nephrotoxic agents.   I have discontinued Princess Bruins. Caraveo "Bill"'s gabapentin. I am also having him start on gabapentin. Additionally, I am having him maintain his Hypromellose (ARTIFICIAL TEARS OP), triamcinolone cream, fexofenadine, acetaminophen, Vitamin D-3, Wheat Dextrin (BENEFIBER DRINK MIX PO), hydrocortisone, levocetirizine, Spiriva Respimat, ipratropium, fluticasone, ACCRUFeR, nitroGLYCERIN, spironolactone, Quviviq, rosuvastatin, dicyclomine, hydrALAZINE, dofetilide, carvedilol, carvedilol, sacubitril-valsartan, benzonatate, apixaban, and promethazine-dextromethorphan.  Meds ordered this encounter  Medications   gabapentin (NEURONTIN) 300 MG capsule    Sig: Take 1 capsule (300 mg total) by mouth 3 (three) times daily.    Dispense:  270 capsule    Refill:  1     Follow-up: Return in about 6 months (around 12/26/2022).  [name], MD

## 2022-07-04 DIAGNOSIS — N3041 Irradiation cystitis with hematuria: Secondary | ICD-10-CM | POA: Diagnosis not present

## 2022-07-04 DIAGNOSIS — N393 Stress incontinence (female) (male): Secondary | ICD-10-CM | POA: Diagnosis not present

## 2022-07-04 DIAGNOSIS — Z8551 Personal history of malignant neoplasm of bladder: Secondary | ICD-10-CM | POA: Diagnosis not present

## 2022-07-04 DIAGNOSIS — Z8546 Personal history of malignant neoplasm of prostate: Secondary | ICD-10-CM | POA: Diagnosis not present

## 2022-07-18 ENCOUNTER — Ambulatory Visit: Payer: PPO | Attending: Cardiovascular Disease | Admitting: Cardiovascular Disease

## 2022-07-18 ENCOUNTER — Encounter: Payer: Self-pay | Admitting: Cardiovascular Disease

## 2022-07-18 VITALS — BP 132/82 | HR 71 | Ht 69.0 in | Wt 179.6 lb

## 2022-07-18 DIAGNOSIS — E785 Hyperlipidemia, unspecified: Secondary | ICD-10-CM

## 2022-07-18 DIAGNOSIS — I4819 Other persistent atrial fibrillation: Secondary | ICD-10-CM | POA: Diagnosis not present

## 2022-07-18 DIAGNOSIS — I10 Essential (primary) hypertension: Secondary | ICD-10-CM | POA: Diagnosis not present

## 2022-07-18 DIAGNOSIS — I2510 Atherosclerotic heart disease of native coronary artery without angina pectoris: Secondary | ICD-10-CM

## 2022-07-18 DIAGNOSIS — I519 Heart disease, unspecified: Secondary | ICD-10-CM | POA: Diagnosis not present

## 2022-07-18 NOTE — Assessment & Plan Note (Signed)
History of paroxysmal A-fib in sinus rhythm as recently as last year on Tikosyn and Eliquis currently back in A-fib by EKG 2 weeks ago although he is asymptomatic.  I am going to refer him back to the A-fib clinic for further evaluation and to see whether he still requires Tikosyn or cardioversion.  Most of the night check a 2D echocardiogram.

## 2022-07-18 NOTE — Patient Instructions (Addendum)
Medication Instructions:  Your physician recommends that you continue on your current medications as directed. Please refer to the Current Medication list given to you today.  *If you need a refill on your cardiac medications before your next appointment, please call your pharmacy*   Testing/Procedures: Your physician has requested that you have an echocardiogram. Echocardiography is a painless test that uses sound waves to create images of your heart. It provides your doctor with information about the size and shape of your heart and how well your heart's chambers and valves are working. This procedure takes approximately one hour. There are no restrictions for this procedure. Please do NOT wear cologne, perfume, aftershave, or lotions (deodorant is allowed). Please arrive 15 minutes prior to your appointment time. This procedure will be done at [address] has requested that you have a carotid duplex. This test is an ultrasound of the carotid arteries in your neck. It looks at blood flow through these arteries that supply the brain with blood. Allow one hour for this exam. There are no restrictions or special instructions. This will take place at [place], [address]. To be done in May.   Follow-Up: At [facility], you and your health needs are our priority.  As part of our continuing mission to provide you with exceptional heart care, we have created designated Provider Care Teams.  These Care Teams include your primary Cardiologist (physician) and Advanced Practice Providers (APPs -  Physician Assistants and Nurse Practitioners) who all work together to provide you with the care you need, when you need it.  We recommend signing up for the patient portal called "MyChart".  Sign up information is provided on this After Visit Summary.  MyChart is used to connect with patients for Virtual Visits (Telemedicine).  Patients are able to view lab/test  results, encounter notes, upcoming appointments, etc.  Non-urgent messages can be sent to your provider as well.   To learn more about what you can do with MyChart, go to [email or web address].    Your next appointment:   6 month(s)  Provider:   Fabian Sharp, PA-C, Sande Rives, PA-C, Caron Presume, PA-C, Jory Sims, DNP, ANP, Almyra Deforest, PA-C, or Diona Browner, NP      Then, [name], MD will plan to see you again in 12 month(s).

## 2022-07-18 NOTE — Progress Notes (Signed)
07/18/2022 ABDULAHI RODICK   1933/05/09  [id number]  Primary Physician [name], MD Primary Cardiologist: [name] MD [name], [name]  HPI:  LATHAN QUIROA is a 89 y.o.  married Caucasian male father of 2 children, grandfather to 2 grandchildren he was formally a patient of Dr. [name]. I last  saw him in the office 06/21/2021.  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 He works out 5 days a week and has joined [employer]. He has a history of coronary artery disease status post angioplasty in 1994 and 1995  Of his circumflex coronary artery. He has not required cardiac catheterization since stress Myoview was performed in 2011 that was low risk and nonischemic. He has a history of hypertension and hyperlipidemia on medication. He had right carotid endarterectomy performed by Dr. [name] Early June 2013 followed by duplex ultrasound. He denies chest pain or shortness of breath. His major complaint is of cramping in his thighs which may be related to his statin drug. He was given a statin holiday which really did not impact his symptoms and therefore statin was restarted and follow-up lipid profile performed 07/05/14 revealed a total cholesterol 131, LDL 58 and HDL of 44. Recent carotid Dopplers revealed his endarterectomy site was widely patent with moderate left ICA stenosis and moderate left subclavian artery stenosis without symptoms. He developed symptoms in August of this year and saw [name]. A Myoview stress test performed 01/09/16 was notable for ischemia in the RCA distribution. Based on this he underwent cardiac catheterization by Dr. [name] 01/16/16 revealing a 90% segmental mid dominant RCA stenosis which was stented with a Synergy 3.5 mm x 20 mm long drug-eluting stent. He did have a very small ramus branch that was 90% stenosed and not suitable for intervention as well as a 70% mid LAD  lesion although he had no evidence of anterior ischemia on his stress test. His symptoms of chest pain have resolved.    Since I saw him this months ago he is remained stable.  He denies chest pain or shortness of breath.  Does have mild ankle edema.  He is in newly recognized atrial fibrillation today.  He does relate a recent episode of hematuria which lasted for 10 days thought to be related related to his prostate and remote prostate cancer status post radioactive seed implantation.  He did see Dr. [name] , his urologist for evaluation and stopped his aspirin briefly with resolution of his hematuria.  He has restarted dual anti-platelet therapy.   He  had a 2D echo that revealed decline in his LV function from 50 to 55% by his last 2D echo 2 years ago to 30 to 35%.  This may be related to his atrial fibrillation.  He feels weak and fatigued with some shortness of breath as well.   Because of relatively new onset chest pain he saw [name] in the office he began him on oral nitrate.  His pain improved but he was referred for diagnostic coronary angiography to define his anatomy.   I performed cardiac catheterization on him 12/26/2017 revealing a patent RCA stent with high-grade mid LAD lesion which underwent PCI and drug-eluting stenting.  He was also found at that time by a left cheek log to have an EF of 25 to 30%.  He was in A. fib.  He subsequently undergone pharmacologic  cardioversion with Tikosyn.  He is on optimal medical therapy for his LV dysfunction and has seen Dr. [name] in the advanced heart failure clinic as recently as 12/01/2018.   His ejection fraction increased to normal after cardioversion.  He was somewhat weak and bradycardic, and his carvedilol was discontinued by Dr. [name] recently resulting in improvement in his symptoms.  He denies chest pain or shortness of breath.  He was in sinus rhythm when he saw Dr. [name] 12/03/2018 and again 11/01/2019.   He and his wife walk 3-4  times a week.  His most recent 2D echo performed l 05/16/2020 revealed preserved EF in the 50 to 55% range.  Event monitor showed 22% PVCs but no A. fib.  Carotid Dopplers performed 10/15/2019 did show moderate left ICA stenosis although his most recent carotid Doppler performed 10/02/2021 did not show ICA stenosis.  Since I saw him a year ago he continues to do well.    He still walks 3 miles a day 4 days a week with his wife.  He denies chest pain or shortness of breath.  He saw his PCP 2 weeks ago and EKG at that time did show A-fib with controlled ventricular sponsor.   Current Meds  Medication Sig   acetaminophen (TYLENOL) 500 MG tablet Take 500 mg by mouth every 6 (six) hours as needed for mild pain or headache.   apixaban (ELIQUIS) 5 MG TABS tablet Take 1 tablet (5 mg total) by mouth 2 (two) times daily.   benzonatate (TESSALON) 100 MG capsule Take 1 capsule (100 mg total) by mouth 2 (two) times daily as needed for cough.   carvedilol (COREG) 3.125 MG tablet TAKE 1 TABLET BY MOUTH TWICE A DAY WITH A MEAL   carvedilol (COREG) 6.25 MG tablet TAKE 1 & 1/2 BY MOUTH 2 TIMES DAILY WITH A MEAL. (Patient taking differently: Take 1 BY MOUTH 2 TIMES DAILY WITH A MEAL)   Cholecalciferol (VITAMIN D-3) 25 MCG (1000 UT) CAPS Take 1,000 Units by mouth daily.   Daridorexant HCl (QUVIVIQ) 25 MG TABS Take 1 tablet by mouth at bedtime.   dicyclomine (BENTYL) 10 MG capsule TAKE 1 CAPSULE (10 MG TOTAL) BY MOUTH EVERY 6 (SIX) HOURS AS NEEDED (ABDOMINAL PAIN).   dofetilide (TIKOSYN) 125 MCG capsule TAKE 1 CAPSULE BY MOUTH TWICE DAILY   Ferric Maltol (ACCRUFER) 30 MG CAPS Take 1 capsule by mouth in the morning and at bedtime.   fexofenadine (ALLEGRA) 180 MG tablet Take 180 mg by mouth daily as needed for allergies or rhinitis.    fluticasone (FLONASE) 50 MCG/ACT nasal spray Place 1 spray into both nostrils daily.   gabapentin (NEURONTIN) 300 MG capsule Take 1 capsule (300 mg total) by mouth 3 (three) times daily.    hydrALAZINE (APRESOLINE) 25 MG tablet Take 25 mg by mouth 3 (three) times daily. As need when BP is 160   hydrocortisone (ANUSOL-HC) 2.5 % rectal cream Place 1 application rectally 3 (three) times daily as needed for hemorrhoids.   Hypromellose (ARTIFICIAL TEARS OP) Place 1 drop into both eyes 4 (four) times daily as needed (dry eyes).   ipratropium (ATROVENT) 0.06 % nasal spray Place 2 sprays into both nostrils 4 (four) times daily as needed for rhinitis.   levocetirizine (XYZAL) 5 MG tablet Take 1 tablet (5 mg total) by mouth every evening.   nitroGLYCERIN (NITROSTAT) 0.4 MG SL tablet Place 1 tablet (0.4 mg total) under the tongue every 5 (five) minutes as needed for chest  pain.   promethazine-dextromethorphan (PROMETHAZINE-DM) 6.25-15 MG/5ML syrup Take 5 mLs by mouth 4 (four) times daily as needed.   rosuvastatin (CRESTOR) 10 MG tablet TAKE 1 TABLET BY MOUTH EVERY DAY   sacubitril-valsartan (ENTRESTO) 97-103 MG Take 1 tablet by mouth 2 (two) times daily.   spironolactone (ALDACTONE) 25 MG tablet TAKE 2 TABLETS BY MOUTH EVERY DAY (Patient taking differently: Take 25 mg by mouth daily.)   Tiotropium Bromide Monohydrate (SPIRIVA RESPIMAT) 2.5 MCG/ACT AERS Inhale 2 puffs into the lungs daily.   triamcinolone cream (KENALOG) 0.1 % Apply 1 application topically daily as needed (to affected areas, for itching).    Wheat Dextrin (BENEFIBER DRINK MIX PO) Take 2 Scoops by mouth daily in the afternoon. Take 2 tablespoons daily     Allergies  Allergen Reactions   Lipitor [Atorvastatin] Other (See Comments)    Muscle pain   Other Rash and Other (See Comments)    Detergent- Rashes on the skin    Social History   Socioeconomic History   Marital status: Married    Spouse name: Not on file   Number of children: 2   Years of education: Not on file   Highest education level: Not on file  Occupational History   Occupation: [employer]  Tobacco Use   Smoking status: Former    Packs/day:  1.50    Years: 60.00    Total pack years: 90.00    Types: Cigarettes    Quit date: 07/25/2008    Years since quitting: 13.9   Smokeless tobacco: Never  Vaping Use   Vaping Use: Never used  Substance and Sexual Activity   Alcohol use: Not Currently    Comment: occassionally   Drug use: No   Sexual activity: Not Currently  Other Topics Concern   Not on file  Social History Narrative   Married second time   Retired [employer] yrs   2 daughters and 2 stepdaughters   Social Determinants of Health   Financial Resource Strain: Low Risk  (09/25/2021)   Overall Financial Resource Strain (CARDIA)    Difficulty of Paying Living Expenses: Not hard at all  Food Insecurity: No Food Insecurity (09/25/2021)   Hunger Vital Sign    Worried About Running Out of Food in the Last Year: Never true    [place] in the Last Year: Never true  Transportation Needs: No Transportation Needs (09/25/2021)   PRAPARE - Hydrologist (Medical): No    Lack of Transportation (Non-Medical): No  Physical Activity: Sufficiently Active (09/25/2021)   Exercise Vital Sign    Days of Exercise per Week: 5 days    Minutes of Exercise per Session: 30 min  Stress: No Stress Concern Present (09/25/2021)   [place]    Feeling of Stress : Not at all  Social Connections: [place] (09/25/2021)   Social Connection and Isolation Panel [NHANES]    Frequency of Communication with Friends and Family: More than three times a week    Frequency of Social Gatherings with Friends and Family: More than three times a week    Attends Religious Services: More than 4 times per year    Active Member of [employer] or Organizations: Yes    Attends Archivist Meetings: More than 4 times per year    Marital Status: Married  Human resources officer Violence: Not At Risk (09/25/2021)   Humiliation, Afraid, Rape,  and Kick  questionnaire    Fear of Current or Ex-Partner: No    Emotionally Abused: No    Physically Abused: No    Sexually Abused: No     Review of Systems: General: negative for chills, fever, night sweats or weight changes.  Cardiovascular: negative for chest pain, dyspnea on exertion, edema, orthopnea, palpitations, paroxysmal nocturnal dyspnea or shortness of breath Dermatological: negative for rash Respiratory: negative for cough or wheezing Urologic: negative for hematuria Abdominal: negative for nausea, vomiting, diarrhea, bright red blood per rectum, melena, or hematemesis Neurologic: negative for visual changes, syncope, or dizziness All other systems reviewed and are otherwise negative except as noted above.    Blood pressure 132/82, pulse 71, height 5' 9"$  (1.753 m), weight 179 lb 9.6 oz (81.5 kg), SpO2 96 %.  General appearance: alert and no distress Neck: no adenopathy, no carotid bruit, no JVD, supple, symmetrical, trachea midline, and thyroid not enlarged, symmetric, no tenderness/mass/nodules Lungs: clear to auscultation bilaterally Heart: irregularly irregular rhythm Extremities: extremities normal, atraumatic, no cyanosis or edema Pulses: 2+ and symmetric Skin: Skin color, texture, turgor normal. No rashes or lesions Neurologic: Grossly normal  EKG not performed today  ASSESSMENT AND PLAN:   Hyperlipidemia with target LDL less than 70 History of hyperlipidemia on Crestor 10 mg a day with lipid profile performed 5//23 revealing total cholesterol 129, LDL 60 and HDL 45.  Essential hypertension History of essential hypertension blood pressure measured today at 132/82.  He is on carvedilol, hydralazine, Entresto and spironolactone.  Left ventricular dysfunction History of left ventricular dysfunction in the past thought to be mixed ischemic and arrhythmogenic which improved after chemical cardioversion with Tikosyn to sinus rhythm.  His most recent 2D echocardiogram  performed 11/03/2021 revealed normal LV systolic function without valvular abnormalities.  I am going to repeat a 2D echo since he is in A-fib today although he is otherwise asymptomatic.  He is on guideline directed optimal medical therapy as well.  CAD (coronary artery disease) History of CAD status post angioplasty by myself in 1994 in 1995 of the circumflex coronary artery.  He was Again in 2011 and again by Dr. [name] 01/16/2016 at which time he had a 90% segmental mid RCA stented using a Synergy 3.5 mm x 20 mm long drug-eluting stent.  He does have a very small ramus branch with a 90% stenosis that was not suitable for intervention and 70% mid LAD lesion.  I catheterized him 12/26/2017 revealing a patent RCA stent with high-grade mid LAD stenosis which I stented.  He currently denies chest pain.  Persistent atrial fibrillation History of paroxysmal A-fib in sinus rhythm as recently as last year on Tikosyn and Eliquis currently back in A-fib by EKG 2 weeks ago although he is asymptomatic.  I am going to refer him back to the A-fib clinic for further evaluation and to see whether he still requires Tikosyn or cardioversion.  Most of the night check a 2D echocardiogram.     [name] MD [facility], [facility] 07/18/2022 10:34 AM

## 2022-07-18 NOTE — Assessment & Plan Note (Signed)
History of essential hypertension blood pressure measured today at 132/82.  He is on carvedilol, hydralazine, Entresto and spironolactone.

## 2022-07-18 NOTE — Assessment & Plan Note (Signed)
History of left ventricular dysfunction in the past thought to be mixed ischemic and arrhythmogenic which improved after chemical cardioversion with Tikosyn to sinus rhythm.  His most recent 2D echocardiogram performed 11/03/2021 revealed normal LV systolic function without valvular abnormalities.  I am going to repeat a 2D echo since he is in A-fib today although he is otherwise asymptomatic.  He is on guideline directed optimal medical therapy as well.

## 2022-07-18 NOTE — Assessment & Plan Note (Signed)
History of CAD status post angioplasty by myself in 1994 in 1995 of the circumflex coronary artery.  He was Again in 2011 and again by Dr. [name] 01/16/2016 at which time he had a 90% segmental mid RCA stented using a Synergy 3.5 mm x 20 mm long drug-eluting stent.  He does have a very small ramus branch with a 90% stenosis that was not suitable for intervention and 70% mid LAD lesion.  I catheterized him 12/26/2017 revealing a patent RCA stent with high-grade mid LAD stenosis which I stented.  He currently denies chest pain.

## 2022-07-18 NOTE — Assessment & Plan Note (Signed)
History of hyperlipidemia on Crestor 10 mg a day with lipid profile performed 5//23 revealing total cholesterol 129, LDL 60 and HDL 45.

## 2022-07-19 DIAGNOSIS — C678 Malignant neoplasm of overlapping sites of bladder: Secondary | ICD-10-CM | POA: Diagnosis not present

## 2022-07-19 DIAGNOSIS — Z5111 Encounter for antineoplastic chemotherapy: Secondary | ICD-10-CM | POA: Diagnosis not present

## 2022-07-21 ENCOUNTER — Other Ambulatory Visit: Payer: Self-pay | Admitting: Internal Medicine

## 2022-07-21 DIAGNOSIS — I2510 Atherosclerotic heart disease of native coronary artery without angina pectoris: Secondary | ICD-10-CM

## 2022-07-21 DIAGNOSIS — I10 Essential (primary) hypertension: Secondary | ICD-10-CM

## 2022-07-21 DIAGNOSIS — I519 Heart disease, unspecified: Secondary | ICD-10-CM

## 2022-07-21 DIAGNOSIS — I4819 Other persistent atrial fibrillation: Secondary | ICD-10-CM

## 2022-07-25 DIAGNOSIS — C678 Malignant neoplasm of overlapping sites of bladder: Secondary | ICD-10-CM | POA: Diagnosis not present

## 2022-07-25 DIAGNOSIS — Z5111 Encounter for antineoplastic chemotherapy: Secondary | ICD-10-CM | POA: Diagnosis not present

## 2022-07-26 ENCOUNTER — Ambulatory Visit (HOSPITAL_COMMUNITY)
Admission: RE | Admit: 2022-07-26 | Discharge: 2022-07-26 | Disposition: A | Discharge status: Home / Self Care | Payer: PPO | Source: Ambulatory Visit | Attending: Nurse Practitioner | Admitting: Nurse Practitioner

## 2022-07-26 VITALS — BP 144/80 | HR 80 | Ht 69.0 in | Wt 177.4 lb

## 2022-07-26 DIAGNOSIS — D6869 Other thrombophilia: Secondary | ICD-10-CM

## 2022-07-26 DIAGNOSIS — I2510 Atherosclerotic heart disease of native coronary artery without angina pectoris: Secondary | ICD-10-CM | POA: Diagnosis not present

## 2022-07-26 DIAGNOSIS — I4819 Other persistent atrial fibrillation: Secondary | ICD-10-CM | POA: Diagnosis not present

## 2022-07-26 DIAGNOSIS — I4891 Unspecified atrial fibrillation: Secondary | ICD-10-CM | POA: Insufficient documentation

## 2022-07-26 LAB — BASIC METABOLIC PANEL
Anion gap: 12 (ref 5–15)
BUN: 12 mg/dL (ref 8–23)
CO2: 25 mmol/L (ref 22–32)
Calcium: 9.1 mg/dL (ref 8.9–10.3)
Chloride: 103 mmol/L (ref 98–111)
Creatinine, Ser: 1.02 mg/dL (ref 0.61–1.24)
GFR, Estimated: >60 mL/min (ref >60)
Glucose, Bld: 110 mg/dL — ABNORMAL HIGH (ref 70–99)
Potassium: 3.7 mmol/L (ref 3.5–5.1)
Sodium: 140 mmol/L (ref 135–145)

## 2022-07-26 LAB — CBC
HCT: 44.1 % (ref 39.0–52.0)
Hemoglobin: 14.3 g/dL (ref 13.0–17.0)
MCH: 28.3 pg (ref 26.0–34.0)
MCHC: 32.4 g/dL (ref 30.0–36.0)
MCV: 87.2 fL (ref 80.0–100.0)
Platelets: 138 10*3/uL — ABNORMAL LOW (ref 150–400)
RBC: 5.06 MIL/uL (ref 4.22–5.81)
RDW: 13.2 % (ref 11.5–15.5)
WBC: 5 10*3/uL (ref 4.0–10.5)
nRBC: 0 % (ref 0.0–0.2)

## 2022-07-26 LAB — MAGNESIUM: Magnesium: 2.1 mg/dL (ref 1.7–2.4)

## 2022-07-26 NOTE — Progress Notes (Addendum)
Primary Care Physician: [name], MD Referring Physician:Dr. [name] EP; Dr. Soundra Pilon SIRRON LOGA is a 89 y.o. male with a h/o coronary artery disease status post angioplasty in 1994 and 1995 of his circumflex coronary artery. . He has a history of hypertension and hyperlipidemia on medication. He had right carotid endarterectomy performed by Dr. Curt Jews June 2013.Myoview stress test performed 01/09/16 was notable for ischemia in the RCA distribution. Based on this he underwent cardiac catheterization by Dr. [name] 01/16/16 revealing a 90% segmental mid dominant RCA stenosis which was stented with a Synergy 3.5 mm x 20 mm long drug-eluting stent. He did have a very small ramus branch that was 90% stenosed and not suitable for intervention as well as a 70% mid LAD lesion although he had no evidence of anterior ischemia on his stress test. His symptoms of chest pain have resolved.   He was diagnosed in May 2019 with new  onset  afib from which the pt is asymptomatic. Echo was done and showed EF decreased to 30-35%, normal EF by echo in August 2017. He has been on Eliquis 5 mg bid since early May without missed doses.  Plavix were stopped at that time, ASA continued and pt was started on carvedilol 6.25 mg bid.He states that he had a sleep study in the past but never slept enough with the study to get good results. He is afraid that he would never be able to tolerate a cpap. Sleeps in a recliner now, " I just feel more comfortable".  F/u in afib clinic 10/03/17, unfortunately cardioversion only held for 1-2 days.He developed  exertional chest discomfort   and was seen by R. Barrett, PA and had a cardiac cath which pt did receive a stent in the LAD. He feels improved but still remains in afib.  He did have to hold anticoagulation for 1-2 days and was back on anticoagulation without interruption on 8/23.  We did discuss trying to restore SR with EF moderately reduced and he would like to purse  after his vacation at the end of the month.  F/u in afib clinic, 01/31/18. He is back in afib clinic to discuss Tikosyn admit.  He has recently seen Dr. [name] and he also encouraged pt to consider Tiksoyn for his LV dysfunction.Marland Kitchen He is feeling better and was hoping he could get by without drug. He will commit but wants to wait until 10/1. He also reports that he has been having h/a's ever since he started Imdur. I discussed with Doroteo Bradford, PharmD, and  Imdur was eventually stopped with resolution of H/A.  Mr. Shuey is now back in afib clinic for tikosyn admit.  No misses eliquis doses and no benadryl use.PharmD has screened drugs and no qt prolonging drugs. He will be applying for drug assistance and papers received and reviewed.  F/u 10/28, s/p Tikosyn admit. He is in SR with 125 mcg of Tikosyn. He will be getting drug thru pharmacy  assistance program. He feels improved.  F/u Tikosyn 11/21, he continues in [place]. He has a sinus infection and is taking cortisone and antibiotic currently. qtc 463 ms. By Marthann Schiller he appears to have bigeminal PVC's but rhythm was regular when auscultated.  F/u in  afib clinic, 07/26/22. He was sent her by Dr. [name] as he found him in afib  2/21. He is rate controled and minimally symptomatic with fatigue . He continues on tikosyn 125 mcg bid. EKG today shows rate controlled afib.  Today, he denies symptoms of palpitations, shortness of breath, orthopnea, PND, lower extremity edema, dizziness, presyncope, syncope, or neurologic sequela.  The patient is tolerating medications without difficulties and is otherwise without complaint today.   Past Medical History:  Diagnosis Date   Adenomatous colon polyp    Allergic rhinitis    Anemia    Anxiety    Bladder cancer ([place])    currently on bcg treatments   BPH (benign prostatic hyperplasia)    CAD (coronary artery disease)    sees Dr. [name]    Carotid artery disease [facility])    CHF (congestive heart failure) ([place])     Chronic kidney disease    Sones. Prostate cancer   Clotting disorder ([facility])    CVA (cerebral infarction)    Diabetes mellitus    "Borderline"   Diverticulitis    Dysrhythmia    atrial fib - hx of    Edema    recent swelling in feet and ankles    GERD (gastroesophageal reflux disease)    history   Headache    Hemorrhoids    History of kidney stones    HTN (hypertension)    Hx of radiation therapy    for prostate - 2013    Hyperlipidemia LDL goal <70    IBS (irritable bowel syndrome)    Low back pain    Pre-diabetes    Prostate cancer ([place]) 2008   seed implantation   Sleep apnea    sitting in recliner helps him to breathe per pt , never used cpap sleep study - pt reports could not go to sleep- 6 years ago    Smoker    Stroke [facility])    hx of mini strokes years ago    Past Surgical History:  Procedure Laterality Date   APPENDECTOMY     CARDIAC CATHETERIZATION N/A 01/16/2016   Procedure: Left Heart Cath and Coronary Angiography;  Surgeon: Jettie Booze, MD;  Location: [facility];  Service: Cardiovascular;  Laterality: N/A;   CARDIAC CATHETERIZATION N/A 01/16/2016   Procedure: Coronary Stent Intervention;  Surgeon: Jettie Booze, MD;  Location: [facility];  Service: Cardiovascular;  Laterality: N/A;   CARDIOVERSION N/A 11/26/2017   Procedure: CARDIOVERSION;  Surgeon: [name], MD;  Location: [facility] ENDOSCOPY;  Service: Cardiovascular;  Laterality: N/A;   CARDIOVERSION N/A 03/13/2018   Procedure: CARDIOVERSION;  Surgeon: [name], MD;  Location: [facility] ENDOSCOPY;  Service: Cardiovascular;  Laterality: N/A;   CATARACT EXTRACTION W/ INTRAOCULAR LENS  IMPLANT, BILATERAL Bilateral    COLONOSCOPY  10/21/2008   diverticulosis, radiation proctitis, internal hemorrhoids   CORONARY ANGIOPLASTY     CORONARY STENT INTERVENTION N/A 12/26/2017   Procedure: CORONARY STENT INTERVENTION;  Surgeon: [name], MD;  Location: [facility];  Service:  Cardiovascular;  Laterality: N/A;   ENDARTERECTOMY  11/12/2011   Procedure: ENDARTERECTOMY CAROTID;  Surgeon: Rosetta Posner, MD;  Location: [facility];  Service: Vascular;  Laterality: Right;   ESOPHAGOGASTRODUODENOSCOPY (EGD) WITH ESOPHAGEAL DILATION     HEMORRHOID BANDING     LEFT HEART CATH AND CORONARY ANGIOGRAPHY N/A 12/26/2017   Procedure: LEFT HEART CATH AND CORONARY ANGIOGRAPHY;  Surgeon: [name], MD;  Location: [facility];  Service: Cardiovascular;  Laterality: N/A;   LITHOTRIPSY  X 3   LUMBAR LAMINECTOMY     TRANSURETHRAL RESECTION OF BLADDER TUMOR WITH MITOMYCIN-C Bilateral 01/06/2021   Procedure: TRANSURETHRAL RESECTION OF BLADDER TUMOR WITH GEMCITABINE  BILATERAL RETROGRADES;  Surgeon: Irine Seal, MD;  Location: [facility];  Service: Urology;  Laterality: Bilateral;   TRANSURETHRAL RESECTION OF PROSTATE  10/2003   TRANSURETHRAL RESECTION OF PROSTATE N/A 05/31/2020   Procedure: CYSTOSCOPY TRANSURETHRAL RESECTION BIOPSY AND FULGURATION OF PROSTATE LESION  (TURP);  Surgeon: Irine Seal, MD;  Location: [facility];  Service: Urology;  Laterality: N/A;    Current Outpatient Medications  Medication Sig Dispense Refill   acetaminophen (TYLENOL) 500 MG tablet Take 500 mg by mouth as needed for mild pain or headache.     apixaban (ELIQUIS) 5 MG TABS tablet Take 1 tablet (5 mg total) by mouth 2 (two) times daily. 180 tablet 3   benzonatate (TESSALON) 100 MG capsule Take 1 capsule (100 mg total) by mouth 2 (two) times daily as needed for cough. 20 capsule 0   carvedilol (COREG) 3.125 MG tablet TAKE 1 TABLET BY MOUTH TWICE A DAY WITH FOOD 180 tablet 0   carvedilol (COREG) 6.25 MG tablet TAKE 1 & 1/2 BY MOUTH 2 TIMES DAILY WITH A MEAL. (Patient taking differently: Take 1 BY MOUTH 2 TIMES DAILY WITH A MEAL) 270 tablet 1   Cholecalciferol (VITAMIN D-3) 25 MCG (1000 UT) CAPS Take 1,000 Units by mouth daily.     Daridorexant HCl (QUVIVIQ) 25 MG TABS Take 1 tablet by mouth at bedtime. (Patient taking  differently: Take 1 tablet by mouth as needed.) 90 tablet 1   dicyclomine (BENTYL) 10 MG capsule TAKE 1 CAPSULE (10 MG TOTAL) BY MOUTH EVERY 6 (SIX) HOURS AS NEEDED (ABDOMINAL PAIN). 30 capsule 1   dofetilide (TIKOSYN) 125 MCG capsule TAKE 1 CAPSULE BY MOUTH TWICE DAILY 180 capsule 3   fexofenadine (ALLEGRA) 180 MG tablet Take 180 mg by mouth daily as needed for allergies or rhinitis.      fluticasone (FLONASE) 50 MCG/ACT nasal spray Place 1 spray into both nostrils daily.     gabapentin (NEURONTIN) 300 MG capsule Take 1 capsule (300 mg total) by mouth 3 (three) times daily. 270 capsule 1   hydrALAZINE (APRESOLINE) 25 MG tablet Take 25 mg by mouth 3 (three) times daily. As need when BP is 160     hydrocortisone (ANUSOL-HC) 2.5 % rectal cream Place 1 application rectally 3 (three) times daily as needed for hemorrhoids. 30 g 1   Hypromellose (ARTIFICIAL TEARS OP) Place 1 drop into both eyes 4 (four) times daily as needed (dry eyes).     ipratropium (ATROVENT) 0.06 % nasal spray Place 2 sprays into both nostrils 4 (four) times daily as needed for rhinitis. 90 mL 1   levocetirizine (XYZAL) 5 MG tablet Take 1 tablet (5 mg total) by mouth every evening. 90 tablet 1   nitroGLYCERIN (NITROSTAT) 0.4 MG SL tablet Place 1 tablet (0.4 mg total) under the tongue every 5 (five) minutes as needed for chest pain. 25 tablet 3   rosuvastatin (CRESTOR) 10 MG tablet TAKE 1 TABLET BY MOUTH EVERY DAY 90 tablet 1   sacubitril-valsartan (ENTRESTO) 97-103 MG Take 1 tablet by mouth 2 (two) times daily. 180 tablet 3   spironolactone (ALDACTONE) 25 MG tablet TAKE 2 TABLETS BY MOUTH EVERY DAY (Patient taking differently: Take 25 mg by mouth daily.) 180 tablet 3   Tiotropium Bromide Monohydrate (SPIRIVA RESPIMAT) 2.5 MCG/ACT AERS Inhale 2 puffs into the lungs daily. 4 g 11   triamcinolone cream (KENALOG) 0.1 % Apply 1 application topically daily as needed (to affected areas, for itching).      Wheat Dextrin (BENEFIBER  DRINK MIX  PO) Take 2 Scoops by mouth daily in the afternoon. Take 2 tablespoons daily     No current facility-administered medications for this encounter.    Allergies  Allergen Reactions   Lipitor [Atorvastatin] Other (See Comments)    Muscle pain   Other Rash and Other (See Comments)    Detergent- Rashes on the skin    Social History   Socioeconomic History   Marital status: Married    Spouse name: Not on file   Number of children: 2   Years of education: Not on file   Highest education level: Not on file  Occupational History   Occupation: [employer]  Tobacco Use   Smoking status: Former    Packs/day: 1.50    Years: 60.00    Total pack years: 90.00    Types: Cigarettes    Quit date: 07/25/2008    Years since quitting: 14.0   Smokeless tobacco: Never  Vaping Use   Vaping Use: Never used  Substance and Sexual Activity   Alcohol use: Not Currently    Comment: occassionally   Drug use: No   Sexual activity: Not Currently  Other Topics Concern   Not on file  Social History Narrative   Married second time   Retired [place] yrs   2 daughters and 2 stepdaughters   Social Determinants of Health   Financial Resource Strain: Low Risk  (09/25/2021)   Overall Financial Resource Strain (CARDIA)    Difficulty of Paying Living Expenses: Not hard at all  Food Insecurity: No Food Insecurity (09/25/2021)   Hunger Vital Sign    Worried About Running Out of Food in the Last Year: Never true    [place] in the Last Year: Never true  Transportation Needs: No Transportation Needs (09/25/2021)   PRAPARE - Hydrologist (Medical): No    Lack of Transportation (Non-Medical): No  Physical Activity: Sufficiently Active (09/25/2021)   Exercise Vital Sign    Days of Exercise per Week: 5 days    Minutes of Exercise per Session: 30 min  Stress: No Stress Concern Present (09/25/2021)   [place]    Feeling of Stress : Not at all  Social Connections: Socially Integrated (09/25/2021)   Social Connection and Isolation Panel [NHANES]    Frequency of Communication with Friends and Family: More than three times a week    Frequency of Social Gatherings with Friends and Family: More than three times a week    Attends Religious Services: More than 4 times per year    Active Member of [employer] or Organizations: Yes    Attends Music therapist: More than 4 times per year    Marital Status: Married  Human resources officer Violence: Not At Risk (09/25/2021)   Humiliation, Afraid, Rape, and Kick questionnaire    Fear of Current or Ex-Partner: No    Emotionally Abused: No    Physically Abused: No    Sexually Abused: No    Family History  Problem Relation Age of Onset   Heart disease Father    Hypertension Mother    Asthma Paternal Grandmother    Colon cancer Neg Hx    Esophageal cancer Neg Hx    Stomach cancer Neg Hx    Rectal cancer Neg Hx    Liver cancer Neg Hx     ROS- All systems are reviewed and  negative except as per the HPI above  Physical Exam: Vitals:   07/26/22 0920  Weight: 80.5 kg  Height: '5\' 9"'$  (1.753 m)   Wt Readings from Last 3 Encounters:  07/26/22 80.5 kg  07/18/22 81.5 kg  06/27/22 80.7 kg    Labs: Lab Results  Component Value Date   NA 140 06/27/2022   K 4.6 06/27/2022   CL 102 06/27/2022   CO2 29 06/27/2022   GLUCOSE 121 (H) 06/27/2022   BUN 20 06/27/2022   CREATININE 1.20 06/27/2022   CALCIUM 9.7 06/27/2022   MG 2.1 04/17/2022   Lab Results  Component Value Date   INR 1.2 02/10/2022   Lab Results  Component Value Date   CHOL 129 10/18/2021   HDL 45 10/18/2021   LDLCALC 60 10/18/2021   TRIG 118 10/18/2021     GEN- The patient is well appearing, alert and oriented x 3 today.   Head- normocephalic, atraumatic Eyes-  Sclera clear, conjunctiva pink Ears- hearing intact Oropharynx- clear Neck-  supple, no JVP Lymph- no cervical lymphadenopathy Lungs- Clear to ausculation bilaterally, normal work of breathing Heart- irregular rate and rhythm, no murmurs, rubs or gallops, PMI not laterally displaced GI- soft, NT, ND, + BS Extremities- no clubbing, cyanosis, or edema MS- no significant deformity or atrophy Skin- no rash or lesion Psych- euthymic mood, full affect Neuro- strength and sensation are intact  EKG- Vent. rate 80 BPM PR interval * ms QRS duration 94 ms QT/QTcB 370/426 ms P-R-T axes * -46 -17 Atrial fibrillation with premature ventricular or aberrantly conducted complexes Left axis deviation Abnormal ECG When compared with ECG of 17-Apr-2022 09:12, PREVIOUS ECG IS PRESENT   Echo- 11/03/21- showed EF of 55 to 65%, mild LVH, left atrium mildly elevated   Assessment and Plan: 1. New onset afib May 2019 Has been maintained in SR on Tikosyn 125 mcg bid for many years  Recently found to be in rate controlled afib, symptomatic with fatigue for a few weeks  Discussed pursing cardioversion and he is in agreement  Continue tikosyn 125 mcg bid and carvedilol 3.125 gm bid  Cbc/bmet/mag  2. CHA2DS2VASc  score of at least 5 Continue 5 mg bid  No missed doses x at least 3 weeks   3. CAD Stable  F/u in afib clinic one week after cardioversion   Butch Penny C. Vanna Sailer, [facility] [address] [place], [place] [address] [phone]

## 2022-07-26 NOTE — Patient Instructions (Addendum)
Cardioversion scheduled for: Tuesday, March 5th    - Arrive at the [facility] and go to admitting at 930am   - Do not eat or drink anything after midnight the night prior to your procedure.   - Take all your morning medication (except diabetic medications) with a sip of water prior to arrival.  - You will not be able to drive home after your procedure.    - Do NOT miss any doses of your blood thinner - if you should miss a dose please notify our office immediately.   - If you feel as if you go back into normal rhythm prior to scheduled cardioversion, please notify our office immediately.   If your procedure is canceled in the cardioversion suite you will be charged a cancellation fee.    Hold medication 7 days prior to scheduled procedure/anesthesia.  Restart medication on the normal dosing day after scheduled procedure/anesthesia  Dulaglutide (Trulicity) Exenatide extended release (Bydureon bcise) Semaglutide (Ozempic) (WEGOVY)  Tirzepatide (Mounjaro)     Hold medication 24 hours prior to scheduled procedure/anesthesia.   Restart medication on the following day after scheduled procedure/anesthesia   Exenatide (Byetta)  Liraglutide (Victoza, Saxenda)  Lixisenatide (Adlyxin)  Semaglutide (Rybelsus) Polyethylene Glycol Loxenatide   For those patients who have a scheduled procedure/anesthesia on the same day of the week as their dose, hold the medication on the day of surgery.  They can take their scheduled dose the week before.  **Patients on the above medications scheduled for elective procedures that have not held the medication for the appropriate amount of time are at risk of cancellation or change in the anesthetic plan.

## 2022-07-27 ENCOUNTER — Other Ambulatory Visit (HOSPITAL_COMMUNITY): Payer: Self-pay

## 2022-07-27 DIAGNOSIS — I4891 Unspecified atrial fibrillation: Secondary | ICD-10-CM

## 2022-07-27 MED ORDER — POTASSIUM CHLORIDE CRYS ER 20 MEQ PO TBCR: EXTENDED_RELEASE_TABLET | ORAL | 6 refills | Status: DC

## 2022-07-31 ENCOUNTER — Ambulatory Visit (HOSPITAL_COMMUNITY): Payer: PPO | Admitting: Nurse Practitioner

## 2022-07-31 ENCOUNTER — Ambulatory Visit (HOSPITAL_BASED_OUTPATIENT_CLINIC_OR_DEPARTMENT_OTHER): Payer: PPO | Admitting: Anesthesiology

## 2022-07-31 ENCOUNTER — Other Ambulatory Visit: Payer: Self-pay

## 2022-07-31 ENCOUNTER — Ambulatory Visit (HOSPITAL_COMMUNITY): Payer: PPO | Admitting: Anesthesiology

## 2022-07-31 ENCOUNTER — Encounter (HOSPITAL_COMMUNITY)
Admission: RE | Disposition: A | Discharge status: Home / Self Care | Payer: Self-pay | Source: Ambulatory Visit | Attending: Cardiology

## 2022-07-31 ENCOUNTER — Ambulatory Visit (HOSPITAL_COMMUNITY)
Admission: RE | Admit: 2022-07-31 | Discharge: 2022-07-31 | Disposition: A | Discharge status: Home / Self Care | Payer: PPO | Source: Ambulatory Visit | Attending: Cardiology | Admitting: Cardiology

## 2022-07-31 ENCOUNTER — Encounter (HOSPITAL_COMMUNITY): Payer: Self-pay | Admitting: Cardiology

## 2022-07-31 DIAGNOSIS — Z955 Presence of coronary angioplasty implant and graft: Secondary | ICD-10-CM | POA: Insufficient documentation

## 2022-07-31 DIAGNOSIS — I509 Heart failure, unspecified: Secondary | ICD-10-CM | POA: Diagnosis not present

## 2022-07-31 DIAGNOSIS — I2510 Atherosclerotic heart disease of native coronary artery without angina pectoris: Secondary | ICD-10-CM | POA: Diagnosis not present

## 2022-07-31 DIAGNOSIS — M1990 Unspecified osteoarthritis, unspecified site: Secondary | ICD-10-CM | POA: Insufficient documentation

## 2022-07-31 DIAGNOSIS — E785 Hyperlipidemia, unspecified: Secondary | ICD-10-CM | POA: Diagnosis not present

## 2022-07-31 DIAGNOSIS — Z87891 Personal history of nicotine dependence: Secondary | ICD-10-CM | POA: Diagnosis not present

## 2022-07-31 DIAGNOSIS — I4819 Other persistent atrial fibrillation: Secondary | ICD-10-CM | POA: Diagnosis not present

## 2022-07-31 DIAGNOSIS — Z7901 Long term (current) use of anticoagulants: Secondary | ICD-10-CM | POA: Diagnosis not present

## 2022-07-31 DIAGNOSIS — E119 Type 2 diabetes mellitus without complications: Secondary | ICD-10-CM | POA: Insufficient documentation

## 2022-07-31 DIAGNOSIS — I4891 Unspecified atrial fibrillation: Secondary | ICD-10-CM

## 2022-07-31 DIAGNOSIS — G4730 Sleep apnea, unspecified: Secondary | ICD-10-CM | POA: Diagnosis not present

## 2022-07-31 DIAGNOSIS — I110 Hypertensive heart disease with heart failure: Secondary | ICD-10-CM | POA: Insufficient documentation

## 2022-07-31 DIAGNOSIS — Z8673 Personal history of transient ischemic attack (TIA), and cerebral infarction without residual deficits: Secondary | ICD-10-CM | POA: Diagnosis not present

## 2022-07-31 HISTORY — PX: CARDIOVERSION: SHX1299

## 2022-07-31 SURGERY — CARDIOVERSION
Anesthesia: General

## 2022-07-31 MED ORDER — PROPOFOL 10 MG/ML IV BOLUS
INTRAVENOUS | Status: DC | PRN
  Administered 2022-07-31: 60 mg via INTRAVENOUS

## 2022-07-31 MED ORDER — SODIUM CHLORIDE 0.9 % IV SOLN: INTRAVENOUS | Status: DC

## 2022-07-31 MED ORDER — LIDOCAINE 2% (20 MG/ML) 5 ML SYRINGE
INTRAMUSCULAR | Status: DC | PRN
  Administered 2022-07-31: 40 mg via INTRAVENOUS

## 2022-07-31 NOTE — H&P (Signed)
ATRIAL FIB NEW PATIENT 07/26/2022 [facility] [place], NP Cardiology Persistent atrial fibrillation +1 more Dx Referred by Dillon Lima, MD Reason for Visit   Additional Documentation  Vitals: BP 144/80 Important    Pulse 80   Ht '5\' 9"'$  (1.753 m)   Wt 80.5 kg   BMI 26.20 kg/m   BSA 1.98 m      More Vitals  Flowsheets: Anthropometrics,   NEWS,   MEWS Score,   Method of Visit  Encounter Info: Billing Info,   History,   Allergies,   Detailed Report   All Notes   Progress Notes by Dillon Needs, NP at 07/26/2022 9:30 AM  Author: Sherran Needs, NP Author Type: Nurse Practitioner Filed: 07/26/2022 10:06 AM  Note Status: Addendum Cosign: Cosign Not Required Date of Service: 07/26/2022  9:30 AM  Editor: Dillon Needs, NP (Nurse Practitioner)      Prior Versions: 1. Dillon Needs, NP (Nurse Practitioner) at 07/26/2022 10:05 AM - Signed  Expand All Collapse All    Primary Care Physician: Dillon Lima, MD Referring Physician:Dr. Gwenlyn Young EP; Dr. Soundra Young Dillon Young is a 89 y.o. male with a h/o coronary artery disease status post angioplasty in 1994 and 1995 of his circumflex coronary artery. . He has a history of hypertension and hyperlipidemia on medication. He had right carotid endarterectomy performed by Dr. Curt Young June 2013.Myoview stress test performed 01/09/16 was notable for ischemia in the RCA distribution. Based on this he underwent cardiac catheterization by Dr. Irish Young 01/16/16 revealing a 90% segmental mid dominant RCA stenosis which was stented with a Synergy 3.5 mm x 20 mm long drug-eluting stent. He did have a very small ramus branch that was 90% stenosed and not suitable for intervention as well as a 70% mid LAD lesion although he had no evidence of anterior ischemia on his stress test. His symptoms of chest pain have resolved.    He was diagnosed in May 2019 with new   onset  afib from which the pt is asymptomatic. Echo was done and showed EF decreased to 30-35%, normal EF by echo in August 2017. He has been on Eliquis 5 mg bid since early May without missed doses.  Plavix were stopped at that time, ASA continued and pt was started on carvedilol 6.25 mg bid.He states that he had a sleep study in the past but never slept enough with the study to get good results. He is afraid that he would never be able to tolerate a cpap. Sleeps in a recliner now, " I just feel more comfortable".   F/u in afib clinic 10/03/17, unfortunately cardioversion only held for 1-2 days.He developed  exertional chest discomfort   and was seen by R. Barrett, PA and had a cardiac cath which pt did receive a stent in the LAD. He feels improved but still remains in afib.  He did have to hold anticoagulation for 1-2 days and was back on anticoagulation without interruption on 8/23.  We did discuss trying to restore SR with EF moderately reduced and he would like to purse after his vacation at the end of the month.   F/u in afib clinic, 01/31/18. He is back in afib clinic to discuss Tikosyn admit.  He has recently seen Dr. Aundra Young and he also encouraged pt to consider Tiksoyn for his LV dysfunction.Marland Kitchen He is feeling better and was  hoping he could get by without drug. He will commit but wants to wait until 10/1. He also reports that he has been having h/a's ever since he started Imdur. I discussed with Dillon Young, PharmD, and  Imdur was eventually stopped with resolution of H/A.   [name] is now back in afib clinic for tikosyn admit.  No misses eliquis doses and no benadryl use.PharmD has screened drugs and no qt prolonging drugs. He will be applying for drug assistance and papers received and reviewed.   F/u 10/28, s/p Tikosyn admit. He is in SR with 125 mcg of Tikosyn. He will be getting drug thru pharmacy  assistance program. He feels improved.   F/u Tikosyn 11/21, he continues in [place]. He has a sinus infection  and is taking cortisone and antibiotic currently. qtc 463 ms. By Dillon Young he appears to have bigeminal PVC's but rhythm was regular when auscultated.   F/u in  afib clinic, 07/26/22. He was sent her by Dr. Gwenlyn Young as he Young him in afib  2/21. He is rate controled and minimally symptomatic with fatigue . He continues on tikosyn 125 mcg bid. EKG today shows rate controlled afib.   Today, he denies symptoms of palpitations, shortness of breath, orthopnea, PND, lower extremity edema, dizziness, presyncope, syncope, or neurologic sequela.  The patient is tolerating medications without difficulties and is otherwise without complaint today.        Past Medical History:  Diagnosis Date   Adenomatous colon polyp     Allergic rhinitis     Anemia     Anxiety     Bladder cancer ([place])      currently on bcg treatments   BPH (benign prostatic hyperplasia)     CAD (coronary artery disease)      sees Dr. Quay Young    Carotid artery disease [facility])     CHF (congestive heart failure) ([place])     Chronic kidney disease      [place]. Prostate cancer   Clotting disorder ([facility])     CVA (cerebral infarction)     Diabetes mellitus      "Borderline"   Diverticulitis     Dysrhythmia      atrial fib - hx of    Edema      recent swelling in feet and ankles    GERD (gastroesophageal reflux disease)      history   Headache     Hemorrhoids     History of kidney stones     HTN (hypertension)     Hx of radiation therapy      for prostate - 2013    Hyperlipidemia LDL goal <70     IBS (irritable bowel syndrome)     Low back pain     Pre-diabetes     Prostate cancer ([place]) 2008    seed implantation   Sleep apnea      sitting in recliner helps him to breathe per pt , never used cpap sleep study - pt reports could not go to sleep- 6 years ago    Smoker     Stroke [facility])      hx of mini strokes years ago          Past Surgical History:  Procedure Laterality Date   APPENDECTOMY       CARDIAC CATHETERIZATION N/A  01/16/2016    Procedure: Left Heart Cath and Coronary Angiography;  Surgeon: Dillon Booze, MD;  Location: [place] [facility];  Service:  Cardiovascular;  Laterality: N/A;   CARDIAC CATHETERIZATION N/A 01/16/2016    Procedure: Coronary Stent Intervention;  Surgeon: Dillon Booze, MD;  Location: [place] [facility];  Service: Cardiovascular;  Laterality: N/A;   CARDIOVERSION N/A 11/26/2017    Procedure: CARDIOVERSION;  Surgeon: [name], MD;  Location: [facility] ENDOSCOPY;  Service: Cardiovascular;  Laterality: N/A;   CARDIOVERSION N/A 03/13/2018    Procedure: CARDIOVERSION;  Surgeon: [name], MD;  Location: [facility] ENDOSCOPY;  Service: Cardiovascular;  Laterality: N/A;   CATARACT EXTRACTION W/ INTRAOCULAR LENS  IMPLANT, BILATERAL Bilateral     COLONOSCOPY   10/21/2008    diverticulosis, radiation proctitis, internal hemorrhoids   CORONARY ANGIOPLASTY       CORONARY STENT INTERVENTION N/A 12/26/2017    Procedure: CORONARY STENT INTERVENTION;  Surgeon: [name], MD;  Location: [place] [facility];  Service: Cardiovascular;  Laterality: N/A;   ENDARTERECTOMY   11/12/2011    Procedure: ENDARTERECTOMY CAROTID;  Surgeon: Rosetta Posner, MD;  Location: [place];  Service: Vascular;  Laterality: Right;   ESOPHAGOGASTRODUODENOSCOPY (EGD) WITH ESOPHAGEAL DILATION       HEMORRHOID BANDING       LEFT HEART CATH AND CORONARY ANGIOGRAPHY N/A 12/26/2017    Procedure: LEFT HEART CATH AND CORONARY ANGIOGRAPHY;  Surgeon: [name], MD;  Location: [place] [facility];  Service: Cardiovascular;  Laterality: N/A;   LITHOTRIPSY   X 3   LUMBAR LAMINECTOMY       TRANSURETHRAL RESECTION OF BLADDER TUMOR WITH MITOMYCIN-C Bilateral 01/06/2021    Procedure: TRANSURETHRAL RESECTION OF BLADDER TUMOR WITH GEMCITABINE BILATERAL RETROGRADES;  Surgeon: Irine Seal, MD;  Location: [facility];  Service: Urology;  Laterality: Bilateral;   TRANSURETHRAL RESECTION OF PROSTATE   10/2003   TRANSURETHRAL RESECTION OF  PROSTATE N/A 05/31/2020    Procedure: CYSTOSCOPY TRANSURETHRAL RESECTION BIOPSY AND FULGURATION OF PROSTATE LESION  (TURP);  Surgeon: Irine Seal, MD;  Location: [facility];  Service: Urology;  Laterality: N/A;            Current Outpatient Medications  Medication Sig Dispense Refill   acetaminophen (TYLENOL) 500 MG tablet Take 500 mg by mouth as needed for mild pain or headache.       apixaban (ELIQUIS) 5 MG TABS tablet Take 1 tablet (5 mg total) by mouth 2 (two) times daily. 180 tablet 3   benzonatate (TESSALON) 100 MG capsule Take 1 capsule (100 mg total) by mouth 2 (two) times daily as needed for cough. 20 capsule 0   carvedilol (COREG) 3.125 MG tablet TAKE 1 TABLET BY MOUTH TWICE A DAY WITH FOOD 180 tablet 0   carvedilol (COREG) 6.25 MG tablet TAKE 1 & 1/2 BY MOUTH 2 TIMES DAILY WITH A MEAL. (Patient taking differently: Take 1 BY MOUTH 2 TIMES DAILY WITH A MEAL) 270 tablet 1   Cholecalciferol (VITAMIN D-3) 25 MCG (1000 UT) CAPS Take 1,000 Units by mouth daily.       Daridorexant HCl (QUVIVIQ) 25 MG TABS Take 1 tablet by mouth at bedtime. (Patient taking differently: Take 1 tablet by mouth as needed.) 90 tablet 1   dicyclomine (BENTYL) 10 MG capsule TAKE 1 CAPSULE (10 MG TOTAL) BY MOUTH EVERY 6 (SIX) HOURS AS NEEDED (ABDOMINAL PAIN). 30 capsule 1   dofetilide (TIKOSYN) 125 MCG capsule TAKE 1 CAPSULE BY MOUTH TWICE DAILY 180 capsule 3   fexofenadine (ALLEGRA) 180 MG tablet Take 180 mg by mouth daily as needed for allergies or rhinitis.  fluticasone (FLONASE) 50 MCG/ACT nasal spray Place 1 spray into both nostrils daily.       gabapentin (NEURONTIN) 300 MG capsule Take 1 capsule (300 mg total) by mouth 3 (three) times daily. 270 capsule 1   hydrALAZINE (APRESOLINE) 25 MG tablet Take 25 mg by mouth 3 (three) times daily. As need when BP is 160       hydrocortisone (ANUSOL-HC) 2.5 % rectal cream Place 1 application rectally 3 (three) times daily as needed for hemorrhoids. 30 g 1   Hypromellose  (ARTIFICIAL TEARS OP) Place 1 drop into both eyes 4 (four) times daily as needed (dry eyes).       ipratropium (ATROVENT) 0.06 % nasal spray Place 2 sprays into both nostrils 4 (four) times daily as needed for rhinitis. 90 mL 1   levocetirizine (XYZAL) 5 MG tablet Take 1 tablet (5 mg total) by mouth every evening. 90 tablet 1   nitroGLYCERIN (NITROSTAT) 0.4 MG SL tablet Place 1 tablet (0.4 mg total) under the tongue every 5 (five) minutes as needed for chest pain. 25 tablet 3   rosuvastatin (CRESTOR) 10 MG tablet TAKE 1 TABLET BY MOUTH EVERY DAY 90 tablet 1   sacubitril-valsartan (ENTRESTO) 97-103 MG Take 1 tablet by mouth 2 (two) times daily. 180 tablet 3   spironolactone (ALDACTONE) 25 MG tablet TAKE 2 TABLETS BY MOUTH EVERY DAY (Patient taking differently: Take 25 mg by mouth daily.) 180 tablet 3   Tiotropium Bromide Monohydrate (SPIRIVA RESPIMAT) 2.5 MCG/ACT AERS Inhale 2 puffs into the lungs daily. 4 g 11   triamcinolone cream (KENALOG) 0.1 % Apply 1 application topically daily as needed (to affected areas, for itching).        Wheat Dextrin (BENEFIBER DRINK MIX PO) Take 2 Scoops by mouth daily in the afternoon. Take 2 tablespoons daily        No current facility-administered medications for this encounter.           Allergies  Allergen Reactions   Lipitor [Atorvastatin] Other (See Comments)      Muscle pain   Other Rash and Other (See Comments)      Detergent- Rashes on the skin      Social History         Socioeconomic History   Marital status: Married      Spouse name: Not on file   Number of children: 2   Years of education: Not on file   Highest education level: Not on file  Occupational History   Occupation: [employer]  Tobacco Use   Smoking status: Former      Packs/day: 1.50      Years: 60.00      Total pack years: 90.00      Types: Cigarettes      Quit date: 07/25/2008      Years since quitting: 14.0   Smokeless tobacco: Never  Vaping Use   Vaping  Use: Never used  Substance and Sexual Activity   Alcohol use: Not Currently      Comment: occassionally   Drug use: No   Sexual activity: Not Currently  Other Topics Concern   Not on file  Social History Narrative    Married second time    Retired [place] yrs    2 daughters and 2 stepdaughters    Social Determinants of Health        Financial Resource Strain: Low Risk  (09/25/2021)    Overall Emergency planning/management officer Strain (  CARDIA)     Difficulty of Paying Living Expenses: Not hard at all  Food Insecurity: No Food Insecurity (09/25/2021)    Hunger Vital Sign     Worried About Running Out of Food in the Last Year: Never true     Ran Out of Food in the Last Year: Never true  Transportation Young: No Transportation Young (09/25/2021)    PRAPARE - Armed forces logistics/support/administrative officer (Medical): No     Young of Transportation (Non-Medical): No  Physical Activity: Sufficiently Active (09/25/2021)    Exercise Vital Sign     Days of Exercise per Week: 5 days     Minutes of Exercise per Session: 30 min  Stress: No Stress Concern Present (09/25/2021)    [place]     Feeling of Stress : Not at all  Social Connections: Socially Integrated (09/25/2021)    Social Connection and Isolation Panel [NHANES]     Frequency of Communication with Friends and Family: More than three times a week     Frequency of Social Gatherings with Friends and Family: More than three times a week     Attends Religious Services: More than 4 times per year     Active Member of Clubs or Organizations: Yes     Attends Archivist Meetings: More than 4 times per year     Marital Status: Married  Human resources officer Violence: Not At Risk (09/25/2021)    Humiliation, Afraid, Rape, and Kick questionnaire     Fear of Current or Ex-Partner: No     Emotionally Abused: No     Physically Abused: No     Sexually Abused: No           Family  History  Problem Relation Age of Onset   Heart disease Father     Hypertension Mother     Asthma Paternal Grandmother     Colon cancer Neg Hx     Esophageal cancer Neg Hx     Stomach cancer Neg Hx     Rectal cancer Neg Hx     Liver cancer Neg Hx        ROS- All systems are reviewed and negative except as per the HPI above   Physical Exam:    Vitals:    07/26/22 0920  Weight: 80.5 kg  Height: '5\' 9"'$  (1.753 m)       Wt Readings from Last 3 Encounters:  07/26/22 80.5 kg  07/18/22 81.5 kg  06/27/22 80.7 kg      Labs: Recent Labs       Lab Results  Component Value Date    NA 140 06/27/2022    K 4.6 06/27/2022    CL 102 06/27/2022    CO2 29 06/27/2022    GLUCOSE 121 (H) 06/27/2022    BUN 20 06/27/2022    CREATININE 1.20 06/27/2022    CALCIUM 9.7 06/27/2022    MG 2.1 04/17/2022      Recent Labs       Lab Results  Component Value Date    INR 1.2 02/10/2022      Recent Labs       Lab Results  Component Value Date    CHOL 129 10/18/2021    HDL 45 10/18/2021    LDLCALC 60 10/18/2021    TRIG 118 10/18/2021          GEN- The patient is well appearing, alert and  oriented x 3 today.   Head- normocephalic, atraumatic Eyes-  Sclera clear, conjunctiva pink Ears- hearing intact Oropharynx- clear Neck- supple, no JVP Lymph- no cervical lymphadenopathy Lungs- Clear to ausculation bilaterally, normal work of breathing Heart- irregular rate and rhythm, no murmurs, rubs or gallops, PMI not laterally displaced GI- soft, NT, ND, + BS Extremities- no clubbing, cyanosis, or edema MS- no significant deformity or atrophy Skin- no rash or lesion Psych- euthymic mood, full affect Neuro- strength and sensation are intact   EKG- Vent. rate 80 BPM PR interval * ms QRS duration 94 ms QT/QTcB 370/426 ms P-R-T axes * -46 -17 Atrial fibrillation with premature ventricular or aberrantly conducted complexes Left axis deviation Abnormal ECG When compared with ECG of  17-Apr-2022 09:12, PREVIOUS ECG IS PRESENT   Echo- 11/03/21- showed EF of 55 to 65%, mild LVH, left atrium mildly elevated     Assessment and Plan: 1. New onset afib May 2019 Has been maintained in SR on Tikosyn 125 mcg bid for many years  Recently Young to be in rate controlled afib, symptomatic with fatigue for a few weeks  Discussed pursing cardioversion and he is in agreement  Continue tikosyn 125 mcg bid and carvedilol 3.125 gm bid  Cbc/bmet/mag   2. CHA2DS2VASc  score of at least 5 Continue 5 mg bid  No missed doses x at least 3 weeks    3. CAD Stable   F/u in afib clinic one week after cardioversion    Butch Penny C. [facility] [address] [place], [place] [address] [phone]         For [place]; compliant with apixaban; no changes. Kirk Ruths

## 2022-07-31 NOTE — Procedures (Signed)
Electrical Cardioversion Procedure Note Dillon Young [id number] March 17, 1933  Procedure: Electrical Cardioversion Indications:  Atrial Fibrillation  Procedure Details Consent: Risks of procedure as well as the alternatives and risks of each were explained to the (patient/caregiver).  Consent for procedure obtained. Time Out: Verified patient identification, verified procedure, site/side was marked, verified correct patient position, special equipment/implants available, medications/allergies/relevent history reviewed, required imaging and test results available.  Performed  Patient placed on cardiac monitor, pulse oximetry, supplemental oxygen as necessary.  Sedation given:  Pt sedated by anesthesia with lidocaine 40 mg and diprovan 60 mg IV. Pacer pads placed anterior and posterior chest.  Cardioverted 1 time(s).  Cardioverted at [place].  Evaluation Findings: Post procedure EKG shows: NSR Complications: None Patient did tolerate procedure well.   Kirk Ruths 07/31/2022, 9:39 AM

## 2022-07-31 NOTE — Anesthesia Procedure Notes (Signed)
Procedure Name: General with mask airway Date/Time: 07/31/2022 10:15 AM  Performed by: Valda Favia, CRNAPre-anesthesia Checklist: Patient identified, Emergency Drugs available, Suction available, Patient being monitored and Timeout performed Patient Re-evaluated:Patient Re-evaluated prior to induction Oxygen Delivery Method: Ambu bag Preoxygenation: Pre-oxygenation with 100% oxygen Induction Type: IV induction Ventilation: Mask ventilation without difficulty Placement Confirmation: positive ETCO2 Dental Injury: Teeth and Oropharynx as per pre-operative assessment

## 2022-07-31 NOTE — Anesthesia Preprocedure Evaluation (Signed)
Anesthesia Evaluation  Patient identified by MRN, date of birth, ID band Patient awake    Reviewed: Allergy & Precautions, NPO status , Patient's Chart, lab work & pertinent test results, reviewed documented beta blocker date and time   History of Anesthesia Complications Negative for: history of anesthetic complications  Airway Mallampati: II  TM Distance: >3 FB Neck ROM: Full    Dental no notable dental hx. (+) Dental Advisory Given   Pulmonary sleep apnea , former smoker   Pulmonary exam normal        Cardiovascular hypertension, Pt. on medications and Pt. on home beta blockers + CAD, + Cardiac Stents and +CHF  + dysrhythmias Atrial Fibrillation  Rhythm:Regular Rate:Normal  Per cardiology preoperative evaluation 12/27/2020, "Chart reviewed as part of pre-operative protocol coverage. Patient was contacted8/2/2022in reference to pre-operative risk assessment for pending surgery as outlined below. Jude Skipton Towerywas last seen on 4/22/2022by Dr. [name]. Since that day, AZAVION BROMMER done well without chest pain or worsening dyspnea.He has been able to walk 30 min a day 5 days a week. He clearly is able to accomplish more than 4 METS of activity.  Please see recommendation by our clinical pharmacist regarding Eliquis.  Therefore, based on ACC/AHA guidelines, the patient would be at acceptable risk for the planned procedure without further cardiovascular testing."  IMPRESSIONS    1. Left ventricular ejection fraction, by estimation, is 50 to 55%. The  left ventricle has low normal function. The left ventricle demonstrates  regional wall motion abnormalities (see scoring diagram/findings for  description). There is mild hypokinesis  of the posterolateral LV wall.There is mild concentric left ventricular  hypertrophy. Diastolic function indeterminant due to arrhythmia.  2. Right ventricular systolic function is  normal. The right ventricular  size is normal.  3. Left atrial size was moderately dilated.  4. Right atrial size was mildly dilated.  5. The mitral valve is grossly normal. Mild mitral valve regurgitation.  6. The aortic valve is tricuspid. There is mild calcification of the  aortic valve. There is mild thickening of the aortic valve. Aortic valve  regurgitation is not visualized. Mild aortic valve sclerosis is present,  with no evidence of aortic valve  stenosis.  7. The inferior vena cava is normal in size with greater than 50%  respiratory variability, suggesting right atrial pressure of 3 mmHg.   Comparison(s): Compared to prior echo in 10/2019, there is no significant  change.    Neuro/Psych  Headaches  Anxiety     CVA, No Residual Symptoms    GI/Hepatic Neg liver ROS,GERD  Controlled,,  Endo/Other  diabetes    Renal/GU negative Renal ROS     Musculoskeletal  (+) Arthritis ,    Abdominal Normal abdominal exam  (+)   Peds  Hematology HLD   Anesthesia Other Findings   Reproductive/Obstetrics                             Anesthesia Physical Anesthesia Plan  ASA: 3  Anesthesia Plan: General   Post-op Pain Management:    Induction: Intravenous  PONV Risk Score and Plan: 3 and Treatment may vary due to age or medical condition  Airway Management Planned: Natural Airway and Simple Face Mask  Additional Equipment: None  Intra-op Plan:   Post-operative Plan:   Informed Consent: I have reviewed the patients History and Physical, chart, labs and discussed the procedure including the risks, benefits and alternatives for the  proposed anesthesia with the patient or authorized representative who has indicated his/her understanding and acceptance.       Plan Discussed with: CRNA  Anesthesia Plan Comments:         Anesthesia Quick Evaluation

## 2022-07-31 NOTE — Transfer of Care (Signed)
Immediate Anesthesia Transfer of Care Note  Patient: Dillon Young  Procedure(s) Performed: CARDIOVERSION  Patient Location: Endoscopy Unit  Anesthesia Type:General  Level of Consciousness: awake, alert , and oriented  Airway & Oxygen Therapy: Patient Spontanous Breathing  Post-op Assessment: Report given to RN and Post -op Vital signs reviewed and stable  Post vital signs: Reviewed and stable  Last Vitals:  Vitals Value Taken Time  BP    Temp    Pulse    Resp    SpO2      Last Pain:  Vitals:   07/31/22 0935  TempSrc: Tympanic  PainSc: 0-No pain         Complications: No notable events documented.

## 2022-07-31 NOTE — Interval H&P Note (Signed)
History and Physical Interval Note:  07/31/2022 9:40 AM  Dillon Young  has presented today for surgery, with the diagnosis of AFIB.  The various methods of treatment have been discussed with the patient and family. After consideration of risks, benefits and other options for treatment, the patient has consented to  Procedure(s): CARDIOVERSION (N/A) as a surgical intervention.  The patient's history has been reviewed, patient examined, no change in status, stable for surgery.  I have reviewed the patient's chart and labs.  Questions were answered to the patient's satisfaction.     Kirk Ruths

## 2022-07-31 NOTE — Anesthesia Postprocedure Evaluation (Signed)
Anesthesia Post Note  Patient: Dillon Young  Procedure(s) Performed: CARDIOVERSION     Patient location during evaluation: PACU Anesthesia Type: General Level of consciousness: awake Pain management: pain level controlled Vital Signs Assessment: post-procedure vital signs reviewed and stable Respiratory status: spontaneous breathing Cardiovascular status: stable Postop Assessment: no apparent nausea or vomiting Anesthetic complications: no   No notable events documented.  Last Vitals:  Vitals:   07/31/22 1030 07/31/22 1035  BP: (!) 98/57 (!) 110/55  Pulse: 60 (!) 54  Resp: 10 (!) 21  Temp:    SpO2: 97% 96%    Last Pain:  Vitals:   07/31/22 1035  TempSrc:   PainSc: 0-No pain                 Huston Foley

## 2022-08-01 DIAGNOSIS — C678 Malignant neoplasm of overlapping sites of bladder: Secondary | ICD-10-CM | POA: Diagnosis not present

## 2022-08-01 DIAGNOSIS — Z5111 Encounter for antineoplastic chemotherapy: Secondary | ICD-10-CM | POA: Diagnosis not present

## 2022-08-02 ENCOUNTER — Encounter (HOSPITAL_COMMUNITY): Payer: Self-pay | Admitting: Cardiology

## 2022-08-07 ENCOUNTER — Encounter (HOSPITAL_COMMUNITY): Payer: Self-pay | Admitting: Nurse Practitioner

## 2022-08-07 ENCOUNTER — Ambulatory Visit (HOSPITAL_COMMUNITY)
Admission: RE | Admit: 2022-08-07 | Discharge: 2022-08-07 | Disposition: A | Discharge status: Home / Self Care | Payer: PPO | Source: Ambulatory Visit | Attending: Nurse Practitioner | Admitting: Nurse Practitioner

## 2022-08-07 VITALS — BP 158/72 | HR 98 | Ht 69.0 in | Wt 178.0 lb

## 2022-08-07 DIAGNOSIS — I519 Heart disease, unspecified: Secondary | ICD-10-CM | POA: Diagnosis not present

## 2022-08-07 DIAGNOSIS — Z87891 Personal history of nicotine dependence: Secondary | ICD-10-CM | POA: Insufficient documentation

## 2022-08-07 DIAGNOSIS — Z7901 Long term (current) use of anticoagulants: Secondary | ICD-10-CM | POA: Insufficient documentation

## 2022-08-07 DIAGNOSIS — I2510 Atherosclerotic heart disease of native coronary artery without angina pectoris: Secondary | ICD-10-CM | POA: Insufficient documentation

## 2022-08-07 DIAGNOSIS — Z955 Presence of coronary angioplasty implant and graft: Secondary | ICD-10-CM | POA: Diagnosis not present

## 2022-08-07 DIAGNOSIS — I10 Essential (primary) hypertension: Secondary | ICD-10-CM | POA: Insufficient documentation

## 2022-08-07 DIAGNOSIS — I4819 Other persistent atrial fibrillation: Secondary | ICD-10-CM | POA: Insufficient documentation

## 2022-08-07 DIAGNOSIS — Z79899 Other long term (current) drug therapy: Secondary | ICD-10-CM | POA: Insufficient documentation

## 2022-08-07 DIAGNOSIS — J310 Chronic rhinitis: Secondary | ICD-10-CM

## 2022-08-07 DIAGNOSIS — D508 Other iron deficiency anemias: Secondary | ICD-10-CM | POA: Diagnosis not present

## 2022-08-07 DIAGNOSIS — J301 Allergic rhinitis due to pollen: Secondary | ICD-10-CM

## 2022-08-07 DIAGNOSIS — E785 Hyperlipidemia, unspecified: Secondary | ICD-10-CM | POA: Diagnosis not present

## 2022-08-07 MED ORDER — AMIODARONE HCL 200 MG PO TABS: ORAL_TABLET | ORAL | 1 refills | Status: DC

## 2022-08-07 MED ORDER — LEVOCETIRIZINE DIHYDROCHLORIDE 5 MG PO TABS: 5.0000 mg | ORAL_TABLET | ORAL | Status: DC | PRN

## 2022-08-07 MED ORDER — CARVEDILOL 6.25 MG PO TABS: ORAL_TABLET | ORAL | Status: DC

## 2022-08-07 MED ORDER — CARVEDILOL 3.125 MG PO TABS: ORAL_TABLET | ORAL | Status: DC

## 2022-08-07 NOTE — Progress Notes (Signed)
Primary Care Physician: Dillon Lima, MD Referring Physician:Dr. Gwenlyn Young AHF:Dr. [name] is a 89 y.o. male with a h/o coronary artery disease status post angioplasty in 1994 and 1995 of his circumflex coronary artery. . He has a history of hypertension and hyperlipidemia on medication. He had right carotid endarterectomy performed by Dr. Curt Young June 2013.Myoview stress test performed 01/09/16 was notable for ischemia in the RCA distribution. Based on this he underwent cardiac catheterization by Dr. Irish Young 01/16/16 revealing a 90% segmental mid dominant RCA stenosis which was stented with a Synergy 3.5 mm x 20 mm long drug-eluting stent. He did have a very small ramus branch that was 90% stenosed and not suitable for intervention as well as a 70% mid LAD lesion although he had no evidence of anterior ischemia on his stress test. His symptoms of chest pain have resolved.   He was diagnosed in May 2019 with new  onset  afib from which the pt is asymptomatic. Echo was done and showed EF decreased to 30-35%, normal EF by echo in August 2017. He has been on Eliquis 5 mg bid since early May without missed doses.  Plavix were stopped at that time, ASA continued and pt was started on carvedilol 6.25 mg bid.He states that he had a sleep study in the past but never slept enough with the study to get good results. He is afraid that he would never be able to tolerate a cpap. Sleeps in a recliner now, " I just feel more comfortable".  F/u in afib clinic 10/03/17, unfortunately cardioversion only held for 1-2 days.He developed  exertional chest discomfort   and was seen by R. Barrett, PA and had a cardiac cath which pt did receive a stent in the LAD. He feels improved but still remains in afib.  He did have to hold anticoagulation for 1-2 days and was back on anticoagulation without interruption on 8/23.  We did discuss trying to restore SR with EF moderately reduced and he would like to purse  after his vacation at the end of the month.  F/u in afib clinic, 01/31/18. He is back in afib clinic to discuss Tikosyn admit.  He has recently seen Dr. Aundra Young and he also encouraged pt to consider Tiksoyn for his LV dysfunction.Marland Kitchen He is feeling better and was hoping he could get by without drug. He will commit but wants to wait until 10/1. He also reports that he has been having h/a's ever since he started Imdur. I discussed with Dillon Young, Dillon Young, and  Imdur was eventually stopped with resolution of H/A.  [name] is now back in afib clinic for tikosyn admit.  No misses eliquis doses and no benadryl use.Dillon Young has screened drugs and no qt prolonging drugs. He will be applying for drug assistance and papers received and reviewed.  F/u 10/28, s/p Tikosyn admit. He is in SR with 125 mcg of Tikosyn. He will be getting drug thru pharmacy  assistance program. He feels improved.  F/u Tikosyn 11/21, he continues in [name]. He has a sinus infection and is taking cortisone and antibiotic currently. qtc 463 ms. By Dillon Young he appears to have bigeminal PVC's but rhythm was regular when auscultated.  F/u in  afib clinic, 07/26/22. He was sent her by Dr. Gwenlyn Young as he Young him in afib  2/21. He is rate controled and minimally symptomatic with fatigue . He continues on tikosyn 125 mcg bid. EKG today shows rate controlled afib.  F/u in afib clinic, 08/07/22. He returned to afib in 3 days after CV. He feels weak in afib. We discussed today  tikosyn seems to have lost its effectiveness to control afib and to wash out Tikosyn and load amiodarone with a repeat CV in one month if he wants to restore SR  and he is in agreement,  risk vrs benefit of drug explained  and he wants to proceed. Most recent TSH/cmet/CXR in Epic reviewed.   Today, he denies symptoms of palpitations, shortness of breath, orthopnea, PND, lower extremity edema, dizziness, presyncope, syncope, or neurologic sequela.  The patient is tolerating medications without  difficulties and is otherwise without complaint today.   Past Medical History:  Diagnosis Date   Adenomatous colon polyp    Allergic rhinitis    Anemia    Anxiety    Bladder cancer ([place])    currently on bcg treatments   BPH (benign prostatic hyperplasia)    CAD (coronary artery disease)    sees Dillon Young    Carotid artery disease [facility])    CHF (congestive heart failure) ([place])    Chronic kidney disease    Sones. Prostate cancer   Clotting disorder ([facility])    CVA (cerebral infarction)    Diabetes mellitus    "Borderline"   Diverticulitis    Dysrhythmia    atrial fib - hx of    Edema    recent swelling in feet and ankles    GERD (gastroesophageal reflux disease)    history   Headache    Hemorrhoids    History of kidney stones    HTN (hypertension)    Hx of radiation therapy    for prostate - 2013    Hyperlipidemia LDL goal <70    IBS (irritable bowel syndrome)    Low back pain    Pre-diabetes    Prostate cancer ([place]) 2008   seed implantation   Sleep apnea    sitting in recliner helps him to breathe per pt , never used cpap sleep study - pt reports could not go to sleep- 6 years ago    Smoker    Stroke [facility])    hx of mini strokes years ago    Past Surgical History:  Procedure Laterality Date   APPENDECTOMY     CARDIAC CATHETERIZATION N/A 01/16/2016   Procedure: Left Heart Cath and Coronary Angiography;  Surgeon: Dillon Booze, MD;  Location: [place] CV LAB;  Service: Cardiovascular;  Laterality: N/A;   CARDIAC CATHETERIZATION N/A 01/16/2016   Procedure: Coronary Stent Intervention;  Surgeon: Dillon Booze, MD;  Location: [place] CV LAB;  Service: Cardiovascular;  Laterality: N/A;   CARDIOVERSION N/A 11/26/2017   Procedure: CARDIOVERSION;  Surgeon: Dillon Casino, MD;  Location: [facility] ENDOSCOPY;  Service: Cardiovascular;  Laterality: N/A;   CARDIOVERSION N/A 03/13/2018   Procedure: CARDIOVERSION;  Surgeon: Dillon Casino, MD;  Location: [facility]  ENDOSCOPY;  Service: Cardiovascular;  Laterality: N/A;   CARDIOVERSION N/A 07/31/2022   Procedure: CARDIOVERSION;  Surgeon: Lelon Perla, MD;  Location: [facility] ENDOSCOPY;  Service: Cardiovascular;  Laterality: N/A;   CATARACT EXTRACTION W/ INTRAOCULAR LENS  IMPLANT, BILATERAL Bilateral    COLONOSCOPY  10/21/2008   diverticulosis, radiation proctitis, internal hemorrhoids   CORONARY ANGIOPLASTY     CORONARY STENT INTERVENTION N/A 12/26/2017   Procedure: CORONARY STENT INTERVENTION;  Surgeon: [name], MD;  Location: [place] CV LAB;  Service: Cardiovascular;  Laterality: N/A;   ENDARTERECTOMY  11/12/2011  Procedure: ENDARTERECTOMY CAROTID;  Surgeon: Rosetta Posner, MD;  Location: [place];  Service: Vascular;  Laterality: Right;   ESOPHAGOGASTRODUODENOSCOPY (EGD) WITH ESOPHAGEAL DILATION     HEMORRHOID BANDING     LEFT HEART CATH AND CORONARY ANGIOGRAPHY N/A 12/26/2017   Procedure: LEFT HEART CATH AND CORONARY ANGIOGRAPHY;  Surgeon: [name], MD;  Location: [place] CV LAB;  Service: Cardiovascular;  Laterality: N/A;   LITHOTRIPSY  X 3   LUMBAR LAMINECTOMY     TRANSURETHRAL RESECTION OF BLADDER TUMOR WITH MITOMYCIN-C Bilateral 01/06/2021   Procedure: TRANSURETHRAL RESECTION OF BLADDER TUMOR WITH GEMCITABINE BILATERAL RETROGRADES;  Surgeon: Irine Seal, MD;  Location: [facility];  Service: Urology;  Laterality: Bilateral;   TRANSURETHRAL RESECTION OF PROSTATE  10/2003   TRANSURETHRAL RESECTION OF PROSTATE N/A 05/31/2020   Procedure: CYSTOSCOPY TRANSURETHRAL RESECTION BIOPSY AND FULGURATION OF PROSTATE LESION  (TURP);  Surgeon: Irine Seal, MD;  Location: [facility];  Service: Urology;  Laterality: N/A;    Current Outpatient Medications  Medication Sig Dispense Refill   acetaminophen (TYLENOL) 500 MG tablet Take 500 mg by mouth daily as needed for mild pain or headache.     apixaban (ELIQUIS) 5 MG TABS tablet Take 1 tablet (5 mg total) by mouth 2 (two) times daily. 180 tablet 3    benzonatate (TESSALON) 100 MG capsule Take 1 capsule (100 mg total) by mouth 2 (two) times daily as needed for cough. 20 capsule 0   Cholecalciferol (VITAMIN D-3) 25 MCG (1000 UT) CAPS Take 1,000 Units by mouth daily.     Daridorexant HCl (QUVIVIQ) 25 MG TABS Take 1 tablet by mouth at bedtime. 90 tablet 1   dicyclomine (BENTYL) 10 MG capsule TAKE 1 CAPSULE (10 MG TOTAL) BY MOUTH EVERY 6 (SIX) HOURS AS NEEDED (ABDOMINAL PAIN). 30 capsule 1   dofetilide (TIKOSYN) 125 MCG capsule TAKE 1 CAPSULE BY MOUTH TWICE DAILY 180 capsule 3   fexofenadine (ALLEGRA) 180 MG tablet Take 180 mg by mouth daily as needed for allergies or rhinitis.      fluticasone (FLONASE) 50 MCG/ACT nasal spray Place 1 spray into both nostrils daily as needed for allergies.     gabapentin (NEURONTIN) 300 MG capsule Take 1 capsule (300 mg total) by mouth 3 (three) times daily. 270 capsule 1   hydrALAZINE (APRESOLINE) 25 MG tablet Take 25 mg by mouth 3 (three) times daily as needed (As need when BP is 160).     hydrocortisone (ANUSOL-HC) 2.5 % rectal cream Place 1 application rectally 3 (three) times daily as needed for hemorrhoids. 30 g 1   Hypromellose (ARTIFICIAL TEARS OP) Place 1 drop into both eyes 4 (four) times daily as needed (dry eyes).     ipratropium (ATROVENT) 0.06 % nasal spray Place 2 sprays into both nostrils 4 (four) times daily as needed for rhinitis. 90 mL 1   nitroGLYCERIN (NITROSTAT) 0.4 MG SL tablet Place 1 tablet (0.4 mg total) under the tongue every 5 (five) minutes as needed for chest pain. 25 tablet 3   potassium chloride (KLOR-CON M) 20 MEQ tablet Take one tablet by mouth once daily 30 tablet 6   rosuvastatin (CRESTOR) 10 MG tablet TAKE 1 TABLET BY MOUTH EVERY DAY 90 tablet 1   sacubitril-valsartan (ENTRESTO) 97-103 MG Take 1 tablet by mouth 2 (two) times daily. 180 tablet 3   triamcinolone cream (KENALOG) 0.1 % Apply 1 application topically daily as needed (to affected areas, for itching).      Wheat Dextrin  (BENEFIBER  DRINK MIX PO) Take 30 mLs by mouth daily in the afternoon.     carvedilol (COREG) 3.125 MG tablet Take 1 tablet by mouth twice daily     carvedilol (COREG) 6.25 MG tablet Take 1 tablet by mouth twice daily     levocetirizine (XYZAL) 5 MG tablet Take 1 tablet (5 mg total) by mouth as needed.     spironolactone (ALDACTONE) 25 MG tablet TAKE 2 TABLETS BY MOUTH EVERY DAY (Patient not taking: Reported on 08/07/2022) 180 tablet 3   No current facility-administered medications for this encounter.    Allergies  Allergen Reactions   Lipitor [Atorvastatin] Other (See Comments)    Muscle pain   Other Rash and Other (See Comments)    Detergent- Rashes on the skin    Social History   Socioeconomic History   Marital status: Married    Spouse name: Not on file   Number of children: 2   Years of education: Not on file   Highest education level: Not on file  Occupational History   Occupation:  auto auction  Tobacco Use   Smoking status: Former    Packs/day: 1.50    Years: 60.00    Total pack years: 90.00    Types: Cigarettes    Quit date: 07/25/2008    Years since quitting: 14.0   Smokeless tobacco: Never  Vaping Use   Vaping Use: Never used  Substance and Sexual Activity   Alcohol use: Not Currently    Comment: occassionally   Drug use: No   Sexual activity: Not Currently  Other Topics Concern   Not on file  Social History Narrative   Married second time   Retired [place] yrs   2 daughters and 2 stepdaughters   Social Determinants of Health   Financial Resource Strain: Low Risk  (09/25/2021)   Overall Financial Resource Strain (CARDIA)    Difficulty of Paying Living Expenses: Not hard at all  Food Insecurity: No Food Insecurity (09/25/2021)   Hunger Vital Sign    Worried About Running Out of Food in the Last Year: Never true    Bent in the Last Year: Never true  Transportation Needs: No Transportation Needs (09/25/2021)   PRAPARE  - Hydrologist (Medical): No    Young of Transportation (Non-Medical): No  Physical Activity: Sufficiently Active (09/25/2021)   Exercise Vital Sign    Days of Exercise per Week: 5 days    Minutes of Exercise per Session: 30 min  Stress: No Stress Concern Present (09/25/2021)   [place]    Feeling of Stress : Not at all  Social Connections: [place] (09/25/2021)   Social Connection and Isolation Panel [NHANES]    Frequency of Communication with Friends and Family: More than three times a week    Frequency of Social Gatherings with Friends and Family: More than three times a week    Attends Religious Services: More than 4 times per year    Active Member of [employer] or Organizations: Yes    Attends Archivist Meetings: More than 4 times per year    Marital Status: Married  Human resources officer Violence: Not At Risk (09/25/2021)   Humiliation, Afraid, Rape, and Kick questionnaire    Fear of Current or Ex-Partner: No    Emotionally Abused: No    Physically Abused: No    Sexually Abused: No  Family History  Problem Relation Age of Onset   Heart disease Father    Hypertension Mother    Asthma Paternal Grandmother    Colon cancer Neg Hx    Esophageal cancer Neg Hx    Stomach cancer Neg Hx    Rectal cancer Neg Hx    Liver cancer Neg Hx     ROS- All systems are reviewed and negative except as per the HPI above  Physical Exam: Vitals:   08/07/22 0955  Weight: 80.7 kg  Height: '5\' 9"'$  (1.753 m)   Wt Readings from Last 3 Encounters:  08/07/22 80.7 kg  07/26/22 80.5 kg  07/18/22 81.5 kg    Labs: Lab Results  Component Value Date   NA 140 07/26/2022   K 3.7 07/26/2022   CL 103 07/26/2022   CO2 25 07/26/2022   GLUCOSE 110 (H) 07/26/2022   BUN 12 07/26/2022   CREATININE 1.02 07/26/2022   CALCIUM 9.1 07/26/2022   MG 2.1 07/26/2022   Lab Results  Component Value Date    INR 1.2 02/10/2022   Lab Results  Component Value Date   CHOL 129 10/18/2021   HDL 45 10/18/2021   LDLCALC 60 10/18/2021   TRIG 118 10/18/2021     GEN- The patient is well appearing, alert and oriented x 3 today.   Head- normocephalic, atraumatic Eyes-  Sclera clear, conjunctiva pink Ears- hearing intact Oropharynx- clear Neck- supple, no JVP Lymph- no cervical lymphadenopathy Lungs- Clear to ausculation bilaterally, normal work of breathing Heart- irregular rate and rhythm, no murmurs, rubs or gallops, PMI not laterally displaced GI- soft, NT, ND, + BS Extremities- no clubbing, cyanosis, or edema MS- no significant deformity or atrophy Skin- no rash or lesion Psych- euthymic mood, full affect Neuro- strength and sensation are intact  EKG- Vent. rate 98 BPM PR interval * ms QRS duration 94 ms QT/QTcB 322/411 ms P-R-T axes * -64 40 Atrial fibrillation with premature ventricular or aberrantly conducted complexes Left axis deviation Nonspecific T wave abnormality Abnormal ECG When compared with ECG of 31-Jul-2022 10:17, PREVIOUS ECG IS PRESENT   Echo- 11/03/21- showed EF of 55 to 65%, mild LVH, left atrium mildly elevated   Assessment and Plan: 1. New onset afib May 2019 Has been maintained in SR on Tikosyn 125 mcg bid for many years and most recent CV did not hold SR for more than 3 days Young to be in rate controlled afib, symptomatic with fatigue  Cardioversion was successful but ERAF after 3 days Discussed washing out Tikosyn and starting amiodarone 200 mg bid x one month and then planning on CV again if needed  Risk vrs benefit of drug discussed in detail with wife and pt and he wants to proceed  Continue carvedilol 3.125 gm bid  He was started on K+ when on Tikosyn last week prior to CV, but will now stop it as K+ is ok for amiodarone at pervious 3.7   2. CHA2DS2VASc  score of at least 5 Continue 5 mg bid  No missed doses    3. CAD Stable  F/u in afib  clinic one week for EKG after amiodarone start   D'Lo. Jakob Kimberlin, [facility] [address] [place], [place] [address] [phone]

## 2022-08-07 NOTE — Patient Instructions (Addendum)
STOP Dofetilide Phyllis Ginger)     On Saturday March 16th -- Start Amiodarone '200mg'$  --Take 1 tablet twice a day until April 16th then reduce to '200mg'$  once a day WITH FOOD

## 2022-08-17 ENCOUNTER — Ambulatory Visit (HOSPITAL_COMMUNITY)
Admission: RE | Admit: 2022-08-17 | Discharge: 2022-08-17 | Disposition: A | Discharge status: Home / Self Care | Payer: PPO | Source: Ambulatory Visit | Attending: Physician Assistant | Admitting: Physician Assistant

## 2022-08-17 VITALS — HR 67

## 2022-08-17 DIAGNOSIS — I4819 Other persistent atrial fibrillation: Secondary | ICD-10-CM | POA: Diagnosis not present

## 2022-08-17 DIAGNOSIS — Z006 Encounter for examination for normal comparison and control in clinical research program: Secondary | ICD-10-CM

## 2022-08-17 MED ORDER — AMIODARONE HCL 200 MG PO TABS: 200.0000 mg | ORAL_TABLET | Freq: Every day | ORAL | 1 refills | Status: DC

## 2022-08-17 NOTE — Progress Notes (Signed)
Patient returns for ECG after starting amiodarone. ECG shows:  Coarse Afib, frequent PVCs Vent. rate 67 BPM PR interval * ms QRS duration 100 ms QT/QTcB 398/420 ms  Patient continues to feel fatigued in afib. Will arrange for DCCV after amiodarone loading. Follow up in AF clinic post DCCV.

## 2022-08-17 NOTE — Patient Instructions (Signed)
Decrease Amiodarone to 200mg  once a day on April 16th  Take lab orders to any [facility] between April 8th-11th - orders attached   Cardioversion scheduled for: Monday, April 15th   - Arrive at the [facility] and go to admitting at 8:15AM   - Do not eat or drink anything after midnight the night prior to your procedure.   - Take all your morning medication (except diabetic medications) with a sip of water prior to arrival.  - You will not be able to drive home after your procedure.    - Do NOT miss any doses of your blood thinner - if you should miss a dose please notify our office immediately.   - If you feel as if you go back into normal rhythm prior to scheduled cardioversion, please notify our office immediately.   If your procedure is canceled in the cardioversion suite you will be charged a cancellation fee.

## 2022-08-17 NOTE — Research (Signed)
[place] Informed Consent   Subject Name: Dillon Young  Subject met inclusion and exclusion criteria.  The informed consent form, study requirements and expectations were reviewed with the subject and questions and concerns were addressed prior to the signing of the consent form.  The subject verbalized understanding of the trial requirements.  The subject agreed to participate in the [facility] trial and signed the informed consent on 22/Mar/2024.  The informed consent was obtained prior to performance of any protocol-specific procedures for the subject.  A copy of the signed informed consent was given to the subject and a copy was placed in the subject's medical record.   Tori Milks Jahi Roza

## 2022-08-20 ENCOUNTER — Telehealth (HOSPITAL_COMMUNITY): Payer: Self-pay | Admitting: Cardiology

## 2022-08-20 NOTE — Telephone Encounter (Signed)
Pt called to check the status of referral for ENT No appt available in epic  Referral faxed to [facility] at [phone] Fax confirmation received

## 2022-08-21 NOTE — Telephone Encounter (Signed)
Per Representative with [facility] Pt was scheduled with [facility] for 08/2022 however pt cancelled 08/16/22  Will reach out to pt for appointment  at [facility]

## 2022-08-22 ENCOUNTER — Ambulatory Visit (HOSPITAL_COMMUNITY): Payer: PPO | Attending: Cardiology

## 2022-08-22 ENCOUNTER — Ambulatory Visit: Payer: PPO | Admitting: Internal Medicine

## 2022-08-22 DIAGNOSIS — I10 Essential (primary) hypertension: Secondary | ICD-10-CM

## 2022-08-22 DIAGNOSIS — I519 Heart disease, unspecified: Secondary | ICD-10-CM

## 2022-08-22 DIAGNOSIS — I2510 Atherosclerotic heart disease of native coronary artery without angina pectoris: Secondary | ICD-10-CM

## 2022-08-22 DIAGNOSIS — E785 Hyperlipidemia, unspecified: Secondary | ICD-10-CM | POA: Diagnosis not present

## 2022-08-22 DIAGNOSIS — I4819 Other persistent atrial fibrillation: Secondary | ICD-10-CM

## 2022-08-22 LAB — ECHOCARDIOGRAM COMPLETE
Area-P 1/2: 4.04 cm2
S' Lateral: 3.9 cm

## 2022-08-28 DIAGNOSIS — H26493 Other secondary cataract, bilateral: Secondary | ICD-10-CM | POA: Diagnosis not present

## 2022-08-28 DIAGNOSIS — H0100A Unspecified blepharitis right eye, upper and lower eyelids: Secondary | ICD-10-CM | POA: Diagnosis not present

## 2022-08-28 DIAGNOSIS — H0100B Unspecified blepharitis left eye, upper and lower eyelids: Secondary | ICD-10-CM | POA: Diagnosis not present

## 2022-08-28 DIAGNOSIS — H52203 Unspecified astigmatism, bilateral: Secondary | ICD-10-CM | POA: Diagnosis not present

## 2022-08-28 DIAGNOSIS — R7303 Prediabetes: Secondary | ICD-10-CM | POA: Diagnosis not present

## 2022-08-28 DIAGNOSIS — H02055 Trichiasis without entropian left lower eyelid: Secondary | ICD-10-CM | POA: Diagnosis not present

## 2022-08-28 DIAGNOSIS — H04123 Dry eye syndrome of bilateral lacrimal glands: Secondary | ICD-10-CM | POA: Diagnosis not present

## 2022-08-28 DIAGNOSIS — H524 Presbyopia: Secondary | ICD-10-CM | POA: Diagnosis not present

## 2022-08-28 LAB — HM DIABETES EYE EXAM

## 2022-08-29 ENCOUNTER — Ambulatory Visit: Payer: PPO | Admitting: Internal Medicine

## 2022-08-29 ENCOUNTER — Telehealth (HOSPITAL_COMMUNITY): Payer: Self-pay

## 2022-08-29 MED ORDER — AMIODARONE HCL 200 MG PO TABS: 200.0000 mg | ORAL_TABLET | Freq: Every day | ORAL | Status: DC

## 2022-08-29 NOTE — Telephone Encounter (Signed)
Patient called regarding Amiodarone medication. He is currently taking 200 mg BID. Patient states he is having constipation and soreness in his stomach along with swelling in his feet and ankles. His weight is the same. Patient is being treated for bladder cancer. Per Tilda Franco he would like for patient to decrease his Amiodarone to 200 mg once daily. He was told to contact us back if he has further concerns. Communicated with patient and he verbalized understanding.

## 2022-09-01 ENCOUNTER — Other Ambulatory Visit: Payer: Self-pay | Admitting: Pulmonary Disease

## 2022-09-01 ENCOUNTER — Other Ambulatory Visit: Payer: Self-pay | Admitting: Internal Medicine

## 2022-09-01 DIAGNOSIS — J439 Emphysema, unspecified: Secondary | ICD-10-CM

## 2022-09-01 DIAGNOSIS — I2510 Atherosclerotic heart disease of native coronary artery without angina pectoris: Secondary | ICD-10-CM

## 2022-09-01 DIAGNOSIS — I4819 Other persistent atrial fibrillation: Secondary | ICD-10-CM

## 2022-09-01 DIAGNOSIS — I10 Essential (primary) hypertension: Secondary | ICD-10-CM

## 2022-09-01 DIAGNOSIS — I519 Heart disease, unspecified: Secondary | ICD-10-CM

## 2022-09-03 ENCOUNTER — Other Ambulatory Visit (HOSPITAL_COMMUNITY): Payer: Self-pay | Admitting: Physician Assistant

## 2022-09-03 DIAGNOSIS — I4819 Other persistent atrial fibrillation: Secondary | ICD-10-CM | POA: Diagnosis not present

## 2022-09-04 LAB — BASIC METABOLIC PANEL
BUN/Creatinine Ratio: 16 (ref 10–24)
BUN: 23 mg/dL (ref 8–27)
CO2: 24 mmol/L (ref 20–29)
Calcium: 9.2 mg/dL (ref 8.6–10.2)
Chloride: 102 mmol/L (ref 96–106)
Creatinine, Ser: 1.4 mg/dL — ABNORMAL HIGH (ref 0.76–1.27)
Glucose: 170 mg/dL — ABNORMAL HIGH (ref 70–99)
Potassium: 4.3 mmol/L (ref 3.5–5.2)
Sodium: 139 mmol/L (ref 134–144)
eGFR: 48 mL/min/{1.73_m2} — ABNORMAL LOW (ref >59)

## 2022-09-04 LAB — CBC
Hematocrit: 45.2 % (ref 37.5–51.0)
Hemoglobin: 14.2 g/dL (ref 13.0–17.7)
MCH: 26.9 pg (ref 26.6–33.0)
MCHC: 31.4 g/dL — ABNORMAL LOW (ref 31.5–35.7)
MCV: 86 fL (ref 79–97)
Platelets: 139 10*3/uL — ABNORMAL LOW (ref 150–450)
RBC: 5.27 x10E6/uL (ref 4.14–5.80)
RDW: 13.1 % (ref 11.6–15.4)
WBC: 5.3 10*3/uL (ref 3.4–10.8)

## 2022-09-07 NOTE — Pre-Procedure Instructions (Signed)
Spoke with patient regarding cardioversion instructions for Monday 4/15.  Nothing to eat of drink after midnight Sunday night. Arrival time is 0800. You will need a responsible person to drive you home after procedure.   He takes Eliquis- he takes twice a day, he hasn't missed any doses, he will take his dose on Monday morning.

## 2022-09-08 ENCOUNTER — Other Ambulatory Visit: Payer: Self-pay | Admitting: Internal Medicine

## 2022-09-08 ENCOUNTER — Encounter: Payer: Self-pay | Admitting: Internal Medicine

## 2022-09-08 DIAGNOSIS — I2510 Atherosclerotic heart disease of native coronary artery without angina pectoris: Secondary | ICD-10-CM

## 2022-09-08 DIAGNOSIS — I10 Essential (primary) hypertension: Secondary | ICD-10-CM

## 2022-09-08 DIAGNOSIS — I4819 Other persistent atrial fibrillation: Secondary | ICD-10-CM

## 2022-09-08 DIAGNOSIS — I519 Heart disease, unspecified: Secondary | ICD-10-CM

## 2022-09-09 ENCOUNTER — Other Ambulatory Visit: Payer: Self-pay | Admitting: Internal Medicine

## 2022-09-10 ENCOUNTER — Other Ambulatory Visit: Payer: Self-pay

## 2022-09-10 ENCOUNTER — Encounter (HOSPITAL_COMMUNITY)
Admission: RE | Disposition: A | Discharge status: Home / Self Care | Payer: Self-pay | Source: Home / Self Care | Attending: Internal Medicine

## 2022-09-10 ENCOUNTER — Ambulatory Visit (HOSPITAL_BASED_OUTPATIENT_CLINIC_OR_DEPARTMENT_OTHER): Payer: PPO | Admitting: Certified Registered Nurse Anesthetist

## 2022-09-10 ENCOUNTER — Encounter (HOSPITAL_COMMUNITY): Payer: Self-pay | Admitting: Internal Medicine

## 2022-09-10 ENCOUNTER — Ambulatory Visit (HOSPITAL_COMMUNITY): Payer: PPO | Admitting: Certified Registered Nurse Anesthetist

## 2022-09-10 ENCOUNTER — Ambulatory Visit (HOSPITAL_COMMUNITY)
Admission: RE | Admit: 2022-09-10 | Discharge: 2022-09-10 | Disposition: A | Discharge status: Home / Self Care | Payer: PPO | Attending: Internal Medicine | Admitting: Internal Medicine

## 2022-09-10 ENCOUNTER — Other Ambulatory Visit: Payer: Self-pay | Admitting: Internal Medicine

## 2022-09-10 DIAGNOSIS — I4819 Other persistent atrial fibrillation: Secondary | ICD-10-CM

## 2022-09-10 DIAGNOSIS — Z7901 Long term (current) use of anticoagulants: Secondary | ICD-10-CM | POA: Diagnosis not present

## 2022-09-10 DIAGNOSIS — I509 Heart failure, unspecified: Secondary | ICD-10-CM | POA: Diagnosis not present

## 2022-09-10 DIAGNOSIS — K219 Gastro-esophageal reflux disease without esophagitis: Secondary | ICD-10-CM | POA: Diagnosis not present

## 2022-09-10 DIAGNOSIS — I2510 Atherosclerotic heart disease of native coronary artery without angina pectoris: Secondary | ICD-10-CM | POA: Insufficient documentation

## 2022-09-10 DIAGNOSIS — E119 Type 2 diabetes mellitus without complications: Secondary | ICD-10-CM | POA: Diagnosis not present

## 2022-09-10 DIAGNOSIS — E785 Hyperlipidemia, unspecified: Secondary | ICD-10-CM | POA: Insufficient documentation

## 2022-09-10 DIAGNOSIS — I4891 Unspecified atrial fibrillation: Secondary | ICD-10-CM | POA: Insufficient documentation

## 2022-09-10 DIAGNOSIS — Z955 Presence of coronary angioplasty implant and graft: Secondary | ICD-10-CM | POA: Diagnosis not present

## 2022-09-10 DIAGNOSIS — I110 Hypertensive heart disease with heart failure: Secondary | ICD-10-CM | POA: Diagnosis not present

## 2022-09-10 DIAGNOSIS — I639 Cerebral infarction, unspecified: Secondary | ICD-10-CM

## 2022-09-10 DIAGNOSIS — Z87891 Personal history of nicotine dependence: Secondary | ICD-10-CM | POA: Diagnosis not present

## 2022-09-10 DIAGNOSIS — I10 Essential (primary) hypertension: Secondary | ICD-10-CM

## 2022-09-10 DIAGNOSIS — I519 Heart disease, unspecified: Secondary | ICD-10-CM

## 2022-09-10 HISTORY — PX: CARDIOVERSION: SHX1299

## 2022-09-10 LAB — GLUCOSE, CAPILLARY: Glucose-Capillary: 122 mg/dL — ABNORMAL HIGH (ref 70–99)

## 2022-09-10 SURGERY — CARDIOVERSION
Anesthesia: General

## 2022-09-10 MED ORDER — CARVEDILOL 6.25 MG PO TABS: 6.2500 mg | ORAL_TABLET | Freq: Two times a day (BID) | ORAL | 0 refills | Status: DC

## 2022-09-10 MED ORDER — SODIUM CHLORIDE 0.9 % IV SOLN: INTRAVENOUS | Status: DC

## 2022-09-10 MED ORDER — LIDOCAINE 2% (20 MG/ML) 5 ML SYRINGE
INTRAMUSCULAR | Status: DC | PRN
  Administered 2022-09-10: 60 mg via INTRAVENOUS

## 2022-09-10 MED ORDER — PROPOFOL 10 MG/ML IV BOLUS
INTRAVENOUS | Status: DC | PRN
  Administered 2022-09-10: 80 mg via INTRAVENOUS

## 2022-09-10 MED ORDER — CARVEDILOL 3.125 MG PO TABS: 3.1250 mg | ORAL_TABLET | Freq: Two times a day (BID) | ORAL | 0 refills | Status: DC

## 2022-09-10 SURGICAL SUPPLY — 1 items: ELECT DEFIB PAD ADLT CADENCE (PAD) ×1 IMPLANT

## 2022-09-10 NOTE — CV Procedure (Signed)
Shared Decision Making/Informed Consent The risks (stroke, cardiac arrhythmias rarely resulting in the need for a temporary or permanent pacemaker, skin irritation or burns and complications associated with conscious sedation including aspiration, arrhythmia, respiratory failure and death), benefits (restoration of normal sinus rhythm) and alternatives of a direct current cardioversion were explained in detail to Dillon Young and he agrees to proceed.    Patient sedated by anesthesia with 80 mg  propofol and 60 mg lidocaine intravenously  With pads in the AP position, patient cardioverted to SR with 200 J synchronized biphasic energy  Procedure was without complication.  12 lead EKG pending  Dillon Pates MD

## 2022-09-10 NOTE — Anesthesia Postprocedure Evaluation (Signed)
Anesthesia Post Note  Patient: Dillon Young  Procedure(s) Performed: CARDIOVERSION     Patient location during evaluation: PACU Anesthesia Type: General Level of consciousness: awake and alert Pain management: pain level controlled Vital Signs Assessment: post-procedure vital signs reviewed and stable Respiratory status: spontaneous breathing, nonlabored ventilation and respiratory function stable Cardiovascular status: blood pressure returned to baseline and stable Postop Assessment: no apparent nausea or vomiting Anesthetic complications: no   No notable events documented.  Last Vitals:  Vitals:   09/10/22 0929 09/10/22 0931  BP:  104/64  Pulse:  (!) 51  Resp:  14  Temp: 36.7 C   SpO2:  97%    Last Pain:  Vitals:   09/10/22 0929  TempSrc: Temporal  PainSc: 0-No pain                 Lowella Curb

## 2022-09-10 NOTE — Transfer of Care (Signed)
Immediate Anesthesia Transfer of Care Note  Patient: Dillon Young  Procedure(s) Performed: CARDIOVERSION  Patient Location: PACU and Cath Lab  Anesthesia Type:MAC  Level of Consciousness: patient cooperative and responds to stimulation  Airway & Oxygen Therapy: Patient Spontanous Breathing and Patient connected to nasal cannula oxygen  Post-op Assessment: Report given to RN and Post -op Vital signs reviewed and stable  Post vital signs: Reviewed and stable  Last Vitals:  Vitals Value Taken Time  BP 128/74 09/10/22 0918  Temp    Pulse 52 09/10/22 0922  Resp 16 09/10/22 0922  SpO2 90 % 09/10/22 0922    Last Pain:  Vitals:   09/10/22 0815  TempSrc: Temporal  PainSc: 0-No pain         Complications: No notable events documented.

## 2022-09-10 NOTE — H&P (Signed)
Primary Care Physician: Etta Grandchild, MD Referring Physician:Dr. Allyson Sabal AHF:Dr. Maximiano Coss Dillon Young is a 89 y.o. male with a h/o CAD, HTN, HL, CV dz and atrial fibrillation.    Afib Dx 2019  LVEF 30 to 35% (down from normal in 2017)   Placed on anticoagulation   Cardioverted in May 2019  Reverted to afib in 1 to 2 days   Cath showed patent coronary arteries.   Admitted in Oct 2019 for Tikosyn. Maintained SR for awhile   IN clinc on Feb 2024 found to be back in afib   Underwent cardioversion   Back in afib after 3 days    Tikosyn stopped   After wash out started on amiodarone    Now presents for cardioversion      PT has not missed any doses of amio or Eliquis          Past Medical History:  Diagnosis Date   Adenomatous colon polyp    Allergic rhinitis    Anemia    Anxiety    Bladder cancer    currently on bcg treatments   BPH (benign prostatic hyperplasia)    CAD (coronary artery disease)    sees Dr. Nanetta Batty    Carotid artery disease    CHF (congestive heart failure)    Chronic kidney disease    Sones. Prostate cancer   Clotting disorder    CVA (cerebral infarction)    Diabetes mellitus    "Borderline"   Diverticulitis    Dysrhythmia    atrial fib - hx of    Edema    recent swelling in feet and ankles    GERD (gastroesophageal reflux disease)    history   Headache    Hemorrhoids    History of kidney stones    HTN (hypertension)    Hx of radiation therapy    for prostate - 2013    Hyperlipidemia LDL goal <70    IBS (irritable bowel syndrome)    Low back pain    Pre-diabetes    Prostate cancer 2008   seed implantation   Sleep apnea    sitting in recliner helps him to breathe per pt , never used cpap sleep study - pt reports could not go to sleep- 6 years ago    Smoker    Stroke    hx of mini strokes years ago    Past Surgical History:  Procedure Laterality Date   APPENDECTOMY     CARDIAC CATHETERIZATION N/A 01/16/2016   Procedure: Left Heart  Cath and Coronary Angiography;  Surgeon: Corky Crafts, MD;  Location: [facility] INVASIVE CV LAB;  Service: Cardiovascular;  Laterality: N/A;   CARDIAC CATHETERIZATION N/A 01/16/2016   Procedure: Coronary Stent Intervention;  Surgeon: Corky Crafts, MD;  Location: [facility] INVASIVE CV LAB;  Service: Cardiovascular;  Laterality: N/A;   CARDIOVERSION N/A 11/26/2017   Procedure: CARDIOVERSION;  Surgeon: Chrystie Nose, MD;  Location: [facility] ENDOSCOPY;  Service: Cardiovascular;  Laterality: N/A;   CARDIOVERSION N/A 03/13/2018   Procedure: CARDIOVERSION;  Surgeon: Chrystie Nose, MD;  Location: [facility] ENDOSCOPY;  Service: Cardiovascular;  Laterality: N/A;   CARDIOVERSION N/A 07/31/2022   Procedure: CARDIOVERSION;  Surgeon: Lewayne Bunting, MD;  Location: [facility] ENDOSCOPY;  Service: Cardiovascular;  Laterality: N/A;   CATARACT EXTRACTION W/ INTRAOCULAR LENS  IMPLANT, BILATERAL Bilateral    COLONOSCOPY  10/21/2008   diverticulosis, radiation proctitis, internal hemorrhoids   CORONARY ANGIOPLASTY  CORONARY STENT INTERVENTION N/A 12/26/2017   Procedure: CORONARY STENT INTERVENTION;  Surgeon: Runell Gess, MD;  Location: [facility] INVASIVE CV LAB;  Service: Cardiovascular;  Laterality: N/A;   ENDARTERECTOMY  11/12/2011   Procedure: ENDARTERECTOMY CAROTID;  Surgeon: Larina Earthly, MD;  Location: [facility] OR;  Service: Vascular;  Laterality: Right;   ESOPHAGOGASTRODUODENOSCOPY (EGD) WITH ESOPHAGEAL DILATION     HEMORRHOID BANDING     LEFT HEART CATH AND CORONARY ANGIOGRAPHY N/A 12/26/2017   Procedure: LEFT HEART CATH AND CORONARY ANGIOGRAPHY;  Surgeon: Runell Gess, MD;  Location: [facility] INVASIVE CV LAB;  Service: Cardiovascular;  Laterality: N/A;   LITHOTRIPSY  X 3   LUMBAR LAMINECTOMY     TRANSURETHRAL RESECTION OF BLADDER TUMOR WITH MITOMYCIN-C Bilateral 01/06/2021   Procedure: TRANSURETHRAL RESECTION OF BLADDER TUMOR WITH GEMCITABINE BILATERAL RETROGRADES;  Surgeon: Bjorn Pippin, MD;  Location: [facility] ORS;  Service: Urology;   Laterality: Bilateral;   TRANSURETHRAL RESECTION OF PROSTATE  10/2003   TRANSURETHRAL RESECTION OF PROSTATE N/A 05/31/2020   Procedure: CYSTOSCOPY TRANSURETHRAL RESECTION BIOPSY AND FULGURATION OF PROSTATE LESION  (TURP);  Surgeon: Bjorn Pippin, MD;  Location: [facility] ORS;  Service: Urology;  Laterality: N/A;    Current Facility-Administered Medications  Medication Dose Route Frequency Provider Last Rate Last Admin   0.9 %  sodium chloride infusion   Intravenous Continuous Pricilla Riffle, MD 20 mL/hr at 09/10/22 0840 New Bag at 09/10/22 0840    Allergies  Allergen Reactions   Lipitor [Atorvastatin] Other (See Comments)    Muscle pain   Other Rash and Other (See Comments)    Detergent- Rashes on the skin    Social History   Socioeconomic History   Marital status: Married    Spouse name: Not on file   Number of children: 2   Years of education: Not on file   Highest education level: Not on file  Occupational History   Occupation: [employer]  Tobacco Use   Smoking status: Former    Packs/day: 1.50    Years: 60.00    Additional pack years: 0.00    Total pack years: 90.00    Types: Cigarettes    Quit date: 07/25/2008    Years since quitting: 14.1   Smokeless tobacco: Never  Vaping Use   Vaping Use: Never used  Substance and Sexual Activity   Alcohol use: Not Currently    Comment: occassionally   Drug use: No   Sexual activity: Not Currently  Other Topics Concern   Not on file  Social History Narrative   Married second time   Retired [employer] n41 yrs   2 daughters and 2 stepdaughters   Social Determinants of Health   Financial Resource Strain: Low Risk  (09/25/2021)   Overall Financial Resource Strain (CARDIA)    Difficulty of Paying Living Expenses: Not hard at all  Food Insecurity: No Food Insecurity (09/25/2021)   Hunger Vital Sign    Worried About Running Out of Food in the Last Year: Never true    Ran Out of Food in the Last Year: Never true   Transportation Needs: No Transportation Needs (09/25/2021)   PRAPARE - Administrator, Civil Service (Medical): No    Lack of Transportation (Non-Medical): No  Physical Activity: Sufficiently Active (09/25/2021)   Exercise Vital Sign    Days of Exercise per Week: 5 days    Minutes of Exercise per Session: 30 min  Stress: No Stress Concern Present (09/25/2021)  [employer] of Occupational Health - Occupational Stress Questionnaire    Feeling of Stress : Not at all  Social Connections: Socially Integrated (09/25/2021)   Social Connection and Isolation Panel [NHANES]    Frequency of Communication with Friends and Family: More than three times a week    Frequency of Social Gatherings with Friends and Family: More than three times a week    Attends Religious Services: More than 4 times per year    Active Member of Clubs or Organizations: Yes    Attends Banker Meetings: More than 4 times per year    Marital Status: Married  Catering manager Violence: Not At Risk (09/25/2021)   Humiliation, Afraid, Rape, and Kick questionnaire    Fear of Current or Ex-Partner: No    Emotionally Abused: No    Physically Abused: No    Sexually Abused: No    Family History  Problem Relation Age of Onset   Heart disease Father    Hypertension Mother    Asthma Paternal Grandmother    Colon cancer Neg Hx    Esophageal cancer Neg Hx    Stomach cancer Neg Hx    Rectal cancer Neg Hx    Liver cancer Neg Hx     ROS- All systems are reviewed and negative except as per the HPI above  Physical Exam: Vitals:   09/10/22 0815  BP: (!) 164/66  Pulse: 80  Resp: 13  Temp: 97.8 F (36.6 C)  TempSrc: Temporal  SpO2: 94%   Wt Readings from Last 3 Encounters:  08/07/22 80.7 kg  07/26/22 80.5 kg  07/18/22 81.5 kg    Labs: Lab Results  Component Value Date   NA 139 09/03/2022   K 4.3 09/03/2022   CL 102 09/03/2022   CO2 24 09/03/2022   GLUCOSE 170 (H) 09/03/2022   BUN 23  09/03/2022   CREATININE 1.40 (H) 09/03/2022   CALCIUM 9.2 09/03/2022   MG 2.1 07/26/2022   Lab Results  Component Value Date   INR 1.2 02/10/2022   Lab Results  Component Value Date   CHOL 129 10/18/2021   HDL 45 10/18/2021   LDLCALC 60 10/18/2021   TRIG 118 10/18/2021    Exam:  GEN- The patient is well appearing, alert and oriented x 3 today.   Head- normocephalic, atraumatic Neck- supple, no JVP Lungs- Clear to ausculation bilaterally,  Heart- irregular rate and rhythm, no murmurs Extremities- no clubbing, cyanosis, or edema  Assessment and Plan: 1. Afib    Has been maintained in SR on Tikosyn 125 mcg bid for many years and most recent CV did not hold SR for more than 3 days Found to be in rate controlled afib, symptomatic with fatigue  Cardioversion was successful but ERAF after 3 days Patinet has now been loaded with amiodarone    Presents for DCCV.   2. CHA2DS2VASc  score of at least 5 Continue 5 mg bid  No missed doses    3. CAD No symptoms of angina     Elvina Sidle. [facility] [address] [place], [place] [address] [phone]

## 2022-09-10 NOTE — Anesthesia Preprocedure Evaluation (Signed)
Anesthesia Evaluation  Patient identified by MRN, date of birth, ID band Patient awake    Reviewed: Allergy & Precautions, NPO status , Patient's Chart, lab work & pertinent test results, reviewed documented beta blocker date and time   History of Anesthesia Complications Negative for: history of anesthetic complications  Airway Mallampati: II  TM Distance: >3 FB Neck ROM: Full    Dental no notable dental hx. (+) Dental Advisory Given   Pulmonary sleep apnea , former smoker   Pulmonary exam normal        Cardiovascular hypertension, Pt. on medications and Pt. on home beta blockers + CAD, + Cardiac Stents and +CHF  + dysrhythmias Atrial Fibrillation  Rhythm:Regular Rate:Normal  Per cardiology preoperative evaluation 12/27/2020, "Chart reviewed as part of pre-operative protocol coverage. Patient was contacted8/2/2022in reference to pre-operative risk assessment for pending surgery as outlined below. Jayvone Diane Towerywas last seen on 4/22/2022by Dr. Shirlee Latch. Since that day, RODRIGUS NAFUS done well without chest pain or worsening dyspnea.He has been able to walk 30 min a day 5 days a week. He clearly is able to accomplish more than 4 METS of activity.  Please see recommendation by our clinical pharmacist regarding Eliquis.  Therefore, based on ACC/AHA guidelines, the patient would be at acceptable risk for the planned procedure without further cardiovascular testing."  IMPRESSIONS    1. Left ventricular ejection fraction, by estimation, is 50 to 55%. The  left ventricle has low normal function. The left ventricle demonstrates  regional wall motion abnormalities (see scoring diagram/findings for  description). There is mild hypokinesis  of the posterolateral LV wall.There is mild concentric left ventricular  hypertrophy. Diastolic function indeterminant due to arrhythmia.  2. Right ventricular systolic function is  normal. The right ventricular  size is normal.  3. Left atrial size was moderately dilated.  4. Right atrial size was mildly dilated.  5. The mitral valve is grossly normal. Mild mitral valve regurgitation.  6. The aortic valve is tricuspid. There is mild calcification of the  aortic valve. There is mild thickening of the aortic valve. Aortic valve  regurgitation is not visualized. Mild aortic valve sclerosis is present,  with no evidence of aortic valve  stenosis.  7. The inferior vena cava is normal in size with greater than 50%  respiratory variability, suggesting right atrial pressure of 3 mmHg.   Comparison(s): Compared to prior echo in 10/2019, there is no significant  change.    Neuro/Psych  Headaches  Anxiety     CVA, No Residual Symptoms    GI/Hepatic Neg liver ROS,GERD  Controlled,,  Endo/Other  diabetes    Renal/GU negative Renal ROS     Musculoskeletal  (+) Arthritis ,    Abdominal Normal abdominal exam  (+)   Peds  Hematology HLD   Anesthesia Other Findings   Reproductive/Obstetrics                             Anesthesia Physical Anesthesia Plan  ASA: 3  Anesthesia Plan: General   Post-op Pain Management:    Induction: Intravenous  PONV Risk Score and Plan: 2 and Treatment may vary due to age or medical condition and Propofol infusion  Airway Management Planned: Mask  Additional Equipment: None  Intra-op Plan:   Post-operative Plan:   Informed Consent: I have reviewed the patients History and Physical, chart, labs and discussed the procedure including the risks, benefits and alternatives for the proposed anesthesia  with the patient or authorized representative who has indicated his/her understanding and acceptance.       Plan Discussed with: CRNA  Anesthesia Plan Comments:         Anesthesia Quick Evaluation

## 2022-09-10 NOTE — Telephone Encounter (Signed)
Sent a message to the referral coordinator to see if she can schedule him an appt and let me know the information or reach back out to him.

## 2022-09-10 NOTE — Progress Notes (Signed)
Post cardioversion HR in 40s to 60 bpm Patient advised to take carvedilol 3.125 mg daily     Keep on amiodarone   WIll make sure he has appt soon in afib clinic to assess HR and BP  Dietrich Pates MD

## 2022-09-10 NOTE — Discharge Instructions (Signed)

## 2022-09-11 ENCOUNTER — Encounter (HOSPITAL_COMMUNITY): Payer: Self-pay | Admitting: Internal Medicine

## 2022-09-12 ENCOUNTER — Ambulatory Visit (HOSPITAL_COMMUNITY)
Admit: 2022-09-12 | Discharge: 2022-09-12 | Disposition: A | Discharge status: Home / Self Care | Payer: PPO | Attending: Physician Assistant | Admitting: Physician Assistant

## 2022-09-12 VITALS — BP 172/62 | HR 44 | Ht 69.0 in | Wt 183.0 lb

## 2022-09-12 DIAGNOSIS — Z7901 Long term (current) use of anticoagulants: Secondary | ICD-10-CM | POA: Insufficient documentation

## 2022-09-12 DIAGNOSIS — D6869 Other thrombophilia: Secondary | ICD-10-CM

## 2022-09-12 DIAGNOSIS — I2510 Atherosclerotic heart disease of native coronary artery without angina pectoris: Secondary | ICD-10-CM | POA: Insufficient documentation

## 2022-09-12 DIAGNOSIS — I4821 Permanent atrial fibrillation: Secondary | ICD-10-CM | POA: Insufficient documentation

## 2022-09-12 DIAGNOSIS — I4819 Other persistent atrial fibrillation: Secondary | ICD-10-CM | POA: Diagnosis not present

## 2022-09-12 DIAGNOSIS — I4891 Unspecified atrial fibrillation: Secondary | ICD-10-CM | POA: Diagnosis not present

## 2022-09-12 NOTE — Patient Instructions (Signed)
STOP coreg    Decrease amiodarone to 200mg  ONCE a day

## 2022-09-12 NOTE — Progress Notes (Signed)
Primary Care Physician: Dillon Lima, MD Referring Physician:Dr. Gwenlyn Young AHF:Dr. [name] is a 89 y.o. male with a h/o coronary artery disease status post angioplasty in 1994 and 1995 of his circumflex coronary artery. . He has a history of hypertension and hyperlipidemia on medication. He had right carotid endarterectomy performed by Dr. Curt Young June 2013.Myoview stress test performed 01/09/16 was notable for ischemia in the RCA distribution. Based on this he underwent cardiac catheterization by Dr. Irish Young 01/16/16 revealing a 90% segmental mid dominant RCA stenosis which was stented with a Synergy 3.5 mm x 20 mm long drug-eluting stent. He did have a very small ramus branch that was 90% stenosed and not suitable for intervention as well as a 70% mid LAD lesion although he had no evidence of anterior ischemia on his stress test. His symptoms of chest pain have resolved.   He was diagnosed in May 2019 with new  onset  afib from which the pt is asymptomatic. Echo was done and showed EF decreased to 30-35%, normal EF by echo in August 2017. He has been on Eliquis 5 mg bid since early May without missed doses.  Plavix were stopped at that time, ASA continued and pt was started on carvedilol 6.25 mg bid.He states that he had a sleep study in the past but never slept enough with the study to get good results. He is afraid that he would never be able to tolerate a cpap. Sleeps in a recliner now, " I just feel more comfortable".  F/u in afib clinic 10/03/17, unfortunately cardioversion only held for 1-2 days.He developed  exertional chest discomfort   and was seen by R. Barrett, PA and had a cardiac cath which pt did receive a stent in the LAD. He feels improved but still remains in afib.  He did have to hold anticoagulation for 1-2 days and was back on anticoagulation without interruption on 8/23.  We did discuss trying to restore SR with EF moderately reduced and he would like to purse  after his vacation at the end of the month.  F/u in afib clinic, 01/31/18. He is back in afib clinic to discuss Tikosyn admit.  He has recently seen Dr. Aundra Young and he also encouraged pt to consider Tiksoyn for his LV dysfunction.Dillon Kitchen He is feeling better and was hoping he could get by without drug. He will commit but wants to wait until 10/1. He also reports that he has been having h/a's ever since he started Imdur. I discussed with Dillon Young, Dillon Young, and  Imdur was eventually stopped with resolution of H/A.  Mr. Young is now back in afib clinic for tikosyn admit.  No misses eliquis doses and no benadryl use.Dillon Young has screened drugs and no qt prolonging drugs. He will be applying for drug assistance and papers received and reviewed.  F/u 10/28, s/p Tikosyn admit. He is in SR with 125 mcg of Tikosyn. He will be getting drug thru pharmacy  assistance program. He feels improved.  F/u Tikosyn 11/21, he continues in [name]. He has a sinus infection and is taking cortisone and antibiotic currently. qtc 463 ms. By Dillon Young he appears to have bigeminal PVC's but rhythm was regular when auscultated.  F/u in  afib clinic, 07/26/22. He was sent her by Dr. Gwenlyn Young as he Young him in afib  2/21. He is rate controled and minimally symptomatic with fatigue . He continues on tikosyn 125 mcg bid. EKG today shows rate controlled afib.  F/u in afib clinic, 08/07/22. He returned to afib in 3 days after CV. He feels weak in afib. We discussed today  tikosyn seems to have lost its effectiveness to control afib and to wash out Tikosyn and load amiodarone with a repeat CV in one month if he wants to restore SR  and he is in agreement,  risk vrs benefit of drug explained  and he wants to proceed. Most recent TSH/cmet/CXR in Epic reviewed.   F/u in afib clinic, 09/12/22. He is now s/p DCCV on 4/15 with successful conversion to SR x 1 shock. He is currently in SR today. Low HR noted today. He does feel tired, some head tightness. No missed doses of  anticoagulant.  Today, he denies symptoms of palpitations, shortness of breath, orthopnea, PND, lower extremity edema, dizziness, presyncope, syncope, or neurologic sequela.  The patient is tolerating medications without difficulties and is otherwise without complaint today.   Past Medical History:  Diagnosis Date   Adenomatous colon polyp    Allergic rhinitis    Anemia    Anxiety    Bladder cancer    currently on bcg treatments   BPH (benign prostatic hyperplasia)    CAD (coronary artery disease)    sees Dillon Young    Carotid artery disease    CHF (congestive heart failure)    Chronic kidney disease    Sones. Prostate cancer   Clotting disorder    CVA (cerebral infarction)    Diabetes mellitus    "Borderline"   Diverticulitis    Dysrhythmia    atrial fib - hx of    Edema    recent swelling in feet and ankles    GERD (gastroesophageal reflux disease)    history   Headache    Hemorrhoids    History of kidney stones    HTN (hypertension)    Hx of radiation therapy    for prostate - 2013    Hyperlipidemia LDL goal <70    IBS (irritable bowel syndrome)    Low back pain    Pre-diabetes    Prostate cancer 2008   seed implantation   Sleep apnea    sitting in recliner helps him to breathe per pt , never used cpap sleep study - pt reports could not go to sleep- 6 years ago    Smoker    Stroke    hx of mini strokes years ago    Past Surgical History:  Procedure Laterality Date   APPENDECTOMY     CARDIAC CATHETERIZATION N/A 01/16/2016   Procedure: Left Heart Cath and Coronary Angiography;  Surgeon: Dillon Crafts, MD;  Location: [facility] INVASIVE CV LAB;  Service: Cardiovascular;  Laterality: N/A;   CARDIAC CATHETERIZATION N/A 01/16/2016   Procedure: Coronary Stent Intervention;  Surgeon: Dillon Crafts, MD;  Location: [facility] INVASIVE CV LAB;  Service: Cardiovascular;  Laterality: N/A;   CARDIOVERSION N/A 11/26/2017   Procedure: CARDIOVERSION;  Surgeon: Dillon Nose, MD;  Location: [facility] ENDOSCOPY;  Service: Cardiovascular;  Laterality: N/A;   CARDIOVERSION N/A 03/13/2018   Procedure: CARDIOVERSION;  Surgeon: Dillon Nose, MD;  Location: [facility] ENDOSCOPY;  Service: Cardiovascular;  Laterality: N/A;   CARDIOVERSION N/A 07/31/2022   Procedure: CARDIOVERSION;  Surgeon: Lewayne Bunting, MD;  Location: [facility] ENDOSCOPY;  Service: Cardiovascular;  Laterality: N/A;   CARDIOVERSION N/A 09/10/2022   Procedure: CARDIOVERSION;  Surgeon: Pricilla Riffle, MD;  Location: [facility] INVASIVE CV LAB;  Service: Cardiovascular;  Laterality: N/A;  CATARACT EXTRACTION W/ INTRAOCULAR LENS  IMPLANT, BILATERAL Bilateral    COLONOSCOPY  10/21/2008   diverticulosis, radiation proctitis, internal hemorrhoids   CORONARY ANGIOPLASTY     CORONARY STENT INTERVENTION N/A 12/26/2017   Procedure: CORONARY STENT INTERVENTION;  Surgeon: Runell Gess, MD;  Location: [facility] INVASIVE CV LAB;  Service: Cardiovascular;  Laterality: N/A;   ENDARTERECTOMY  11/12/2011   Procedure: ENDARTERECTOMY CAROTID;  Surgeon: Larina Earthly, MD;  Location: [facility] OR;  Service: Vascular;  Laterality: Right;   ESOPHAGOGASTRODUODENOSCOPY (EGD) WITH ESOPHAGEAL DILATION     HEMORRHOID BANDING     LEFT HEART CATH AND CORONARY ANGIOGRAPHY N/A 12/26/2017   Procedure: LEFT HEART CATH AND CORONARY ANGIOGRAPHY;  Surgeon: Runell Gess, MD;  Location: [facility] INVASIVE CV LAB;  Service: Cardiovascular;  Laterality: N/A;   LITHOTRIPSY  X 3   LUMBAR LAMINECTOMY     TRANSURETHRAL RESECTION OF BLADDER TUMOR WITH MITOMYCIN-C Bilateral 01/06/2021   Procedure: TRANSURETHRAL RESECTION OF BLADDER TUMOR WITH GEMCITABINE BILATERAL RETROGRADES;  Surgeon: Bjorn Pippin, MD;  Location: [facility] ORS;  Service: Urology;  Laterality: Bilateral;   TRANSURETHRAL RESECTION OF PROSTATE  10/2003   TRANSURETHRAL RESECTION OF PROSTATE N/A 05/31/2020   Procedure: CYSTOSCOPY TRANSURETHRAL RESECTION BIOPSY AND FULGURATION OF PROSTATE LESION  (TURP);  Surgeon: Bjorn Pippin, MD;   Location: [facility] ORS;  Service: Urology;  Laterality: N/A;    Current Outpatient Medications  Medication Sig Dispense Refill   acetaminophen (TYLENOL) 500 MG tablet Take 500-1,000 mg by mouth daily as needed for mild pain or headache.     amiodarone (PACERONE) 200 MG tablet Take 1 tablet (200 mg total) by mouth daily.     apixaban (ELIQUIS) 5 MG TABS tablet Take 1 tablet (5 mg total) by mouth 2 (two) times daily. 180 tablet 3   ascorbic acid (VITAMIN C) 500 MG tablet Take 500 mg by mouth 2 (two) times a week.     Cholecalciferol (VITAMIN D-3) 25 MCG (1000 UT) CAPS Take 1,000 Units by mouth daily.     dicyclomine (BENTYL) 10 MG capsule TAKE 1 CAPSULE (10 MG TOTAL) BY MOUTH EVERY 6 (SIX) HOURS AS NEEDED (ABDOMINAL PAIN). 30 capsule 1   fexofenadine (ALLEGRA) 180 MG tablet Take 180 mg by mouth daily as needed for allergies or rhinitis.      fluticasone (FLONASE) 50 MCG/ACT nasal spray Place 1 spray into both nostrils daily as needed for allergies.     gabapentin (NEURONTIN) 300 MG capsule Take 1 capsule (300 mg total) by mouth 3 (three) times daily. (Patient taking differently: Take 300 mg by mouth 2 (two) times daily.) 270 capsule 1   hydrALAZINE (APRESOLINE) 25 MG tablet Take 25 mg by mouth 3 (three) times daily as needed (As need when BP is 160).     hydrocortisone (ANUSOL-HC) 2.5 % rectal cream Place 1 application rectally 3 (three) times daily as needed for hemorrhoids. 30 g 1   Hypromellose (ARTIFICIAL TEARS OP) Place 1 drop into both eyes 4 (four) times daily as needed (dry eyes).     ipratropium (ATROVENT) 0.06 % nasal spray Place 2 sprays into both nostrils 4 (four) times daily as needed for rhinitis. 90 mL 1   levocetirizine (XYZAL) 5 MG tablet Take 1 tablet (5 mg total) by mouth as needed.     melatonin 5 MG TABS Take 5 mg by mouth at bedtime as needed (sleep).     nitroGLYCERIN (NITROSTAT) 0.4 MG SL tablet Place 1 tablet (0.4 mg total) under the tongue every  5 (five) minutes as needed for  chest pain. 25 tablet 3   rosuvastatin (CRESTOR) 10 MG tablet TAKE 1 TABLET BY MOUTH EVERY DAY (Patient taking differently: Take 10 mg by mouth at bedtime.) 90 tablet 1   sacubitril-valsartan (ENTRESTO) 97-103 MG Take 1 tablet by mouth 2 (two) times daily. 180 tablet 3   spironolactone (ALDACTONE) 25 MG tablet TAKE 2 TABLETS BY MOUTH EVERY DAY 180 tablet 3   Tiotropium Bromide Monohydrate (SPIRIVA RESPIMAT) 2.5 MCG/ACT AERS INHALE 2 PUFFS BY MOUTH INTO THE LUNGS DAILY 4 g 0   triamcinolone cream (KENALOG) 0.1 % Apply 1 application topically daily as needed (to affected areas, for itching).      Wheat Dextrin (BENEFIBER DRINK MIX PO) Take 1 Dose by mouth daily in the afternoon. 1 dose = 2 tablespoons     No current facility-administered medications for this encounter.    Allergies  Allergen Reactions   Lipitor [Atorvastatin] Other (See Comments)    Muscle pain   Other Rash and Other (See Comments)    Detergent- Rashes on the skin    Social History   Socioeconomic History   Marital status: Married    Spouse name: Not on file   Number of children: 2   Years of education: Not on file   Highest education level: Not on file  Occupational History   Occupation: [employer]  Tobacco Use   Smoking status: Former    Packs/day: 1.50    Years: 60.00    Additional pack years: 0.00    Total pack years: 90.00    Types: Cigarettes    Quit date: 07/25/2008    Years since quitting: 14.1   Smokeless tobacco: Never  Vaping Use   Vaping Use: Never used  Substance and Sexual Activity   Alcohol use: Not Currently    Comment: occassionally   Drug use: No   Sexual activity: Not Currently  Other Topics Concern   Not on file  Social History Narrative   Married second time   Retired [employer] n41 yrs   2 daughters and 2 stepdaughters   Social Determinants of Health   Financial Resource Strain: Low Risk  (09/25/2021)   Overall Financial Resource Strain (CARDIA)     Difficulty of Paying Living Expenses: Not hard at all  Food Insecurity: No Food Insecurity (09/25/2021)   Hunger Vital Sign    Worried About Running Out of Food in the Last Year: Never true    Ran Out of Food in the Last Year: Never true  Transportation Needs: No Transportation Needs (09/25/2021)   PRAPARE - Administrator, Civil Service (Medical): No    Young of Transportation (Non-Medical): No  Physical Activity: Sufficiently Active (09/25/2021)   Exercise Vital Sign    Days of Exercise per Week: 5 days    Minutes of Exercise per Session: 30 min  Stress: No Stress Concern Present (09/25/2021)   [employer] of Occupational Health - Occupational Stress Questionnaire    Feeling of Stress : Not at all  Social Connections: Socially Integrated (09/25/2021)   Social Connection and Isolation Panel [NHANES]    Frequency of Communication with Friends and Family: More than three times a week    Frequency of Social Gatherings with Friends and Family: More than three times a week    Attends Religious Services: More than 4 times per year    Active Member of [employer] or Organizations: Yes    Attends [employer]  or Organization Meetings: More than 4 times per year    Marital Status: Married  Catering manager Violence: Not At Risk (09/25/2021)   Humiliation, Afraid, Rape, and Kick questionnaire    Fear of Current or Ex-Partner: No    Emotionally Abused: No    Physically Abused: No    Sexually Abused: No    Family History  Problem Relation Age of Onset   Heart disease Father    Hypertension Mother    Asthma Paternal Grandmother    Colon cancer Neg Hx    Esophageal cancer Neg Hx    Stomach cancer Neg Hx    Rectal cancer Neg Hx    Liver cancer Neg Hx     ROS- All systems are reviewed and negative except as per the HPI above  Physical Exam: Vitals:   09/12/22 1518  BP: (!) 172/62  Pulse: (!) 44  Weight: 83 kg  Height: 5\' 9"  (1.753 m)    Wt Readings from Last 3 Encounters:  09/12/22 83  kg  08/07/22 80.7 kg  07/26/22 80.5 kg    Labs: Lab Results  Component Value Date   NA 139 09/03/2022   K 4.3 09/03/2022   CL 102 09/03/2022   CO2 24 09/03/2022   GLUCOSE 170 (H) 09/03/2022   BUN 23 09/03/2022   CREATININE 1.40 (H) 09/03/2022   CALCIUM 9.2 09/03/2022   MG 2.1 07/26/2022   Lab Results  Component Value Date   INR 1.2 02/10/2022   Lab Results  Component Value Date   CHOL 129 10/18/2021   HDL 45 10/18/2021   LDLCALC 60 10/18/2021   TRIG 118 10/18/2021    GEN- The patient is well appearing, alert and oriented x 3 today.   Head- normocephalic, atraumatic Eyes-  Sclera clear, conjunctiva pink Ears- hearing intact Oropharynx- clear Neck- supple, no JVP Lymph- no cervical lymphadenopathy Lungs- Clear to ausculation bilaterally, normal work of breathing Heart- Regular bradycardic rate and rhythm, no murmurs, rubs or gallops, PMI not laterally displaced GI- soft, NT, ND, + BS Extremities- no clubbing, cyanosis, or edema MS- no significant deformity or atrophy Skin- no rash or lesion Psych- euthymic mood, full affect Neuro- strength and sensation are intact   EKG- Vent. rate 44 BPM PR interval 256 ms QRS duration 100 ms QT/QTcB 482/412 ms P-R-T axes 71 -65 42 Marked sinus bradycardia with 1st degree A-V block with frequent Premature ventricular complexes Left axis deviation Abnormal ECG When compared with ECG of 10-Sep-2022 09:30, PREVIOUS ECG IS PRESENT   Echo 08/22/22:  1. Left ventricular ejection fraction, by estimation, is 40 to 45%. The  left ventricle has mildly decreased function. The left ventricle  demonstrates global hypokinesis. There is mild concentric left ventricular  hypertrophy. Left ventricular diastolic  function could not be evaluated.   2. Right ventricular systolic function is normal. The right ventricular  size is normal. There is normal pulmonary artery systolic pressure. The  estimated right ventricular systolic pressure  is 31.8 mmHg.   3. Left atrial size was mild to moderately dilated.   4. The mitral valve is normal in structure. Trivial mitral valve  regurgitation. No evidence of mitral stenosis.   5. The aortic valve is grossly normal. There is mild calcification of the  aortic valve. There is mild thickening of the aortic valve. Aortic valve  regurgitation is not visualized. Aortic valve sclerosis is present, with  no evidence of aortic valve  stenosis.   6. The inferior vena cava is  normal in size with <50% respiratory  variability, suggesting right atrial pressure of 8 mmHg.   Comparison(s): Prior images reviewed side by side. Changes from prior  study are noted. EF mildly reduced compared to prior. Patient also in  atrial fibrillation on current study.   Assessment and Plan: 1. Atrial fibrillation - diagnosed May 2019 Has been maintained in SR on Tikosyn 125 mcg bid for many years and most recent CV did not hold SR for more than 3 days Young to be in rate controlled afib, symptomatic with fatigue  Cardioversion was successful but ERAF after 3 days Discussed washing out Tikosyn and starting amiodarone 200 mg bid x one month and then planning on CV again if needed  Risk vrs benefit of drug discussed in detail with wife and pt and he wants to proceed  Continue carvedilol 3.125 gm bid  He was started on K+ when on Tikosyn last week prior to CV, but will now stop it as K+ is ok for amiodarone at previous 3.7.  He is now s/p DCCV on 09/10/22.  He is currently in SR today. His HR has remained low. There may be some discrepancy with certain medications as to whether he is taking daily or BID.  Given his low HR and significantly elevated BP, okay to take hydralazine as needed later today.  Discontinue coreg.  Take amiodarone once daily.   F/u 2 weeks to recheck HR.    2. CHA2DS2VASc  score of at least 5 Continue 5 mg bid  No missed doses    3. CAD Stable  F/u 2 weeks to recheck HR off  coreg and taking amiodarone once daily.   [place] [name], PA-C Afib Clinic [facility] [address] [place], [place] [address] [phone]

## 2022-09-19 ENCOUNTER — Ambulatory Visit (HOSPITAL_COMMUNITY): Payer: PPO | Admitting: Physician Assistant

## 2022-09-21 ENCOUNTER — Ambulatory Visit (INDEPENDENT_AMBULATORY_CARE_PROVIDER_SITE_OTHER): Payer: PPO | Admitting: Family Medicine

## 2022-09-21 ENCOUNTER — Encounter: Payer: Self-pay | Admitting: Family Medicine

## 2022-09-21 VITALS — BP 150/66 | HR 53 | Temp 97.7°F | Ht 69.0 in | Wt 180.0 lb

## 2022-09-21 DIAGNOSIS — R0981 Nasal congestion: Secondary | ICD-10-CM | POA: Diagnosis not present

## 2022-09-21 DIAGNOSIS — H9313 Tinnitus, bilateral: Secondary | ICD-10-CM

## 2022-09-21 DIAGNOSIS — J029 Acute pharyngitis, unspecified: Secondary | ICD-10-CM | POA: Diagnosis not present

## 2022-09-21 DIAGNOSIS — J019 Acute sinusitis, unspecified: Secondary | ICD-10-CM | POA: Diagnosis not present

## 2022-09-21 DIAGNOSIS — Z974 Presence of external hearing-aid: Secondary | ICD-10-CM | POA: Diagnosis not present

## 2022-09-21 DIAGNOSIS — R0982 Postnasal drip: Secondary | ICD-10-CM

## 2022-09-21 DIAGNOSIS — H6121 Impacted cerumen, right ear: Secondary | ICD-10-CM | POA: Diagnosis not present

## 2022-09-21 DIAGNOSIS — J3489 Other specified disorders of nose and nasal sinuses: Secondary | ICD-10-CM | POA: Diagnosis not present

## 2022-09-21 DIAGNOSIS — R051 Acute cough: Secondary | ICD-10-CM

## 2022-09-21 MED ORDER — DOXYCYCLINE HYCLATE 100 MG PO TABS
100.0000 mg | ORAL_TABLET | Freq: Two times a day (BID) | ORAL | 0 refills | Status: DC
Start: 2022-09-21 — End: 2022-10-19

## 2022-09-21 NOTE — Progress Notes (Unsigned)
Subjective:  Dillon Young is a 89 y.o. male who presents for right ear discomfort. Feels like he has fluid in his ear. Tinnitus-bilateral. Has not been wearing his hearing aid due to making this worse.  C/o a 2 wk hx of ST. Severe congestion, post nasal drainage and allergies.   Doing salt water gargles.   Denies fever, chills, headaches, dizziness, chest pain, palpitations, shortness of breath,   No other aggravating or relieving factors.  No other c/o.  ROS as in subjective.   Objective: Vitals:   09/21/22 1259  BP: (!) 150/66  Pulse: (!) 53  Temp: 97.7 F (36.5 C)  SpO2: 98%    General appearance: Alert, WD/WN, no distress                             Skin: warm, no rash                           Head: + max sinus tenderness                            Eyes: conjunctiva normal, corneas clear, PERRLA                            Ears: right ear canal with cerumen impaction, left ear canal and TM normal appearing                          Nose: septum midline, no drainage              Mouth/throat: MMM, tongue normal, mild pharyngeal erythema                           Neck: supple, no adenopathy, no thyromegaly, nontender                          Heart: bradycardia, regular rhythm                          Lungs: CTA bilaterally, no wheezes, rales, or rhonchi      Assessment: Right ear impacted cerumen - Plan: Ambulatory referral to ENT  Nasal congestion with rhinorrhea  Post-nasal drainage  Acute pharyngitis, unspecified etiology  Tinnitus of both ears - Plan: Ambulatory referral to ENT  Uses hearing aid - Plan: Ambulatory referral to ENT  Acute cough  Acute sinusitis with symptoms > 10 days - Plan: doxycycline (VIBRA-TABS) 100 MG tablet   Plan: Ear lavage done on right ear for cerumen impaction per patient approval. Post lavage, right TM not clear, wax still present. Referral to ENT for this and for worsening tinnitus not improved with hearing aids. Doxycycline  prescribed for suspected sinusitis and cough.   Suggested symptomatic OTC remedies. Nasal saline spray for congestion.   Follow up if worsening.

## 2022-09-21 NOTE — Patient Instructions (Signed)
Continue your current allergy medications including Flonase.   Take the antibiotic as prescribed with food.   I have referred you to an ENT specialist and you should hear from them in the next week or so. Let us know if you do not.

## 2022-09-26 ENCOUNTER — Ambulatory Visit (HOSPITAL_COMMUNITY)
Admission: RE | Admit: 2022-09-26 | Discharge: 2022-09-26 | Disposition: A | Discharge status: Home / Self Care | Payer: PPO | Source: Ambulatory Visit | Attending: Cardiovascular Disease | Admitting: Cardiovascular Disease

## 2022-09-26 DIAGNOSIS — I6523 Occlusion and stenosis of bilateral carotid arteries: Secondary | ICD-10-CM | POA: Diagnosis not present

## 2022-09-27 ENCOUNTER — Ambulatory Visit (HOSPITAL_COMMUNITY)
Admission: RE | Admit: 2022-09-27 | Discharge: 2022-09-27 | Disposition: A | Discharge status: Home / Self Care | Payer: PPO | Source: Ambulatory Visit | Attending: Internal Medicine | Admitting: Internal Medicine

## 2022-09-27 VITALS — BP 144/56 | HR 72 | Ht 69.0 in | Wt 179.6 lb

## 2022-09-27 DIAGNOSIS — I10 Essential (primary) hypertension: Secondary | ICD-10-CM | POA: Diagnosis not present

## 2022-09-27 DIAGNOSIS — I2510 Atherosclerotic heart disease of native coronary artery without angina pectoris: Secondary | ICD-10-CM | POA: Diagnosis not present

## 2022-09-27 DIAGNOSIS — E785 Hyperlipidemia, unspecified: Secondary | ICD-10-CM | POA: Insufficient documentation

## 2022-09-27 DIAGNOSIS — Z7901 Long term (current) use of anticoagulants: Secondary | ICD-10-CM | POA: Insufficient documentation

## 2022-09-27 DIAGNOSIS — R008 Other abnormalities of heart beat: Secondary | ICD-10-CM | POA: Insufficient documentation

## 2022-09-27 DIAGNOSIS — I4819 Other persistent atrial fibrillation: Secondary | ICD-10-CM | POA: Diagnosis not present

## 2022-09-27 DIAGNOSIS — Z79899 Other long term (current) drug therapy: Secondary | ICD-10-CM | POA: Insufficient documentation

## 2022-09-27 DIAGNOSIS — Z87891 Personal history of nicotine dependence: Secondary | ICD-10-CM | POA: Diagnosis not present

## 2022-09-27 DIAGNOSIS — I440 Atrioventricular block, first degree: Secondary | ICD-10-CM | POA: Diagnosis not present

## 2022-09-27 DIAGNOSIS — Z955 Presence of coronary angioplasty implant and graft: Secondary | ICD-10-CM | POA: Insufficient documentation

## 2022-09-27 DIAGNOSIS — I493 Ventricular premature depolarization: Secondary | ICD-10-CM | POA: Diagnosis not present

## 2022-09-27 DIAGNOSIS — I4821 Permanent atrial fibrillation: Secondary | ICD-10-CM

## 2022-09-27 MED ORDER — CARVEDILOL 3.125 MG PO TABS: 3.1250 mg | ORAL_TABLET | Freq: Two times a day (BID) | ORAL | 6 refills | Status: DC

## 2022-09-27 MED ORDER — CARVEDILOL 3.125 MG PO TABS: 3.1250 mg | ORAL_TABLET | Freq: Two times a day (BID) | ORAL | Status: DC

## 2022-09-27 NOTE — Progress Notes (Signed)
Primary Care Physician: [name], MD Referring Physician:Dr. [name] AHF:Dr. Nathanael Lotz is a 89 y.o. male with a h/o coronary artery disease status post angioplasty in 1994 and 1995 of his circumflex coronary artery. . He has a history of hypertension and hyperlipidemia on medication. He had right carotid endarterectomy performed by Dr. Curt Jews June 2013.Myoview stress test performed 01/09/16 was notable for ischemia in the RCA distribution. Based on this he underwent cardiac catheterization by Dr. [name] 01/16/16 revealing a 90% segmental mid dominant RCA stenosis which was stented with a Synergy 3.5 mm x 20 mm long drug-eluting stent. He did have a very small ramus branch that was 90% stenosed and not suitable for intervention as well as a 70% mid LAD lesion although he had no evidence of anterior ischemia on his stress test. His symptoms of chest pain have resolved.   He was diagnosed in May 2019 with new  onset  afib from which the pt is asymptomatic. Echo was done and showed EF decreased to 30-35%, normal EF by echo in August 2017. He has been on Eliquis 5 mg bid since early May without missed doses.  Plavix were stopped at that time, ASA continued and pt was started on carvedilol 6.25 mg bid.He states that he had a sleep study in the past but never slept enough with the study to get good results. He is afraid that he would never be able to tolerate a cpap. Sleeps in a recliner now, " I just feel more comfortable".  F/u in afib clinic 10/03/17, unfortunately cardioversion only held for 1-2 days.He developed  exertional chest discomfort   and was seen by R. Barrett, PA and had a cardiac cath which pt did receive a stent in the LAD. He feels improved but still remains in afib.  He did have to hold anticoagulation for 1-2 days and was back on anticoagulation without interruption on 8/23.  We did discuss trying to restore SR with EF moderately reduced and he would like to purse  after his vacation at the end of the month.  F/u in afib clinic, 01/31/18. He is back in afib clinic to discuss Tikosyn admit.  He has recently seen Dr. [name] and he also encouraged pt to consider Tiksoyn for his LV dysfunction.Marland Kitchen He is feeling better and was hoping he could get by without drug. He will commit but wants to wait until 10/1. He also reports that he has been having h/a's ever since he started Imdur. I discussed with Doroteo Bradford, PharmD, and  Imdur was eventually stopped with resolution of H/A.  [name] is now back in afib clinic for tikosyn admit.  No misses eliquis doses and no benadryl use.PharmD has screened drugs and no qt prolonging drugs. He will be applying for drug assistance and papers received and reviewed.  F/u 10/28, s/p Tikosyn admit. He is in SR with 125 mcg of Tikosyn. He will be getting drug thru pharmacy  assistance program. He feels improved.  F/u Tikosyn 11/21, he continues in [name]. He has a sinus infection and is taking cortisone and antibiotic currently. qtc 463 ms. By Marthann Schiller he appears to have bigeminal PVC's but rhythm was regular when auscultated.  F/u in  afib clinic, 07/26/22. He was sent her by Dr. [name] as he found him in afib  2/21. He is rate controled and minimally symptomatic with fatigue . He continues on tikosyn 125 mcg bid. EKG today shows rate controlled afib.  F/u in afib clinic, 08/07/22. He returned to afib in 3 days after CV. He feels weak in afib. We discussed today  tikosyn seems to have lost its effectiveness to control afib and to wash out Tikosyn and load amiodarone with a repeat CV in one month if he wants to restore SR  and he is in agreement,  risk vrs benefit of drug explained  and he wants to proceed. Most recent TSH/cmet/CXR in Epic reviewed.   F/u in afib clinic, 09/12/22. He is now s/p DCCV on 4/15 with successful conversion to SR x 1 shock. He is currently in SR today. Low HR noted today. He does feel tired, some head tightness. No missed doses of  anticoagulant.  F/u in afib clinic, 09/27/22. He is currently out of rhythm today since transition from amio 200 mg BID to once daily. He feels well overall and HR improved. Occasionally feels fluttering at night but this is seldom. No missed doses of anticoagulant.  Today, he denies symptoms of palpitations, shortness of breath, orthopnea, PND, lower extremity edema, dizziness, presyncope, syncope, or neurologic sequela.  The patient is tolerating medications without difficulties and is otherwise without complaint today.   Past Medical History:  Diagnosis Date   Adenomatous colon polyp    Allergic rhinitis    Anemia    Anxiety    Bladder cancer (HCC)    currently on bcg treatments   BPH (benign prostatic hyperplasia)    CAD (coronary artery disease)    sees Dr. Nanetta Batty    Carotid artery disease [facility])    CHF (congestive heart failure) (HCC)    Chronic kidney disease    Sones. Prostate cancer   Clotting disorder (HCC)    CVA (cerebral infarction)    Diabetes mellitus    "Borderline"   Diverticulitis    Dysrhythmia    atrial fib - hx of    Edema    recent swelling in feet and ankles    GERD (gastroesophageal reflux disease)    history   Headache    Hemorrhoids    History of kidney stones    HTN (hypertension)    Hx of radiation therapy    for prostate - 2013    Hyperlipidemia LDL goal <70    IBS (irritable bowel syndrome)    Low back pain    Pre-diabetes    Prostate cancer (HCC) 2008   seed implantation   Sleep apnea    sitting in recliner helps him to breathe per pt , never used cpap sleep study - pt reports could not go to sleep- 6 years ago    Smoker    Stroke [name])    hx of mini strokes years ago    Past Surgical History:  Procedure Laterality Date   APPENDECTOMY     CARDIAC CATHETERIZATION N/A 01/16/2016   Procedure: Left Heart Cath and Coronary Angiography;  Surgeon: Corky Crafts, MD;  [facility] INVASIVE CV LAB;  Service: Cardiovascular;   Laterality: N/A;   CARDIAC CATHETERIZATION N/A 01/16/2016   Procedure: Coronary Stent Intervention;  Surgeon: Corky Crafts, MD;  Location: [facility] INVASIVE CV LAB;  Service: Cardiovascular;  Laterality: N/A;   CARDIOVERSION N/A 11/26/2017   Procedure: CARDIOVERSION;  Surgeon: Chrystie Nose, MD;  Location: [facility] ENDOSCOPY;  Service: Cardiovascular;  Laterality: N/A;   CARDIOVERSION N/A 03/13/2018   Procedure: CARDIOVERSION;  Surgeon: Chrystie Nose, MD;  Location: [facility] ENDOSCOPY;  Service: Cardiovascular;  Laterality: N/A;   CARDIOVERSION N/A  07/31/2022   Procedure: CARDIOVERSION;  Surgeon: Lewayne Bunting, MD;  Location: [facility] ENDOSCOPY;  Service: Cardiovascular;  Laterality: N/A;   CARDIOVERSION N/A 09/10/2022   Procedure: CARDIOVERSION;  Surgeon: Pricilla Riffle, MD;  Location: [facility] INVASIVE CV LAB;  Service: Cardiovascular;  Laterality: N/A;   CATARACT EXTRACTION W/ INTRAOCULAR LENS  IMPLANT, BILATERAL Bilateral    COLONOSCOPY  10/21/2008   diverticulosis, radiation proctitis, internal hemorrhoids   CORONARY ANGIOPLASTY     CORONARY STENT INTERVENTION N/A 12/26/2017   Procedure: CORONARY STENT INTERVENTION;  Surgeon: Runell Gess, MD;  Location: [facility] INVASIVE CV LAB;  Service: Cardiovascular;  Laterality: N/A;   ENDARTERECTOMY  11/12/2011   Procedure: ENDARTERECTOMY CAROTID;  Surgeon: Larina Earthly, MD;  Location: [facility] OR;  Service: Vascular;  Laterality: Right;   ESOPHAGOGASTRODUODENOSCOPY (EGD) WITH ESOPHAGEAL DILATION     HEMORRHOID BANDING     LEFT HEART CATH AND CORONARY ANGIOGRAPHY N/A 12/26/2017   Procedure: LEFT HEART CATH AND CORONARY ANGIOGRAPHY;  Surgeon: Runell Gess, MD;  Location: [facility] INVASIVE CV LAB;  Service: Cardiovascular;  Laterality: N/A;   LITHOTRIPSY  X 3   LUMBAR LAMINECTOMY     TRANSURETHRAL RESECTION OF BLADDER TUMOR WITH MITOMYCIN-C Bilateral 01/06/2021   Procedure: TRANSURETHRAL RESECTION OF BLADDER TUMOR WITH GEMCITABINE BILATERAL RETROGRADES;  Surgeon: Bjorn Pippin, MD;   Location: [facility] ORS;  Service: Urology;  Laterality: Bilateral;   TRANSURETHRAL RESECTION OF PROSTATE  10/2003   TRANSURETHRAL RESECTION OF PROSTATE N/A 05/31/2020   Procedure: CYSTOSCOPY TRANSURETHRAL RESECTION BIOPSY AND FULGURATION OF PROSTATE LESION  (TURP);  Surgeon: Bjorn Pippin, MD;  Location: [facility] ORS;  Service: Urology;  Laterality: N/A;    Current Outpatient Medications  Medication Sig Dispense Refill   acetaminophen (TYLENOL) 500 MG tablet Take 500-1,000 mg by mouth daily as needed for mild pain or headache.     apixaban (ELIQUIS) 5 MG TABS tablet Take 1 tablet (5 mg total) by mouth 2 (two) times daily. 180 tablet 3   ascorbic acid (VITAMIN C) 500 MG tablet Take 500 mg by mouth 2 (two) times a week.     carvedilol (COREG) 3.125 MG tablet Take 1 tablet (3.125 mg total) by mouth 2 (two) times daily with a meal.     Cholecalciferol (VITAMIN D-3) 25 MCG (1000 UT) CAPS Take 1,000 Units by mouth daily.     dicyclomine (BENTYL) 10 MG capsule TAKE 1 CAPSULE (10 MG TOTAL) BY MOUTH EVERY 6 (SIX) HOURS AS NEEDED (ABDOMINAL PAIN). 30 capsule 1   doxycycline (VIBRA-TABS) 100 MG tablet Take 1 tablet (100 mg total) by mouth 2 (two) times daily. 20 tablet 0   fexofenadine (ALLEGRA) 180 MG tablet Take 180 mg by mouth daily as needed for allergies or rhinitis.      fluticasone (FLONASE) 50 MCG/ACT nasal spray Place 1 spray into both nostrils daily as needed for allergies.     gabapentin (NEURONTIN) 300 MG capsule Take 1 capsule (300 mg total) by mouth 3 (three) times daily. (Patient taking differently: Take 300 mg by mouth 2 (two) times daily.) 270 capsule 1   hydrALAZINE (APRESOLINE) 25 MG tablet Take 25 mg by mouth 3 (three) times daily as needed (As need when BP is 160).     hydrocortisone (ANUSOL-HC) 2.5 % rectal cream Place 1 application rectally 3 (three) times daily as needed for hemorrhoids. 30 g 1   Hypromellose (ARTIFICIAL TEARS OP) Place 1 drop into both eyes 4 (four) times daily as needed (dry eyes).  ipratropium (ATROVENT) 0.06 % nasal spray Place 2 sprays into both nostrils 4 (four) times daily as needed for rhinitis. 90 mL 1   levocetirizine (XYZAL) 5 MG tablet Take 1 tablet (5 mg total) by mouth as needed.     melatonin 5 MG TABS Take 5 mg by mouth at bedtime as needed (sleep).     nitroGLYCERIN (NITROSTAT) 0.4 MG SL tablet Place 1 tablet (0.4 mg total) under the tongue every 5 (five) minutes as needed for chest pain. 25 tablet 3   rosuvastatin (CRESTOR) 10 MG tablet TAKE 1 TABLET BY MOUTH EVERY DAY (Patient taking differently: Take 10 mg by mouth at bedtime.) 90 tablet 1   sacubitril-valsartan (ENTRESTO) 97-103 MG Take 1 tablet by mouth 2 (two) times daily. 180 tablet 3   Tiotropium Bromide Monohydrate (SPIRIVA RESPIMAT) 2.5 MCG/ACT AERS INHALE 2 PUFFS BY MOUTH INTO THE LUNGS DAILY 4 g 0   triamcinolone cream (KENALOG) 0.1 % Apply 1 application topically daily as needed (to affected areas, for itching).      Wheat Dextrin (BENEFIBER DRINK MIX PO) Take 1 Dose by mouth every morning. 1 dose = 2 tablespoons     spironolactone (ALDACTONE) 25 MG tablet TAKE 2 TABLETS BY MOUTH EVERY DAY (Patient not taking: Reported on 09/27/2022) 180 tablet 3   No current facility-administered medications for this encounter.    Allergies  Allergen Reactions   Lipitor [Atorvastatin] Other (See Comments)    Muscle pain   Other Rash and Other (See Comments)    Detergent- Rashes on the skin    Social History   Socioeconomic History   Marital status: Married    Spouse name: Not on file   Number of children: 2   Years of education: Not on file   Highest education level: Not on file  Occupational History   Occupation: [employer]  Tobacco Use   Smoking status: Former    Packs/day: 1.50    Years: 60.00    Additional pack years: 0.00    Total pack years: 90.00    Types: Cigarettes    Quit date: 07/25/2008    Years since quitting: 14.1   Smokeless tobacco: Never  Vaping Use   Vaping  Use: Never used  Substance and Sexual Activity   Alcohol use: Not Currently    Comment: occassionally   Drug use: No   Sexual activity: Not Currently  Other Topics Concern   Not on file  Social History Narrative   Married second time   Retired [employer] n39 yrs   2 daughters and 2 stepdaughters   Social Determinants of Health   Financial Resource Strain: Low Risk  (09/25/2021)   Overall Financial Resource Strain (CARDIA)    Difficulty of Paying Living Expenses: Not hard at all  Food Insecurity: No Food Insecurity (09/25/2021)   Hunger Vital Sign    Worried About Running Out of Food in the Last Year: Never true    Ran Out of Food in the Last Year: Never true  Transportation Needs: No Transportation Needs (09/25/2021)   PRAPARE - Administrator, Civil Service (Medical): No    Lack of Transportation (Non-Medical): No  Physical Activity: Sufficiently Active (09/25/2021)   Exercise Vital Sign    Days of Exercise per Week: 5 days    Minutes of Exercise per Session: 30 min  Stress: No Stress Concern Present (09/25/2021)   [employer] of Occupational Health - Occupational Stress Questionnaire  Feeling of Stress : Not at all  Social Connections: Socially Integrated (09/25/2021)   Social Connection and Isolation Panel [NHANES]    Frequency of Communication with Friends and Family: More than three times a week    Frequency of Social Gatherings with Friends and Family: More than three times a week    Attends Religious Services: More than 4 times per year    Active Member of Clubs or Organizations: Yes    Attends Banker Meetings: More than 4 times per year    Marital Status: Married  Catering manager Violence: Not At Risk (09/25/2021)   Humiliation, Afraid, Rape, and Kick questionnaire    Fear of Current or Ex-Partner: No    Emotionally Abused: No    Physically Abused: No    Sexually Abused: No    Family History  Problem Relation Age of Onset    Heart disease Father    Hypertension Mother    Asthma Paternal Grandmother    Colon cancer Neg Hx    Esophageal cancer Neg Hx    Stomach cancer Neg Hx    Rectal cancer Neg Hx    Liver cancer Neg Hx     ROS- All systems are reviewed and negative except as per the HPI above  Physical Exam: Vitals:   09/27/22 1356  BP: (!) 144/56  Pulse: 72  Weight: 81.5 kg  Height: 5\' 9"  (1.753 m)    Wt Readings from Last 3 Encounters:  09/27/22 81.5 kg  09/21/22 81.6 kg  09/12/22 83 kg    Labs: Lab Results  Component Value Date   NA 139 09/03/2022   K 4.3 09/03/2022   CL 102 09/03/2022   CO2 24 09/03/2022   GLUCOSE 170 (H) 09/03/2022   BUN 23 09/03/2022   CREATININE 1.40 (H) 09/03/2022   CALCIUM 9.2 09/03/2022   MG 2.1 07/26/2022   Lab Results  Component Value Date   INR 1.2 02/10/2022   Lab Results  Component Value Date   CHOL 129 10/18/2021   HDL 45 10/18/2021   LDLCALC 60 10/18/2021   TRIG 118 10/18/2021    GEN- The patient is well appearing, alert and oriented x 3 today.   Head- normocephalic, atraumatic Eyes-  Sclera clear, conjunctiva pink Ears- hearing intact Oropharynx- clear Neck- supple, no JVP Lymph- no cervical lymphadenopathy Lungs- Clear to ausculation bilaterally, normal work of breathing Heart- Regular rate and rhythm with ectopy noted, no murmurs, rubs or gallops, PMI not laterally displaced GI- soft, NT, ND, + BS Extremities- no clubbing, cyanosis, or edema MS- no significant deformity or atrophy Skin- no rash or lesion Psych- euthymic mood, full affect Neuro- strength and sensation are intact   EKG- Vent. rate 72 BPM PR interval 312 ms QRS duration 102 ms QT/QTcB 420/459 ms P-R-T axes * -53 54 Sinus rhythm with 1st degree A-V block with frequent Premature ventricular complexes in a pattern of bigeminy Left axis deviation Low voltage QRS Abnormal ECG When compared with ECG of 12-Sep-2022 15:59, PREVIOUS ECG IS PRESENT   Echo 08/22/22:   1. Left ventricular ejection fraction, by estimation, is 40 to 45%. The  left ventricle has mildly decreased function. The left ventricle  demonstrates global hypokinesis. There is mild concentric left ventricular  hypertrophy. Left ventricular diastolic  function could not be evaluated.   2. Right ventricular systolic function is normal. The right ventricular  size is normal. There is normal pulmonary artery systolic pressure. The  estimated right ventricular systolic pressure  is 31.8 mmHg.   3. Left atrial size was mild to moderately dilated.   4. The mitral valve is normal in structure. Trivial mitral valve  regurgitation. No evidence of mitral stenosis.   5. The aortic valve is grossly normal. There is mild calcification of the  aortic valve. There is mild thickening of the aortic valve. Aortic valve  regurgitation is not visualized. Aortic valve sclerosis is present, with  no evidence of aortic valve  stenosis.   6. The inferior vena cava is normal in size with <50% respiratory  variability, suggesting right atrial pressure of 8 mmHg.   Comparison(s): Prior images reviewed side by side. Changes from prior  study are noted. EF mildly reduced compared to prior. Patient also in  atrial fibrillation on current study.   Assessment and Plan: 1. Atrial fibrillation - diagnosed May 2019 Has been maintained in SR on Tikosyn 125 mcg bid for many years and most recent CV did not hold SR for more than 3 days Found to be in rate controlled afib, symptomatic with fatigue  Cardioversion was successful but ERAF after 3 days Discussed washing out Tikosyn and starting amiodarone 200 mg bid x one month and then planning on CV again if needed  Risk vrs benefit of drug discussed in detail with wife and pt and he wants to proceed  Continue carvedilol 3.125 gm bid  He was started on K+ when on Tikosyn last week prior to CV, but will now stop it as K+ is ok for amiodarone at previous 3.7.  He is now  s/p DCCV on 09/10/22.  He is currently out of rhythm after having transitioned from amiodarone 200 mg BID to amio 200 mg daily. I offered him the choice of reloading amiodarone and scheduling repeat cardioversion but he declines this. He states he feels well overall and would rather stop taking the amiodarone. In this case, I have noted to patient that going forward this means we will primarily manage his Afib with rate control only. He is okay with this. He has shown me BP recordings at home that look reassuring.  We will stop the amiodarone. We will restart the coreg 3.125 mg BID to continue rate control. Repeat ECG in several weeks to reassess.  2. CHA2DS2VASc  score of at least 5 Continue 5 mg bid  No missed doses    3. CAD Stable  Repeat ECG in 2-3 weeks.   Lake [name], PA-C Afib Clinic [facility] [address] [place], [place] [address] [phone]

## 2022-09-27 NOTE — Patient Instructions (Signed)
Stop amiodarone   Restart Coreg 3.125mg  twice a day

## 2022-10-02 DIAGNOSIS — H6123 Impacted cerumen, bilateral: Secondary | ICD-10-CM | POA: Diagnosis not present

## 2022-10-02 DIAGNOSIS — H9313 Tinnitus, bilateral: Secondary | ICD-10-CM | POA: Diagnosis not present

## 2022-10-02 DIAGNOSIS — Z974 Presence of external hearing-aid: Secondary | ICD-10-CM | POA: Diagnosis not present

## 2022-10-04 ENCOUNTER — Other Ambulatory Visit: Payer: Self-pay | Admitting: Internal Medicine

## 2022-10-04 DIAGNOSIS — E785 Hyperlipidemia, unspecified: Secondary | ICD-10-CM

## 2022-10-04 DIAGNOSIS — J310 Chronic rhinitis: Secondary | ICD-10-CM

## 2022-10-08 ENCOUNTER — Telehealth: Payer: Self-pay | Admitting: Internal Medicine

## 2022-10-08 NOTE — Telephone Encounter (Signed)
Contacted Dillon Young to schedule their annual wellness visit. Appointment made for 10/19/2022.  [facility] Care Guide [facility] AWV TEAM Direct Dial: [phone]

## 2022-10-10 ENCOUNTER — Ambulatory Visit (HOSPITAL_COMMUNITY)
Admission: RE | Admit: 2022-10-10 | Discharge: 2022-10-10 | Disposition: A | Discharge status: Home / Self Care | Payer: PPO | Source: Ambulatory Visit | Attending: Internal Medicine | Admitting: Internal Medicine

## 2022-10-10 VITALS — HR 63

## 2022-10-10 DIAGNOSIS — I4891 Unspecified atrial fibrillation: Secondary | ICD-10-CM | POA: Diagnosis not present

## 2022-10-10 DIAGNOSIS — I4819 Other persistent atrial fibrillation: Secondary | ICD-10-CM

## 2022-10-10 NOTE — Progress Notes (Signed)
Patient doing well with no concerns. He is walking four days a week with his wife without issue. He remains in Afib and confirm from last office visit he feels well and does not want cardioversion or anti arrhythmic therapy. He wishes to pursue rate control only for his Afib.   Vent. rate 63 BPM PR interval * ms QRS duration 104 ms QT/QTcB 406/415 ms P-R-T axes * -67 53 Atrial fibrillation with premature ventricular or aberrantly conducted complexes Left axis deviation Abnormal ECG When compared with ECG of 27-Sep-2022 14:11, PREVIOUS ECG IS PRESENT   Pt will follow up with me in 6 months. Continue coreg 3.125 mg BID.

## 2022-10-19 ENCOUNTER — Ambulatory Visit (INDEPENDENT_AMBULATORY_CARE_PROVIDER_SITE_OTHER): Payer: PPO

## 2022-10-19 DIAGNOSIS — Z0000 Encounter for general adult medical examination without abnormal findings: Secondary | ICD-10-CM | POA: Diagnosis not present

## 2022-10-19 NOTE — Patient Instructions (Signed)
Health Maintenance, Male Adopting a healthy lifestyle and getting preventive care are important in promoting health and wellness. Ask your health care provider about: The right schedule for you to have regular tests and exams. Things you can do on your own to prevent diseases and keep yourself healthy. What should I know about diet, weight, and exercise? Eat a healthy diet  Eat a diet that includes plenty of vegetables, fruits, low-fat dairy products, and lean protein. Do not eat a lot of foods that are high in solid fats, added sugars, or sodium. Maintain a healthy weight Body mass index (BMI) is a measurement that can be used to identify possible weight problems. It estimates body fat based on height and weight. Your health care provider can help determine your BMI and help you achieve or maintain a healthy weight. Get regular exercise Get regular exercise. This is one of the most important things you can do for your health. Most adults should: Exercise for at least 150 minutes each week. The exercise should increase your heart rate and make you sweat (moderate-intensity exercise). Do strengthening exercises at least twice a week. This is in addition to the moderate-intensity exercise. Spend less time sitting. Even light physical activity can be beneficial. Watch cholesterol and blood lipids Have your blood tested for lipids and cholesterol at 87 years of age, then have this test every 5 years. You may need to have your cholesterol levels checked more often if: Your lipid or cholesterol levels are high. You are older than 87 years of age. You are at high risk for heart disease. What should I know about cancer screening? Many types of cancers can be detected early and may often be prevented. Depending on your health history and family history, you may need to have cancer screening at various ages. This may include screening for: Colorectal cancer. Prostate cancer. Skin cancer. Lung  cancer. What should I know about heart disease, diabetes, and high blood pressure? Blood pressure and heart disease High blood pressure causes heart disease and increases the risk of stroke. This is more likely to develop in people who have high blood pressure readings or are overweight. Talk with your health care provider about your target blood pressure readings. Have your blood pressure checked: Every 3-5 years if you are 18-39 years of age. Every year if you are 40 years old or older. If you are between the ages of 65 and 75 and are a current or former smoker, ask your health care provider if you should have a one-time screening for abdominal aortic aneurysm (AAA). Diabetes Have regular diabetes screenings. This checks your fasting blood sugar level. Have the screening done: Once every three years after age 45 if you are at a normal weight and have a low risk for diabetes. More often and at a younger age if you are overweight or have a high risk for diabetes. What should I know about preventing infection? Hepatitis B If you have a higher risk for hepatitis B, you should be screened for this virus. Talk with your health care provider to find out if you are at risk for hepatitis B infection. Hepatitis C Blood testing is recommended for: Everyone born from 1945 through 1965. Anyone with known risk factors for hepatitis C. Sexually transmitted infections (STIs) You should be screened each year for STIs, including gonorrhea and chlamydia, if: You are sexually active and are younger than 87 years of age. You are older than 87 years of age and your   health care provider tells you that you are at risk for this type of infection. Your sexual activity has changed since you were last screened, and you are at increased risk for chlamydia or gonorrhea. Ask your health care provider if you are at risk. Ask your health care provider about whether you are at high risk for HIV. Your health care provider  may recommend a prescription medicine to help prevent HIV infection. If you choose to take medicine to prevent HIV, you should first get tested for HIV. You should then be tested every 3 months for as long as you are taking the medicine. Follow these instructions at home: Alcohol use Do not drink alcohol if your health care provider tells you not to drink. If you drink alcohol: Limit how much you have to 0-2 drinks a day. Know how much alcohol is in your drink. In the [place] one drink equals one 12 oz bottle of beer (355 mL), one 5 oz glass of wine (148 mL), or one 1 oz glass of hard liquor (44 mL). Lifestyle Do not use any products that contain nicotine or tobacco. These products include cigarettes, chewing tobacco, and vaping devices, such as e-cigarettes. If you need help quitting, ask your health care provider. Do not use street drugs. Do not share needles. Ask your health care provider for help if you need support or information about quitting drugs. General instructions Schedule regular health, dental, and eye exams. Stay current with your vaccines. Tell your health care provider if: You often feel depressed. You have ever been abused or do not feel safe at home. Summary Adopting a healthy lifestyle and getting preventive care are important in promoting health and wellness. Follow your health care provider's instructions about healthy diet, exercising, and getting tested or screened for diseases. Follow your health care provider's instructions on monitoring your cholesterol and blood pressure. This information is not intended to replace advice given to you by your health care provider. Make sure you discuss any questions you have with your health care provider. Document Revised: 10/03/2020 Document Reviewed: 10/03/2020 [employer] Patient Education  2024 [employer]  

## 2022-10-19 NOTE — Progress Notes (Signed)
I connected with  Juluis Rainier on 10/19/22 by a audio enabled telemedicine application and verified that I am speaking with the correct person using two identifiers.  Patient Location: Home  Provider Location: Home Office  I discussed the limitations of evaluation and management by telemedicine. The patient expressed understanding and agreed to proceed.  Subjective:   Dillon Young is a 89 y.o. male who presents for Medicare Annual/Subsequent preventive examination.  Review of Systems    Per HPI unless specifically indicated below.        Objective:       10/10/2022    1:24 PM 09/27/2022    1:56 PM 09/21/2022   12:59 PM  Vitals with BMI  Height  5\' 9"  5\' 9"   Weight  179 lbs 10 oz 180 lbs  BMI  26.51 26.57  Systolic  144 150  Diastolic  56 66  Pulse 63 72 53    There were no vitals filed for this visit. There is no height or weight on file to calculate BMI.     09/10/2022    8:28 AM 07/31/2022    9:41 AM 02/10/2022    8:26 AM 09/25/2021   11:40 AM 12/28/2020    1:09 PM 09/15/2020    4:26 PM 05/31/2020    8:54 AM  Advanced Directives  Does Patient Have a Medical Advance Directive? No No No Yes Yes Yes Yes  Type of Theme park manager;Living will Healthcare Power of [place];Living will Living will;Healthcare Power of [employer] Power of [place];Living will  Does patient want to make changes to medical advance directive?    No - Patient declined  No - Patient declined   Copy of Healthcare Power of Attorney in Chart?    No - copy requested No - copy requested No - copy requested     Current Medications (verified) Outpatient Encounter Medications as of 10/19/2022  Medication Sig   acetaminophen (TYLENOL) 500 MG tablet Take 500-1,000 mg by mouth daily as needed for mild pain or headache.   apixaban (ELIQUIS) 5 MG TABS tablet Take 1 tablet (5 mg total) by mouth 2 (two) times daily.   ascorbic acid (VITAMIN C) 500 MG tablet Take 500  mg by mouth 2 (two) times a week.   carvedilol (COREG) 3.125 MG tablet Take 1 tablet (3.125 mg total) by mouth 2 (two) times daily with a meal.   Cholecalciferol (VITAMIN D-3) 25 MCG (1000 UT) CAPS Take 1,000 Units by mouth daily.   dicyclomine (BENTYL) 10 MG capsule TAKE 1 CAPSULE (10 MG TOTAL) BY MOUTH EVERY 6 (SIX) HOURS AS NEEDED (ABDOMINAL PAIN).   fexofenadine (ALLEGRA) 180 MG tablet Take 180 mg by mouth daily as needed for allergies or rhinitis.    fluticasone (FLONASE) 50 MCG/ACT nasal spray Place 1 spray into both nostrils daily as needed for allergies.   gabapentin (NEURONTIN) 300 MG capsule Take 1 capsule (300 mg total) by mouth 3 (three) times daily. (Patient taking differently: Take 300 mg by mouth 2 (two) times daily.)   hydrALAZINE (APRESOLINE) 25 MG tablet Take 25 mg by mouth 3 (three) times daily as needed (As need when BP is 160).   hydrocortisone (ANUSOL-HC) 2.5 % rectal cream Place 1 application rectally 3 (three) times daily as needed for hemorrhoids.   Hypromellose (ARTIFICIAL TEARS OP) Place 1 drop into both eyes 4 (four) times daily as needed (dry eyes).   ipratropium (ATROVENT) 0.06 %  nasal spray SPRAY 2 SPRAYS INTO EACH NOSTRIL 4 TIMES A DAY AS NEEDED FOR RHINITIS   levocetirizine (XYZAL) 5 MG tablet Take 1 tablet (5 mg total) by mouth as needed.   melatonin 5 MG TABS Take 5 mg by mouth at bedtime as needed (sleep).   nitroGLYCERIN (NITROSTAT) 0.4 MG SL tablet Place 1 tablet (0.4 mg total) under the tongue every 5 (five) minutes as needed for chest pain.   rosuvastatin (CRESTOR) 10 MG tablet TAKE 1 TABLET BY MOUTH EVERY DAY   sacubitril-valsartan (ENTRESTO) 97-103 MG Take 1 tablet by mouth 2 (two) times daily.   spironolactone (ALDACTONE) 25 MG tablet TAKE 2 TABLETS BY MOUTH EVERY DAY   Tiotropium Bromide Monohydrate (SPIRIVA RESPIMAT) 2.5 MCG/ACT AERS INHALE 2 PUFFS BY MOUTH INTO THE LUNGS DAILY   triamcinolone cream (KENALOG) 0.1 % Apply 1 application topically daily as  needed (to affected areas, for itching).    Wheat Dextrin (BENEFIBER DRINK MIX PO) Take 1 Dose by mouth every morning. 1 dose = 2 tablespoons   [DISCONTINUED] doxycycline (VIBRA-TABS) 100 MG tablet Take 1 tablet (100 mg total) by mouth 2 (two) times daily. (Patient not taking: Reported on 10/19/2022)   No facility-administered encounter medications on file as of 10/19/2022.    Allergies (verified) Lipitor [atorvastatin] and Other   History: Past Medical History:  Diagnosis Date   Adenomatous colon polyp    Allergic rhinitis    Anemia    Anxiety    Bladder cancer (HCC)    currently on bcg treatments   BPH (benign prostatic hyperplasia)    CAD (coronary artery disease)    sees Dr. Nanetta Batty    Carotid artery disease [facility])    CHF (congestive heart failure) (HCC)    Chronic kidney disease    Sones. Prostate cancer   Clotting disorder (HCC)    CVA (cerebral infarction)    Diabetes mellitus    "Borderline"   Diverticulitis    Dysrhythmia    atrial fib - hx of    Edema    recent swelling in feet and ankles    GERD (gastroesophageal reflux disease)    history   Headache    Hemorrhoids    History of kidney stones    HTN (hypertension)    Hx of radiation therapy    for prostate - 2013    Hyperlipidemia LDL goal <70    IBS (irritable bowel syndrome)    Low back pain    Pre-diabetes    Prostate cancer (HCC) 2008   seed implantation   Sleep apnea    sitting in recliner helps him to breathe per pt , never used cpap sleep study - pt reports could not go to sleep- 6 years ago    Smoker    Stroke [facility])    hx of mini strokes years ago    Past Surgical History:  Procedure Laterality Date   APPENDECTOMY     CARDIAC CATHETERIZATION N/A 01/16/2016   Procedure: Left Heart Cath and Coronary Angiography;  Surgeon: Corky Crafts, MD;  Location: [facility] INVASIVE CV LAB;  Service: Cardiovascular;  Laterality: N/A;   CARDIAC CATHETERIZATION N/A 01/16/2016   Procedure: Coronary Stent  Intervention;  Surgeon: Corky Crafts, MD;  Location: [facility] INVASIVE CV LAB;  Service: Cardiovascular;  Laterality: N/A;   CARDIOVERSION N/A 11/26/2017   Procedure: CARDIOVERSION;  Surgeon: Chrystie Nose, MD;  Location: [facility] ENDOSCOPY;  Service: Cardiovascular;  Laterality: N/A;   CARDIOVERSION N/A 03/13/2018  Procedure: CARDIOVERSION;  Surgeon: Chrystie Nose, MD;  Location: [facility] ENDOSCOPY;  Service: Cardiovascular;  Laterality: N/A;   CARDIOVERSION N/A 07/31/2022   Procedure: CARDIOVERSION;  Surgeon: Lewayne Bunting, MD;  Location: [facility] ENDOSCOPY;  Service: Cardiovascular;  Laterality: N/A;   CARDIOVERSION N/A 09/10/2022   Procedure: CARDIOVERSION;  Surgeon: Pricilla Riffle, MD;  Location: [facility] INVASIVE CV LAB;  Service: Cardiovascular;  Laterality: N/A;   CATARACT EXTRACTION W/ INTRAOCULAR LENS  IMPLANT, BILATERAL Bilateral    COLONOSCOPY  10/21/2008   diverticulosis, radiation proctitis, internal hemorrhoids   CORONARY ANGIOPLASTY     CORONARY STENT INTERVENTION N/A 12/26/2017   Procedure: CORONARY STENT INTERVENTION;  Surgeon: Runell Gess, MD;  Location: [facility] INVASIVE CV LAB;  Service: Cardiovascular;  Laterality: N/A;   ENDARTERECTOMY  11/12/2011   Procedure: ENDARTERECTOMY CAROTID;  Surgeon: Larina Earthly, MD;  Location: [facility] OR;  Service: Vascular;  Laterality: Right;   ESOPHAGOGASTRODUODENOSCOPY (EGD) WITH ESOPHAGEAL DILATION     HEMORRHOID BANDING     LEFT HEART CATH AND CORONARY ANGIOGRAPHY N/A 12/26/2017   Procedure: LEFT HEART CATH AND CORONARY ANGIOGRAPHY;  Surgeon: Runell Gess, MD;  Location: [facility] INVASIVE CV LAB;  Service: Cardiovascular;  Laterality: N/A;   LITHOTRIPSY  X 3   LUMBAR LAMINECTOMY     TRANSURETHRAL RESECTION OF BLADDER TUMOR WITH MITOMYCIN-C Bilateral 01/06/2021   Procedure: TRANSURETHRAL RESECTION OF BLADDER TUMOR WITH GEMCITABINE BILATERAL RETROGRADES;  Surgeon: Bjorn Pippin, MD;  Location: [facility] ORS;  Service: Urology;  Laterality: Bilateral;   TRANSURETHRAL RESECTION  OF PROSTATE  10/2003   TRANSURETHRAL RESECTION OF PROSTATE N/A 05/31/2020   Procedure: CYSTOSCOPY TRANSURETHRAL RESECTION BIOPSY AND FULGURATION OF PROSTATE LESION  (TURP);  Surgeon: Bjorn Pippin, MD;  Location: [facility] ORS;  Service: Urology;  Laterality: N/A;   Family History  Problem Relation Age of Onset   Heart disease Father    Hypertension Mother    Asthma Paternal Grandmother    Colon cancer Neg Hx    Esophageal cancer Neg Hx    Stomach cancer Neg Hx    Rectal cancer Neg Hx    Liver cancer Neg Hx    Social History   Socioeconomic History   Marital status: Married    Spouse name: Not on file   Number of children: 2   Years of education: Not on file   Highest education level: Not on file  Occupational History   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) [employer]   Occupation: Retired  Tobacco Use   Smoking status: Former    Packs/day: 1.50    Years: 60.00    Additional pack years: 0.00    Total pack years: 90.00    Types: Cigarettes    Quit date: 07/25/2008    Years since quitting: 14.2   Smokeless tobacco: Never  Vaping Use   Vaping Use: Never used  Substance and Sexual Activity   Alcohol use: Not Currently   Drug use: No   Sexual activity: Not Currently  Other Topics Concern   Not on file  Social History Narrative   Married second time   Retired [employer] n41 yrs   2 daughters and 2 stepdaughters   Social Determinants of Health   Financial Resource Strain: Low Risk  (10/19/2022)   Overall Financial Resource Strain (CARDIA)    Difficulty of Paying Living Expenses: Not hard at all  Food Insecurity: No Food Insecurity (10/19/2022)   Hunger Vital Sign    Worried About Running Out of Food in the  Last Year: Never true    Ran Out of Food in the Last Year: Never true  Transportation Needs: No Transportation Needs (10/19/2022)   PRAPARE - Administrator, Civil Service (Medical): No    Lack of Transportation (Non-Medical): No  Physical Activity: Insufficiently  Active (10/19/2022)   Exercise Vital Sign    Days of Exercise per Week: 4 days    Minutes of Exercise per Session: 30 min  Stress: No Stress Concern Present (10/19/2022)   [employer] of Occupational Health - Occupational Stress Questionnaire    Feeling of Stress : Only a little  Social Connections: Moderately Isolated (10/19/2022)   Social Connection and Isolation Panel [NHANES]    Frequency of Communication with Friends and Family: More than three times a week    Frequency of Social Gatherings with Friends and Family: More than three times a week    Attends Religious Services: Never    Database administrator or Organizations: No    Attends Engineer, structural: Never    Marital Status: Married    Tobacco Counseling Counseling given: No   Clinical Intake:  Pre-visit preparation completed: No  Pain : No/denies pain     Nutritional Status: BMI of 19-24  Normal Nutritional Risks: None Diabetes: Yes CBG done?: No Did pt. bring in CBG monitor from home?: No  How often do you need to have someone help you when you read instructions, pamphlets, or other written materials from your doctor or pharmacy?: 1 - Never  Diabetic?Nutrition Risk Assessment:  Has the patient had any N/V/D within the last 2 months?  No  Does the patient have any non-healing wounds?  No  Has the patient had any unintentional weight loss or weight gain?  No   Diabetes:  Is the patient diabetic?  Yes  If diabetic, was a CBG obtained today?  No  Did the patient bring in their glucometer from home?  No  How often do you monitor your CBG's? Never .   Financial Strains and Diabetes Management:  Are you having any financial strains with the device, your supplies or your medication? No .  Does the patient want to be seen by Chronic Care Management for management of their diabetes?  No  Would the patient like to be referred to a Nutritionist or for Diabetic Management?  No   Diabetic  Exams:  Diabetic Eye Exam: Completed 08/28/2022 Diabetic Foot Exam: Overdue, Pt has been advised about the importance in completing this exam. Pt is scheduled for diabetic foot exam on at next diabetic appointment .   Interpreter Needed?: No  Information entered by :: Laurel Dimmer, CMA   Activities of Daily Living    10/19/2022   11:19 AM 09/10/2022    8:23 AM  In your present state of health, do you have any difficulty performing the following activities:  Hearing? 1 1  Comment bilateral hearing aid hearing aids  Vision? 1 0  Comment Dr. Jackey Loge   Difficulty concentrating or making decisions? 0 0  Walking or climbing stairs? 0 0  Dressing or bathing? 0 0  Doing errands, shopping? 0     Patient Care Team: Etta Grandchild, MD as PCP - General (Internal Medicine) Runell Gess, MD as PCP - Cardiology (Cardiology) [name], Vinnie Level, [facility] (Inactive) (Pharmacist)  Indicate any recent Medical Services you may have received from other than [facility] providers in the past year (date may be approximate).     Assessment:  This is a routine wellness examination for Regniald.  Hearing/Vision screen Admits to hearing loss, bilateral hearing aids  Denies any change to her vision. Wear glasses.  Annual Eye Exam.   Dietary issues and exercise activities discussed: Current Exercise Habits: Structured exercise class, Type of exercise: walking, Time (Minutes): 40, Frequency (Times/Week): 4, Weekly Exercise (Minutes/Week): 160, Intensity: Moderate, Exercise limited by: None identified   Goals Addressed   None    Depression Screen    10/19/2022   11:18 AM 05/25/2022    3:21 PM 03/19/2022    8:21 AM 02/08/2022    3:32 PM 12/25/2021    8:04 AM 09/25/2021   11:36 AM 09/13/2021    8:21 AM  PHQ 2/9 Scores  PHQ - 2 Score 0 0 0 0 0 0 0  PHQ- 9 Score  0 0 0 0      Fall Risk    10/19/2022   11:19 AM 05/25/2022    3:21 PM 03/19/2022    8:21 AM 02/08/2022    3:32 PM 12/25/2021    8:04 AM   Fall Risk   Falls in the past year? 0 0 0 0 0  Number falls in past yr: 0 0 0 0 0  Injury with Fall? 0 0 0 0 0  Risk for fall due to : No Fall Risks No Fall Risks No Fall Risks No Fall Risks No Fall Risks  Follow up Falls evaluation completed Falls evaluation completed Falls evaluation completed Falls evaluation completed Falls evaluation completed    FALL RISK PREVENTION PERTAINING TO THE HOME:  Any stairs in or around the home? No  If so, are there any without handrails? No  Home free of loose throw rugs in walkways, pet beds, electrical cords, etc? Yes  Adequate lighting in your home to reduce risk of falls? Yes   ASSISTIVE DEVICES UTILIZED TO PREVENT FALLS:  Life alert? No  Use of a cane, walker or w/c? No  Grab bars in the bathroom? Yes  Shower chair or bench in shower? No  Elevated toilet seat or a handicapped toilet? Yes   TIMED UP AND GO:  Was the test performed? Unable to perform, virtual appointment   Cognitive Function:        10/19/2022   11:22 AM 09/25/2021   11:40 AM  6CIT Screen  What Year? 0 points 0 points  What month? 0 points 0 points  What time? 0 points 0 points  Count back from 20 2 points 0 points  Months in reverse 0 points 0 points  Repeat phrase 2 points 0 points  Total Score 4 points 0 points    Immunizations Immunization History  Administered Date(s) Administered   Fluad Quad(high Dose 65+) 02/27/2019, 02/23/2020, 03/07/2021, 03/02/2022   Influenza Split 03/09/2011, 03/06/2012, 03/11/2013   Influenza Whole 03/15/2009, 03/17/2010   Influenza, High Dose Seasonal PF 03/08/2016, 03/19/2017, 03/19/2018   Influenza,inj,Quad PF,6+ Mos 03/19/2014   Influenza-Unspecified 02/26/2015   PFIZER Comirnaty(Gray Top)Covid-19 Tri-Sucrose Vaccine 09/13/2020   PFIZER(Purple Top)SARS-COV-2 Vaccination 06/07/2019, 06/28/2019   Pneumococcal Conjugate-13 08/15/2015   Pneumococcal Polysaccharide-23 05/07/2011, 02/23/2020   Tdap 05/06/2012   Zoster  Recombinat (Shingrix) 01/23/2019, 04/14/2019   Zoster, Live 08/20/2012    TDAP status: Due, Education has been provided regarding the importance of this vaccine. Advised may receive this vaccine at local pharmacy or Health Dept. Aware to provide a copy of the vaccination record if obtained from local pharmacy or Health Dept. Verbalized acceptance and understanding.  Flu Vaccine  status: Up to date  Pneumococcal vaccine status: Up to date  Covid-19 vaccine status: Information provided on how to obtain vaccines.   Qualifies for Shingles Vaccine? Yes   Zostavax completed Yes   Shingrix Completed?: Yes  Screening Tests Health Maintenance  Topic Date Due   COVID-19 Vaccine (4 - 2023-24 season) 01/26/2022   DTaP/Tdap/Td (2 - Td or Tdap) 05/06/2022   FOOT EXAM  08/25/2022   INFLUENZA VACCINE  12/27/2022   OPHTHALMOLOGY EXAM  08/28/2023   Medicare Annual Wellness (AWV)  10/19/2023   Pneumonia Vaccine 17+ Years old  Completed   Zoster Vaccines- Shingrix  Completed   HPV VACCINES  Aged Out    Health Maintenance  Health Maintenance Due  Topic Date Due   COVID-19 Vaccine (4 - 2023-24 season) 01/26/2022   DTaP/Tdap/Td (2 - Td or Tdap) 05/06/2022   FOOT EXAM  08/25/2022    Colorectal cancer screening: No longer required.   Lung Cancer Screening: (Low Dose CT Chest recommended if Age 65-80 years, 30 pack-year currently smoking OR have quit w/in 15years.) does not qualify.   Lung Cancer Screening Referral: not applicable   Additional Screening:  Hepatitis C Screening: does not qualify;  Vision Screening: Recommended annual ophthalmology exams for early detection of glaucoma and other disorders of the eye. Is the patient up to date with their annual eye exam?  Yes  Who is the provider or what is the name of the office in which the patient attends annual eye exams?  If pt is not established with a provider, would they like to be referred to a provider to establish care? No .    Dental Screening: Recommended annual dental exams for proper oral hygiene  Community Resource Referral / Chronic Care Management: CRR required this visit?  No   CCM required this visit?  No      Plan:     I have personally reviewed and noted the following in the patient's chart:   Medical and social history Use of alcohol, tobacco or illicit drugs  Current medications and supplements including opioid prescriptions. Patient is not currently taking opioid prescriptions. Functional ability and status Nutritional status Physical activity Advanced directives List of other physicians Hospitalizations, surgeries, and ER visits in previous 12 months Vitals Screenings to include cognitive, depression, and falls Referrals and appointments  In addition, I have reviewed and discussed with patient certain preventive protocols, quality metrics, and best practice recommendations. A written personalized care plan for preventive services as well as general preventive health recommendations were provided to patient.   Mr. Gagen , Thank you for taking time to come for your Medicare Wellness Visit. I appreciate your ongoing commitment to your health goals. Please review the following plan we discussed and let me know if I can assist you in the future.   These are the goals we discussed:  Goals       Manage My Medicine      Timeframe:  Long-Range Goal Priority:  Medium Start Date:      10/03/20                       Expected End Date: 10/04/22                      Follow Up Date 03/2022   - call for medicine refill 2 or 3 days before it runs out - call if I am sick and can't take my medicine -  keep a list of all the medicines I take; vitamins and herbals too - use a pillbox to sort medicine    Why is this important?   These steps will help you keep on track with your medicines.   Notes:       Stay Healthy (pt-stated)      Try to stay healthy.        This is a list of the  screening recommended for you and due dates:  Health Maintenance  Topic Date Due   COVID-19 Vaccine (4 - 2023-24 season) 01/26/2022   DTaP/Tdap/Td vaccine (2 - Td or Tdap) 05/06/2022   Complete foot exam   08/25/2022   Flu Shot  12/27/2022   Eye exam for diabetics  08/28/2023   Medicare Annual Wellness Visit  10/19/2023   Pneumonia Vaccine  Completed   Zoster (Shingles) Vaccine  Completed   HPV Vaccine  Aged [address], [place]   10/19/2022   Nurse Notes: Approximately 30 minute Non-Face -To-Face Medicare Wellness Visit

## 2022-10-24 DIAGNOSIS — N3041 Irradiation cystitis with hematuria: Secondary | ICD-10-CM | POA: Diagnosis not present

## 2022-10-24 DIAGNOSIS — Z8551 Personal history of malignant neoplasm of bladder: Secondary | ICD-10-CM | POA: Diagnosis not present

## 2022-10-24 DIAGNOSIS — N210 Calculus in bladder: Secondary | ICD-10-CM | POA: Diagnosis not present

## 2022-10-31 ENCOUNTER — Other Ambulatory Visit: Payer: Self-pay | Admitting: Internal Medicine

## 2022-10-31 DIAGNOSIS — J310 Chronic rhinitis: Secondary | ICD-10-CM

## 2022-10-31 DIAGNOSIS — H9313 Tinnitus, bilateral: Secondary | ICD-10-CM | POA: Diagnosis not present

## 2022-10-31 DIAGNOSIS — H938X1 Other specified disorders of right ear: Secondary | ICD-10-CM | POA: Diagnosis not present

## 2022-10-31 DIAGNOSIS — H903 Sensorineural hearing loss, bilateral: Secondary | ICD-10-CM | POA: Diagnosis not present

## 2022-10-31 DIAGNOSIS — H6991 Unspecified Eustachian tube disorder, right ear: Secondary | ICD-10-CM | POA: Diagnosis not present

## 2022-11-09 DIAGNOSIS — H04123 Dry eye syndrome of bilateral lacrimal glands: Secondary | ICD-10-CM | POA: Diagnosis not present

## 2022-11-09 DIAGNOSIS — H26491 Other secondary cataract, right eye: Secondary | ICD-10-CM | POA: Diagnosis not present

## 2022-11-22 ENCOUNTER — Ambulatory Visit (INDEPENDENT_AMBULATORY_CARE_PROVIDER_SITE_OTHER): Payer: PPO | Admitting: Emergency Medicine

## 2022-11-22 ENCOUNTER — Encounter: Payer: Self-pay | Admitting: Emergency Medicine

## 2022-11-22 VITALS — BP 138/88 | HR 70 | Temp 98.0°F | Ht 69.0 in | Wt 181.0 lb

## 2022-11-22 DIAGNOSIS — R0981 Nasal congestion: Secondary | ICD-10-CM | POA: Insufficient documentation

## 2022-11-22 DIAGNOSIS — R519 Headache, unspecified: Secondary | ICD-10-CM

## 2022-11-22 DIAGNOSIS — J0100 Acute maxillary sinusitis, unspecified: Secondary | ICD-10-CM

## 2022-11-22 MED ORDER — FLUTICASONE PROPIONATE 50 MCG/ACT NA SUSP
2.0000 | Freq: Every day | NASAL | 6 refills | Status: DC
Start: 2022-11-22 — End: 2022-12-20

## 2022-11-22 MED ORDER — AZITHROMYCIN 250 MG PO TABS
ORAL_TABLET | ORAL | 0 refills | Status: DC
Start: 2022-11-22 — End: 2022-12-20

## 2022-11-22 NOTE — Assessment & Plan Note (Signed)
May take Tylenol and or Advil for headache Secondary to sinus infection and congestion Will start antibiotic today Recommend daily Flonase and frequent saline nasal spray applications

## 2022-11-22 NOTE — Progress Notes (Signed)
Dillon Young 89 y.o.   Chief Complaint  Patient presents with   Headache    Patient states he is having some sinus pressure, pain around his eyes. Taking OTC Zyrtec     HISTORY OF PRESENT ILLNESS: Acute problem visit today.  Patient of Dr. [name]. This is a 89 y.o. male complaining of sinus pressure and headache for the past 1 to 2 weeks.  Progressively getting worse. No other associated symptoms No other complaints or medical concerns today.  Headache  Pertinent negatives include no abdominal pain, coughing, fever, nausea or vomiting.     Prior to Admission medications   Medication Sig Start Date End Date Taking? Authorizing Provider  acetaminophen (TYLENOL) 500 MG tablet Take 500-1,000 mg by mouth daily as needed for mild pain or headache.   Yes [provider]  apixaban (ELIQUIS) 5 MG TABS tablet Take 1 tablet (5 mg total) by mouth 2 (two) times daily. 05/29/22  Yes Runell Gess, MD  ascorbic acid (VITAMIN C) 500 MG tablet Take 500 mg by mouth 2 (two) times a week.   Yes [provider]  azithromycin (ZITHROMAX) 250 MG tablet Sig as indicated 11/22/22  Yes Menaal Russum, Eilleen Kempf, MD  carvedilol (COREG) 3.125 MG tablet Take 1 tablet (3.125 mg total) by mouth 2 (two) times daily with a meal. 09/27/22  Yes Eustace Pen, PA-C  Cholecalciferol (VITAMIN D-3) 25 MCG (1000 UT) CAPS Take 1,000 Units by mouth daily.   Yes [provider]  dicyclomine (BENTYL) 10 MG capsule TAKE 1 CAPSULE (10 MG TOTAL) BY MOUTH EVERY 6 (SIX) HOURS AS NEEDED (ABDOMINAL PAIN). 03/16/22  Yes Esterwood, Amy S, PA-C  fexofenadine (ALLEGRA) 180 MG tablet Take 180 mg by mouth daily as needed for allergies or rhinitis.    Yes [provider]  fluticasone (FLONASE) 50 MCG/ACT nasal spray Place 2 sprays into both nostrils daily. 11/22/22  Yes Jodilyn Giese, Eilleen Kempf, MD  gabapentin (NEURONTIN) 300 MG capsule Take 1 capsule (300 mg total) by mouth 3 (three) times  daily. Patient taking differently: Take 300 mg by mouth 2 (two) times daily. 06/27/22  Yes Etta Grandchild, MD  hydrALAZINE (APRESOLINE) 25 MG tablet Take 25 mg by mouth 3 (three) times daily as needed (As need when BP is 160).   Yes [provider]  hydrocortisone (ANUSOL-HC) 2.5 % rectal cream Place 1 application rectally 3 (three) times daily as needed for hemorrhoids. 02/21/21  Yes Iva Boop, MD  Hypromellose (ARTIFICIAL TEARS OP) Place 1 drop into both eyes 4 (four) times daily as needed (dry eyes).   Yes [provider]  ipratropium (ATROVENT) 0.06 % nasal spray SPRAY 2 SPRAYS INTO EACH NOSTRIL 4 TIMES A DAY AS NEEDED FOR RHINITIS 10/31/22  Yes Etta Grandchild, MD  levocetirizine (XYZAL) 5 MG tablet Take 1 tablet (5 mg total) by mouth as needed. 08/07/22  Yes Newman Nip, NP  melatonin 5 MG TABS Take 5 mg by mouth at bedtime as needed (sleep).   Yes [provider]  nitroGLYCERIN (NITROSTAT) 0.4 MG SL tablet Place 1 tablet (0.4 mg total) under the tongue every 5 (five) minutes as needed for chest pain. 10/18/21  Yes Laurey Morale, MD  rosuvastatin (CRESTOR) 10 MG tablet TAKE 1 TABLET BY MOUTH EVERY DAY 10/04/22  Yes Etta Grandchild, MD  sacubitril-valsartan (ENTRESTO) 97-103 MG Take 1 tablet by mouth 2 (two) times daily. 05/14/22  Yes Runell Gess, MD  spironolactone (ALDACTONE)  25 MG tablet TAKE 2 TABLETS BY MOUTH EVERY DAY 12/14/21  Yes Laurey Morale, MD  Tiotropium Bromide Monohydrate (SPIRIVA RESPIMAT) 2.5 MCG/ACT AERS INHALE 2 PUFFS BY MOUTH INTO THE LUNGS DAILY 09/03/22  Yes Martina Sinner, MD  triamcinolone cream (KENALOG) 0.1 % Apply 1 application topically daily as needed (to affected areas, for itching).  01/06/19  Yes [provider]  Wheat Dextrin (BENEFIBER DRINK MIX PO) Take 1 Dose by mouth every morning. 1 dose = 2 tablespoons   Yes [provider]    Allergies  Allergen Reactions   Lipitor [Atorvastatin] Other (See  Comments)    Muscle pain   Other Rash and Other (See Comments)    Detergent- Rashes on the skin    Patient Active Problem List   Diagnosis Date Noted   Hypercoagulable state due to permanent atrial fibrillation (HCC) 09/12/2022   Diabetic neuropathy, painful (HCC) 06/27/2022   Stage 3b chronic kidney disease (HCC) 06/27/2022   Bladder cancer (HCC) 01/10/2022   Left renal stone 08/24/2021   Panlobular emphysema (HCC) 06/08/2021   Laryngitis, chronic 04/18/2021   Encounter for general adult medical examination with abnormal findings 03/10/2021   Vitamin B12 deficiency anemia due to intrinsic factor deficiency 03/08/2021   Iron deficiency anemia secondary to inadequate dietary iron intake 03/08/2021   Intrinsic eczema 01/12/2019   CAD (coronary artery disease) 12/26/2017   Left ventricular dysfunction 11/06/2017   Persistent atrial fibrillation 10/02/2017   Aortic atherosclerosis (HCC) 05/02/2017   Primary osteoarthritis of both knees 03/13/2016   Bradycardia 12/15/2015   Middle insomnia 08/15/2015   Carotid artery disease- s/p RCE June 2013 11/01/2011   Radiation proctitis 03/30/2011   Cerebrovascular disease 09/08/2008   Allergic rhinitis 09/08/2008   Diabetes mellitus without complication (HCC) 09/08/2008   Prostate cancer (HCC) 09/06/2008    Class: History of   Hyperlipidemia with target LDL less than 70 07/02/2008   Essential hypertension 07/02/2008   GERD 07/02/2008   Irritable bowel syndrome 07/02/2008   LOW BACK PAIN SYNDROME 07/02/2008    Past Medical History:  Diagnosis Date   Adenomatous colon polyp    Allergic rhinitis    Anemia    Anxiety    Bladder cancer (HCC)    currently on bcg treatments   BPH (benign prostatic hyperplasia)    CAD (coronary artery disease)    sees Dr. Nanetta Batty    Carotid artery disease [facility])    CHF (congestive heart failure) (HCC)    Chronic kidney disease    Sones. Prostate cancer   Clotting disorder (HCC)    CVA (cerebral  infarction)    Diabetes mellitus    "Borderline"   Diverticulitis    Dysrhythmia    atrial fib - hx of    Edema    recent swelling in feet and ankles    GERD (gastroesophageal reflux disease)    history   Headache    Hemorrhoids    History of kidney stones    HTN (hypertension)    Hx of radiation therapy    for prostate - 2013    Hyperlipidemia LDL goal <70    IBS (irritable bowel syndrome)    Low back pain    Pre-diabetes    Prostate cancer (HCC) 2008   seed implantation   Sleep apnea    sitting in recliner helps him to breathe per pt , never used cpap sleep study - pt reports could not go to sleep- 6 years ago  Smoker    Stroke [facility])    hx of mini strokes years ago     Past Surgical History:  Procedure Laterality Date   APPENDECTOMY     CARDIAC CATHETERIZATION N/A 01/16/2016   Procedure: Left Heart Cath and Coronary Angiography;  Surgeon: Corky Crafts, MD;  Location[facility] INVASIVE CV LAB;  Service: Cardiovascular;  Laterality: N/A;   CARDIAC CATHETERIZATION N/A 01/16/2016   Procedure: Coronary Stent Intervention;  Surgeon: Corky Crafts, MD;  Location: [facility] INVASIVE CV LAB;  Service: Cardiovascular;  Laterality: N/A;   CARDIOVERSION N/A 11/26/2017   Procedure: CARDIOVERSION;  Surgeon: Chrystie Nose, MD;  Location: [facility] ENDOSCOPY;  Service: Cardiovascular;  Laterality: N/A;   CARDIOVERSION N/A 03/13/2018   Procedure: CARDIOVERSION;  Surgeon: Chrystie Nose, MD;  Location: [facility] ENDOSCOPY;  Service: Cardiovascular;  Laterality: N/A;   CARDIOVERSION N/A 07/31/2022   Procedure: CARDIOVERSION;  Surgeon: Lewayne Bunting, MD;  Location: [facility] ENDOSCOPY;  Service: Cardiovascular;  Laterality: N/A;   CARDIOVERSION N/A 09/10/2022   Procedure: CARDIOVERSION;  Surgeon: Pricilla Riffle, MD;  Location: [facility] INVASIVE CV LAB;  Service: Cardiovascular;  Laterality: N/A;   CATARACT EXTRACTION W/ INTRAOCULAR LENS  IMPLANT, BILATERAL Bilateral    COLONOSCOPY  10/21/2008   diverticulosis,  radiation proctitis, internal hemorrhoids   CORONARY ANGIOPLASTY     CORONARY STENT INTERVENTION N/A 12/26/2017   Procedure: CORONARY STENT INTERVENTION;  Surgeon: Runell Gess, MD;  Location: [facility] INVASIVE CV LAB;  Service: Cardiovascular;  Laterality: N/A;   ENDARTERECTOMY  11/12/2011   Procedure: ENDARTERECTOMY CAROTID;  Surgeon: Larina Earthly, MD;  Location: [facility] OR;  Service: Vascular;  Laterality: Right;   ESOPHAGOGASTRODUODENOSCOPY (EGD) WITH ESOPHAGEAL DILATION     HEMORRHOID BANDING     LEFT HEART CATH AND CORONARY ANGIOGRAPHY N/A 12/26/2017   Procedure: LEFT HEART CATH AND CORONARY ANGIOGRAPHY;  Surgeon: Runell Gess, MD;  Location: [facility] INVASIVE CV LAB;  Service: Cardiovascular;  Laterality: N/A;   LITHOTRIPSY  X 3   LUMBAR LAMINECTOMY     TRANSURETHRAL RESECTION OF BLADDER TUMOR WITH MITOMYCIN-C Bilateral 01/06/2021   Procedure: TRANSURETHRAL RESECTION OF BLADDER TUMOR WITH GEMCITABINE BILATERAL RETROGRADES;  Surgeon: Bjorn Pippin, MD;  Location: [facility] ORS;  Service: Urology;  Laterality: Bilateral;   TRANSURETHRAL RESECTION OF PROSTATE  10/2003   TRANSURETHRAL RESECTION OF PROSTATE N/A 05/31/2020   Procedure: CYSTOSCOPY TRANSURETHRAL RESECTION BIOPSY AND FULGURATION OF PROSTATE LESION  (TURP);  Surgeon: Bjorn Pippin, MD;  Location: [facility] ORS;  Service: Urology;  Laterality: N/A;    Social History   Socioeconomic History   Marital status: Married    Spouse name: Not on file   Number of children: 2   Years of education: Not on file   Highest education level: Not on file  Occupational History   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) [employer]   Occupation: Retired  Tobacco Use   Smoking status: Former    Packs/day: 1.50    Years: 60.00    Additional pack years: 0.00    Total pack years: 90.00    Types: Cigarettes    Quit date: 07/25/2008    Years since quitting: 14.3   Smokeless tobacco: Never  Vaping Use   Vaping Use: Never used  Substance and Sexual Activity   Alcohol use: Not Currently    Drug use: No   Sexual activity: Not Currently  Other Topics Concern   Not on file  Social History Narrative   Married second time   Retired [employer]  n39 yrs   2 daughters and 2 stepdaughters   Social Determinants of Health   Financial Resource Strain: Low Risk  (10/19/2022)   Overall Financial Resource Strain (CARDIA)    Difficulty of Paying Living Expenses: Not hard at all  Food Insecurity: No Food Insecurity (10/19/2022)   Hunger Vital Sign    Worried About Running Out of Food in the Last Year: Never true    Ran Out of Food in the Last Year: Never true  Transportation Needs: No Transportation Needs (10/19/2022)   PRAPARE - Administrator, Civil Service (Medical): No    Lack of Transportation (Non-Medical): No  Physical Activity: Insufficiently Active (10/19/2022)   Exercise Vital Sign    Days of Exercise per Week: 4 days    Minutes of Exercise per Session: 30 min  Stress: No Stress Concern Present (10/19/2022)   [employer] of Occupational Health - Occupational Stress Questionnaire    Feeling of Stress : Only a little  Social Connections: Moderately Isolated (10/19/2022)   Social Connection and Isolation Panel [NHANES]    Frequency of Communication with Friends and Family: More than three times a week    Frequency of Social Gatherings with Friends and Family: More than three times a week    Attends Religious Services: Never    Database administrator or Organizations: No    Attends Banker Meetings: Never    Marital Status: Married  Catering manager Violence: Not At Risk (10/19/2022)   Humiliation, Afraid, Rape, and Kick questionnaire    Fear of Current or Ex-Partner: No    Emotionally Abused: No    Physically Abused: No    Sexually Abused: No    Family History  Problem Relation Age of Onset   Heart disease Father    Hypertension Mother    Asthma Paternal Grandmother    Colon cancer Neg Hx    Esophageal cancer Neg Hx     Stomach cancer Neg Hx    Rectal cancer Neg Hx    Liver cancer Neg Hx      Review of Systems  Constitutional: Negative.  Negative for chills and fever.  HENT:  Positive for congestion.   Respiratory: Negative.  Negative for cough and shortness of breath.   Cardiovascular: Negative.  Negative for chest pain and palpitations.  Gastrointestinal:  Negative for abdominal pain, diarrhea, nausea and vomiting.  Genitourinary: Negative.  Negative for dysuria and hematuria.  Skin: Negative.  Negative for rash.  Neurological:  Positive for headaches.    Vitals:   11/22/22 1453  BP: 138/88  Pulse: 70  Temp: 98 F (36.7 C)  SpO2: 97%    Physical Exam Vitals reviewed.  Constitutional:      Appearance: He is well-developed.  HENT:     Head: Normocephalic.     Nose: Congestion present.     Right Sinus: Maxillary sinus tenderness present.     Left Sinus: Maxillary sinus tenderness present.     Mouth/Throat:     Mouth: Mucous membranes are moist.     Pharynx: Oropharynx is clear.  Eyes:     Extraocular Movements: Extraocular movements intact.     Conjunctiva/sclera: Conjunctivae normal.     Pupils: Pupils are equal, round, and reactive to light.  Cardiovascular:     Rate and Rhythm: Normal rate and regular rhythm.     Pulses: Normal pulses.     Heart sounds: Normal heart sounds.  Pulmonary:     Effort:  Pulmonary effort is normal.     Breath sounds: Normal breath sounds.  Musculoskeletal:     Cervical back: No tenderness.  Lymphadenopathy:     Cervical: No cervical adenopathy.  Skin:    General: Skin is warm and dry.  Neurological:     General: No focal deficit present.     Mental Status: He is alert and oriented to person, place, and time.     Sensory: No sensory deficit.     Motor: No weakness.  Psychiatric:        Mood and Affect: Mood normal.        Behavior: Behavior normal.      ASSESSMENT & PLAN: A total of 32 minutes was spent with the patient and  counseling/coordination of care regarding preparing for this visit, review of most recent office visit notes, review of multiple chronic medical conditions under management, review of all medications, diagnosis of possible sinus infection and need for antibiotics, treatment for sinus congestion and headache, prognosis, documentation, and need for follow-up.  Problem List Items Addressed This Visit       Respiratory   Acute non-recurrent maxillary sinusitis    Has history of allergic rhinitis.  May have secondary bacterial infection now.  Recommend daily azithromycin for 5 days Also recommend Flonase and nasal saline solution      Relevant Medications   azithromycin (ZITHROMAX) 250 MG tablet   fluticasone (FLONASE) 50 MCG/ACT nasal spray   Sinus congestion - Primary    Contributing to sinus headaches. Do not recommend over-the-counter oral decongestants Recommend Flonase and saline nasal sprays Antibiotic may help      Relevant Medications   fluticasone (FLONASE) 50 MCG/ACT nasal spray     Other   Sinus headache    May take Tylenol and or Advil for headache Secondary to sinus infection and congestion Will start antibiotic today Recommend daily Flonase and frequent saline nasal spray applications      Relevant Medications   fluticasone (FLONASE) 50 MCG/ACT nasal spray   Patient Instructions  Sinus Infection, Adult A sinus infection is soreness and swelling (inflammation) of your sinuses. Sinuses are hollow spaces in the bones around your face. They are located: Around your eyes. In the middle of your forehead. Behind your nose. In your cheekbones. Your sinuses and nasal passages are lined with a fluid called mucus. Mucus drains out of your sinuses. Swelling can trap mucus in your sinuses. This lets germs (bacteria, virus, or fungus) grow, which leads to infection. Most of the time, this condition is caused by a virus. What are the causes? Allergies. Asthma. Germs. Things  that block your nose or sinuses. Growths in the nose (nasal polyps). Chemicals or irritants in the air. A fungus. This is rare. What increases the risk? Having a weak body defense system (immune system). Doing a lot of swimming or diving. Using nasal sprays too much. Smoking. What are the signs or symptoms? The main symptoms of this condition are pain and a feeling of pressure around the sinuses. Other symptoms include: Stuffy nose (congestion). This may make it hard to breathe through your nose. Runny nose (drainage). Soreness, swelling, and warmth in the sinuses. A cough that may get worse at night. Being unable to smell and taste. Mucus that collects in the throat or the back of the nose (postnasal drip). This may cause a sore throat or bad breath. Being very tired (fatigued). A fever. How is this diagnosed? Your symptoms. Your medical history. A physical  exam. Tests to find out if your condition is short-term (acute) or long-term (chronic). Your doctor may: Check your nose for growths (polyps). Check your sinuses using a tool that has a light on one end (endoscope). Check for allergies or germs. Do imaging tests, such as an MRI or CT scan. How is this treated? Treatment for this condition depends on the cause and whether it is short-term or long-term. If caused by a virus, your symptoms should go away on their own within 10 days. You may be given medicines to relieve symptoms. They include: Medicines that shrink swollen tissue in the nose. A spray that treats swelling of the nostrils. Rinses that help get rid of thick mucus in your nose (nasal saline washes). Medicines that treat allergies (antihistamines). Over-the-counter pain relievers. If caused by bacteria, your doctor may wait to see if you will get better without treatment. You may be given antibiotic medicine if you have: A very bad infection. A weak body defense system. If caused by growths in the nose, surgery may  be needed. Follow these instructions at home: Medicines Take, use, or apply over-the-counter and prescription medicines only as told by your doctor. These may include nasal sprays. If you were prescribed an antibiotic medicine, take it as told by your doctor. Do not stop taking it even if you start to feel better. Hydrate and humidify  Drink enough water to keep your pee (urine) pale yellow. Use a cool mist humidifier to keep the humidity level in your home above 50%. Breathe in steam for 10-15 minutes, 3-4 times a day, or as told by your doctor. You can do this in the bathroom while a hot shower is running. Try not to spend time in cool or dry air. Rest Rest as much as you can. Sleep with your head raised (elevated). Make sure you get enough sleep each night. General instructions  Put a warm, moist washcloth on your face 3-4 times a day, or as often as told by your doctor. Use nasal saline washes as often as told by your doctor. Wash your hands often with soap and water. If you cannot use soap and water, use hand sanitizer. Do not smoke. Avoid being around people who are smoking (secondhand smoke). Keep all follow-up visits. Contact a doctor if: You have a fever. Your symptoms get worse. Your symptoms do not get better within 10 days. Get help right away if: You have a very bad headache. You cannot stop vomiting. You have very bad pain or swelling around your face or eyes. You have trouble seeing. You feel confused. Your neck is stiff. You have trouble breathing. These symptoms may be an emergency. Get help right away. Call 911. Do not wait to see if the symptoms will go away. Do not drive yourself to the hospital. Summary A sinus infection is swelling of your sinuses. Sinuses are hollow spaces in the bones around your face. This condition is caused by tissues in your nose that become inflamed or swollen. This traps germs. These can lead to infection. If you were prescribed  an antibiotic medicine, take it as told by your doctor. Do not stop taking it even if you start to feel better. Keep all follow-up visits. This information is not intended to replace advice given to you by your health care provider. Make sure you discuss any questions you have with your health care provider. Document Revised: 04/18/2021 Document Reviewed: 04/18/2021 Elsevier Patient Education  2024 [employer].  Agustina Caroli, MD [facility]

## 2022-11-22 NOTE — Assessment & Plan Note (Signed)
Has history of allergic rhinitis.  May have secondary bacterial infection now.  Recommend daily azithromycin for 5 days Also recommend Flonase and nasal saline solution

## 2022-11-22 NOTE — Patient Instructions (Signed)

## 2022-11-22 NOTE — Assessment & Plan Note (Signed)
Contributing to sinus headaches. Do not recommend over-the-counter oral decongestants Recommend Flonase and saline nasal sprays Antibiotic may help

## 2022-11-27 DIAGNOSIS — H26491 Other secondary cataract, right eye: Secondary | ICD-10-CM | POA: Diagnosis not present

## 2022-11-28 ENCOUNTER — Other Ambulatory Visit: Payer: Self-pay | Admitting: Cardiovascular Disease

## 2022-11-28 NOTE — Telephone Encounter (Signed)
Prescription refill request for Eliquis received. Indication:afib  Last office visit: Allyson Sabal 07/18/2022 Scr: 1.40, 09/03/2022 Age: 89 yo  Weight: 82.1 kg   Refill sent.

## 2022-12-07 DIAGNOSIS — H903 Sensorineural hearing loss, bilateral: Secondary | ICD-10-CM | POA: Diagnosis not present

## 2022-12-20 ENCOUNTER — Ambulatory Visit: Payer: PPO | Admitting: Pulmonary Disease

## 2022-12-20 ENCOUNTER — Encounter: Payer: Self-pay | Admitting: Pulmonary Disease

## 2022-12-20 VITALS — BP 130/70 | HR 74 | Temp 97.9°F | Ht 68.0 in | Wt 180.6 lb

## 2022-12-20 DIAGNOSIS — R0981 Nasal congestion: Secondary | ICD-10-CM | POA: Diagnosis not present

## 2022-12-20 DIAGNOSIS — R519 Headache, unspecified: Secondary | ICD-10-CM | POA: Diagnosis not present

## 2022-12-20 DIAGNOSIS — J310 Chronic rhinitis: Secondary | ICD-10-CM | POA: Diagnosis not present

## 2022-12-20 DIAGNOSIS — J439 Emphysema, unspecified: Secondary | ICD-10-CM | POA: Diagnosis not present

## 2022-12-20 MED ORDER — STIOLTO RESPIMAT 2.5-2.5 MCG/ACT IN AERS
2.0000 | INHALATION_SPRAY | Freq: Every day | RESPIRATORY_TRACT | 11 refills | Status: DC
Start: 2022-12-20 — End: 2023-10-07

## 2022-12-20 MED ORDER — IPRATROPIUM BROMIDE 0.06 % NA SOLN
2.0000 | Freq: Two times a day (BID) | NASAL | 1 refills | Status: DC
Start: 2022-12-20 — End: 2024-01-01

## 2022-12-20 MED ORDER — STIOLTO RESPIMAT 2.5-2.5 MCG/ACT IN AERS: 2.0000 | INHALATION_SPRAY | Freq: Every day | RESPIRATORY_TRACT | Status: DC

## 2022-12-20 MED ORDER — SPIRIVA RESPIMAT 2.5 MCG/ACT IN AERS
2.0000 | INHALATION_SPRAY | Freq: Every day | RESPIRATORY_TRACT | 11 refills | Status: DC
Start: 2022-12-20 — End: 2022-12-20

## 2022-12-20 MED ORDER — FLUTICASONE PROPIONATE 50 MCG/ACT NA SUSP
2.0000 | Freq: Every day | NASAL | 6 refills | Status: AC
Start: 2022-12-20 — End: ?

## 2022-12-20 NOTE — Patient Instructions (Addendum)
Start stiolto inhaler 2 puffs daily  Continue fluticasone and ipratropium nasal sprays  Follow up in 6 months, please call sooner if your breathing is not getting better.

## 2022-12-20 NOTE — Progress Notes (Signed)
Synopsis: Referred in February 2023 for emphysema by [name], MD  Subjective:   PATIENT ID: Dillon Young GENDER: male DOB: February 28, 1933, MRN: [id number]  HPI  Chief Complaint  Patient presents with   Follow-up    Pt states his breathing comes and goes and request an inhaler. Pt request Tiotropium Bromide (Spiriva Respimat) refill.   Dillon Young is an 89 year old male, former smoker with history of GERD, CHF, CAD, hypertension and sleep apnea who returns to pulmonary clinic for emphysema.   He reports running out of spiriva inhaler over the past couple of months and is having increase shortness of breath. He has follow up with his heart doctor. He has dyspnea with laying flat. No LE edema. No wheezing.   OV 07/28/21 He reports his breathing is a bit better after the 1 month spiriva sample. He reports his cough is much better. He is complaining of some hoarseness in his voice. He has post-nasal drainage.  Pulmonary function tests today shoe mild obstructive defect.   OV 06/30/21 Patient reports having COVID infection 3 weeks ago with significant cough, mucus production and shortness of breath.  He denied any wheezing or chest tightness.  The cough has since resolved since his acute illness and he reports some mucus production every couple of days.  He had a chest x-ray performed on 06/07/21 which showed findings of hyperinflation concerning for emphysema prompting referral to our clinic.  He denies any limitation to physical activity due to shortness of breath.  He walks 3 to 4 days/week, 30 minutes at a time without any issue.  He quit smoking in 2010.  He has a 56-year smoking history and smoked 1.5 packs/day.  He is retired and worked for [employer].  He denies any significant dust or chemical exposures.  Past Medical History:  Diagnosis Date   Adenomatous colon polyp    Allergic rhinitis    Anemia    Anxiety    Bladder cancer (HCC)    currently on bcg  treatments   BPH (benign prostatic hyperplasia)    CAD (coronary artery disease)    sees Dr. Nanetta Batty    Carotid artery disease [facility])    CHF (congestive heart failure) (HCC)    Chronic kidney disease    Sones. Prostate cancer   Clotting disorder (HCC)    CVA (cerebral infarction)    Diabetes mellitus    "Borderline"   Diverticulitis    Dysrhythmia    atrial fib - hx of    Edema    recent swelling in feet and ankles    GERD (gastroesophageal reflux disease)    history   Headache    Hemorrhoids    History of kidney stones    HTN (hypertension)    Hx of radiation therapy    for prostate - 2013    Hyperlipidemia LDL goal <70    IBS (irritable bowel syndrome)    Low back pain    Pre-diabetes    Prostate cancer (HCC) 2008   seed implantation   Sleep apnea    sitting in recliner helps him to breathe per pt , never used cpap sleep study - pt reports could not go to sleep- 6 years ago    Smoker    Stroke [facility])    hx of mini strokes years ago      Family History  Problem Relation Age of Onset   Heart disease Father    Hypertension Mother  Asthma Paternal Grandmother    Colon cancer Neg Hx    Esophageal cancer Neg Hx    Stomach cancer Neg Hx    Rectal cancer Neg Hx    Liver cancer Neg Hx      Social History   Socioeconomic History   Marital status: Married    Spouse name: Not on file   Number of children: 2   Years of education: Not on file   Highest education level: Not on file  Occupational History   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) [employer]   Occupation: Retired  Tobacco Use   Smoking status: Former    Current packs/day: 0.00    Average packs/day: 1.5 packs/day for 60.0 years (90.0 ttl pk-yrs)    Types: Cigarettes    Start date: 07/25/1948    Quit date: 07/25/2008    Years since quitting: 14.4   Smokeless tobacco: Never  Vaping Use   Vaping status: Never Used  Substance and Sexual Activity   Alcohol use: Not Currently   Drug use: No   Sexual  activity: Not Currently  Other Topics Concern   Not on file  Social History Narrative   Married second time   Retired [employer] n39 yrs   2 daughters and 2 stepdaughters   Social Determinants of Health   Financial Resource Strain: Low Risk  (10/19/2022)   Overall Financial Resource Strain (CARDIA)    Difficulty of Paying Living Expenses: Not hard at all  Food Insecurity: No Food Insecurity (10/19/2022)   Hunger Vital Sign    Worried About Running Out of Food in the Last Year: Never true    Ran Out of Food in the Last Year: Never true  Transportation Needs: No Transportation Needs (10/19/2022)   PRAPARE - Administrator, Civil Service (Medical): No    Lack of Transportation (Non-Medical): No  Physical Activity: Insufficiently Active (10/19/2022)   Exercise Vital Sign    Days of Exercise per Week: 4 days    Minutes of Exercise per Session: 30 min  Stress: No Stress Concern Present (10/19/2022)   [employer] of Occupational Health - Occupational Stress Questionnaire    Feeling of Stress : Only a little  Social Connections: Moderately Isolated (10/19/2022)   Social Connection and Isolation Panel [NHANES]    Frequency of Communication with Friends and Family: More than three times a week    Frequency of Social Gatherings with Friends and Family: More than three times a week    Attends Religious Services: Never    Database administrator or Organizations: No    Attends Banker Meetings: Never    Marital Status: Married  Catering manager Violence: Not At Risk (10/19/2022)   Humiliation, Afraid, Rape, and Kick questionnaire    Fear of Current or Ex-Partner: No    Emotionally Abused: No    Physically Abused: No    Sexually Abused: No     Allergies  Allergen Reactions   Lipitor [Atorvastatin] Other (See Comments)    Muscle pain   Other Rash and Other (See Comments)    Detergent- Rashes on the skin     Outpatient Medications Prior to Visit   Medication Sig Dispense Refill   acetaminophen (TYLENOL) 500 MG tablet Take 500-1,000 mg by mouth daily as needed for mild pain or headache.     apixaban (ELIQUIS) 5 MG TABS tablet TAKE 1 TABLET BY MOUTH TWICE A DAY 180 tablet 1   ascorbic acid (VITAMIN C)  500 MG tablet Take 500 mg by mouth 2 (two) times a week.     carvedilol (COREG) 3.125 MG tablet Take 1 tablet (3.125 mg total) by mouth 2 (two) times daily with a meal. 60 tablet 6   Cholecalciferol (VITAMIN D-3) 25 MCG (1000 UT) CAPS Take 1,000 Units by mouth daily.     dicyclomine (BENTYL) 10 MG capsule TAKE 1 CAPSULE (10 MG TOTAL) BY MOUTH EVERY 6 (SIX) HOURS AS NEEDED (ABDOMINAL PAIN). 30 capsule 1   fexofenadine (ALLEGRA) 180 MG tablet Take 180 mg by mouth daily as needed for allergies or rhinitis.      gabapentin (NEURONTIN) 300 MG capsule Take 1 capsule (300 mg total) by mouth 3 (three) times daily. (Patient taking differently: Take 300 mg by mouth 2 (two) times daily.) 270 capsule 1   hydrALAZINE (APRESOLINE) 25 MG tablet Take 25 mg by mouth 3 (three) times daily as needed (As need when BP is 160).     hydrocortisone (ANUSOL-HC) 2.5 % rectal cream Place 1 application rectally 3 (three) times daily as needed for hemorrhoids. 30 g 1   Hypromellose (ARTIFICIAL TEARS OP) Place 1 drop into both eyes 4 (four) times daily as needed (dry eyes).     melatonin 5 MG TABS Take 5 mg by mouth at bedtime as needed (sleep).     nitroGLYCERIN (NITROSTAT) 0.4 MG SL tablet Place 1 tablet (0.4 mg total) under the tongue every 5 (five) minutes as needed for chest pain. 25 tablet 3   rosuvastatin (CRESTOR) 10 MG tablet TAKE 1 TABLET BY MOUTH EVERY DAY 90 tablet 1   sacubitril-valsartan (ENTRESTO) 97-103 MG Take 1 tablet by mouth 2 (two) times daily. 180 tablet 3   spironolactone (ALDACTONE) 25 MG tablet TAKE 2 TABLETS BY MOUTH EVERY DAY 180 tablet 3   triamcinolone cream (KENALOG) 0.1 % Apply 1 application topically daily as needed (to affected areas, for  itching).      fluticasone (FLONASE) 50 MCG/ACT nasal spray Place 2 sprays into both nostrils daily. 16 g 6   ipratropium (ATROVENT) 0.06 % nasal spray SPRAY 2 SPRAYS INTO EACH NOSTRIL 4 TIMES A DAY AS NEEDED FOR RHINITIS 45 mL 1   Tiotropium Bromide Monohydrate (SPIRIVA RESPIMAT) 2.5 MCG/ACT AERS INHALE 2 PUFFS BY MOUTH INTO THE LUNGS DAILY 4 g 0   azithromycin (ZITHROMAX) 250 MG tablet Sig as indicated 6 tablet 0   levocetirizine (XYZAL) 5 MG tablet Take 1 tablet (5 mg total) by mouth as needed.     Wheat Dextrin (BENEFIBER DRINK MIX PO) Take 1 Dose by mouth every morning. 1 dose = 2 tablespoons     No facility-administered medications prior to visit.   Review of Systems  Constitutional:  Negative for chills, fever, malaise/fatigue and weight loss.  HENT:  Negative for congestion, sinus pain and sore throat.   Eyes: Negative.   Respiratory:  Positive for shortness of breath. Negative for cough, hemoptysis, sputum production and wheezing.   Cardiovascular:  Positive for orthopnea. Negative for chest pain, palpitations, claudication and leg swelling.  Gastrointestinal:  Negative for abdominal pain, heartburn, nausea and vomiting.  Genitourinary: Negative.   Musculoskeletal:  Negative for joint pain and myalgias.  Skin:  Negative for rash.  Neurological:  Negative for weakness.  Endo/Heme/Allergies: Negative.   Psychiatric/Behavioral: Negative.      Objective:   Vitals:   12/20/22 1513  BP: 130/70  Pulse: 74  Temp: 97.9 F (36.6 C)  TempSrc: Oral  SpO2: 98%  Weight:  180 lb 9.6 oz (81.9 kg)  Height: 5\' 8"  (1.727 m)    Physical Exam Constitutional:      General: He is not in acute distress. HENT:     Head: Normocephalic and atraumatic.  Eyes:     Conjunctiva/sclera: Conjunctivae normal.  Cardiovascular:     Rate and Rhythm: Normal rate and regular rhythm.     Pulses: Normal pulses.     Heart sounds: Normal heart sounds. No murmur heard. Pulmonary:     Effort: Pulmonary  effort is normal.     Breath sounds: No wheezing, rhonchi or rales.  Musculoskeletal:     Right lower leg: No edema.     Left lower leg: No edema.  Skin:    General: Skin is warm and dry.  Neurological:     General: No focal deficit present.     Mental Status: He is alert.    CBC    Component Value Date/Time   WBC 5.3 09/03/2022 1028   WBC 5.0 07/26/2022 1049   RBC 5.27 09/03/2022 1028   RBC 5.06 07/26/2022 1049   HGB 14.2 09/03/2022 1028   HCT 45.2 09/03/2022 1028   PLT 139 (L) 09/03/2022 1028   MCV 86 09/03/2022 1028   MCH 26.9 09/03/2022 1028   MCH 28.3 07/26/2022 1049   MCHC 31.4 (L) 09/03/2022 1028   MCHC 32.4 07/26/2022 1049   RDW 13.1 09/03/2022 1028   LYMPHSABS 1.0 06/27/2022 0840   LYMPHSABS 1.0 12/18/2017 1049   MONOABS 0.5 06/27/2022 0840   EOSABS 0.1 06/27/2022 0840   EOSABS 0.1 12/18/2017 1049   BASOSABS 0.0 06/27/2022 0840   BASOSABS 0.0 12/18/2017 1049      Latest Ref Rng & Units 09/03/2022   10:28 AM 07/26/2022   10:49 AM 06/27/2022    8:40 AM  BMP  Glucose 70 - 99 mg/dL 161  096  045   BUN 8 - 27 mg/dL 23  12  20    Creatinine 0.76 - 1.27 mg/dL 4.09  8.11  9.14   BUN/Creat Ratio 10 - 24 16     Sodium 134 - 144 mmol/L 139  140  140   Potassium 3.5 - 5.2 mmol/L 4.3  3.7  4.6   Chloride 96 - 106 mmol/L 102  103  102   CO2 20 - 29 mmol/L 24  25  29    Calcium 8.6 - 10.2 mg/dL 9.2  9.1  9.7    Chest imaging: CXR 06/07/21 Cardiomediastinal silhouette unchanged in size and contour. No evidence of central vascular congestion. No interlobular septal thickening.   Stigmata of emphysema, with increased retrosternal airspace, flattened hemidiaphragms, increased AP diameter, and hyperinflation on the AP view.  PFT:    Latest Ref Rng & Units 07/28/2021   11:49 AM  PFT Results  FVC-Pre L 2.71   FVC-Predicted Pre % 79   FVC-Post L 2.83   FVC-Predicted Post % 83   Pre FEV1/FVC % % 68   Post FEV1/FCV % % 69   FEV1-Pre L 1.85   FEV1-Predicted Pre % 79    FEV1-Post L 1.95   DLCO uncorrected ml/min/mmHg 17.81   DLCO UNC% % 80   DLCO corrected ml/min/mmHg 18.01   DLCO COR %Predicted % 81   DLVA Predicted % 107   TLC L 5.68   TLC % Predicted % 85   RV % Predicted % 109   PFTs 2023: mild obstructive defect  Labs:  Path:  Echo 05/16/20: LV EF 50-55%.  RV size and function are normal. LA moderately dilated. RA mildly Dilated.   Heart Catheterization 12/26/17: Previously placed Prox RCA to Mid RCA stent (unknown type) is widely patent. Prox LAD to Mid LAD lesion is 80% stenosed. A stent was successfully placed. Post intervention, there is a 0% residual stenosis. There is severe left ventricular systolic dysfunction. LV end diastolic pressure is mildly elevated. The left ventricular ejection fraction is less than 25% by visual estimate  Assessment & Plan:   Pulmonary emphysema, unspecified emphysema type (HCC) - Plan: Tiotropium Bromide-Olodaterol (STIOLTO RESPIMAT) 2.5-2.5 MCG/ACT AERS, DISCONTINUED: Tiotropium Bromide Monohydrate (SPIRIVA RESPIMAT) 2.5 MCG/ACT AERS  Chronic rhinitis - Plan: ipratropium (ATROVENT) 0.06 % nasal spray  Sinus congestion - Plan: fluticasone (FLONASE) 50 MCG/ACT nasal spray  Sinus headache - Plan: fluticasone (FLONASE) 50 MCG/ACT nasal spray  Discussion: Dillon Young is an 89 year old male, former smoker with history of GERD, CHF, CAD, hypertension and sleep apnea who returns to pulmonary clinic for emphysema.   He has mild obstructive defect on pulmonary function tests. He was on spiriva with some benefit. We will change him to stiolto 2 puffs daily and monitor for any response.   For chronic rhinitis he is to use fluticasone nasal spray and start ipratropium nasal spray, 2 sprays per nostril twice daily as needed. He is to take allegra for allergies.  Follow up in 6 months.   Melody Comas, MD [facility] Office: [phone]   Current Outpatient Medications:     acetaminophen (TYLENOL) 500 MG tablet, Take 500-1,000 mg by mouth daily as needed for mild pain or headache., Disp: , Rfl:    apixaban (ELIQUIS) 5 MG TABS tablet, TAKE 1 TABLET BY MOUTH TWICE A DAY, Disp: 180 tablet, Rfl: 1   ascorbic acid (VITAMIN C) 500 MG tablet, Take 500 mg by mouth 2 (two) times a week., Disp: , Rfl:    carvedilol (COREG) 3.125 MG tablet, Take 1 tablet (3.125 mg total) by mouth 2 (two) times daily with a meal., Disp: 60 tablet, Rfl: 6   Cholecalciferol (VITAMIN D-3) 25 MCG (1000 UT) CAPS, Take 1,000 Units by mouth daily., Disp: , Rfl:    dicyclomine (BENTYL) 10 MG capsule, TAKE 1 CAPSULE (10 MG TOTAL) BY MOUTH EVERY 6 (SIX) HOURS AS NEEDED (ABDOMINAL PAIN)., Disp: 30 capsule, Rfl: 1   fexofenadine (ALLEGRA) 180 MG tablet, Take 180 mg by mouth daily as needed for allergies or rhinitis. , Disp: , Rfl:    gabapentin (NEURONTIN) 300 MG capsule, Take 1 capsule (300 mg total) by mouth 3 (three) times daily. (Patient taking differently: Take 300 mg by mouth 2 (two) times daily.), Disp: 270 capsule, Rfl: 1   hydrALAZINE (APRESOLINE) 25 MG tablet, Take 25 mg by mouth 3 (three) times daily as needed (As need when BP is 160)., Disp: , Rfl:    hydrocortisone (ANUSOL-HC) 2.5 % rectal cream, Place 1 application rectally 3 (three) times daily as needed for hemorrhoids., Disp: 30 g, Rfl: 1   Hypromellose (ARTIFICIAL TEARS OP), Place 1 drop into both eyes 4 (four) times daily as needed (dry eyes)., Disp: , Rfl:    melatonin 5 MG TABS, Take 5 mg by mouth at bedtime as needed (sleep)., Disp: , Rfl:    nitroGLYCERIN (NITROSTAT) 0.4 MG SL tablet, Place 1 tablet (0.4 mg total) under the tongue every 5 (five) minutes as needed for chest pain., Disp: 25 tablet, Rfl: 3   rosuvastatin (CRESTOR) 10 MG tablet, TAKE 1 TABLET  BY MOUTH EVERY DAY, Disp: 90 tablet, Rfl: 1   sacubitril-valsartan (ENTRESTO) 97-103 MG, Take 1 tablet by mouth 2 (two) times daily., Disp: 180 tablet, Rfl: 3   spironolactone  (ALDACTONE) 25 MG tablet, TAKE 2 TABLETS BY MOUTH EVERY DAY, Disp: 180 tablet, Rfl: 3   Tiotropium Bromide-Olodaterol (STIOLTO RESPIMAT) 2.5-2.5 MCG/ACT AERS, Inhale 2 puffs into the lungs daily., Disp: 4 g, Rfl: 11   triamcinolone cream (KENALOG) 0.1 %, Apply 1 application topically daily as needed (to affected areas, for itching). , Disp: , Rfl:    fluticasone (FLONASE) 50 MCG/ACT nasal spray, Place 2 sprays into both nostrils daily., Disp: 16 g, Rfl: 6   ipratropium (ATROVENT) 0.06 % nasal spray, Place 2 sprays into both nostrils 2 (two) times daily., Disp: 45 mL, Rfl: 1

## 2023-01-11 NOTE — Progress Notes (Unsigned)
Cardiology Clinic Note   Patient Name: Dillon Young Date of Encounter: 01/14/2023  Primary Care Provider:  Etta Grandchild, MD Primary Cardiologist:  Dillon Batty, MD  Patient Profile    89 y.o. male with a h/o CAD, HTN, HL, CV dz and atrial fibrillation.  Afib Dx 2019  LVEF 30 to 35% (down from normal in 2017)   Placed on anticoagulation  Cardioverted in May 2019 Reverted to afib in 1 to 2 days.  Cath showed patent coronary arteries. Admitted in Oct 2019 for Tikosyn. Maintained SR for awhile. In  clinic on Feb 2024 found to be back in afib Underwent cardioversion, reverted to afib after 3 days Tikosyn stopped After wash out started on amiodarone  S/P DCCV 09/10/2022 by Dillon Young. Continued on amiodarone and Eliquis.   Past Medical History    Past Medical History:  Diagnosis Date   Adenomatous colon polyp    Allergic rhinitis    Anemia    Anxiety    Bladder cancer (HCC)    currently on bcg treatments   BPH (benign prostatic hyperplasia)    CAD (coronary artery disease)    sees Dr. Nanetta Young    Carotid artery disease [facility])    CHF (congestive heart failure) (HCC)    Chronic kidney disease    Sones. Prostate cancer   Clotting disorder (HCC)    CVA (cerebral infarction)    Diabetes mellitus    "Borderline"   Diverticulitis    Dysrhythmia    atrial fib - hx of    Edema    recent swelling in feet and ankles    GERD (gastroesophageal reflux disease)    history   Headache    Hemorrhoids    History of kidney stones    HTN (hypertension)    Hx of radiation therapy    for prostate - 2013    Hyperlipidemia LDL goal <70    IBS (irritable bowel syndrome)    Low back pain    Pre-diabetes    Prostate cancer (HCC) 2008   seed implantation   Sleep apnea    sitting in recliner helps him to breathe per pt , never used cpap sleep study - pt reports could not go to sleep- 6 years ago    Smoker    Stroke [facility])    hx of mini strokes years ago    Past Surgical History:   Procedure Laterality Date   APPENDECTOMY     CARDIAC CATHETERIZATION N/A 01/16/2016   Procedure: Left Heart Cath and Coronary Angiography;  Surgeon: Dillon Crafts, MD;  Location: [facility] INVASIVE CV LAB;  Service: Cardiovascular;  Laterality: N/A;   CARDIAC CATHETERIZATION N/A 01/16/2016   Procedure: Coronary Stent Intervention;  Surgeon: Dillon Crafts, MD;  Location: [facility] INVASIVE CV LAB;  Service: Cardiovascular;  Laterality: N/A;   CARDIOVERSION N/A 11/26/2017   Procedure: CARDIOVERSION;  Surgeon: Dillon Nose, MD;  Location: [facility] ENDOSCOPY;  Service: Cardiovascular;  Laterality: N/A;   CARDIOVERSION N/A 03/13/2018   Procedure: CARDIOVERSION;  Surgeon: Dillon Nose, MD;  Location: [facility] ENDOSCOPY;  Service: Cardiovascular;  Laterality: N/A;   CARDIOVERSION N/A 07/31/2022   Procedure: CARDIOVERSION;  Surgeon: Dillon Bunting, MD;  Location: [facility] ENDOSCOPY;  Service: Cardiovascular;  Laterality: N/A;   CARDIOVERSION N/A 09/10/2022   Procedure: CARDIOVERSION;  Surgeon: Dillon Riffle, MD;  Location: [facility] INVASIVE CV LAB;  Service: Cardiovascular;  Laterality: N/A;   CATARACT EXTRACTION W/ INTRAOCULAR LENS  IMPLANT, BILATERAL Bilateral  COLONOSCOPY  10/21/2008   diverticulosis, radiation proctitis, internal hemorrhoids   CORONARY ANGIOPLASTY     CORONARY STENT INTERVENTION N/A 12/26/2017   Procedure: CORONARY STENT INTERVENTION;  Surgeon: Dillon Gess, MD;  Location: [facility] INVASIVE CV LAB;  Service: Cardiovascular;  Laterality: N/A;   ENDARTERECTOMY  11/12/2011   Procedure: ENDARTERECTOMY CAROTID;  Surgeon: Dillon Earthly, MD;  Location: [facility] OR;  Service: Vascular;  Laterality: Right;   ESOPHAGOGASTRODUODENOSCOPY (EGD) WITH ESOPHAGEAL DILATION     HEMORRHOID BANDING     LEFT HEART CATH AND CORONARY ANGIOGRAPHY N/A 12/26/2017   Procedure: LEFT HEART CATH AND CORONARY ANGIOGRAPHY;  Surgeon: Dillon Gess, MD;  Location: [facility] INVASIVE CV LAB;  Service: Cardiovascular;  Laterality: N/A;    LITHOTRIPSY  X 3   LUMBAR LAMINECTOMY     TRANSURETHRAL RESECTION OF BLADDER TUMOR WITH MITOMYCIN-C Bilateral 01/06/2021   Procedure: TRANSURETHRAL RESECTION OF BLADDER TUMOR WITH GEMCITABINE BILATERAL RETROGRADES;  Surgeon: Dillon Pippin, MD;  Location: [facility] ORS;  Service: Urology;  Laterality: Bilateral;   TRANSURETHRAL RESECTION OF PROSTATE  10/2003   TRANSURETHRAL RESECTION OF PROSTATE N/A 05/31/2020   Procedure: CYSTOSCOPY TRANSURETHRAL RESECTION BIOPSY AND FULGURATION OF PROSTATE LESION  (TURP);  Surgeon: Dillon Pippin, MD;  Location: [facility] ORS;  Service: Urology;  Laterality: N/A;    Allergies  Allergies  Allergen Reactions   Lipitor [Atorvastatin] Other (See Comments)    Muscle pain   Other Rash and Other (See Comments)    Detergent- Rashes on the skin    History of Present Illness    [name] returns to the office today for ongoing assessment and management of PAF, status post cardioversion on 09/10/2042, on Eliquis and carvedilol.  Other history includes CAD, hypertension, and hyperlipidemia.  When seen last by the A-fib clinic on 09/27/2022, discussion was had concerning continue amiodarone, and reloading amiodarone to  set him up for additional DCCV.  On that discussion the patient refused any further cardioversions and reloading of amiodarone.  It was decided that amiodarone would be discontinued and he will just stay on rate control with carvedilol.  Since that time the patient states that although he can feel his atrial fibrillation he does not feel heart racing, or frequent palpitations.  He denies any dyspnea on exertion.  He states she does feel tired a good bit and his energy level is not what it used to be when he was in normal rhythm.  He is also stating that his blood pressures been running on the low side.  He has had some blood pressures 92/59 and 83/60 according to his blood pressure monitor and his recordings which he brings with him today.  He realizes that he does not drink  enough water, and had been drinking more water which did help his blood pressure to come back to as high as 111/75.  He continues to feel tired.  He and his wife normally walks 3 days a week at their church in the gym, however during the summer they have not done it as often as there is been summer programs at his church not allowing him to walk in the gym as before.  They are starting back doing this again now that school has restarted.  He does report some blood when he has a bowel movement after constipation.  He does report this to his primary care physician.  He is told to hold his Eliquis for 2 days and restart.  The last time he has had to hold  his Eliquis was approximately 6 months ago.  He is also being followed by urologist for history of bladder cancer but denies any hematuria.  Home Medications    Current Outpatient Medications  Medication Sig Dispense Refill   acetaminophen (TYLENOL) 500 MG tablet Take 500-1,000 mg by mouth daily as needed for mild pain or headache.     apixaban (ELIQUIS) 5 MG TABS tablet TAKE 1 TABLET BY MOUTH TWICE A DAY 180 tablet 1   ascorbic acid (VITAMIN C) 500 MG tablet Take 500 mg by mouth 2 (two) times a week.     carvedilol (COREG) 3.125 MG tablet Take 1 tablet (3.125 mg total) by mouth 2 (two) times daily with a meal. 60 tablet 6   Cholecalciferol (VITAMIN D-3) 25 MCG (1000 UT) CAPS Take 1,000 Units by mouth daily.     dicyclomine (BENTYL) 10 MG capsule TAKE 1 CAPSULE (10 MG TOTAL) BY MOUTH EVERY 6 (SIX) HOURS AS NEEDED (ABDOMINAL PAIN). 30 capsule 1   fexofenadine (ALLEGRA) 180 MG tablet Take 180 mg by mouth daily as needed for allergies or rhinitis.      fluticasone (FLONASE) 50 MCG/ACT nasal spray Place 2 sprays into both nostrils daily. 16 g 6   gabapentin (NEURONTIN) 300 MG capsule Take 1 capsule (300 mg total) by mouth 3 (three) times daily. (Patient taking differently: Take 300 mg by mouth 2 (two) times daily.) 270 capsule 1   hydrocortisone  (ANUSOL-HC) 2.5 % rectal cream Place 1 application rectally 3 (three) times daily as needed for hemorrhoids. 30 g 1   Hypromellose (ARTIFICIAL TEARS OP) Place 1 drop into both eyes 4 (four) times daily as needed (dry eyes).     ipratropium (ATROVENT) 0.06 % nasal spray Place 2 sprays into both nostrils 2 (two) times daily. 45 mL 1   melatonin 5 MG TABS Take 5 mg by mouth at bedtime as needed (sleep).     rosuvastatin (CRESTOR) 10 MG tablet TAKE 1 TABLET BY MOUTH EVERY DAY 90 tablet 1   sacubitril-valsartan (ENTRESTO) 97-103 MG Take 1 tablet by mouth 2 (two) times daily. 180 tablet 3   spironolactone (ALDACTONE) 25 MG tablet Take 1 tablet (25 mg total) by mouth daily. 90 tablet 3   Tiotropium Bromide-Olodaterol (STIOLTO RESPIMAT) 2.5-2.5 MCG/ACT AERS Inhale 2 puffs into the lungs daily. 4 g 11   Tiotropium Bromide-Olodaterol (STIOLTO RESPIMAT) 2.5-2.5 MCG/ACT AERS Inhale 2 puffs into the lungs daily.     triamcinolone cream (KENALOG) 0.1 % Apply 1 application topically daily as needed (to affected areas, for itching).      hydrALAZINE (APRESOLINE) 25 MG tablet Take 25 mg by mouth 3 (three) times daily as needed (As need when BP is 160). (Patient not taking: Reported on 01/14/2023)     nitroGLYCERIN (NITROSTAT) 0.4 MG SL tablet Place 1 tablet (0.4 mg total) under the tongue every 5 (five) minutes as needed for chest pain. (Patient not taking: Reported on 01/14/2023) 25 tablet 3   No current facility-administered medications for this visit.     Family History    Family History  Problem Relation Age of Onset   Heart disease Father    Hypertension Mother    Asthma Paternal Grandmother    Colon cancer Neg Hx    Esophageal cancer Neg Hx    Stomach cancer Neg Hx    Rectal cancer Neg Hx    Liver cancer Neg Hx    He indicated that his mother is deceased. He indicated that his father  is deceased. He indicated that his paternal grandmother is deceased. He indicated that the status of his neg hx is  unknown.  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) [employer]   Occupation: Retired  Tobacco Use   Smoking status: Former    Current packs/day: 0.00    Average packs/day: 1.5 packs/day for 60.0 years (90.0 ttl pk-yrs)    Types: Cigarettes    Start date: 07/25/1948    Quit date: 07/25/2008    Years since quitting: 14.4   Smokeless tobacco: Never  Vaping Use   Vaping status: Never Used  Substance and Sexual Activity   Alcohol use: Not Currently   Drug use: No   Sexual activity: Not Currently  Other Topics Concern   Not on file  Social History Narrative   Married second time   Retired [employer] n39 yrs   2 daughters and 2 stepdaughters   Social Determinants of Health   Financial Resource Strain: Low Risk  (10/19/2022)   Overall Financial Resource Strain (CARDIA)    Difficulty of Paying Living Expenses: Not hard at all  Food Insecurity: No Food Insecurity (10/19/2022)   Hunger Vital Sign    Worried About Running Out of Food in the Last Year: Never true    Ran Out of Food in the Last Year: Never true  Transportation Needs: No Transportation Needs (10/19/2022)   PRAPARE - Administrator, Civil Service (Medical): No    Lack of Transportation (Non-Medical): No  Physical Activity: Insufficiently Active (10/19/2022)   Exercise Vital Sign    Days of Exercise per Week: 4 days    Minutes of Exercise per Session: 30 min  Stress: No Stress Concern Present (10/19/2022)   [employer] of Occupational Health - Occupational Stress Questionnaire    Feeling of Stress : Only a little  Social Connections: Moderately Isolated (10/19/2022)   Social Connection and Isolation Panel [NHANES]    Frequency of Communication with Friends and Family: More  than three times a week    Frequency of Social Gatherings with Friends and Family: More than three times a week    Attends Religious Services: Never    Database administrator or Organizations: No    Attends Banker Meetings: Never    Marital Status: Married  Catering manager Violence: Not At Risk (10/19/2022)   Humiliation, Afraid, Rape, and Kick questionnaire    Fear of Current or Ex-Partner: No    Emotionally Abused: No    Physically Abused: No    Sexually Abused: No     Review of Systems    General:  No chills, fever, night sweats or weight changes.  Generalized fatigue Cardiovascular:  No chest pain, dyspnea on exertion, edema, orthopnea, palpitations, paroxysmal nocturnal dyspnea. Dermatological: No rash, lesions/masses Respiratory: No cough, dyspnea Urologic: No hematuria, dysuria Abdominal:   No nausea, vomiting, diarrhea, positive for bright red blood per rectum during times when he is constipated, no melena, or hematemesis Neurologic:  No visual changes, wkns, changes in mental status. All other systems reviewed and are otherwise negative except as noted above.       Physical Exam    VS:  BP 120/62 (BP Location: Left  Arm, Patient Position: Sitting, Cuff Size: Normal)   Pulse (!) 32   Ht 5\' 9"  (1.753 m)   Wt 180 lb (81.6 kg)   SpO2 98%   BMI 26.58 kg/m  , BMI Body mass index is 26.58 kg/m.     GEN: Well nourished, well developed, in no acute distress. HEENT: normal. Neck: Supple, no JVD, carotid bruits, or masses. Cardiac: IRRR, no murmurs, rubs, or gallops. No clubbing, cyanosis, edema.  Radials/DP/PT 2+ and equal bilaterally.  Respiratory:  Respirations regular and unlabored, clear to auscultation bilaterally. GI: Soft, nontender, nondistended, BS + x 4. MS: no deformity or atrophy. Skin: warm and dry, no rash. Neuro:  Strength and sensation are intact.  Very hard of hearing. Psych: Normal affect.      Lab Results  Component Value Date   WBC  5.3 09/03/2022   HGB 14.2 09/03/2022   HCT 45.2 09/03/2022   MCV 86 09/03/2022   PLT 139 (L) 09/03/2022   Lab Results  Component Value Date   CREATININE 1.40 (H) 09/03/2022   BUN 23 09/03/2022   NA 139 09/03/2022   K 4.3 09/03/2022   CL 102 09/03/2022   CO2 24 09/03/2022   Lab Results  Component Value Date   ALT 9 06/27/2022   AST 14 06/27/2022   ALKPHOS 49 06/27/2022   BILITOT 0.7 06/27/2022   Lab Results  Component Value Date   CHOL 129 10/18/2021   HDL 45 10/18/2021   LDLCALC 60 10/18/2021   TRIG 118 10/18/2021   CHOLHDL 2.9 10/18/2021    Lab Results  Component Value Date   HGBA1C 6.5 06/27/2022     Review of Prior Studies    Echocardiogram 08/22/2022 1. Left ventricular ejection fraction, by estimation, is 40 to 45%. The  left ventricle has mildly decreased function. The left ventricle  demonstrates global hypokinesis. There is mild concentric left ventricular  hypertrophy. Left ventricular diastolic  function could not be evaluated.   2. Right ventricular systolic function is normal. The right ventricular  size is normal. There is normal pulmonary artery systolic pressure. The  estimated right ventricular systolic pressure is 31.8 mmHg.   3. Left atrial size was mild to moderately dilated.   4. The mitral valve is normal in structure. Trivial mitral valve  regurgitation. No evidence of mitral stenosis.   5. The aortic valve is grossly normal. There is mild calcification of the  aortic valve. There is mild thickening of the aortic valve. Aortic valve  regurgitation is not visualized. Aortic valve sclerosis is present, with  no evidence of aortic valve  stenosis.   6. The inferior vena cava is normal in size with <50% respiratory  variability, suggesting right atrial pressure of 8 mmHg.    Carotid Ultrasound 09/26/2022 Summary:  Right Carotid: Patent right carotid endarterectomy site without evidence  of significant diameter reduction or stenosis.   Left  Carotid: Velocities in the left ICA are consistent with a 1-39%  stenosis, high-end of range.   Vertebrals:  Bilateral vertebral arteries demonstrate antegrade flow.  Subclavians: Normal flow hemodynamics were seen in bilateral subclavian               arteries.    Assessment & Plan   1.  Permanent atrial fibrillation: He continues to be followed by the A-fib clinic and remains on Eliquis.  He is elected not to have any more cardioversions or antiarrhythmic medications per note from A-fib clinic on 09/29/2022.  He is now  on carvedilol for rate control.  Most recent EKG per atrial fibrillation clinic had a ventricular rate of 63 bpm.  He does admit to having blood with bowel movement when he is constipated.  This is not occurred for over 6 months.  2.  Chronic systolic heart failure: No evidence of volume overload.  Most recent echocardiogram revealed an EF of 40 to 45%.  Due to hypotension and feelings of fatigue, I will decrease his spironolactone from 50 mg each morning to 25 mg each morning.  He is to keep up with his blood pressure at home.  He is to report blood pressure greater than 140/80.  Otherwise he will continue his current medication regimen which includes carvedilol, Entresto at 97/103 mg, and hydralazine 25 mg up to 3 times a day as needed for blood pressure greater than 160 mmHg systolic.  3.  Hypertension: In the office today the patient's blood pressure is normal.  Continue to monitor this is at home with above-mentioned changes.  4.  Hypercholesterolemia: Continues on rosuvastatin 10 mg daily.  Labs provided by primary care.  Be happy to draw them again when he is fasting on next office visit if has not been completed.  5.  History of bladder cancer: Being followed by urology.  He is not having any hematuria on Eliquis.       Signed, Bettey Mare. Liborio Nixon, ANP, AACC   01/14/2023 9:36 AM      Office [phone] Fax [phone]  Notice: This dictation was prepared  with Dragon dictation along with smaller phrase technology. Any transcriptional errors that result from this process are unintentional and may not be corrected upon review.  Dr. Gery Pray (this for other things sorry I work with Dr. Molli Knock your cardiac just and he follows you for atrial fibrillation and you for procedures trying to correct that atrial fibrillation or so they have got on amiodarone now that the medicine to see if they can keep your heart rate normalized okay waited they stopped that my last note showed you were on amiodarone so you murmur with a few office okay with me left

## 2023-01-14 ENCOUNTER — Ambulatory Visit: Payer: PPO | Attending: Adult Health | Admitting: Adult Health

## 2023-01-14 ENCOUNTER — Encounter: Payer: Self-pay | Admitting: Adult Health

## 2023-01-14 VITALS — BP 120/62 | HR 32 | Ht 69.0 in | Wt 180.0 lb

## 2023-01-14 DIAGNOSIS — I5042 Chronic combined systolic (congestive) and diastolic (congestive) heart failure: Secondary | ICD-10-CM | POA: Diagnosis not present

## 2023-01-14 DIAGNOSIS — I4821 Permanent atrial fibrillation: Secondary | ICD-10-CM

## 2023-01-14 DIAGNOSIS — E7800 Pure hypercholesterolemia, unspecified: Secondary | ICD-10-CM | POA: Diagnosis not present

## 2023-01-14 DIAGNOSIS — C679 Malignant neoplasm of bladder, unspecified: Secondary | ICD-10-CM

## 2023-01-14 DIAGNOSIS — I10 Essential (primary) hypertension: Secondary | ICD-10-CM

## 2023-01-14 MED ORDER — SPIRONOLACTONE 25 MG PO TABS: 25.0000 mg | ORAL_TABLET | Freq: Every day | ORAL | 3 refills | Status: DC

## 2023-01-14 NOTE — Patient Instructions (Signed)
Medication Instructions:  Decrease Spironolactone from 50 mg to 25 mg ( Take 1 Tablet Daily). *If you need a refill on your cardiac medications before your next appointment, please call your pharmacy*   Lab Work: No Labs If you have labs (blood work) drawn today and your tests are completely normal, you will receive your results only by: MyChart Message (if you have MyChart) OR A paper copy in the mail If you have any lab test that is abnormal or we need to change your treatment, we will call you to review the results.   Testing/Procedures: No Testing   Follow-Up: At [facility], you and your health needs are our priority.  As part of our continuing mission to provide you with exceptional heart care, we have created designated Provider Care Teams.  These Care Teams include your primary Cardiologist (physician) and Advanced Practice Providers (APPs -  Physician Assistants and Nurse Practitioners) who all work together to provide you with the care you need, when you need it.  We recommend signing up for the patient portal called "MyChart".  Sign up information is provided on this After Visit Summary.  MyChart is used to connect with patients for Virtual Visits (Telemedicine).  Patients are able to view lab/test results, encounter notes, upcoming appointments, etc.  Non-urgent messages can be sent to your provider as well.   To learn more about what you can do with MyChart, go to [email or web address].    Your next appointment:   6 month(s)  Provider:   Nanetta Batty, MD   Other Instructions Monitor Blood Pressure Daily. Blood Pressure above 140/80 Call Office.

## 2023-01-15 DIAGNOSIS — H903 Sensorineural hearing loss, bilateral: Secondary | ICD-10-CM | POA: Diagnosis not present

## 2023-01-16 DIAGNOSIS — N210 Calculus in bladder: Secondary | ICD-10-CM | POA: Diagnosis not present

## 2023-01-16 DIAGNOSIS — R310 Gross hematuria: Secondary | ICD-10-CM | POA: Diagnosis not present

## 2023-01-16 DIAGNOSIS — N3041 Irradiation cystitis with hematuria: Secondary | ICD-10-CM | POA: Diagnosis not present

## 2023-01-16 DIAGNOSIS — Z8551 Personal history of malignant neoplasm of bladder: Secondary | ICD-10-CM | POA: Diagnosis not present

## 2023-01-16 DIAGNOSIS — N3946 Mixed incontinence: Secondary | ICD-10-CM | POA: Diagnosis not present

## 2023-01-21 ENCOUNTER — Encounter: Payer: Self-pay | Admitting: Cardiovascular Disease

## 2023-01-22 NOTE — Telephone Encounter (Signed)
I returned Dillon Young's call and he was feeling much better.  He denied any chest pain shortness of breath or dizziness.  He he has chronic fatigue.  I explained to him when he is in atrial fibrillation readings may not be completely accurate due to the irregular heart rhythm.  If he begins to feel dizzy, really fatigued, or having worsening symptoms he is to have his blood pressure checked by a healthcare provider, either his primary care provider or urgent care.  He states he was feeling better I did not feel it was necessary to do so right now.  He will keep [place] informed.

## 2023-01-31 DIAGNOSIS — L299 Pruritus, unspecified: Secondary | ICD-10-CM | POA: Insufficient documentation

## 2023-02-04 DIAGNOSIS — C678 Malignant neoplasm of overlapping sites of bladder: Secondary | ICD-10-CM | POA: Diagnosis not present

## 2023-02-04 DIAGNOSIS — Z5111 Encounter for antineoplastic chemotherapy: Secondary | ICD-10-CM | POA: Diagnosis not present

## 2023-02-08 DIAGNOSIS — R519 Headache, unspecified: Secondary | ICD-10-CM | POA: Diagnosis not present

## 2023-02-08 DIAGNOSIS — M542 Cervicalgia: Secondary | ICD-10-CM | POA: Diagnosis not present

## 2023-02-11 DIAGNOSIS — C678 Malignant neoplasm of overlapping sites of bladder: Secondary | ICD-10-CM | POA: Diagnosis not present

## 2023-02-11 DIAGNOSIS — Z5111 Encounter for antineoplastic chemotherapy: Secondary | ICD-10-CM | POA: Diagnosis not present

## 2023-02-18 DIAGNOSIS — Z5111 Encounter for antineoplastic chemotherapy: Secondary | ICD-10-CM | POA: Diagnosis not present

## 2023-02-18 DIAGNOSIS — C678 Malignant neoplasm of overlapping sites of bladder: Secondary | ICD-10-CM | POA: Diagnosis not present

## 2023-02-25 ENCOUNTER — Encounter (HOSPITAL_COMMUNITY): Payer: Self-pay

## 2023-02-25 ENCOUNTER — Emergency Department (HOSPITAL_COMMUNITY)
Admission: EM | Admit: 2023-02-25 | Discharge: 2023-02-26 | Discharge status: Against Medical Advice | Payer: PPO | Attending: Emergency Medicine | Admitting: Emergency Medicine

## 2023-02-25 ENCOUNTER — Emergency Department (HOSPITAL_COMMUNITY): Payer: PPO

## 2023-02-25 ENCOUNTER — Other Ambulatory Visit: Payer: Self-pay

## 2023-02-25 ENCOUNTER — Telehealth: Payer: Self-pay | Admitting: Adult Health

## 2023-02-25 DIAGNOSIS — I700 Atherosclerosis of aorta: Secondary | ICD-10-CM | POA: Diagnosis not present

## 2023-02-25 DIAGNOSIS — R079 Chest pain, unspecified: Secondary | ICD-10-CM | POA: Diagnosis not present

## 2023-02-25 DIAGNOSIS — R0602 Shortness of breath: Secondary | ICD-10-CM | POA: Diagnosis not present

## 2023-02-25 DIAGNOSIS — Z5321 Procedure and treatment not carried out due to patient leaving prior to being seen by health care provider: Secondary | ICD-10-CM | POA: Insufficient documentation

## 2023-02-25 LAB — BASIC METABOLIC PANEL
Anion gap: 9 (ref 5–15)
BUN: 25 mg/dL — ABNORMAL HIGH (ref 8–23)
CO2: 25 mmol/L (ref 22–32)
Calcium: 9 mg/dL (ref 8.9–10.3)
Chloride: 103 mmol/L (ref 98–111)
Creatinine, Ser: 1.25 mg/dL — ABNORMAL HIGH (ref 0.61–1.24)
GFR, Estimated: 55 mL/min — ABNORMAL LOW (ref >60)
Glucose, Bld: 115 mg/dL — ABNORMAL HIGH (ref 70–99)
Potassium: 5 mmol/L (ref 3.5–5.1)
Sodium: 137 mmol/L (ref 135–145)

## 2023-02-25 LAB — CBC
HCT: 43.9 % (ref 39.0–52.0)
Hemoglobin: 14.1 g/dL (ref 13.0–17.0)
MCH: 28.3 pg (ref 26.0–34.0)
MCHC: 32.1 g/dL (ref 30.0–36.0)
MCV: 88 fL (ref 80.0–100.0)
Platelets: 166 10*3/uL (ref 150–400)
RBC: 4.99 MIL/uL (ref 4.22–5.81)
RDW: 13.9 % (ref 11.5–15.5)
WBC: 6.8 10*3/uL (ref 4.0–10.5)
nRBC: 0 % (ref 0.0–0.2)

## 2023-02-25 LAB — TROPONIN I (HIGH SENSITIVITY): Troponin I (High Sensitivity): 6 ng/L (ref <18)

## 2023-02-25 NOTE — Telephone Encounter (Signed)
   Pt c/o of Chest Pain: STAT if active CP, including tightness, pressure, jaw pain, radiating pain to shoulder/upper arm/back, CP unrelieved by Nitro. Symptoms reported of SOB, nausea, vomiting, sweating.  1. Are you having CP right now? Yes     2. Are you experiencing any other symptoms (ex. SOB, nausea, vomiting, sweating)? No    3. Is your CP continuous or coming and going? Continuous    4. Have you taken Nitroglycerin? Took 1 and it helped   5. How long have you been experiencing CP? Since yesterday     6. If NO CP at time of call then end call with telling Pt to call back or call 911 if Chest pain returns prior to return call from triage team.

## 2023-02-25 NOTE — ED Triage Notes (Addendum)
Pt coming in with c/o chest pain that began yesterday. Pt states he was working in yard around 1400 today and noticed it was getting worse. States he took 2 nitroglycerin pills, as his doctor's office told him to do so when he called. He states the pain only subsided temporarily. He states the chest pain is in the middle and described it as pressure. Endorses SOB. Denies headache, blurred vision, n/v/d, abdominal pain. Pt states he has hx of a-fib. Pt alert and oriented x4.

## 2023-02-25 NOTE — Telephone Encounter (Signed)
Pressure/tightness on chest started yesterday. It was really bad this morning so took NTG.- Took 1 NTG 3 hours ago and it helped at the time, but it is back. He reports that he has been planting flowers lately- and the pain started while planting, but it continued to hurt after resting. It seemed to "ease up some, but has never completely gone away/ (constant pressure in chest like indigestion) 0800 this morning BP 113/59  He reports that he feels tightness/pressure in his chest. Instructed to take NTG. Took one NTG at 1521 BP= 118/102 hr 84 (He states that this BP is much higher than it has been- it is never this high; has run low at times lately)  Says that it eased up "a little"  took BP again= 131/106- still having chest pain and pressure 1532- asked him to take another NTG-  Patient took second NTG and this RN advised that he needs to be evaluated at the ER.  He said his wife can take him. He lives about 5 minutes from the ER- instructed him to go to [facility].   He denies sob, nausea, dizziness, No pain in arm/back/jaw. Informed him that he can take a 3rd dose of NTG if needed on the way to the ER. He verbalized understanding and will go to the ER now.

## 2023-02-26 ENCOUNTER — Telehealth: Payer: Self-pay | Admitting: Cardiovascular Disease

## 2023-02-26 ENCOUNTER — Telehealth: Payer: Self-pay | Admitting: Internal Medicine

## 2023-02-26 NOTE — Telephone Encounter (Signed)
Spoke with pt. He states he went to the ED after speaking with a nurse here yesterday. Pt is wondering how he can get the results. Pt made aware he might be able to see them on MyChart. He verbalized understanding. Pt wanted to know if he should see Dr. Allyson Sabal or his partner since he is still having the pain. Will get message to Dr. Allyson Sabal for review.

## 2023-02-26 NOTE — Telephone Encounter (Signed)
Pt called wanting to speak with a nurse about his hospital visit. Pt went  to the ER last night and was calling inquiring how can he request a copy of his results.

## 2023-02-26 NOTE — Telephone Encounter (Signed)
See previous encounter.  Patient is following up. He reports he went to [facility] as advised. He waited about 5 hours and never saw a doctor. He states he started to feel better and there were still people waiting ahead of him, so he left. She states he had lab work, an Personal assistant, and other testing while there. He would like to know if Dr. Allyson Sabal can review this information.

## 2023-02-26 NOTE — ED Notes (Signed)
Pt called to be roomed x3, no answer

## 2023-02-26 NOTE — Telephone Encounter (Signed)
Called pt. Relied Dr. Hazle Coca message. Made appt for pt. Pt thankful for call back.

## 2023-02-28 ENCOUNTER — Encounter: Payer: Self-pay | Admitting: Nurse Practitioner

## 2023-02-28 ENCOUNTER — Ambulatory Visit: Payer: PPO | Attending: Nurse Practitioner | Admitting: Nurse Practitioner

## 2023-02-28 VITALS — BP 128/68 | HR 61 | Ht 69.0 in | Wt 172.0 lb

## 2023-02-28 DIAGNOSIS — I4821 Permanent atrial fibrillation: Secondary | ICD-10-CM

## 2023-02-28 DIAGNOSIS — I10 Essential (primary) hypertension: Secondary | ICD-10-CM | POA: Diagnosis not present

## 2023-02-28 DIAGNOSIS — E785 Hyperlipidemia, unspecified: Secondary | ICD-10-CM | POA: Diagnosis not present

## 2023-02-28 DIAGNOSIS — I25118 Atherosclerotic heart disease of native coronary artery with other forms of angina pectoris: Secondary | ICD-10-CM

## 2023-02-28 DIAGNOSIS — I5022 Chronic systolic (congestive) heart failure: Secondary | ICD-10-CM | POA: Diagnosis not present

## 2023-02-28 DIAGNOSIS — I6523 Occlusion and stenosis of bilateral carotid arteries: Secondary | ICD-10-CM | POA: Diagnosis not present

## 2023-02-28 NOTE — Patient Instructions (Signed)
Medication Instructions:  Please take Carvedilol 3.125 mg twice daily Decrease Spironolactone 12.5 mg daily  *If you need a refill on your cardiac medications before your next appointment, please call your pharmacy*   Lab Work: NONE ordered at this time of appointment    Testing/Procedures: NONE ordered at this time of appointment     Follow-Up: At [facility], you and your health needs are our priority.  As part of our continuing mission to provide you with exceptional heart care, we have created designated Provider Care Teams.  These Care Teams include your primary Cardiologist (physician) and Advanced Practice Providers (APPs -  Physician Assistants and Nurse Practitioners) who all work together to provide you with the care you need, when you need it.  We recommend signing up for the patient portal called "MyChart".  Sign up information is provided on this After Visit Summary.  MyChart is used to connect with patients for Virtual Visits (Telemedicine).  Patients are able to view lab/test results, encounter notes, upcoming appointments, etc.  Non-urgent messages can be sent to your provider as well.   To learn more about what you can do with MyChart, go to [email or web address].    Your next appointment:   2-3 month(s)  Provider:   Bernadene Person, NP

## 2023-02-28 NOTE — Progress Notes (Addendum)
Weight:       178.0 lb Date of Birth:  1932-07-22        BSA:          1.966 m Patient Age:    89 years         BP:           134/65 mmHg Patient Gender: M                HR:           59 bpm. Exam Location:  [address]  Procedure: 2D Echo, Cardiac Doppler and Color Doppler  Indications:    I48.00 Atrial Fibrillation  History:        Patient has prior history of Echocardiogram examinations, most recent 11/03/2021. CAD, Carotid Disease and CKD, Arrythmias:Bradycardia; Risk Factors:Hypertension and Diabetes.  Sonographer:    Clearence Ped RCS Referring Phys: [phone] Dillon Young  IMPRESSIONS   1. Left ventricular ejection fraction, by estimation, is 40 to 45%. The left ventricle has mildly decreased function. The left ventricle demonstrates global hypokinesis. There is mild concentric left ventricular hypertrophy. Left ventricular diastolic function could not be evaluated. 2. Right ventricular systolic function is normal. The right ventricular size is normal. There is normal pulmonary artery systolic pressure. The estimated right ventricular systolic pressure is 31.8 mmHg. 3. Left atrial size was mild to moderately dilated. 4. The mitral valve is normal in structure. Trivial mitral valve regurgitation. No evidence of mitral stenosis. 5. The aortic valve is grossly normal. There is mild calcification of the aortic valve. There is mild thickening of the aortic valve. Aortic valve regurgitation is not visualized. Aortic valve sclerosis is present, with no evidence of aortic valve stenosis. 6. The inferior vena cava is normal in size with <50% respiratory variability, suggesting right atrial pressure of 8 mmHg.  Comparison(s): Prior images reviewed side by side. Changes from prior study are noted. EF mildly reduced compared to prior. Patient also in atrial fibrillation on current study.  FINDINGS Left Ventricle: Left  ventricular ejection fraction, by estimation, is 40 to 45%. The left ventricle has mildly decreased function. The left ventricle demonstrates global hypokinesis. The left ventricular internal cavity size was normal in size. There is mild concentric left ventricular hypertrophy. Left ventricular diastolic function could not be evaluated due to atrial fibrillation. Left ventricular diastolic function could not be evaluated.  Right Ventricle: The right ventricular size is normal. No increase in right ventricular wall thickness. Right ventricular systolic function is normal. There is normal pulmonary artery systolic pressure. The tricuspid regurgitant velocity is 2.44 m/s, and with an assumed right atrial pressure of 8 mmHg, the estimated right ventricular systolic pressure is 31.8 mmHg.  Left Atrium: Left atrial size was mild to moderately dilated.  Right Atrium: Right atrial size was normal in size.  Pericardium: There is no evidence of pericardial effusion.  Mitral Valve: The mitral valve is normal in structure. Trivial mitral valve regurgitation. No evidence of mitral valve stenosis.  Tricuspid Valve: The tricuspid valve is normal in structure. Tricuspid valve regurgitation is mild . No evidence of tricuspid stenosis.  Aortic Valve: The aortic valve is grossly normal. There is mild calcification of the aortic valve. There is mild thickening of the aortic valve. Aortic valve regurgitation is not visualized. Aortic valve sclerosis is present, with no evidence of aortic valve stenosis.  Pulmonic Valve: The pulmonic valve was not well visualized. Pulmonic valve regurgitation is trivial. No evidence of pulmonic stenosis.  Aorta:  Weight:       178.0 lb Date of Birth:  1932-07-22        BSA:          1.966 m Patient Age:    89 years         BP:           134/65 mmHg Patient Gender: M                HR:           59 bpm. Exam Location:  [address]  Procedure: 2D Echo, Cardiac Doppler and Color Doppler  Indications:    I48.00 Atrial Fibrillation  History:        Patient has prior history of Echocardiogram examinations, most recent 11/03/2021. CAD, Carotid Disease and CKD, Arrythmias:Bradycardia; Risk Factors:Hypertension and Diabetes.  Sonographer:    Clearence Ped RCS Referring Phys: [phone] Dillon Young  IMPRESSIONS   1. Left ventricular ejection fraction, by estimation, is 40 to 45%. The left ventricle has mildly decreased function. The left ventricle demonstrates global hypokinesis. There is mild concentric left ventricular hypertrophy. Left ventricular diastolic function could not be evaluated. 2. Right ventricular systolic function is normal. The right ventricular size is normal. There is normal pulmonary artery systolic pressure. The estimated right ventricular systolic pressure is 31.8 mmHg. 3. Left atrial size was mild to moderately dilated. 4. The mitral valve is normal in structure. Trivial mitral valve regurgitation. No evidence of mitral stenosis. 5. The aortic valve is grossly normal. There is mild calcification of the aortic valve. There is mild thickening of the aortic valve. Aortic valve regurgitation is not visualized. Aortic valve sclerosis is present, with no evidence of aortic valve stenosis. 6. The inferior vena cava is normal in size with <50% respiratory variability, suggesting right atrial pressure of 8 mmHg.  Comparison(s): Prior images reviewed side by side. Changes from prior study are noted. EF mildly reduced compared to prior. Patient also in atrial fibrillation on current study.  FINDINGS Left Ventricle: Left  ventricular ejection fraction, by estimation, is 40 to 45%. The left ventricle has mildly decreased function. The left ventricle demonstrates global hypokinesis. The left ventricular internal cavity size was normal in size. There is mild concentric left ventricular hypertrophy. Left ventricular diastolic function could not be evaluated due to atrial fibrillation. Left ventricular diastolic function could not be evaluated.  Right Ventricle: The right ventricular size is normal. No increase in right ventricular wall thickness. Right ventricular systolic function is normal. There is normal pulmonary artery systolic pressure. The tricuspid regurgitant velocity is 2.44 m/s, and with an assumed right atrial pressure of 8 mmHg, the estimated right ventricular systolic pressure is 31.8 mmHg.  Left Atrium: Left atrial size was mild to moderately dilated.  Right Atrium: Right atrial size was normal in size.  Pericardium: There is no evidence of pericardial effusion.  Mitral Valve: The mitral valve is normal in structure. Trivial mitral valve regurgitation. No evidence of mitral valve stenosis.  Tricuspid Valve: The tricuspid valve is normal in structure. Tricuspid valve regurgitation is mild . No evidence of tricuspid stenosis.  Aortic Valve: The aortic valve is grossly normal. There is mild calcification of the aortic valve. There is mild thickening of the aortic valve. Aortic valve regurgitation is not visualized. Aortic valve sclerosis is present, with no evidence of aortic valve stenosis.  Pulmonic Valve: The pulmonic valve was not well visualized. Pulmonic valve regurgitation is trivial. No evidence of pulmonic stenosis.  Aorta:  CAROTID;  Surgeon: Larina Earthly, MD;  Location: [facility] OR;  Service: Vascular;  Laterality: Right;   ESOPHAGOGASTRODUODENOSCOPY (EGD) WITH ESOPHAGEAL DILATION     HEMORRHOID BANDING     LEFT HEART CATH AND CORONARY ANGIOGRAPHY N/A 12/26/2017   Procedure: LEFT HEART CATH AND CORONARY ANGIOGRAPHY;  Surgeon: Runell Gess, MD;  Location: [facility];  Service: Cardiovascular;  Laterality: N/A;   LITHOTRIPSY  X 3   LUMBAR LAMINECTOMY     TRANSURETHRAL RESECTION OF BLADDER TUMOR WITH MITOMYCIN-C Bilateral 01/06/2021   Procedure: TRANSURETHRAL RESECTION OF BLADDER TUMOR WITH GEMCITABINE BILATERAL RETROGRADES;  Surgeon: Bjorn Pippin, MD;   Location: [facility];  Service: Urology;  Laterality: Bilateral;   TRANSURETHRAL RESECTION OF PROSTATE  10/2003   TRANSURETHRAL RESECTION OF PROSTATE N/A 05/31/2020   Procedure: CYSTOSCOPY TRANSURETHRAL RESECTION BIOPSY AND FULGURATION OF PROSTATE LESION  (TURP);  Surgeon: Bjorn Pippin, MD;  Location: [facility];  Service: Urology;  Laterality: N/A;    Allergies  Allergies  Allergen Reactions   Lipitor [Atorvastatin] Other (See Comments)    Muscle pain   Other Rash and Other (See Comments)    Detergent- Rashes on the skin     Labs/Other Studies Reviewed    The following studies were reviewed today:  Cardiac Studies & Procedures   CARDIAC CATHETERIZATION  CARDIAC CATHETERIZATION 12/26/2017  Narrative Images from the original result were not included.   Previously placed Prox RCA to Mid RCA stent (unknown type) is widely patent.  Prox LAD to Mid LAD lesion is 80% stenosed.  A stent was successfully placed.  Post intervention, there is a 0% residual stenosis.  There is severe left ventricular systolic dysfunction.  LV end diastolic pressure is mildly elevated.  The left ventricular ejection fraction is less than 25% by visual estimate.  Dillon Young is a 89 y.o. male   [id number] LOCATION:  FACILITY: [facility] PHYSICIAN: Nanetta Batty, M.D. 1932-09-27   DATE OF PROCEDURE:  12/26/2017  DATE OF DISCHARGE:     CARDIAC CATHETERIZATION / PCI DES LAD    History obtained from chart review.Dillon Young is a 89 y.o.  married Caucasian male father of 2 children, grandfather to 2 grandchildren he was formally a patient of Dr. Rocco Serene. I last saw him in the office    10/02/2017. 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 He works out 5 days a week and has joined [employer]. He has a history of coronary artery disease status post angioplasty in 1994 and 1995  Of his circumflex coronary artery. He has not  required cardiac catheterization since stress Myoview was performed in 2011 that was low risk and nonischemic. He has a history of hypertension and hyperlipidemia on medication. He had right carotid endarterectomy performed by Dr. Tawanna Cooler Early June 2013 followed by duplex ultrasound. He denies chest pain or shortness of breath. His major complaint is of cramping in his thighs which may be related to his statin drug. He was given a statin holiday which really did not impact his symptoms and therefore statin was restarted and follow-up lipid profile performed 07/05/14 revealed a total cholesterol 131, LDL 58 and HDL of 44. Recent carotid Dopplers revealed his endarterectomy site was widely patent with moderate left ICA stenosis and moderate left subclavian artery stenosis without symptoms. He developed symptoms in August of this year and saw Nada Boozer. A Myoview stress test performed 01/09/16 was notable  Weight:       178.0 lb Date of Birth:  1932-07-22        BSA:          1.966 m Patient Age:    89 years         BP:           134/65 mmHg Patient Gender: M                HR:           59 bpm. Exam Location:  [address]  Procedure: 2D Echo, Cardiac Doppler and Color Doppler  Indications:    I48.00 Atrial Fibrillation  History:        Patient has prior history of Echocardiogram examinations, most recent 11/03/2021. CAD, Carotid Disease and CKD, Arrythmias:Bradycardia; Risk Factors:Hypertension and Diabetes.  Sonographer:    Clearence Ped RCS Referring Phys: [phone] Dillon Young  IMPRESSIONS   1. Left ventricular ejection fraction, by estimation, is 40 to 45%. The left ventricle has mildly decreased function. The left ventricle demonstrates global hypokinesis. There is mild concentric left ventricular hypertrophy. Left ventricular diastolic function could not be evaluated. 2. Right ventricular systolic function is normal. The right ventricular size is normal. There is normal pulmonary artery systolic pressure. The estimated right ventricular systolic pressure is 31.8 mmHg. 3. Left atrial size was mild to moderately dilated. 4. The mitral valve is normal in structure. Trivial mitral valve regurgitation. No evidence of mitral stenosis. 5. The aortic valve is grossly normal. There is mild calcification of the aortic valve. There is mild thickening of the aortic valve. Aortic valve regurgitation is not visualized. Aortic valve sclerosis is present, with no evidence of aortic valve stenosis. 6. The inferior vena cava is normal in size with <50% respiratory variability, suggesting right atrial pressure of 8 mmHg.  Comparison(s): Prior images reviewed side by side. Changes from prior study are noted. EF mildly reduced compared to prior. Patient also in atrial fibrillation on current study.  FINDINGS Left Ventricle: Left  ventricular ejection fraction, by estimation, is 40 to 45%. The left ventricle has mildly decreased function. The left ventricle demonstrates global hypokinesis. The left ventricular internal cavity size was normal in size. There is mild concentric left ventricular hypertrophy. Left ventricular diastolic function could not be evaluated due to atrial fibrillation. Left ventricular diastolic function could not be evaluated.  Right Ventricle: The right ventricular size is normal. No increase in right ventricular wall thickness. Right ventricular systolic function is normal. There is normal pulmonary artery systolic pressure. The tricuspid regurgitant velocity is 2.44 m/s, and with an assumed right atrial pressure of 8 mmHg, the estimated right ventricular systolic pressure is 31.8 mmHg.  Left Atrium: Left atrial size was mild to moderately dilated.  Right Atrium: Right atrial size was normal in size.  Pericardium: There is no evidence of pericardial effusion.  Mitral Valve: The mitral valve is normal in structure. Trivial mitral valve regurgitation. No evidence of mitral valve stenosis.  Tricuspid Valve: The tricuspid valve is normal in structure. Tricuspid valve regurgitation is mild . No evidence of tricuspid stenosis.  Aortic Valve: The aortic valve is grossly normal. There is mild calcification of the aortic valve. There is mild thickening of the aortic valve. Aortic valve regurgitation is not visualized. Aortic valve sclerosis is present, with no evidence of aortic valve stenosis.  Pulmonic Valve: The pulmonic valve was not well visualized. Pulmonic valve regurgitation is trivial. No evidence of pulmonic stenosis.  Aorta:  Weight:       178.0 lb Date of Birth:  1932-07-22        BSA:          1.966 m Patient Age:    89 years         BP:           134/65 mmHg Patient Gender: M                HR:           59 bpm. Exam Location:  [address]  Procedure: 2D Echo, Cardiac Doppler and Color Doppler  Indications:    I48.00 Atrial Fibrillation  History:        Patient has prior history of Echocardiogram examinations, most recent 11/03/2021. CAD, Carotid Disease and CKD, Arrythmias:Bradycardia; Risk Factors:Hypertension and Diabetes.  Sonographer:    Clearence Ped RCS Referring Phys: [phone] Dillon Young  IMPRESSIONS   1. Left ventricular ejection fraction, by estimation, is 40 to 45%. The left ventricle has mildly decreased function. The left ventricle demonstrates global hypokinesis. There is mild concentric left ventricular hypertrophy. Left ventricular diastolic function could not be evaluated. 2. Right ventricular systolic function is normal. The right ventricular size is normal. There is normal pulmonary artery systolic pressure. The estimated right ventricular systolic pressure is 31.8 mmHg. 3. Left atrial size was mild to moderately dilated. 4. The mitral valve is normal in structure. Trivial mitral valve regurgitation. No evidence of mitral stenosis. 5. The aortic valve is grossly normal. There is mild calcification of the aortic valve. There is mild thickening of the aortic valve. Aortic valve regurgitation is not visualized. Aortic valve sclerosis is present, with no evidence of aortic valve stenosis. 6. The inferior vena cava is normal in size with <50% respiratory variability, suggesting right atrial pressure of 8 mmHg.  Comparison(s): Prior images reviewed side by side. Changes from prior study are noted. EF mildly reduced compared to prior. Patient also in atrial fibrillation on current study.  FINDINGS Left Ventricle: Left  ventricular ejection fraction, by estimation, is 40 to 45%. The left ventricle has mildly decreased function. The left ventricle demonstrates global hypokinesis. The left ventricular internal cavity size was normal in size. There is mild concentric left ventricular hypertrophy. Left ventricular diastolic function could not be evaluated due to atrial fibrillation. Left ventricular diastolic function could not be evaluated.  Right Ventricle: The right ventricular size is normal. No increase in right ventricular wall thickness. Right ventricular systolic function is normal. There is normal pulmonary artery systolic pressure. The tricuspid regurgitant velocity is 2.44 m/s, and with an assumed right atrial pressure of 8 mmHg, the estimated right ventricular systolic pressure is 31.8 mmHg.  Left Atrium: Left atrial size was mild to moderately dilated.  Right Atrium: Right atrial size was normal in size.  Pericardium: There is no evidence of pericardial effusion.  Mitral Valve: The mitral valve is normal in structure. Trivial mitral valve regurgitation. No evidence of mitral valve stenosis.  Tricuspid Valve: The tricuspid valve is normal in structure. Tricuspid valve regurgitation is mild . No evidence of tricuspid stenosis.  Aortic Valve: The aortic valve is grossly normal. There is mild calcification of the aortic valve. There is mild thickening of the aortic valve. Aortic valve regurgitation is not visualized. Aortic valve sclerosis is present, with no evidence of aortic valve stenosis.  Pulmonic Valve: The pulmonic valve was not well visualized. Pulmonic valve regurgitation is trivial. No evidence of pulmonic stenosis.  Aorta:  CAROTID;  Surgeon: Larina Earthly, MD;  Location: [facility] OR;  Service: Vascular;  Laterality: Right;   ESOPHAGOGASTRODUODENOSCOPY (EGD) WITH ESOPHAGEAL DILATION     HEMORRHOID BANDING     LEFT HEART CATH AND CORONARY ANGIOGRAPHY N/A 12/26/2017   Procedure: LEFT HEART CATH AND CORONARY ANGIOGRAPHY;  Surgeon: Runell Gess, MD;  Location: [facility];  Service: Cardiovascular;  Laterality: N/A;   LITHOTRIPSY  X 3   LUMBAR LAMINECTOMY     TRANSURETHRAL RESECTION OF BLADDER TUMOR WITH MITOMYCIN-C Bilateral 01/06/2021   Procedure: TRANSURETHRAL RESECTION OF BLADDER TUMOR WITH GEMCITABINE BILATERAL RETROGRADES;  Surgeon: Bjorn Pippin, MD;   Location: [facility];  Service: Urology;  Laterality: Bilateral;   TRANSURETHRAL RESECTION OF PROSTATE  10/2003   TRANSURETHRAL RESECTION OF PROSTATE N/A 05/31/2020   Procedure: CYSTOSCOPY TRANSURETHRAL RESECTION BIOPSY AND FULGURATION OF PROSTATE LESION  (TURP);  Surgeon: Bjorn Pippin, MD;  Location: [facility];  Service: Urology;  Laterality: N/A;    Allergies  Allergies  Allergen Reactions   Lipitor [Atorvastatin] Other (See Comments)    Muscle pain   Other Rash and Other (See Comments)    Detergent- Rashes on the skin     Labs/Other Studies Reviewed    The following studies were reviewed today:  Cardiac Studies & Procedures   CARDIAC CATHETERIZATION  CARDIAC CATHETERIZATION 12/26/2017  Narrative Images from the original result were not included.   Previously placed Prox RCA to Mid RCA stent (unknown type) is widely patent.  Prox LAD to Mid LAD lesion is 80% stenosed.  A stent was successfully placed.  Post intervention, there is a 0% residual stenosis.  There is severe left ventricular systolic dysfunction.  LV end diastolic pressure is mildly elevated.  The left ventricular ejection fraction is less than 25% by visual estimate.  Dillon Young is a 89 y.o. male   [id number] LOCATION:  FACILITY: [facility] PHYSICIAN: Nanetta Batty, M.D. 1932-09-27   DATE OF PROCEDURE:  12/26/2017  DATE OF DISCHARGE:     CARDIAC CATHETERIZATION / PCI DES LAD    History obtained from chart review.Dillon Young is a 89 y.o.  married Caucasian male father of 2 children, grandfather to 2 grandchildren he was formally a patient of Dr. Rocco Serene. I last saw him in the office    10/02/2017. 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 He works out 5 days a week and has joined [employer]. He has a history of coronary artery disease status post angioplasty in 1994 and 1995  Of his circumflex coronary artery. He has not  required cardiac catheterization since stress Myoview was performed in 2011 that was low risk and nonischemic. He has a history of hypertension and hyperlipidemia on medication. He had right carotid endarterectomy performed by Dr. Tawanna Cooler Early June 2013 followed by duplex ultrasound. He denies chest pain or shortness of breath. His major complaint is of cramping in his thighs which may be related to his statin drug. He was given a statin holiday which really did not impact his symptoms and therefore statin was restarted and follow-up lipid profile performed 07/05/14 revealed a total cholesterol 131, LDL 58 and HDL of 44. Recent carotid Dopplers revealed his endarterectomy site was widely patent with moderate left ICA stenosis and moderate left subclavian artery stenosis without symptoms. He developed symptoms in August of this year and saw Nada Boozer. A Myoview stress test performed 01/09/16 was notable  Weight:       178.0 lb Date of Birth:  1932-07-22        BSA:          1.966 m Patient Age:    89 years         BP:           134/65 mmHg Patient Gender: M                HR:           59 bpm. Exam Location:  [address]  Procedure: 2D Echo, Cardiac Doppler and Color Doppler  Indications:    I48.00 Atrial Fibrillation  History:        Patient has prior history of Echocardiogram examinations, most recent 11/03/2021. CAD, Carotid Disease and CKD, Arrythmias:Bradycardia; Risk Factors:Hypertension and Diabetes.  Sonographer:    Clearence Ped RCS Referring Phys: [phone] Dillon Young  IMPRESSIONS   1. Left ventricular ejection fraction, by estimation, is 40 to 45%. The left ventricle has mildly decreased function. The left ventricle demonstrates global hypokinesis. There is mild concentric left ventricular hypertrophy. Left ventricular diastolic function could not be evaluated. 2. Right ventricular systolic function is normal. The right ventricular size is normal. There is normal pulmonary artery systolic pressure. The estimated right ventricular systolic pressure is 31.8 mmHg. 3. Left atrial size was mild to moderately dilated. 4. The mitral valve is normal in structure. Trivial mitral valve regurgitation. No evidence of mitral stenosis. 5. The aortic valve is grossly normal. There is mild calcification of the aortic valve. There is mild thickening of the aortic valve. Aortic valve regurgitation is not visualized. Aortic valve sclerosis is present, with no evidence of aortic valve stenosis. 6. The inferior vena cava is normal in size with <50% respiratory variability, suggesting right atrial pressure of 8 mmHg.  Comparison(s): Prior images reviewed side by side. Changes from prior study are noted. EF mildly reduced compared to prior. Patient also in atrial fibrillation on current study.  FINDINGS Left Ventricle: Left  ventricular ejection fraction, by estimation, is 40 to 45%. The left ventricle has mildly decreased function. The left ventricle demonstrates global hypokinesis. The left ventricular internal cavity size was normal in size. There is mild concentric left ventricular hypertrophy. Left ventricular diastolic function could not be evaluated due to atrial fibrillation. Left ventricular diastolic function could not be evaluated.  Right Ventricle: The right ventricular size is normal. No increase in right ventricular wall thickness. Right ventricular systolic function is normal. There is normal pulmonary artery systolic pressure. The tricuspid regurgitant velocity is 2.44 m/s, and with an assumed right atrial pressure of 8 mmHg, the estimated right ventricular systolic pressure is 31.8 mmHg.  Left Atrium: Left atrial size was mild to moderately dilated.  Right Atrium: Right atrial size was normal in size.  Pericardium: There is no evidence of pericardial effusion.  Mitral Valve: The mitral valve is normal in structure. Trivial mitral valve regurgitation. No evidence of mitral valve stenosis.  Tricuspid Valve: The tricuspid valve is normal in structure. Tricuspid valve regurgitation is mild . No evidence of tricuspid stenosis.  Aortic Valve: The aortic valve is grossly normal. There is mild calcification of the aortic valve. There is mild thickening of the aortic valve. Aortic valve regurgitation is not visualized. Aortic valve sclerosis is present, with no evidence of aortic valve stenosis.  Pulmonic Valve: The pulmonic valve was not well visualized. Pulmonic valve regurgitation is trivial. No evidence of pulmonic stenosis.  Aorta:  Weight:       178.0 lb Date of Birth:  1932-07-22        BSA:          1.966 m Patient Age:    89 years         BP:           134/65 mmHg Patient Gender: M                HR:           59 bpm. Exam Location:  [address]  Procedure: 2D Echo, Cardiac Doppler and Color Doppler  Indications:    I48.00 Atrial Fibrillation  History:        Patient has prior history of Echocardiogram examinations, most recent 11/03/2021. CAD, Carotid Disease and CKD, Arrythmias:Bradycardia; Risk Factors:Hypertension and Diabetes.  Sonographer:    Clearence Ped RCS Referring Phys: [phone] Dillon Young  IMPRESSIONS   1. Left ventricular ejection fraction, by estimation, is 40 to 45%. The left ventricle has mildly decreased function. The left ventricle demonstrates global hypokinesis. There is mild concentric left ventricular hypertrophy. Left ventricular diastolic function could not be evaluated. 2. Right ventricular systolic function is normal. The right ventricular size is normal. There is normal pulmonary artery systolic pressure. The estimated right ventricular systolic pressure is 31.8 mmHg. 3. Left atrial size was mild to moderately dilated. 4. The mitral valve is normal in structure. Trivial mitral valve regurgitation. No evidence of mitral stenosis. 5. The aortic valve is grossly normal. There is mild calcification of the aortic valve. There is mild thickening of the aortic valve. Aortic valve regurgitation is not visualized. Aortic valve sclerosis is present, with no evidence of aortic valve stenosis. 6. The inferior vena cava is normal in size with <50% respiratory variability, suggesting right atrial pressure of 8 mmHg.  Comparison(s): Prior images reviewed side by side. Changes from prior study are noted. EF mildly reduced compared to prior. Patient also in atrial fibrillation on current study.  FINDINGS Left Ventricle: Left  ventricular ejection fraction, by estimation, is 40 to 45%. The left ventricle has mildly decreased function. The left ventricle demonstrates global hypokinesis. The left ventricular internal cavity size was normal in size. There is mild concentric left ventricular hypertrophy. Left ventricular diastolic function could not be evaluated due to atrial fibrillation. Left ventricular diastolic function could not be evaluated.  Right Ventricle: The right ventricular size is normal. No increase in right ventricular wall thickness. Right ventricular systolic function is normal. There is normal pulmonary artery systolic pressure. The tricuspid regurgitant velocity is 2.44 m/s, and with an assumed right atrial pressure of 8 mmHg, the estimated right ventricular systolic pressure is 31.8 mmHg.  Left Atrium: Left atrial size was mild to moderately dilated.  Right Atrium: Right atrial size was normal in size.  Pericardium: There is no evidence of pericardial effusion.  Mitral Valve: The mitral valve is normal in structure. Trivial mitral valve regurgitation. No evidence of mitral valve stenosis.  Tricuspid Valve: The tricuspid valve is normal in structure. Tricuspid valve regurgitation is mild . No evidence of tricuspid stenosis.  Aortic Valve: The aortic valve is grossly normal. There is mild calcification of the aortic valve. There is mild thickening of the aortic valve. Aortic valve regurgitation is not visualized. Aortic valve sclerosis is present, with no evidence of aortic valve stenosis.  Pulmonic Valve: The pulmonic valve was not well visualized. Pulmonic valve regurgitation is trivial. No evidence of pulmonic stenosis.  Aorta:  CAROTID;  Surgeon: Larina Earthly, MD;  Location: [facility] OR;  Service: Vascular;  Laterality: Right;   ESOPHAGOGASTRODUODENOSCOPY (EGD) WITH ESOPHAGEAL DILATION     HEMORRHOID BANDING     LEFT HEART CATH AND CORONARY ANGIOGRAPHY N/A 12/26/2017   Procedure: LEFT HEART CATH AND CORONARY ANGIOGRAPHY;  Surgeon: Runell Gess, MD;  Location: [facility] INVASIVE CV LAB;  Service: Cardiovascular;  Laterality: N/A;   LITHOTRIPSY  X 3   LUMBAR LAMINECTOMY     TRANSURETHRAL RESECTION OF BLADDER TUMOR WITH MITOMYCIN-C Bilateral 01/06/2021   Procedure: TRANSURETHRAL RESECTION OF BLADDER TUMOR WITH GEMCITABINE BILATERAL RETROGRADES;  Surgeon: Bjorn Pippin, MD;   Location: [facility] ORS;  Service: Urology;  Laterality: Bilateral;   TRANSURETHRAL RESECTION OF PROSTATE  10/2003   TRANSURETHRAL RESECTION OF PROSTATE N/A 05/31/2020   Procedure: CYSTOSCOPY TRANSURETHRAL RESECTION BIOPSY AND FULGURATION OF PROSTATE LESION  (TURP);  Surgeon: Bjorn Pippin, MD;  Location: [facility] ORS;  Service: Urology;  Laterality: N/A;    Allergies  Allergies  Allergen Reactions   Lipitor [Atorvastatin] Other (See Comments)    Muscle pain   Other Rash and Other (See Comments)    Detergent- Rashes on the skin     Labs/Other Studies Reviewed    The following studies were reviewed today:  Cardiac Studies & Procedures   CARDIAC CATHETERIZATION  CARDIAC CATHETERIZATION 12/26/2017  Narrative Images from the original result were not included.   Previously placed Prox RCA to Mid RCA stent (unknown type) is widely patent.  Prox LAD to Mid LAD lesion is 80% stenosed.  A stent was successfully placed.  Post intervention, there is a 0% residual stenosis.  There is severe left ventricular systolic dysfunction.  LV end diastolic pressure is mildly elevated.  The left ventricular ejection fraction is less than 25% by visual estimate.  Dillon Young is a 89 y.o. male   [id number] LOCATION:  FACILITY: [facility] PHYSICIAN: Nanetta Batty, M.D. 1932-09-27   DATE OF PROCEDURE:  12/26/2017  DATE OF DISCHARGE:     CARDIAC CATHETERIZATION / PCI DES LAD    History obtained from chart review.Dillon Young is a 89 y.o.  married Caucasian male father of 2 children, grandfather to 2 grandchildren he was formally a patient of Dr. Rocco Serene. I last saw him in the office    10/02/2017. 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 He works out 5 days a week and has joined [employer]. He has a history of coronary artery disease status post angioplasty in 1994 and 1995  Of his circumflex coronary artery. He has not  required cardiac catheterization since stress Myoview was performed in 2011 that was low risk and nonischemic. He has a history of hypertension and hyperlipidemia on medication. He had right carotid endarterectomy performed by Dr. Tawanna Cooler Early June 2013 followed by duplex ultrasound. He denies chest pain or shortness of breath. His major complaint is of cramping in his thighs which may be related to his statin drug. He was given a statin holiday which really did not impact his symptoms and therefore statin was restarted and follow-up lipid profile performed 07/05/14 revealed a total cholesterol 131, LDL 58 and HDL of 44. Recent carotid Dopplers revealed his endarterectomy site was widely patent with moderate left ICA stenosis and moderate left subclavian artery stenosis without symptoms. He developed symptoms in August of this year and saw Nada Boozer. A Myoview stress test performed 01/09/16 was notable

## 2023-03-01 DIAGNOSIS — M5481 Occipital neuralgia: Secondary | ICD-10-CM | POA: Diagnosis not present

## 2023-03-15 ENCOUNTER — Ambulatory Visit: Payer: PPO

## 2023-03-15 DIAGNOSIS — Z23 Encounter for immunization: Secondary | ICD-10-CM | POA: Diagnosis not present

## 2023-03-15 NOTE — Progress Notes (Signed)
Patient presented in office today for high dose flu shot. High dose flu shot was administered into his left deltoid. Patient tolerated injection well and injection site looked fine. Patient was advised to report to the office immediately if he noticed any adverse reaction.

## 2023-03-20 DIAGNOSIS — M5481 Occipital neuralgia: Secondary | ICD-10-CM | POA: Diagnosis not present

## 2023-03-25 ENCOUNTER — Telehealth: Payer: Self-pay | Admitting: Cardiovascular Disease

## 2023-03-25 ENCOUNTER — Encounter: Payer: Self-pay | Admitting: Cardiovascular Disease

## 2023-03-25 NOTE — Telephone Encounter (Signed)
Paper Work Dropped Off: Patient assistance form   Date:03/25/2023  Location of paper:  Dr. Allyson Sabal mail box    Patient came in  requesting to speak to someone regarding patient assistance paperwork.Patient spoke to [facility] -pharmacy.

## 2023-03-25 NOTE — Telephone Encounter (Signed)
ptp

## 2023-03-31 ENCOUNTER — Other Ambulatory Visit: Payer: Self-pay | Admitting: Internal Medicine

## 2023-03-31 DIAGNOSIS — E1140 Type 2 diabetes mellitus with diabetic neuropathy, unspecified: Secondary | ICD-10-CM

## 2023-04-11 ENCOUNTER — Ambulatory Visit (HOSPITAL_COMMUNITY)
Admission: RE | Admit: 2023-04-11 | Discharge: 2023-04-11 | Disposition: A | Discharge status: Home / Self Care | Payer: PPO | Source: Ambulatory Visit | Attending: Internal Medicine | Admitting: Internal Medicine

## 2023-04-11 VITALS — BP 164/86 | HR 84 | Ht 69.0 in | Wt 174.2 lb

## 2023-04-11 DIAGNOSIS — N189 Chronic kidney disease, unspecified: Secondary | ICD-10-CM | POA: Insufficient documentation

## 2023-04-11 DIAGNOSIS — I509 Heart failure, unspecified: Secondary | ICD-10-CM | POA: Diagnosis not present

## 2023-04-11 DIAGNOSIS — D6869 Other thrombophilia: Secondary | ICD-10-CM

## 2023-04-11 DIAGNOSIS — E785 Hyperlipidemia, unspecified: Secondary | ICD-10-CM | POA: Diagnosis not present

## 2023-04-11 DIAGNOSIS — I4821 Permanent atrial fibrillation: Secondary | ICD-10-CM | POA: Insufficient documentation

## 2023-04-11 DIAGNOSIS — E1122 Type 2 diabetes mellitus with diabetic chronic kidney disease: Secondary | ICD-10-CM | POA: Diagnosis not present

## 2023-04-11 DIAGNOSIS — I4891 Unspecified atrial fibrillation: Secondary | ICD-10-CM | POA: Diagnosis not present

## 2023-04-11 DIAGNOSIS — I130 Hypertensive heart and chronic kidney disease with heart failure and stage 1 through stage 4 chronic kidney disease, or unspecified chronic kidney disease: Secondary | ICD-10-CM | POA: Diagnosis not present

## 2023-04-11 DIAGNOSIS — Z79899 Other long term (current) drug therapy: Secondary | ICD-10-CM | POA: Insufficient documentation

## 2023-04-11 DIAGNOSIS — I2510 Atherosclerotic heart disease of native coronary artery without angina pectoris: Secondary | ICD-10-CM | POA: Insufficient documentation

## 2023-04-11 NOTE — Progress Notes (Signed)
Primary Care Physician: Etta Grandchild, MD Referring Physician:Dr. 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 . He has a history of hypertension and hyperlipidemia on medication. He had right carotid endarterectomy performed by Dr. Gretta Began June 2013.Myoview stress test performed 01/09/16 was notable for ischemia in the RCA distribution. Based on this he underwent cardiac catheterization by Dr. Eldridge Dace 01/16/16 revealing a 90% segmental mid dominant RCA stenosis which was stented with a Synergy 3.5 mm x 20 mm long drug-eluting stent. He did have a very small ramus branch that was 90% stenosed and not suitable for intervention as well as a 70% mid LAD lesion although he had no evidence of anterior ischemia on his stress test. His symptoms of chest pain have resolved.   He was diagnosed in May 2019 with new  onset  afib from which the pt is asymptomatic. Echo was done and showed EF decreased to 30-35%, normal EF by echo in August 2017. He has been on Eliquis 5 mg bid since early May without missed doses.  Plavix were stopped at that time, ASA continued and pt was started on carvedilol 6.25 mg bid.He states that he had a sleep study in the past but never slept enough with the study to get good results. He is afraid that he would never be able to tolerate a cpap. Sleeps in a recliner now, " I just feel more comfortable".  F/u in afib clinic 10/03/17, unfortunately cardioversion only held for 1-2 days.He developed  exertional chest discomfort   and was seen by R. Barrett, PA and had a cardiac cath which pt did receive a stent in the LAD. He feels improved but still remains in afib.  He did have to hold anticoagulation for 1-2 days and was back on anticoagulation without interruption on 8/23.  We did discuss trying to restore SR with EF moderately reduced and he would like to purse  after his vacation at the end of the month.  F/u in afib clinic, 01/31/18. He is back in afib clinic to discuss Tikosyn admit.  He has recently seen Dr. Shirlee Latch and he also encouraged pt to consider Tiksoyn for his LV dysfunction.Marland Kitchen He is feeling better and was hoping he could get by without drug. He will commit but wants to wait until 10/1. He also reports that he has been having h/a's ever since he started Imdur. I discussed with Cicero Duck, PharmD, and  Imdur was eventually stopped with resolution of H/A.  Mr. Blauser is now back in afib clinic for tikosyn admit.  No misses eliquis doses and no benadryl use.PharmD has screened drugs and no qt prolonging drugs. He will be applying for drug assistance and papers received and reviewed.  F/u 10/28, s/p Tikosyn admit. He is in SR with 125 mcg of Tikosyn. He will be getting drug thru pharmacy  assistance program. He feels improved.  F/u Tikosyn 11/21, he continues in SR. He has a sinus infection and is taking cortisone and antibiotic currently. qtc 463 ms. By Raynelle Fanning he appears to have bigeminal PVC's but rhythm was regular when auscultated.  F/u in  afib clinic, 07/26/22. He was sent her by Dr. Allyson Sabal as he found him in afib  2/21. He is rate controled and minimally symptomatic with fatigue . He continues on tikosyn 125 mcg bid. EKG today shows rate controlled afib.  F/u in afib clinic, 08/07/22. He returned to afib in 3 days after CV. He feels weak in afib. We discussed today  tikosyn seems to have lost its effectiveness to control afib and to wash out Tikosyn and load amiodarone with a repeat CV in one month if he wants to restore SR  and he is in agreement,  risk vrs benefit of drug explained  and he wants to proceed. Most recent TSH/cmet/CXR in Epic reviewed.   F/u in afib clinic, 09/12/22. He is now s/p DCCV on 4/15 with successful conversion to SR x 1 shock. He is currently in SR today. Low HR noted today. He does feel tired, some head tightness. No missed doses of  anticoagulant.  F/u in afib clinic, 09/27/22. He is currently out of rhythm today since transition from amio 200 mg BID to once daily. He feels well overall and HR improved. Occasionally feels fluttering at night but this is seldom. No missed doses of anticoagulant.  F/u in Afib clinic, 04/11/23. He is currently in rate controlled Afib. He feels okay overall. He can do his daily activities without issue and walks with his wife 4 days a week. Highest BP at home is systolic 138; HR 56-21H; systolic BP 110-120s. No bleeding issues on Eliquis.  Today, he denies symptoms of palpitations, shortness of breath, orthopnea, PND, lower extremity edema, dizziness, presyncope, syncope, or neurologic sequela.  The patient is tolerating medications without difficulties and is otherwise without complaint today.   Past Medical History:  Diagnosis Date   Adenomatous colon polyp    Allergic rhinitis    Anemia    Anxiety    Bladder cancer (HCC)    currently on bcg treatments   BPH (benign prostatic hyperplasia)    CAD (coronary artery disease)    sees Dr. Nanetta Batty    Carotid artery disease [facility])    CHF (congestive heart failure) (HCC)    Chronic kidney disease    Sones. Prostate cancer   Clotting disorder (HCC)    CVA (cerebral infarction)    Diabetes mellitus    "Borderline"   Diverticulitis    Dysrhythmia    atrial fib - hx of    Edema    recent swelling in feet and ankles    GERD (gastroesophageal reflux disease)    history   Headache    Hemorrhoids    History of kidney stones    HTN (hypertension)    Hx of radiation therapy    for prostate - 2013    Hyperlipidemia LDL goal <70    IBS (irritable bowel syndrome)    Low back pain    Pre-diabetes    Prostate cancer (HCC) 2008   seed implantation   Sleep apnea    sitting in recliner helps him to breathe per pt , never used cpap sleep study - pt reports could not go to sleep- 6 years ago    Smoker    Stroke [facility])    hx of mini strokes  years ago    Past Surgical History:  Procedure Laterality Date   APPENDECTOMY     CARDIAC CATHETERIZATION N/A 01/16/2016   Procedure: Left Heart Cath and Coronary Angiography;  Surgeon: Corky Crafts, MD;  Location: [facility] INVASIVE CV LAB;  Service: Cardiovascular;  Laterality: N/A;   CARDIAC CATHETERIZATION N/A 01/16/2016   Procedure: Coronary Stent Intervention;  Surgeon: Corky Crafts, MD;  Location: [facility] INVASIVE CV LAB;  Service: Cardiovascular;  Laterality: N/A;   CARDIOVERSION  N/A 11/26/2017   Procedure: CARDIOVERSION;  Surgeon: Chrystie Nose, MD;  Location: [facility] ENDOSCOPY;  Service: Cardiovascular;  Laterality: N/A;   CARDIOVERSION N/A 03/13/2018   Procedure: CARDIOVERSION;  Surgeon: Chrystie Nose, MD;  Location: [facility] ENDOSCOPY;  Service: Cardiovascular;  Laterality: N/A;   CARDIOVERSION N/A 07/31/2022   Procedure: CARDIOVERSION;  Surgeon: Lewayne Bunting, MD;  Location: [facility] ENDOSCOPY;  Service: Cardiovascular;  Laterality: N/A;   CARDIOVERSION N/A 09/10/2022   Procedure: CARDIOVERSION;  Surgeon: Pricilla Riffle, MD;  Location: [facility] INVASIVE CV LAB;  Service: Cardiovascular;  Laterality: N/A;   CATARACT EXTRACTION W/ INTRAOCULAR LENS  IMPLANT, BILATERAL Bilateral    COLONOSCOPY  10/21/2008   diverticulosis, radiation proctitis, internal hemorrhoids   CORONARY ANGIOPLASTY     CORONARY STENT INTERVENTION N/A 12/26/2017   Procedure: CORONARY STENT INTERVENTION;  Surgeon: Runell Gess, MD;  Location: [facility] INVASIVE CV LAB;  Service: Cardiovascular;  Laterality: N/A;   ENDARTERECTOMY  11/12/2011   Procedure: ENDARTERECTOMY CAROTID;  Surgeon: Larina Earthly, MD;  Location: [facility] OR;  Service: Vascular;  Laterality: Right;   ESOPHAGOGASTRODUODENOSCOPY (EGD) WITH ESOPHAGEAL DILATION     HEMORRHOID BANDING     LEFT HEART CATH AND CORONARY ANGIOGRAPHY N/A 12/26/2017   Procedure: LEFT HEART CATH AND CORONARY ANGIOGRAPHY;  Surgeon: Runell Gess, MD;  Location: [facility] INVASIVE CV LAB;  Service:  Cardiovascular;  Laterality: N/A;   LITHOTRIPSY  X 3   LUMBAR LAMINECTOMY     TRANSURETHRAL RESECTION OF BLADDER TUMOR WITH MITOMYCIN-C Bilateral 01/06/2021   Procedure: TRANSURETHRAL RESECTION OF BLADDER TUMOR WITH GEMCITABINE BILATERAL RETROGRADES;  Surgeon: Bjorn Pippin, MD;  Location: [facility] ORS;  Service: Urology;  Laterality: Bilateral;   TRANSURETHRAL RESECTION OF PROSTATE  10/2003   TRANSURETHRAL RESECTION OF PROSTATE N/A 05/31/2020   Procedure: CYSTOSCOPY TRANSURETHRAL RESECTION BIOPSY AND FULGURATION OF PROSTATE LESION  (TURP);  Surgeon: Bjorn Pippin, MD;  Location: [facility] ORS;  Service: Urology;  Laterality: N/A;    Current Outpatient Medications  Medication Sig Dispense Refill   acetaminophen (TYLENOL) 500 MG tablet Take 500-1,000 mg by mouth daily as needed for mild pain or headache.     apixaban (ELIQUIS) 5 MG TABS tablet TAKE 1 TABLET BY MOUTH TWICE A DAY 180 tablet 1   ascorbic acid (VITAMIN C) 500 MG tablet Take 500 mg by mouth 2 (two) times a week.     carvedilol (COREG) 3.125 MG tablet Take 1 tablet (3.125 mg total) by mouth 2 (two) times daily with a meal. 60 tablet 6   Cholecalciferol (VITAMIN D-3) 25 MCG (1000 UT) CAPS Take 1,000 Units by mouth daily.     dicyclomine (BENTYL) 10 MG capsule TAKE 1 CAPSULE (10 MG TOTAL) BY MOUTH EVERY 6 (SIX) HOURS AS NEEDED (ABDOMINAL PAIN). 30 capsule 1   fexofenadine (ALLEGRA) 180 MG tablet Take 180 mg by mouth daily as needed for allergies or rhinitis.      fluticasone (FLONASE) 50 MCG/ACT nasal spray Place 2 sprays into both nostrils daily. 16 g 6   gabapentin (NEURONTIN) 300 MG capsule Take 1 capsule (300 mg total) by mouth 3 (three) times daily. (Patient taking differently: Take 300 mg by mouth as needed.) 270 capsule 1   hydrALAZINE (APRESOLINE) 25 MG tablet Take 25 mg by mouth 3 (three) times daily as needed (As need when BP is 160).     hydrocortisone (ANUSOL-HC) 2.5 % rectal cream Place 1 application rectally 3 (three) times daily as needed for  hemorrhoids. 30 g 1  Hypromellose (ARTIFICIAL TEARS OP) Place 1 drop into both eyes 4 (four) times daily as needed (dry eyes).     ipratropium (ATROVENT) 0.06 % nasal spray Place 2 sprays into both nostrils 2 (two) times daily. 45 mL 1   melatonin 5 MG TABS Take 5 mg by mouth at bedtime as needed (sleep).     nitroGLYCERIN (NITROSTAT) 0.4 MG SL tablet Place 1 tablet (0.4 mg total) under the tongue every 5 (five) minutes as needed for chest pain. 25 tablet 3   rosuvastatin (CRESTOR) 10 MG tablet TAKE 1 TABLET BY MOUTH EVERY DAY 90 tablet 1   sacubitril-valsartan (ENTRESTO) 97-103 MG Take 1 tablet by mouth 2 (two) times daily. 180 tablet 3   spironolactone (ALDACTONE) 25 MG tablet Take 1 tablet (25 mg total) by mouth daily. (Patient taking differently: Take 12.5 mg by mouth 2 (two) times daily.) 90 tablet 3   Tiotropium Bromide-Olodaterol (STIOLTO RESPIMAT) 2.5-2.5 MCG/ACT AERS Inhale 2 puffs into the lungs daily. 4 g 11   triamcinolone cream (KENALOG) 0.1 % Apply 1 application topically daily as needed (to affected areas, for itching).      No current facility-administered medications for this encounter.    Allergies  Allergen Reactions   Lipitor [Atorvastatin] Other (See Comments)    Muscle pain   Other Rash and Other (See Comments)    Detergent- Rashes on the skin   ROS- All systems are reviewed and negative except as per the HPI above  Physical Exam: Vitals:   04/11/23 0950  BP: (!) 164/86  Pulse: 84  Weight: 79 kg  Height: 5\' 9"  (1.753 m)     Wt Readings from Last 3 Encounters:  04/11/23 79 kg  02/28/23 78 kg  02/25/23 78.9 kg    Labs: Lab Results  Component Value Date   NA 137 02/25/2023   K 5.0 02/25/2023   CL 103 02/25/2023   CO2 25 02/25/2023   GLUCOSE 115 (H) 02/25/2023   BUN 25 (H) 02/25/2023   CREATININE 1.25 (H) 02/25/2023   CALCIUM 9.0 02/25/2023   MG 2.1 07/26/2022   Lab Results  Component Value Date   INR 1.2 02/10/2022   Lab Results  Component  Value Date   CHOL 129 10/18/2021   HDL 45 10/18/2021   LDLCALC 60 10/18/2021   TRIG 118 10/18/2021    GEN- The patient is well appearing, alert and oriented x 3 today.   Neck - no JVD or carotid bruit noted Lungs- Clear to ausculation bilaterally, normal work of breathing Heart- Irregular rate and rhythm, no murmurs, rubs or gallops, PMI not laterally displaced Extremities- no clubbing, cyanosis, or edema Skin - no rash or ecchymosis noted   EKG-  Vent. rate 84 BPM PR interval * ms QRS duration 92 ms QT/QTcB 368/434 ms P-R-T axes * -63 45 Atrial fibrillation with premature ventricular or aberrantly conducted complexes Left axis deviation Abnormal ECG When compared with ECG of 10-Oct-2022 13:27, PREVIOUS ECG IS PRESENT   Echo 08/22/22:  1. Left ventricular ejection fraction, by estimation, is 40 to 45%. The  left ventricle has mildly decreased function. The left ventricle  demonstrates global hypokinesis. There is mild concentric left ventricular  hypertrophy. Left ventricular diastolic  function could not be evaluated.   2. Right ventricular systolic function is normal. The right ventricular  size is normal. There is normal pulmonary artery systolic pressure. The  estimated right ventricular systolic pressure is 31.8 mmHg.   3. Left atrial size was mild  to moderately dilated.   4. The mitral valve is normal in structure. Trivial mitral valve  regurgitation. No evidence of mitral stenosis.   5. The aortic valve is grossly normal. There is mild calcification of the  aortic valve. There is mild thickening of the aortic valve. Aortic valve  regurgitation is not visualized. Aortic valve sclerosis is present, with  no evidence of aortic valve  stenosis.   6. The inferior vena cava is normal in size with <50% respiratory  variability, suggesting right atrial pressure of 8 mmHg.   Comparison(s): Prior images reviewed side by side. Changes from prior  study are noted. EF mildly  reduced compared to prior. Patient also in  atrial fibrillation on current study.   Assessment and Plan: 1. Atrial fibrillation - diagnosed May 2019 Has been maintained in SR on Tikosyn 125 mcg bid for many years and most recent CV did not hold SR for more than 3 days Found to be in rate controlled afib, symptomatic with fatigue  Cardioversion was successful but ERAF after 3 days Discussed washing out Tikosyn and starting amiodarone 200 mg bid x one month and then planning on CV again if needed  Risk vrs benefit of drug discussed in detail with wife and pt and he wants to proceed  Continue carvedilol 3.125 gm bid  He was started on K+ when on Tikosyn last week prior to CV, but will now stop it as K+ is ok for amiodarone at previous 3.7. He is s/p DCCV on 09/10/22 with ERAF noted.   He is currently in rate controlled Afib and declines further intervention at this time. He is not on AAD therapy and will continue coreg 3.125 mg BID. No changes at this time.    2. CHA2DS2VASc  score of at least 5 Continue 5 mg bid  No missed doses    3. CAD Stable   Follow up Afib clinic 1 year.   L[place] [name], PA-C Afib Clinic [facility] [address] [place], [place] [address] [phone]

## 2023-04-16 ENCOUNTER — Encounter: Payer: Self-pay | Admitting: Cardiovascular Disease

## 2023-04-16 NOTE — Telephone Encounter (Signed)
Patient dropped off above mentioned application  In providers box

## 2023-04-30 ENCOUNTER — Encounter: Payer: Self-pay | Admitting: Nurse Practitioner

## 2023-04-30 ENCOUNTER — Ambulatory Visit: Payer: PPO | Attending: Nurse Practitioner | Admitting: Nurse Practitioner

## 2023-04-30 VITALS — BP 108/48 | HR 74 | Ht 68.0 in | Wt 175.8 lb

## 2023-04-30 DIAGNOSIS — I2510 Atherosclerotic heart disease of native coronary artery without angina pectoris: Secondary | ICD-10-CM | POA: Diagnosis not present

## 2023-04-30 DIAGNOSIS — E785 Hyperlipidemia, unspecified: Secondary | ICD-10-CM | POA: Diagnosis not present

## 2023-04-30 DIAGNOSIS — I6523 Occlusion and stenosis of bilateral carotid arteries: Secondary | ICD-10-CM

## 2023-04-30 DIAGNOSIS — I10 Essential (primary) hypertension: Secondary | ICD-10-CM

## 2023-04-30 DIAGNOSIS — I4821 Permanent atrial fibrillation: Secondary | ICD-10-CM

## 2023-04-30 DIAGNOSIS — I5022 Chronic systolic (congestive) heart failure: Secondary | ICD-10-CM

## 2023-04-30 NOTE — Progress Notes (Addendum)
Office Visit    Patient Name: Dillon Young Date of Encounter: 04/30/2023  Primary Care Provider:  Etta Grandchild, MD Primary Cardiologist:  Nanetta Batty, MD  Chief Complaint    89 year old male with a history of CAD s/p DES-mRCA in 2017, DES-mLAD in 2019, chronic systolic heart failure, permanent atrial fibrillation, carotid artery stenosis s/p right CEA, hypertension, hyperlipidemia, and bladder cancer who presents for follow-up related to CAD, heart failure, and atrial fibrillation.   Past Medical History    Past Medical History:  Diagnosis Date   Adenomatous colon polyp    Allergic rhinitis    Anemia    Anxiety    Bladder cancer (HCC)    currently on bcg treatments   BPH (benign prostatic hyperplasia)    CAD (coronary artery disease)    sees Dr. Nanetta Batty    Carotid artery disease [facility])    CHF (congestive heart failure) (HCC)    Chronic kidney disease    Sones. Prostate cancer   Clotting disorder (HCC)    CVA (cerebral infarction)    Diabetes mellitus    "Borderline"   Diverticulitis    Dysrhythmia    atrial fib - hx of    Edema    recent swelling in feet and ankles    GERD (gastroesophageal reflux disease)    history   Headache    Hemorrhoids    History of kidney stones    HTN (hypertension)    Hx of radiation therapy    for prostate - 2013    Hyperlipidemia LDL goal <70    IBS (irritable bowel syndrome)    Low back pain    Pre-diabetes    Prostate cancer (HCC) 2008   seed implantation   Sleep apnea    sitting in recliner helps him to breathe per pt , never used cpap sleep study - pt reports could not go to sleep- 6 years ago    Smoker    Stroke [facility])    hx of mini strokes years ago    Past Surgical History:  Procedure Laterality Date   APPENDECTOMY     CARDIAC CATHETERIZATION N/A 01/16/2016   Procedure: Left Heart Cath and Coronary Angiography;  Surgeon: Corky Crafts, MD;  Location: [facility] INVASIVE CV LAB;  Service: Cardiovascular;   Laterality: N/A;   CARDIAC CATHETERIZATION N/A 01/16/2016   Procedure: Coronary Stent Intervention;  Surgeon: Corky Crafts, MD;  Location: [facility] INVASIVE CV LAB;  Service: Cardiovascular;  Laterality: N/A;   CARDIOVERSION N/A 11/26/2017   Procedure: CARDIOVERSION;  Surgeon: Chrystie Nose, MD;  Location: [facility] ENDOSCOPY;  Service: Cardiovascular;  Laterality: N/A;   CARDIOVERSION N/A 03/13/2018   Procedure: CARDIOVERSION;  Surgeon: Chrystie Nose, MD;  Location: [facility] ENDOSCOPY;  Service: Cardiovascular;  Laterality: N/A;   CARDIOVERSION N/A 07/31/2022   Procedure: CARDIOVERSION;  Surgeon: Lewayne Bunting, MD;  Location: [facility] ENDOSCOPY;  Service: Cardiovascular;  Laterality: N/A;   CARDIOVERSION N/A 09/10/2022   Procedure: CARDIOVERSION;  Surgeon: Pricilla Riffle, MD;  Location: [facility] INVASIVE CV LAB;  Service: Cardiovascular;  Laterality: N/A;   CATARACT EXTRACTION W/ INTRAOCULAR LENS  IMPLANT, BILATERAL Bilateral    COLONOSCOPY  10/21/2008   diverticulosis, radiation proctitis, internal hemorrhoids   CORONARY ANGIOPLASTY     CORONARY STENT INTERVENTION N/A 12/26/2017   Procedure: CORONARY STENT INTERVENTION;  Surgeon: Runell Gess, MD;  Location: [facility] INVASIVE CV LAB;  Service: Cardiovascular;  Laterality: N/A;   ENDARTERECTOMY  11/12/2011   Procedure:  ENDARTERECTOMY CAROTID;  Surgeon: Larina Earthly, MD;  Location: [facility] OR;  Service: Vascular;  Laterality: Right;   ESOPHAGOGASTRODUODENOSCOPY (EGD) WITH ESOPHAGEAL DILATION     HEMORRHOID BANDING     LEFT HEART CATH AND CORONARY ANGIOGRAPHY N/A 12/26/2017   Procedure: LEFT HEART CATH AND CORONARY ANGIOGRAPHY;  Surgeon: Runell Gess, MD;  Location: [facility] INVASIVE CV LAB;  Service: Cardiovascular;  Laterality: N/A;   LITHOTRIPSY  X 3   LUMBAR LAMINECTOMY     TRANSURETHRAL RESECTION OF BLADDER TUMOR WITH MITOMYCIN-C Bilateral 01/06/2021   Procedure: TRANSURETHRAL RESECTION OF BLADDER TUMOR WITH GEMCITABINE BILATERAL RETROGRADES;  Surgeon: Bjorn Pippin, MD;   Location: [facility] ORS;  Service: Urology;  Laterality: Bilateral;   TRANSURETHRAL RESECTION OF PROSTATE  10/2003   TRANSURETHRAL RESECTION OF PROSTATE N/A 05/31/2020   Procedure: CYSTOSCOPY TRANSURETHRAL RESECTION BIOPSY AND FULGURATION OF PROSTATE LESION  (TURP);  Surgeon: Bjorn Pippin, MD;  Location: [facility] ORS;  Service: Urology;  Laterality: N/A;    Allergies  Allergies  Allergen Reactions   Lipitor [Atorvastatin] Other (See Comments)    Muscle pain   Other Rash and Other (See Comments)    Detergent- Rashes on the skin     Labs/Other Studies Reviewed    The following studies were reviewed today:  Cardiac Studies & Procedures   CARDIAC CATHETERIZATION  CARDIAC CATHETERIZATION 12/26/2017  Narrative Images from the original result were not included.   Previously placed Prox RCA to Mid RCA stent (unknown type) is widely patent.  Prox LAD to Mid LAD lesion is 80% stenosed.  A stent was successfully placed.  Post intervention, there is a 0% residual stenosis.  There is severe left ventricular systolic dysfunction.  LV end diastolic pressure is mildly elevated.  The left ventricular ejection fraction is less than 25% by visual estimate.  Dillon Young is a 89 y.o. male   [id number] LOCATION:  FACILITY: [facility] PHYSICIAN: Nanetta Batty, M.D. 1932/11/02   DATE OF PROCEDURE:  12/26/2017  DATE OF DISCHARGE:     CARDIAC CATHETERIZATION / PCI DES LAD    History obtained from chart review.Dillon Young is a 89 y.o.  married Caucasian male father of 2 children, grandfather to 2 grandchildren he was formally a patient of Dr. Rocco Serene. I last saw him in the office    10/02/2017. 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 He works out 5 days a week and has joined [employer]. He has a history of coronary artery disease status post angioplasty in 1994 and 1995  Of his circumflex coronary artery. He has not  required cardiac catheterization since stress Myoview was performed in 2011 that was low risk and nonischemic. He has a history of hypertension and hyperlipidemia on medication. He had right carotid endarterectomy performed by Dr. Tawanna Cooler Early June 2013 followed by duplex ultrasound. He denies chest pain or shortness of breath. His major complaint is of cramping in his thighs which may be related to his statin drug. He was given a statin holiday which really did not impact his symptoms and therefore statin was restarted and follow-up lipid profile performed 07/05/14 revealed a total cholesterol 131, LDL 58 and HDL of 44. Recent carotid Dopplers revealed his endarterectomy site was widely patent with moderate left ICA stenosis and moderate left subclavian artery stenosis without symptoms. He developed symptoms in August of this year and saw Nada Boozer. A Myoview stress test performed 01/09/16 was  notable for ischemia in the RCA distribution. Based on this he underwent cardiac catheterization by Dr. Eldridge Dace 01/16/16 revealing a 90% segmental mid dominant RCA stenosis which was stented with a Synergy 3.5 mm x 20 mm long drug-eluting stent. He did have a very small ramus branch that was 90% stenosed and not suitable for intervention as well as a 70% mid LAD lesion although he had no evidence of anterior ischemia on his stress test. His symptoms of chest pain have resolved. Since I saw him this months ago he is remained stable.  He denies chest pain or shortness of breath.  Does have mild ankle edema.  He is in newly recognized atrial fibrillation today.  He does relate a recent episode of hematuria which lasted for 10 days thought to be related related to his prostate and remote prostate cancer status post radioactive seed implantation.  He did see Dr. Annabell Howells , his urologist for evaluation and stopped his aspirin briefly with resolution of his hematuria.  He has restarted dual anti-platelet therapy.  Since I saw him in  the office a month ago he has had a 2D echo that revealed decline in his LV function from 50 to 55% by his last 2D echo 2 years ago to 30 to 35%.  This may be related to his atrial fibrillation.  He feels weak and fatigued with some shortness of breath as well.  Because of relatively new onset chest pain he saw Theodore Demark in the office he began him on oral nitrate.  His pain improved but he was referred for diagnostic coronary angiography to define his anatomy.   PROCEDURE DESCRIPTION:  The patient was brought to the second floor [place] [facility]iac cath lab in the postabsorptive state. He was premedicated with IV Versed and fentanyl. His right groinwas prepped and shaved in usual sterile fashion. Xylocaine 1% was used for local anesthesia. A 6 French sheath was inserted into the right common femoral artery using standard Seldinger technique.  5 French right and left Judkins diagnostic catheters along with a 5 French pigtail catheter were used for selective coronary angiography and left ventriculography respectively.  Isovue dye was used for the entirety of the case.  Retrograde aorta, left ventricular and pullback pressures were recorded.  The previously placed stent in the mid RCA was widely patent.  The mid LAD with approximately 80% and hypodense.  Based on this, it was decided to proceed with PCI and drug-eluting stenting.  The patient received Angiomax bolus followed by infusion with therapeutic ACT.  He received 600 mg of p.o. Plavix and 20 mg of IV Pepcid.  Isovue dye was used for the entirety of the intervention.  Retrograde aortic pressure was monitored during the case.  Using a 6 [place] XB 3 cm LAD guide catheter along with a 0.14 pro-water guidewire and a 2 mm x 12 mm balloon the mid LAD lesion was predilated.  Following this a 2.25 mm x 16 mm long Synergy drug-eluting stent was then carefully positioned across the diseased segment and deployed at 14 atm.  It was postdilated with a 2.5  mm x 12 mm long noncompliant balloon at 16 atm (2.6 mm resulting reduction of 80% hypodense mid LAD lesion to 0% residual.  The patient tolerated procedure well.  There were no hemodynamic or electrical allograft sequelae.  The guidewire and catheter were removed and the sheath was secured.  Impression Mr. Sgro has a widely patent mid RCA stent with what appears to be  a significant mid LAD lesion.  I performed PCI and drug-eluting stenting with an excellent result.  This may be the culprit lesion responsible for his chest pain.  He does have severe LV dysfunction with an EF in the 20 to 25% range which I suspect is nonischemic.  Angiomax will continue for 4 hours full dose.  The sheath will be removed and pressure held.  Patient will be gently hydrated and discharged home in the morning on dual antiplatelet therapy.  Apixaban will start back tomorrow and he will continue with on aspirin, Plavix and apixaban for 1 month after which the aspirin will be discontinued.  Nanetta Batty. MD, [facility] 12/26/2017 3:54 PM      Recommend to resume Apixaban, at currently prescribed dose and frequency, on 12/28/2017.  Recommend concurrent antiplatelet therapy of Aspirin 81mg  daily for 1 month.and Plavix 75 mg a day after that.  Findings Coronary Findings Diagnostic  Dominance: Right  Left Anterior Descending Prox LAD to Mid LAD lesion is 80% stenosed.  Right Coronary Artery Previously placed Prox RCA to Mid RCA stent (unknown type) is widely patent.  Intervention  Prox LAD to Mid LAD lesion Stent A stent was successfully placed. Post-Intervention Lesion Assessment The intervention was successful. Pre-interventional TIMI flow is 3. Post-intervention TIMI flow is 3. No complications occurred at this lesion. There is a 0% residual stenosis post intervention.   CARDIAC CATHETERIZATION  CARDIAC CATHETERIZATION 01/16/2016  Narrative  Eccentric Mid LAD lesion, 70 %stenosed in some views.  Ramus  lesion, 90 %stenosed. Very small vessel, not suitable for intervention.  Ost 1st Mrg to 1st Mrg lesion, 20 %stenosed.  Mid RCA lesion, 90 %stenosed. A STENT SYNERGY DES 3.5X28 drug eluting stent was successfully placed.  Post intervention, there is a 0% residual stenosis.  LV end diastolic pressure is normal.  Continue dual antiplatelet therapy for at least a year.  We did not address the LAD due to significant pain and bradycardia during the RCA PCI.  This resolved at the end of the procedure.  D/w Dr. Allyson Sabal who will decide on further intervention.  Findings Coronary Findings Diagnostic  Dominance: Right  Left Anterior Descending  Ramus Intermedius Vessel is small.  Left Circumflex  First Obtuse Marginal Branch  Right Coronary Artery The lesion was previously treated using a bare metal stent over 2 years ago.  Intervention  Mid RCA lesion Angioplasty Lesion crossed with guidewire using a WIRE ASAHI PROWATER 180CM. Pre-stent angioplasty was performed using a BALLOON ANGIOSCULPT RX 2.5X10. A STENT SYNERGY DES 3.5X28 drug eluting stent was successfully placed. Stent strut is well apposed. Post-stent angioplasty was performed using a BALLOON [place] TREK RX 4.0X15. There is a 0% residual stenosis post intervention.   STRESS TESTS  MYOCARDIAL PERFUSION IMAGING 01/09/2016  Narrative  The left ventricular ejection fraction is mildly decreased (45-54%).  Nuclear stress EF: 52%.  There was no ST segment deviation noted during stress.  Findings consistent with ischemia.  This is an intermediate risk study.  1. EF 52% with basal inferolateral hypokinesis. 2. Primarily reversible medium sized, moderate-intensity basal inferolateral and basal to mid inferior perfusion defect.  This is concerning for ischemia.   ECHOCARDIOGRAM  ECHOCARDIOGRAM COMPLETE 08/22/2022  Narrative ECHOCARDIOGRAM REPORT    Patient Name:   Dillon Young Date of Exam: 08/22/2022 Medical Rec #:   [id number]        Height:       69.0 in Accession #:    [id number]  Weight:       178.0 lb Date of Birth:  05-08-1933        BSA:          1.966 m Patient Age:    89 years         BP:           134/65 mmHg Patient Gender: M                HR:           59 bpm. Exam Location:  [address]  Procedure: 2D Echo, Cardiac Doppler and Color Doppler  Indications:    I48.00 Atrial Fibrillation  History:        Patient has prior history of Echocardiogram examinations, most recent 11/03/2021. CAD, Carotid Disease and CKD, Arrythmias:Bradycardia; Risk Factors:Hypertension and Diabetes.  Sonographer:    Clearence Ped RCS Referring Phys: [phone] [name]  IMPRESSIONS   1. Left ventricular ejection fraction, by estimation, is 40 to 45%. The left ventricle has mildly decreased function. The left ventricle demonstrates global hypokinesis. There is mild concentric left ventricular hypertrophy. Left ventricular diastolic function could not be evaluated. 2. Right ventricular systolic function is normal. The right ventricular size is normal. There is normal pulmonary artery systolic pressure. The estimated right ventricular systolic pressure is 31.8 mmHg. 3. Left atrial size was mild to moderately dilated. 4. The mitral valve is normal in structure. Trivial mitral valve regurgitation. No evidence of mitral stenosis. 5. The aortic valve is grossly normal. There is mild calcification of the aortic valve. There is mild thickening of the aortic valve. Aortic valve regurgitation is not visualized. Aortic valve sclerosis is present, with no evidence of aortic valve stenosis. 6. The inferior vena cava is normal in size with <50% respiratory variability, suggesting right atrial pressure of 8 mmHg.  Comparison(s): Prior images reviewed side by side. Changes from prior study are noted. EF mildly reduced compared to prior. Patient also in atrial fibrillation on current study.  FINDINGS Left Ventricle: Left  ventricular ejection fraction, by estimation, is 40 to 45%. The left ventricle has mildly decreased function. The left ventricle demonstrates global hypokinesis. The left ventricular internal cavity size was normal in size. There is mild concentric left ventricular hypertrophy. Left ventricular diastolic function could not be evaluated due to atrial fibrillation. Left ventricular diastolic function could not be evaluated.  Right Ventricle: The right ventricular size is normal. No increase in right ventricular wall thickness. Right ventricular systolic function is normal. There is normal pulmonary artery systolic pressure. The tricuspid regurgitant velocity is 2.44 m/s, and with an assumed right atrial pressure of 8 mmHg, the estimated right ventricular systolic pressure is 31.8 mmHg.  Left Atrium: Left atrial size was mild to moderately dilated.  Right Atrium: Right atrial size was normal in size.  Pericardium: There is no evidence of pericardial effusion.  Mitral Valve: The mitral valve is normal in structure. Trivial mitral valve regurgitation. No evidence of mitral valve stenosis.  Tricuspid Valve: The tricuspid valve is normal in structure. Tricuspid valve regurgitation is mild . No evidence of tricuspid stenosis.  Aortic Valve: The aortic valve is grossly normal. There is mild calcification of the aortic valve. There is mild thickening of the aortic valve. Aortic valve regurgitation is not visualized. Aortic valve sclerosis is present, with no evidence of aortic valve stenosis.  Pulmonic Valve: The pulmonic valve was not well visualized. Pulmonic valve regurgitation is trivial. No evidence of pulmonic stenosis.  Aorta:  The aortic root and ascending aorta are structurally normal, with no evidence of dilitation.  Venous: The inferior vena cava is normal in size with less than 50% respiratory variability, suggesting right atrial pressure of 8 mmHg.  IAS/Shunts: The atrial septum is grossly  normal.   LEFT VENTRICLE PLAX 2D LVIDd:         4.80 cm   Diastology LVIDs:         3.90 cm   LV e' medial:    6.64 cm/s LV PW:         1.10 cm   LV E/e' medial:  15.0 LV IVS:        1.10 cm   LV e' lateral:   9.03 cm/s LVOT diam:     2.10 cm   LV E/e' lateral: 11.0 LV SV:         37 LV SV Index:   19 LVOT Area:     3.46 cm   RIGHT VENTRICLE RV Basal diam:  3.50 cm RV S prime:     14.50 cm/s TAPSE (M-mode): 1.9 cm RVSP:           26.8 mmHg  LEFT ATRIUM             Index        RIGHT ATRIUM           Index LA diam:        4.30 cm 2.19 cm/m   RA Pressure: 3.00 mmHg LA Vol (A2C):   62.1 ml 31.58 ml/m  RA Area:     14.40 cm LA Vol (A4C):   64.0 ml 32.55 ml/m  RA Volume:   31.10 ml  15.82 ml/m LA Biplane Vol: 63.4 ml 32.25 ml/m AORTIC VALVE LVOT Vmax:   47.10 cm/s LVOT Vmean:  34.833 cm/s LVOT VTI:    0.108 m  AORTA Ao Root diam: 3.30 cm Ao Asc diam:  3.10 cm  MITRAL VALVE               TRICUSPID VALVE MV Area (PHT):             TR Peak grad:   23.8 mmHg MV Decel Time:             TR Vmax:        244.00 cm/s MV E velocity: 99.45 cm/s  Estimated RAP:  3.00 mmHg RVSP:           26.8 mmHg  SHUNTS Systemic VTI:  0.11 m Systemic Diam: 2.10 cm  Jodelle Red MD Electronically signed by Jodelle Red MD Signature Date/Time: 08/22/2022/4:03:27 PM    Final    MONITORS  LONG TERM MONITOR (3-14 DAYS) 04/19/2020  Narrative 1. Primarily NSR 2. 22% PVCs 3. Few short NSVT runs (up to 12 beats).          Recent Labs: 06/27/2022: ALT 9; TSH 3.81 07/26/2022: Magnesium 2.1 02/25/2023: BUN 25; Creatinine, Ser 1.25; Hemoglobin 14.1; Platelets 166; Potassium 5.0; Sodium 137  Recent Lipid Panel    Component Value Date/Time   CHOL 129 10/18/2021 0925   CHOL 121 10/02/2017 1011   TRIG 118 10/18/2021 0925   HDL 45 10/18/2021 0925   HDL 46 10/02/2017 1011   CHOLHDL 2.9 10/18/2021 0925   VLDL 24 10/18/2021 0925   LDLCALC 60 10/18/2021 0925   LDLCALC 50  10/02/2017 1011    History of Present Illness    89 year old male with the above past medical history including CAD s/p  DES-mRCA in 2017, DES-mLAD in 2019, chronic systolic heart failure, permanent atrial fibrillation, carotid artery stenosis s/p right CEA, hypertension, hyperlipidemia, and bladder cancer.   He has a history of CAD with prior stenting to his RCA in 2017.  Cardiac catheterization in 12/2017 revealed patent RCA stent, 80% proximal to mid LAD stenosis s/p DES, mildly elevated LVEDP, EF less than 25% by visual estimate.  His recent echocardiogram  in 07/2022 showed EF 40 to 45%, mildly decreased LV function, LV global hypokinesis, mild concentric LVH, normal RV, no significant valvular abnormalities.  He has a history of carotid artery stenosis s/p R CEA.  Carotid Dopplers in 09/2022 showed continued patency of R CEA.  Repeat study was recommended in 12 months.  He also has a history of atrial fibrillation previously on Tikosyn.  He underwent DCCV in 06/2022 and was started on amiodarone following Tikosyn washout.  Unfortunately he had early recurrence of atrial fibrillation and underwent repeat DCCV in 08/2022.  He again had early recurrence of atrial fibrillation and ultimately opted for rate control strategy.  He is on Eliquis.  He was evaluated in the ED in September 2024 in the setting of chest pain.  Troponin was negative x 1.  EKG showed rate controlled atrial fibrillation.  He left prior to MD evaluation.  His symptoms improved with Alka-Seltzer.  He was last seen in the a fib clinic on 04/10/2020 was stable from a cardiac standpoint.     He presents today for follow-up.  Since his last visit he has done well from a cardiac standpoint.  He denies symptoms concerning for angina.  He has noticed some intermittently low BP with associated lightheadedness, denies any presyncope or syncope.  He denies palpitations, dyspnea, edema, PND, orthopnea, weight gain.  Overall, he reports feeling  well.  Home Medications    Current Outpatient Medications  Medication Sig Dispense Refill   acetaminophen (TYLENOL) 500 MG tablet Take 500-1,000 mg by mouth daily as needed for mild pain or headache.     apixaban (ELIQUIS) 5 MG TABS tablet TAKE 1 TABLET BY MOUTH TWICE A DAY 180 tablet 1   ascorbic acid (VITAMIN C) 500 MG tablet Take 500 mg by mouth 2 (two) times a week.     carvedilol (COREG) 3.125 MG tablet Take 1 tablet (3.125 mg total) by mouth 2 (two) times daily with a meal. 60 tablet 6   Cholecalciferol (VITAMIN D-3) 25 MCG (1000 UT) CAPS Take 1,000 Units by mouth daily.     dicyclomine (BENTYL) 10 MG capsule TAKE 1 CAPSULE (10 MG TOTAL) BY MOUTH EVERY 6 (SIX) HOURS AS NEEDED (ABDOMINAL PAIN). 30 capsule 1   fexofenadine (ALLEGRA) 180 MG tablet Take 180 mg by mouth daily as needed for allergies or rhinitis.      fluticasone (FLONASE) 50 MCG/ACT nasal spray Place 2 sprays into both nostrils daily. 16 g 6   gabapentin (NEURONTIN) 300 MG capsule Take 1 capsule (300 mg total) by mouth 3 (three) times daily. (Patient taking differently: Take 300 mg by mouth as needed.) 270 capsule 1   hydrALAZINE (APRESOLINE) 25 MG tablet Take 25 mg by mouth 3 (three) times daily as needed (As need when BP is 160).     hydrocortisone (ANUSOL-HC) 2.5 % rectal cream Place 1 application rectally 3 (three) times daily as needed for hemorrhoids. 30 g 1   Hypromellose (ARTIFICIAL TEARS OP) Place 1 drop into both eyes 4 (four) times daily as needed (dry eyes).  ipratropium (ATROVENT) 0.06 % nasal spray Place 2 sprays into both nostrils 2 (two) times daily. 45 mL 1   melatonin 5 MG TABS Take 5 mg by mouth at bedtime as needed (sleep).     nitroGLYCERIN (NITROSTAT) 0.4 MG SL tablet Place 1 tablet (0.4 mg total) under the tongue every 5 (five) minutes as needed for chest pain. 25 tablet 3   rosuvastatin (CRESTOR) 10 MG tablet TAKE 1 TABLET BY MOUTH EVERY DAY 90 tablet 1   sacubitril-valsartan (ENTRESTO) 97-103 MG  Take 1 tablet by mouth 2 (two) times daily. 180 tablet 3   spironolactone (ALDACTONE) 25 MG tablet Take 1 tablet (25 mg total) by mouth daily. (Patient taking differently: Take 12.5 mg by mouth daily.) 90 tablet 3   Tiotropium Bromide-Olodaterol (STIOLTO RESPIMAT) 2.5-2.5 MCG/ACT AERS Inhale 2 puffs into the lungs daily. 4 g 11   triamcinolone cream (KENALOG) 0.1 % Apply 1 application topically daily as needed (to affected areas, for itching).      No current facility-administered medications for this visit.     Review of Systems    He denies chest pain, palpitations, dyspnea, pnd, orthopnea, n, v, dizziness, syncope, edema, weight gain, or early satiety. All other systems reviewed and are otherwise negative except as noted above.  Physical Exam    VS:  BP (!) 108/48 (BP Location: Left Arm, Patient Position: Sitting, Cuff Size: Normal)   Pulse 74   Ht 5\' 8"  (1.727 m)   Wt 175 lb 12.8 oz (79.7 kg)   SpO2 96%   BMI 26.73 kg/m  GEN: Well nourished, well developed, in no acute distress. HEENT: normal. Neck: Supple, no JVD, carotid bruits, or masses. Cardiac: IRIR, no murmurs, rubs, or gallops. No clubbing, cyanosis, edema.  Radials/DP/PT 2+ and equal bilaterally.  Respiratory:  Respirations regular and unlabored, clear to auscultation bilaterally. GI: Soft, nontender, nondistended, BS + x 4. MS: no deformity or atrophy. Skin: warm and dry, no rash. Neuro:  Strength and sensation are intact. Psych: Normal affect.  Accessory Clinical Findings    ECG personally reviewed by me today - EKG Interpretation Date/Time:  Tuesday April 30 2023 09:10:31 EST Ventricular Rate:  64 PR Interval:    QRS Duration:  94 QT Interval:  394 QTC Calculation: 406 R Axis:   -51  Text Interpretation: Atrial fibrillation with premature ventricular or aberrantly conducted complexes and with ventricular escape complexes Left axis deviation Nonspecific T wave abnormality Confirmed by Bernadene Person ([id number])  on 04/30/2023 9:32:56 AM  - no acute changes.   Lab Results  Component Value Date   WBC 6.8 02/25/2023   HGB 14.1 02/25/2023   HCT 43.9 02/25/2023   MCV 88.0 02/25/2023   PLT 166 02/25/2023   Lab Results  Component Value Date   CREATININE 1.25 (H) 02/25/2023   BUN 25 (H) 02/25/2023   NA 137 02/25/2023   K 5.0 02/25/2023   CL 103 02/25/2023   CO2 25 02/25/2023   Lab Results  Component Value Date   ALT 9 06/27/2022   AST 14 06/27/2022   ALKPHOS 49 06/27/2022   BILITOT 0.7 06/27/2022   Lab Results  Component Value Date   CHOL 129 10/18/2021   HDL 45 10/18/2021   LDLCALC 60 10/18/2021   TRIG 118 10/18/2021   CHOLHDL 2.9 10/18/2021    Lab Results  Component Value Date   HGBA1C 6.5 06/27/2022    Assessment & Plan    1. CAD/chest pain: S/p DES-mRCA in 2017, DES-mLAD  in 2019. No ASA in the setting of chronic DOAC therapy.  Stable with no anginal symptoms.  No indication for ischemic evaluation.  Continue carvedilol, Entresto, spironolactone as below, as needed hydralazine, and Crestor.   2. Chronic systolic heart failure: Most recent echocardiogram in 07/2022 showed EF 40 to 45%, mildly decreased LV function, LV global hypokinesis, mild concentric LVH, normal RV, no significant valvular abnormalities. Euvolemic and well compensated on exam.  He has had intermittently low BP.  Will decrease spironolactone to 12.5 mg daily.   Otherwise, continue current medications as above.   3. Permanent atrial fibrillation: Rate controlled.  Followed by the A-fib clinic. Continue carvedilol, Eliquis.   4. Carotid artery stenosis:  S/p right CEA. Carotid Dopplers in 09/2022 showed continued patency of R CEA.  No ASA in the setting of chronic DOAC therapy.  Continue Crestor.   5. Hypertension: BP stable in office today, he has had intermittently low BP with associated lightheadedness.  Will decrease spironolactone as above, otherwise, continue current antihypertensive regimen.  Continue to  monitor BP.   6. Hyperlipidemia: LDL was 60 in 09/2021.  Previously declined repeat fasting lipids, can discuss again at follow-up visit. Continue Crestor.   7. Disposition: Follow-up in 06/2023 with Dr. Allyson Sabal.         Joylene Grapes, NP 04/30/2023, 10:00 AM

## 2023-04-30 NOTE — Patient Instructions (Signed)
Medication Instructions:  Spironolactone 12.5 mg daily  *If you need a refill on your cardiac medications before your next appointment, please call your pharmacy*   Lab Work: NONE ordered at this time of appointment    Testing/Procedures: NONE ordered at this time of appointment     Follow-Up: At [facility], you and your health needs are our priority.  As part of our continuing mission to provide you with exceptional heart care, we have created designated Provider Care Teams.  These Care Teams include your primary Cardiologist (physician) and Advanced Practice Providers (APPs -  Physician Assistants and Nurse Practitioners) who all work together to provide you with the care you need, when you need it.  We recommend signing up for the patient portal called "MyChart".  Sign up information is provided on this After Visit Summary.  MyChart is used to connect with patients for Virtual Visits (Telemedicine).  Patients are able to view lab/test results, encounter notes, upcoming appointments, etc.  Non-urgent messages can be sent to your provider as well.   To learn more about what you can do with MyChart, go to [email or web address].    Your next appointment:    February or March 2025  Provider:   Nanetta Batty, MD

## 2023-05-05 ENCOUNTER — Other Ambulatory Visit: Payer: Self-pay | Admitting: Internal Medicine

## 2023-05-05 DIAGNOSIS — E785 Hyperlipidemia, unspecified: Secondary | ICD-10-CM

## 2023-05-17 MED ORDER — SACUBITRIL-VALSARTAN 97-103 MG PO TABS: 1.0000 | ORAL_TABLET | Freq: Two times a day (BID) | ORAL | 3 refills | Status: DC

## 2023-05-17 MED ORDER — APIXABAN 5 MG PO TABS: 5.0000 mg | ORAL_TABLET | Freq: Two times a day (BID) | ORAL | 3 refills | Status: DC

## 2023-05-17 NOTE — Addendum Note (Signed)
Addended by: Bernita Buffy on: 05/17/2023 04:38 PM   Modules accepted: Orders

## 2023-05-17 NOTE — Telephone Encounter (Signed)
Completed both [employer] and [employer] pt assistance applications and faxed. Confirmation of successful fax received.

## 2023-05-17 NOTE — Addendum Note (Signed)
Addended by: Bernita Buffy on: 05/17/2023 03:41 PM   Modules accepted: Orders

## 2023-05-31 NOTE — Telephone Encounter (Signed)
 Pt notified of approval from [employer] for her entresto to be covered through 05/27/24. Notification fax under media tab. No word yet regarding application sent to [facility]. Pt has no questions and verbalizes understanding.

## 2023-06-10 ENCOUNTER — Other Ambulatory Visit: Payer: Self-pay | Admitting: Cardiovascular Disease

## 2023-06-10 NOTE — Telephone Encounter (Signed)
 Prescription refill request for Eliquis received. Indication:afib Last office visit:12/24 Scr:1.25  9/24 Age: 89 Weight:79.7  kg  Prescription refilled

## 2023-06-23 ENCOUNTER — Other Ambulatory Visit: Payer: Self-pay | Admitting: Internal Medicine

## 2023-06-23 DIAGNOSIS — I519 Heart disease, unspecified: Secondary | ICD-10-CM

## 2023-06-23 DIAGNOSIS — I4819 Other persistent atrial fibrillation: Secondary | ICD-10-CM

## 2023-07-01 ENCOUNTER — Encounter: Payer: Self-pay | Admitting: Internal Medicine

## 2023-07-01 ENCOUNTER — Ambulatory Visit (INDEPENDENT_AMBULATORY_CARE_PROVIDER_SITE_OTHER): Payer: PPO | Admitting: Internal Medicine

## 2023-07-01 VITALS — BP 128/76 | HR 73 | Ht 68.0 in | Wt 177.2 lb

## 2023-07-01 DIAGNOSIS — I4821 Permanent atrial fibrillation: Secondary | ICD-10-CM | POA: Diagnosis not present

## 2023-07-01 DIAGNOSIS — E119 Type 2 diabetes mellitus without complications: Secondary | ICD-10-CM

## 2023-07-01 DIAGNOSIS — E785 Hyperlipidemia, unspecified: Secondary | ICD-10-CM | POA: Diagnosis not present

## 2023-07-01 DIAGNOSIS — N1832 Chronic kidney disease, stage 3b: Secondary | ICD-10-CM | POA: Diagnosis not present

## 2023-07-01 DIAGNOSIS — Z0001 Encounter for general adult medical examination with abnormal findings: Secondary | ICD-10-CM

## 2023-07-01 DIAGNOSIS — D510 Vitamin B12 deficiency anemia due to intrinsic factor deficiency: Secondary | ICD-10-CM | POA: Diagnosis not present

## 2023-07-01 DIAGNOSIS — Z0000 Encounter for general adult medical examination without abnormal findings: Secondary | ICD-10-CM

## 2023-07-01 DIAGNOSIS — D508 Other iron deficiency anemias: Secondary | ICD-10-CM

## 2023-07-01 DIAGNOSIS — I10 Essential (primary) hypertension: Secondary | ICD-10-CM

## 2023-07-01 LAB — URINALYSIS, ROUTINE W REFLEX MICROSCOPIC
Bilirubin Urine: NEGATIVE
Hgb urine dipstick: NEGATIVE
Ketones, ur: NEGATIVE
Leukocytes,Ua: NEGATIVE
Nitrite: NEGATIVE
Specific Gravity, Urine: 1.01 (ref 1.000–1.030)
Urine Glucose: NEGATIVE
Urobilinogen, UA: 0.2 (ref 0.0–1.0)
pH: 6.5 (ref 5.0–8.0)

## 2023-07-01 LAB — MICROALBUMIN / CREATININE URINE RATIO
Creatinine,U: 92.3 mg/dL
Microalb Creat Ratio: 11.7 mg/g (ref 0.0–30.0)
Microalb, Ur: 10.8 mg/dL — ABNORMAL HIGH (ref 0.0–1.9)

## 2023-07-01 LAB — FOLATE: Folate: 25 ng/mL (ref >5.9)

## 2023-07-01 LAB — BASIC METABOLIC PANEL
BUN: 17 mg/dL (ref 6–23)
CO2: 30 meq/L (ref 19–32)
Calcium: 9 mg/dL (ref 8.4–10.5)
Chloride: 104 meq/L (ref 96–112)
Creatinine, Ser: 1.11 mg/dL (ref 0.40–1.50)
GFR: 59.11 mL/min — ABNORMAL LOW (ref >60.00)
Glucose, Bld: 119 mg/dL — ABNORMAL HIGH (ref 70–99)
Potassium: 4.7 meq/L (ref 3.5–5.1)
Sodium: 142 meq/L (ref 135–145)

## 2023-07-01 LAB — CBC WITH DIFFERENTIAL/PLATELET
Basophils Absolute: 0 10*3/uL (ref 0.0–0.1)
Basophils Relative: 0.9 % (ref 0.0–3.0)
Eosinophils Absolute: 0.1 10*3/uL (ref 0.0–0.7)
Eosinophils Relative: 1.2 % (ref 0.0–5.0)
HCT: 44.3 % (ref 39.0–52.0)
Hemoglobin: 14.1 g/dL (ref 13.0–17.0)
Lymphocytes Relative: 22.9 % (ref 12.0–46.0)
Lymphs Abs: 0.9 10*3/uL (ref 0.7–4.0)
MCHC: 31.7 g/dL (ref 30.0–36.0)
MCV: 84.2 fL (ref 78.0–100.0)
Monocytes Absolute: 0.5 10*3/uL (ref 0.1–1.0)
Monocytes Relative: 11.3 % (ref 3.0–12.0)
Neutro Abs: 2.6 10*3/uL (ref 1.4–7.7)
Neutrophils Relative %: 63.7 % (ref 43.0–77.0)
Platelets: 150 10*3/uL (ref 150.0–400.0)
RBC: 5.26 Mil/uL (ref 4.22–5.81)
RDW: 15 % (ref 11.5–15.5)
WBC: 4.1 10*3/uL (ref 4.0–10.5)

## 2023-07-01 LAB — VITAMIN B12: Vitamin B-12: 86 pg/mL — ABNORMAL LOW (ref 211–911)

## 2023-07-01 LAB — HEPATIC FUNCTION PANEL
ALT: 7 U/L (ref 0–53)
AST: 15 U/L (ref 0–37)
Albumin: 4.3 g/dL (ref 3.5–5.2)
Alkaline Phosphatase: 48 U/L (ref 39–117)
Bilirubin, Direct: 0.2 mg/dL (ref 0.0–0.3)
Total Bilirubin: 0.9 mg/dL (ref 0.2–1.2)
Total Protein: 6.8 g/dL (ref 6.0–8.3)

## 2023-07-01 LAB — LIPID PANEL
Cholesterol: 118 mg/dL (ref 0–200)
HDL: 45.8 mg/dL (ref >39.00)
LDL Cholesterol: 51 mg/dL (ref 0–99)
NonHDL: 72.6
Total CHOL/HDL Ratio: 3
Triglycerides: 108 mg/dL (ref 0.0–149.0)
VLDL: 21.6 mg/dL (ref 0.0–40.0)

## 2023-07-01 LAB — HEMOGLOBIN A1C: Hgb A1c MFr Bld: 6.6 % — ABNORMAL HIGH (ref 4.6–6.5)

## 2023-07-01 LAB — TSH: TSH: 3.99 u[IU]/mL (ref 0.35–5.50)

## 2023-07-01 NOTE — Progress Notes (Unsigned)
Subjective:  Patient ID: Dillon Young, male    DOB: July 14, 1932  Age: 89 y.o. MRN: [id number]  CC: Annual Exam, Hyperlipidemia, and Atrial Fibrillation   HPI Dillon Young presents for a CPX and f/up ----  Discussed the use of AI scribe software for clinical note transcription with the patient, who gave verbal consent to proceed.  History of Present Illness   The patient, with atrial fibrillation, presents with irregular heartbeats and elevated heart rate.  He experiences irregular heartbeats, particularly at night, described as 'skips'. He has been diagnosed with atrial fibrillation and has discussed treatment options with a specialist but has not initiated any new treatment. He feels generally well during the day but notes feeling unwell in the mornings. No dizziness, lightheadedness, or sensation of passing out is reported.  He maintains an active lifestyle, walking about four times a week for thirty minutes each session at his church. No chest pain, shortness of breath, or pain when walking.       Outpatient Medications Prior to Visit  Medication Sig Dispense Refill   acetaminophen (TYLENOL) 500 MG tablet Take 500-1,000 mg by mouth daily as needed for mild pain or headache.     ascorbic acid (VITAMIN C) 500 MG tablet Take 500 mg by mouth 2 (two) times a week.     carvedilol (COREG) 3.125 MG tablet TAKE 1 TABLET BY MOUTH TWICE A DAY WITH A MEAL *TAKE WITH 6.25MG  TO EQUAL 9.375MG  TWICE A DAY* 180 tablet 0   Cholecalciferol (VITAMIN D-3) 25 MCG (1000 UT) CAPS Take 1,000 Units by mouth daily.     dicyclomine (BENTYL) 10 MG capsule TAKE 1 CAPSULE (10 MG TOTAL) BY MOUTH EVERY 6 (SIX) HOURS AS NEEDED (ABDOMINAL PAIN). 30 capsule 1   ELIQUIS 5 MG TABS tablet TAKE 1 TABLET BY MOUTH TWICE A DAY 180 tablet 1   fexofenadine (ALLEGRA) 180 MG tablet Take 180 mg by mouth daily as needed for allergies or rhinitis.      fluticasone (FLONASE) 50 MCG/ACT nasal spray Place 2 sprays into both  nostrils daily. 16 g 6   gabapentin (NEURONTIN) 300 MG capsule Take 1 capsule (300 mg total) by mouth 3 (three) times daily. (Patient taking differently: Take 300 mg by mouth as needed.) 270 capsule 1   hydrALAZINE (APRESOLINE) 25 MG tablet Take 25 mg by mouth 3 (three) times daily as needed (As need when BP is 160).     hydrocortisone (ANUSOL-HC) 2.5 % rectal cream Place 1 application rectally 3 (three) times daily as needed for hemorrhoids. 30 g 1   Hypromellose (ARTIFICIAL TEARS OP) Place 1 drop into both eyes 4 (four) times daily as needed (dry eyes).     ipratropium (ATROVENT) 0.06 % nasal spray Place 2 sprays into both nostrils 2 (two) times daily. 45 mL 1   melatonin 5 MG TABS Take 5 mg by mouth at bedtime as needed (sleep).     nitroGLYCERIN (NITROSTAT) 0.4 MG SL tablet Place 1 tablet (0.4 mg total) under the tongue every 5 (five) minutes as needed for chest pain. 25 tablet 3   rosuvastatin (CRESTOR) 10 MG tablet TAKE 1 TABLET BY MOUTH EVERY DAY 90 tablet 0   sacubitril-valsartan (ENTRESTO) 97-103 MG Take 1 tablet by mouth 2 (two) times daily. 180 tablet 3   Tiotropium Bromide-Olodaterol (STIOLTO RESPIMAT) 2.5-2.5 MCG/ACT AERS Inhale 2 puffs into the lungs daily. 4 g 11   triamcinolone cream (KENALOG) 0.1 % Apply 1 application topically daily as needed (  to affected areas, for itching).      spironolactone (ALDACTONE) 25 MG tablet Take 1 tablet (25 mg total) by mouth daily. (Patient taking differently: Take 12.5 mg by mouth daily.) 90 tablet 3   No facility-administered medications prior to visit.    ROS Review of Systems  Constitutional: Negative.  Negative for appetite change, diaphoresis, fatigue and unexpected weight change.  HENT: Negative.    Eyes: Negative.   Respiratory:  Negative for cough, chest tightness, shortness of breath and wheezing.   Cardiovascular:  Negative for chest pain, palpitations and leg swelling.  Gastrointestinal:  Negative for abdominal pain, constipation,  diarrhea, nausea and vomiting.  Endocrine: Negative.   Genitourinary: Negative.  Negative for difficulty urinating.  Musculoskeletal: Negative.  Negative for arthralgias and myalgias.  Skin: Negative.   Neurological:  Negative for dizziness, weakness and light-headedness.  Hematological:  Negative for adenopathy. Does not bruise/bleed easily.  Psychiatric/Behavioral:  Positive for confusion and decreased concentration. Negative for suicidal ideas. The patient is not nervous/anxious.     Objective:  BP 128/76 (BP Location: Left Arm, Patient Position: Sitting, Cuff Size: Normal)   Pulse 73   Ht 5\' 8"  (1.727 m)   Wt 177 lb 3.2 oz (80.4 kg)   SpO2 96%   BMI 26.94 kg/m   BP Readings from Last 3 Encounters:  07/01/23 128/76  04/30/23 (!) 108/48  04/11/23 (!) 164/86    Wt Readings from Last 3 Encounters:  07/01/23 177 lb 3.2 oz (80.4 kg)  04/30/23 175 lb 12.8 oz (79.7 kg)  04/11/23 174 lb 3.2 oz (79 kg)    Physical Exam Vitals reviewed.  Constitutional:      Appearance: Normal appearance.  HENT:     Mouth/Throat:     Mouth: Mucous membranes are moist.  Eyes:     General: No scleral icterus.    Conjunctiva/sclera: Conjunctivae normal.  Cardiovascular:     Rate and Rhythm: Tachycardia present. Rhythm irregularly irregular. Occasional Extrasystoles are present.    Heart sounds: No murmur heard.    No friction rub. No gallop.     Comments: EKG-   A fib with PVC's, 98 bpm LAD NS T wave changes Prolonged EKG Unchanged  Pulmonary:     Effort: Pulmonary effort is normal.     Breath sounds: No stridor. No wheezing, rhonchi or rales.  Abdominal:     General: Abdomen is flat.     Palpations: There is no mass.     Tenderness: There is no abdominal tenderness. There is no guarding.     Hernia: No hernia is present.  Musculoskeletal:        General: Normal range of motion.     Cervical back: Neck supple.     Right lower leg: No edema.     Left lower leg: No edema.   Lymphadenopathy:     Cervical: No cervical adenopathy.  Skin:    General: Skin is warm and dry.  Neurological:     General: No focal deficit present.     Mental Status: He is alert. Mental status is at baseline.  Psychiatric:        Mood and Affect: Mood normal.        Behavior: Behavior normal.     Lab Results  Component Value Date   WBC 4.1 07/01/2023   HGB 14.1 07/01/2023   HCT 44.3 07/01/2023   PLT 150.0 07/01/2023   GLUCOSE 119 (H) 07/01/2023   CHOL 118 07/01/2023   TRIG 108.0  07/01/2023   HDL 45.80 07/01/2023   LDLCALC 51 07/01/2023   ALT 7 07/01/2023   AST 15 07/01/2023   NA 142 07/01/2023   K 4.7 07/01/2023   CL 104 07/01/2023   CREATININE 1.11 07/01/2023   BUN 17 07/01/2023   CO2 30 07/01/2023   TSH 3.99 07/01/2023   PSA 0.015 12/21/2020   INR 1.2 02/10/2022   HGBA1C 6.6 (H) 07/01/2023   MICROALBUR 10.8 (H) 07/01/2023    No results found.  Assessment & Plan:   Hyperlipidemia with target LDL less than 70 - LDL goal achieved. Doing well on the statin  -     Lipid panel; Future -     TSH; Future -     Hepatic function panel; Future  Essential hypertension- BP is well controlled. -     TSH; Future -     Urinalysis, Routine w reflex microscopic; Future  Vitamin B12 deficiency anemia due to intrinsic factor deficiency- Will restart parenteral B12 replacement tx. -     Vitamin B12; Future -     Folate; Future  Iron deficiency anemia secondary to inadequate dietary iron intake -     CBC with Differential/Platelet; Future  Encounter for general adult medical examination with abnormal findings- Exam completed, labs reviewed, vaccines reviewed, no cancer screenings indicated, pt ed material was given.   Stage 3b chronic kidney disease (HCC)- Will avoid nephrotoxic agents  -     Basic metabolic panel; Future -     Urinalysis, Routine w reflex microscopic; Future -     Microalbumin / creatinine urine ratio; Future  Diabetes mellitus without complication  (HCC)- Blood sugar is well controlled. -     Basic metabolic panel; Future -     Urinalysis, Routine w reflex microscopic; Future -     Hemoglobin A1c; Future -     Microalbumin / creatinine urine ratio; Future -     HM Diabetes Foot Exam  Permanent atrial fibrillation (HCC)- Rate is high but he is not willing to take an agent to lower his HR. -     EKG 12-Lead     Follow-up: Return in about 6 months (around 12/29/2023).  [name], MD

## 2023-07-01 NOTE — Progress Notes (Signed)
Ask him to come in soon for a B12 injection

## 2023-07-01 NOTE — Progress Notes (Signed)
Patient scheduled for b12 injection

## 2023-07-01 NOTE — Patient Instructions (Signed)
 Health Maintenance, Male  Adopting a healthy lifestyle and getting preventive care are important in promoting health and wellness. Ask your health care provider about:  The right schedule for you to have regular tests and exams.  Things you can do on your own to prevent diseases and keep yourself healthy.  What should I know about diet, weight, and exercise?  Eat a healthy diet    Eat a diet that includes plenty of vegetables, fruits, low-fat dairy products, and lean protein.  Do not eat a lot of foods that are high in solid fats, added sugars, or sodium.  Maintain a healthy weight  Body mass index (BMI) is a measurement that can be used to identify possible weight problems. It estimates body fat based on height and weight. Your health care provider can help determine your BMI and help you achieve or maintain a healthy weight.  Get regular exercise  Get regular exercise. This is one of the most important things you can do for your health. Most adults should:  Exercise for at least 150 minutes each week. The exercise should increase your heart rate and make you sweat (moderate-intensity exercise).  Do strengthening exercises at least twice a week. This is in addition to the moderate-intensity exercise.  Spend less time sitting. Even light physical activity can be beneficial.  Watch cholesterol and blood lipids  Have your blood tested for lipids and cholesterol at 88 years of age, then have this test every 5 years.  You may need to have your cholesterol levels checked more often if:  Your lipid or cholesterol levels are high.  You are older than 88 years of age.  You are at high risk for heart disease.  What should I know about cancer screening?  Many types of cancers can be detected early and may often be prevented. Depending on your health history and family history, you may need to have cancer screening at various ages. This may include screening for:  Colorectal cancer.  Prostate cancer.  Skin cancer.  Lung  cancer.  What should I know about heart disease, diabetes, and high blood pressure?  Blood pressure and heart disease  High blood pressure causes heart disease and increases the risk of stroke. This is more likely to develop in people who have high blood pressure readings or are overweight.  Talk with your health care provider about your target blood pressure readings.  Have your blood pressure checked:  Every 3-5 years if you are 1-31 years of age.  Every year if you are 28 years old or older.  If you are between the ages of 29 and 4 and are a current or former smoker, ask your health care provider if you should have a one-time screening for abdominal aortic aneurysm (AAA).  Diabetes  Have regular diabetes screenings. This checks your fasting blood sugar level. Have the screening done:  Once every three years after age 40 if you are at a normal weight and have a low risk for diabetes.  More often and at a younger age if you are overweight or have a high risk for diabetes.  What should I know about preventing infection?  Hepatitis B  If you have a higher risk for hepatitis B, you should be screened for this virus. Talk with your health care provider to find out if you are at risk for hepatitis B infection.  Hepatitis C  Blood testing is recommended for:  Everyone born from [birth year] through 1963.  Anyone  with known risk factors for hepatitis C.  Sexually transmitted infections (STIs)  You should be screened each year for STIs, including gonorrhea and chlamydia, if:  You are sexually active and are younger than 88 years of age.  You are older than 88 years of age and your health care provider tells you that you are at risk for this type of infection.  Your sexual activity has changed since you were last screened, and you are at increased risk for chlamydia or gonorrhea. Ask your health care provider if you are at risk.  Ask your health care provider about whether you are at high risk for HIV. Your health care provider  may recommend a prescription medicine to help prevent HIV infection. If you choose to take medicine to prevent HIV, you should first get tested for HIV. You should then be tested every 3 months for as long as you are taking the medicine.  Follow these instructions at home:  Alcohol use  Do not drink alcohol if your health care provider tells you not to drink.  If you drink alcohol:  Limit how much you have to 0-2 drinks a day.  Know how much alcohol is in your drink. In the [place] one drink equals one 12 oz bottle of beer (355 mL), one 5 oz glass of wine (148 mL), or one 1 oz glass of hard liquor (44 mL).  Lifestyle  Do not use any products that contain nicotine or tobacco. These products include cigarettes, chewing tobacco, and vaping devices, such as e-cigarettes. If you need help quitting, ask your health care provider.  Do not use street drugs.  Do not share needles.  Ask your health care provider for help if you need support or information about quitting drugs.  General instructions  Schedule regular health, dental, and eye exams.  Stay current with your vaccines.  Tell your health care provider if:  You often feel depressed.  You have ever been abused or do not feel safe at home.  Summary  Adopting a healthy lifestyle and getting preventive care are important in promoting health and wellness.  Follow your health care provider's instructions about healthy diet, exercising, and getting tested or screened for diseases.  Follow your health care provider's instructions on monitoring your cholesterol and blood pressure.  This information is not intended to replace advice given to you by your health care provider. Make sure you discuss any questions you have with your health care provider.  Document Revised: 10/03/2020 Document Reviewed: 10/03/2020  Elsevier Patient Education  2024 [employer].

## 2023-07-02 ENCOUNTER — Encounter: Payer: Self-pay | Admitting: Internal Medicine

## 2023-07-04 ENCOUNTER — Telehealth: Payer: Self-pay | Admitting: Internal Medicine

## 2023-07-04 ENCOUNTER — Ambulatory Visit: Payer: PPO

## 2023-07-04 DIAGNOSIS — D510 Vitamin B12 deficiency anemia due to intrinsic factor deficiency: Secondary | ICD-10-CM | POA: Diagnosis not present

## 2023-07-04 MED ORDER — CYANOCOBALAMIN 1000 MCG/ML IJ SOLN
1000.0000 ug | Freq: Once | INTRAMUSCULAR | Status: AC
Start: 2023-07-04 — End: 2023-07-04
  Administered 2023-07-04: 1000 ug via INTRAMUSCULAR

## 2023-07-04 NOTE — Telephone Encounter (Signed)
Spoke with patient, gave a verbal understanding °

## 2023-07-04 NOTE — Telephone Encounter (Signed)
Copied from CRM [id number]. Topic: Clinical - Medication Question >> Jul 04, 2023 12:04 PM Dillon Young wrote: Reason for CRM: patient would like to know since he is taking the b12 injections,does he still need to take the b12 pills as well?

## 2023-07-04 NOTE — Progress Notes (Signed)
 Patient visits today for their b-12 injection. Patient informed of what they had received and tolerated injection well. Patient notified to reach out to office if needed.

## 2023-07-05 NOTE — Telephone Encounter (Signed)
 Called [employer] regarding pt assistance application. Spoke with Reena, she states that pt has been denied because he has not yet met the 3% out of pocket spending. She states that 3% would be $1,243.00. She is sending denial letter for documentation purposes.    Called and spoke to pt regarding this. Provided pt with number of $1,243.00. Pt verbalizes understanding.

## 2023-07-08 ENCOUNTER — Encounter: Payer: Self-pay | Admitting: Cardiovascular Disease

## 2023-07-08 ENCOUNTER — Ambulatory Visit: Payer: PPO | Attending: Cardiovascular Disease | Admitting: Cardiovascular Disease

## 2023-07-08 VITALS — BP 146/68 | HR 74 | Ht 68.0 in | Wt 179.2 lb

## 2023-07-08 DIAGNOSIS — I10 Essential (primary) hypertension: Secondary | ICD-10-CM | POA: Diagnosis not present

## 2023-07-08 DIAGNOSIS — I6523 Occlusion and stenosis of bilateral carotid arteries: Secondary | ICD-10-CM | POA: Diagnosis not present

## 2023-07-08 DIAGNOSIS — I4819 Other persistent atrial fibrillation: Secondary | ICD-10-CM | POA: Diagnosis not present

## 2023-07-08 DIAGNOSIS — E785 Hyperlipidemia, unspecified: Secondary | ICD-10-CM | POA: Diagnosis not present

## 2023-07-08 DIAGNOSIS — I519 Heart disease, unspecified: Secondary | ICD-10-CM | POA: Diagnosis not present

## 2023-07-08 DIAGNOSIS — I2510 Atherosclerotic heart disease of native coronary artery without angina pectoris: Secondary | ICD-10-CM | POA: Diagnosis not present

## 2023-07-08 NOTE — Patient Instructions (Addendum)
 Medication Instructions:  Your physician recommends that you continue on your current medications as directed. Please refer to the Current Medication list given to you today.  *If you need a refill on your cardiac medications before your next appointment, please call your pharmacy*   Testing/Procedures: Your physician has requested that you have a carotid duplex. This test is an ultrasound of the carotid arteries in your neck. It looks at blood flow through these arteries that supply the brain with blood. Allow one hour for this exam. There are no restrictions or special instructions. This will take place at [address], [address]. **To do in May**  Please note: We ask at that you not bring children with you during ultrasound (echo/ vascular) testing. Due to room size and safety concerns, children are not allowed in the ultrasound rooms during exams. Our front office staff cannot provide observation of children in our lobby area while testing is being conducted. An adult accompanying a patient to their appointment will only be allowed in the ultrasound room at the discretion of the ultrasound technician under special circumstances. We apologize for any inconvenience.     Follow-Up: At [facility], you and your health needs are our priority.  As part of our continuing mission to provide you with exceptional heart care, we have created designated Provider Care Teams.  These Care Teams include your primary Cardiologist (physician) and Advanced Practice Providers (APPs -  Physician Assistants and Nurse Practitioners) who all work together to provide you with the care you need, when you need it.  We recommend signing up for the patient portal called "MyChart".  Sign up information is provided on this After Visit Summary.  MyChart is used to connect with patients for Virtual Visits (Telemedicine).  Patients are able to view lab/test results, encounter notes, upcoming appointments, etc.   Non-urgent messages can be sent to your provider as well.   To learn more about what you can do with MyChart, go to [email or web address].    Your next appointment:   6 month(s)  Provider:   Marlana Silvan, NP       Then, Lauro Portal, MD will plan to see you again in 12 month(s).    Other Instructions

## 2023-07-08 NOTE — Assessment & Plan Note (Signed)
 History of persistent A-fib status post multiple cardioversions in the past most recently by Dr. Avanell Bob 09/10/2022.  Unfortunately, he has recurrent A-fib and does not wish to be cardioverted again.  He is on Eliquis  oral anticoagulation.

## 2023-07-08 NOTE — Assessment & Plan Note (Signed)
 History of CAD status post angioplasty by myself in 1994 1995 of his circumflex coronary artery.  I last cathed him 12/26/2017 revealing a patent RCA stent with high-grade mid LAD stenosis which I stented.  He has been asymptomatic since.

## 2023-07-08 NOTE — Assessment & Plan Note (Signed)
 History of carotid artery disease status post right carotid endarterectomy by Dr. Shirley Douglas in 2013.  His most recent carotid Doppler studies performed 09/26/2022 revealed a widely patent endarterectomy site.  We will recheck this in May.

## 2023-07-08 NOTE — Assessment & Plan Note (Signed)
 History of hyperlipidemia on low-dose Crestor  with lipid profile performed 07/01/2023 revealing a total cholesterol of 118, LDL 51 and HDL 45.

## 2023-07-08 NOTE — Assessment & Plan Note (Signed)
 History of essential hypertension her blood pressure measured today at 146/68.  He checks his blood pressure at home is usually much lower than this.  I did review his blood pressure log.  He is on carvedilol , hydralazine , Entresto  and spironolactone .

## 2023-07-08 NOTE — Progress Notes (Signed)
 07/08/2023 Dillon Young   10/21/1932  [id number]  Primary Physician Arcadio Knuckles, MD Primary Cardiologist: Dillon Leigh MD Dillon Young, [place]  HPI:  Dillon Young is a 89 y.o.   married Caucasian male father of 2 children, grandfather to 2 grandchildren he was formally a patient of Dr. Rich Champ Rosalee Young. I last  saw him in the office 07/18/2022.  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 He works out 5 days a week and has joined [employer]. He has a history of coronary artery disease status post angioplasty in 1994 and 1995  Of his circumflex coronary artery. He has not required cardiac catheterization since stress Myoview  was performed in 2011 that was low risk and nonischemic. He has a history of hypertension and hyperlipidemia on medication. He had right carotid endarterectomy performed by Dr. Ena Young Early June 2013 followed by duplex ultrasound. He denies chest pain or shortness of breath. His major complaint is of cramping in his thighs which may be related to his statin drug. He was given a statin holiday which really did not impact his symptoms and therefore statin was restarted and follow-up lipid profile performed 07/05/14 revealed a total cholesterol 131, LDL 58 and HDL of 44. Recent carotid Dopplers revealed his endarterectomy site was widely patent with moderate left ICA stenosis and moderate left subclavian artery stenosis without symptoms. He developed symptoms in August of this year and saw Dillon Young. A Myoview  stress test performed 01/09/16 was notable for ischemia in the RCA distribution. Based on this he underwent cardiac catheterization by Dr. Jacquelynn Young 01/16/16 revealing a 90% segmental mid dominant RCA stenosis which was stented with a Synergy 3.5 mm x 20 mm long drug-eluting stent. He did have a very small ramus branch that was 90% stenosed and not suitable for intervention as well as a 70% mid LAD  lesion although he had no evidence of anterior ischemia on his stress test. His symptoms of chest pain have resolved.    Since I saw him this months ago he is remained stable.  He denies chest pain or shortness of breath.  Does have mild ankle edema.  He is in newly recognized atrial fibrillation today.  He does relate a recent episode of hematuria which lasted for 10 days thought to be related related to his prostate and remote prostate cancer status post radioactive seed implantation.  He did see Dr. Inga Young , his urologist for evaluation and stopped his aspirin  briefly with resolution of his hematuria.  He has restarted dual anti-platelet therapy.   He  had a 2D echo that revealed decline in his LV function from 50 to 55% by his last 2D echo 2 years ago to 30 to 35%.  This may be related to his atrial fibrillation.  He feels weak and fatigued with some shortness of breath as well.   Because of relatively new onset chest pain he saw Dillon Young in the office he began him on oral nitrate.  His pain improved but he was referred for diagnostic coronary angiography to define his anatomy.   I performed cardiac catheterization on him 12/26/2017 revealing a patent RCA stent with high-grade mid LAD lesion which underwent PCI and drug-eluting stenting.  He was also found at that time by a left cheek log to have an EF of 25 to 30%.  He was in A. fib.  He subsequently undergone  pharmacologic cardioversion with Tikosyn .  He is on optimal medical therapy for his LV dysfunction and has seen Dr. Peder Young in the advanced heart failure clinic as recently as 12/01/2018.   His ejection fraction increased to normal after cardioversion.  He was somewhat weak and bradycardic, and his carvedilol  was discontinued by Dr. Mitzie Young recently resulting in improvement in his symptoms.  He denies chest pain or shortness of breath.  He was in sinus rhythm when he saw Dr. Mitzie Young 12/03/2018 and again 11/01/2019.   He and his wife walk 3-4  times a week.  His most recent 2D echo performed l 05/16/2020 revealed preserved EF in the 50 to 55% range.  Event monitor showed 22% PVCs but no A. fib.  Carotid Dopplers performed 10/15/2019 did show moderate left ICA stenosis although his most recent carotid Doppler performed 10/02/2021 did not show ICA stenosis.  Since I saw him a year ago he continues to do well.    He still walks 3 miles a day 4 days a week with his wife Dillon Young who is 32 years old.  He denies chest pain or shortness of breath.  He had cardioversion by Dr. Avanell Bob 09/10/22 but has since gone back into A-fib.   Current Meds  Medication Sig   acetaminophen  (TYLENOL ) 500 MG tablet Take 500-1,000 mg by mouth daily as needed for mild pain or headache.   ascorbic acid  (VITAMIN C ) 500 MG tablet Take 500 mg by mouth 2 (two) times a week.   carvedilol  (COREG ) 3.125 MG tablet TAKE 1 TABLET BY MOUTH TWICE A DAY WITH A MEAL *TAKE WITH 6.25MG  TO EQUAL 9.375MG  TWICE A DAY*   Cholecalciferol  (VITAMIN D -3) 25 MCG (1000 UT) CAPS Take 1,000 Units by mouth daily.   dicyclomine  (BENTYL ) 10 MG capsule TAKE 1 CAPSULE (10 MG TOTAL) BY MOUTH EVERY 6 (SIX) HOURS AS NEEDED (ABDOMINAL PAIN).   ELIQUIS  5 MG TABS tablet TAKE 1 TABLET BY MOUTH TWICE A DAY   fexofenadine (ALLEGRA) 180 MG tablet Take 180 mg by mouth daily as needed for allergies or rhinitis.    fluticasone  (FLONASE ) 50 MCG/ACT nasal spray Place 2 sprays into both nostrils daily.   gabapentin  (NEURONTIN ) 300 MG capsule Take 1 capsule (300 mg total) by mouth 3 (three) times daily. (Patient taking differently: Take 300 mg by mouth as needed.)   hydrALAZINE  (APRESOLINE ) 25 MG tablet Take 25 mg by mouth 3 (three) times daily as needed (As need when BP is 160).   hydrocortisone  (ANUSOL -HC) 2.5 % rectal cream Place 1 application rectally 3 (three) times daily as needed for hemorrhoids.   Hypromellose (ARTIFICIAL TEARS OP) Place 1 drop into both eyes 4 (four) times daily as needed (dry eyes).   ipratropium  (ATROVENT ) 0.06 % nasal spray Place 2 sprays into both nostrils 2 (two) times daily.   melatonin 5 MG TABS Take 5 mg by mouth at bedtime as needed (sleep).   nitroGLYCERIN  (NITROSTAT ) 0.4 MG SL tablet Place 1 tablet (0.4 mg total) under the tongue every 5 (five) minutes as needed for chest pain.   rosuvastatin  (CRESTOR ) 10 MG tablet TAKE 1 TABLET BY MOUTH EVERY DAY   sacubitril -valsartan  (ENTRESTO ) 97-103 MG Take 1 tablet by mouth 2 (two) times daily.   Tiotropium Bromide-Olodaterol (STIOLTO RESPIMAT ) 2.5-2.5 MCG/ACT AERS Inhale 2 puffs into the lungs daily.   triamcinolone cream (KENALOG) 0.1 % Apply 1 application topically daily as needed (to affected areas, for itching).      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) [employer]   Occupation: Retired  Tobacco Use   Smoking status: Former    Current packs/day: 0.00    Average packs/day: 1.5 packs/day for 60.0 years (90.0 ttl pk-yrs)    Types: Cigarettes    Start date: 07/25/1948    Quit date: 07/25/2008    Years since quitting: 14.9   Smokeless tobacco: Never  Vaping Use   Vaping status: Never Used  Substance and Sexual Activity   Alcohol  use: Not Currently   Drug use: No   Sexual activity: Not Currently  Other Topics Concern   Not on file  Social History Narrative   Married second time   Retired [employer] n39 yrs   2 daughters and 2 stepdaughters   Social Drivers of Corporate investment banker Strain: Low Risk  (10/19/2022)   Overall Financial Resource Strain (CARDIA)    Difficulty of Paying Living Expenses: Not hard at all  Food Insecurity: No  Food Insecurity (10/19/2022)   Hunger Vital Sign    Worried About Running Out of Food in the Last Year: Never true    Ran Out of Food in the Last Year: Never true  Transportation Needs: No Transportation Needs (10/19/2022)   PRAPARE - Administrator, Civil Service (Medical): No    Lack of Transportation (Non-Medical): No  Physical Activity: Insufficiently Active (10/19/2022)   Exercise Vital Sign    Days of Exercise per Week: 4 days    Minutes of Exercise per Session: 30 min  Stress: No Stress Concern Present (10/19/2022)   [employer] of Occupational Health - Occupational Stress Questionnaire    Feeling of Stress : Only a little  Social Connections: Moderately Isolated (10/19/2022)   Social Connection and Isolation Panel [NHANES]    Frequency of Communication with Friends and Family: More than three times a week    Frequency of Social Gatherings with Friends and Family: More than three times a week    Attends Religious Services: Never    Database administrator or Organizations: No    Attends Banker Meetings: Never    Marital Status: Married  Catering manager Violence: Not At Risk (10/19/2022)   Humiliation, Afraid, Rape, and Kick questionnaire    Fear of Current or Ex-Partner: No    Emotionally Abused: No    Physically Abused: No    Sexually Abused: No     Review of Systems: General: negative for chills, fever, night sweats or weight changes.  Cardiovascular: negative for chest pain, dyspnea on exertion, edema, orthopnea, palpitations, paroxysmal nocturnal dyspnea or shortness of breath Dermatological: negative for rash Respiratory: negative for cough or wheezing Urologic: negative for hematuria Abdominal: negative for nausea, vomiting, diarrhea, bright red blood per rectum, melena, or hematemesis Neurologic: negative for visual changes, syncope, or dizziness All other systems reviewed and are otherwise negative except as noted above.    Blood  pressure (!) 146/68, pulse 74, height 5\' 8"  (1.727 m), weight 179 lb 3.2 oz (81.3 kg), SpO2 99%.  General appearance: alert  and no distress Neck: no adenopathy, no carotid bruit, no JVD, supple, symmetrical, trachea midline, and thyroid  not enlarged, symmetric, no tenderness/mass/nodules Lungs: clear to auscultation bilaterally Heart: irregularly irregular rhythm Extremities: extremities normal, atraumatic, no cyanosis or edema Pulses: 2+ and symmetric Skin: Skin color, texture, turgor normal. No rashes or lesions Neurologic: Grossly normal  EKG EKG Interpretation Date/Time:  Monday July 08 2023 12:00:09 EST Ventricular Rate:  74 PR Interval:    QRS Duration:  96 QT Interval:  368 QTC Calculation: 408 R Axis:   -34  Text Interpretation: Atrial fibrillation with premature ventricular or aberrantly conducted complexes Left axis deviation Low voltage QRS When compared with ECG of 30-Apr-2023 09:10, Sinus rhythm is no longer with ventricular escape complexes Confirmed by Lauro Portal [phone]) on 07/08/2023 12:14:54 PM    ASSESSMENT AND PLAN:   Hyperlipidemia with target LDL less than 70 History of hyperlipidemia on low-dose Crestor  with lipid profile performed 07/01/2023 revealing a total cholesterol of 118, LDL 51 and HDL 45.  Essential hypertension History of essential hypertension her blood pressure measured today at 146/68.  He checks his blood pressure at home is usually much lower than this.  I did review his blood pressure log.  He is on carvedilol , hydralazine , Entresto  and spironolactone .  Carotid artery disease- s/p RCE June 2013 History of carotid artery disease status post right carotid endarterectomy by Dr. Shirley Douglas in 2013.  His most recent carotid Doppler studies performed 09/26/2022 revealed a widely patent endarterectomy site.  We will recheck this in May.  Persistent atrial fibrillation History of persistent A-fib status post multiple cardioversions in the past most  recently by Dr. Avanell Bob 09/10/2022.  Unfortunately, he has recurrent A-fib and does not wish to be cardioverted again.  He is on Eliquis  oral anticoagulation.  Left ventricular dysfunction History of nonischemic cardiomyopathy with recent echo performed 08/22/2022 revealing EF of 40 to 45%.  It has been higher in the past.  He is on GDMT.  He is asymptomatic.  CAD (coronary artery disease) History of CAD status post angioplasty by myself in 1994 1995 of his circumflex coronary artery.  I last cathed him 12/26/2017 revealing a patent RCA stent with high-grade mid LAD stenosis which I stented.  He has been asymptomatic since.     Dillon Leigh MD FACP,FACC,FAHA, [facility] 07/08/2023 12:26 PM

## 2023-07-08 NOTE — Assessment & Plan Note (Signed)
 History of nonischemic cardiomyopathy with recent echo performed 08/22/2022 revealing EF of 40 to 45%.  It has been higher in the past.  He is on GDMT.  He is asymptomatic.

## 2023-07-11 ENCOUNTER — Ambulatory Visit: Payer: PPO

## 2023-07-11 DIAGNOSIS — D510 Vitamin B12 deficiency anemia due to intrinsic factor deficiency: Secondary | ICD-10-CM

## 2023-07-11 MED ORDER — CYANOCOBALAMIN 1000 MCG/ML IJ SOLN
1000.0000 ug | Freq: Once | INTRAMUSCULAR | Status: AC
Start: 2023-07-11 — End: 2023-07-11
  Administered 2023-07-11: 1000 ug via INTRAMUSCULAR

## 2023-07-11 NOTE — Progress Notes (Signed)
Patient presents in office today for b12 injection. Tolerated injection well, will schedule last weekly shot and schedule for monthly moving forward.

## 2023-07-16 ENCOUNTER — Ambulatory Visit (INDEPENDENT_AMBULATORY_CARE_PROVIDER_SITE_OTHER): Payer: PPO

## 2023-07-16 DIAGNOSIS — D510 Vitamin B12 deficiency anemia due to intrinsic factor deficiency: Secondary | ICD-10-CM | POA: Diagnosis not present

## 2023-07-16 MED ORDER — CYANOCOBALAMIN 1000 MCG/ML IJ SOLN
1000.0000 ug | Freq: Once | INTRAMUSCULAR | Status: AC
Start: 2023-07-16 — End: 2023-07-16
  Administered 2023-07-16: 1000 ug via INTRAMUSCULAR

## 2023-07-16 NOTE — Progress Notes (Signed)
 Patient visits today for their b-12 injection. Patient informed of what they had received and tolerated injection well. Patient notified to reach out to office if needed.

## 2023-07-18 ENCOUNTER — Ambulatory Visit: Payer: PPO

## 2023-07-30 ENCOUNTER — Telehealth (HOSPITAL_COMMUNITY): Payer: Self-pay

## 2023-07-30 NOTE — Telephone Encounter (Signed)
 Called patient to confirm appointment scheduled tomorrow here in the AHF clinic. Patient aware of appointment time, date, and location. Patient verified they will be at appointment.

## 2023-07-30 NOTE — Progress Notes (Incomplete)
 ADVANCED HF CLINIC NOTE  Primary Care: Etta Grandchild, MD Primary Cardiologist: Nanetta Batty, MD [facility] Cardiology: Dr. Shirlee Latch  Chief Complaint: Heart Failure Follow up HPI: GENERAL WEARING is a 89 y.o. male who has a h/o significant CAD (PCI in '94, '95, '17, and '19) chronic systolic CHF, persistent atrial fibrillation, carotid stenosis s/p RCA endarterectomy in '13, and bladder cancer.   LHC/PCI in 8/17 with DES to RCA, small ramus branch was 90% stenosed and not suitable for intervention as well as a 70% mid LAD lesion although he had no evidence of anterior ischemia on his stress test.     Diagnosed in 5/19 with new onset atrial fibrillation. Echo was done with decreased EF to 30-35%. He had a DCCV in 5/19, unfortunately, he only held NSR for a few days and was been back in atrial fibrillation soon after that.  He had recurrent chest pain and was again cathed in 8/19, this time showing 80% proximal/mid LAD that was treated with DES. In 10/19, he was admitted for Tikosyn load and was cardioverted back to NSR.    Echo in 1/20 showed  EF up to 55-60% with mild LVH.    11/21 Zio patch showed 22% PVCs with short NSVT runs. Echo in 12/21, showing EF 50-55%, mild LVH, mild MR, normal RV.    Echo 6/23 showed EF 55-60%, grade I DD, mild LVH, normal RV  Last seen in AHF Clinic 10/18/21. Overall doing well. Weight 172-175 lbs.  Has been followed closely in Afib Clinic for frequent paroxysmal afib. Underwent Tikosyn wash and conversion to amio in 3/24. S/p DCCV in 4/24. Last seen 11/24, was in rate controlled afib.  Today he returns for HF follow up. Overall feeling fine. Walks 4x/week outside with his wife, occasionally works out at [employer]. Has occasional SOB with dyspnea, although it is not frequent. Has dizziness and lightheadedness that makes him feel unsteady in the mornings. SBP before medication 100-120s. Reports small amount of edema in lower extremeties that improved with elevation, on  exam appears to be related to venous disease, we discussed compression socks. Denies palpitations, abnormal bleeding, CP, or PND/Orthopnea. Appetite ok. No fever or chills. Taking all medications.    Past Medical History:  Diagnosis Date   Adenomatous colon polyp    Allergic rhinitis    Anemia    Anxiety    Bladder cancer (HCC)    currently on bcg treatments   BPH (benign prostatic hyperplasia)    CAD (coronary artery disease)    sees Dr. Nanetta Batty    Carotid artery disease [facility])    CHF (congestive heart failure) (HCC)    Chronic kidney disease    Sones. Prostate cancer   Clotting disorder (HCC)    CVA (cerebral infarction)    Diabetes mellitus    "Borderline"   Diverticulitis    Dysrhythmia    atrial fib - hx of    Edema    recent swelling in feet and ankles    GERD (gastroesophageal reflux disease)    history   Headache    Hemorrhoids    History of kidney stones    HTN (hypertension)    Hx of radiation therapy    for prostate - 2013    Hyperlipidemia LDL goal <70    IBS (irritable bowel syndrome)    Low back pain    Pre-diabetes    Prostate cancer (HCC) 2008   seed implantation   Sleep apnea  sitting in recliner helps him to breathe per pt , never used cpap sleep study - pt reports could not go to sleep- 6 years ago    Smoker    Stroke [facility])    hx of mini strokes years ago     Current Outpatient Medications  Medication Sig Dispense Refill   acetaminophen (TYLENOL) 500 MG tablet Take 500-1,000 mg by mouth daily as needed for mild pain or headache.     ascorbic acid (VITAMIN C) 500 MG tablet Take 500 mg by mouth 2 (two) times a week.     carvedilol (COREG) 3.125 MG tablet TAKE 1 TABLET BY MOUTH TWICE A DAY WITH A MEAL *TAKE WITH 6.25MG  TO EQUAL 9.375MG  TWICE A DAY* 180 tablet 0   Cholecalciferol (VITAMIN D-3) 25 MCG (1000 UT) CAPS Take 1,000 Units by mouth daily.     dicyclomine (BENTYL) 10 MG capsule TAKE 1 CAPSULE (10 MG TOTAL) BY MOUTH EVERY 6 (SIX)  HOURS AS NEEDED (ABDOMINAL PAIN). 30 capsule 1   ELIQUIS 5 MG TABS tablet TAKE 1 TABLET BY MOUTH TWICE A DAY 180 tablet 1   fexofenadine (ALLEGRA) 180 MG tablet Take 180 mg by mouth daily as needed for allergies or rhinitis.      fluticasone (FLONASE) 50 MCG/ACT nasal spray Place 2 sprays into both nostrils daily. 16 g 6   gabapentin (NEURONTIN) 300 MG capsule Take 1 capsule (300 mg total) by mouth 3 (three) times daily. (Patient taking differently: Take 300 mg by mouth as needed.) 270 capsule 1   hydrocortisone (ANUSOL-HC) 2.5 % rectal cream Place 1 application rectally 3 (three) times daily as needed for hemorrhoids. 30 g 1   Hypromellose (ARTIFICIAL TEARS OP) Place 1 drop into both eyes 4 (four) times daily as needed (dry eyes).     ipratropium (ATROVENT) 0.06 % nasal spray Place 2 sprays into both nostrils 2 (two) times daily. 45 mL 1   melatonin 5 MG TABS Take 5 mg by mouth at bedtime as needed (sleep).     nitroGLYCERIN (NITROSTAT) 0.4 MG SL tablet Place 1 tablet (0.4 mg total) under the tongue every 5 (five) minutes as needed for chest pain. 25 tablet 3   rosuvastatin (CRESTOR) 10 MG tablet TAKE 1 TABLET BY MOUTH EVERY DAY 90 tablet 0   sacubitril-valsartan (ENTRESTO) 49-51 MG Take 1 tablet by mouth 2 (two) times daily. 60 tablet 6   Tiotropium Bromide-Olodaterol (STIOLTO RESPIMAT) 2.5-2.5 MCG/ACT AERS Inhale 2 puffs into the lungs daily. 4 g 11   triamcinolone cream (KENALOG) 0.1 % Apply 1 application topically daily as needed (to affected areas, for itching).      spironolactone (ALDACTONE) 25 MG tablet Take 1 tablet (25 mg total) by mouth daily. (Patient taking differently: Take 12.5 mg by mouth daily.) 90 tablet 3   No current facility-administered medications for this encounter.    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) [employer]   Occupation: Retired  Tobacco Use   Smoking status: Former    Current packs/day: 0.00  Average packs/day: 1.5 packs/day for 60.0 years (90.0 ttl pk-yrs)    Types: Cigarettes    Start date: 07/25/1948    Quit date: 07/25/2008    Years since quitting: 15.0   Smokeless tobacco: Never  Vaping Use   Vaping status: Never Used  Substance and Sexual Activity   Alcohol use: Not Currently   Drug use: No   Sexual activity: Not Currently  Other Topics Concern   Not on file  Social History Narrative   Married second time   Retired [employer] n39 yrs   2 daughters and 2 stepdaughters   Social Drivers of Corporate investment banker Strain: Low Risk  (10/19/2022)   Overall Financial Resource Strain (CARDIA)    Difficulty of Paying Living Expenses: Not hard at all  Food Insecurity: No Food Insecurity (10/19/2022)   Hunger Vital Sign    Worried About Running Out of Food in the Last Year: Never true    Ran Out of Food in the Last Year: Never true  Transportation Needs: No Transportation Needs (10/19/2022)   PRAPARE - Administrator, Civil Service (Medical): No    Lack of Transportation (Non-Medical): No  Physical Activity: Insufficiently Active (10/19/2022)   Exercise Vital Sign    Days of Exercise per Week: 4 days    Minutes of Exercise per Session: 30 min  Stress: No Stress Concern Present (10/19/2022)   [employer] of Occupational Health - Occupational Stress Questionnaire    Feeling of Stress : Only a little  Social Connections: Moderately Isolated (10/19/2022)   Social Connection and Isolation Panel [NHANES]    Frequency of Communication with Friends and Family: More than three times a week    Frequency of Social  Gatherings with Friends and Family: More than three times a week    Attends Religious Services: Never    Database administrator or Organizations: No    Attends Banker Meetings: Never    Marital Status: Married  Catering manager Violence: Not At Risk (10/19/2022)   Humiliation, Afraid, Rape, and Kick questionnaire    Fear of Current or Ex-Partner: No    Emotionally Abused: No    Physically Abused: No    Sexually Abused: No      Family History  Problem Relation Age of Onset   Heart disease Father    Hypertension Mother    Asthma Paternal Grandmother    Colon cancer Neg Hx    Esophageal cancer Neg Hx    Stomach cancer Neg Hx    Rectal cancer Neg Hx    Liver cancer Neg Hx     Vitals:   07/31/23 0956  BP: 138/70  Pulse: 65  SpO2: 97%  Weight: 81.2 kg (179 lb)  Height: 5\' 8"  (1.727 m)    Filed Weights   07/31/23 0956  Weight: 81.2 kg (179 lb)    PHYSICAL EXAM: General: Elderly appearing. No distress on RA. Walked into clin Cardiac: JVP not elevated. S1 and S2 present. No murmurs or rub. Extremities: Warm and dry. No rash, cyanosis.  1+ edema.  Neuro: Alert and oriented x3. Affect pleasant. Moves all extremities without difficulty.  ECG: Afib 67 bpm with 1 PVC (personally reviewed)  ASSESSMENT & PLAN: 1. Chronic systolic CHF: Suspect mixed ischemic/nonischemic cardiomyopathy.  Echo in 5/19 with EF 30-35% and severe LV dilation.  CAD likely plays role, but fall in EF was noted around the time he went into atrial fibrillation.  No RVR, but can see fall in EF with afib even in the absence of RVR. In 1/20 in NSR on Tikosyn, and echo showed EF up to 55-60%. Echo 6/23 EF 55-60%. Echo 3/24 EF 40-45%, back in AF. - NYHA I-II. Euvolemic on exam. - Continue Coreg 3.125  mg bid  - Decrease Entresto to 49/51 mg bid with orthostasis/dizziness - Only taking spironolactone 12.5 mg daily. Decreased by PCP for hypotension.  - BMET today.   2. CAD: Most recently, DES to  proximal/mid LAD on 12/26/17.  He was on Plavix for > 6 months then stopped it with gross hematuria.  - no s/s angina - Continue apixaban 5 mg bid. No bleeding. No falls. Creatinine and weight ok. - No ASA with apixaban use.  - Continue Crestor.   3. Atrial fibrillation: Persistent. DCCV in 5/19 only held a few days. Failed Tikosyn, placed back on Amio 3/24 and DCCV 4/24. Back in rate controlled AF. Declines further treatment. - On ECG today in rate controlled AF - Continue beta blocker - Continue Eliquis 5 mg bid.   4. HTN: BP stable. Orthostatic at home. Decrease doses as above.  5. PVCs: 22% PVCs with NSVT runs on Zio patch in 11/21 despite Tikosyn and Coreg use. Now off Tikosyn. - asymptomatic. PVC noted on ECG today - continue coreg, would not increase with dizziness  Follow up in 6 months with Dr. Shirlee Latch. Repeat Echo in 2 months.   [place] Welma Mccombs, NP 07/31/23

## 2023-07-31 ENCOUNTER — Encounter (HOSPITAL_COMMUNITY): Payer: Self-pay

## 2023-07-31 ENCOUNTER — Ambulatory Visit (HOSPITAL_COMMUNITY)
Admission: RE | Admit: 2023-07-31 | Discharge: 2023-07-31 | Disposition: A | Discharge status: Home / Self Care | Source: Ambulatory Visit | Attending: Adult Health | Admitting: Adult Health

## 2023-07-31 VITALS — BP 138/70 | HR 65 | Ht 68.0 in | Wt 179.0 lb

## 2023-07-31 DIAGNOSIS — I5022 Chronic systolic (congestive) heart failure: Secondary | ICD-10-CM | POA: Insufficient documentation

## 2023-07-31 DIAGNOSIS — I2510 Atherosclerotic heart disease of native coronary artery without angina pectoris: Secondary | ICD-10-CM | POA: Insufficient documentation

## 2023-07-31 DIAGNOSIS — Z87891 Personal history of nicotine dependence: Secondary | ICD-10-CM | POA: Insufficient documentation

## 2023-07-31 DIAGNOSIS — I429 Cardiomyopathy, unspecified: Secondary | ICD-10-CM | POA: Diagnosis not present

## 2023-07-31 DIAGNOSIS — I130 Hypertensive heart and chronic kidney disease with heart failure and stage 1 through stage 4 chronic kidney disease, or unspecified chronic kidney disease: Secondary | ICD-10-CM | POA: Insufficient documentation

## 2023-07-31 DIAGNOSIS — Z955 Presence of coronary angioplasty implant and graft: Secondary | ICD-10-CM | POA: Insufficient documentation

## 2023-07-31 DIAGNOSIS — I10 Essential (primary) hypertension: Secondary | ICD-10-CM | POA: Diagnosis not present

## 2023-07-31 DIAGNOSIS — Z8551 Personal history of malignant neoplasm of bladder: Secondary | ICD-10-CM | POA: Insufficient documentation

## 2023-07-31 DIAGNOSIS — Z7901 Long term (current) use of anticoagulants: Secondary | ICD-10-CM | POA: Diagnosis not present

## 2023-07-31 DIAGNOSIS — I4819 Other persistent atrial fibrillation: Secondary | ICD-10-CM | POA: Diagnosis not present

## 2023-07-31 DIAGNOSIS — Z79899 Other long term (current) drug therapy: Secondary | ICD-10-CM | POA: Diagnosis not present

## 2023-07-31 DIAGNOSIS — I493 Ventricular premature depolarization: Secondary | ICD-10-CM | POA: Diagnosis not present

## 2023-07-31 DIAGNOSIS — N3041 Irradiation cystitis with hematuria: Secondary | ICD-10-CM | POA: Diagnosis not present

## 2023-07-31 DIAGNOSIS — R8289 Other abnormal findings on cytological and histological examination of urine: Secondary | ICD-10-CM | POA: Diagnosis not present

## 2023-07-31 LAB — BASIC METABOLIC PANEL
Anion gap: 6 (ref 5–15)
BUN: 20 mg/dL (ref 8–23)
CO2: 29 mmol/L (ref 22–32)
Calcium: 9.3 mg/dL (ref 8.9–10.3)
Chloride: 107 mmol/L (ref 98–111)
Creatinine, Ser: 1.14 mg/dL (ref 0.61–1.24)
GFR, Estimated: >60 mL/min (ref >60)
Glucose, Bld: 115 mg/dL — ABNORMAL HIGH (ref 70–99)
Potassium: 4.6 mmol/L (ref 3.5–5.1)
Sodium: 142 mmol/L (ref 135–145)

## 2023-07-31 MED ORDER — SACUBITRIL-VALSARTAN 49-51 MG PO TABS: 1.0000 | ORAL_TABLET | Freq: Two times a day (BID) | ORAL | 6 refills | Status: DC

## 2023-07-31 NOTE — Patient Instructions (Signed)
 Medication Changes:  DECREASE Entresto 49-51mg  (1 tab) two times daily  Lab Work:  Labs done today, your results will be available in MyChart, we will contact you for abnormal readings.   Testing/Procedures:  Your physician has requested that you have an echocardiogram. Echocardiography is a painless test that uses sound waves to create images of your heart. It provides your doctor with information about the size and shape of your heart and how well your heart's chambers and valves are working. This procedure takes approximately one hour. There are no restrictions for this procedure. Please do NOT wear cologne, perfume, aftershave, or lotions (deodorant is allowed). Please arrive 15 minutes prior to your appointment time.  Please note: We ask at that you not bring children with you during ultrasound (echo/ vascular) testing. Due to room size and safety concerns, children are not allowed in the ultrasound rooms during exams. Our front office staff cannot provide observation of children in our lobby area while testing is being conducted. An adult accompanying a patient to their appointment will only be allowed in the ultrasound room at the discretion of the ultrasound technician under special circumstances. We apologize for any inconvenience.    Follow-Up in: Please follow up with the [facility] in 6 months with Dr. Shirlee Latch. We currently do not have that schedule. Please call us in May in order to schedule your appointment for July.  At the [facility], you and your health needs are our priority. We have a designated team specialized in the treatment of Heart Failure. This Care Team includes your primary Heart Failure Specialized Cardiologist (physician), Advanced Practice Providers (APPs- Physician Assistants and Nurse Practitioners), and Pharmacist who all work together to provide you with the care you need, when you need it.   You may see any of the  following providers on your designated Care Team at your next follow up:  Dr. Arvilla Meres Dr. Marca Ancona Dr. Dorthula Nettles Dr. Theresia Bough Tonye Becket, NP Robbie Lis, [name] [facility] [place], [name] Brynda Peon, NP [place] Lee, NP Karle Plumber, PharmD   Please be sure to bring in all your medications bottles to every appointment.   Need to Contact us:  If you have any questions or concerns before your next appointment please send [place] a message through [place] or call our office at [phone].    TO LEAVE A MESSAGE FOR THE NURSE SELECT OPTION 2, PLEASE LEAVE A MESSAGE INCLUDING: YOUR NAME DATE OF BIRTH CALL BACK NUMBER REASON FOR CALL**this is important as we prioritize the call backs  YOU WILL RECEIVE A CALL BACK THE SAME DAY AS LONG AS YOU CALL BEFORE 4:00 PM

## 2023-08-04 ENCOUNTER — Other Ambulatory Visit: Payer: Self-pay | Admitting: Internal Medicine

## 2023-08-04 DIAGNOSIS — E785 Hyperlipidemia, unspecified: Secondary | ICD-10-CM

## 2023-08-07 DIAGNOSIS — M5481 Occipital neuralgia: Secondary | ICD-10-CM | POA: Diagnosis not present

## 2023-08-14 ENCOUNTER — Ambulatory Visit (INDEPENDENT_AMBULATORY_CARE_PROVIDER_SITE_OTHER): Payer: PPO

## 2023-08-14 DIAGNOSIS — D510 Vitamin B12 deficiency anemia due to intrinsic factor deficiency: Secondary | ICD-10-CM | POA: Diagnosis not present

## 2023-08-14 MED ORDER — CYANOCOBALAMIN 1000 MCG/ML IJ SOLN
1000.0000 ug | Freq: Once | INTRAMUSCULAR | Status: AC
Start: 2023-08-14 — End: 2023-08-14
  Administered 2023-08-14: 1000 ug via INTRAMUSCULAR

## 2023-08-14 NOTE — Progress Notes (Signed)
 Patient visits today for their b-12 injection. Patient informed of what they had received and tolerated injection well. Patient notified to reach out to office if needed.

## 2023-08-15 ENCOUNTER — Telehealth: Payer: Self-pay | Admitting: Cardiovascular Disease

## 2023-08-15 NOTE — Telephone Encounter (Signed)
 Patient with diagnosis of A Fib on Eliquis for anticoagulation.    Procedure: bladder biopsy Date of procedure: 10/01/23   CHA2DS2-VASc Score = 5  This indicates a 7.2% annual risk of stroke. The patient's score is based upon: CHF History: 0 HTN History: 1 Diabetes History: 1 Stroke History: 0 Vascular Disease History: 1 Age Score: 2 Gender Score: 0   CrCl 50 ml/min Platelet count 150K  Per office protocol, patient can hold Eliquis for 2 days prior to procedure.    **This guidance is not considered finalized until pre-operative APP has relayed final recommendations.**

## 2023-08-15 NOTE — Telephone Encounter (Signed)
 Dr.Berry,  Sharen Hones 89 year old male is requesting preoperative cardiac evaluation for bladder biopsy.  He was recently seen in clinic by you on 07/08/2023.  He remained stable from a cardiac standpoint.  He reported walking 3-4 times per week.  He denied chest pain and shortness of breath.  Please comment on cardiac risk for upcoming procedure.  Thank you for your help.  Please direct your response to CV DIV preop pool.  Thomasene Ripple. Maliik Karner NP-C     08/15/2023, 1:29 PM [facility] [address] Office [phone] Fax [phone]

## 2023-08-15 NOTE — Telephone Encounter (Signed)
   Pre-operative Risk Assessment    Patient Name: Dillon Young  DOB: [birth year] MRN: [id number]   Date of last office visit: 07/08/23 Date of next office visit: n/a   Request for Surgical Clearance    Procedure:   bladder biopsy   Date of Surgery:  Clearance 10/01/23                                Surgeon:  Dr, Annabell Howells Surgeon's Group or Practice Name:  [facility] Phone number:  [phone] Fax number:  [phone]   Type of Clearance Requested:   - Medical  - Pharmacy:  Hold Apixaban (Eliquis) 2 days prior    Type of Anesthesia:  General    Additional requests/questions:      Melissa Noon   08/15/2023, 10:29 AM

## 2023-08-19 NOTE — Telephone Encounter (Signed)
 I will forward clearance notes from Eligha Bridegroom, NP to requesting office.

## 2023-08-19 NOTE — Telephone Encounter (Signed)
 Rhonda from [facility] called to inform us the procedure date has been changed to September 10, 2023.

## 2023-08-19 NOTE — Telephone Encounter (Signed)
   Primary Cardiologist: Nanetta Batty, MD  Chart reviewed as part of pre-operative protocol coverage. Given past medical history and time since last visit, based on ACC/AHA guidelines, Dillon Young would be at acceptable risk for the planned procedure without further cardiovascular testing.   Patient was advised that if he develops new symptoms prior to surgery to contact our office to arrange a follow-up appointment. He verbalized understanding.  Per office protocol, patient can hold Eliquis for 2 days prior to procedure.    I will route this recommendation to the requesting party via Epic fax function and remove from pre-op pool.  Please call with questions.  Levi Aland, NP-C 08/19/2023, 7:49 AM [address] Office [phone] Fax [phone]

## 2023-08-20 ENCOUNTER — Other Ambulatory Visit: Payer: Self-pay | Admitting: Physician Assistant

## 2023-09-03 ENCOUNTER — Encounter (HOSPITAL_COMMUNITY): Payer: Self-pay | Admitting: Urology

## 2023-09-03 NOTE — Progress Notes (Addendum)
 Spoke w/ via phone for pre-op interview--- Dillon Young needs dos----  BMP and CBG per anesthesia.       Young results------Current EKG in Epic dated 07/31/23. Current A1C-6.6 in Epic dated 07/01/23 COVID test -----patient states asymptomatic no test needed Arrive at -------1200 NPO after MN NO Solid Food.  Clear liquids from MN until---1100 Pre-Surgery Ensure or G2:  Med rec completed Medications to take morning of surgery -----Coreg and Entresto Diabetic medication -----NONE AM of surgery  GLP1 agonist last dose: GLP1 instructions:  Patient instructed no nail polish to be worn day of surgery Patient instructed to bring photo id and insurance card day of surgery Patient aware to have Driver (ride ) / caregiver    for 24 hours after surgery - Wife Dillon Young Patient Special Instructions ----- Shower with antibacterial soap. Pre-Op special Instructions -----  Patient verbalized understanding of instructions that were given at this phone interview. Patient denies chest pain, sob, fever, cough at the interview.   Anesthesia Review:  PCP: Dr Yetta Barre Cardiologist :Dr Allyson Sabal. Clearance in Epic dated 08/19/23 by Lissa Hoard, NP  PPM/ ICD: Device Orders: Rep Notified:  Chest x-ray : EKG :07/31/23 Echo : Stress test: Cardiac Cath :   Activity level:  Sleep Study/ CPAP : Fasting Blood Sugar :      / Checks Blood Sugar -- times a day:    Blood Thinner/ Instructions /Last Dose: Eliquis-hold X 2 days prior to surgery per cardiologist instructions. ASA / Instructions/ Last Dose :

## 2023-09-03 NOTE — Progress Notes (Signed)
Pt. Needs orders for surgery. 

## 2023-09-04 ENCOUNTER — Encounter (HOSPITAL_COMMUNITY)
Admission: RE | Admit: 2023-09-04 | Discharge: 2023-09-04 | Disposition: A | Discharge status: Home / Self Care | Source: Ambulatory Visit | Attending: Urology | Admitting: Urology

## 2023-09-04 ENCOUNTER — Other Ambulatory Visit: Payer: Self-pay

## 2023-09-04 ENCOUNTER — Encounter (HOSPITAL_COMMUNITY): Payer: Self-pay

## 2023-09-04 VITALS — Ht 68.0 in | Wt 175.0 lb

## 2023-09-04 DIAGNOSIS — I10 Essential (primary) hypertension: Secondary | ICD-10-CM

## 2023-09-04 HISTORY — DX: Unspecified osteoarthritis, unspecified site: M19.90

## 2023-09-04 NOTE — Progress Notes (Signed)
 For Anesthesia: PCP - Etta Grandchild, MD  Cardiologist - Runell Gess, MD   Bowel Prep reminder:  Chest x-ray -  EKG - 07/31/23 Stress Test -  ECHO - 08/22/22 Cardiac Cath -  Pacemaker/ICD device last checked: Pacemaker orders received: Device Rep notified:  Spinal Cord Stimulator:N/A  Sleep Study - Yes CPAP - NO  Fasting Blood Sugar - N/A Checks Blood Sugar ___0__ times a day Date and result of last Hgb A1c-6.6: 07/01/23  Last dose of GLP1 agonist- N/A GLP1 instructions:   Last dose of SGLT-2 inhibitors- N/A SGLT-2 instructions:   Blood Thinner Instructions:Eliquis will be on hold after: 09/07/23 Aspirin Instructions: Last Dose:  Activity level: Can go up a flight of stairs and activities of daily living without stopping and without chest pain and/or shortness of breath   Able to exercise without chest pain and/or shortness of breath  Anesthesia review: Hx:HTN,CAD,CHF,CVA,DIA,CKD,OSA(NO CPAP).  Patient denies shortness of breath, fever, cough and chest pain at PAT appointment   Patient verbalized understanding of instructions that were given to them at the PAT appointment. Patient was also instructed that they will need to review over the PAT instructions again at home before surgery.

## 2023-09-10 ENCOUNTER — Encounter (HOSPITAL_COMMUNITY)
Admission: RE | Disposition: A | Discharge status: Home / Self Care | Payer: Self-pay | Source: Ambulatory Visit | Attending: Urology

## 2023-09-10 ENCOUNTER — Ambulatory Visit (HOSPITAL_BASED_OUTPATIENT_CLINIC_OR_DEPARTMENT_OTHER): Admitting: Anesthesiology

## 2023-09-10 ENCOUNTER — Ambulatory Visit (HOSPITAL_COMMUNITY)

## 2023-09-10 ENCOUNTER — Encounter (HOSPITAL_COMMUNITY): Payer: Self-pay | Admitting: Urology

## 2023-09-10 ENCOUNTER — Ambulatory Visit (HOSPITAL_COMMUNITY): Admitting: Anesthesiology

## 2023-09-10 ENCOUNTER — Ambulatory Visit (HOSPITAL_COMMUNITY)
Admission: RE | Admit: 2023-09-10 | Discharge: 2023-09-10 | Disposition: A | Discharge status: Home / Self Care | Source: Ambulatory Visit | Attending: Urology | Admitting: Urology

## 2023-09-10 DIAGNOSIS — Z8551 Personal history of malignant neoplasm of bladder: Secondary | ICD-10-CM | POA: Diagnosis not present

## 2023-09-10 DIAGNOSIS — I129 Hypertensive chronic kidney disease with stage 1 through stage 4 chronic kidney disease, or unspecified chronic kidney disease: Secondary | ICD-10-CM | POA: Diagnosis not present

## 2023-09-10 DIAGNOSIS — Z7951 Long term (current) use of inhaled steroids: Secondary | ICD-10-CM | POA: Diagnosis not present

## 2023-09-10 DIAGNOSIS — C679 Malignant neoplasm of bladder, unspecified: Secondary | ICD-10-CM | POA: Insufficient documentation

## 2023-09-10 DIAGNOSIS — J449 Chronic obstructive pulmonary disease, unspecified: Secondary | ICD-10-CM | POA: Diagnosis not present

## 2023-09-10 DIAGNOSIS — I110 Hypertensive heart disease with heart failure: Secondary | ICD-10-CM | POA: Diagnosis not present

## 2023-09-10 DIAGNOSIS — I2510 Atherosclerotic heart disease of native coronary artery without angina pectoris: Secondary | ICD-10-CM

## 2023-09-10 DIAGNOSIS — I509 Heart failure, unspecified: Secondary | ICD-10-CM | POA: Insufficient documentation

## 2023-09-10 DIAGNOSIS — I519 Heart disease, unspecified: Secondary | ICD-10-CM

## 2023-09-10 DIAGNOSIS — I4891 Unspecified atrial fibrillation: Secondary | ICD-10-CM | POA: Insufficient documentation

## 2023-09-10 DIAGNOSIS — C672 Malignant neoplasm of lateral wall of bladder: Secondary | ICD-10-CM | POA: Diagnosis not present

## 2023-09-10 DIAGNOSIS — N329 Bladder disorder, unspecified: Secondary | ICD-10-CM | POA: Diagnosis not present

## 2023-09-10 DIAGNOSIS — D494 Neoplasm of unspecified behavior of bladder: Secondary | ICD-10-CM | POA: Diagnosis not present

## 2023-09-10 DIAGNOSIS — Z87891 Personal history of nicotine dependence: Secondary | ICD-10-CM | POA: Insufficient documentation

## 2023-09-10 DIAGNOSIS — E119 Type 2 diabetes mellitus without complications: Secondary | ICD-10-CM | POA: Insufficient documentation

## 2023-09-10 DIAGNOSIS — I10 Essential (primary) hypertension: Secondary | ICD-10-CM

## 2023-09-10 DIAGNOSIS — E1122 Type 2 diabetes mellitus with diabetic chronic kidney disease: Secondary | ICD-10-CM | POA: Diagnosis not present

## 2023-09-10 DIAGNOSIS — Z8546 Personal history of malignant neoplasm of prostate: Secondary | ICD-10-CM | POA: Insufficient documentation

## 2023-09-10 DIAGNOSIS — N3041 Irradiation cystitis with hematuria: Secondary | ICD-10-CM | POA: Diagnosis not present

## 2023-09-10 DIAGNOSIS — N1832 Chronic kidney disease, stage 3b: Secondary | ICD-10-CM | POA: Diagnosis not present

## 2023-09-10 DIAGNOSIS — N3289 Other specified disorders of bladder: Secondary | ICD-10-CM | POA: Diagnosis not present

## 2023-09-10 HISTORY — PX: CYSTOSCOPY WITH BIOPSY: SHX5122

## 2023-09-10 LAB — CBC
HCT: 46.3 % (ref 39.0–52.0)
Hemoglobin: 14.2 g/dL (ref 13.0–17.0)
MCH: 25.4 pg — ABNORMAL LOW (ref 26.0–34.0)
MCHC: 30.7 g/dL (ref 30.0–36.0)
MCV: 82.7 fL (ref 80.0–100.0)
Platelets: 139 10*3/uL — ABNORMAL LOW (ref 150–400)
RBC: 5.6 MIL/uL (ref 4.22–5.81)
RDW: 15.5 % (ref 11.5–15.5)
WBC: 5.5 10*3/uL (ref 4.0–10.5)
nRBC: 0 % (ref 0.0–0.2)

## 2023-09-10 LAB — BASIC METABOLIC PANEL WITH GFR
Anion gap: 12 (ref 5–15)
BUN: 21 mg/dL (ref 8–23)
CO2: 25 mmol/L (ref 22–32)
Calcium: 9.3 mg/dL (ref 8.9–10.3)
Chloride: 103 mmol/L (ref 98–111)
Creatinine, Ser: 1.17 mg/dL (ref 0.61–1.24)
GFR, Estimated: 60 mL/min — ABNORMAL LOW (ref >60)
Glucose, Bld: 111 mg/dL — ABNORMAL HIGH (ref 70–99)
Potassium: 4.1 mmol/L (ref 3.5–5.1)
Sodium: 140 mmol/L (ref 135–145)

## 2023-09-10 LAB — GLUCOSE, CAPILLARY
Glucose-Capillary: 102 mg/dL — ABNORMAL HIGH (ref 70–99)
Glucose-Capillary: 90 mg/dL (ref 70–99)

## 2023-09-10 SURGERY — CYSTOSCOPY, WITH BIOPSY
Anesthesia: General | Site: Bladder

## 2023-09-10 MED ORDER — PROPOFOL 10 MG/ML IV BOLUS
INTRAVENOUS | Status: AC
  Filled 2023-09-10: qty 20

## 2023-09-10 MED ORDER — LACTATED RINGERS IV SOLN: INTRAVENOUS | Status: DC

## 2023-09-10 MED ORDER — IOHEXOL 300 MG/ML  SOLN
INTRAMUSCULAR | Status: DC | PRN
Start: 2023-09-10 — End: 2023-09-10
  Administered 2023-09-10: 8 mL

## 2023-09-10 MED ORDER — DEXAMETHASONE SODIUM PHOSPHATE 10 MG/ML IJ SOLN
INTRAMUSCULAR | Status: AC
  Filled 2023-09-10: qty 1

## 2023-09-10 MED ORDER — EPHEDRINE SULFATE-NACL 50-0.9 MG/10ML-% IV SOSY
PREFILLED_SYRINGE | INTRAVENOUS | Status: DC | PRN
Start: 2023-09-10 — End: 2023-09-10
  Administered 2023-09-10: 10 mg via INTRAVENOUS

## 2023-09-10 MED ORDER — CEFAZOLIN SODIUM 1 G IJ SOLR
INTRAMUSCULAR | Status: AC
  Filled 2023-09-10: qty 20

## 2023-09-10 MED ORDER — DEXAMETHASONE SODIUM PHOSPHATE 10 MG/ML IJ SOLN
INTRAMUSCULAR | Status: DC | PRN
  Administered 2023-09-10: 10 mg via INTRAVENOUS

## 2023-09-10 MED ORDER — STERILE WATER FOR IRRIGATION IR SOLN
Status: DC | PRN
Start: 2023-09-10 — End: 2023-09-10
  Administered 2023-09-10: 3000 mL

## 2023-09-10 MED ORDER — ONDANSETRON HCL 4 MG/2ML IJ SOLN
INTRAMUSCULAR | Status: DC | PRN
Start: 2023-09-10 — End: 2023-09-10
  Administered 2023-09-10: 4 mg via INTRAVENOUS

## 2023-09-10 MED ORDER — ONDANSETRON HCL 4 MG/2ML IJ SOLN
INTRAMUSCULAR | Status: AC
Start: 2023-09-10 — End: ?
  Filled 2023-09-10: qty 2

## 2023-09-10 MED ORDER — SODIUM CHLORIDE 0.9% FLUSH: 3.0000 mL | Freq: Two times a day (BID) | INTRAVENOUS | Status: DC

## 2023-09-10 MED ORDER — CHLORHEXIDINE GLUCONATE 0.12 % MT SOLN
15.0000 mL | Freq: Once | OROMUCOSAL | Status: DC
Start: 2023-09-10 — End: 2023-09-10

## 2023-09-10 MED ORDER — ORAL CARE MOUTH RINSE: 15.0000 mL | Freq: Once | OROMUCOSAL | Status: DC

## 2023-09-10 MED ORDER — FENTANYL CITRATE (PF) 100 MCG/2ML IJ SOLN
INTRAMUSCULAR | Status: AC
  Filled 2023-09-10: qty 2

## 2023-09-10 MED ORDER — PHENYLEPHRINE 80 MCG/ML (10ML) SYRINGE FOR IV PUSH (FOR BLOOD PRESSURE SUPPORT)
PREFILLED_SYRINGE | INTRAVENOUS | Status: DC | PRN
  Administered 2023-09-10: 80 ug via INTRAVENOUS

## 2023-09-10 MED ORDER — LIDOCAINE HCL (PF) 2 % IJ SOLN
INTRAMUSCULAR | Status: AC
  Filled 2023-09-10: qty 5

## 2023-09-10 MED ORDER — ORAL CARE MOUTH RINSE: 15.0000 mL | Freq: Once | OROMUCOSAL | Status: AC

## 2023-09-10 MED ORDER — LIDOCAINE HCL (CARDIAC) PF 100 MG/5ML IV SOSY
PREFILLED_SYRINGE | INTRAVENOUS | Status: DC | PRN
  Administered 2023-09-10: 50 mg via INTRAVENOUS

## 2023-09-10 MED ORDER — CHLORHEXIDINE GLUCONATE 0.12 % MT SOLN
15.0000 mL | Freq: Once | OROMUCOSAL | Status: AC
  Administered 2023-09-10: 15 mL via OROMUCOSAL

## 2023-09-10 MED ORDER — SODIUM CHLORIDE 0.9 % IR SOLN
Status: DC | PRN
  Administered 2023-09-10: 1000 mL

## 2023-09-10 MED ORDER — FENTANYL CITRATE (PF) 100 MCG/2ML IJ SOLN
INTRAMUSCULAR | Status: DC | PRN
  Administered 2023-09-10 (×2): 50 ug via INTRAVENOUS

## 2023-09-10 MED ORDER — INSULIN ASPART 100 UNIT/ML IJ SOLN: 0.0000 [IU] | INTRAMUSCULAR | Status: DC | PRN

## 2023-09-10 MED ORDER — CEFAZOLIN SODIUM-DEXTROSE 1-4 GM/50ML-% IV SOLN
INTRAVENOUS | Status: DC | PRN
  Administered 2023-09-10: 2 g via INTRAVENOUS

## 2023-09-10 MED ORDER — PROPOFOL 10 MG/ML IV BOLUS
INTRAVENOUS | Status: DC | PRN
  Administered 2023-09-10: 100 mg via INTRAVENOUS

## 2023-09-10 SURGICAL SUPPLY — 19 items
BAG URINE DRAIN 2000ML AR STRL (UROLOGICAL SUPPLIES) IMPLANT
BAG URO CATCHER STRL LF (MISCELLANEOUS) ×1 IMPLANT
CATH FOLEY 3WAY 30CC 22FR (CATHETERS) IMPLANT
CATH URETL OPEN 5X70 (CATHETERS) IMPLANT
DRAPE FOOT SWITCH (DRAPES) ×1 IMPLANT
ELECT REM PT RETURN 15FT ADLT (MISCELLANEOUS) ×1 IMPLANT
GLOVE SURG SS PI 8.0 STRL IVOR (GLOVE) ×1 IMPLANT
GOWN STRL SURGICAL XL XLNG (GOWN DISPOSABLE) ×1 IMPLANT
HOLDER FOLEY CATH W/STRAP (MISCELLANEOUS) IMPLANT
KIT TURNOVER KIT A (KITS) IMPLANT
LOOP CUT BIPOLAR 24F LRG (ELECTROSURGICAL) IMPLANT
MANIFOLD NEPTUNE II (INSTRUMENTS) ×1 IMPLANT
PACK CYSTO (CUSTOM PROCEDURE TRAY) ×1 IMPLANT
SUT ETHILON 3 0 PS 1 (SUTURE) IMPLANT
SYR 30ML LL (SYRINGE) IMPLANT
SYR TOOMEY IRRIG 70ML (MISCELLANEOUS) IMPLANT
SYRINGE TOOMEY IRRIG 70ML (MISCELLANEOUS) IMPLANT
TUBING CONNECTING 10 (TUBING) ×1 IMPLANT
TUBING UROLOGY SET (TUBING) ×1 IMPLANT

## 2023-09-10 NOTE — Discharge Instructions (Addendum)
 CYSTOSCOPY HOME CARE INSTRUCTIONS  Activity: Rest for the remainder of the day.  Do not drive or operate equipment today.  You may resume normal activities in one to two days as instructed by your physician.   Meals: Drink plenty of liquids and eat light foods such as gelatin or soup this evening.  You may return to a normal meal plan tomorrow.  Return to Work: You may return to work in one to two days or as instructed by your physician.  Special Instructions / Symptoms: Call your physician if any of these symptoms occur:   -persistent or heavy bleeding  -bleeding which continues after first few urination  -large blood clots that are difficult to pass  -urine stream diminishes or stops completely  -fever equal to or higher than 101 degrees Farenheit.  -cloudy urine with a strong, foul odor  -severe pain  You may start you Eliquis in 48 hours if the urine is clear.      Patient Signature:  ________________________________________________________  Nurse's Signature:  ________________________________________________________

## 2023-09-10 NOTE — Interval H&P Note (Signed)
 History and Physical Interval Note:  09/10/2023 1:26 PM  [name]  has presented today for surgery, with the diagnosis of BLADDER LESION.  The various methods of treatment have been discussed with the patient and family. After consideration of risks, benefits and other options for treatment, the patient has consented to  Procedure(s) with comments: CYSTOSCOPY, WITH BIOPSY (N/A) - BILATERAL RETROGRADE as a surgical intervention.  The patient's history has been reviewed, patient examined, no change in status, stable for surgery.  I have reviewed the patient's chart and labs.  Questions were answered to the patient's satisfaction.     Dillon Young

## 2023-09-10 NOTE — Op Note (Signed)
 Procedure: 1.  Cystoscopy with bladder biopsy and fulguration of 1.5 cm right lateral wall bladder lesion. 2.  Cystoscopy with bilateral retrograde pyelograms and interpretation.  Pre-op diagnosis: Bladder wall lesion with cytologic atypia and a history of bladder cancer.  Postop diagnosis: Same.  Surgeon: Dr. Homero Luster.  Anesthesia: General.  Specimen: Cup biopsies from the right lateral wall.  Drains: None.  EBL: None.  Complications: None.  Indications: The patient is an 89 year old male with a history of bladder cancer was recently seen for surveillance cystoscopy and was found to have an erythematous area with some early papillary changes on the right lateral wall.  The cytologic examination of the urine demonstrated atypia.  It was felt that biopsy and fulguration were indicated.  Procedure: He was taken operating room where he was given 2 g of Ancef.  General anesthetic was induced.  He was placed in lithotomy position and fitted with PAS hose.  His perineum and genitalia were prepped with Betadine solution and draped in usual sterile fashion.  Cystoscopy was performed using a 21 [place] scope and 30 degree lens.  Examination revealed a normal urethra.  The external sphincter was intact.  The prostatic urethra was widely patent from a prior TURP and there were radiation changes consistent with his treatment of prostate cancer.  Examination of bladder revealed moderate trabeculation with a few small cellules.  There is erythema on the right lateral wall surrounding a prior resection site with some early papillary changes noted in 1 area.  There was another resection site on the base of the bladder but it was unremarkable.  Ureteral orifices were unremarkable.  After completion of the thorough inspection cup biopsies were obtained in several areas from the lesion on the right lateral wall.  A total of 3 biopsies were obtained.  The lesion was then fulgurated with the Bugbee electrode  for hemostasis and destruction of the abnormal mucosa for an area about 1.5 cm in size.  After completion of the biopsy and fulguration he underwent bilateral retrograde pyelography with a 5 [place] open-ended catheter and Omnipaque.  Right retrograde pyelogram demonstrated a normal ureter and intrarenal collecting system.  Left retrograde pyelogram demonstrated normal ureter and intrarenal collecting system.  Final inspection revealed no bleeding and no bladder wall perforation so was felt a Foley was not indicated.  His bladder was drained and the cystoscope was removed.  Was taken down to lithotomy position, his anesthetic was reversed and he was moved recovery in stable condition.  There were no complications.

## 2023-09-10 NOTE — Anesthesia Postprocedure Evaluation (Signed)
 Anesthesia Post Note  Patient: Dillon Young  Procedure(s) Performed: CYSTOSCOPY, WITH BIOPSY/BILATERAL RETROGRADE (Bladder)     Patient location during evaluation: PACU Anesthesia Type: General Level of consciousness: awake and alert Pain management: pain level controlled Vital Signs Assessment: post-procedure vital signs reviewed and stable Respiratory status: spontaneous breathing, nonlabored ventilation and respiratory function stable Cardiovascular status: blood pressure returned to baseline and stable Postop Assessment: no apparent nausea or vomiting Anesthetic complications: no  No notable events documented.  Last Vitals:  Vitals:   09/10/23 1515 09/10/23 1530  BP: (!) 152/86 (!) 142/83  Pulse: 69 (!) 58  Resp: 11 15  Temp:    SpO2: 100% 98%    Last Pain:  Vitals:   09/10/23 1530  TempSrc:   PainSc: 0-No pain                 Genna Casimir,W. EDMOND

## 2023-09-10 NOTE — H&P (Signed)
 I have bladder cancer.  HPI: Dillon Young is a 89 year-old male established patient who is here for bladder cancer.    07/31/23: Dillon Young returns today for cystoscopy. His last BT was a Ta HG NMIBC in 8/22 and he had maintenance BCG in 9.24 He has a history of prostate cancer with remote radiation therapy with radiation cystitis. His UA is clear.   01/16/23: Dillon Young returns today for surveillance cystoscopy. His UA is clear. He will need BCG maintenance if the cystoscopy is negative. He continues to have intermittent hematuria that seems to occur when he is constipated but his UA is clear today.   10/24/22: Dillon Young returns today for surveillance cystoscopy. He last had maintenance BCG on 08/01/22. His UA has 3-10 RBC's which is chronic. He has occasional gross hematuria when he gets constipated and has to strain. His IPSS is 11. He has nocturia x3 and a reduced stream. He has had no flank pain or associated signs or symptoms.   07/04/22: Dillon Young returns today for surveillance cystoscopy. He had some transient painless hematuria last weekend. He last had a CT hematuria study in 7/23 and had a 7mm renal stone but he has no flank pain. His last BT was a Ta HG NMIBC in 8/22 and he had maintenance BCG in 7/23. He has a history of prostate cancer with remote radiation therapy with radiation cystitis. His last PSA was undetectible. He has chronic incontinence. He has had a prior TURP. His IPSS is 8 with nocturia x 2.   03/28/22: Dillon Young returns today in f/u for cystoscopy for his history of T1 HG NMIBC on initial resection in 4/22 and Ta HG NMIBC for a recurrence in 8/22. He had BCG x 3 after his last visit and also a CT hematuria study that showed a 7mm LLP stone but no other significant GU findings. He has a history of prostate cancer s/p seeds remotely with an undetectible PSA at last check in 7/22. His IPSS is 9 with nocturia x 4.   12/06/21: Dillon Young returns today for cystoscopy. He had a episode of hematuria on  Sunday. He had no dysuria. He has stable mild pyuria and bacteria. he has had no flank pain.   08/28/21: Dillon Young returns today for cystosocpy for his history below. He has his last BCG maintenance on 07/19/21. He has had no hematuria. His IPSS is 15 which his improved. UA is clear.   06/13/21: Dillon Young returns today in f/u for cystoscopy for his history of T1 HG NMIBC on initial resection in 4/22 and Ta HG NMIBC for a recurrence in 8/22. He has had induction BCG. He is doing well without hematuria.   04/14/21: Dillon Young returns today in f/u for cystoscopy. He completed BCG induction on 10/4 and tolerated that well. He has had no recent hematuria or dysuria. HE continue to have incontinence.   01/13/21: Dillon Young returns to review the pathology from his recent TURBT. He is doing well without further hematuria back on eliquis. His IPSS is 14 with a reduced stream and nocturia. He was found to have two 5-68mm papillary lesions on the dome and posterior wall that were Ta HG NMIBC. His previous tumor was T1. He has a history of prostate cancer with prior radiation and a PSA on 7/27 of <0.015. He quit smoking in 2010.   12/21/20: Dillon Young returns for the history noted below. he had a single episode of minimal hematuria about a month ago. He is otherwise doing well but has some mild  incontinence requiring a pad. His IPSS is 10 with nocturia x 3. He has been seen by PT for the incontinence and is doing better.   09/21/20: Dillon Young returns today in f/u. He completed BCG on 08/01/20 for his history of T1 HG NMIBC. He has had no hematuria but has some urgency with UUI and SUI requiring pads since the TURBT. He has no dysuria. He has a history of radiation cystitis from seeds for prostate cancer remotely. The last PSA was 0.0 on 07/10/19.   08/17/20: Patient with below noted past medical history. He presents today due to worsening urinary leakage. He reports that he has some control but is constantly dribbling. He has needed to wear  a pad due to this. this has progressively worsesned since his surgery and undergoing BCG. He denies fevers, chills, gross hematuria. He has tolerated the BCG treatments well and has finished 6 rounds. He does complain of some pain in his pelvic area that radiates down. He feels he is emptying his bladder when voiding, but is unsure due to leakage. He also recently noticed a small spot on the glans of his penis that has not been present before. He reports that it is painless. He has not noticed any discharge, or drainage from the area.    Dillon Young returns today in f/u from a recent TURBT of a lesion at the anterior bladder neck and prostatic urethra that proved to be a T1 HG NMIBC that I felt was well resected. He is having some postop frequency and urgency with small voids and some stress incontinence. He had no incontinence before hand. He has some initial hematuria. He has no other associated signs or symptoms.     CC: AUA Questions Scoring.  HPI:     AUA Symptom Score: Less than 20% of the time he has the sensation of not emptying his bladder completely when finished urinating. Less than 20% of the time he has to urinate again fewer than two hours after he has finished urinating. Less than 20% of the time he has to start and stop again several times when he urinates. He never finds it difficult to postpone urination. 50% of the time he has a weak urinary stream. Less than 50% of the time he has to push or strain to begin urination. He has to get up to urinate 3 times from the time he goes to bed until the time he gets up in the morning.   Calculated AUA Symptom Score: 11    ALLERGIES: No Known Drug Allergies    MEDICATIONS: Amlodipine Besylate  Cholecalciferol  Clobetasol Propionate  Dofetilide  Eliquis 5 mg tablet  Fexofenadine Hcl  Fluticasone Propionate  Ipratropium Bromide  Iron  Nitroglycerin  Rosuvastatin Calcium  Spironolactone  Triamcinolone  Valsartan  Vitamin C  Vitamin  D2     Notes: Pt forgot his medication list today. Denies changes from previous assessment.   GU PSH: Bladder Instill AntiCA Agent - 02/18/2023, 02/11/2023, 02/04/2023, 08/01/2022, 07/25/2022, 07/19/2022, 01/05/2022, 12/26/2021, 12/19/2021, 2023, 2023, 2023, 02/28/2021, 02/22/2021, 02/15/2021, 02/08/2021, 01/31/2021, 2022, 2022, 2022, 2022, 2022, 2022, 2022 Cystoscopy - 01/16/2023, 10/24/2022, 07/04/2022, 03/28/2022, 12/06/2021, 08/28/2021, 2023, 04/14/2021, 2022, 2022, 2021, 2019 Cystoscopy Retrogrades - 2022 Cystoscopy TURBT <2 cm - 2022, 2022 Cystoscopy Ureteroscopy - 2007 ESWL - 2007 Locm 300-399Mg /Ml Iodine,1Ml - 12/15/2021, 2019 TRANSPERI NEEDLE PLACE, PROS - 2009       PSH Notes: Colonoscopy (Fiberoptic), Surgery Prostate Transperineal Placement Of Needles, Cystoscopy With Ureteroscopy, Lithotripsy, Cath Stent Placement,  Back Surgery   NON-GU PSH: Cardiac Stent Placement Coronary Artery Bypass Grafting - 2017 Diagnostic Colonoscopy - 2017 Visit Complexity (formerly GPC1X) - 07/04/2022     GU PMH: Bladder Cancer overlapping sites - 02/18/2023, - 02/04/2023, - 08/01/2022, - 07/25/2022, - 07/19/2022, - 01/05/2022, - 01/02/2022, - 12/26/2021, - 12/19/2021, - 12/15/2021, - 2023, - 2023, - 2023, - 2023, - 02/28/2021, - 02/22/2021, - 02/15/2021, - 02/08/2021, - 01/31/2021, - 2022, - 2022 Bladder Cancer Anterior - 02/11/2023, - 2022, - 2022, - 2022, - 2022, - 2022, - 2022, He has a T1 HG NMIBC that was well resected from the anterior bladder neck and prostatic urethra. I discussed the significance of this finding and the recommendation for BCG induction and maintenance. I have reviewed the side effects of BCG therapy in detail and will get him set up to return in 2 weeks to initiate treatment, with the delay because of some residual hematuria. He will then see me in 3 months for a cytology prior to f/u cystoscopy. , - 2022 Bladder Stone - 01/16/2023, I will reassess at f/u. I don't think the prostatic stone needs removal at this time. , -  10/24/2022, 3mm in prostatic urethra. , - 2019 Gross hematuria (Stable), He continues to have intermittent bleeding that appears to be secondary to the stone and radiation changes. - 01/16/2023, (Stable), He has not had upper tract studies for about 2 years so with the hematuria, I will get a CT hematuria study. BUN/Cr today. , - 12/06/2021, Improving. , - 2022, He has chronic intermittent hematuria and now has a lesion on the anterior prostate that is of concern for a neoplasm. I will get him set up for a TUR biopsy and fulguration. Risks of bleeding, infection, incontinence, strictures, thrombotic events and anesthetic complications reviewed. Cytology sent today. , - 2021, He continues to have gross hematuria and may have had some improvement with a reduction in the Eliquis dosage. I am going to have him return for cystoscopy to reassess the degree of radiation cystitis and to r/o tumors. , - 2021, - 2021, Stable renal calculi, bilateral renal cysts without obstrutive signs on u/s today. The bladder was not visualized., - 2020 (Worsening), He has bleeding that is probably from the radiation cystitis and is facilitated by Eliquis and Plavix. I will have stop the plavix per a communciation with Dr. Harlie Lieu and he will hold the Eliquis for a few days. He will return in 2 weeks. If he has persistent bleeding, he will need cystoscopy and fulguration and possible hyperbaric O2 therapy., - 2020 (Improving), - 2019, I am going to get him set up for CT hematuria study and cystoscopy. I will get a BUN/Cr today. If the bleeding becomes heavy, I will have him hold the plavix. The bleeding could be from his stone or radiation cystitis but I need to r/o neoplasm as well. , - 2019, Gross hematuria, - 2015, Gross Hematuria, - 2014 History of bladder cancer, No recurrences noted. He will return in 1-2 weeks to start a 3 week course of BCG. He will then return in 6 months for cystoscopy. - 01/16/2023, No recurrences. F/u in 3 months  for cystoscopy and then BCG x 3. , - 10/24/2022, He has no recurrent tumors. He will return for maintenance BCG x 3 weeks and then cysto in 3 months. , - 07/04/2022, There is a small lesion on the right dome that is indeterminant. I will have him return in 2 weeks for a cytology  and repeat cystoscopy in 77months. , - 03/28/2022, NO recurrent bladder tumors are seen. He will return for 3 weeks of maintenance BCG and the 3 months for cystoscopy. , - 12/06/2021, There are some subtle bladder wall lesions. I will get a cytology and biopsy if positive. If negative I will just repeat cystoscopy in 77mo. , - 08/28/2021, No recurrences seen. Cytology sent. f/u for maintenance BCG x 3 and then cysto in 3 months. , - 2023, He has no recurrent tumors. He will return in 3 months for cystoscopy and will have subsequent BCG maintanence. , - 04/14/2021, He did well with the BCG but has a small recurrence on the right posterior wall about 5mm in size. I will get him set up to go to the OR for cystoscopy with TURBT and to reassess the prior resection bed in the prostate. I will instill gemcitabine. I reviewed the risks of bleeding, infection, injury to the bladder and adjacent structures, need for secondary procedures, urine leaks, thrombotic events and anesthetic complications. , - 2022, No recurrent tumors are seen. He has some delayed healing in the prostatic urethra that is probably contributing to his urgency and the UA changes. He will return in 3 months for cystoscopy and possible BCG maintenance. , - 2022 Mixed incontinence - 01/16/2023, His prostate is widely patent and he empties well. , - 2022, He has some sphincter damage that is probably a combination of radiation effect and instrumentation. I will have him see PT. , - 2022 Nocturia, I will try him on Gemtesa and gave him 2 weeks of samples to try. - 01/16/2023, He has nocturia x 4. I recommended cutting out coffee and bladder irritants and if that doesn't work we can consider  medications. , - 2021 (Stable), - 2021 (Stable), - 2018, Nocturia, - 2017 Radiation cystitis (with hematuria) - 01/16/2023, He has some intermittent hematuria that is probably secondary to the radiation changes and prostatic stone. , - 10/24/2022 (Stable), He had an episode of hematuria that is most likely from the radiation changes. If it recurs I will repeat the CT but he had only a stone on CT last summer. , - 07/04/2022, He has radiation changes in the prostatic urethra with some anterior dystrophic calcification. This could be the cause of the hematuria. , - 12/06/2021, - 08/28/2021, - 2021, The bleeding is most likely from the radiation change in the prostate with the associated stone in the prostatic urethral. He passed that after cystoscopy., - 2019 Microscopic hematuria - 10/24/2022 History of prostate cancer, He has no concerning symptoms - 07/04/2022, - 03/28/2022, His last PSA was undetectible., - 08/28/2021, His PSA remains undetectible. , - 2022, I will get a PSA as he is overdue. , - 2022, - 2022, His PSA was undetectible at last check and there was no prostate cancer on the biopsy. , - 2022, His PSA remained undetectible earlier this year. , - 2021, History of prostate cancer, - 2017 Stress Incontinence, He has stable incontinence with an incompetent sphincter. - 07/04/2022, - 2022 Continuous incontinence - 03/28/2022, He has radiation effect at the sphincter that is the cause of the incontinence. , - 04/14/2021, - 2022, - 2022, - 2022 Renal calculus, He has a 7mm LLP stone that could be associated with the microhematuria. No treatment at this time. - 03/28/2022, - 08/28/2021, He has a left renal stone but no symptoms., - 2021, - 2021, Nephrolithiasis, - 2017 Bladder Cancer Dome - 2022 Bladder Cancer Posterior, He had two  small Ta HG recurrences following initial induction BCG after TURBT for T1 disease. He is doing well. I will get him set up to return for a second induction course of BCG and then do  cystoscopy in about 3-4 months. - 2022, - 2022 Dysuria - 2022 Urinary Frequency (Stable) - 2022, Urinary frequency, - 2014 Urinary Urgency - 2022 Prostate, Neoplasm of uncertain behavior - 2021 Renal cyst - 2021, Renal cysts, acquired, bilateral, - 2014 Prostate Cancer - 2018 LLQ pain, Abdominal pain, LLQ (left lower quadrant) - 2017 ED due to arterial insufficiency, Erectile dysfunction due to arterial insufficiency - 2016 Elevated PSA, PSA,Elevated - 2014 Urinary Retention, Unspec, Acute Urinary Retention - 2014      PMH Notes:  2006-05-24 16:43:03 - Note: Coronary Artery Disease  2012-07-24 13:53:37 - Note: Bladder Calculus   NON-GU PMH: Muscle weakness (generalized) - 2022, - 2022 Other specified disorders of muscle - 2022, - 2022 Bacteriuria, I covered him with cipro for the cysto and will check a culture but this is probably from the delayed healing in the prostate. - 2022 Encounter for general adult medical examination without abnormal findings, Encounter for preventive health examination - 2017 Nausea, Nausea - 2017 Personal history of other diseases of the digestive system, History of diverticulitis of colon - 2017 Personal history of other diseases of the circulatory system, History of cardiac disorder - 2014, History of hypertension, - 2014 Personal history of other endocrine, nutritional and metabolic disease, History of hypercholesterolemia - 2014 Atrial Fibrillation    FAMILY HISTORY: Family Health Status Number - Father Urologic Disorder - Father   SOCIAL HISTORY: Marital Status: Married Preferred Language: English; Ethnicity: Not Hispanic Or Latino; Race: White Current Smoking Status: Patient does not smoke anymore. Has not smoked since 09/13/2008.   Tobacco Use Assessment Completed: Used Tobacco in last 30 days? Social Drinker.  Drinks 1 caffeinated drink per day.     Notes: Former smoker, Tobacco Use, Marital History - Currently Married, Caffeine Use, Death In  The Family Mother, Death In The Family Father, Occupation:   REVIEW OF SYSTEMS:    GU Review Male:   Patient reports get up at night to urinate. Patient denies frequent urination, hard to postpone urination, burning/ pain with urination, leakage of urine, stream starts and stops, trouble starting your stream, have to strain to urinate , erection problems, and penile pain.  Gastrointestinal (Upper):   Patient denies nausea, vomiting, and indigestion/ heartburn.  Gastrointestinal (Lower):   Patient denies diarrhea and constipation.  Constitutional:   Patient denies fatigue, weight loss, night sweats, and fever.  Skin:   Patient denies skin rash/ lesion and itching.  Eyes:   Patient denies blurred vision and double vision.  Ears/ Nose/ Throat:   Patient denies sore throat and sinus problems.  Hematologic/Lymphatic:   Patient denies swollen glands and easy bruising.  Cardiovascular:   Patient denies leg swelling and chest pains.  Respiratory:   Patient denies cough and shortness of breath.  Endocrine:   Patient denies excessive thirst.  Musculoskeletal:   Patient denies back pain and joint pain.  Neurological:   Patient denies headaches and dizziness.  Psychologic:   Patient denies depression and anxiety.   VITAL SIGNS: None   Complexity of Data:   12/21/20 07/10/19 09/09/17 09/04/16 08/27/15 08/17/14 07/15/13 06/25/12  PSA  Total PSA <0.015 ng/mL 0.0 ng/ml <0.015 ng/mL < 0.015 ng/dl <4.78  2.95  6.21  3.08     PROCEDURES:  Flexible Cystoscopy - 52000  Risks, benefits, and some of the potential complications of the procedure were discussed. He was prepped with betadine and the urethral was instilled with 2% lidocaine jelly. Cipro 500mg  given for antibiotic prophylaxis.     Meatus:  Normal size. Normal location. Normal condition.  Urethra:  No strictures.  External Sphincter:  Normal. except for some radiation changes and mild narrowing.   Verumontanum:  Normal.  Prostate:   Non-obstructing. No hyperplasia. radiation neovascularity and blanching.  Bladder Neck:  Non-obstructing.  Ureteral Orifices:  Normal location. Normal size. Normal shape. Effluxed clear urine.  Bladder:  Mild trabeculation. Erythematous mucosa very slight on right dome with possible a very small area of early papillary changes. Also radiation neovascularity at the base. . No tumors. No stones.      The procedure was well tolerated and there were no complications.         Urinalysis Dipstick Dipstick Cont'd  Color: Yellow Bilirubin: Neg mg/dL  Appearance: Clear Ketones: Neg mg/dL  Specific Gravity: 1.610 Blood: Neg ery/uL  pH: 6.5 Protein: Neg mg/dL  Glucose: Neg mg/dL Urobilinogen: 0.2 mg/dL    Nitrites: Neg    Leukocyte Esterase: Neg leu/uL    ASSESSMENT:      ICD-10 Details  1 GU:   History of bladder cancer - Z85.51 Chronic, Stable - There possibly some very early papillary changes on the dome. I will get a cytology and if that is negative have him return for his last BCG maintenance, but if it is positive, we will need to take him for a biopsy. He will otherwise return in 3 months for cystoscopy.   2   Radiation cystitis (with hematuria) - N30.41 Minor - HE has stable radiation changes in the bladder.      PLAN:           Orders Labs Urine Cytology          Schedule Labs: 3 Months - Urinalysis  Return Visit/Planned Activity: 3 Months - Office Visit, Cystoscopy  Return Visit/Planned Activity: 1-2 Weeks - Nurse Visit             Note: BCG weekly x 3.           Document Letter(s):  Created for Patient: Clinical Summary         Notes:   CC: Dr. Sandra Crouch

## 2023-09-10 NOTE — Anesthesia Preprocedure Evaluation (Signed)
 Anesthesia Evaluation  Patient identified by MRN, date of birth, ID band Patient awake    Reviewed: Allergy & Precautions, H&P , NPO status , Patient's Chart, lab work & pertinent test results  Airway Mallampati: II  TM Distance: >3 FB Neck ROM: Full    Dental no notable dental hx. (+) Teeth Intact, Dental Advisory Given   Pulmonary COPD,  COPD inhaler, former smoker   Pulmonary exam normal breath sounds clear to auscultation       Cardiovascular hypertension, Pt. on medications and Pt. on home beta blockers + CAD, + Cardiac Stents and +CHF  + dysrhythmias Atrial Fibrillation  Rhythm:Regular Rate:Normal     Neuro/Psych  Headaches  Anxiety     CVA, No Residual Symptoms    GI/Hepatic Neg liver ROS,GERD  ,,  Endo/Other  diabetes    Renal/GU Renal disease  negative genitourinary   Musculoskeletal  (+) Arthritis , Osteoarthritis,    Abdominal   Peds  Hematology  (+) Blood dyscrasia, anemia   Anesthesia Other Findings   Reproductive/Obstetrics negative OB ROS                             Anesthesia Physical Anesthesia Plan  ASA: 3  Anesthesia Plan: General   Post-op Pain Management:    Induction: Intravenous  PONV Risk Score and Plan: 3 and Ondansetron, Dexamethasone and Treatment may vary due to age or medical condition  Airway Management Planned: LMA  Additional Equipment:   Intra-op Plan:   Post-operative Plan: Extubation in OR  Informed Consent: I have reviewed the patients History and Physical, chart, labs and discussed the procedure including the risks, benefits and alternatives for the proposed anesthesia with the patient or authorized representative who has indicated his/her understanding and acceptance.     Dental advisory given  Plan Discussed with: CRNA  Anesthesia Plan Comments:        Anesthesia Quick Evaluation

## 2023-09-10 NOTE — Anesthesia Procedure Notes (Signed)
 Procedure Name: LMA Insertion Date/Time: 09/10/2023 1:59 PM  Performed by: [place], CRNAPre-anesthesia Checklist: Patient identified, Emergency Drugs available, Suction available, Patient being monitored and Timeout performed Patient Re-evaluated:Patient Re-evaluated prior to induction Oxygen Delivery Method: Circle system utilized Preoxygenation: Pre-oxygenation with 100% oxygen Induction Type: IV induction Ventilation: Mask ventilation without difficulty LMA: LMA inserted LMA Size: 5.0 Tube type: Oral Number of attempts: 1 Placement Confirmation: positive ETCO2 and breath sounds checked- equal and bilateral Secured at: 22 cm Tube secured with: Tape Dental Injury: Teeth and Oropharynx as per pre-operative assessment

## 2023-09-10 NOTE — Transfer of Care (Signed)
 Immediate Anesthesia Transfer of Care Note  Patient: Dillon Young  Procedure(s) Performed: CYSTOSCOPY, WITH BIOPSY/BILATERAL RETROGRADE (Bladder)  Patient Location: PACU  Anesthesia Type:General  Level of Consciousness: awake and alert   Airway & Oxygen Therapy: Patient Spontanous Breathing and Patient connected to nasal cannula oxygen  Post-op Assessment: Report given to RN and Post -op Vital signs reviewed and stable  Post vital signs: Reviewed and stable  Last Vitals:  Vitals Value Taken Time  BP 136/85 09/10/23 1445  Temp    Pulse 88 09/10/23 1450  Resp 14 09/10/23 1450  SpO2 100 % 09/10/23 1450  Vitals shown include unfiled device data.  Last Pain:  Vitals:   09/10/23 1200  TempSrc:   PainSc: 0-No pain      Patients Stated Pain Goal: 4 (09/10/23 1200)  Complications: No notable events documented.

## 2023-09-11 ENCOUNTER — Encounter (HOSPITAL_COMMUNITY): Payer: Self-pay | Admitting: Urology

## 2023-09-11 ENCOUNTER — Ambulatory Visit

## 2023-09-11 LAB — SURGICAL PATHOLOGY

## 2023-09-16 ENCOUNTER — Ambulatory Visit (INDEPENDENT_AMBULATORY_CARE_PROVIDER_SITE_OTHER)

## 2023-09-16 DIAGNOSIS — D510 Vitamin B12 deficiency anemia due to intrinsic factor deficiency: Secondary | ICD-10-CM | POA: Diagnosis not present

## 2023-09-16 MED ORDER — CYANOCOBALAMIN 1000 MCG/ML IJ SOLN
1000.0000 ug | Freq: Once | INTRAMUSCULAR | Status: AC
  Administered 2023-09-16: 1000 ug via INTRAMUSCULAR

## 2023-09-16 NOTE — Progress Notes (Signed)
 Patient visits today to receive her B12 injection/vaccine. Patient was informed and tolerated well. Patient was notified to reach out to [place] if needed.

## 2023-09-18 ENCOUNTER — Other Ambulatory Visit: Payer: Self-pay | Admitting: Urology

## 2023-09-20 ENCOUNTER — Ambulatory Visit

## 2023-09-26 ENCOUNTER — Other Ambulatory Visit: Payer: Self-pay | Admitting: Family

## 2023-09-26 ENCOUNTER — Other Ambulatory Visit: Payer: Self-pay | Admitting: Internal Medicine

## 2023-09-26 DIAGNOSIS — I4819 Other persistent atrial fibrillation: Secondary | ICD-10-CM

## 2023-09-26 DIAGNOSIS — E785 Hyperlipidemia, unspecified: Secondary | ICD-10-CM

## 2023-09-26 DIAGNOSIS — I519 Heart disease, unspecified: Secondary | ICD-10-CM

## 2023-09-30 ENCOUNTER — Ambulatory Visit (HOSPITAL_COMMUNITY)
Admission: RE | Admit: 2023-09-30 | Discharge: 2023-09-30 | Disposition: A | Discharge status: Home / Self Care | Source: Ambulatory Visit | Attending: *Deleted | Admitting: *Deleted

## 2023-09-30 DIAGNOSIS — E1122 Type 2 diabetes mellitus with diabetic chronic kidney disease: Secondary | ICD-10-CM | POA: Diagnosis not present

## 2023-09-30 DIAGNOSIS — I5022 Chronic systolic (congestive) heart failure: Secondary | ICD-10-CM | POA: Diagnosis not present

## 2023-09-30 DIAGNOSIS — N189 Chronic kidney disease, unspecified: Secondary | ICD-10-CM | POA: Diagnosis not present

## 2023-09-30 DIAGNOSIS — I509 Heart failure, unspecified: Secondary | ICD-10-CM | POA: Insufficient documentation

## 2023-09-30 DIAGNOSIS — I2510 Atherosclerotic heart disease of native coronary artery without angina pectoris: Secondary | ICD-10-CM | POA: Insufficient documentation

## 2023-09-30 DIAGNOSIS — I779 Disorder of arteries and arterioles, unspecified: Secondary | ICD-10-CM | POA: Diagnosis not present

## 2023-09-30 DIAGNOSIS — I130 Hypertensive heart and chronic kidney disease with heart failure and stage 1 through stage 4 chronic kidney disease, or unspecified chronic kidney disease: Secondary | ICD-10-CM | POA: Insufficient documentation

## 2023-09-30 LAB — ECHOCARDIOGRAM COMPLETE
AR max vel: 2.01 cm2
AV Area VTI: 2.24 cm2
AV Area mean vel: 2.08 cm2
AV Mean grad: 2.7 mmHg
AV Peak grad: 4.3 mmHg
Ao pk vel: 1.03 m/s
Area-P 1/2: 6.17 cm2
Calc EF: 48 %
MV VTI: 2.94 cm2
S' Lateral: 3.1 cm
Single Plane A2C EF: 48.6 %
Single Plane A4C EF: 44 %

## 2023-09-30 NOTE — Progress Notes (Signed)
  Echocardiogram 2D Echocardiogram has been performed.  Azion Centrella L Omelia Marquart RDCS 09/30/2023, 11:24 AM

## 2023-10-04 ENCOUNTER — Other Ambulatory Visit: Payer: Self-pay | Admitting: Internal Medicine

## 2023-10-04 DIAGNOSIS — E1140 Type 2 diabetes mellitus with diabetic neuropathy, unspecified: Secondary | ICD-10-CM

## 2023-10-05 ENCOUNTER — Other Ambulatory Visit: Payer: Self-pay

## 2023-10-05 ENCOUNTER — Encounter (HOSPITAL_COMMUNITY): Payer: Self-pay

## 2023-10-05 ENCOUNTER — Emergency Department (HOSPITAL_COMMUNITY): Admission: EM | Admit: 2023-10-05 | Discharge: 2023-10-05 | Disposition: A | Discharge status: Home / Self Care

## 2023-10-05 DIAGNOSIS — R310 Gross hematuria: Secondary | ICD-10-CM | POA: Diagnosis not present

## 2023-10-05 DIAGNOSIS — R339 Retention of urine, unspecified: Secondary | ICD-10-CM | POA: Diagnosis not present

## 2023-10-05 DIAGNOSIS — R1030 Lower abdominal pain, unspecified: Secondary | ICD-10-CM | POA: Insufficient documentation

## 2023-10-05 DIAGNOSIS — Z7901 Long term (current) use of anticoagulants: Secondary | ICD-10-CM | POA: Insufficient documentation

## 2023-10-05 DIAGNOSIS — Z8546 Personal history of malignant neoplasm of prostate: Secondary | ICD-10-CM | POA: Diagnosis not present

## 2023-10-05 LAB — BASIC METABOLIC PANEL WITH GFR
Anion gap: 8 (ref 5–15)
BUN: 20 mg/dL (ref 8–23)
CO2: 26 mmol/L (ref 22–32)
Calcium: 9.5 mg/dL (ref 8.9–10.3)
Chloride: 105 mmol/L (ref 98–111)
Creatinine, Ser: 1.22 mg/dL (ref 0.61–1.24)
GFR, Estimated: 57 mL/min — ABNORMAL LOW (ref >60)
Glucose, Bld: 111 mg/dL — ABNORMAL HIGH (ref 70–99)
Potassium: 4.1 mmol/L (ref 3.5–5.1)
Sodium: 139 mmol/L (ref 135–145)

## 2023-10-05 LAB — URINALYSIS, W/ REFLEX TO CULTURE (INFECTION SUSPECTED)
Bacteria, UA: NONE SEEN
Bilirubin Urine: NEGATIVE
Glucose, UA: 50 mg/dL — AB
Ketones, ur: NEGATIVE mg/dL
Leukocytes,Ua: NEGATIVE
Nitrite: NEGATIVE
Protein, ur: 100 mg/dL — AB
RBC / HPF: >50 RBC/hpf (ref 0–5)
Specific Gravity, Urine: 1.011 (ref 1.005–1.030)
pH: 7 (ref 5.0–8.0)

## 2023-10-05 LAB — CBC
HCT: 46.9 % (ref 39.0–52.0)
Hemoglobin: 14.9 g/dL (ref 13.0–17.0)
MCH: 26.3 pg (ref 26.0–34.0)
MCHC: 31.8 g/dL (ref 30.0–36.0)
MCV: 82.9 fL (ref 80.0–100.0)
Platelets: 159 10*3/uL (ref 150–400)
RBC: 5.66 MIL/uL (ref 4.22–5.81)
RDW: 15.9 % — ABNORMAL HIGH (ref 11.5–15.5)
WBC: 3.7 10*3/uL — ABNORMAL LOW (ref 4.0–10.5)
nRBC: 0 % (ref 0.0–0.2)

## 2023-10-05 MED ORDER — SODIUM CHLORIDE 0.9 % IR SOLN
3000.0000 mL | Status: DC
  Administered 2023-10-05: 3000 mL

## 2023-10-05 NOTE — ED Provider Notes (Signed)
 [place] EMERGENCY DEPARTMENT AT [facility] Provider Note   CSN: [id number] Arrival date & time: 10/05/23  0845     History  Chief Complaint  Patient presents with   Urinary Retention    Dillon Young is a 89 y.o. male.  89 year old male presenting emergency department for urinary retention.  Reports history of prostate issues and has had some intermittent flow issues over the past week.  Last night had gross hematuria, was unable to urinate this morning.  Complaining of suprapubic abdominal pain.  No nausea no vomiting.  No fevers no chills.        Home Medications Prior to Admission medications   Medication Sig Start Date End Date Taking? Authorizing Provider  acetaminophen  (TYLENOL ) 500 MG tablet Take 500-1,000 mg by mouth daily as needed for mild pain or headache.    [provider]  ascorbic acid  (VITAMIN C ) 500 MG tablet Take 500 mg by mouth 2 (two) times a week.    [provider]  carvedilol  (COREG ) 3.125 MG tablet TAKE 1 TABLET BY MOUTH TWICE A DAY WITH A MEAL *TAKE WITH 6.25MG  TO EQUAL 9.375MG  TWICE A DAY* 06/24/23   Jarold Merlin B, FNP  Cholecalciferol  (VITAMIN D -3) 25 MCG (1000 UT) CAPS Take 1,000 Units by mouth daily.    [provider]  dicyclomine  (BENTYL ) 10 MG capsule TAKE 1 CAPSULE (10 MG TOTAL) BY MOUTH EVERY 6 (SIX) HOURS AS NEEDED (ABDOMINAL PAIN). 03/16/22   Esterwood, Amy S, PA-C  ELIQUIS  5 MG TABS tablet TAKE 1 TABLET BY MOUTH TWICE A DAY 06/10/23   Avanell Leigh, MD  fexofenadine (ALLEGRA) 180 MG tablet Take 180 mg by mouth daily as needed for allergies or rhinitis.     [provider]  Fluocinolone  Acetonide 0.01 % OIL 3 drops in affected ear(s) once or twice daily as needed for itching 07/09/23   [provider]  fluticasone  (FLONASE ) 50 MCG/ACT nasal spray Place 2 sprays into both nostrils daily. 12/20/22   Wilfredo Hanly, MD  gabapentin  (NEURONTIN ) 300 MG capsule Take 1 capsule (300 mg  total) by mouth 3 (three) times daily. Patient taking differently: Take 300 mg by mouth as needed. 06/27/22   Arcadio Knuckles, MD  hydrocortisone  (ANUSOL -HC) 2.5 % rectal cream Place 1 application rectally 3 (three) times daily as needed for hemorrhoids. 02/21/21   Kenney Peacemaker, MD  Hypromellose (ARTIFICIAL TEARS OP) Place 1 drop into both eyes 4 (four) times daily as needed (dry eyes).    [provider]  ipratropium (ATROVENT ) 0.06 % nasal spray Place 2 sprays into both nostrils 2 (two) times daily. 12/20/22   Wilfredo Hanly, MD  melatonin 5 MG TABS Take 5 mg by mouth at bedtime as needed (sleep).    [provider]  nitroGLYCERIN  (NITROSTAT ) 0.4 MG SL tablet Place 1 tablet (0.4 mg total) under the tongue every 5 (five) minutes as needed for chest pain. 10/18/21   Darlis Eisenmenger, MD  rosuvastatin  (CRESTOR ) 10 MG tablet TAKE 1 TABLET BY MOUTH EVERY DAY 08/05/23   Arcadio Knuckles, MD  sacubitril -valsartan  (ENTRESTO ) 49-51 MG Take 1 tablet by mouth 2 (two) times daily. 07/31/23   Lee, [place], NP  spironolactone  (ALDACTONE ) 25 MG tablet Take 1 tablet (25 mg total) by mouth daily. Patient taking differently: Take 12.5 mg by mouth daily. 01/14/23 04/30/23  Tania Familia, NP  Tiotropium Bromide-Olodaterol (STIOLTO RESPIMAT ) 2.5-2.5 MCG/ACT AERS Inhale 2 puffs into the lungs daily.  12/20/22   Wilfredo Hanly, MD  triamcinolone cream (KENALOG) 0.1 % Apply 1 application topically daily as needed (to affected areas, for itching).  01/06/19   [provider]      Allergies    Lipitor [atorvastatin ] and Other    Review of Systems   Review of Systems  Physical Exam Updated Vital Signs BP (!) 108/54   Pulse (!) 54   Temp 97.8 F (36.6 C) (Oral)   Resp 16   Ht 5\' 10"  (1.778 m)   Wt 79.4 kg   SpO2 98%   BMI 25.11 kg/m  Physical Exam Vitals and nursing note reviewed. Exam conducted with a chaperone present.  Constitutional:      General: He is not in acute  distress.    Appearance: He is not toxic-appearing.  HENT:     Head: Normocephalic.     Nose: Nose normal.     Mouth/Throat:     Mouth: Mucous membranes are moist.  Eyes:     Conjunctiva/sclera: Conjunctivae normal.  Cardiovascular:     Rate and Rhythm: Normal rate.  Pulmonary:     Effort: Pulmonary effort is normal.  Abdominal:     General: Abdomen is flat. There is no distension.     Tenderness: There is abdominal tenderness (mild suprapubic). There is no guarding or rebound.  Genitourinary:    Penis: Normal.      Testes: Normal.  Musculoskeletal:        General: Normal range of motion.  Skin:    General: Skin is warm.     Capillary Refill: Capillary refill takes less than 2 seconds.  Neurological:     Mental Status: He is alert.  Psychiatric:        Mood and Affect: Mood normal.        Behavior: Behavior normal.     ED Results / Procedures / Treatments   Labs (all labs ordered are listed, but only abnormal results are displayed) Labs Reviewed  CBC - Abnormal; Notable for the following components:      Result Value   WBC 3.7 (*)    RDW 15.9 (*)    All other components within normal limits  BASIC METABOLIC PANEL WITH GFR - Abnormal; Notable for the following components:   Glucose, Bld 111 (*)    GFR, Estimated 57 (*)    All other components within normal limits  URINALYSIS, W/ REFLEX TO CULTURE (INFECTION SUSPECTED) - Abnormal; Notable for the following components:   Color, Urine RED (*)    APPearance HAZY (*)    Glucose, UA 50 (*)    Hgb urine dipstick MODERATE (*)    Protein, ur 100 (*)    All other components within normal limits    EKG None  Radiology No results found.  Procedures Procedures    Medications Ordered in ED Medications  sodium chloride  irrigation 0.9 % 3,000 mL (3,000 mLs Irrigation New Bag/Given 10/05/23 1053)    ED Course/ Medical Decision Making/ A&P Clinical Course as of 10/05/23 1332  Sat Oct 05, 2023  0906 Cystoscopy last  month per chart review: " Procedure: 1.  Cystoscopy with bladder biopsy and fulguration of 1.5 cm right lateral wall bladder lesion. 2.  Cystoscopy with bilateral retrograde pyelograms and interpretation.   Pre-op diagnosis: Bladder wall lesion with cytologic atypia and a history of bladder cancer.   Postop diagnosis: Same.  " [TY]  1610 Per last urology note with Dr. Inga Manges: "07/31/23: Mohamadou returns today  for cystoscopy. His last BT was a Ta HG NMIBC in 8/22 and he had maintenance BCG in 9.24 He has a history of prostate cancer with remote radiation therapy with radiation cystitis. His UA is clear." [TY]  1007 Hemoglobin: 14.9 No anemia [TY]  1028 Foley catheter placed by nurse, gross hematuria noted.  Small amount output, still having some suprapubic discomfort.  Will irrigate [TY]  1151 Was able to drain some clots; patient feeling improved. Light pink colored fluid/urine curently.  [TY]    Clinical Course User Index [TY] Rolinda Climes, DO                                 Medical Decision Making 89 year old male presenting emergency department for urinary retention.  Patient with gross hematuria.  See ED course for chart review.  Vital signs reassuring.  Foley catheter placed, continuous bladder irrigation until light pink-tinged.  Suprapubic abdominal pain symptoms have resolved.  CBC without anemia.  No kidney injury.  UA not indicative of urinary tract infection.  Wife notes that he has urology appointment on Wednesday.  Will leave Foley catheter in.  Discussed return precautions.  Stable for discharge at this time.  Amount and/or Complexity of Data Reviewed Labs: ordered. Decision-making details documented in ED Course.  Risk Prescription drug management.         Final Clinical Impression(s) / ED Diagnoses Final diagnoses:  None    Rx / DC Orders ED Discharge Orders     None         Rolinda Climes, DO 10/05/23 1332

## 2023-10-05 NOTE — Discharge Instructions (Signed)
 Please follow-up with your urologist as planned.  Return if your Foley is draining urine, you develop abdominal pain, lightheadedness, passout, chest pain, shortness of breath, fevers or any new or worsening symptoms that are concerning to you.

## 2023-10-05 NOTE — ED Triage Notes (Signed)
 Pt to er, pt states that he last urinated last night, states that since then it has been blood and he has been unable to void.  Pt attempting to void and is unable.

## 2023-10-07 NOTE — Patient Instructions (Signed)
 SURGICAL WAITING ROOM VISITATION  Patients having surgery or a procedure may have no more than 2 support people in the waiting area - these visitors may rotate.    Children under the age of 37 must have an adult with them who is not the patient.  Due to an increase in RSV and influenza rates and associated hospitalizations, children ages 8 and under may not visit patients in [facility] hospitals.  Visitors with respiratory illnesses are discouraged from visiting and should remain at home.  If the patient needs to stay at the hospital during part of their recovery, the visitor guidelines for inpatient rooms apply. Pre-op nurse will coordinate an appropriate time for 1 support person to accompany patient in pre-op.  This support person may not rotate.    Please refer to the [facility] website for the visitor guidelines for Inpatients (after your surgery is over and you are in a regular room).       Your procedure is scheduled on:  10/15/2023    Report to [facility] Main Entrance    Report to admitting at  1130  AM   Call this number if you have problems the morning of surgery [phone]   Do not eat food  or drink liquids :After Midnight.                           If you have questions, please contact your surgeon's office.       Oral Hygiene is also important to reduce your risk of infection.                                    Remember - BRUSH YOUR TEETH THE MORNING OF SURGERY WITH YOUR REGULAR TOOTHPASTE  DENTURES WILL BE REMOVED PRIOR TO SURGERY PLEASE DO NOT APPLY "Poly grip" OR ADHESIVES!!!   Do NOT smoke after Midnight   Stop all vitamins and herbal supplements 7 days before surgery.   Take these medicines the morning of surgery with A SIP OF WATER :  coreg , allegra if needed, flonase    DO NOT TAKE ANY ORAL DIABETIC MEDICATIONS DAY OF YOUR SURGERY  Bring CPAP mask and tubing day of surgery.                              You may not have any metal on  your body including hair pins, jewelry, and body piercing             Do not wear make-up, lotions, powders, perfumes/cologne, or deodorant  Do not wear nail polish including gel and S&S, artificial/acrylic nails, or any other type of covering on natural nails including finger and toenails. If you have artificial nails, gel coating, etc. that needs to be removed by a nail salon please have this removed prior to surgery or surgery may need to be canceled/ delayed if the surgeon/ anesthesia feels like they are unable to be safely monitored.   Do not shave  48 hours prior to surgery.               Men may shave face and neck.   Do not bring valuables to the hospital. [facility] IS NOT             RESPONSIBLE   FOR VALUABLES.   Contacts, glasses, dentures  or bridgework may not be worn into surgery.   Bring small overnight bag day of surgery.   DO NOT BRING YOUR HOME MEDICATIONS TO THE HOSPITAL. PHARMACY WILL DISPENSE MEDICATIONS LISTED ON YOUR MEDICATION LIST TO YOU DURING YOUR ADMISSION IN THE HOSPITAL!    Patients discharged on the day of surgery will not be allowed to drive home.  Someone NEEDS to stay with you for the first 24 hours after anesthesia.   Special Instructions: Bring a copy of your healthcare power of attorney and living will documents the day of surgery if you haven't scanned them before.              Please read over the following fact sheets you were given: IF YOU HAVE QUESTIONS ABOUT YOUR PRE-OP INSTRUCTIONS PLEASE CALL [phone]   If you received a COVID test during your pre-op visit  it is requested that you wear a mask when out in public, stay away from anyone that may not be feeling well and notify your surgeon if you develop symptoms. If you test positive for Covid or have been in contact with anyone that has tested positive in the last 10 days please notify you surgeon.    [facility] - Preparing for Surgery Before surgery, you can play an important role.   Because skin is not sterile, your skin needs to be as free of germs as possible.  You can reduce the number of germs on your skin by washing with CHG (chlorahexidine gluconate) soap before surgery.  CHG is an antiseptic cleaner which kills germs and bonds with the skin to continue killing germs even after washing. Please DO NOT use if you have an allergy to CHG or antibacterial soaps.  If your skin becomes reddened/irritated stop using the CHG and inform your nurse when you arrive at Short Stay. Do not shave (including legs and underarms) for at least 48 hours prior to the first CHG shower.  You may shave your face/neck. Please follow these instructions carefully:  1.  Shower with CHG Soap the night before surgery and the  morning of Surgery.  2.  If you choose to wash your hair, wash your hair first as usual with your  normal  shampoo.  3.  After you shampoo, rinse your hair and body thoroughly to remove the  shampoo.                           4.  Use CHG as you would any other liquid soap.  You can apply chg directly  to the skin and wash                       Gently with a scrungie or clean washcloth.  5.  Apply the CHG Soap to your body ONLY FROM THE NECK DOWN.   Do not use on face/ open                           Wound or open sores. Avoid contact with eyes, ears mouth and genitals (private parts).                       Wash face,  Genitals (private parts) with your normal soap.             6.  Wash thoroughly, paying special attention to the area where your surgery  will be performed.  7.  Thoroughly rinse your body with warm water  from the neck down.  8.  DO NOT shower/wash with your normal soap after using and rinsing off  the CHG Soap.                9.  Pat yourself dry with a clean towel.            10.  Wear clean pajamas.            11.  Place clean sheets on your bed the night of your first shower and do not  sleep with pets. Day of Surgery : Do not apply any lotions/deodorants the  morning of surgery.  Please wear clean clothes to the hospital/surgery center.  FAILURE TO FOLLOW THESE INSTRUCTIONS MAY RESULT IN THE CANCELLATION OF YOUR SURGERY PATIENT SIGNATURE_________________________________  NURSE SIGNATURE__________________________________  ________________________________________________________________________

## 2023-10-07 NOTE — Progress Notes (Signed)
 Anesthesia Review:  PCP: Kathlene Paradise LVO 07/01/23  Cardiologist : Andris Keels LVO 07/08/23   PPM/ ICD: Device Orders: Rep Notified:  Chest x-ray : 02/25/23- 2 view  EKG : 07/31/23  Echo : 09/30/23  EP Study 09/10/22  Stress test: Cardiac Cath :   Activity level:  Sleep Study/ CPAP : Fasting Blood Sugar :      / Checks Blood Sugar -- times a day:   DM- type  Hgba1c-    Blood Thinner/ Instructions /Last Dose: ASA / Instructions/ Last Dose :    Eliquis -    09/10/23- cystoscopy

## 2023-10-09 ENCOUNTER — Encounter (HOSPITAL_COMMUNITY): Payer: Self-pay

## 2023-10-09 ENCOUNTER — Other Ambulatory Visit: Payer: Self-pay

## 2023-10-09 ENCOUNTER — Encounter (HOSPITAL_COMMUNITY)
Admission: RE | Admit: 2023-10-09 | Discharge: 2023-10-09 | Disposition: A | Discharge status: Home / Self Care | Source: Ambulatory Visit | Attending: Urology | Admitting: Urology

## 2023-10-09 ENCOUNTER — Other Ambulatory Visit: Payer: Self-pay | Admitting: Internal Medicine

## 2023-10-09 ENCOUNTER — Encounter: Payer: Self-pay | Admitting: Internal Medicine

## 2023-10-09 VITALS — BP 138/69 | HR 64 | Temp 97.6°F | Resp 16 | Ht 69.0 in | Wt 165.0 lb

## 2023-10-09 DIAGNOSIS — R338 Other retention of urine: Secondary | ICD-10-CM | POA: Diagnosis not present

## 2023-10-09 DIAGNOSIS — Z955 Presence of coronary angioplasty implant and graft: Secondary | ICD-10-CM | POA: Diagnosis not present

## 2023-10-09 DIAGNOSIS — I4891 Unspecified atrial fibrillation: Secondary | ICD-10-CM | POA: Diagnosis not present

## 2023-10-09 DIAGNOSIS — I4819 Other persistent atrial fibrillation: Secondary | ICD-10-CM

## 2023-10-09 DIAGNOSIS — Z7901 Long term (current) use of anticoagulants: Secondary | ICD-10-CM | POA: Insufficient documentation

## 2023-10-09 DIAGNOSIS — I110 Hypertensive heart disease with heart failure: Secondary | ICD-10-CM | POA: Insufficient documentation

## 2023-10-09 DIAGNOSIS — I2510 Atherosclerotic heart disease of native coronary artery without angina pectoris: Secondary | ICD-10-CM | POA: Insufficient documentation

## 2023-10-09 DIAGNOSIS — C672 Malignant neoplasm of lateral wall of bladder: Secondary | ICD-10-CM | POA: Diagnosis not present

## 2023-10-09 DIAGNOSIS — Z8546 Personal history of malignant neoplasm of prostate: Secondary | ICD-10-CM | POA: Insufficient documentation

## 2023-10-09 DIAGNOSIS — I519 Heart disease, unspecified: Secondary | ICD-10-CM

## 2023-10-09 DIAGNOSIS — I509 Heart failure, unspecified: Secondary | ICD-10-CM | POA: Insufficient documentation

## 2023-10-09 DIAGNOSIS — R7303 Prediabetes: Secondary | ICD-10-CM | POA: Diagnosis not present

## 2023-10-09 DIAGNOSIS — C671 Malignant neoplasm of dome of bladder: Secondary | ICD-10-CM | POA: Diagnosis not present

## 2023-10-09 DIAGNOSIS — Z01812 Encounter for preprocedural laboratory examination: Secondary | ICD-10-CM | POA: Diagnosis not present

## 2023-10-09 DIAGNOSIS — Z87891 Personal history of nicotine dependence: Secondary | ICD-10-CM | POA: Insufficient documentation

## 2023-10-09 DIAGNOSIS — R310 Gross hematuria: Secondary | ICD-10-CM | POA: Diagnosis not present

## 2023-10-09 DIAGNOSIS — Z8673 Personal history of transient ischemic attack (TIA), and cerebral infarction without residual deficits: Secondary | ICD-10-CM | POA: Insufficient documentation

## 2023-10-09 DIAGNOSIS — G4733 Obstructive sleep apnea (adult) (pediatric): Secondary | ICD-10-CM | POA: Insufficient documentation

## 2023-10-09 DIAGNOSIS — Z01818 Encounter for other preprocedural examination: Secondary | ICD-10-CM

## 2023-10-09 DIAGNOSIS — I10 Essential (primary) hypertension: Secondary | ICD-10-CM

## 2023-10-09 LAB — BASIC METABOLIC PANEL WITH GFR
Anion gap: 8 (ref 5–15)
BUN: 17 mg/dL (ref 8–23)
CO2: 26 mmol/L (ref 22–32)
Calcium: 9.4 mg/dL (ref 8.9–10.3)
Chloride: 105 mmol/L (ref 98–111)
Creatinine, Ser: 0.99 mg/dL (ref 0.61–1.24)
GFR, Estimated: >60 mL/min (ref >60)
Glucose, Bld: 145 mg/dL — ABNORMAL HIGH (ref 70–99)
Potassium: 4.1 mmol/L (ref 3.5–5.1)
Sodium: 139 mmol/L (ref 135–145)

## 2023-10-09 LAB — CBC
HCT: 42.9 % (ref 39.0–52.0)
Hemoglobin: 13.6 g/dL (ref 13.0–17.0)
MCH: 26.4 pg (ref 26.0–34.0)
MCHC: 31.7 g/dL (ref 30.0–36.0)
MCV: 83.1 fL (ref 80.0–100.0)
Platelets: 162 10*3/uL (ref 150–400)
RBC: 5.16 MIL/uL (ref 4.22–5.81)
RDW: 16.3 % — ABNORMAL HIGH (ref 11.5–15.5)
WBC: 5.5 10*3/uL (ref 4.0–10.5)
nRBC: 0 % (ref 0.0–0.2)

## 2023-10-09 MED ORDER — CARVEDILOL 3.125 MG PO TABS: ORAL_TABLET | ORAL | 0 refills | Status: DC

## 2023-10-10 ENCOUNTER — Encounter (HOSPITAL_COMMUNITY): Payer: Self-pay

## 2023-10-10 ENCOUNTER — Other Ambulatory Visit: Payer: Self-pay | Admitting: Internal Medicine

## 2023-10-10 ENCOUNTER — Other Ambulatory Visit: Payer: Self-pay

## 2023-10-10 DIAGNOSIS — I2510 Atherosclerotic heart disease of native coronary artery without angina pectoris: Secondary | ICD-10-CM

## 2023-10-10 DIAGNOSIS — I4819 Other persistent atrial fibrillation: Secondary | ICD-10-CM

## 2023-10-10 DIAGNOSIS — I10 Essential (primary) hypertension: Secondary | ICD-10-CM

## 2023-10-10 DIAGNOSIS — I519 Heart disease, unspecified: Secondary | ICD-10-CM

## 2023-10-10 MED ORDER — CARVEDILOL 6.25 MG PO TABS: 6.2500 mg | ORAL_TABLET | Freq: Two times a day (BID) | ORAL | 0 refills | Status: DC

## 2023-10-10 NOTE — Anesthesia Preprocedure Evaluation (Addendum)
 Anesthesia Evaluation  Patient identified by MRN, date of birth, ID band Patient awake    Reviewed: Allergy & Precautions, NPO status , Patient's Chart, lab work & pertinent test results, reviewed documented beta blocker date and time   History of Anesthesia Complications Negative for: history of anesthetic complications  Airway Mallampati: II  TM Distance: >3 FB Neck ROM: Full    Dental  (+) Dental Advisory Given   Pulmonary sleep apnea (does not use CPAP) , COPD,  COPD inhaler, former smoker   breath sounds clear to auscultation       Cardiovascular hypertension, Pt. on medications and Pt. on home beta blockers (-) angina + CAD, + Cardiac Stents and +CHF (Entresto )  + dysrhythmias Atrial Fibrillation  Rhythm:Irregular Rate:Normal  09/30/2023 ECHO: EF 55 to 60%.  1. The LV has normal function, no regional wall motion abnormalities.   2. RVF is normal. The right ventricular size is normal.   3. The mitral valve is normal in structure. No evidence of mitral valve regurgitation. No evidence of mitral stenosis.   4. The aortic valve is tricuspid. Aortic valve regurgitation is not visualized. Aortic valve sclerosis is present, with no evidence of aortic valve stenosis.     Neuro/Psych  Headaches TIA   GI/Hepatic Neg liver ROS,GERD  Controlled,,  Endo/Other  diabetes (diet control)    Renal/GU Renal InsufficiencyRenal disease     Musculoskeletal  (+) Arthritis ,    Abdominal   Peds  Hematology Eliquis  Hb 13.6, plt 162k   Anesthesia Other Findings   Reproductive/Obstetrics                             Anesthesia Physical Anesthesia Plan  ASA: 3  Anesthesia Plan: General   Post-op Pain Management: Tylenol  PO (pre-op)* and Minimal or no pain anticipated   Induction: Intravenous  PONV Risk Score and Plan: 2 and Ondansetron  and Dexamethasone   Airway Management Planned: Oral ETT  Additional  Equipment: None  Intra-op Plan:   Post-operative Plan: Extubation in OR  Informed Consent: I have reviewed the patients History and Physical, chart, labs and discussed the procedure including the risks, benefits and alternatives for the proposed anesthesia with the patient or authorized representative who has indicated his/her understanding and acceptance.     Dental advisory given  Plan Discussed with: CRNA and Surgeon  Anesthesia Plan Comments: (See PAT note from 5/14)        Anesthesia Quick Evaluation

## 2023-10-10 NOTE — Telephone Encounter (Signed)
 This has been handled Via telephone call. Patient has been made aware and gave a verbal understanding.

## 2023-10-10 NOTE — Progress Notes (Signed)
 Case: [id number] Date/Time: 10/15/23 1315   Procedure: TURBT (TRANSURETHRAL RESECTION OF BLADDER TUMOR)   Anesthesia type: General   Diagnosis: Cancer of lateral wall of urinary bladder (HCC) [C67.2]   Pre-op diagnosis: BLADDER CANCER   Location: [facility] / [facility]   Surgeons: Homero Luster, MD       DISCUSSION: Dillon Young is an 89 yo male who presents to PAT prior to surgery above. PMH of former smoking, HTN, CAD s/p PCI (1994, 1995, 2017, 2019), CHF, A fib on Eliquis , carotid artery disease s/p R CEA (10/2011), OSA, hx of CVA, prediabetes, prostate cancer s/p TURP (2005, 2022) and XRT, bladder cancer  Pt had ED visit on 5/10 for urinary retention. Foley placed.  Patient had cystoscopy on 4/15. No complications noted.  Patient follows with Cardiology for CAD s/p multiple PCIs/stenting - last in 2019 and persistent A.fib on Eliquis . He is s/p multiple cardioversions - last was 08/2022. Still in rate controlled a.fib. Per notes patient has declined further intervention. Last seen on 3/5 in advanced heart failure clinic. He has a recovered EF by echo on 5/5. Noted to be euvolemic on exam. Entresto  dose decreased. 6 month f/u advised. Pt cleared for surgery in telephone encounter on 3/20:  "Chart reviewed as part of pre-operative protocol coverage. Given past medical history and time since last visit, based on ACC/AHA guidelines, Dillon Young would be at acceptable risk for the planned procedure without further cardiovascular testing.  Patient was advised that if he develops new symptoms prior to surgery to contact our office to arrange a follow-up appointment. He verbalized understanding. Per office protocol, patient can hold Eliquis  for 2 days prior to procedure."   LD Eliquis  4/15  VS: BP 138/69   Pulse 64   Temp 36.4 C (Oral)   Resp 16   Ht 5\' 9"  (1.753 m)   Wt 74.8 kg   SpO2 98%   BMI 24.37 kg/m   PROVIDERS: Arcadio Knuckles, MD   LABS: Labs reviewed: Acceptable  for surgery. (all labs ordered are listed, but only abnormal results are displayed)  Labs Reviewed  CBC - Abnormal; Notable for the following components:      Result Value   RDW 16.3 (*)    All other components within normal limits  BASIC METABOLIC PANEL WITH GFR - Abnormal; Notable for the following components:   Glucose, Bld 145 (*)    All other components within normal limits     IMAGES:   EKG 07/31/23:  Atrial fibrillation with premature ventricular or aberrantly conducted complexes, rate 67 Left axis deviation Inferior infarct , age undetermined  CV:  Echo 09/30/23:  IMPRESSIONS    1. Left ventricular ejection fraction, by estimation, is 55 to 60%. The left ventricle has normal function. The left ventricle has no regional wall motion abnormalities. Left ventricular diastolic function could not be evaluated.  2. Right ventricular systolic function is normal. The right ventricular size is normal.  3. The mitral valve is normal in structure. No evidence of mitral valve regurgitation. No evidence of mitral stenosis.  4. The aortic valve is tricuspid. Aortic valve regurgitation is not visualized. Aortic valve sclerosis is present, with no evidence of aortic valve stenosis.  LHC 12/26/2017:  Previously placed Prox RCA to Mid RCA stent (unknown type) is widely patent. Prox LAD to Mid LAD lesion is 80% stenosed. A stent was successfully placed. Post intervention, there is a 0% residual stenosis. There is severe left ventricular systolic dysfunction.  LV end diastolic pressure is mildly elevated. The left ventricular ejection fraction is less than 25% by visual estimate.  Past Medical History:  Diagnosis Date   Adenomatous colon polyp    Allergic rhinitis    Anemia    Bladder cancer (HCC)    currently on bcg treatments   BPH (benign prostatic hyperplasia)    CAD (coronary artery disease)    sees Dr. Lauro Portal    Carotid artery disease [facility])    CHF (congestive  heart failure) (HCC)    Chronic kidney disease    Sones. Prostate cancer   Clotting disorder (HCC)    CVA (cerebral infarction)    Diverticulitis    Dysrhythmia    atrial fib - hx of    Edema    recent swelling in feet and ankles    Hemorrhoids    History of kidney stones    HTN (hypertension)    Hx of radiation therapy    for prostate - 2013    Hyperlipidemia LDL goal <70    IBS (irritable bowel syndrome)    Low back pain    Pre-diabetes    Prostate cancer (HCC) 2008   seed implantation   Sleep apnea    sitting in recliner helps him to breathe per pt , never used cpap sleep study - pt reports could not go to sleep- 6 years ago    Smoker    Stroke [facility])    hx of mini strokes years ago     Past Surgical History:  Procedure Laterality Date   APPENDECTOMY     CARDIAC CATHETERIZATION N/A 01/16/2016   Procedure: Left Heart Cath and Coronary Angiography;  Surgeon: Lucendia Rusk, MD;  Location: [facility] INVASIVE CV LAB;  Service: Cardiovascular;  Laterality: N/A;   CARDIAC CATHETERIZATION N/A 01/16/2016   Procedure: Coronary Stent Intervention;  Surgeon: Lucendia Rusk, MD;  Location: [facility] INVASIVE CV LAB;  Service: Cardiovascular;  Laterality: N/A;   CARDIOVERSION N/A 11/26/2017   Procedure: CARDIOVERSION;  Surgeon: Hazle Lites, MD;  Location: [facility] ENDOSCOPY;  Service: Cardiovascular;  Laterality: N/A;   CARDIOVERSION N/A 03/13/2018   Procedure: CARDIOVERSION;  Surgeon: Hazle Lites, MD;  Location: [facility] ENDOSCOPY;  Service: Cardiovascular;  Laterality: N/A;   CARDIOVERSION N/A 07/31/2022   Procedure: CARDIOVERSION;  Surgeon: Lenise Quince, MD;  Location: [facility] ENDOSCOPY;  Service: Cardiovascular;  Laterality: N/A;   CARDIOVERSION N/A 09/10/2022   Procedure: CARDIOVERSION;  Surgeon: Elmyra Haggard, MD;  Location: [facility] INVASIVE CV LAB;  Service: Cardiovascular;  Laterality: N/A;   CATARACT EXTRACTION W/ INTRAOCULAR LENS  IMPLANT, BILATERAL Bilateral    COLONOSCOPY  10/21/2008    diverticulosis, radiation proctitis, internal hemorrhoids   CORONARY ANGIOPLASTY     CORONARY STENT INTERVENTION N/A 12/26/2017   Procedure: CORONARY STENT INTERVENTION;  Surgeon: Avanell Leigh, MD;  Location: [facility] INVASIVE CV LAB;  Service: Cardiovascular;  Laterality: N/A;   CYSTOSCOPY WITH BIOPSY N/A 09/10/2023   Procedure: CYSTOSCOPY, WITH BIOPSY/BILATERAL RETROGRADE;  Surgeon: Homero Luster, MD;  Location: [facility];  Service: Urology;  Laterality: N/A;  BILATERAL RETROGRADE   ENDARTERECTOMY  11/12/2011   Procedure: ENDARTERECTOMY CAROTID;  Surgeon: Mayo Speck, MD;  Location[facility] OR;  Service: Vascular;  Laterality: Right;   ESOPHAGOGASTRODUODENOSCOPY (EGD) WITH ESOPHAGEAL DILATION     HEMORRHOID BANDING     LEFT HEART CATH AND CORONARY ANGIOGRAPHY N/A 12/26/2017   Procedure: LEFT HEART CATH AND CORONARY ANGIOGRAPHY;  Surgeon: Avanell Leigh, MD;  Location:  [facility] INVASIVE CV LAB;  Service: Cardiovascular;  Laterality: N/A;   LITHOTRIPSY  X 3   LUMBAR LAMINECTOMY     TRANSURETHRAL RESECTION OF BLADDER TUMOR WITH MITOMYCIN -C Bilateral 01/06/2021   Procedure: TRANSURETHRAL RESECTION OF BLADDER TUMOR WITH GEMCITABINE  BILATERAL RETROGRADES;  Surgeon: Homero Luster, MD;  Location: [facility];  Service: Urology;  Laterality: Bilateral;   TRANSURETHRAL RESECTION OF PROSTATE  10/2003   TRANSURETHRAL RESECTION OF PROSTATE N/A 05/31/2020   Procedure: CYSTOSCOPY TRANSURETHRAL RESECTION BIOPSY AND FULGURATION OF PROSTATE LESION  (TURP);  Surgeon: Homero Luster, MD;  Location: [facility];  Service: Urology;  Laterality: N/A;    MEDICATIONS:  acetaminophen  (TYLENOL ) 500 MG tablet   ascorbic acid  (VITAMIN C ) 500 MG tablet   carvedilol  (COREG ) 3.125 MG tablet   carvedilol  (COREG ) 6.25 MG tablet   Cholecalciferol  (VITAMIN D -3) 25 MCG (1000 UT) CAPS   dicyclomine  (BENTYL ) 10 MG capsule   ELIQUIS  5 MG TABS tablet   fexofenadine (ALLEGRA) 180 MG tablet   Fluocinolone  Acetonide 0.01 % OIL   fluticasone  (FLONASE ) 50 MCG/ACT  nasal spray   gabapentin  (NEURONTIN ) 300 MG capsule   hydrALAZINE  (APRESOLINE ) 25 MG tablet   hydrocortisone  (ANUSOL -HC) 2.5 % rectal cream   Hypromellose (ARTIFICIAL TEARS OP)   ipratropium (ATROVENT ) 0.06 % nasal spray   melatonin 5 MG TABS   nitroGLYCERIN  (NITROSTAT ) 0.4 MG SL tablet   rosuvastatin  (CRESTOR ) 10 MG tablet   sacubitril -valsartan  (ENTRESTO ) 49-51 MG   sacubitril -valsartan  (ENTRESTO ) 97-103 MG   sodium chloride  (OCEAN) 0.65 % SOLN nasal spray   SPIRIVA  RESPIMAT 2.5 MCG/ACT AERS   spironolactone  (ALDACTONE ) 25 MG tablet   triamcinolone cream (KENALOG) 0.1 %   No current facility-administered medications for this encounter.   Antoinette Kirschner [facility] Surgical Short Stay/Anesthesiology [facility] Phone [phone] 10/10/2023 11:57 AM

## 2023-10-14 ENCOUNTER — Ambulatory Visit (HOSPITAL_COMMUNITY)
Admission: RE | Admit: 2023-10-14 | Discharge: 2023-10-14 | Disposition: A | Discharge status: Home / Self Care | Payer: PPO | Source: Ambulatory Visit | Attending: Cardiovascular Disease | Admitting: Cardiovascular Disease

## 2023-10-14 ENCOUNTER — Ambulatory Visit: Payer: Self-pay | Admitting: Cardiovascular Disease

## 2023-10-14 DIAGNOSIS — I6523 Occlusion and stenosis of bilateral carotid arteries: Secondary | ICD-10-CM | POA: Diagnosis not present

## 2023-10-14 DIAGNOSIS — I10 Essential (primary) hypertension: Secondary | ICD-10-CM | POA: Diagnosis not present

## 2023-10-14 DIAGNOSIS — E785 Hyperlipidemia, unspecified: Secondary | ICD-10-CM | POA: Diagnosis not present

## 2023-10-14 NOTE — H&P (Signed)
 I have bladder cancer.  HPI: Dillon Young is a 89 year-old male established patient who is here for bladder cancer.  10/14/23: Dillon Young is scheduled for a restaging TURBT after his recent biopsies showed a T1 HG NMIBC.   He has had intermittent hematuria since the initial biopsy and is currently holding the Eliquis .   07/31/23: Dillon Young returns today for cystoscopy. His last BT was a Ta HG NMIBC in 8/22 and he had maintenance BCG in 9.24 He has a history of prostate cancer with remote radiation therapy with radiation cystitis. His UA is clear.   01/16/23: Dillon Young returns today for surveillance cystoscopy. His UA is clear. He will need BCG maintenance if the cystoscopy is negative. He continues to have intermittent hematuria that seems to occur when he is constipated but his UA is clear today.   10/24/22: Dillon Young returns today for surveillance cystoscopy. He last had maintenance BCG on 08/01/22. His UA has 3-10 RBC's which is chronic. He has occasional gross hematuria when he gets constipated and has to strain. His IPSS is 11. He has nocturia x3 and a reduced stream. He has had no flank pain or associated signs or symptoms.   07/04/22: Dillon Young returns today for surveillance cystoscopy. He had some transient painless hematuria last weekend. He last had a CT hematuria study in 7/23 and had a 7mm renal stone but he has no flank pain. His last BT was a Ta HG NMIBC in 8/22 and he had maintenance BCG in 7/23. He has a history of prostate cancer with remote radiation therapy with radiation cystitis. His last PSA was undetectible. He has chronic incontinence. He has had a prior TURP. His IPSS is 8 with nocturia x 2.   03/28/22: Dillon Young returns today in f/u for cystoscopy for his history of T1 HG NMIBC on initial resection in 4/22 and Ta HG NMIBC for a recurrence in 8/22. He had BCG x 3 after his last visit and also a CT hematuria study that showed a 7mm LLP stone but no other significant GU findings. He has a history of  prostate cancer s/p seeds remotely with an undetectible PSA at last check in 7/22. His IPSS is 9 with nocturia x 4.   12/06/21: Dillon Young returns today for cystoscopy. He had a episode of hematuria on Sunday. He had no dysuria. He has stable mild pyuria and bacteria. he has had no flank pain.   08/28/21: Dillon Young returns today for cystosocpy for his history below. He has his last BCG maintenance on 07/19/21. He has had no hematuria. His IPSS is 15 which his improved. UA is clear.   06/13/21: Dillon Young returns today in f/u for cystoscopy for his history of T1 HG NMIBC on initial resection in 4/22 and Ta HG NMIBC for a recurrence in 8/22. He has had induction BCG. He is doing well without hematuria.   04/14/21: Dillon Young returns today in f/u for cystoscopy. He completed BCG induction on 10/4 and tolerated that well. He has had no recent hematuria or dysuria. HE continue to have incontinence.   01/13/21: Dillon Young returns to review the pathology from his recent TURBT. He is doing well without further hematuria back on eliquis . His IPSS is 14 with a reduced stream and nocturia. He was found to have two 5-39mm papillary lesions on the dome and posterior wall that were Ta HG NMIBC. His previous tumor was T1. He has a history of prostate cancer with prior radiation and a PSA on 7/27 of <0.015. He quit  smoking in 2010.   12/21/20: Dillon Young returns for the history noted below. he had a single episode of minimal hematuria about a month ago. He is otherwise doing well but has some mild incontinence requiring a pad. His IPSS is 10 with nocturia x 3. He has been seen by PT for the incontinence and is doing better.   09/21/20: Dillon Young returns today in f/u. He completed BCG on 08/01/20 for his history of T1 HG NMIBC. He has had no hematuria but has some urgency with UUI and SUI requiring pads since the TURBT. He has no dysuria. He has a history of radiation cystitis from seeds for prostate cancer remotely. The last PSA was 0.0 on  07/10/19.   08/17/20: Patient with below noted past medical history. He presents today due to worsening urinary leakage. He reports that he has some control but is constantly dribbling. He has needed to wear a pad due to this. this has progressively worsesned since his surgery and undergoing BCG. He denies fevers, chills, gross hematuria. He has tolerated the BCG treatments well and has finished 6 rounds. He does complain of some pain in his pelvic area that radiates down. He feels he is emptying his bladder when voiding, but is unsure due to leakage. He also recently noticed a small spot on the glans of his penis that has not been present before. He reports that it is painless. He has not noticed any discharge, or drainage from the area.    Dillon Young returns today in f/u from a recent TURBT of a lesion at the anterior bladder neck and prostatic urethra that proved to be a T1 HG NMIBC that I felt was well resected. He is having some postop frequency and urgency with small voids and some stress incontinence. He had no incontinence before hand. He has some initial hematuria. He has no other associated signs or symptoms.     CC: AUA Questions Scoring.  HPI:     AUA Symptom Score: Less than 20% of the time he has the sensation of not emptying his bladder completely when finished urinating. Less than 20% of the time he has to urinate again fewer than two hours after he has finished urinating. Less than 20% of the time he has to start and stop again several times when he urinates. He never finds it difficult to postpone urination. 50% of the time he has a weak urinary stream. Less than 50% of the time he has to push or strain to begin urination. He has to get up to urinate 3 times from the time he goes to bed until the time he gets up in the morning.   Calculated AUA Symptom Score: 11    ALLERGIES: No Known Drug Allergies    MEDICATIONS: Amlodipine  Besylate  Cholecalciferol   Clobetasol Propionate   Dofetilide   Eliquis  5 mg tablet  Fexofenadine Hcl  Fluticasone  Propionate  Ipratropium Bromide   Iron  Nitroglycerin   Rosuvastatin  Calcium   Spironolactone   Triamcinolone  Valsartan   Vitamin C   Vitamin D2     Notes: Pt forgot his medication list today. Denies changes from previous assessment.   GU PSH: Bladder Instill AntiCA Agent - 02/18/2023, 02/11/2023, 02/04/2023, 08/01/2022, 07/25/2022, 07/19/2022, 01/05/2022, 12/26/2021, 12/19/2021, 2023, 2023, 2023, 02/28/2021, 02/22/2021, 02/15/2021, 02/08/2021, 01/31/2021, 2022, 2022, 2022, 2022, 2022, 2022, 2022 Cystoscopy - 01/16/2023, 10/24/2022, 07/04/2022, 03/28/2022, 12/06/2021, 08/28/2021, 2023, 04/14/2021, 2022, 2022, 2021, 2019 Cystoscopy Retrogrades - 2022 Cystoscopy TURBT <2 cm - 2022, 2022 Cystoscopy Ureteroscopy - 2007 ESWL - 2007 Locm  300-399Mg /Ml Iodine,1Ml - 12/15/2021, 2019 TRANSPERI NEEDLE PLACE, PROS - 2009       PSH Notes: Colonoscopy (Fiberoptic), Surgery Prostate Transperineal Placement Of Needles, Cystoscopy With Ureteroscopy, Lithotripsy, Cath Stent Placement, Back Surgery   NON-GU PSH: Cardiac Stent Placement Coronary Artery Bypass Grafting - 2017 Diagnostic Colonoscopy - 2017 Visit Complexity (formerly GPC1X) - 07/04/2022     GU PMH: Bladder Cancer overlapping sites - 02/18/2023, - 02/04/2023, - 08/01/2022, - 07/25/2022, - 07/19/2022, - 01/05/2022, - 01/02/2022, - 12/26/2021, - 12/19/2021, - 12/15/2021, - 2023, - 2023, - 2023, - 2023, - 02/28/2021, - 02/22/2021, - 02/15/2021, - 02/08/2021, - 01/31/2021, - 2022, - 2022 Bladder Cancer Anterior - 02/11/2023, - 2022, - 2022, - 2022, - 2022, - 2022, - 2022, He has a T1 HG NMIBC that was well resected from the anterior bladder neck and prostatic urethra. I discussed the significance of this finding and the recommendation for BCG induction and maintenance. I have reviewed the side effects of BCG therapy in detail and will get him set up to return in 2 weeks to initiate treatment, with the delay because of some  residual hematuria. He will then see me in 3 months for a cytology prior to f/u cystoscopy. , - 2022 Bladder Stone - 01/16/2023, I will reassess at f/u. I don't think the prostatic stone needs removal at this time. , - 10/24/2022, 3mm in prostatic urethra. , - 2019 Gross hematuria (Stable), He continues to have intermittent bleeding that appears to be secondary to the stone and radiation changes. - 01/16/2023, (Stable), He has not had upper tract studies for about 2 years so with the hematuria, I will get a CT hematuria study. BUN/Cr today. , - 12/06/2021, Improving. , - 2022, He has chronic intermittent hematuria and now has a lesion on the anterior prostate that is of concern for a neoplasm. I will get him set up for a TUR biopsy and fulguration. Risks of bleeding, infection, incontinence, strictures, thrombotic events and anesthetic complications reviewed. Cytology sent today. , - 2021, He continues to have gross hematuria and may have had some improvement with a reduction in the Eliquis  dosage. I am going to have him return for cystoscopy to reassess the degree of radiation cystitis and to r/o tumors. , - 2021, - 2021, Stable renal calculi, bilateral renal cysts without obstrutive signs on u/s today. The bladder was not visualized., - 2020 (Worsening), He has bleeding that is probably from the radiation cystitis and is facilitated by Eliquis  and Plavix . I will have stop the plavix  per a communciation with Dr. Harlie Lieu and he will hold the Eliquis  for a few days. He will return in 2 weeks. If he has persistent bleeding, he will need cystoscopy and fulguration and possible hyperbaric O2 therapy., - 2020 (Improving), - 2019, I am going to get him set up for CT hematuria study and cystoscopy. I will get a BUN/Cr today. If the bleeding becomes heavy, I will have him hold the plavix . The bleeding could be from his stone or radiation cystitis but I need to r/o neoplasm as well. , - 2019, Gross hematuria, - 2015, Gross  Hematuria, - 2014 History of bladder cancer, No recurrences noted. He will return in 1-2 weeks to start a 3 week course of BCG. He will then return in 6 months for cystoscopy. - 01/16/2023, No recurrences. F/u in 3 months for cystoscopy and then BCG x 3. , - 10/24/2022, He has no recurrent tumors. He will return for maintenance BCG x  3 weeks and then cysto in 3 months. , - 07/04/2022, There is a small lesion on the right dome that is indeterminant. I will have him return in 2 weeks for a cytology and repeat cystoscopy in 85months. , - 03/28/2022, NO recurrent bladder tumors are seen. He will return for 3 weeks of maintenance BCG and the 3 months for cystoscopy. , - 12/06/2021, There are some subtle bladder wall lesions. I will get a cytology and biopsy if positive. If negative I will just repeat cystoscopy in 85mo. , - 08/28/2021, No recurrences seen. Cytology sent. f/u for maintenance BCG x 3 and then cysto in 3 months. , - 2023, He has no recurrent tumors. He will return in 3 months for cystoscopy and will have subsequent BCG maintanence. , - 04/14/2021, He did well with the BCG but has a small recurrence on the right posterior wall about 5mm in size. I will get him set up to go to the OR for cystoscopy with TURBT and to reassess the prior resection bed in the prostate. I will instill gemcitabine . I reviewed the risks of bleeding, infection, injury to the bladder and adjacent structures, need for secondary procedures, urine leaks, thrombotic events and anesthetic complications. , - 2022, No recurrent tumors are seen. He has some delayed healing in the prostatic urethra that is probably contributing to his urgency and the UA changes. He will return in 3 months for cystoscopy and possible BCG maintenance. , - 2022 Mixed incontinence - 01/16/2023, His prostate is widely patent and he empties well. , - 2022, He has some sphincter damage that is probably a combination of radiation effect and instrumentation. I will have him see  PT. , - 2022 Nocturia, I will try him on Gemtesa and gave him 2 weeks of samples to try. - 01/16/2023, He has nocturia x 4. I recommended cutting out coffee and bladder irritants and if that doesn't work we can consider medications. , - 2021 (Stable), - 2021 (Stable), - 2018, Nocturia, - 2017 Radiation cystitis (with hematuria) - 01/16/2023, He has some intermittent hematuria that is probably secondary to the radiation changes and prostatic stone. , - 10/24/2022 (Stable), He had an episode of hematuria that is most likely from the radiation changes. If it recurs I will repeat the CT but he had only a stone on CT last summer. , - 07/04/2022, He has radiation changes in the prostatic urethra with some anterior dystrophic calcification. This could be the cause of the hematuria. , - 12/06/2021, - 08/28/2021, - 2021, The bleeding is most likely from the radiation change in the prostate with the associated stone in the prostatic urethral. He passed that after cystoscopy., - 2019 Microscopic hematuria - 10/24/2022 History of prostate cancer, He has no concerning symptoms - 07/04/2022, - 03/28/2022, His last PSA was undetectible., - 08/28/2021, His PSA remains undetectible. , - 2022, I will get a PSA as he is overdue. , - 2022, - 2022, His PSA was undetectible at last check and there was no prostate cancer on the biopsy. , - 2022, His PSA remained undetectible earlier this year. , - 2021, History of prostate cancer, - 2017 Stress Incontinence, He has stable incontinence with an incompetent sphincter. - 07/04/2022, - 2022 Continuous incontinence - 03/28/2022, He has radiation effect at the sphincter that is the cause of the incontinence. , - 04/14/2021, - 2022, - 2022, - 2022 Renal calculus, He has a 7mm LLP stone that could be associated with the microhematuria. No treatment  at this time. - 03/28/2022, - 08/28/2021, He has a left renal stone but no symptoms., - 2021, - 2021, Nephrolithiasis, - 2017 Bladder Cancer Dome - 2022 Bladder  Cancer Posterior, He had two small Ta HG recurrences following initial induction BCG after TURBT for T1 disease. He is doing well. I will get him set up to return for a second induction course of BCG and then do cystoscopy in about 3-4 months. - 2022, - 2022 Dysuria - 2022 Urinary Frequency (Stable) - 2022, Urinary frequency, - 2014 Urinary Urgency - 2022 Prostate, Neoplasm of uncertain behavior - 2021 Renal cyst - 2021, Renal cysts, acquired, bilateral, - 2014 Prostate Cancer - 2018 LLQ pain, Abdominal pain, LLQ (left lower quadrant) - 2017 ED due to arterial insufficiency, Erectile dysfunction due to arterial insufficiency - 2016 Elevated PSA, PSA,Elevated - 2014 Urinary Retention, Unspec, Acute Urinary Retention - 2014      PMH Notes:  2006-05-24 16:43:03 - Note: Coronary Artery Disease  2012-07-24 13:53:37 - Note: Bladder Calculus   NON-GU PMH: Muscle weakness (generalized) - 2022, - 2022 Other specified disorders of muscle - 2022, - 2022 Bacteriuria, I covered him with cipro for the cysto and will check a culture but this is probably from the delayed healing in the prostate. - 2022 Encounter for general adult medical examination without abnormal findings, Encounter for preventive health examination - 2017 Nausea, Nausea - 2017 Personal history of other diseases of the digestive system, History of diverticulitis of colon - 2017 Personal history of other diseases of the circulatory system, History of cardiac disorder - 2014, History of hypertension, - 2014 Personal history of other endocrine, nutritional and metabolic disease, History of hypercholesterolemia - 2014 Atrial Fibrillation    FAMILY HISTORY: Family Health Status Number - Father Urologic Disorder - Father   SOCIAL HISTORY: Marital Status: Married Preferred Language: English; Ethnicity: Not Hispanic Or Latino; Race: White Current Smoking Status: Patient does not smoke anymore. Has not smoked since 09/13/2008.   Tobacco  Use Assessment Completed: Used Tobacco in last 30 days? Social Drinker.  Drinks 1 caffeinated drink per day.     Notes: Former smoker, Tobacco Use, Marital History - Currently Married, Caffeine Use, Death In The Family Mother, Death In The Family Father, Occupation:   REVIEW OF SYSTEMS:    GU Review Male:   Patient reports get up at night to urinate. Patient denies frequent urination, hard to postpone urination, burning/ pain with urination, leakage of urine, stream starts and stops, trouble starting your stream, have to strain to urinate , erection problems, and penile pain.  Gastrointestinal (Upper):   Patient denies nausea, vomiting, and indigestion/ heartburn.  Gastrointestinal (Lower):   Patient denies diarrhea and constipation.  Constitutional:   Patient denies fatigue, weight loss, night sweats, and fever.  Skin:   Patient denies skin rash/ lesion and itching.  Eyes:   Patient denies blurred vision and double vision.  Ears/ Nose/ Throat:   Patient denies sore throat and sinus problems.  Hematologic/Lymphatic:   Patient denies swollen glands and easy bruising.  Cardiovascular:   Patient denies leg swelling and chest pains.  Respiratory:   Patient denies cough and shortness of breath.  Endocrine:   Patient denies excessive thirst.  Musculoskeletal:   Patient denies back pain and joint pain.  Neurological:   Patient denies headaches and dizziness.  Psychologic:   Patient denies depression and anxiety.   VITAL SIGNS: There were no vitals taken for this visit. Gen: WD, WN in NAD Lungs:  CTA. CV: RRR.  Complexity of Data:   12/21/20 07/10/19 09/09/17 09/04/16 08/27/15 08/17/14 07/15/13 06/25/12  PSA  Total PSA <0.015 ng/mL 0.0 ng/ml <0.015 ng/mL < 0.015 ng/dl <2.95  6.21  3.08  6.57     PROCEDURES:           Urinalysis Dipstick Dipstick Cont'd  Color: Yellow Bilirubin: Neg mg/dL  Appearance: Clear Ketones: Neg mg/dL  Specific Gravity: 8.469 Blood: Neg ery/uL  pH: 6.5 Protein:  Neg mg/dL  Glucose: Neg mg/dL Urobilinogen: 0.2 mg/dL    Nitrites: Neg    Leukocyte Esterase: Neg leu/uL    ASSESSMENT:      ICD-10 Details  1 GU:   T1 NMIBC.  He is to undergo a restaging TURBT.   2   Radiation cystitis (with hematuria) - N30.41 Minor - HE has stable radiation changes in the bladder.      PLAN:      TURBT         Document Letter(s):  Created for Patient: Clinical Summary         Notes:   CC: Dr. Sandra Crouch

## 2023-10-15 ENCOUNTER — Ambulatory Visit (HOSPITAL_COMMUNITY)
Admission: RE | Admit: 2023-10-15 | Discharge: 2023-10-15 | Disposition: A | Discharge status: Home / Self Care | Source: Ambulatory Visit | Attending: Urology | Admitting: Urology

## 2023-10-15 ENCOUNTER — Encounter (HOSPITAL_COMMUNITY)
Admission: RE | Disposition: A | Discharge status: Home / Self Care | Payer: Self-pay | Source: Ambulatory Visit | Attending: Urology

## 2023-10-15 ENCOUNTER — Ambulatory Visit (HOSPITAL_COMMUNITY): Payer: Self-pay | Admitting: Medical

## 2023-10-15 ENCOUNTER — Ambulatory Visit (HOSPITAL_BASED_OUTPATIENT_CLINIC_OR_DEPARTMENT_OTHER): Payer: Self-pay | Admitting: Certified Registered"

## 2023-10-15 ENCOUNTER — Encounter (HOSPITAL_COMMUNITY): Payer: Self-pay | Admitting: Urology

## 2023-10-15 DIAGNOSIS — Z8546 Personal history of malignant neoplasm of prostate: Secondary | ICD-10-CM | POA: Insufficient documentation

## 2023-10-15 DIAGNOSIS — N189 Chronic kidney disease, unspecified: Secondary | ICD-10-CM | POA: Diagnosis not present

## 2023-10-15 DIAGNOSIS — C672 Malignant neoplasm of lateral wall of bladder: Secondary | ICD-10-CM | POA: Insufficient documentation

## 2023-10-15 DIAGNOSIS — D494 Neoplasm of unspecified behavior of bladder: Secondary | ICD-10-CM | POA: Diagnosis not present

## 2023-10-15 DIAGNOSIS — Z923 Personal history of irradiation: Secondary | ICD-10-CM | POA: Insufficient documentation

## 2023-10-15 DIAGNOSIS — Z7901 Long term (current) use of anticoagulants: Secondary | ICD-10-CM | POA: Insufficient documentation

## 2023-10-15 DIAGNOSIS — I10 Essential (primary) hypertension: Secondary | ICD-10-CM | POA: Diagnosis not present

## 2023-10-15 DIAGNOSIS — N393 Stress incontinence (female) (male): Secondary | ICD-10-CM | POA: Diagnosis not present

## 2023-10-15 DIAGNOSIS — Z87891 Personal history of nicotine dependence: Secondary | ICD-10-CM | POA: Insufficient documentation

## 2023-10-15 DIAGNOSIS — Z01818 Encounter for other preprocedural examination: Secondary | ICD-10-CM

## 2023-10-15 DIAGNOSIS — N3020 Other chronic cystitis without hematuria: Secondary | ICD-10-CM | POA: Diagnosis not present

## 2023-10-15 DIAGNOSIS — I509 Heart failure, unspecified: Secondary | ICD-10-CM | POA: Diagnosis not present

## 2023-10-15 DIAGNOSIS — I2510 Atherosclerotic heart disease of native coronary artery without angina pectoris: Secondary | ICD-10-CM | POA: Diagnosis not present

## 2023-10-15 DIAGNOSIS — J449 Chronic obstructive pulmonary disease, unspecified: Secondary | ICD-10-CM | POA: Insufficient documentation

## 2023-10-15 DIAGNOSIS — I130 Hypertensive heart and chronic kidney disease with heart failure and stage 1 through stage 4 chronic kidney disease, or unspecified chronic kidney disease: Secondary | ICD-10-CM | POA: Diagnosis not present

## 2023-10-15 DIAGNOSIS — N3041 Irradiation cystitis with hematuria: Secondary | ICD-10-CM | POA: Diagnosis not present

## 2023-10-15 DIAGNOSIS — D090 Carcinoma in situ of bladder: Secondary | ICD-10-CM | POA: Diagnosis not present

## 2023-10-15 HISTORY — PX: TRANSURETHRAL RESECTION OF BLADDER TUMOR: SHX2575

## 2023-10-15 LAB — GLUCOSE, CAPILLARY: Glucose-Capillary: 113 mg/dL — ABNORMAL HIGH (ref 70–99)

## 2023-10-15 SURGERY — TURBT (TRANSURETHRAL RESECTION OF BLADDER TUMOR)
Anesthesia: General

## 2023-10-15 MED ORDER — ROCURONIUM BROMIDE 10 MG/ML (PF) SYRINGE
PREFILLED_SYRINGE | INTRAVENOUS | Status: AC
  Filled 2023-10-15: qty 10

## 2023-10-15 MED ORDER — SODIUM CHLORIDE 0.9% FLUSH: 3.0000 mL | INTRAVENOUS | Status: DC | PRN

## 2023-10-15 MED ORDER — ONDANSETRON HCL 4 MG/2ML IJ SOLN
INTRAMUSCULAR | Status: AC
  Filled 2023-10-15: qty 2

## 2023-10-15 MED ORDER — SODIUM CHLORIDE 0.9% FLUSH: 3.0000 mL | Freq: Two times a day (BID) | INTRAVENOUS | Status: DC

## 2023-10-15 MED ORDER — ACETAMINOPHEN 500 MG PO TABS
1000.0000 mg | ORAL_TABLET | Freq: Once | ORAL | Status: AC
  Administered 2023-10-15: 1000 mg via ORAL
  Filled 2023-10-15: qty 2

## 2023-10-15 MED ORDER — STERILE WATER FOR INJECTION IJ SOLN
INTRAMUSCULAR | Status: DC | PRN
Start: 2023-10-15 — End: 2023-10-15
  Administered 2023-10-15: 500 mL

## 2023-10-15 MED ORDER — OXYCODONE HCL 5 MG/5ML PO SOLN: 5.0000 mg | Freq: Once | ORAL | Status: AC | PRN

## 2023-10-15 MED ORDER — ONDANSETRON HCL 4 MG/2ML IJ SOLN
INTRAMUSCULAR | Status: DC | PRN
  Administered 2023-10-15: 4 mg via INTRAVENOUS

## 2023-10-15 MED ORDER — ORAL CARE MOUTH RINSE: 15.0000 mL | Freq: Once | OROMUCOSAL | Status: AC

## 2023-10-15 MED ORDER — DEXAMETHASONE SODIUM PHOSPHATE 10 MG/ML IJ SOLN
INTRAMUSCULAR | Status: AC
  Filled 2023-10-15: qty 1

## 2023-10-15 MED ORDER — SODIUM CHLORIDE 0.9 % IV SOLN: 250.0000 mL | INTRAVENOUS | Status: DC | PRN

## 2023-10-15 MED ORDER — 0.9 % SODIUM CHLORIDE (POUR BTL) OPTIME
TOPICAL | Status: DC | PRN
Start: 2023-10-15 — End: 2023-10-15
  Administered 2023-10-15: 1000 mL

## 2023-10-15 MED ORDER — DEXAMETHASONE SODIUM PHOSPHATE 10 MG/ML IJ SOLN
INTRAMUSCULAR | Status: DC | PRN
  Administered 2023-10-15: 4 mg via INTRAVENOUS

## 2023-10-15 MED ORDER — LIDOCAINE HCL (PF) 2 % IJ SOLN
INTRAMUSCULAR | Status: AC
  Filled 2023-10-15: qty 5

## 2023-10-15 MED ORDER — FENTANYL CITRATE (PF) 100 MCG/2ML IJ SOLN
INTRAMUSCULAR | Status: AC
  Filled 2023-10-15: qty 2

## 2023-10-15 MED ORDER — MIDAZOLAM HCL 2 MG/2ML IJ SOLN: 0.5000 mg | Freq: Once | INTRAMUSCULAR | Status: DC | PRN

## 2023-10-15 MED ORDER — MEPERIDINE HCL 50 MG/ML IJ SOLN: 6.2500 mg | INTRAMUSCULAR | Status: DC | PRN

## 2023-10-15 MED ORDER — CEFAZOLIN SODIUM-DEXTROSE 2-4 GM/100ML-% IV SOLN
2.0000 g | INTRAVENOUS | Status: AC
  Administered 2023-10-15: 2 g via INTRAVENOUS
  Filled 2023-10-15: qty 100

## 2023-10-15 MED ORDER — FENTANYL CITRATE (PF) 100 MCG/2ML IJ SOLN
INTRAMUSCULAR | Status: DC | PRN
Start: 2023-10-15 — End: 2023-10-15
  Administered 2023-10-15 (×2): 25 ug via INTRAVENOUS

## 2023-10-15 MED ORDER — PROPOFOL 500 MG/50ML IV EMUL
INTRAVENOUS | Status: AC
  Filled 2023-10-15: qty 50

## 2023-10-15 MED ORDER — SODIUM CHLORIDE 0.9 % IR SOLN
Status: DC | PRN
  Administered 2023-10-15: 3000 mL via INTRAVESICAL

## 2023-10-15 MED ORDER — OXYCODONE HCL 5 MG PO TABS
5.0000 mg | ORAL_TABLET | Freq: Once | ORAL | Status: AC | PRN
  Administered 2023-10-15: 5 mg via ORAL

## 2023-10-15 MED ORDER — OXYCODONE-ACETAMINOPHEN 5-325 MG PO TABS: 1.0000 | ORAL_TABLET | Freq: Four times a day (QID) | ORAL | 0 refills | Status: DC | PRN

## 2023-10-15 MED ORDER — ROCURONIUM BROMIDE 10 MG/ML (PF) SYRINGE
PREFILLED_SYRINGE | INTRAVENOUS | Status: DC | PRN
  Administered 2023-10-15: 60 mg via INTRAVENOUS

## 2023-10-15 MED ORDER — ACETAMINOPHEN 325 MG PO TABS: 650.0000 mg | ORAL_TABLET | ORAL | Status: DC | PRN

## 2023-10-15 MED ORDER — PHENYLEPHRINE 80 MCG/ML (10ML) SYRINGE FOR IV PUSH (FOR BLOOD PRESSURE SUPPORT)
PREFILLED_SYRINGE | INTRAVENOUS | Status: AC
  Filled 2023-10-15: qty 10

## 2023-10-15 MED ORDER — LIDOCAINE HCL (PF) 2 % IJ SOLN
INTRAMUSCULAR | Status: DC | PRN
Start: 2023-10-15 — End: 2023-10-15
  Administered 2023-10-15: 20 mg via INTRADERMAL

## 2023-10-15 MED ORDER — LACTATED RINGERS IV SOLN: INTRAVENOUS | Status: DC

## 2023-10-15 MED ORDER — OXYCODONE HCL 5 MG PO TABS
ORAL_TABLET | ORAL | Status: AC
  Filled 2023-10-15: qty 1

## 2023-10-15 MED ORDER — ACETAMINOPHEN 650 MG RE SUPP: 650.0000 mg | RECTAL | Status: DC | PRN

## 2023-10-15 MED ORDER — PROPOFOL 10 MG/ML IV BOLUS
INTRAVENOUS | Status: DC | PRN
  Administered 2023-10-15: 100 mg via INTRAVENOUS

## 2023-10-15 MED ORDER — SUGAMMADEX SODIUM 200 MG/2ML IV SOLN
INTRAVENOUS | Status: DC | PRN
  Administered 2023-10-15: 200 mg via INTRAVENOUS

## 2023-10-15 MED ORDER — CEFDINIR 300 MG PO CAPS: 300.0000 mg | ORAL_CAPSULE | Freq: Two times a day (BID) | ORAL | 0 refills | Status: DC

## 2023-10-15 MED ORDER — PHENYLEPHRINE 80 MCG/ML (10ML) SYRINGE FOR IV PUSH (FOR BLOOD PRESSURE SUPPORT)
PREFILLED_SYRINGE | INTRAVENOUS | Status: DC | PRN
  Administered 2023-10-15 (×5): 80 ug via INTRAVENOUS

## 2023-10-15 MED ORDER — MORPHINE SULFATE (PF) 2 MG/ML IV SOLN: 2.0000 mg | INTRAVENOUS | Status: DC | PRN

## 2023-10-15 MED ORDER — FENTANYL CITRATE PF 50 MCG/ML IJ SOSY
PREFILLED_SYRINGE | INTRAMUSCULAR | Status: AC
  Filled 2023-10-15: qty 2

## 2023-10-15 MED ORDER — CHLORHEXIDINE GLUCONATE 0.12 % MT SOLN
15.0000 mL | Freq: Once | OROMUCOSAL | Status: AC
  Administered 2023-10-15: 15 mL via OROMUCOSAL

## 2023-10-15 MED ORDER — OXYCODONE HCL 5 MG PO TABS: 5.0000 mg | ORAL_TABLET | ORAL | Status: DC | PRN

## 2023-10-15 MED ORDER — FENTANYL CITRATE PF 50 MCG/ML IJ SOSY
25.0000 ug | PREFILLED_SYRINGE | INTRAMUSCULAR | Status: DC | PRN
  Administered 2023-10-15 (×2): 50 ug via INTRAVENOUS

## 2023-10-15 SURGICAL SUPPLY — 18 items
BAG URINE DRAIN 2000ML AR STRL (UROLOGICAL SUPPLIES) IMPLANT
BAG URO CATCHER STRL LF (MISCELLANEOUS) ×1 IMPLANT
CATH FOLEY 3WAY 30CC 22FR (CATHETERS) IMPLANT
CATH URETL OPEN 5X70 (CATHETERS) IMPLANT
DRAPE FOOT SWITCH (DRAPES) ×1 IMPLANT
ELECT REM PT RETURN 15FT ADLT (MISCELLANEOUS) ×1 IMPLANT
GLOVE SURG SS PI 8.0 STRL IVOR (GLOVE) ×1 IMPLANT
GOWN STRL SURGICAL XL XLNG (GOWN DISPOSABLE) ×1 IMPLANT
HOLDER FOLEY CATH W/STRAP (MISCELLANEOUS) IMPLANT
KIT TURNOVER KIT A (KITS) IMPLANT
LOOP CUT BIPOLAR 24F LRG (ELECTROSURGICAL) IMPLANT
MANIFOLD NEPTUNE II (INSTRUMENTS) ×1 IMPLANT
PACK CYSTO (CUSTOM PROCEDURE TRAY) ×1 IMPLANT
SUT ETHILON 3 0 PS 1 (SUTURE) IMPLANT
SYR 30ML LL (SYRINGE) IMPLANT
SYRINGE TOOMEY IRRIG 70ML (MISCELLANEOUS) IMPLANT
TUBING CONNECTING 10 (TUBING) ×1 IMPLANT
TUBING UROLOGY SET (TUBING) ×1 IMPLANT

## 2023-10-15 NOTE — Anesthesia Procedure Notes (Addendum)
 Procedure Name: Intubation Date/Time: 10/15/2023 1:54 PM  Performed by: Alwyn Juba, CRNAPre-anesthesia Checklist: Patient identified, Emergency Drugs available, Suction available, Patient being monitored and Timeout performed Patient Re-evaluated:Patient Re-evaluated prior to induction Oxygen Delivery Method: Circle system utilized Preoxygenation: Pre-oxygenation with 100% oxygen Induction Type: IV induction Ventilation: Mask ventilation without difficulty Laryngoscope Size: Miller and 2 Grade View: Grade I Tube type: Oral Tube size: 7.5 mm Number of attempts: 1 Airway Equipment and Method: Stylet Placement Confirmation: ETT inserted through vocal cords under direct vision, positive ETCO2, CO2 detector and breath sounds checked- equal and bilateral Secured at: 23 cm Tube secured with: Tape Dental Injury: Teeth and Oropharynx as per pre-operative assessment

## 2023-10-15 NOTE — Op Note (Signed)
 Procedure: Cystoscopy with transurethral resection of bladder tumor for restaging.  Preop diagnosis: T1 high-grade nonmuscle invasive bladder cancer of the right lateral wall.  Postop diagnosis: Same.  Surgeon: Dr. Homero Luster.  Anesthesia: General.  Specimen: TUR chips.  Drains: 18 French Foley catheter.  EBL: None.  Complications: None.  Indications: The patient is an 89 year old male with a history of bladder cancer who previously underwent a biopsy for an erythematous lesion on the right lateral wall which looked most consistent with inflammation or possibly carcinoma in situ but proved to be a T1 high-grade nonmuscle invasive tumor and it was felt that restaging a more complete resection was indicated.  Procedure: He was taken the operating room was given 2 g of Ancef .  A general anesthetic was induced.  He was placed in lithotomy position and fitted with PAS hose.  His Foley catheter was removed and he he was prepped with Betadine solution and draped in usual sterile fashion.  Next the 28 French continuous-flow resectoscope sheath was placed with the aid of visual obturator.  He is in the 30 degree lens.  Examination revealed a normal urethra.  The external sphincter had radiation changes.  The prostatic urethra was absent with radiation changes.  Ureteral orifice ease were near the bladder neck and unremarkable.  There was some patchy erythema of the bladder wall from radiation changes in the recent Foley an area of the prior biopsy on the right anterior lateral wall lobe of the bladder with some surrounding edema.  In further inspection demonstrated some mucosa that was slightly irregular anteriorly on the right closer to the bladder neck.  The entire area was approximately 3 x 5 cm.  Once thorough inspection of been performed the lesion on the right anterior bladder wall was generously resected into muscle and then the resection bed as well as a margin of surrounding mucosa including  the irregular area in the anterior bladder neck were generously fulgurated.  The bladder was evacuated free of chips and final hemostasis was achieved.  There was no evidence of bladder wall perforation.  The resectoscope was removed and an 97 French Foley catheter was inserted.  The catheter balloon was filled with 10 mL of sterile fluid and then irrigated with clear return.  The catheter was placed to straight drainage.  He was taken down from lithotomy position, his anesthetic was reversed and he was moved recovery room in stable condition.  There were no complications.

## 2023-10-15 NOTE — Discharge Instructions (Addendum)
 You may remove the foley on Thursday morning.  You may resume the Eliquis  on Friday morning.

## 2023-10-15 NOTE — Transfer of Care (Signed)
 Immediate Anesthesia Transfer of Care Note  Patient: ANGELL HONSE  Procedure(s) Performed: TURBT (TRANSURETHRAL RESECTION OF BLADDER TUMOR)  Patient Location: PACU  Anesthesia Type:General  Level of Consciousness: awake, alert , and patient cooperative  Airway & Oxygen Therapy: Patient Spontanous Breathing and Patient connected to face mask oxygen  Post-op Assessment: Report given to RN and Post -op Vital signs reviewed and stable  Post vital signs: Reviewed and stable  Last Vitals:  Vitals Value Taken Time  BP 137/95   Temp    Pulse 108 10/15/23 1436  Resp 16   SpO2 100 % 10/15/23 1436  Vitals shown include unfiled device data.  Last Pain:  Vitals:   10/15/23 1142  TempSrc:   PainSc: 0-No pain         Complications: No notable events documented.

## 2023-10-15 NOTE — Anesthesia Postprocedure Evaluation (Signed)
 Anesthesia Post Note  Patient: Dillon Young  Procedure(s) Performed: TURBT (TRANSURETHRAL RESECTION OF BLADDER TUMOR)     Patient location during evaluation: PACU Anesthesia Type: General Level of consciousness: awake and alert, oriented and patient cooperative Pain management: pain level controlled Vital Signs Assessment: post-procedure vital signs reviewed and stable Respiratory status: spontaneous breathing, nonlabored ventilation and respiratory function stable Cardiovascular status: blood pressure returned to baseline and stable Postop Assessment: no apparent nausea or vomiting and adequate PO intake Anesthetic complications: no   No notable events documented.  Last Vitals:  Vitals:   10/15/23 1513 10/15/23 1515  BP: 105/83 105/83  Pulse: 73 (!) 46  Resp: (!) 26 (!) 21  Temp:    SpO2: 90% 93%    Last Pain:  Vitals:   10/15/23 1513  TempSrc:   PainSc: 5                  Deland Slocumb,E. Tymel Conely

## 2023-10-16 ENCOUNTER — Other Ambulatory Visit: Payer: Self-pay | Admitting: Urology

## 2023-10-16 ENCOUNTER — Encounter (HOSPITAL_COMMUNITY): Payer: Self-pay | Admitting: Urology

## 2023-10-17 LAB — SURGICAL PATHOLOGY

## 2023-10-23 DIAGNOSIS — N3041 Irradiation cystitis with hematuria: Secondary | ICD-10-CM | POA: Diagnosis not present

## 2023-10-23 DIAGNOSIS — C678 Malignant neoplasm of overlapping sites of bladder: Secondary | ICD-10-CM | POA: Diagnosis not present

## 2023-10-23 DIAGNOSIS — N3945 Continuous leakage: Secondary | ICD-10-CM | POA: Diagnosis not present

## 2023-10-24 ENCOUNTER — Telehealth: Payer: Self-pay

## 2023-10-24 NOTE — Telephone Encounter (Signed)
 I spoke with Dillon Young, he is scheduled to see Dr. Alita Irwin on 6/4. He is aware of our location details.

## 2023-10-29 NOTE — Progress Notes (Unsigned)
 [facility] CONSULT NOTE  Patient Care Team: Arcadio Knuckles, MD as PCP - General (Internal Medicine) Avanell Leigh, MD as PCP - Cardiology (Cardiology) [name], Tino Foreman, [facility] (Inactive) (Pharmacist)  ASSESSMENT & PLAN:  Dillon Young is a 89 y.o.male with history of prostate cancer with IMRT, NMIBC s/p BCG induction/maintenance being [facility] for NMIBC.  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 BCG refractory Previous treatment: BCG  Discussed available treatment for NMIBC. Cystectomy is the only curative intent. Other treatment can result in recurrence free periods but will likely recur. Discussed for example intravesical gem-doce, iv pembrolizumab. Discussed toxicities and efficacy of each. Recommend gem-doce. Side effects are mostly manageable and local. After discussion he would like to proceed. Wife was in the room and daughter on the phone as well. All questions answered.  Gem-Doce Protocol   Gemcitabine  - Docetaxel (Gem-Doce): Indwelling catheter is placed and the bladder is drained. 1 gram gemcitabine  in 50 cc saline instilled for 1 hour (foley is capped). After 1 hour, uncap foley and drain the gemcitabine . Then instill docetaxel (37.5 mg in 50 cc saline) for 1 hour. After docetaxel instillation, catheter can be removed and patient can void medication after leaving the office.   - Induction- Weekly x 6 weeks   - Maintenance- Monthly x 24 months with cystoscopy q47months.     Key Points:   (1) Minimize drug dilution   a. Overnight fast/dehydration - night before treatment   b. Avoid all diuretics (including alcohol  & coffee) - within 8 hours of treatment   c. Empty the bladder completely - NOTE: typically, lidocaine  jelly forms a plug that prevents free flow of urine from bladder after catheter insertion. Air plugs can do the same. Apply gentle aspiration (e.g. using the spent urojet) to start the flow of urine. Then drain bladder completely before  instilling drug.   (2) Adjust pH (alkalinize for gemcitabine )   a. Give the patient 1300 mg of sodium bicarbonate (two 650 mg tabs) the night before and morning of therapy to alkalinize the urine.   (3)  Unlike BCG, mild gross hematuria is NOT a contraindication to use of chemo.   (4) Maintenance therapy: Monthly treatments for 12-24 months (24 months preferred - there is a 2.3 fold increase in the hazard ratio for cancer recurrence without maintenance)   This regimen has shown safety and efficacy in various NMIBC disease states including intermediate risk disease, high-risk BCG nave, high risk BCG failure x 1, high-risk BCG unresponsive.   (5) analgesic. Pyridium 200 mg the morning of treatment, then 3 times daily for 2 days as needed for pain   (6) oxybutynin 5 mg once as premed with compazine.  Assessment & Plan Malignant neoplasm of overlapping sites of bladder [facility]) Teaching to be completed Treatment plan entered Prescribed Pyridium 200 mg the morning of treatment, then 3 times daily for 2 days as needed for pain Prescribed 1300 mg of sodium bicarbonate (two 650 mg tabs) the night before and morning of therapy to alkalinize the urine. Premed with oxybutynin and compazine Plan to start treatment in 2-3 weeks Return weekly with lab and MD visit during induction Follow up with cystoscopy every 3 months.  Orders Placed This Encounter  Procedures   Consent Attestation for Oncology Treatment    The patient is informed of risks, benefits, side-effects of the prescribed oncology treatment. Potential short term and long term side effects and response rates discussed. After a long discussion, the patient made  informed decision to proceed.:   Yes   CBC with Differential (Cancer Center Only)    Standing Status:   Future    Expected Date:   11/14/2023    Expiration Date:   11/13/2024   CMP (Cancer Center only)    Standing Status:   Future    Expected Date:   11/14/2023    Expiration Date:    11/13/2024   CBC with Differential (Cancer Center Only)    Standing Status:   Future    Expected Date:   11/21/2023    Expiration Date:   11/20/2024   CMP (Cancer Center only)    Standing Status:   Future    Expected Date:   11/21/2023    Expiration Date:   11/20/2024   CBC with Differential (Cancer Center Only)    Standing Status:   Future    Expected Date:   11/28/2023    Expiration Date:   11/27/2024   CMP (Cancer Center only)    Standing Status:   Future    Expected Date:   11/28/2023    Expiration Date:   11/27/2024   CBC with Differential (Cancer Center Only)    Standing Status:   Future    Expected Date:   12/05/2023    Expiration Date:   12/04/2024   CMP (Cancer Center only)    Standing Status:   Future    Expected Date:   12/05/2023    Expiration Date:   12/04/2024   CBC with Differential (Cancer Center Only)    Standing Status:   Future    Expected Date:   12/12/2023    Expiration Date:   12/11/2024   CMP (Cancer Center only)    Standing Status:   Future    Expected Date:   12/12/2023    Expiration Date:   12/11/2024   CBC with Differential (Cancer Center Only)    Standing Status:   Future    Expected Date:   12/19/2023    Expiration Date:   12/18/2024   CMP (Cancer Center only)    Standing Status:   Future    Expected Date:   12/19/2023    Expiration Date:   12/18/2024   [facility] PHYSICIAN COMMUNICATION 1    Adjuvant: 6-week cycle beginning 3 - 4 weeks after surgery for a maximum of 2 cycles (Milbar N, et al.)    The total time spent in the appointment was 67 minutes encounter with patients including review of chart and various tests results, discussions about plan of care and coordination of care plan   All questions were answered. The patient knows to call the clinic with any problems, questions or concerns. No barriers to learning was detected.  Lowanda Ruddy, MD 6/4/20252:07 PM  CHIEF COMPLAINTS/PURPOSE OF CONSULTATION:  NMIBC  HISTORY OF PRESENTING ILLNESS:  Dillon Young 89 y.o. male is here because of NMIBC I have reviewed his chart and materials related to his cancer extensively and collaborated history with the patient. Summary of oncologic history is as follows: Oncology History  Bladder cancer (HCC)  05/31/2020 Pathology Results   A. PROSTATE AND PROSTATE URETHRA CHIPS, TURP:  High grade urothelial carcinoma  The carcinoma focally invades laminar propria  Muscularis propria (detrusor muscle) is present and not involves  Prostatic glands is not present    01/06/2021 Pathology Results   A. BLADDER, WALL, BIOPSY:  -  High-grade urothelial carcinoma, noninvasive  -  Detrusor muscle present but uninvolved by  carcinoma   B. BLADDER, LEFT LATERAL WALL, BIOPSY:  -  High-grade urothelial carcinoma, noninvasive  -  No detrusor muscle identified    2022 - 2024 Chemotherapy   BCG Induction and maintenance.   01/10/2022 Initial Diagnosis   Bladder cancer (HCC)   09/20/2023 Pathology Results   BLADDER WALL, RIGHT LATERAL, BIOPSY:  High grade urothelial carcinoma  The carcinoma focally invades laminar propria  Muscularis propria (detrusor muscle) is not present    10/15/2023 Pathology Results   BLADDER TUMOR, TURBT:  Small fragments of high-grade urothelial carcinoma  Submucosa and muscularis propria with chronic inflammation  Negative for invasive carcinoma    10/29/2023 Cancer Staging   Staging form: Urinary Bladder, AJCC 8th Edition - Clinical: Stage I (cT1, cN0, cM0) - Signed by Lowanda Ruddy, MD on 10/29/2023 WHO/ISUP grade (low/high): High Grade Histologic grading system: 2 grade system   11/14/2023 -  Chemotherapy   Patient is on Treatment Plan : BLADDER Gemcitabine  (1000), Docetaxel (37.5) INTRAVESICAL q7d       MEDICAL HISTORY:  Past Medical History:  Diagnosis Date   Adenomatous colon polyp    Allergic rhinitis    Anemia    Bladder cancer (HCC)    currently on bcg treatments   BPH (benign prostatic hyperplasia)    CAD (coronary  artery disease)    sees Dr. Lauro Portal    Carotid artery disease [facility])    CHF (congestive heart failure) (HCC)    Chronic kidney disease    Sones. Prostate cancer   Clotting disorder (HCC)    CVA (cerebral infarction)    Diverticulitis    Dysrhythmia    atrial fib - hx of    Edema    recent swelling in feet and ankles    Hemorrhoids    History of kidney stones    HTN (hypertension)    Hx of radiation therapy    for prostate - 2013    Hyperlipidemia LDL goal <70    IBS (irritable bowel syndrome)    Low back pain    Pre-diabetes    Prostate cancer (HCC) 2008   seed implantation   Sleep apnea    sitting in recliner helps him to breathe per pt , never used cpap sleep study - pt reports could not go to sleep- 6 years ago    Smoker    Stroke [facility])    hx of mini strokes years ago     SURGICAL HISTORY: Past Surgical History:  Procedure Laterality Date   APPENDECTOMY     CARDIAC CATHETERIZATION N/A 01/16/2016   Procedure: Left Heart Cath and Coronary Angiography;  Surgeon: Lucendia Rusk, MD;  Location: [facility] INVASIVE CV LAB;  Service: Cardiovascular;  Laterality: N/A;   CARDIAC CATHETERIZATION N/A 01/16/2016   Procedure: Coronary Stent Intervention;  Surgeon: Lucendia Rusk, MD;  Location: [facility] INVASIVE CV LAB;  Service: Cardiovascular;  Laterality: N/A;   CARDIOVERSION N/A 11/26/2017   Procedure: CARDIOVERSION;  Surgeon: Hazle Lites, MD;  Location: [facility] ENDOSCOPY;  Service: Cardiovascular;  Laterality: N/A;   CARDIOVERSION N/A 03/13/2018   Procedure: CARDIOVERSION;  Surgeon: Hazle Lites, MD;  Location: [facility] ENDOSCOPY;  Service: Cardiovascular;  Laterality: N/A;   CARDIOVERSION N/A 07/31/2022   Procedure: CARDIOVERSION;  Surgeon: Lenise Quince, MD;  Location: [facility] ENDOSCOPY;  Service: Cardiovascular;  Laterality: N/A;   CARDIOVERSION N/A 09/10/2022   Procedure: CARDIOVERSION;  Surgeon: Elmyra Haggard, MD;  Location: [facility] INVASIVE CV LAB;  Service: Cardiovascular;  Laterality: N/A;   CATARACT EXTRACTION W/ INTRAOCULAR LENS  IMPLANT, BILATERAL Bilateral    COLONOSCOPY  10/21/2008   diverticulosis, radiation proctitis, internal hemorrhoids   CORONARY ANGIOPLASTY     CORONARY STENT INTERVENTION N/A 12/26/2017   Procedure: CORONARY STENT INTERVENTION;  Surgeon: Avanell Leigh, MD;  Location: [facility] INVASIVE CV LAB;  Service: Cardiovascular;  Laterality: N/A;   CYSTOSCOPY WITH BIOPSY N/A 09/10/2023   Procedure: CYSTOSCOPY, WITH BIOPSY/BILATERAL RETROGRADE;  Surgeon: Homero Luster, MD;  Location: [facility] ORS;  Service: Urology;  Laterality: N/A;  BILATERAL RETROGRADE   ENDARTERECTOMY  11/12/2011   Procedure: ENDARTERECTOMY CAROTID;  Surgeon: Mayo Speck, MD;  Location[facility] OR;  Service: Vascular;  Laterality: Right;   ESOPHAGOGASTRODUODENOSCOPY (EGD) WITH ESOPHAGEAL DILATION     HEMORRHOID BANDING     LEFT HEART CATH AND CORONARY ANGIOGRAPHY N/A 12/26/2017   Procedure: LEFT HEART CATH AND CORONARY ANGIOGRAPHY;  Surgeon: Avanell Leigh, MD;  Location: [facility] INVASIVE CV LAB;  Service: Cardiovascular;  Laterality: N/A;   LITHOTRIPSY  X 3   LUMBAR LAMINECTOMY     TRANSURETHRAL RESECTION OF BLADDER TUMOR N/A 10/15/2023   Procedure: TURBT (TRANSURETHRAL RESECTION OF BLADDER TUMOR);  Surgeon: Homero Luster, MD;  Location: [facility] ORS;  Service: Urology;  Laterality: N/A;   TRANSURETHRAL RESECTION OF BLADDER TUMOR WITH MITOMYCIN -C Bilateral 01/06/2021   Procedure: TRANSURETHRAL RESECTION OF BLADDER TUMOR WITH GEMCITABINE  BILATERAL RETROGRADES;  Surgeon: Homero Luster, MD;  Location: [facility] ORS;  Service: Urology;  Laterality: Bilateral;   TRANSURETHRAL RESECTION OF PROSTATE  10/2003   TRANSURETHRAL RESECTION OF PROSTATE N/A 05/31/2020   Procedure: CYSTOSCOPY TRANSURETHRAL RESECTION BIOPSY AND FULGURATION OF PROSTATE LESION  (TURP);  Surgeon: Homero Luster, MD;  Location: [facility] ORS;  Service: Urology;  Laterality: N/A;    SOCIAL HISTORY: Social History   Socioeconomic History   Marital status:  Married    Spouse name: Not on file   Number of children: 2   Years of education: Not on file   Highest education level: Not on file  Occupational History   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) [employer]   Occupation: Retired  Tobacco Use   Smoking status: Former    Current packs/day: 0.00    Average packs/day: 1.5 packs/day for 60.0 years (90.0 ttl pk-yrs)    Types: Cigarettes    Start date: 07/25/1948    Quit date: 07/25/2008    Years since quitting: 15.2   Smokeless tobacco: Never  Vaping Use   Vaping status: Never Used  Substance and Sexual Activity   Alcohol  use: Never   Drug use: Never   Sexual activity: Not Currently  Other Topics Concern   Not on file  Social History Narrative   Married second time   Retired [employer] n39 yrs   2 daughters and 2 stepdaughters   Social Drivers of Corporate investment banker Strain: Low Risk  (10/19/2022)   Overall Financial Resource Strain (CARDIA)    Difficulty of Paying Living Expenses: Not hard at all  Food Insecurity: No Food Insecurity (10/19/2022)   Hunger Vital Sign    Worried About Running Out of Food in the Last Year: Never true    Ran Out of Food in the Last Year: Never true  Transportation Needs: No Transportation Needs (10/19/2022)   PRAPARE - Administrator, Civil Service (Medical): No    Lack of Transportation (Non-Medical): No  Physical Activity: Insufficiently Active (10/19/2022)   Exercise Vital Sign    Days of Exercise  per Week: 4 days    Minutes of Exercise per Session: 30 min  Stress: No Stress Concern Present (10/19/2022)   [employer] of Occupational Health - Occupational Stress Questionnaire    Feeling of Stress : Only a little  Social Connections: Moderately Isolated (10/19/2022)   Social Connection and Isolation Panel [NHANES]    Frequency of Communication with Friends and Family: More than three times a week    Frequency of Social Gatherings with Friends and Family: More than three  times a week    Attends Religious Services: Never    Database administrator or Organizations: No    Attends Banker Meetings: Never    Marital Status: Married  Catering manager Violence: Not At Risk (10/19/2022)   Humiliation, Afraid, Rape, and Kick questionnaire    Fear of Current or Ex-Partner: No    Emotionally Abused: No    Physically Abused: No    Sexually Abused: No    FAMILY HISTORY: Family History  Problem Relation Age of Onset   Heart disease Father    Hypertension Mother    Asthma Paternal Grandmother    Colon cancer Neg Hx    Esophageal cancer Neg Hx    Stomach cancer Neg Hx    Rectal cancer Neg Hx    Liver cancer Neg Hx     ALLERGIES:  is allergic to lipitor [atorvastatin ] and other.  MEDICATIONS:  Current Outpatient Medications  Medication Sig Dispense Refill   acetaminophen  (TYLENOL ) 500 MG tablet Take 500-1,000 mg by mouth daily as needed for mild pain or headache.     ascorbic acid  (VITAMIN C ) 500 MG tablet Take 500 mg by mouth 2 (two) times a week.     carvedilol  (COREG ) 3.125 MG tablet TAKE 1 TABLET BY MOUTH TWICE A DAY WITH A MEAL *TAKE WITH 6.25MG  TO EQUAL 9.375MG  TWICE A DAY* 180 tablet 0   carvedilol  (COREG ) 6.25 MG tablet Take 1 tablet (6.25 mg total) by mouth 2 (two) times daily. Take with 3.125 to equal 9.375 twice daily 180 tablet 0   cefdinir  (OMNICEF ) 300 MG capsule TAKE 1 CAPSULE BY MOUTH 2 TIMES DAILY FOR 7 DAYS. 6 capsule 0   Cholecalciferol  (VITAMIN D -3) 25 MCG (1000 UT) CAPS Take 1,000 Units by mouth daily.     dicyclomine  (BENTYL ) 10 MG capsule TAKE 1 CAPSULE (10 MG TOTAL) BY MOUTH EVERY 6 (SIX) HOURS AS NEEDED (ABDOMINAL PAIN). 30 capsule 1   ELIQUIS  5 MG TABS tablet TAKE 1 TABLET BY MOUTH TWICE A DAY 180 tablet 1   fexofenadine (ALLEGRA) 180 MG tablet Take 180 mg by mouth daily as needed for allergies or rhinitis.      Fluocinolone  Acetonide 0.01 % OIL 3 drops in affected ear(s) once or twice daily as needed for itching      fluticasone  (FLONASE ) 50 MCG/ACT nasal spray Place 2 sprays into both nostrils daily. (Patient taking differently: Place 2 sprays into both nostrils daily as needed for allergies.) 16 g 6   gabapentin  (NEURONTIN ) 300 MG capsule TAKE 1 CAPSULE BY MOUTH THREE TIMES A DAY 270 capsule 1   hydrALAZINE  (APRESOLINE ) 25 MG tablet Take 25 mg by mouth 3 (three) times daily as needed (for bp over 160).     hydrocortisone  (ANUSOL -HC) 2.5 % rectal cream Place 1 application rectally 3 (three) times daily as needed for hemorrhoids. 30 g 1   Hypromellose (ARTIFICIAL TEARS OP) Place 1 drop into both eyes 4 (four) times daily as needed (  dry eyes).     ipratropium (ATROVENT ) 0.06 % nasal spray Place 2 sprays into both nostrils 2 (two) times daily. (Patient taking differently: Place 2 sprays into both nostrils 2 (two) times daily as needed for rhinitis.) 45 mL 1   melatonin 5 MG TABS Take 5 mg by mouth at bedtime as needed (sleep).     nitroGLYCERIN  (NITROSTAT ) 0.4 MG SL tablet Place 1 tablet (0.4 mg total) under the tongue every 5 (five) minutes as needed for chest pain. 25 tablet 3   oxyCODONE -acetaminophen  (PERCOCET) 5-325 MG tablet Take 1 tablet by mouth every 6 (six) hours as needed for severe pain (pain score 7-10). 10 tablet 0   rosuvastatin  (CRESTOR ) 10 MG tablet TAKE 1 TABLET BY MOUTH EVERY DAY 90 tablet 0   sacubitril -valsartan  (ENTRESTO ) 49-51 MG Take 1 tablet by mouth 2 (two) times daily. 60 tablet 6   sacubitril -valsartan  (ENTRESTO ) 97-103 MG Take 0.5 tablets by mouth 2 (two) times daily.     sodium chloride  (OCEAN) 0.65 % SOLN nasal spray Place 1 spray into both nostrils as needed for congestion.     SPIRIVA  RESPIMAT 2.5 MCG/ACT AERS Inhale 2 puffs into the lungs daily as needed (shortness of breath).     triamcinolone cream (KENALOG) 0.1 % Apply 1 application topically daily as needed (to affected areas, for itching).      ondansetron  (ZOFRAN ) 8 MG tablet Take 1 tablet (8 mg total) by mouth every 8 (eight)  hours as needed for nausea or vomiting. 30 tablet 1   phenazopyridine (PYRIDIUM) 200 MG tablet Take one tab the morning of treatment, then up to 3 times a day as needed for pain for 2 days 30 tablet 1   prochlorperazine (COMPAZINE) 10 MG tablet Take 1 tablet (10 mg total) by mouth every 6 (six) hours as needed for nausea or vomiting. 30 tablet 1   sodium bicarbonate 650 MG tablet Take 1300 mg (two 650 mg tabs) the night before and morning of therapy. 24 tablet 1   spironolactone  (ALDACTONE ) 25 MG tablet Take 1 tablet (25 mg total) by mouth daily. (Patient taking differently: Take 12.5 mg by mouth daily.) 90 tablet 3   No current facility-administered medications for this visit.    REVIEW OF SYSTEMS:   All relevant systems were reviewed with the patient and are negative.  PHYSICAL EXAMINATION: ECOG PERFORMANCE STATUS: 1 - Symptomatic but completely ambulatory  Vitals:   10/30/23 1159  BP: 139/75  Pulse: 76  Resp: 15  Temp: (!) 97 F (36.1 C)  SpO2: 100%   Filed Weights   10/30/23 1159  Weight: 171 lb 8 oz (77.8 kg)    GENERAL: alert, no distress and comfortable SKIN: skin color is normal, no jaundice LUNGS: Effort normal, no respiratory distress.   LABORATORY DATA:  I have reviewed the data as listed Lab Results  Component Value Date   WBC 5.5 10/09/2023   HGB 13.6 10/09/2023   HCT 42.9 10/09/2023   MCV 83.1 10/09/2023   PLT 162 10/09/2023   Recent Labs    07/01/23 0855 07/31/23 1057 09/10/23 1200 10/05/23 0950 10/09/23 1317  NA 142   < > 140 139 139  K 4.7   < > 4.1 4.1 4.1  CL 104   < > 103 105 105  CO2 30   < > 25 26 26   GLUCOSE 119*   < > 111* 111* 145*  BUN 17   < > 21 20 17   CREATININE 1.11   < >  1.17 1.22 0.99  CALCIUM  9.0   < > 9.3 9.5 9.4  GFRNONAA  --    < > 60* 57* >60  PROT 6.8  --   --   --   --   ALBUMIN 4.3  --   --   --   --   AST 15  --   --   --   --   ALT 7  --   --   --   --   ALKPHOS 48  --   --   --   --   BILITOT 0.9  --   --   --    --   BILIDIR 0.2  --   --   --   --    < > = values in this interval not displayed.    RADIOGRAPHIC STUDIES: I have personally reviewed the radiological images as listed and agreed with the findings in the report. VAS US  CAROTID Result Date: 10/17/2023 Carotid Arterial Duplex Study Patient Name:  Dillon Young  Date of Exam:   10/14/2023 Medical Rec #: [id number]         Accession #:    [id number] Date of Birth: July 01, 1932         Patient Gender: M Patient Age:   44 years Exam Location:  [address] Procedure:      VAS US  CAROTID Referring Phys: Arlyce Lambert BERRY --------------------------------------------------------------------------------  Indications:       Carotid artery disease and right endarterectomy. Risk Factors:      Hypertension, hyperlipidemia, past history of smoking,                    coronary artery disease. Comparison Study:  09/26/2022 Carotid artery duplex- Right Carotid: Patent right                    carotid endarterectomy site without evidence of significant                    diameter reduction or stenosis.                     Left Carotid: Velocities in the left ICA are consistent with                    a 1-39% stenosis, high-end of range.                     Vertebrals: Bilateral vertebral arteries demonstrate                    antegrade flow.                    Subclavians: Normal flow hemodynamics were seen in bilateral                    subclavian arteries. Performing Technologist: Delford Felling MHA, RDMS, RVT, RDCS  Examination Guidelines: A complete evaluation includes B-mode imaging, spectral Doppler, color Doppler, and power Doppler as needed of all accessible portions of each vessel. Bilateral testing is considered an integral part of a complete examination. Limited examinations for reoccurring indications may be performed as noted.  Right Carotid Findings: +----------+--------+--------+--------+---------------------+------------------+           PSV cm/sEDV  cm/sStenosisPlaque Description   Comments           +----------+--------+--------+--------+---------------------+------------------+ CCA Prox  37  6                                    intimal thickening +----------+--------+--------+--------+---------------------+------------------+ CCA Distal66      12              smooth and                                                                homogeneous                             +----------+--------+--------+--------+---------------------+------------------+ ICA Prox  41      10                                                      +----------+--------+--------+--------+---------------------+------------------+ ICA Mid   70      15                                                      +----------+--------+--------+--------+---------------------+------------------+ ICA Distal56      15                                                      +----------+--------+--------+--------+---------------------+------------------+ ECA       46                                                              +----------+--------+--------+--------+---------------------+------------------+ +----------+--------+-------+----------------+-------------------+           PSV cm/sEDV cmsDescribe        Arm Pressure (mmHG) +----------+--------+-------+----------------+-------------------+ [id number]             Multiphasic, [id number]                 +----------+--------+-------+----------------+-------------------+ +---------+--------+--+--------+-+---------+ VertebralPSV cm/s83EDV cm/s9Antegrade +---------+--------+--+--------+-+---------+  Left Carotid Findings: +---------+--------+-------+--------+---------------------------------+--------+          PSV cm/sEDV    StenosisPlaque Description               Comments                  cm/s                                                      +---------+--------+-------+--------+---------------------------------+--------+ CCA Prox 65      10  smooth and homogeneous                    +---------+--------+-------+--------+---------------------------------+--------+ CCA      127     17     >50%    smooth and heterogenous                   Distal                                                                    +---------+--------+-------+--------+---------------------------------+--------+ ICA Prox 226     52     40-59%  smooth and heterogenous                   +---------+--------+-------+--------+---------------------------------+--------+ ICA Mid  107     21                                                       +---------+--------+-------+--------+---------------------------------+--------+ ICA      67      15                                                       Distal                                                                    +---------+--------+-------+--------+---------------------------------+--------+ ECA      152                    heterogenous, irregular and                                               calcific                                  +---------+--------+-------+--------+---------------------------------+--------+ +----------+--------+--------+--------+-------------------+           PSV cm/sEDV cm/sDescribeArm Pressure (mmHG) +----------+--------+--------+--------+-------------------+ [id number]             Stenotic131                 +----------+--------+--------+--------+-------------------+ +---------+--------+--+--------+--+---------+ VertebralPSV cm/s54EDV cm/s16Antegrade +---------+--------+--+--------+--+---------+   Summary: Right Carotid: Patent right ICA endarterectomy site without evidence of                restenosis. Left Carotid: Velocities in the left ICA are consistent with a 40-59% stenosis. Vertebrals:  Bilateral  vertebral arteries demonstrate antegrade flow. Subclavians: Left subclavian artery was stenotic. Normal flow hemodynamics were  seen in the right subclavian artery. *See table(s) above for measurements and observations.  Electronically signed by Lauro Portal MD on 10/17/2023 at 1:08:47 PM.    Final

## 2023-10-30 ENCOUNTER — Inpatient Hospital Stay

## 2023-10-30 VITALS — BP 139/75 | HR 76 | Temp 97.0°F | Resp 15 | Wt 171.5 lb

## 2023-10-30 DIAGNOSIS — Z5111 Encounter for antineoplastic chemotherapy: Secondary | ICD-10-CM | POA: Insufficient documentation

## 2023-10-30 DIAGNOSIS — Z8546 Personal history of malignant neoplasm of prostate: Secondary | ICD-10-CM | POA: Diagnosis not present

## 2023-10-30 DIAGNOSIS — R300 Dysuria: Secondary | ICD-10-CM | POA: Insufficient documentation

## 2023-10-30 DIAGNOSIS — Z87891 Personal history of nicotine dependence: Secondary | ICD-10-CM | POA: Diagnosis not present

## 2023-10-30 DIAGNOSIS — Z9079 Acquired absence of other genital organ(s): Secondary | ICD-10-CM | POA: Diagnosis not present

## 2023-10-30 DIAGNOSIS — C678 Malignant neoplasm of overlapping sites of bladder: Secondary | ICD-10-CM | POA: Insufficient documentation

## 2023-10-30 MED ORDER — SODIUM BICARBONATE 650 MG PO TABS
ORAL_TABLET | ORAL | 1 refills | Status: DC
Start: 2023-10-30 — End: 2023-12-19

## 2023-10-30 MED ORDER — PHENAZOPYRIDINE HCL 200 MG PO TABS: ORAL_TABLET | ORAL | 1 refills | Status: DC

## 2023-10-30 MED ORDER — ONDANSETRON HCL 8 MG PO TABS: 8.0000 mg | ORAL_TABLET | Freq: Three times a day (TID) | ORAL | 1 refills | Status: DC | PRN

## 2023-10-30 MED ORDER — PROCHLORPERAZINE MALEATE 10 MG PO TABS: 10.0000 mg | ORAL_TABLET | Freq: Four times a day (QID) | ORAL | 1 refills | Status: DC | PRN

## 2023-10-30 NOTE — Patient Instructions (Signed)
  Gemcitabine  - Docetaxel (Gem-Doce): Indwelling catheter is placed and the bladder is drained. Gemcitabine  is instilled for 1 hour. After 1 hour then instill docetaxel for 1 hour. After docetaxel instillation, catheter can be removed and patient can void medication after leaving the office.   - Induction- Weekly x 6 weeks   - Maintenance- Monthly x 24 months with cystoscopy every 3 months.     Recommend:    a. Overnight fast/dehydration - night before treatment   b. Avoid all diuretics (including alcohol  & coffee) - within 8 hours of treatment   c. Empty the bladder completely    d. Take 1300 mg of sodium bicarbonate (two 650 mg tabs) the night before and morning of therapy to alkalinize the urine.   e. analgesic. Pyridium 200 mg the morning of treatment, then 3 times daily for 2 days as needed for pain

## 2023-10-30 NOTE — Assessment & Plan Note (Addendum)
 Teaching to be completed Treatment plan entered Prescribed Pyridium 200 mg the morning of treatment, then 3 times daily for 2 days as needed for pain Prescribed 1300 mg of sodium bicarbonate (two 650 mg tabs) the night before and morning of therapy to alkalinize the urine. Premed with oxybutynin and compazine Plan to start treatment in 2-3 weeks Return weekly with lab and MD visit during induction Follow up with cystoscopy every 3 months.

## 2023-10-31 ENCOUNTER — Other Ambulatory Visit: Payer: Self-pay

## 2023-11-03 ENCOUNTER — Other Ambulatory Visit: Payer: Self-pay

## 2023-11-04 ENCOUNTER — Other Ambulatory Visit: Payer: Self-pay

## 2023-11-04 DIAGNOSIS — M5481 Occipital neuralgia: Secondary | ICD-10-CM | POA: Diagnosis not present

## 2023-11-06 ENCOUNTER — Telehealth: Payer: Self-pay

## 2023-11-06 NOTE — Telephone Encounter (Signed)
....     Pre-operative Risk Assessment    Patient Name: Dillon Young  DOB: 02-15-1933 MRN: [id number]   Date of last office visit: 07/08/23 Date of next office visit: NONE   Request for Surgical Clearance    Procedure:  OCCIPITAL NERVE BLOCK IN [facility]  Date of Surgery:  Clearance TBD                                Surgeon:  DR Phares Brasher Surgeon's Group or Practice Name:  [facility] Phone number:  [phone] Fax number:  [phone]   Type of Clearance Requested:   - Medical  - Pharmacy:  Hold Apixaban  (Eliquis ) HOLD FOR 3 DAYS   Type of Anesthesia:  Not Indicated   Additional requests/questions:    Montel Antu   11/06/2023, 10:07 AM

## 2023-11-07 ENCOUNTER — Other Ambulatory Visit

## 2023-11-07 NOTE — Telephone Encounter (Signed)
 Patient with diagnosis of afib on Eliquis  for anticoagulation.    Procedure: OCCIPITAL NERVE BLOCK IN ESI  Date of procedure: TBD   CHA2DS2-VASc Score = 5   This indicates a 7.2% annual risk of stroke. The patient's score is based upon: CHF History: 0 HTN History: 1 Diabetes History: 1 Stroke History: 0 Vascular Disease History: 1 Age Score: 2 Gender Score: 0    CrCl 53 mL/min Platelet count 162  Patient has not had an Afib/aflutter ablation within the last 3 months or DCCV within the last 30 days  Per office protocol, patient can hold Eliquis  for 3 days prior to procedure.   Patient will not need bridging with Lovenox (enoxaparin) around procedure.  **This guidance is not considered finalized until pre-operative APP has relayed final recommendations.**

## 2023-11-07 NOTE — Progress Notes (Signed)
 Pharmacist Chemotherapy Monitoring - Initial Assessment    Anticipated start date: 11/14/23   The following has been reviewed per standard work regarding the patient's treatment regimen: The patient's diagnosis, treatment plan and drug doses, and organ/hematologic function Lab orders and baseline tests specific to treatment regimen  The treatment plan start date, drug sequencing, and pre-medications Prior authorization status  Patient's documented medication list, including drug-drug interaction screen and prescriptions for anti-emetics and supportive care specific to the treatment regimen The drug concentrations, fluid compatibility, administration routes, and timing of the medications to be used The patient's access for treatment and lifetime cumulative dose history, if applicable  The patient's medication allergies and previous infusion related reactions, if applicable   Changes made to treatment plan:  N/A  Follow up needed:  N/A   Cherylynn Cosier, RPH, 11/07/2023  11:50 AM

## 2023-11-08 ENCOUNTER — Telehealth: Payer: Self-pay

## 2023-11-08 NOTE — Telephone Encounter (Signed)
 S/W pt and scheduled TELE Preop appt 11/13/23. Med Rec and consent done

## 2023-11-08 NOTE — Telephone Encounter (Signed)
 Med Rec and consent done     Patient Consent for Virtual Visit        Dillon Young has provided verbal consent on 11/08/2023 for a virtual visit (video or telephone).   CONSENT FOR VIRTUAL VISIT FOR:  Dillon Young  By participating in this virtual visit I agree to the following:  I hereby voluntarily request, consent and authorize [facility] and its employed or contracted physicians, physician assistants, nurse practitioners or other licensed health care professionals (the Practitioner), to provide me with telemedicine health care services (the "Services) as deemed necessary by the treating Practitioner. I acknowledge and consent to receive the Services by the Practitioner via telemedicine. I understand that the telemedicine visit will involve communicating with the Practitioner through live audiovisual communication technology and the disclosure of certain medical information by electronic transmission. I acknowledge that I have been given the opportunity to request an in-person assessment or other available alternative prior to the telemedicine visit and am voluntarily participating in the telemedicine visit.  I understand that I have the right to withhold or withdraw my consent to the use of telemedicine in the course of my care at any time, without affecting my right to future care or treatment, and that the Practitioner or I may terminate the telemedicine visit at any time. I understand that I have the right to inspect all information obtained and/or recorded in the course of the telemedicine visit and may receive copies of available information for a reasonable fee.  I understand that some of the potential risks of receiving the Services via telemedicine include:  Delay or interruption in medical evaluation due to technological equipment failure or disruption; Information transmitted may not be sufficient (e.g. poor resolution of images) to allow for appropriate medical  decision making by the Practitioner; and/or  In rare instances, security protocols could fail, causing a breach of personal health information.  Furthermore, I acknowledge that it is my responsibility to provide information about my medical history, conditions and care that is complete and accurate to the best of my ability. I acknowledge that Practitioner's advice, recommendations, and/or decision may be based on factors not within their control, such as incomplete or inaccurate data provided by me or distortions of diagnostic images or specimens that may result from electronic transmissions. I understand that the practice of medicine is not an exact science and that Practitioner makes no warranties or guarantees regarding treatment outcomes. I acknowledge that a copy of this consent can be made available to me via my patient portal [facility] MyChart), or I can request a printed copy by calling the office of  [facility].    I understand that my insurance will be billed for this visit.   I have read or had this consent read to me. I understand the contents of this consent, which adequately explains the benefits and risks of the Services being provided via telemedicine.  I have been provided ample opportunity to ask questions regarding this consent and the Services and have had my questions answered to my satisfaction. I give my informed consent for the services to be provided through the use of telemedicine in my medical care

## 2023-11-12 ENCOUNTER — Inpatient Hospital Stay (HOSPITAL_BASED_OUTPATIENT_CLINIC_OR_DEPARTMENT_OTHER)

## 2023-11-12 ENCOUNTER — Inpatient Hospital Stay

## 2023-11-12 ENCOUNTER — Other Ambulatory Visit: Payer: Self-pay

## 2023-11-12 ENCOUNTER — Other Ambulatory Visit: Payer: Self-pay | Admitting: Internal Medicine

## 2023-11-12 VITALS — BP 158/111 | HR 73 | Temp 97.5°F | Resp 14 | Wt 170.3 lb

## 2023-11-12 DIAGNOSIS — C678 Malignant neoplasm of overlapping sites of bladder: Secondary | ICD-10-CM

## 2023-11-12 DIAGNOSIS — K582 Mixed irritable bowel syndrome: Secondary | ICD-10-CM | POA: Diagnosis not present

## 2023-11-12 DIAGNOSIS — C679 Malignant neoplasm of bladder, unspecified: Secondary | ICD-10-CM

## 2023-11-12 DIAGNOSIS — E785 Hyperlipidemia, unspecified: Secondary | ICD-10-CM

## 2023-11-12 DIAGNOSIS — Z5111 Encounter for antineoplastic chemotherapy: Secondary | ICD-10-CM | POA: Diagnosis not present

## 2023-11-12 LAB — CBC WITH DIFFERENTIAL (CANCER CENTER ONLY)
Abs Immature Granulocytes: 0.01 10*3/uL (ref 0.00–0.07)
Basophils Absolute: 0 10*3/uL (ref 0.0–0.1)
Basophils Relative: 1 %
Eosinophils Absolute: 0.1 10*3/uL (ref 0.0–0.5)
Eosinophils Relative: 1 %
HCT: 42.7 % (ref 39.0–52.0)
Hemoglobin: 13.8 g/dL (ref 13.0–17.0)
Immature Granulocytes: 0 %
Lymphocytes Relative: 16 %
Lymphs Abs: 1 10*3/uL (ref 0.7–4.0)
MCH: 26 pg (ref 26.0–34.0)
MCHC: 32.3 g/dL (ref 30.0–36.0)
MCV: 80.4 fL (ref 80.0–100.0)
Monocytes Absolute: 0.6 10*3/uL (ref 0.1–1.0)
Monocytes Relative: 10 %
Neutro Abs: 4.2 10*3/uL (ref 1.7–7.7)
Neutrophils Relative %: 72 %
Platelet Count: 137 10*3/uL — ABNORMAL LOW (ref 150–400)
RBC: 5.31 MIL/uL (ref 4.22–5.81)
RDW: 16 % — ABNORMAL HIGH (ref 11.5–15.5)
WBC Count: 5.8 10*3/uL (ref 4.0–10.5)
nRBC: 0 % (ref 0.0–0.2)

## 2023-11-12 LAB — CMP (CANCER CENTER ONLY)
ALT: 7 U/L (ref 0–44)
AST: 14 U/L — ABNORMAL LOW (ref 15–41)
Albumin: 4.8 g/dL (ref 3.5–5.0)
Alkaline Phosphatase: 59 U/L (ref 38–126)
Anion gap: 8 (ref 5–15)
BUN: 20 mg/dL (ref 8–23)
CO2: 29 mmol/L (ref 22–32)
Calcium: 9.9 mg/dL (ref 8.9–10.3)
Chloride: 104 mmol/L (ref 98–111)
Creatinine: 1.18 mg/dL (ref 0.61–1.24)
GFR, Estimated: 59 mL/min — ABNORMAL LOW (ref >60)
Glucose, Bld: 100 mg/dL — ABNORMAL HIGH (ref 70–99)
Potassium: 4.4 mmol/L (ref 3.5–5.1)
Sodium: 141 mmol/L (ref 135–145)
Total Bilirubin: 0.9 mg/dL (ref 0.0–1.2)
Total Protein: 7.9 g/dL (ref 6.5–8.1)

## 2023-11-12 NOTE — Assessment & Plan Note (Addendum)
 Continue dicyclomine  as needed per GI

## 2023-11-12 NOTE — Assessment & Plan Note (Addendum)
 Start week 1 on Friday. Ok to proceed without additional lab this week. Pyridium  200 mg the morning of treatment, then 3 times daily for 2 days as needed for pain 1300 mg of sodium bicarbonate  (two 650 mg tabs) the night before and morning of therapy to alkalinize the urine. Premed with oxybutynin and compazine  Return weekly with lab and MD visit during induction Follow up with cystoscopy every 3 months.

## 2023-11-12 NOTE — Progress Notes (Deleted)
 [facility] OFFICE PROGRESS NOTE  Patient Care Team: Arcadio Knuckles, MD as PCP - General (Internal Medicine) Avanell Leigh, MD as PCP - Cardiology (Cardiology) [name], Tino Foreman, [facility] (Inactive) (Pharmacist)  Dillon Young is a 89 y.o.male with history of prostate cancer with IMRT, NMIBC s/p BCG induction/maintenance [facility].   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 BCG refractory Previous treatment: BCG   Discussed available treatment for NMIBC. Cystectomy is the only curative intent. Other treatment can result in recurrence free periods but will likely recur. Discussed for example intravesical gem-doce, iv pembrolizumab. Discussed toxicities and efficacy of each. Recommend gem-doce. Side effects are mostly manageable and local. After discussion he would like to proceed. Wife was in the room and daughter on the phone as well. All questions answered.   Gem-Doce Protocol to be started today. Assessment & Plan Malignant neoplasm of urinary bladder, unspecified site (HCC) Start week 1 today Pyridium  200 mg the morning of treatment, then 3 times daily for 2 days as needed for pain 1300 mg of sodium bicarbonate  (two 650 mg tabs) the night before and morning of therapy to alkalinize the urine. Premed with oxybutynin and compazine  Return weekly with lab and MD visit during induction Follow up with cystoscopy every 3 months.  No orders of the defined types were placed in this encounter.    Lowanda Ruddy, MD  INTERVAL HISTORY: Patient returns for follow-up.  Oncology History  Bladder cancer (HCC)  05/31/2020 Pathology Results   A. PROSTATE AND PROSTATE URETHRA CHIPS, TURP:  High grade urothelial carcinoma  The carcinoma focally invades laminar propria  Muscularis propria (detrusor muscle) is present and not involves  Prostatic glands is not present    01/06/2021 Pathology Results   A. BLADDER, WALL, BIOPSY:  -  High-grade urothelial carcinoma,  noninvasive  -  Detrusor muscle present but uninvolved by carcinoma   B. BLADDER, LEFT LATERAL WALL, BIOPSY:  -  High-grade urothelial carcinoma, noninvasive  -  No detrusor muscle identified    2022 - 2024 Chemotherapy   BCG Induction and maintenance.   01/10/2022 Initial Diagnosis   Bladder cancer (HCC)   09/20/2023 Pathology Results   BLADDER WALL, RIGHT LATERAL, BIOPSY:  High grade urothelial carcinoma  The carcinoma focally invades laminar propria  Muscularis propria (detrusor muscle) is not present    10/15/2023 Pathology Results   BLADDER TUMOR, TURBT:  Small fragments of high-grade urothelial carcinoma  Submucosa and muscularis propria with chronic inflammation  Negative for invasive carcinoma    10/29/2023 Cancer Staging   Staging form: Urinary Bladder, AJCC 8th Edition - Clinical: Stage I (cT1, cN0, cM0) - Signed by Lowanda Ruddy, MD on 10/29/2023 WHO/ISUP grade (low/high): High Grade Histologic grading system: 2 grade system   11/14/2023 -  Chemotherapy   Patient is on Treatment Plan : BLADDER Gemcitabine  (1000), Docetaxel (37.5) INTRAVESICAL q7d        PHYSICAL EXAMINATION: ECOG PERFORMANCE STATUS: {CHL ONC ECOG [phone]}  There were no vitals filed for this visit. There were no vitals filed for this visit.  Relevant data reviewed during this visit included ***

## 2023-11-12 NOTE — Assessment & Plan Note (Deleted)
 Start week 1 today Pyridium  200 mg the morning of treatment, then 3 times daily for 2 days as needed for pain 1300 mg of sodium bicarbonate  (two 650 mg tabs) the night before and morning of therapy to alkalinize the urine. Premed with oxybutynin and compazine  Return weekly with lab and MD visit during induction Follow up with cystoscopy every 3 months.

## 2023-11-12 NOTE — Patient Instructions (Signed)
 Start week 1 of treatment on Friday.  Take Pyridium  200 mg the morning of treatment, then 3 times daily for 2 days as needed for pain for the next 2 days.  Take 1300 mg of sodium bicarbonate  (two 650 mg tabs) the night before and morning of therapy to alkalinize the urine.  Overnight fast/dehydration - night before treatment.  Did not drink too much fluid after dinner.  Skip morning breakfast if able.  Do not take any diuretics on the morning of treatment.  Return for signs of urinary tract infection, like sudden urgency, painful urination and frequency.  Reports significant bleeding.

## 2023-11-12 NOTE — Progress Notes (Signed)
 [facility] CONSULT NOTE  Patient Care Team: Arcadio Knuckles, MD as PCP - General (Internal Medicine) Avanell Leigh, MD as PCP - Cardiology (Cardiology) [name], Tino Foreman, [facility] (Inactive) (Pharmacist)  ASSESSMENT & PLAN:  Dillon Young is a 89 y.o.male with history of prostate cancer with IMRT, NMIBC s/p BCG induction/maintenance being [facility] for NMIBC.  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 BCG refractory Previous treatment: BCG  Discussed available treatment for NMIBC. Cystectomy is the only curative intent. Other treatment can result in recurrence free periods but will likely recur.  After discussion, he is comfortable proceeding with intravesical chemotherapy.  Gem-Doce Protocol   Gemcitabine  - Docetaxel (Gem-Doce): Indwelling catheter is placed and the bladder is drained. 1 gram gemcitabine  in 50 cc saline instilled for 1 hour (foley is capped). After 1 hour, uncap foley and drain the gemcitabine . Then instill docetaxel (37.5 mg in 50 cc saline) for 1 hour. After docetaxel instillation, catheter can be removed and patient can void medication after leaving the office.   - Induction- Weekly x 6 weeks   - Maintenance- Monthly x 24 months with cystoscopy q49months.   He has some bowel issue taking bentyl  when discomfort. Discussed may use as needed.  Report on spironolactone  daily. Will hold on the mornings of treatment.  No concerning lab findings today. Assessment & Plan Malignant neoplasm of urinary bladder, unspecified site (HCC) Start week 1 on Friday. Ok to proceed without additional lab this week. Pyridium  200 mg the morning of treatment, then 3 times daily for 2 days as needed for pain 1300 mg of sodium bicarbonate  (two 650 mg tabs) the night before and morning of therapy to alkalinize the urine. Premed with oxybutynin and compazine  Return weekly with lab and MD visit during induction Follow up with cystoscopy every 3 months. Irritable bowel syndrome  with both constipation and diarrhea Continue dicyclomine  as needed per GI  Report some leakage after last cystoscopy. Will reach out to Urology.  Instructions given today.  All questions were answered. The patient knows to call the clinic with any problems, questions or concerns. No barriers to learning was detected.  Lowanda Ruddy, MD 6/17/20254:39 PM  CHIEF COMPLAINTS/PURPOSE OF CONSULTATION:  NMIBC  HISTORY OF PRESENTING ILLNESS:  Dillon Young 89 y.o. male is here because of NMIBC.  Pain the mid abdomen for a few times this week. No nausea, vomiting, diarrhea, constipation. He denies bloody stool or dark stool.   No dysuria, frequency, urgency or blood.   I have reviewed his chart and materials related to his cancer extensively and collaborated history with the patient. Summary of oncologic history is as follows: Oncology History  Bladder cancer (HCC)  05/31/2020 Pathology Results   A. PROSTATE AND PROSTATE URETHRA CHIPS, TURP:  High grade urothelial carcinoma  The carcinoma focally invades laminar propria  Muscularis propria (detrusor muscle) is present and not involves  Prostatic glands is not present    01/06/2021 Pathology Results   A. BLADDER, WALL, BIOPSY:  -  High-grade urothelial carcinoma, noninvasive  -  Detrusor muscle present but uninvolved by carcinoma   B. BLADDER, LEFT LATERAL WALL, BIOPSY:  -  High-grade urothelial carcinoma, noninvasive  -  No detrusor muscle identified    2022 - 2024 Chemotherapy   BCG Induction and maintenance.   01/10/2022 Initial Diagnosis   Bladder cancer (HCC)   09/20/2023 Pathology Results   BLADDER WALL, RIGHT LATERAL, BIOPSY:  High grade urothelial carcinoma  The carcinoma focally invades  laminar propria  Muscularis propria (detrusor muscle) is not present    10/15/2023 Pathology Results   BLADDER TUMOR, TURBT:  Small fragments of high-grade urothelial carcinoma  Submucosa and muscularis propria with chronic  inflammation  Negative for invasive carcinoma    10/29/2023 Cancer Staging   Staging form: Urinary Bladder, AJCC 8th Edition - Clinical: Stage I (cT1, cN0, cM0) - Signed by Lowanda Ruddy, MD on 10/29/2023 WHO/ISUP grade (low/high): High Grade Histologic grading system: 2 grade system   11/14/2023 -  Chemotherapy   Patient is on Treatment Plan : BLADDER Gemcitabine  (1000), Docetaxel (37.5) INTRAVESICAL q7d       MEDICAL HISTORY:  Past Medical History:  Diagnosis Date   Adenomatous colon polyp    Allergic rhinitis    Anemia    Bladder cancer (HCC)    currently on bcg treatments   BPH (benign prostatic hyperplasia)    CAD (coronary artery disease)    sees Dr. Lauro Portal    Carotid artery disease [facility])    CHF (congestive heart failure) (HCC)    Chronic kidney disease    Sones. Prostate cancer   Clotting disorder (HCC)    CVA (cerebral infarction)    Diverticulitis    Dysrhythmia    atrial fib - hx of    Edema    recent swelling in feet and ankles    Hemorrhoids    History of kidney stones    HTN (hypertension)    Hx of radiation therapy    for prostate - 2013    Hyperlipidemia LDL goal <70    IBS (irritable bowel syndrome)    Low back pain    Pre-diabetes    Prostate cancer (HCC) 2008   seed implantation   Sleep apnea    sitting in recliner helps him to breathe per pt , never used cpap sleep study - pt reports could not go to sleep- 6 years ago    Smoker    Stroke [facility])    hx of mini strokes years ago     SURGICAL HISTORY: Past Surgical History:  Procedure Laterality Date   APPENDECTOMY     CARDIAC CATHETERIZATION N/A 01/16/2016   Procedure: Left Heart Cath and Coronary Angiography;  Surgeon: Lucendia Rusk, MD;  Location: [facility] INVASIVE CV LAB;  Service: Cardiovascular;  Laterality: N/A;   CARDIAC CATHETERIZATION N/A 01/16/2016   Procedure: Coronary Stent Intervention;  Surgeon: Lucendia Rusk, MD;  Location: [facility] INVASIVE CV LAB;  Service:  Cardiovascular;  Laterality: N/A;   CARDIOVERSION N/A 11/26/2017   Procedure: CARDIOVERSION;  Surgeon: Hazle Lites, MD;  Location: [facility] ENDOSCOPY;  Service: Cardiovascular;  Laterality: N/A;   CARDIOVERSION N/A 03/13/2018   Procedure: CARDIOVERSION;  Surgeon: Hazle Lites, MD;  Location: [facility] ENDOSCOPY;  Service: Cardiovascular;  Laterality: N/A;   CARDIOVERSION N/A 07/31/2022   Procedure: CARDIOVERSION;  Surgeon: Lenise Quince, MD;  Location: [facility] ENDOSCOPY;  Service: Cardiovascular;  Laterality: N/A;   CARDIOVERSION N/A 09/10/2022   Procedure: CARDIOVERSION;  Surgeon: Elmyra Haggard, MD;  Location: [facility] INVASIVE CV LAB;  Service: Cardiovascular;  Laterality: N/A;   CATARACT EXTRACTION W/ INTRAOCULAR LENS  IMPLANT, BILATERAL Bilateral    COLONOSCOPY  10/21/2008   diverticulosis, radiation proctitis, internal hemorrhoids   CORONARY ANGIOPLASTY     CORONARY STENT INTERVENTION N/A 12/26/2017   Procedure: CORONARY STENT INTERVENTION;  Surgeon: Avanell Leigh, MD;  Location: [facility] INVASIVE CV LAB;  Service: Cardiovascular;  Laterality: N/A;   CYSTOSCOPY WITH BIOPSY  N/A 09/10/2023   Procedure: CYSTOSCOPY, WITH BIOPSY/BILATERAL RETROGRADE;  Surgeon: Homero Luster, MD;  Location: [facility] ORS;  Service: Urology;  Laterality: N/A;  BILATERAL RETROGRADE   ENDARTERECTOMY  11/12/2011   Procedure: ENDARTERECTOMY CAROTID;  Surgeon: Mayo Speck, MD;  Location[facility] OR;  Service: Vascular;  Laterality: Right;   ESOPHAGOGASTRODUODENOSCOPY (EGD) WITH ESOPHAGEAL DILATION     HEMORRHOID BANDING     LEFT HEART CATH AND CORONARY ANGIOGRAPHY N/A 12/26/2017   Procedure: LEFT HEART CATH AND CORONARY ANGIOGRAPHY;  Surgeon: Avanell Leigh, MD;  Location: [facility] INVASIVE CV LAB;  Service: Cardiovascular;  Laterality: N/A;   LITHOTRIPSY  X 3   LUMBAR LAMINECTOMY     TRANSURETHRAL RESECTION OF BLADDER TUMOR N/A 10/15/2023   Procedure: TURBT (TRANSURETHRAL RESECTION OF BLADDER TUMOR);  Surgeon: Homero Luster, MD;  Location: [facility] ORS;   Service: Urology;  Laterality: N/A;   TRANSURETHRAL RESECTION OF BLADDER TUMOR WITH MITOMYCIN -C Bilateral 01/06/2021   Procedure: TRANSURETHRAL RESECTION OF BLADDER TUMOR WITH GEMCITABINE  BILATERAL RETROGRADES;  Surgeon: Homero Luster, MD;  Location: [facility] ORS;  Service: Urology;  Laterality: Bilateral;   TRANSURETHRAL RESECTION OF PROSTATE  10/2003   TRANSURETHRAL RESECTION OF PROSTATE N/A 05/31/2020   Procedure: CYSTOSCOPY TRANSURETHRAL RESECTION BIOPSY AND FULGURATION OF PROSTATE LESION  (TURP);  Surgeon: Homero Luster, MD;  Location: [facility] ORS;  Service: Urology;  Laterality: N/A;    SOCIAL HISTORY: Social History   Socioeconomic History   Marital status: Married    Spouse name: Not on file   Number of children: 2   Years of education: Not on file   Highest education level: Not on file  Occupational History   Occupation: Commerce Research scientist (life sciences) [employer]   Occupation: Retired  Tobacco Use   Smoking status: Former    Current packs/day: 0.00    Average packs/day: 1.5 packs/day for 60.0 years (90.0 ttl pk-yrs)    Types: Cigarettes    Start date: 07/25/1948    Quit date: 07/25/2008    Years since quitting: 15.3   Smokeless tobacco: Never  Vaping Use   Vaping status: Never Used  Substance and Sexual Activity   Alcohol  use: Never   Drug use: Never   Sexual activity: Not Currently  Other Topics Concern   Not on file  Social History Narrative   Married second time   Retired [employer] n41 yrs   2 daughters and 2 stepdaughters   Social Drivers of Corporate investment banker Strain: Low Risk  (10/19/2022)   Overall Financial Resource Strain (CARDIA)    Difficulty of Paying Living Expenses: Not hard at all  Food Insecurity: No Food Insecurity (10/19/2022)   Hunger Vital Sign    Worried About Running Out of Food in the Last Year: Never true    Ran Out of Food in the Last Year: Never true  Transportation Needs: No Transportation Needs (10/19/2022)   PRAPARE - Scientist, research (physical sciences) (Medical): No    Lack of Transportation (Non-Medical): No  Physical Activity: Insufficiently Active (10/19/2022)   Exercise Vital Sign    Days of Exercise per Week: 4 days    Minutes of Exercise per Session: 30 min  Stress: No Stress Concern Present (10/19/2022)   [employer] of Occupational Health - Occupational Stress Questionnaire    Feeling of Stress : Only a little  Social Connections: Moderately Isolated (10/19/2022)   Social Connection and Isolation Panel    Frequency of Communication with Friends and Family: More than  three times a week    Frequency of Social Gatherings with Friends and Family: More than three times a week    Attends Religious Services: Never    Database administrator or Organizations: No    Attends Banker Meetings: Never    Marital Status: Married  Catering manager Violence: Not At Risk (10/19/2022)   Humiliation, Afraid, Rape, and Kick questionnaire    Fear of Current or Ex-Partner: No    Emotionally Abused: No    Physically Abused: No    Sexually Abused: No    FAMILY HISTORY: Family History  Problem Relation Age of Onset   Heart disease Father    Hypertension Mother    Asthma Paternal Grandmother    Colon cancer Neg Hx    Esophageal cancer Neg Hx    Stomach cancer Neg Hx    Rectal cancer Neg Hx    Liver cancer Neg Hx     ALLERGIES:  is allergic to lipitor [atorvastatin ] and other.  MEDICATIONS:  Current Outpatient Medications  Medication Sig Dispense Refill   acetaminophen  (TYLENOL ) 500 MG tablet Take 500-1,000 mg by mouth daily as needed for mild pain or headache.     ascorbic acid  (VITAMIN C ) 500 MG tablet Take 500 mg by mouth 2 (two) times a week.     carvedilol  (COREG ) 3.125 MG tablet TAKE 1 TABLET BY MOUTH TWICE A DAY WITH A MEAL *TAKE WITH 6.25MG  TO EQUAL 9.375MG  TWICE A DAY* 180 tablet 0   carvedilol  (COREG ) 6.25 MG tablet Take 1 tablet (6.25 mg total) by mouth 2 (two) times daily. Take with 3.125 to equal  9.375 twice daily 180 tablet 0   cefdinir  (OMNICEF ) 300 MG capsule TAKE 1 CAPSULE BY MOUTH 2 TIMES DAILY FOR 7 DAYS. 6 capsule 0   Cholecalciferol  (VITAMIN D -3) 25 MCG (1000 UT) CAPS Take 1,000 Units by mouth daily.     dicyclomine  (BENTYL ) 10 MG capsule TAKE 1 CAPSULE (10 MG TOTAL) BY MOUTH EVERY 6 (SIX) HOURS AS NEEDED (ABDOMINAL PAIN). 30 capsule 1   ELIQUIS  5 MG TABS tablet TAKE 1 TABLET BY MOUTH TWICE A DAY 180 tablet 1   fexofenadine (ALLEGRA) 180 MG tablet Take 180 mg by mouth daily as needed for allergies or rhinitis.      Fluocinolone  Acetonide 0.01 % OIL 3 drops in affected ear(s) once or twice daily as needed for itching     fluticasone  (FLONASE ) 50 MCG/ACT nasal spray Place 2 sprays into both nostrils daily. (Patient taking differently: Place 2 sprays into both nostrils daily as needed for allergies.) 16 g 6   gabapentin  (NEURONTIN ) 300 MG capsule TAKE 1 CAPSULE BY MOUTH THREE TIMES A DAY 270 capsule 1   hydrALAZINE  (APRESOLINE ) 25 MG tablet Take 25 mg by mouth 3 (three) times daily as needed (for bp over 160).     hydrocortisone  (ANUSOL -HC) 2.5 % rectal cream Place 1 application rectally 3 (three) times daily as needed for hemorrhoids. 30 g 1   Hypromellose (ARTIFICIAL TEARS OP) Place 1 drop into both eyes 4 (four) times daily as needed (dry eyes).     ipratropium (ATROVENT ) 0.06 % nasal spray Place 2 sprays into both nostrils 2 (two) times daily. (Patient taking differently: Place 2 sprays into both nostrils 2 (two) times daily as needed for rhinitis.) 45 mL 1   melatonin 5 MG TABS Take 5 mg by mouth at bedtime as needed (sleep).     nitroGLYCERIN  (NITROSTAT ) 0.4 MG SL tablet Place  1 tablet (0.4 mg total) under the tongue every 5 (five) minutes as needed for chest pain. 25 tablet 3   ondansetron  (ZOFRAN ) 8 MG tablet Take 1 tablet (8 mg total) by mouth every 8 (eight) hours as needed for nausea or vomiting. 30 tablet 1   oxyCODONE -acetaminophen  (PERCOCET) 5-325 MG tablet Take 1 tablet by  mouth every 6 (six) hours as needed for severe pain (pain score 7-10). 10 tablet 0   phenazopyridine  (PYRIDIUM ) 200 MG tablet Take one tab the morning of treatment, then up to 3 times a day as needed for pain for 2 days 30 tablet 1   prochlorperazine  (COMPAZINE ) 10 MG tablet Take 1 tablet (10 mg total) by mouth every 6 (six) hours as needed for nausea or vomiting. 30 tablet 1   rosuvastatin  (CRESTOR ) 10 MG tablet TAKE 1 TABLET BY MOUTH EVERY DAY 90 tablet 0   sacubitril -valsartan  (ENTRESTO ) 49-51 MG Take 1 tablet by mouth 2 (two) times daily. 60 tablet 6   sacubitril -valsartan  (ENTRESTO ) 97-103 MG Take 0.5 tablets by mouth 2 (two) times daily.     sodium bicarbonate  650 MG tablet Take 1300 mg (two 650 mg tabs) the night before and morning of therapy. 24 tablet 1   sodium chloride  (OCEAN) 0.65 % SOLN nasal spray Place 1 spray into both nostrils as needed for congestion.     SPIRIVA  RESPIMAT 2.5 MCG/ACT AERS Inhale 2 puffs into the lungs daily as needed (shortness of breath).     spironolactone  (ALDACTONE ) 25 MG tablet Take 1 tablet (25 mg total) by mouth daily. (Patient taking differently: Take 12.5 mg by mouth daily.) 90 tablet 3   triamcinolone cream (KENALOG) 0.1 % Apply 1 application topically daily as needed (to affected areas, for itching).      No current facility-administered medications for this visit.    REVIEW OF SYSTEMS:   All relevant systems were reviewed with the patient and are negative.  PHYSICAL EXAMINATION: ECOG PERFORMANCE STATUS: 1 - Symptomatic but completely ambulatory  Vitals:   11/12/23 1605  BP: (!) 158/111  Pulse: 73  Resp: 14  Temp: (!) 97.5 F (36.4 C)  SpO2: 99%   Filed Weights   11/12/23 1605  Weight: 170 lb 4.8 oz (77.2 kg)    GENERAL: alert, no distress and comfortable SKIN: skin color is normal, no jaundice LUNGS: Effort normal, no respiratory distress.   LABORATORY DATA:  I have reviewed the data pertaining to this visit.

## 2023-11-13 ENCOUNTER — Ambulatory Visit: Attending: Cardiology | Admitting: Student

## 2023-11-13 ENCOUNTER — Other Ambulatory Visit

## 2023-11-13 DIAGNOSIS — Z01810 Encounter for preprocedural cardiovascular examination: Secondary | ICD-10-CM | POA: Diagnosis not present

## 2023-11-13 NOTE — Progress Notes (Signed)
 Virtual Visit via Telephone Note   Because of [name]'s co-morbid illnesses, he is at least at moderate risk for complications without adequate follow up.  This format is felt to be most appropriate for this patient at this time.  The patient did not have access to video technology/had technical difficulties with video requiring transitioning to audio format only (telephone).  All issues noted in this document were discussed and addressed.  No physical exam could be performed with this format.  Please refer to the patient's chart for his consent to telehealth for [facility].  Evaluation Performed:  Preoperative cardiovascular risk assessment _____________   Date:  11/13/2023   Patient ID:  Dillon Young, DOB June 16, 1932, MRN [id number] Patient Location:  Home Provider location:   Office  Primary Care Provider:  Arcadio Knuckles, MD Primary Cardiologist:  Lauro Portal, MD  Chief Complaint / Patient Profile   89 y.o. y/o male with a h/o CAD s/p DES to mid RCA 2017 and DES to mid LAD 2019, permanent A-fib on anticoagulation, HFimpEF, carotid artery disease s/p R CEA June 2013, hypertension, hyperlipidemia, GERD, bladder cancer, prostate cancer, T2DM who is pending occipital nerve block ESI by Dr. Phares Brasher and presents today for telephonic preoperative cardiovascular risk assessment.  History of Present Illness    Dillon Young is a 89 y.o. male who presents via audio/video conferencing for a telehealth visit today.  Pt was last seen in cardiology clinic on 07/31/2023 by Dillon Lee, NP.  At that time Dillon Young was stable from a cardiac standpoint.  The patient is now pending procedure as outlined above. Since his last visit, he is doing well overall. Patient denies shortness of breath, dyspnea on exertion, lower extremity edema, orthopnea or PND. No chest pain, pressure, or tightness. No palpitations.  He is uncertain if he wants to go through with the injection, as  he hs been feeling better. He is also starting chemo on Friday and feels he needs to concentrate on that. He is active walking for 30 minutes 3-4 days a week. Asked about elevated BP yesterday. He states I don't know what was happening there. BP is typically normal at home.   Past Medical History    Past Medical History:  Diagnosis Date   Adenomatous colon polyp    Allergic rhinitis    Anemia    Bladder cancer (HCC)    currently on bcg treatments   BPH (benign prostatic hyperplasia)    CAD (coronary artery disease)    sees Dr. Lauro Portal    Carotid artery disease [facility])    CHF (congestive heart failure) (HCC)    Chronic kidney disease    Sones. Prostate cancer   Clotting disorder (HCC)    CVA (cerebral infarction)    Diverticulitis    Dysrhythmia    atrial fib - hx of    Edema    recent swelling in feet and ankles    Hemorrhoids    History of kidney stones    HTN (hypertension)    Hx of radiation therapy    for prostate - 2013    Hyperlipidemia LDL goal <70    IBS (irritable bowel syndrome)    Low back pain    Pre-diabetes    Prostate cancer (HCC) 2008   seed implantation   Sleep apnea    sitting in recliner helps him to breathe per pt , never used cpap sleep study - pt reports could not go  to sleep- 6 years ago    Smoker    Stroke [facility])    hx of mini strokes years ago    Past Surgical History:  Procedure Laterality Date   APPENDECTOMY     CARDIAC CATHETERIZATION N/A 01/16/2016   Procedure: Left Heart Cath and Coronary Angiography;  Surgeon: Lucendia Rusk, MD;  Location: [facility] INVASIVE CV LAB;  Service: Cardiovascular;  Laterality: N/A;   CARDIAC CATHETERIZATION N/A 01/16/2016   Procedure: Coronary Stent Intervention;  Surgeon: Lucendia Rusk, MD;  Location: [facility] INVASIVE CV LAB;  Service: Cardiovascular;  Laterality: N/A;   CARDIOVERSION N/A 11/26/2017   Procedure: CARDIOVERSION;  Surgeon: Hazle Lites, MD;  Location[facility] ENDOSCOPY;  Service:  Cardiovascular;  Laterality: N/A;   CARDIOVERSION N/A 03/13/2018   Procedure: CARDIOVERSION;  Surgeon: Hazle Lites, MD;  Location: [facility] ENDOSCOPY;  Service: Cardiovascular;  Laterality: N/A;   CARDIOVERSION N/A 07/31/2022   Procedure: CARDIOVERSION;  Surgeon: Lenise Quince, MD;  Location: [facility] ENDOSCOPY;  Service: Cardiovascular;  Laterality: N/A;   CARDIOVERSION N/A 09/10/2022   Procedure: CARDIOVERSION;  Surgeon: Elmyra Haggard, MD;  Location: [facility] INVASIVE CV LAB;  Service: Cardiovascular;  Laterality: N/A;   CATARACT EXTRACTION W/ INTRAOCULAR LENS  IMPLANT, BILATERAL Bilateral    COLONOSCOPY  10/21/2008   diverticulosis, radiation proctitis, internal hemorrhoids   CORONARY ANGIOPLASTY     CORONARY STENT INTERVENTION N/A 12/26/2017   Procedure: CORONARY STENT INTERVENTION;  Surgeon: Avanell Leigh, MD;  Location: [facility] INVASIVE CV LAB;  Service: Cardiovascular;  Laterality: N/A;   CYSTOSCOPY WITH BIOPSY N/A 09/10/2023   Procedure: CYSTOSCOPY, WITH BIOPSY/BILATERAL RETROGRADE;  Surgeon: Homero Luster, MD;  Location: [facility] ORS;  Service: Urology;  Laterality: N/A;  BILATERAL RETROGRADE   ENDARTERECTOMY  11/12/2011   Procedure: ENDARTERECTOMY CAROTID;  Surgeon: Mayo Speck, MD;  [facility] OR;  Service: Vascular;  Laterality: Right;   ESOPHAGOGASTRODUODENOSCOPY (EGD) WITH ESOPHAGEAL DILATION     HEMORRHOID BANDING     LEFT HEART CATH AND CORONARY ANGIOGRAPHY N/A 12/26/2017   Procedure: LEFT HEART CATH AND CORONARY ANGIOGRAPHY;  Surgeon: Avanell Leigh, MD;  Location: [facility] INVASIVE CV LAB;  Service: Cardiovascular;  Laterality: N/A;   LITHOTRIPSY  X 3   LUMBAR LAMINECTOMY     TRANSURETHRAL RESECTION OF BLADDER TUMOR N/A 10/15/2023   Procedure: TURBT (TRANSURETHRAL RESECTION OF BLADDER TUMOR);  Surgeon: Homero Luster, MD;  Location: [facility] ORS;  Service: Urology;  Laterality: N/A;   TRANSURETHRAL RESECTION OF BLADDER TUMOR WITH MITOMYCIN -C Bilateral 01/06/2021   Procedure: TRANSURETHRAL RESECTION OF BLADDER  TUMOR WITH GEMCITABINE  BILATERAL RETROGRADES;  Surgeon: Homero Luster, MD;  Location: [facility] ORS;  Service: Urology;  Laterality: Bilateral;   TRANSURETHRAL RESECTION OF PROSTATE  10/2003   TRANSURETHRAL RESECTION OF PROSTATE N/A 05/31/2020   Procedure: CYSTOSCOPY TRANSURETHRAL RESECTION BIOPSY AND FULGURATION OF PROSTATE LESION  (TURP);  Surgeon: Homero Luster, MD;  Location: [facility] ORS;  Service: Urology;  Laterality: N/A;    Allergies  Allergies  Allergen Reactions   Lipitor [Atorvastatin ] Other (See Comments)    Muscle pain   Other Rash and Other (See Comments)    Detergent- Rashes on the skin    Home Medications    Prior to Admission medications   Medication Sig Start Date End Date Taking? Authorizing Provider  acetaminophen  (TYLENOL ) 500 MG tablet Take 500-1,000 mg by mouth daily as needed for mild pain or headache.    [provider]  ascorbic acid  (VITAMIN C ) 500 MG tablet Take 500 mg  by mouth 2 (two) times a week.    [provider]  carvedilol  (COREG ) 3.125 MG tablet TAKE 1 TABLET BY MOUTH TWICE A DAY WITH A MEAL *TAKE WITH 6.25MG  TO EQUAL 9.375MG  TWICE A DAY* 10/09/23   Arcadio Knuckles, MD  carvedilol  (COREG ) 6.25 MG tablet Take 1 tablet (6.25 mg total) by mouth 2 (two) times daily. Take with 3.125 to equal 9.375 twice daily 10/10/23   Arcadio Knuckles, MD  cefdinir  (OMNICEF ) 300 MG capsule TAKE 1 CAPSULE BY MOUTH 2 TIMES DAILY FOR 7 DAYS. 10/16/23   Homero Luster, MD  Cholecalciferol  (VITAMIN D -3) 25 MCG (1000 UT) CAPS Take 1,000 Units by mouth daily.    [provider]  dicyclomine  (BENTYL ) 10 MG capsule TAKE 1 CAPSULE (10 MG TOTAL) BY MOUTH EVERY 6 (SIX) HOURS AS NEEDED (ABDOMINAL PAIN). 03/16/22   Esterwood, Amy S, PA-C  ELIQUIS  5 MG TABS tablet TAKE 1 TABLET BY MOUTH TWICE A DAY 06/10/23   Avanell Leigh, MD  fexofenadine (ALLEGRA) 180 MG tablet Take 180 mg by mouth daily as needed for allergies or rhinitis.     [provider]  Fluocinolone  Acetonide  0.01 % OIL 3 drops in affected ear(s) once or twice daily as needed for itching 07/09/23   [provider]  fluticasone  (FLONASE ) 50 MCG/ACT nasal spray Place 2 sprays into both nostrils daily. Patient taking differently: Place 2 sprays into both nostrils daily as needed for allergies. 12/20/22   Wilfredo Hanly, MD  gabapentin  (NEURONTIN ) 300 MG capsule TAKE 1 CAPSULE BY MOUTH THREE TIMES A DAY 10/08/23   Arcadio Knuckles, MD  hydrALAZINE  (APRESOLINE ) 25 MG tablet Take 25 mg by mouth 3 (three) times daily as needed (for bp over 160).    [provider]  hydrocortisone  (ANUSOL -HC) 2.5 % rectal cream Place 1 application rectally 3 (three) times daily as needed for hemorrhoids. 02/21/21   Kenney Peacemaker, MD  Hypromellose (ARTIFICIAL TEARS OP) Place 1 drop into both eyes 4 (four) times daily as needed (dry eyes).    [provider]  ipratropium (ATROVENT ) 0.06 % nasal spray Place 2 sprays into both nostrils 2 (two) times daily. Patient taking differently: Place 2 sprays into both nostrils 2 (two) times daily as needed for rhinitis. 12/20/22   Wilfredo Hanly, MD  melatonin 5 MG TABS Take 5 mg by mouth at bedtime as needed (sleep).    [provider]  nitroGLYCERIN  (NITROSTAT ) 0.4 MG SL tablet Place 1 tablet (0.4 mg total) under the tongue every 5 (five) minutes as needed for chest pain. 10/18/21   Darlis Eisenmenger, MD  ondansetron  (ZOFRAN ) 8 MG tablet Take 1 tablet (8 mg total) by mouth every 8 (eight) hours as needed for nausea or vomiting. 10/30/23   Lowanda Ruddy, MD  oxyCODONE -acetaminophen  (PERCOCET) 5-325 MG tablet Take 1 tablet by mouth every 6 (six) hours as needed for severe pain (pain score 7-10). 10/15/23 10/14/24  Homero Luster, MD  phenazopyridine  (PYRIDIUM ) 200 MG tablet Take one tab the morning of treatment, then up to 3 times a day as needed for pain for 2 days 10/30/23   Lowanda Ruddy, MD  prochlorperazine  (COMPAZINE ) 10 MG tablet Take 1 tablet (10 mg  total) by mouth every 6 (six) hours as needed for nausea or vomiting. 10/30/23   Lowanda Ruddy, MD  rosuvastatin  (CRESTOR ) 10 MG tablet TAKE 1 TABLET BY MOUTH EVERY DAY 11/13/23   Arcadio Knuckles, MD  sacubitril -valsartan  (ENTRESTO ) 49-51 MG Take 1 tablet by mouth 2 (two) times daily. 07/31/23   Young, [place], NP  sacubitril -valsartan  (ENTRESTO ) 97-103 MG Take 0.5 tablets by mouth 2 (two) times daily.    [provider]  sodium bicarbonate  650 MG tablet Take 1300 mg (two 650 mg tabs) the night before and morning of therapy. 10/30/23   Lowanda Ruddy, MD  sodium chloride  (OCEAN) 0.65 % SOLN nasal spray Place 1 spray into both nostrils as needed for congestion.    [provider]  SPIRIVA  RESPIMAT 2.5 MCG/ACT AERS Inhale 2 puffs into the lungs daily as needed (shortness of breath). 10/04/23   [provider]  spironolactone  (ALDACTONE ) 25 MG tablet Take 1 tablet (25 mg total) by mouth daily. Patient taking differently: Take 12.5 mg by mouth daily. 01/14/23 11/08/23  Tania Familia, NP  triamcinolone cream (KENALOG) 0.1 % Apply 1 application topically daily as needed (to affected areas, for itching).  01/06/19   [provider]    Physical Exam    Vital Signs:  Dillon Young does not have vital signs available for review today.  Given telephonic nature of communication, physical exam is limited. AAOx3. NAD. Normal affect.  Speech and respirations are unlabored.   Assessment & Plan    Primary Cardiologist: Lauro Portal, MD  Preoperative cardiovascular risk assessment.  Occipital nerve block ESI by Dr. Phares Brasher.  Chart reviewed as part of pre-operative protocol coverage. According to the RCRI, patient has a 11-15% risk of MACE. Patient reports activity equivalent to 4.0 METS (walks 30 minutes 3-4 days a week).   Given past medical history and time since last visit, based on ACC/AHA guidelines, Dillon Young would be at acceptable risk for the planned  procedure without further cardiovascular testing.   Patient was advised that if he develops new symptoms prior to surgery to contact our office to arrange a follow-up appointment.  he verbalized understanding.  Per Pharm D, patient has not had an Afib/aflutter ablation within the last 3 months or DCCV within the last 30 days. Patient may hold Eliquis  for 3 days prior to procedure.  Patient will not need bridging with Lovenox around procedure.    I will route this recommendation to the requesting party via Epic fax function.  Please call with questions.  Time:   Today, I have spent 5 minutes with the patient with telehealth technology discussing medical history, symptoms, and management plan.     Morey Ar, NP  11/13/2023, 10:13 AM

## 2023-11-14 ENCOUNTER — Ambulatory Visit

## 2023-11-14 ENCOUNTER — Other Ambulatory Visit

## 2023-11-14 NOTE — Telephone Encounter (Signed)
 Requesting office sent duplicate request inquiring if pt has been cleared.   Pt had tele preop appt 11/13/23 with Morey Ar, NP and was cleared.   I will re-fax her notes providing clearance and recommendations for blood thinner.

## 2023-11-15 ENCOUNTER — Inpatient Hospital Stay

## 2023-11-15 ENCOUNTER — Other Ambulatory Visit: Payer: Self-pay | Admitting: Physician Assistant

## 2023-11-15 VITALS — BP 148/99 | HR 77 | Resp 16 | Wt 167.5 lb

## 2023-11-15 DIAGNOSIS — C678 Malignant neoplasm of overlapping sites of bladder: Secondary | ICD-10-CM

## 2023-11-15 DIAGNOSIS — Z5111 Encounter for antineoplastic chemotherapy: Secondary | ICD-10-CM | POA: Diagnosis not present

## 2023-11-15 MED ORDER — OXYBUTYNIN CHLORIDE 5 MG PO TABS
5.0000 mg | ORAL_TABLET | Freq: Once | ORAL | Status: AC
  Administered 2023-11-15: 5 mg via ORAL
  Filled 2023-11-15: qty 1

## 2023-11-15 MED ORDER — PROCHLORPERAZINE MALEATE 10 MG PO TABS
10.0000 mg | ORAL_TABLET | Freq: Once | ORAL | Status: AC
  Administered 2023-11-15: 10 mg via ORAL
  Filled 2023-11-15: qty 1

## 2023-11-15 MED ORDER — LIDOCAINE HCL URETHRAL/MUCOSAL 2 % EX GEL
1.0000 | Freq: Once | CUTANEOUS | Status: DC
  Filled 2023-11-15: qty 1

## 2023-11-15 MED ORDER — SODIUM CHLORIDE (PF) 0.9 % IJ SOLN
37.5000 mg | Freq: Once | INTRAVENOUS | Status: AC
  Administered 2023-11-15: 37.5 mg via INTRAVESICAL
  Filled 2023-11-15: qty 3.75

## 2023-11-15 MED ORDER — SODIUM CHLORIDE (PF) 0.9 % IJ SOLN
1000.0000 mg | Freq: Once | INTRAVENOUS | Status: AC
  Administered 2023-11-15: 1000 mg via INTRAVESICAL
  Filled 2023-11-15: qty 26.3

## 2023-11-15 NOTE — Progress Notes (Signed)
 67F latex foley catheter inserted without difficulty prior to chemo administration. Lidocaine  not used. Stat lock not used. Pt tolerated well. Pt states he took Pyridium  200mg  1 hour prior to arrival. Pt able to complete both gemzar  and taxotere for 60 min each without any leaking, pain or cramping. Pt discharged in stable condition and ambulated to lobby with wife.

## 2023-11-15 NOTE — Patient Instructions (Signed)
 [facility] - A DEPT OF [facility]  Discharge Instructions: Thank you for choosing [facility] to provide your oncology and hematology care.   If you have a lab appointment with the Cancer Center, please go directly to the Cancer Center and check in at the registration area.   Wear comfortable clothing and clothing appropriate for easy access to any Portacath or PICC line.   We strive to give you quality time with your provider. You may need to reschedule your appointment if you arrive late (15 or more minutes).  Arriving late affects you and other patients whose appointments are after yours.  Also, if you miss three or more appointments without notifying the office, you may be dismissed from the clinic at the provider's discretion.      For prescription refill requests, have your pharmacy contact our office and allow 72 hours for refills to be completed.    Today you received the following chemotherapy and/or immunotherapy agents gemcitabine , docetaxel      To help prevent nausea and vomiting after your treatment, we encourage you to take your nausea medication as directed.  BELOW ARE SYMPTOMS THAT SHOULD BE REPORTED IMMEDIATELY: *FEVER GREATER THAN 100.4 F (38 C) OR HIGHER *CHILLS OR SWEATING *NAUSEA AND VOMITING THAT IS NOT CONTROLLED WITH YOUR NAUSEA MEDICATION *UNUSUAL SHORTNESS OF BREATH *UNUSUAL BRUISING OR BLEEDING *URINARY PROBLEMS (pain or burning when urinating, or frequent urination) *BOWEL PROBLEMS (unusual diarrhea, constipation, pain near the anus) TENDERNESS IN MOUTH AND THROAT WITH OR WITHOUT PRESENCE OF ULCERS (sore throat, sores in mouth, or a toothache) UNUSUAL RASH, SWELLING OR PAIN  UNUSUAL VAGINAL DISCHARGE OR ITCHING   Items with * indicate a potential emergency and should be followed up as soon as possible or go to the Emergency Department if any problems should occur.  Please show the CHEMOTHERAPY ALERT CARD or  IMMUNOTHERAPY ALERT CARD at check-in to the Emergency Department and triage nurse.  Should you have questions after your visit or need to cancel or reschedule your appointment, please contact [facility] - A DEPT OF [facility]  Dept: [phone]  and follow the prompts.  Office hours are 8:00 a.m. to 4:30 p.m. Monday - Friday. Please note that voicemails left after 4:00 p.m. may not be returned until the following business day.  We are closed weekends and major holidays. You have access to a nurse at all times for urgent questions. Please call the main number to the clinic Dept: [phone] and follow the prompts.   For any non-urgent questions, you may also contact your provider using MyChart. We now offer e-Visits for anyone 75 and older to request care online for non-urgent symptoms. For details visit [email or web address].   Also download the MyChart app! Go to the app store, search MyChart, open the app, select [facility], and log in with your MyChart username and password.

## 2023-11-18 ENCOUNTER — Telehealth: Payer: Self-pay | Admitting: *Deleted

## 2023-11-18 ENCOUNTER — Other Ambulatory Visit: Payer: Self-pay

## 2023-11-18 DIAGNOSIS — C678 Malignant neoplasm of overlapping sites of bladder: Secondary | ICD-10-CM

## 2023-11-18 NOTE — Telephone Encounter (Signed)
-----   Message from Nurse Kerri GAILS sent at 11/15/2023  1:48 PM EDT ----- Regarding: FT CHANG chemo Pt able to complete bladder chemo (gemzar  & taxotere ) without any issues! Please call home phone.

## 2023-11-18 NOTE — Telephone Encounter (Signed)
 Called pt to see how he did with his recent bladder treatment. Left message to return call to his MD/RN.

## 2023-11-22 ENCOUNTER — Other Ambulatory Visit

## 2023-11-22 ENCOUNTER — Inpatient Hospital Stay: Admitting: Physician Assistant

## 2023-11-22 ENCOUNTER — Other Ambulatory Visit: Payer: Self-pay | Admitting: *Deleted

## 2023-11-22 ENCOUNTER — Other Ambulatory Visit: Payer: Self-pay

## 2023-11-22 ENCOUNTER — Inpatient Hospital Stay

## 2023-11-22 VITALS — BP 158/89 | HR 66 | Temp 97.5°F | Resp 17 | Wt 170.7 lb

## 2023-11-22 VITALS — BP 140/82 | HR 66 | Temp 98.4°F | Resp 16

## 2023-11-22 DIAGNOSIS — Z5111 Encounter for antineoplastic chemotherapy: Secondary | ICD-10-CM | POA: Diagnosis not present

## 2023-11-22 DIAGNOSIS — N1832 Chronic kidney disease, stage 3b: Secondary | ICD-10-CM

## 2023-11-22 DIAGNOSIS — C678 Malignant neoplasm of overlapping sites of bladder: Secondary | ICD-10-CM

## 2023-11-22 LAB — URINALYSIS, COMPLETE (UACMP) WITH MICROSCOPIC
Bacteria, UA: NONE SEEN
Bilirubin Urine: NEGATIVE
Glucose, UA: NEGATIVE mg/dL
Hgb urine dipstick: NEGATIVE
Ketones, ur: NEGATIVE mg/dL
Leukocytes,Ua: NEGATIVE
Nitrite: POSITIVE — AB
Protein, ur: NEGATIVE mg/dL
Specific Gravity, Urine: 1.006 (ref 1.005–1.030)
pH: 8 (ref 5.0–8.0)

## 2023-11-22 MED ORDER — SULFAMETHOXAZOLE-TRIMETHOPRIM 800-160 MG PO TABS: 1.0000 | ORAL_TABLET | Freq: Two times a day (BID) | ORAL | 0 refills | Status: DC

## 2023-11-22 MED ORDER — PROCHLORPERAZINE MALEATE 10 MG PO TABS
10.0000 mg | ORAL_TABLET | Freq: Once | ORAL | Status: AC
  Administered 2023-11-22: 10 mg via ORAL
  Filled 2023-11-22: qty 1

## 2023-11-22 MED ORDER — LIDOCAINE HCL URETHRAL/MUCOSAL 2 % EX GEL
1.0000 | Freq: Once | CUTANEOUS | Status: DC
  Filled 2023-11-22: qty 1

## 2023-11-22 MED ORDER — SODIUM CHLORIDE (PF) 0.9 % IJ SOLN
37.5000 mg | Freq: Once | INTRAVENOUS | Status: AC
  Administered 2023-11-22: 37.5 mg via INTRAVESICAL
  Filled 2023-11-22: qty 3.75

## 2023-11-22 MED ORDER — SODIUM CHLORIDE 0.9 % IV SOLN: INTRAVENOUS | Status: DC

## 2023-11-22 MED ORDER — SODIUM CHLORIDE (PF) 0.9 % IJ SOLN
1000.0000 mg | Freq: Once | INTRAVENOUS | Status: AC
  Administered 2023-11-22: 1000 mg via INTRAVESICAL
  Filled 2023-11-22: qty 26.3

## 2023-11-22 MED ORDER — OXYBUTYNIN CHLORIDE 5 MG PO TABS
5.0000 mg | ORAL_TABLET | Freq: Once | ORAL | Status: AC
  Administered 2023-11-22: 5 mg via ORAL
  Filled 2023-11-22: qty 1

## 2023-11-22 NOTE — Progress Notes (Addendum)
 [facility] [phone]   [phone]  PROGRESS NOTE  Patient Care Team: [name], MD as PCP - General (Internal Medicine) [name], MD as PCP - Cardiology (Cardiology) Rozella [name], [facility] (Inactive) (Pharmacist)   CHIEF COMPLAINTS/PURPOSE OF CONSULTATION:  High grade urothelial carcinoma   Oncology History  Bladder cancer [facility])  05/31/2020 Pathology Results   A. PROSTATE AND PROSTATE URETHRA CHIPS, TURP:  High grade urothelial carcinoma  The carcinoma focally invades laminar propria  Muscularis propria (detrusor muscle) is present and not involves  Prostatic glands is not present    01/06/2021 Pathology Results   A. BLADDER, WALL, BIOPSY:  -  High-grade urothelial carcinoma, noninvasive  -  Detrusor muscle present but uninvolved by carcinoma   B. BLADDER, LEFT LATERAL WALL, BIOPSY:  -  High-grade urothelial carcinoma, noninvasive  -  No detrusor muscle identified    2022 - 2024 Chemotherapy   BCG Induction and maintenance.   01/10/2022 Initial Diagnosis   Bladder cancer (HCC)   09/20/2023 Pathology Results   BLADDER WALL, RIGHT LATERAL, BIOPSY:  High grade urothelial carcinoma  The carcinoma focally invades laminar propria  Muscularis propria (detrusor muscle) is not present    10/15/2023 Pathology Results   BLADDER TUMOR, TURBT:  Small fragments of high-grade urothelial carcinoma  Submucosa and muscularis propria with chronic inflammation  Negative for invasive carcinoma    10/29/2023 Cancer Staging   Staging form: Urinary Bladder, AJCC 8th Edition - Clinical: Stage I (cT1, cN0, cM0) - Signed by Tina [name], MD on 10/29/2023 WHO/ISUP grade (low/high): High Grade Histologic grading system: 2 grade system   11/15/2023 -  Chemotherapy   Patient is on Treatment Plan : BLADDER Gemcitabine  (1000), Docetaxel  (37.5) INTRAVESICAL q7d        HISTORY OF PRESENTING ILLNESS:  Dillon Young 89 y.o. male returns for  a toxicity check prior to Cycle 1, Day 8 of intravesical Gemcitabine /Docetaxel . He is unaccompanied for this visit.   On exam today, Dillon Young reports he tolerated treatment without any toxicities. His energy and appetite are fairly stable. He is able to complete his baseline ADLs independently. He denies nausea, vomiting or bowel habit changes. He denies any abdominal pain. He denies easy bruising or signs of active bleeding including hematuria. He is reporting some urgency and foul smelling odor.  He denies fevers, chills, sweats, shortness of breath, chest pain or cough. He has no other complaints. Rest of the ROS is below.   MEDICAL HISTORY:  Past Medical History:  Diagnosis Date   Adenomatous colon polyp    Allergic rhinitis    Anemia    Bladder cancer (HCC)    currently on bcg treatments   BPH (benign prostatic hyperplasia)    CAD (coronary artery disease)    sees Dr. Dorn Court    Carotid artery disease [facility])    CHF (congestive heart failure) (HCC)    Chronic kidney disease    Sones. Prostate cancer   Clotting disorder (HCC)    CVA (cerebral infarction)    Diverticulitis    Dysrhythmia    atrial fib - hx of    Edema    recent swelling in feet and ankles    Hemorrhoids    History of kidney stones    HTN (hypertension)    Hx of radiation therapy    for prostate - 2013    Hyperlipidemia LDL goal <70    IBS (irritable bowel syndrome)  Low back pain    Pre-diabetes    Prostate cancer (HCC) 2008   seed implantation   Sleep apnea    sitting in recliner helps him to breathe per pt , never used cpap sleep study - pt reports could not go to sleep- 6 years ago    Smoker    Stroke [facility])    hx of mini strokes years ago     SURGICAL HISTORY: Past Surgical History:  Procedure Laterality Date   APPENDECTOMY     CARDIAC CATHETERIZATION N/A 01/16/2016   Procedure: Left Heart Cath and Coronary Angiography;  Surgeon: Candyce GORMAN Reek, MD;  Location: [facility] INVASIVE CV LAB;   Service: Cardiovascular;  Laterality: N/A;   CARDIAC CATHETERIZATION N/A 01/16/2016   Procedure: Coronary Stent Intervention;  Surgeon: Candyce GORMAN Reek, MD;  [facility];  Service: Cardiovascular;  Laterality: N/A;   CARDIOVERSION N/A 11/26/2017   Procedure: CARDIOVERSION;  Surgeon: Mona Vinie BROCKS, MD;  Location: [facility] ENDOSCOPY;  Service: Cardiovascular;  Laterality: N/A;   CARDIOVERSION N/A 03/13/2018   Procedure: CARDIOVERSION;  Surgeon: Mona Vinie BROCKS, MD;  Location: [facility] ENDOSCOPY;  Service: Cardiovascular;  Laterality: N/A;   CARDIOVERSION N/A 07/31/2022   Procedure: CARDIOVERSION;  Surgeon: Pietro Redell GORMAN, MD;  Location: [facility] ENDOSCOPY;  Service: Cardiovascular;  Laterality: N/A;   CARDIOVERSION N/A 09/10/2022   Procedure: CARDIOVERSION;  Surgeon: Okey Vina GAILS, MD;  Location: [facility] INVASIVE CV LAB;  Service: Cardiovascular;  Laterality: N/A;   CATARACT EXTRACTION W/ INTRAOCULAR LENS  IMPLANT, BILATERAL Bilateral    COLONOSCOPY  10/21/2008   diverticulosis, radiation proctitis, internal hemorrhoids   CORONARY ANGIOPLASTY     CORONARY STENT INTERVENTION N/A 12/26/2017   Procedure: CORONARY STENT INTERVENTION;  Surgeon: [name], MD;  Location: [facility] INVASIVE CV LAB;  Service: Cardiovascular;  Laterality: N/A;   CYSTOSCOPY WITH BIOPSY N/A 09/10/2023   Procedure: CYSTOSCOPY, WITH BIOPSY/BILATERAL RETROGRADE;  Surgeon: Watt Rush, MD;  Location: [facility] ORS;  Service: Urology;  Laterality: N/A;  BILATERAL RETROGRADE   ENDARTERECTOMY  11/12/2011   Procedure: ENDARTERECTOMY CAROTID;  Surgeon: Krystal JULIANNA Doing, MD;  Location: [facility] OR;  Service: Vascular;  Laterality: Right;   ESOPHAGOGASTRODUODENOSCOPY (EGD) WITH ESOPHAGEAL DILATION     HEMORRHOID BANDING     LEFT HEART CATH AND CORONARY ANGIOGRAPHY N/A 12/26/2017   Procedure: LEFT HEART CATH AND CORONARY ANGIOGRAPHY;  Surgeon: [name], MD;  Location: [facility] INVASIVE CV LAB;  Service: Cardiovascular;  Laterality: N/A;   LITHOTRIPSY  X  3   LUMBAR LAMINECTOMY     TRANSURETHRAL RESECTION OF BLADDER TUMOR N/A 10/15/2023   Procedure: TURBT (TRANSURETHRAL RESECTION OF BLADDER TUMOR);  Surgeon: Watt Rush, MD;  Location: [facility] ORS;  Service: Urology;  Laterality: N/A;   TRANSURETHRAL RESECTION OF BLADDER TUMOR WITH MITOMYCIN -C Bilateral 01/06/2021   Procedure: TRANSURETHRAL RESECTION OF BLADDER TUMOR WITH GEMCITABINE  BILATERAL RETROGRADES;  Surgeon: Watt Rush, MD;  Location: [facility] ORS;  Service: Urology;  Laterality: Bilateral;   TRANSURETHRAL RESECTION OF PROSTATE  10/2003   TRANSURETHRAL RESECTION OF PROSTATE N/A 05/31/2020   Procedure: CYSTOSCOPY TRANSURETHRAL RESECTION BIOPSY AND FULGURATION OF PROSTATE LESION  (TURP);  Surgeon: Watt Rush, MD;  Location: [facility] ORS;  Service: Urology;  Laterality: N/A;    SOCIAL HISTORY: Social History   Socioeconomic History   Marital status: Married    Spouse name: Not on file   Number of children: 2   Years of education: Not on file   Highest education level: Not on file  Occupational History   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   Occupation: Retired  Tobacco Use   Smoking status: Former    Current packs/day: 0.00    Average packs/day: 1.5 packs/day for 60.0 years (90.0 ttl pk-yrs)    Types: Cigarettes    Start date: 07/25/1948    Quit date: 07/25/2008    Years since quitting: 15.3   Smokeless tobacco: Never  Vaping Use   Vaping status: Never Used  Substance and Sexual Activity   Alcohol  use: Never   Drug use: Never   Sexual activity: Not Currently  Other Topics Concern   Not on file  Social History Narrative   Married second time   Retired [employer] n41 yrs   2 daughters and 2 stepdaughters   Social Drivers of Corporate investment banker Strain: Low Risk  (10/19/2022)   Overall Financial Resource Strain (CARDIA)    Difficulty of Paying Living Expenses: Not hard at all  Food Insecurity: No Food Insecurity (10/19/2022)   Hunger Vital Sign    Worried About  Running Out of Food in the Last Year: Never true    Ran Out of Food in the Last Year: Never true  Transportation Needs: No Transportation Needs (10/19/2022)   PRAPARE - Administrator, Civil Service (Medical): No    Lack of Transportation (Non-Medical): No  Physical Activity: Insufficiently Active (10/19/2022)   Exercise Vital Sign    Days of Exercise per Week: 4 days    Minutes of Exercise per Session: 30 min  Stress: No Stress Concern Present (10/19/2022)   [employer] of Occupational Health - Occupational Stress Questionnaire    Feeling of Stress : Only a little  Social Connections: Moderately Isolated (10/19/2022)   Social Connection and Isolation Panel    Frequency of Communication with Friends and Family: More than three times a week    Frequency of Social Gatherings with Friends and Family: More than three times a week    Attends Religious Services: Never    Database administrator or Organizations: No    Attends Banker Meetings: Never    Marital Status: Married  Catering manager Violence: Not At Risk (10/19/2022)   Humiliation, Afraid, Rape, and Kick questionnaire    Fear of Current or Ex-Partner: No    Emotionally Abused: No    Physically Abused: No    Sexually Abused: No    FAMILY HISTORY: Family History  Problem Relation Age of Onset   Heart disease Father    Hypertension Mother    Asthma Paternal Grandmother    Colon cancer Neg Hx    Esophageal cancer Neg Hx    Stomach cancer Neg Hx    Rectal cancer Neg Hx    Liver cancer Neg Hx     ALLERGIES:  is allergic to lipitor [atorvastatin ] and other.  MEDICATIONS:  Current Outpatient Medications  Medication Sig Dispense Refill   acetaminophen  (TYLENOL ) 500 MG tablet Take 500-1,000 mg by mouth daily as needed for mild pain or headache.     ascorbic acid  (VITAMIN C ) 500 MG tablet Take 500 mg by mouth 2 (two) times a week.     carvedilol  (COREG ) 3.125 MG tablet TAKE 1 TABLET BY MOUTH TWICE A  DAY WITH A MEAL *TAKE WITH 6.25MG  TO EQUAL 9.375MG  TWICE A DAY* 180 tablet 0   carvedilol  (COREG ) 6.25 MG tablet Take 1 tablet (6.25 mg total) by mouth 2 (two) times daily. Take with 3.125 to  equal 9.375 twice daily 180 tablet 0   cefdinir  (OMNICEF ) 300 MG capsule TAKE 1 CAPSULE BY MOUTH 2 TIMES DAILY FOR 7 DAYS. 6 capsule 0   Cholecalciferol  (VITAMIN D -3) 25 MCG (1000 UT) CAPS Take 1,000 Units by mouth daily.     dicyclomine  (BENTYL ) 10 MG capsule TAKE 1 CAPSULE (10 MG TOTAL) BY MOUTH EVERY 6 (SIX) HOURS AS NEEDED (ABDOMINAL PAIN). 30 capsule 1   ELIQUIS  5 MG TABS tablet TAKE 1 TABLET BY MOUTH TWICE A DAY 180 tablet 1   fexofenadine (ALLEGRA) 180 MG tablet Take 180 mg by mouth daily as needed for allergies or rhinitis.      Fluocinolone  Acetonide 0.01 % OIL 3 drops in affected ear(s) once or twice daily as needed for itching     fluticasone  (FLONASE ) 50 MCG/ACT nasal spray Place 2 sprays into both nostrils daily. (Patient taking differently: Place 2 sprays into both nostrils daily as needed for allergies.) 16 g 6   gabapentin  (NEURONTIN ) 300 MG capsule TAKE 1 CAPSULE BY MOUTH THREE TIMES A DAY 270 capsule 1   hydrALAZINE  (APRESOLINE ) 25 MG tablet Take 25 mg by mouth 3 (three) times daily as needed (for bp over 160).     hydrocortisone  (ANUSOL -HC) 2.5 % rectal cream Place 1 application rectally 3 (three) times daily as needed for hemorrhoids. 30 g 1   Hypromellose (ARTIFICIAL TEARS OP) Place 1 drop into both eyes 4 (four) times daily as needed (dry eyes).     ipratropium (ATROVENT ) 0.06 % nasal spray Place 2 sprays into both nostrils 2 (two) times daily. (Patient taking differently: Place 2 sprays into both nostrils 2 (two) times daily as needed for rhinitis.) 45 mL 1   melatonin 5 MG TABS Take 5 mg by mouth at bedtime as needed (sleep).     nitroGLYCERIN  (NITROSTAT ) 0.4 MG SL tablet Place 1 tablet (0.4 mg total) under the tongue every 5 (five) minutes as needed for chest pain. 25 tablet 3    ondansetron  (ZOFRAN ) 8 MG tablet Take 1 tablet (8 mg total) by mouth every 8 (eight) hours as needed for nausea or vomiting. 30 tablet 1   oxyCODONE -acetaminophen  (PERCOCET) 5-325 MG tablet Take 1 tablet by mouth every 6 (six) hours as needed for severe pain (pain score 7-10). 10 tablet 0   phenazopyridine  (PYRIDIUM ) 200 MG tablet Take one tab the morning of treatment, then up to 3 times a day as needed for pain for 2 days 30 tablet 1   prochlorperazine  (COMPAZINE ) 10 MG tablet Take 1 tablet (10 mg total) by mouth every 6 (six) hours as needed for nausea or vomiting. 30 tablet 1   rosuvastatin  (CRESTOR ) 10 MG tablet TAKE 1 TABLET BY MOUTH EVERY DAY 90 tablet 0   sacubitril -valsartan  (ENTRESTO ) 49-51 MG Take 1 tablet by mouth 2 (two) times daily. 60 tablet 6   sacubitril -valsartan  (ENTRESTO ) 97-103 MG Take 0.5 tablets by mouth 2 (two) times daily.     sodium bicarbonate  650 MG tablet Take 1300 mg (two 650 mg tabs) the night before and morning of therapy. 24 tablet 1   sodium chloride  (OCEAN) 0.65 % SOLN nasal spray Place 1 spray into both nostrils as needed for congestion.     SPIRIVA  RESPIMAT 2.5 MCG/ACT AERS Inhale 2 puffs into the lungs daily as needed (shortness of breath).     triamcinolone cream (KENALOG) 0.1 % Apply 1 application topically daily as needed (to affected areas, for itching).      spironolactone  (ALDACTONE ) 25  MG tablet Take 1 tablet (25 mg total) by mouth daily. (Patient not taking: Reported on 11/22/2023) 90 tablet 3   No current facility-administered medications for this visit.    REVIEW OF SYSTEMS:   Constitutional: ( - ) fevers, ( - )  chills , ( - ) night sweats Eyes: ( - ) blurriness of vision, ( - ) double vision, ( - ) watery eyes Ears, nose, mouth, throat, and face: ( - ) mucositis, ( - ) sore throat Respiratory: ( - ) cough, ( - ) dyspnea, ( - ) wheezes Cardiovascular: ( - ) palpitation, ( - ) chest discomfort, ( - ) lower extremity swelling Gastrointestinal:  ( - )  nausea, ( - ) heartburn, ( - ) change in bowel habits Skin: ( - ) abnormal skin rashes Lymphatics: ( - ) new lymphadenopathy, ( - ) easy bruising Neurological: ( - ) numbness, ( - ) tingling, ( - ) new weaknesses Behavioral/Psych: ( - ) mood change, ( - ) new changes  All other systems were reviewed with the patient and are negative.  PHYSICAL EXAMINATION: ECOG PERFORMANCE STATUS: 0 - Asymptomatic  Vitals:   11/22/23 0837  BP: (!) 158/89  Pulse: 66  Resp: 17  Temp: (!) 97.5 F (36.4 C)  SpO2: 95%   Filed Weights   11/22/23 0837  Weight: 170 lb 11.2 oz (77.4 kg)    GENERAL: well appearing male in NAD  SKIN: skin color, texture, turgor are normal, no rashes or significant lesions EYES: conjunctiva are pink and non-injected, sclera clear.  LUNGS: clear to auscultation and percussion with normal breathing effort HEART: regular rate & rhythm and no murmurs. Trace bilateral ankle edema Musculoskeletal: no cyanosis of digits and no clubbing  PSYCH: alert & oriented x 3, fluent speech NEURO: no focal motor/sensory deficits  LABORATORY DATA:  I have reviewed the data as listed    Latest Ref Rng & Units 11/12/2023    3:53 PM 10/09/2023    1:17 PM 10/05/2023    9:50 AM  CBC  WBC 4.0 - 10.5 K/uL 5.8  5.5  3.7   Hemoglobin 13.0 - 17.0 g/dL 86.1  86.3  85.0   Hematocrit 39.0 - 52.0 % 42.7  42.9  46.9   Platelets 150 - 400 K/uL 137  162  159        Latest Ref Rng & Units 11/12/2023    3:53 PM 10/09/2023    1:17 PM 10/05/2023    9:50 AM  CMP  Glucose 70 - 99 mg/dL 899  854  888   BUN 8 - 23 mg/dL 20  17  20    Creatinine 0.61 - 1.24 mg/dL 8.81  9.00  8.77   Sodium 135 - 145 mmol/L 141  139  139   Potassium 3.5 - 5.1 mmol/L 4.4  4.1  4.1   Chloride 98 - 111 mmol/L 104  105  105   CO2 22 - 32 mmol/L 29  26  26    Calcium  8.9 - 10.3 mg/dL 9.9  9.4  9.5   Total Protein 6.5 - 8.1 g/dL 7.9     Total Bilirubin 0.0 - 1.2 mg/dL 0.9     Alkaline Phos 38 - 126 U/L 59     AST 15 - 41 U/L  14     ALT 0 - 44 U/L 7       ASSESSMENT & PLAN SALAAM BATTERSHELL is 89 y.o. male who presents for continued management of  high grade urothelial carcinoma.  #cT1 High grade Non-muscle invasive bladder cancer, BCG refractory  --Initiated intravesical chemotherapy with gemcitabine  and doxcetaxel on 11/14/2023. --Gemcitabine  - Docetaxel  Protocol: Indwelling catheter is placed and the bladder is drained. 1 gram gemcitabine  in 50 cc saline instilled for 1 hour (foley is capped). After 1 hour, uncap foley and drain the gemcitabine . Then instill docetaxel  (37.5 mg in 50 cc saline) for 1 hour. After docetaxel  instillation, catheter can be removed and patient can void medication after leaving the office.  --Plan for weekly induction chemotherapy x 6 weeks and then transition to monthly maintenance chemotherapy x 24 months with cystoscopy q 3 months.  PLAN: --Due for Cycle 1, Day 8 today.  --Most recent labs from 11/12/2023 require no intervention. WBC 5.8, Hgb 13.8, Plt 137K, Creatinine and LFTs in range.  --Continue with Pyridium  200 mg the morning of treatment, then 3 times daily for 2 days as needed for pain --Continue with 1300 mg of sodium bicarbonate  (two 650 mg tabs) the night before and morning of therapy to alkalinize the urine. --Continue with premed with oxybutynin  and compazine  --Proceed with treatment today without any dose modifications --RTC in one week for a toxicity check prior to Cycle 1, Day 15.   #Dysuria: --UA showed nitrites. Culture pending --Will send Bactrim x 7 days.   No orders of the defined types were placed in this encounter.   All questions were answered. The patient knows to call the clinic with any problems, questions or concerns.  I have spent a total of 30 minutes minutes of face-to-face and non-face-to-face time, preparing to see the patient, performing a medically appropriate examination, counseling and educating the patient, documenting clinical information in  the electronic health record, independently interpreting results and communicating results to the patient, and care coordination.   Johnston Police, PA-C Department of Hematology/Oncology [facility] at [facility] Phone: [phone]

## 2023-11-22 NOTE — Patient Instructions (Signed)
 [facility] - A DEPT OF [place]  Discharge Instructions: Thank you for choosing [facility] to provide your oncology and hematology care.   If you have a lab appointment with the Cancer Center, please go directly to the Cancer Center and check in at the registration area.   Wear comfortable clothing and clothing appropriate for easy access to any Portacath or PICC line.   We strive to give you quality time with your provider. You may need to reschedule your appointment if you arrive late (15 or more minutes).  Arriving late affects you and other patients whose appointments are after yours.  Also, if you miss three or more appointments without notifying the office, you may be dismissed from the clinic at the provider's discretion.      For prescription refill requests, have your pharmacy contact our office and allow 72 hours for refills to be completed.    Today you received the following chemotherapy and/or immunotherapy agents: Gemzar /Taxotere       To help prevent nausea and vomiting after your treatment, we encourage you to take your nausea medication as directed.  BELOW ARE SYMPTOMS THAT SHOULD BE REPORTED IMMEDIATELY: *FEVER GREATER THAN 100.4 F (38 C) OR HIGHER *CHILLS OR SWEATING *NAUSEA AND VOMITING THAT IS NOT CONTROLLED WITH YOUR NAUSEA MEDICATION *UNUSUAL SHORTNESS OF BREATH *UNUSUAL BRUISING OR BLEEDING *URINARY PROBLEMS (pain or burning when urinating, or frequent urination) *BOWEL PROBLEMS (unusual diarrhea, constipation, pain near the anus) TENDERNESS IN MOUTH AND THROAT WITH OR WITHOUT PRESENCE OF ULCERS (sore throat, sores in mouth, or a toothache) UNUSUAL RASH, SWELLING OR PAIN  UNUSUAL VAGINAL DISCHARGE OR ITCHING   Items with * indicate a potential emergency and should be followed up as soon as possible or go to the Emergency Department if any problems should occur.  Please show the CHEMOTHERAPY ALERT CARD or  IMMUNOTHERAPY ALERT CARD at check-in to the Emergency Department and triage nurse.  Should you have questions after your visit or need to cancel or reschedule your appointment, please contact [facility] - A DEPT OF [facility]  Dept: [phone]  and follow the prompts.  Office hours are 8:00 a.m. to 4:30 p.m. Monday - Friday. Please note that voicemails left after 4:00 p.m. may not be returned until the following business day.  We are closed weekends and major holidays. You have access to a nurse at all times for urgent questions. Please call the main number to the clinic Dept: [phone] and follow the prompts.   For any non-urgent questions, you may also contact your provider using MyChart. We now offer e-Visits for anyone 53 and older to request care online for non-urgent symptoms. For details visit [email or web address].   Also download the MyChart app! Go to the app store, search MyChart, open the app, select [place], and log in with your MyChart username and password.

## 2023-11-22 NOTE — Progress Notes (Signed)
 31F latex foley catheter inserted without difficulty prior to chemo administration. Lidocaine  not used. Stat lock not used. Pt tolerated well. Pt states he took Pyridium  200mg  1 hour prior to arrival. Pt able to complete both gemzar  and taxotere  for 60 min each without any leaking, only mild discomfort at end of each chemo. Pt discharged in stable condition and ambulated to lobby without difficulty.

## 2023-11-22 NOTE — Addendum Note (Signed)
 Addended by: Zhana Jeangilles T on: 11/22/2023 01:09 PM   Modules accepted: Orders

## 2023-11-23 LAB — URINE CULTURE: Culture: NO GROWTH

## 2023-11-25 ENCOUNTER — Other Ambulatory Visit: Payer: Self-pay

## 2023-11-25 DIAGNOSIS — C678 Malignant neoplasm of overlapping sites of bladder: Secondary | ICD-10-CM

## 2023-11-27 NOTE — Assessment & Plan Note (Signed)
 Stable

## 2023-11-27 NOTE — Assessment & Plan Note (Signed)
 Completed 2/6 weeks of induction. Ok to proceed without additional lab this week. Pyridium  200 mg the morning of treatment, then 3 times daily for 2 days as needed for pain 1300 mg of sodium bicarbonate  (two 650 mg tabs) the night before and morning of therapy to alkalinize the urine. Premed with oxybutynin  and compazine  Return weekly with lab and visit during induction Follow up with cystoscopy every 3 months.

## 2023-11-28 ENCOUNTER — Inpatient Hospital Stay

## 2023-11-28 VITALS — BP 112/61 | HR 73 | Temp 97.0°F | Resp 18 | Wt 166.9 lb

## 2023-11-28 VITALS — BP 121/72 | HR 85 | Temp 97.8°F | Resp 18

## 2023-11-28 DIAGNOSIS — C678 Malignant neoplasm of overlapping sites of bladder: Secondary | ICD-10-CM | POA: Insufficient documentation

## 2023-11-28 DIAGNOSIS — C679 Malignant neoplasm of bladder, unspecified: Secondary | ICD-10-CM

## 2023-11-28 DIAGNOSIS — Z5111 Encounter for antineoplastic chemotherapy: Secondary | ICD-10-CM | POA: Insufficient documentation

## 2023-11-28 DIAGNOSIS — N1832 Chronic kidney disease, stage 3b: Secondary | ICD-10-CM | POA: Diagnosis not present

## 2023-11-28 LAB — URINALYSIS, COMPLETE (UACMP) WITH MICROSCOPIC
Bilirubin Urine: NEGATIVE
Glucose, UA: NEGATIVE mg/dL
Ketones, ur: NEGATIVE mg/dL
Leukocytes,Ua: NEGATIVE
Nitrite: POSITIVE — AB
Protein, ur: NEGATIVE mg/dL
Specific Gravity, Urine: 1.006 (ref 1.005–1.030)
pH: 7 (ref 5.0–8.0)

## 2023-11-28 LAB — CMP (CANCER CENTER ONLY)
ALT: 9 U/L (ref 0–44)
AST: 15 U/L (ref 15–41)
Albumin: 4.4 g/dL (ref 3.5–5.0)
Alkaline Phosphatase: 53 U/L (ref 38–126)
Anion gap: 6 (ref 5–15)
BUN: 27 mg/dL — ABNORMAL HIGH (ref 8–23)
CO2: 30 mmol/L (ref 22–32)
Calcium: 9.7 mg/dL (ref 8.9–10.3)
Chloride: 102 mmol/L (ref 98–111)
Creatinine: 1.78 mg/dL — ABNORMAL HIGH (ref 0.61–1.24)
GFR, Estimated: 36 mL/min — ABNORMAL LOW (ref >60)
Glucose, Bld: 124 mg/dL — ABNORMAL HIGH (ref 70–99)
Potassium: 5 mmol/L (ref 3.5–5.1)
Sodium: 138 mmol/L (ref 135–145)
Total Bilirubin: 0.6 mg/dL (ref 0.0–1.2)
Total Protein: 7.2 g/dL (ref 6.5–8.1)

## 2023-11-28 LAB — CBC WITH DIFFERENTIAL (CANCER CENTER ONLY)
Abs Immature Granulocytes: 0.01 10*3/uL (ref 0.00–0.07)
Basophils Absolute: 0 10*3/uL (ref 0.0–0.1)
Basophils Relative: 1 %
Eosinophils Absolute: 0.1 10*3/uL (ref 0.0–0.5)
Eosinophils Relative: 2 %
HCT: 40.5 % (ref 39.0–52.0)
Hemoglobin: 13.1 g/dL (ref 13.0–17.0)
Immature Granulocytes: 0 %
Lymphocytes Relative: 20 %
Lymphs Abs: 0.8 10*3/uL (ref 0.7–4.0)
MCH: 26.3 pg (ref 26.0–34.0)
MCHC: 32.3 g/dL (ref 30.0–36.0)
MCV: 81.3 fL (ref 80.0–100.0)
Monocytes Absolute: 0.4 10*3/uL (ref 0.1–1.0)
Monocytes Relative: 10 %
Neutro Abs: 2.8 10*3/uL (ref 1.7–7.7)
Neutrophils Relative %: 67 %
Platelet Count: 141 10*3/uL — ABNORMAL LOW (ref 150–400)
RBC: 4.98 MIL/uL (ref 4.22–5.81)
RDW: 16.7 % — ABNORMAL HIGH (ref 11.5–15.5)
WBC Count: 4.2 10*3/uL (ref 4.0–10.5)
nRBC: 0 % (ref 0.0–0.2)

## 2023-11-28 MED ORDER — SODIUM CHLORIDE 0.9 % IV SOLN: INTRAVENOUS | Status: DC

## 2023-11-28 MED ORDER — SODIUM CHLORIDE (PF) 0.9 % IJ SOLN
37.5000 mg | Freq: Once | INTRAVENOUS | Status: AC
  Administered 2023-11-28: 37.5 mg via INTRAVESICAL
  Filled 2023-11-28: qty 3.75

## 2023-11-28 MED ORDER — OXYBUTYNIN CHLORIDE 5 MG PO TABS: 5.0000 mg | ORAL_TABLET | Freq: Once | ORAL | Status: DC | PRN

## 2023-11-28 MED ORDER — LIDOCAINE HCL URETHRAL/MUCOSAL 2 % EX GEL
1.0000 | Freq: Once | CUTANEOUS | Status: AC
  Administered 2023-11-28: 1 via URETHRAL
  Filled 2023-11-28: qty 1

## 2023-11-28 MED ORDER — OXYBUTYNIN CHLORIDE 5 MG PO TABS
5.0000 mg | ORAL_TABLET | Freq: Once | ORAL | Status: AC
  Administered 2023-11-28: 5 mg via ORAL
  Filled 2023-11-28: qty 1

## 2023-11-28 MED ORDER — SODIUM CHLORIDE (PF) 0.9 % IJ SOLN
1000.0000 mg | Freq: Once | INTRAVENOUS | Status: AC
  Administered 2023-11-28: 1000 mg via INTRAVESICAL
  Filled 2023-11-28: qty 26.3

## 2023-11-28 MED ORDER — PROCHLORPERAZINE MALEATE 10 MG PO TABS
10.0000 mg | ORAL_TABLET | Freq: Once | ORAL | Status: AC
  Administered 2023-11-28: 10 mg via ORAL
  Filled 2023-11-28: qty 1

## 2023-11-28 NOTE — Patient Instructions (Addendum)
 [facility] - A DEPT OF [facility]  Discharge Instructions: Thank you for choosing [facility] to provide your oncology and hematology care.   If you have a lab appointment with the Cancer Center, please go directly to the Cancer Center and check in at the registration area.   Wear comfortable clothing and clothing appropriate for easy access to any Portacath or PICC line.   We strive to give you quality time with your provider. You may need to reschedule your appointment if you arrive late (15 or more minutes).  Arriving late affects you and other patients whose appointments are after yours.  Also, if you miss three or more appointments without notifying the office, you may be dismissed from the clinic at the provider's discretion.      For prescription refill requests, have your pharmacy contact our office and allow 72 hours for refills to be completed.    Today you received the following chemotherapy and/or immunotherapy agents: Gemzar  and Taxotere  Intravesicular  (Bladder Installation)   To help prevent nausea and vomiting after your treatment, we encourage you to take your nausea medication as directed.  BELOW ARE SYMPTOMS THAT SHOULD BE REPORTED IMMEDIATELY: *FEVER GREATER THAN 100.4 F (38 C) OR HIGHER *CHILLS OR SWEATING *NAUSEA AND VOMITING THAT IS NOT CONTROLLED WITH YOUR NAUSEA MEDICATION *UNUSUAL SHORTNESS OF BREATH *UNUSUAL BRUISING OR BLEEDING *URINARY PROBLEMS (pain or burning when urinating, or frequent urination) *BOWEL PROBLEMS (unusual diarrhea, constipation, pain near the anus) TENDERNESS IN MOUTH AND THROAT WITH OR WITHOUT PRESENCE OF ULCERS (sore throat, sores in mouth, or a toothache) UNUSUAL RASH, SWELLING OR PAIN  UNUSUAL VAGINAL DISCHARGE OR ITCHING   Items with * indicate a potential emergency and should be followed up as soon as possible or go to the Emergency Department if any problems should occur.  Please  show the CHEMOTHERAPY ALERT CARD or IMMUNOTHERAPY ALERT CARD at check-in to the Emergency Department and triage nurse.  Should you have questions after your visit or need to cancel or reschedule your appointment, please contact [facility]  Dept: [phone]  and follow the prompts.  Office hours are 8:00 a.m. to 4:30 p.m. Monday - Friday. Please note that voicemails left after 4:00 p.m. may not be returned until the following business day.  We are closed weekends and major holidays. You have access to a nurse at all times for urgent questions. Please call the main number to the clinic Dept: [phone] and follow the prompts.   For any non-urgent questions, you may also contact your provider using MyChart. We now offer e-Visits for anyone 4 and older to request care online for non-urgent symptoms. For details visit [email or web address].   Also download the MyChart app! Go to the app store, search MyChart, open the app, select [place], and log in with your MyChart username and password.

## 2023-11-28 NOTE — Progress Notes (Signed)
 [name] OFFICE PROGRESS NOTE  Patient Care Team: [name], MD as PCP - General (Internal Medicine) [name], MD as PCP - Cardiology (Cardiology) [name], Toribio Young, [facility] (Inactive) (Pharmacist)  [name] is a 89 y.o.male with history of prostate cancer with IMRT, NMIBC s/p BCG induction/maintenance [facility].   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 BCG refractory Previous treatment: BCG  Tolerated well. One burning pain sensation and resolved after one pyridium .  Proceed with 3/6 induction today.  If feeling well next week, ok to proceed treatment without labs or appointment. Assessment & Plan Malignant neoplasm of urinary bladder, unspecified site (HCC) Completed 2/6 weeks of induction. Ok to proceed without additional lab this week. Pyridium  200 mg the morning of treatment, then 3 times daily for 2 days as needed for pain 1300 mg of sodium bicarbonate  (two 650 mg tabs) the night before and morning of therapy to alkalinize the urine. Premed with oxybutynin  and compazine  Return weekly with lab and visit during induction Follow up with cystoscopy every 3 months. Stage 3b chronic kidney disease (HCC) Stable.     Dillon Young [name], MD  INTERVAL HISTORY: Patient returns for follow-up. Feeling well. No side effects. No burning pain after one time last week. No diarrhea from antibiotics.  Oncology History  Bladder cancer (HCC)  05/31/2020 Pathology Results   A. PROSTATE AND PROSTATE URETHRA CHIPS, TURP:  High grade urothelial carcinoma  The carcinoma focally invades laminar propria  Muscularis propria (detrusor muscle) is present and not involves  Prostatic glands is not present    01/06/2021 Pathology Results   A. BLADDER, WALL, BIOPSY:  -  High-grade urothelial carcinoma, noninvasive  -  Detrusor muscle present but uninvolved by carcinoma   B. BLADDER, LEFT LATERAL WALL, BIOPSY:  -  High-grade urothelial carcinoma,  noninvasive  -  No detrusor muscle identified    2022 - 2024 Chemotherapy   BCG Induction and maintenance.   01/10/2022 Initial Diagnosis   Bladder cancer (HCC)   09/20/2023 Pathology Results   BLADDER WALL, RIGHT LATERAL, BIOPSY:  High grade urothelial carcinoma  The carcinoma focally invades laminar propria  Muscularis propria (detrusor muscle) is not present    10/15/2023 Pathology Results   BLADDER TUMOR, TURBT:  Small fragments of high-grade urothelial carcinoma  Submucosa and muscularis propria with chronic inflammation  Negative for invasive carcinoma    10/29/2023 Cancer Staging   Staging form: Urinary Bladder, AJCC 8th Edition - Clinical: Stage I (cT1, cN0, cM0) - Signed by [name] Dillon BROCKS, MD on 10/29/2023 WHO/ISUP grade (low/high): High Grade Histologic grading system: 2 grade system   11/15/2023 -  Chemotherapy   Patient is on Treatment Plan : BLADDER Gemcitabine  (1000), Docetaxel  (37.5) INTRAVESICAL q7d        PHYSICAL EXAMINATION: ECOG PERFORMANCE STATUS: 1 - Symptomatic but completely ambulatory  Vitals:   11/28/23 0840  BP: 112/61  Pulse: 73  Resp: 18  Temp: (!) 97 F (36.1 C)   Filed Weights   11/28/23 0840  Weight: 166 lb 14.4 oz (75.7 kg)   No distress. Abdominal soft. Non-tender.  Relevant data reviewed during this visit included labs.

## 2023-11-28 NOTE — Progress Notes (Signed)
 Pt.  here for Intra-vesicular chemotherapy. Pre medications given and Lidocaine  used for 14 French Foley insertion. Tolerated bladder chemotherapy with Gemcitabine  and Docetaxel  and 60 minute wait time in between each chemotherapy treatment. No leakage and minor discomfort noted. Vital signs stable, left via ambulation, no respiratory distress noted.

## 2023-12-02 ENCOUNTER — Other Ambulatory Visit: Payer: Self-pay

## 2023-12-02 ENCOUNTER — Telehealth: Payer: Self-pay | Admitting: *Deleted

## 2023-12-02 NOTE — Telephone Encounter (Signed)
 Dillon Young called to see if he can cancel his treatment appointment for Friday. Has company coming into town and wants to spend as much time as possible with them. Will resume treatment on 7/17 is OK with Dr Tina.

## 2023-12-03 NOTE — Telephone Encounter (Signed)
 Notified that it is OK to skip a week.

## 2023-12-06 ENCOUNTER — Ambulatory Visit: Admitting: Nurse Practitioner

## 2023-12-06 ENCOUNTER — Inpatient Hospital Stay

## 2023-12-06 ENCOUNTER — Other Ambulatory Visit

## 2023-12-07 ENCOUNTER — Other Ambulatory Visit: Payer: Self-pay

## 2023-12-07 DIAGNOSIS — C678 Malignant neoplasm of overlapping sites of bladder: Secondary | ICD-10-CM

## 2023-12-11 ENCOUNTER — Other Ambulatory Visit: Payer: Self-pay | Admitting: *Deleted

## 2023-12-11 DIAGNOSIS — C678 Malignant neoplasm of overlapping sites of bladder: Secondary | ICD-10-CM

## 2023-12-11 NOTE — Progress Notes (Unsigned)
 [facility] OFFICE PROGRESS NOTE  Patient Care Team: [name], MD as PCP - General (Internal Medicine) [name], MD as PCP - Cardiology (Cardiology) [name], [name], [facility] (Inactive) (Pharmacist)  Ludwin is a 89 y.o.male with history of prostate cancer with IMRT, NMIBC s/p BCG induction/maintenance being [facility] for NMIBC.   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 BCG refractory Previous treatment: BCG   Tolerated well. One burning pain sensation and resolved after one pyridium .   Proceed with 4/6 induction today.  Tolerating well.  Hematuria resolved.  Return next week for week 5. Assessment & Plan Malignant neoplasm of urinary bladder, unspecified site (HCC) Completed 3/6 weeks of induction. Hematuria resolved. Ok to proceed today Pyridium  200 mg the morning of treatment, then 3 times daily for 2 days as needed for pain 1300 mg of sodium bicarbonate  (two 650 mg tabs) the night before and morning of therapy to alkalinize the urine. Premed with oxybutynin  and compazine  Return weekly with lab and visit during induction Follow up with cystoscopy every 3 months with Dr. Brunetta. Stage 3b chronic kidney disease (HCC) Stable. Will continue 64 oz fluid daily.   [name], MD  INTERVAL HISTORY: Patient returns for follow-up. He drinks a lot fluid. Urine is almost clear. No hematuria. Report previous hematuria stopped after started treatment. No fever, chills.  Oncology History  Bladder cancer (HCC)  05/31/2020 Pathology Results   A. PROSTATE AND PROSTATE URETHRA CHIPS, TURP:  High grade urothelial carcinoma  The carcinoma focally invades laminar propria  Muscularis propria (detrusor muscle) is present and not involves  Prostatic glands is not present    01/06/2021 Pathology Results   A. BLADDER, WALL, BIOPSY:  -  High-grade urothelial carcinoma, noninvasive  -  Detrusor muscle present but uninvolved by carcinoma   B. BLADDER, LEFT  LATERAL WALL, BIOPSY:  -  High-grade urothelial carcinoma, noninvasive  -  No detrusor muscle identified    2022 - 2024 Chemotherapy   BCG Induction and maintenance.   01/10/2022 Initial Diagnosis   Bladder cancer (HCC)   09/20/2023 Pathology Results   BLADDER WALL, RIGHT LATERAL, BIOPSY:  High grade urothelial carcinoma  The carcinoma focally invades laminar propria  Muscularis propria (detrusor muscle) is not present    10/15/2023 Pathology Results   BLADDER TUMOR, TURBT:  Small fragments of high-grade urothelial carcinoma  Submucosa and muscularis propria with chronic inflammation  Negative for invasive carcinoma    10/29/2023 Cancer Staging   Staging form: Urinary Bladder, AJCC 8th Edition - Clinical: Stage I (cT1, cN0, cM0) - Signed by [name], MD on 10/29/2023 WHO/ISUP grade (low/high): High Grade Histologic grading system: 2 grade system   11/15/2023 -  Chemotherapy   Patient is on Treatment Plan : BLADDER Gemcitabine  (1000), Docetaxel  (37.5) INTRAVESICAL q7d        PHYSICAL EXAMINATION: ECOG PERFORMANCE STATUS: 1 - Symptomatic but completely ambulatory  Vitals:   12/12/23 0950  BP: 105/68  Pulse: 72  Resp: 18  Temp: (!) 97.2 F (36.2 C)  SpO2: 98%   Filed Weights   12/12/23 0950  Weight: 167 lb (75.8 kg)    GENERAL: alert, no distress and comfortable  Relevant data reviewed during this visit included labs.

## 2023-12-12 ENCOUNTER — Inpatient Hospital Stay

## 2023-12-12 VITALS — BP 105/68 | HR 72 | Temp 97.2°F | Resp 18 | Wt 167.0 lb

## 2023-12-12 DIAGNOSIS — Z5111 Encounter for antineoplastic chemotherapy: Secondary | ICD-10-CM | POA: Diagnosis not present

## 2023-12-12 DIAGNOSIS — C678 Malignant neoplasm of overlapping sites of bladder: Secondary | ICD-10-CM

## 2023-12-12 DIAGNOSIS — C679 Malignant neoplasm of bladder, unspecified: Secondary | ICD-10-CM

## 2023-12-12 DIAGNOSIS — N1832 Chronic kidney disease, stage 3b: Secondary | ICD-10-CM | POA: Diagnosis not present

## 2023-12-12 LAB — URINALYSIS, COMPLETE (UACMP) WITH MICROSCOPIC
Bilirubin Urine: NEGATIVE
Glucose, UA: NEGATIVE mg/dL
Ketones, ur: NEGATIVE mg/dL
Nitrite: POSITIVE — AB
Protein, ur: 30 mg/dL — AB
Specific Gravity, Urine: 1.013 (ref 1.005–1.030)
pH: 7 (ref 5.0–8.0)

## 2023-12-12 LAB — CMP (CANCER CENTER ONLY)
ALT: 9 U/L (ref 0–44)
AST: 14 U/L — ABNORMAL LOW (ref 15–41)
Albumin: 4.2 g/dL (ref 3.5–5.0)
Alkaline Phosphatase: 52 U/L (ref 38–126)
Anion gap: 5 (ref 5–15)
BUN: 30 mg/dL — ABNORMAL HIGH (ref 8–23)
CO2: 30 mmol/L (ref 22–32)
Calcium: 9.3 mg/dL (ref 8.9–10.3)
Chloride: 105 mmol/L (ref 98–111)
Creatinine: 1.33 mg/dL — ABNORMAL HIGH (ref 0.61–1.24)
GFR, Estimated: 51 mL/min — ABNORMAL LOW (ref >60)
Glucose, Bld: 123 mg/dL — ABNORMAL HIGH (ref 70–99)
Potassium: 4.7 mmol/L (ref 3.5–5.1)
Sodium: 140 mmol/L (ref 135–145)
Total Bilirubin: 0.7 mg/dL (ref 0.0–1.2)
Total Protein: 7 g/dL (ref 6.5–8.1)

## 2023-12-12 LAB — CBC WITH DIFFERENTIAL (CANCER CENTER ONLY)
Abs Immature Granulocytes: 0.01 K/uL (ref 0.00–0.07)
Basophils Absolute: 0 K/uL (ref 0.0–0.1)
Basophils Relative: 0 %
Eosinophils Absolute: 0 K/uL (ref 0.0–0.5)
Eosinophils Relative: 0 %
HCT: 41.3 % (ref 39.0–52.0)
Hemoglobin: 13.5 g/dL (ref 13.0–17.0)
Immature Granulocytes: 0 %
Lymphocytes Relative: 20 %
Lymphs Abs: 1 K/uL (ref 0.7–4.0)
MCH: 26.7 pg (ref 26.0–34.0)
MCHC: 32.7 g/dL (ref 30.0–36.0)
MCV: 81.8 fL (ref 80.0–100.0)
Monocytes Absolute: 0.5 K/uL (ref 0.1–1.0)
Monocytes Relative: 11 %
Neutro Abs: 3.2 K/uL (ref 1.7–7.7)
Neutrophils Relative %: 69 %
Platelet Count: 154 K/uL (ref 150–400)
RBC: 5.05 MIL/uL (ref 4.22–5.81)
RDW: 17.7 % — ABNORMAL HIGH (ref 11.5–15.5)
WBC Count: 4.7 K/uL (ref 4.0–10.5)
nRBC: 0 % (ref 0.0–0.2)

## 2023-12-12 MED ORDER — SODIUM CHLORIDE (PF) 0.9 % IJ SOLN
37.5000 mg | Freq: Once | INTRAVENOUS | Status: AC
  Administered 2023-12-12: 37.5 mg via INTRAVESICAL
  Filled 2023-12-12: qty 3.75

## 2023-12-12 MED ORDER — OXYBUTYNIN CHLORIDE 5 MG PO TABS: 5.0000 mg | ORAL_TABLET | Freq: Once | ORAL | Status: DC | PRN

## 2023-12-12 MED ORDER — OXYBUTYNIN CHLORIDE 5 MG PO TABS
5.0000 mg | ORAL_TABLET | Freq: Once | ORAL | Status: AC
  Administered 2023-12-12: 5 mg via ORAL
  Filled 2023-12-12: qty 1

## 2023-12-12 MED ORDER — SODIUM CHLORIDE (PF) 0.9 % IJ SOLN
1000.0000 mg | Freq: Once | INTRAVENOUS | Status: AC
  Administered 2023-12-12: 1000 mg via INTRAVESICAL
  Filled 2023-12-12: qty 26.3

## 2023-12-12 MED ORDER — PROCHLORPERAZINE MALEATE 10 MG PO TABS
10.0000 mg | ORAL_TABLET | Freq: Once | ORAL | Status: AC
  Administered 2023-12-12: 10 mg via ORAL
  Filled 2023-12-12: qty 1

## 2023-12-12 NOTE — Patient Instructions (Signed)
 [facility] - A DEPT OF [facility]  Discharge Instructions: Thank you for choosing [facility] to provide your oncology and hematology care.   If you have a lab appointment with the Cancer Center, please go directly to the Cancer Center and check in at the registration area.   Wear comfortable clothing and clothing appropriate for easy access to any Portacath or PICC line.   We strive to give you quality time with your provider. You may need to reschedule your appointment if you arrive late (15 or more minutes).  Arriving late affects you and other patients whose appointments are after yours.  Also, if you miss three or more appointments without notifying the office, you may be dismissed from the clinic at the provider's discretion.      For prescription refill requests, have your pharmacy contact our office and allow 72 hours for refills to be completed.    Today you received the following chemotherapy and/or immunotherapy agents: Gemzar  and Taxotere  Intravesicular  (Bladder Installation)   To help prevent nausea and vomiting after your treatment, we encourage you to take your nausea medication as directed.  BELOW ARE SYMPTOMS THAT SHOULD BE REPORTED IMMEDIATELY: *FEVER GREATER THAN 100.4 F (38 C) OR HIGHER *CHILLS OR SWEATING *NAUSEA AND VOMITING THAT IS NOT CONTROLLED WITH YOUR NAUSEA MEDICATION *UNUSUAL SHORTNESS OF BREATH *UNUSUAL BRUISING OR BLEEDING *URINARY PROBLEMS (pain or burning when urinating, or frequent urination) *BOWEL PROBLEMS (unusual diarrhea, constipation, pain near the anus) TENDERNESS IN MOUTH AND THROAT WITH OR WITHOUT PRESENCE OF ULCERS (sore throat, sores in mouth, or a toothache) UNUSUAL RASH, SWELLING OR PAIN  UNUSUAL VAGINAL DISCHARGE OR ITCHING   Items with * indicate a potential emergency and should be followed up as soon as possible or go to the Emergency Department if any problems should occur.  Please  show the CHEMOTHERAPY ALERT CARD or IMMUNOTHERAPY ALERT CARD at check-in to the Emergency Department and triage nurse.  Should you have questions after your visit or need to cancel or reschedule your appointment, please contact [facility]  Dept: [phone]  and follow the prompts.  Office hours are 8:00 a.m. to 4:30 p.m. Monday - Friday. Please note that voicemails left after 4:00 p.m. may not be returned until the following business day.  We are closed weekends and major holidays. You have access to a nurse at all times for urgent questions. Please call the main number to the clinic Dept: [phone] and follow the prompts.   For any non-urgent questions, you may also contact your provider using MyChart. We now offer e-Visits for anyone 4 and older to request care online for non-urgent symptoms. For details visit [email or web address].   Also download the MyChart app! Go to the app store, search MyChart, open the app, select [place], and log in with your MyChart username and password.

## 2023-12-12 NOTE — Assessment & Plan Note (Addendum)
 Completed 3/6 weeks of induction. Hematuria resolved. Ok to proceed today Pyridium  200 mg the morning of treatment, then 3 times daily for 2 days as needed for pain 1300 mg of sodium bicarbonate  (two 650 mg tabs) the night before and morning of therapy to alkalinize the urine. Premed with oxybutynin  and compazine  Return weekly with lab and visit during induction Follow up with cystoscopy every 3 months with Dr. Brunetta.

## 2023-12-12 NOTE — Assessment & Plan Note (Addendum)
 Stable. Will continue 64 oz fluid daily.

## 2023-12-16 DIAGNOSIS — L570 Actinic keratosis: Secondary | ICD-10-CM | POA: Diagnosis not present

## 2023-12-16 DIAGNOSIS — X32XXXD Exposure to sunlight, subsequent encounter: Secondary | ICD-10-CM | POA: Diagnosis not present

## 2023-12-16 DIAGNOSIS — L308 Other specified dermatitis: Secondary | ICD-10-CM | POA: Diagnosis not present

## 2023-12-16 DIAGNOSIS — L820 Inflamed seborrheic keratosis: Secondary | ICD-10-CM | POA: Diagnosis not present

## 2023-12-17 DIAGNOSIS — H52203 Unspecified astigmatism, bilateral: Secondary | ICD-10-CM | POA: Diagnosis not present

## 2023-12-17 DIAGNOSIS — R7303 Prediabetes: Secondary | ICD-10-CM | POA: Diagnosis not present

## 2023-12-17 DIAGNOSIS — H524 Presbyopia: Secondary | ICD-10-CM | POA: Diagnosis not present

## 2023-12-17 DIAGNOSIS — H43813 Vitreous degeneration, bilateral: Secondary | ICD-10-CM | POA: Diagnosis not present

## 2023-12-17 DIAGNOSIS — H04123 Dry eye syndrome of bilateral lacrimal glands: Secondary | ICD-10-CM | POA: Diagnosis not present

## 2023-12-17 DIAGNOSIS — H353122 Nonexudative age-related macular degeneration, left eye, intermediate dry stage: Secondary | ICD-10-CM | POA: Diagnosis not present

## 2023-12-17 DIAGNOSIS — H26492 Other secondary cataract, left eye: Secondary | ICD-10-CM | POA: Diagnosis not present

## 2023-12-17 LAB — HM DIABETES EYE EXAM

## 2023-12-17 NOTE — Assessment & Plan Note (Signed)
 Continue weekly induction. Hematuria resolved. Ok to proceed today Pyridium  200 mg the morning of treatment, then 3 times daily for 2 days as needed for pain 1300 mg of sodium bicarbonate  (two 650 mg tabs) the night before and morning of therapy to alkalinize the urine. Premed with oxybutynin  and compazine  Return weekly with lab and visit during induction Follow up with cystoscopy every 3 months with Dr. Brunetta.

## 2023-12-17 NOTE — Assessment & Plan Note (Signed)
 Stable. Will continue 64 oz fluid daily.

## 2023-12-17 NOTE — Progress Notes (Unsigned)
 [name]  Patient Care Team: [name], MD as PCP - General (Internal Medicine) [name], MD as PCP - Cardiology (Cardiology) [name], [name], [facility] (Inactive) (Pharmacist)  Dillon Young is a 89 y.o.male with history of prostate cancer with IMRT, NMIBC s/p BCG induction/maintenance [facility] for NMIBC.   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 BCG refractory Previous treatment: BCG   Tolerated well. One burning pain sensation and resolved after one pyridium .   Proceed with 5/6 induction today.   Tolerating well.  Hematuria resolved.  Return next week for week 6. Assessment & Plan Malignant neoplasm of urinary bladder, unspecified site [name] Continue weekly induction. Hematuria resolved. Ok to proceed today Pyridium  200 mg the morning of treatment, then 3 times daily for 2 days as needed for pain 1300 mg of sodium bicarbonate  (two 650 mg tabs) the night before and morning of therapy to alkalinize the urine. He needs refill today. Sent to his [facility]. Premed with oxybutynin  and compazine  Return weekly with lab and visit during induction Follow up with cystoscopy every 3 months with Dr. Brunetta. Stage 3b chronic kidney disease (HCC) Stable. Will continue 64 oz fluid daily. Other constipation Increase fluid, fiber rich food. Use Miralax  as needed. Colace as need twice daily.    [name], MD  INTERVAL HISTORY: Patient returns for follow-up. Report leaking at home since biopsy. This started even before his biopsy. No blood in urine. No bloody stool. He has good urine output. No nausea, vomiting. He tries to drink a lot of water  after treatment.  Report a few lower abdominal pain, not sure if from his GU tract vs GI. He was constipated for 3 days.   Oncology History  Bladder cancer (HCC)  05/31/2020 Pathology Results   A. PROSTATE AND PROSTATE URETHRA CHIPS, TURP:  High grade urothelial carcinoma  The carcinoma focally  invades laminar propria  Muscularis propria (detrusor muscle) is present and not involves  Prostatic glands is not present    01/06/2021 Pathology Results   A. BLADDER, WALL, BIOPSY:  -  High-grade urothelial carcinoma, noninvasive  -  Detrusor muscle present but uninvolved by carcinoma   B. BLADDER, LEFT LATERAL WALL, BIOPSY:  -  High-grade urothelial carcinoma, noninvasive  -  No detrusor muscle identified    2022 - 2024 Chemotherapy   BCG Induction and maintenance.   01/10/2022 Initial Diagnosis   Bladder cancer (HCC)   09/20/2023 Pathology Results   BLADDER WALL, RIGHT LATERAL, BIOPSY:  High grade urothelial carcinoma  The carcinoma focally invades laminar propria  Muscularis propria (detrusor muscle) is not present    10/15/2023 Pathology Results   BLADDER TUMOR, TURBT:  Small fragments of high-grade urothelial carcinoma  Submucosa and muscularis propria with chronic inflammation  Negative for invasive carcinoma    10/29/2023 Cancer Staging   Staging form: Urinary Bladder, AJCC 8th Edition - Clinical: Stage I (cT1, cN0, cM0) - Signed by [name], MD on 10/29/2023 WHO/ISUP grade (low/high): High Grade Histologic grading system: 2 grade system   11/15/2023 -  Chemotherapy   Patient is on Treatment Plan : BLADDER Gemcitabine  (1000), Docetaxel  (37.5) INTRAVESICAL q7d        PHYSICAL EXAMINATION: ECOG PERFORMANCE STATUS: 1 - Symptomatic but completely ambulatory  Vitals:   12/19/23 0838  BP: 115/62  Pulse: 71  Resp: 17  Temp: 97.6 F (36.4 C)  SpO2: 98%   Filed Weights   12/19/23 0838  Weight: 171  lb (77.6 kg)    GENERAL: alert, no distress and comfortable SKIN: skin color normal  ABDOMEN: abdomen soft, non-tender and nondistended. Musculoskeletal: no edema   Relevant data reviewed during this visit included labs.

## 2023-12-18 ENCOUNTER — Encounter: Payer: Self-pay | Admitting: Internal Medicine

## 2023-12-19 ENCOUNTER — Inpatient Hospital Stay

## 2023-12-19 VITALS — BP 115/62 | HR 71 | Temp 97.6°F | Resp 17 | Ht 69.0 in | Wt 171.0 lb

## 2023-12-19 DIAGNOSIS — C679 Malignant neoplasm of bladder, unspecified: Secondary | ICD-10-CM

## 2023-12-19 DIAGNOSIS — N1832 Chronic kidney disease, stage 3b: Secondary | ICD-10-CM

## 2023-12-19 DIAGNOSIS — C678 Malignant neoplasm of overlapping sites of bladder: Secondary | ICD-10-CM | POA: Diagnosis not present

## 2023-12-19 DIAGNOSIS — K5909 Other constipation: Secondary | ICD-10-CM | POA: Diagnosis not present

## 2023-12-19 DIAGNOSIS — Z5111 Encounter for antineoplastic chemotherapy: Secondary | ICD-10-CM | POA: Diagnosis not present

## 2023-12-19 MED ORDER — OXYBUTYNIN CHLORIDE 5 MG PO TABS
5.0000 mg | ORAL_TABLET | Freq: Once | ORAL | Status: AC
  Administered 2023-12-19: 5 mg via ORAL
  Filled 2023-12-19: qty 1

## 2023-12-19 MED ORDER — OXYBUTYNIN CHLORIDE 5 MG PO TABS
5.0000 mg | ORAL_TABLET | Freq: Once | ORAL | Status: DC | PRN
Start: 2023-12-19 — End: 2023-12-19
  Filled 2023-12-19: qty 1

## 2023-12-19 MED ORDER — LIDOCAINE HCL URETHRAL/MUCOSAL 2 % EX GEL
1.0000 | Freq: Once | CUTANEOUS | Status: DC
Start: 2023-12-19 — End: 2023-12-19
  Filled 2023-12-19: qty 1

## 2023-12-19 MED ORDER — SODIUM CHLORIDE (PF) 0.9 % IJ SOLN
1000.0000 mg | Freq: Once | INTRAVENOUS | Status: AC
  Administered 2023-12-19: 1000 mg via INTRAVESICAL
  Filled 2023-12-19: qty 26.3

## 2023-12-19 MED ORDER — PROCHLORPERAZINE MALEATE 10 MG PO TABS
10.0000 mg | ORAL_TABLET | Freq: Once | ORAL | Status: AC
  Administered 2023-12-19: 10 mg via ORAL
  Filled 2023-12-19: qty 1

## 2023-12-19 MED ORDER — SODIUM CHLORIDE (PF) 0.9 % IJ SOLN
37.5000 mg | Freq: Once | INTRAVENOUS | Status: AC
  Administered 2023-12-19: 37.5 mg via INTRAVESICAL
  Filled 2023-12-19: qty 3.75

## 2023-12-19 MED ORDER — SODIUM BICARBONATE 650 MG PO TABS: ORAL_TABLET | ORAL | 1 refills | Status: AC

## 2023-12-19 NOTE — Assessment & Plan Note (Addendum)
 Increase fluid, fiber rich food. Use Miralax  as needed. Colace as need twice daily.

## 2023-12-19 NOTE — Patient Instructions (Signed)
 [facility] - A DEPT OF [facility]  Discharge Instructions: Thank you for choosing [facility] to provide your oncology and hematology care.   If you have a lab appointment with the Cancer Center, please go directly to the Cancer Center and check in at the registration area.   Wear comfortable clothing and clothing appropriate for easy access to any Portacath or PICC line.   We strive to give you quality time with your provider. You may need to reschedule your appointment if you arrive late (15 or more minutes).  Arriving late affects you and other patients whose appointments are after yours.  Also, if you miss three or more appointments without notifying the office, you may be dismissed from the clinic at the provider's discretion.      For prescription refill requests, have your pharmacy contact our office and allow 72 hours for refills to be completed.    Today you received the following chemotherapy and/or immunotherapy agents gemzar  and docetaxel        To help prevent nausea and vomiting after your treatment, we encourage you to take your nausea medication as directed.  BELOW ARE SYMPTOMS THAT SHOULD BE REPORTED IMMEDIATELY: *FEVER GREATER THAN 100.4 F (38 C) OR HIGHER *CHILLS OR SWEATING *NAUSEA AND VOMITING THAT IS NOT CONTROLLED WITH YOUR NAUSEA MEDICATION *UNUSUAL SHORTNESS OF BREATH *UNUSUAL BRUISING OR BLEEDING *URINARY PROBLEMS (pain or burning when urinating, or frequent urination) *BOWEL PROBLEMS (unusual diarrhea, constipation, pain near the anus) TENDERNESS IN MOUTH AND THROAT WITH OR WITHOUT PRESENCE OF ULCERS (sore throat, sores in mouth, or a toothache) UNUSUAL RASH, SWELLING OR PAIN  UNUSUAL VAGINAL DISCHARGE OR ITCHING   Items with * indicate a potential emergency and should be followed up as soon as possible or go to the Emergency Department if any problems should occur.  Please show the CHEMOTHERAPY ALERT CARD or  IMMUNOTHERAPY ALERT CARD at check-in to the Emergency Department and triage nurse.  Should you have questions after your visit or need to cancel or reschedule your appointment, please contact [facility]  Dept: [phone]  and follow the prompts.  Office hours are 8:00 a.m. to 4:30 p.m. Monday - Friday. Please note that voicemails left after 4:00 p.m. may not be returned until the following business day.  We are closed weekends and major holidays. You have access to a nurse at all times for urgent questions. Please call the main number to the clinic Dept: [phone] and follow the prompts.   For any non-urgent questions, you may also contact your provider using MyChart. We now offer e-Visits for anyone 110 and older to request care online for non-urgent symptoms. For details visit [email or web address].   Also download the MyChart app! Go to the app store, search MyChart, open the app, select [place], and log in with your MyChart username and password.

## 2023-12-19 NOTE — Addendum Note (Signed)
 Addended by: TINA HUDSON on: 12/19/2023 09:05 AM   Modules accepted: Orders

## 2023-12-19 NOTE — Addendum Note (Signed)
 Addended by: TINA HUDSON on: 12/19/2023 09:02 AM   Modules accepted: Orders

## 2023-12-24 NOTE — Progress Notes (Unsigned)
 Dillon Young OFFICE PROGRESS NOTE  Patient Care Team: Dillon Debby CROME, MD as PCP - General (Internal Medicine) Dillon Dorn PARAS, MD as PCP - Cardiology (Cardiology) Dillon Young, [name] (Inactive) (Pharmacist)  Dillon Young is a 89 y.o.male with history of prostate cancer with IMRT, NMIBC s/p BCG induction/maintenance, afib, CAD, chronic AC being seen at [facility] for NMIBC.   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 BCG refractory Previous treatment: BCG   Tolerated well. One burning pain sensation and resolved after one pyridium .   Proceed with 6/6 induction chemotherapy with gemcitabine  and docetaxel  today.  Return on 8/29 to start monthly maintenance therapy. Assessment & Plan Malignant neoplasm of urinary bladder, unspecified site [facility]) completing weekly induction today. Ok to proceed today Pyridium  200 mg the morning of treatment, then 3 times daily for 2 days as needed for pain 1300 mg of sodium bicarbonate  (two 650 mg tabs) the night before and morning of therapy to alkalinize the urine. Premed with oxybutynin  and compazine  Return monthly with lab and visit during induction Follow up with cystoscopy every 3 months with Dr. Brunetta. Thrombocytopenia (HCC) Appear chronic and stable. Patient is on apixaban    Dillon Young [name], MD  INTERVAL HISTORY: Patient returns for follow-up.  Overall feeling well.  He denies any pain or new side effects.  No bleeding on anticoagulation.  Oncology History  Bladder cancer (HCC)  05/31/2020 Pathology Results   A. PROSTATE AND PROSTATE URETHRA CHIPS, TURP:  High grade urothelial carcinoma  The carcinoma focally invades laminar propria  Muscularis propria (detrusor muscle) is present and not involves  Prostatic glands is not present    01/06/2021 Pathology Results   A. BLADDER, WALL, BIOPSY:  -  High-grade urothelial carcinoma, noninvasive  -  Detrusor muscle present but uninvolved by carcinoma   B. BLADDER, LEFT LATERAL WALL,  BIOPSY:  -  High-grade urothelial carcinoma, noninvasive  -  No detrusor muscle identified    2022 - 2024 Chemotherapy   BCG Induction and maintenance.   01/10/2022 Initial Diagnosis   Bladder cancer (HCC)   09/20/2023 Pathology Results   BLADDER WALL, RIGHT LATERAL, BIOPSY:  High grade urothelial carcinoma  The carcinoma focally invades laminar propria  Muscularis propria (detrusor muscle) is not present    10/15/2023 Pathology Results   BLADDER TUMOR, TURBT:  Small fragments of high-grade urothelial carcinoma  Submucosa and muscularis propria with chronic inflammation  Negative for invasive carcinoma    10/29/2023 Cancer Staging   Staging form: Urinary Bladder, AJCC 8th Edition - Clinical: Stage I (cT1, cN0, cM0) - Signed by Young Dillon BROCKS, MD on 10/29/2023 WHO/ISUP grade (low/high): High Grade Histologic grading system: 2 grade system   11/15/2023 -  Chemotherapy   Patient is on Treatment Plan : BLADDER Gemcitabine  (1000), Docetaxel  (37.5) INTRAVESICAL q7d        PHYSICAL EXAMINATION: ECOG PERFORMANCE STATUS: 1 - Symptomatic but completely ambulatory  Vitals:   12/27/23 0824  BP: (!) 152/84  Pulse: 93  Resp: 16  Temp: (!) 96.8 F (36 C)  SpO2: 94%   Filed Weights   12/27/23 0824  Weight: 170 lb 6.4 oz (77.3 kg)   No distress  Relevant data reviewed during this visit included labs.

## 2023-12-24 NOTE — Assessment & Plan Note (Addendum)
 completing weekly induction today. Ok to proceed today Pyridium  200 mg the morning of treatment, then 3 times daily for 2 days as needed for pain 1300 mg of sodium bicarbonate  (two 650 mg tabs) the night before and morning of therapy to alkalinize the urine. Premed with oxybutynin  and compazine  Return monthly with lab and visit during induction Follow up with cystoscopy every 3 months with Dr. Brunetta.

## 2023-12-26 ENCOUNTER — Other Ambulatory Visit: Payer: Self-pay

## 2023-12-26 ENCOUNTER — Ambulatory Visit

## 2023-12-26 ENCOUNTER — Other Ambulatory Visit: Payer: Self-pay | Admitting: Cardiovascular Disease

## 2023-12-26 ENCOUNTER — Other Ambulatory Visit

## 2023-12-26 ENCOUNTER — Other Ambulatory Visit: Payer: Self-pay | Admitting: *Deleted

## 2023-12-26 DIAGNOSIS — N3945 Continuous leakage: Secondary | ICD-10-CM | POA: Diagnosis not present

## 2023-12-26 DIAGNOSIS — C678 Malignant neoplasm of overlapping sites of bladder: Secondary | ICD-10-CM

## 2023-12-26 DIAGNOSIS — M6281 Muscle weakness (generalized): Secondary | ICD-10-CM | POA: Diagnosis not present

## 2023-12-26 DIAGNOSIS — R351 Nocturia: Secondary | ICD-10-CM | POA: Diagnosis not present

## 2023-12-26 NOTE — Telephone Encounter (Signed)
 Prescription refill request for Eliquis  received.  Indication: afib  Last office visit: 07/08/2023, Berry Scr: 1.33, 12/12/2023 Age: 89 yo  Weight: 77.6 kg   Refill sent.

## 2023-12-27 ENCOUNTER — Inpatient Hospital Stay

## 2023-12-27 VITALS — BP 152/84 | HR 93 | Temp 96.8°F | Resp 16 | Wt 170.4 lb

## 2023-12-27 DIAGNOSIS — I4891 Unspecified atrial fibrillation: Secondary | ICD-10-CM | POA: Diagnosis not present

## 2023-12-27 DIAGNOSIS — Z7901 Long term (current) use of anticoagulants: Secondary | ICD-10-CM | POA: Diagnosis not present

## 2023-12-27 DIAGNOSIS — D696 Thrombocytopenia, unspecified: Secondary | ICD-10-CM | POA: Insufficient documentation

## 2023-12-27 DIAGNOSIS — C679 Malignant neoplasm of bladder, unspecified: Secondary | ICD-10-CM

## 2023-12-27 DIAGNOSIS — C678 Malignant neoplasm of overlapping sites of bladder: Secondary | ICD-10-CM | POA: Diagnosis not present

## 2023-12-27 DIAGNOSIS — N1832 Chronic kidney disease, stage 3b: Secondary | ICD-10-CM | POA: Diagnosis not present

## 2023-12-27 DIAGNOSIS — Z5111 Encounter for antineoplastic chemotherapy: Secondary | ICD-10-CM | POA: Insufficient documentation

## 2023-12-27 LAB — URINALYSIS, COMPLETE (UACMP) WITH MICROSCOPIC
Bacteria, UA: NONE SEEN
Bilirubin Urine: NEGATIVE
Glucose, UA: NEGATIVE mg/dL
Ketones, ur: NEGATIVE mg/dL
Leukocytes,Ua: NEGATIVE
Nitrite: POSITIVE — AB
Protein, ur: NEGATIVE mg/dL
Specific Gravity, Urine: 1.01 (ref 1.005–1.030)
pH: 7 (ref 5.0–8.0)

## 2023-12-27 LAB — CBC WITH DIFFERENTIAL (CANCER CENTER ONLY)
Abs Immature Granulocytes: 0.02 K/uL (ref 0.00–0.07)
Basophils Absolute: 0 K/uL (ref 0.0–0.1)
Basophils Relative: 0 %
Eosinophils Absolute: 0 K/uL (ref 0.0–0.5)
Eosinophils Relative: 1 %
HCT: 39.2 % (ref 39.0–52.0)
Hemoglobin: 12.7 g/dL — ABNORMAL LOW (ref 13.0–17.0)
Immature Granulocytes: 1 %
Lymphocytes Relative: 19 %
Lymphs Abs: 0.8 K/uL (ref 0.7–4.0)
MCH: 27 pg (ref 26.0–34.0)
MCHC: 32.4 g/dL (ref 30.0–36.0)
MCV: 83.2 fL (ref 80.0–100.0)
Monocytes Absolute: 0.4 K/uL (ref 0.1–1.0)
Monocytes Relative: 10 %
Neutro Abs: 3 K/uL (ref 1.7–7.7)
Neutrophils Relative %: 69 %
Platelet Count: 127 K/uL — ABNORMAL LOW (ref 150–400)
RBC: 4.71 MIL/uL (ref 4.22–5.81)
RDW: 17.7 % — ABNORMAL HIGH (ref 11.5–15.5)
WBC Count: 4.3 K/uL (ref 4.0–10.5)
nRBC: 0 % (ref 0.0–0.2)

## 2023-12-27 LAB — CMP (CANCER CENTER ONLY)
ALT: 10 U/L (ref 0–44)
AST: 14 U/L — ABNORMAL LOW (ref 15–41)
Albumin: 4.3 g/dL (ref 3.5–5.0)
Alkaline Phosphatase: 52 U/L (ref 38–126)
Anion gap: 4 — ABNORMAL LOW (ref 5–15)
BUN: 21 mg/dL (ref 8–23)
CO2: 33 mmol/L — ABNORMAL HIGH (ref 22–32)
Calcium: 9.5 mg/dL (ref 8.9–10.3)
Chloride: 104 mmol/L (ref 98–111)
Creatinine: 1.15 mg/dL (ref 0.61–1.24)
GFR, Estimated: >60 mL/min (ref >60)
Glucose, Bld: 130 mg/dL — ABNORMAL HIGH (ref 70–99)
Potassium: 4.4 mmol/L (ref 3.5–5.1)
Sodium: 141 mmol/L (ref 135–145)
Total Bilirubin: 0.6 mg/dL (ref 0.0–1.2)
Total Protein: 7 g/dL (ref 6.5–8.1)

## 2023-12-27 MED ORDER — SODIUM CHLORIDE (PF) 0.9 % IJ SOLN
37.5000 mg | Freq: Once | INTRAVENOUS | Status: AC
  Administered 2023-12-27: 37.5 mg via INTRAVESICAL
  Filled 2023-12-27: qty 3.75

## 2023-12-27 MED ORDER — PROCHLORPERAZINE MALEATE 10 MG PO TABS
10.0000 mg | ORAL_TABLET | Freq: Once | ORAL | Status: AC
Start: 2023-12-27 — End: 2023-12-27
  Administered 2023-12-27: 10 mg via ORAL
  Filled 2023-12-27: qty 1

## 2023-12-27 MED ORDER — LIDOCAINE HCL URETHRAL/MUCOSAL 2 % EX GEL
1.0000 | Freq: Once | CUTANEOUS | Status: DC
  Filled 2023-12-27: qty 1

## 2023-12-27 MED ORDER — OXYBUTYNIN CHLORIDE 5 MG PO TABS
5.0000 mg | ORAL_TABLET | Freq: Once | ORAL | Status: AC
  Administered 2023-12-27: 5 mg via ORAL
  Filled 2023-12-27: qty 1

## 2023-12-27 MED ORDER — SODIUM CHLORIDE (PF) 0.9 % IJ SOLN
1000.0000 mg | Freq: Once | INTRAVENOUS | Status: AC
  Administered 2023-12-27: 1000 mg via INTRAVESICAL
  Filled 2023-12-27: qty 26.3

## 2023-12-27 MED ORDER — OXYBUTYNIN CHLORIDE 5 MG PO TABS
5.0000 mg | ORAL_TABLET | Freq: Once | ORAL | Status: AC | PRN
  Administered 2023-12-27: 5 mg via ORAL
  Filled 2023-12-27: qty 1

## 2023-12-27 NOTE — Assessment & Plan Note (Addendum)
 Appear chronic and stable. Patient is on apixaban 

## 2023-12-27 NOTE — Patient Instructions (Signed)
 [facility] - A DEPT OF [facility]  Discharge Instructions: Thank you for choosing [facility] to provide your oncology and hematology care.   If you have a lab appointment with the Cancer Center, please go directly to the Cancer Center and check in at the registration area.   Wear comfortable clothing and clothing appropriate for easy access to any Portacath or PICC line.   We strive to give you quality time with your provider. You may need to reschedule your appointment if you arrive late (15 or more minutes).  Arriving late affects you and other patients whose appointments are after yours.  Also, if you miss three or more appointments without notifying the office, you may be dismissed from the clinic at the provider's discretion.      For prescription refill requests, have your pharmacy contact our office and allow 72 hours for refills to be completed.    Today you received the following chemotherapy and/or immunotherapy agents: Gemcitabine , Docetaxel .       To help prevent nausea and vomiting after your treatment, we encourage you to take your nausea medication as directed.  BELOW ARE SYMPTOMS THAT SHOULD BE REPORTED IMMEDIATELY: *FEVER GREATER THAN 100.4 F (38 C) OR HIGHER *CHILLS OR SWEATING *NAUSEA AND VOMITING THAT IS NOT CONTROLLED WITH YOUR NAUSEA MEDICATION *UNUSUAL SHORTNESS OF BREATH *UNUSUAL BRUISING OR BLEEDING *URINARY PROBLEMS (pain or burning when urinating, or frequent urination) *BOWEL PROBLEMS (unusual diarrhea, constipation, pain near the anus) TENDERNESS IN MOUTH AND THROAT WITH OR WITHOUT PRESENCE OF ULCERS (sore throat, sores in mouth, or a toothache) UNUSUAL RASH, SWELLING OR PAIN  UNUSUAL VAGINAL DISCHARGE OR ITCHING   Items with * indicate a potential emergency and should be followed up as soon as possible or go to the Emergency Department if any problems should occur.  Please show the CHEMOTHERAPY ALERT CARD or  IMMUNOTHERAPY ALERT CARD at check-in to the Emergency Department and triage nurse.  Should you have questions after your visit or need to cancel or reschedule your appointment, please contact [facility] - A DEPT OF [facility]  Dept: [phone]  and follow the prompts.  Office hours are 8:00 a.m. to 4:30 p.m. Monday - Friday. Please note that voicemails left after 4:00 p.m. may not be returned until the following business day.  We are closed weekends and major holidays. You have access to a nurse at all times for urgent questions. Please call the main number to the clinic Dept: [phone] and follow the prompts.   For any non-urgent questions, you may also contact your provider using MyChart. We now offer e-Visits for anyone 35 and older to request care online for non-urgent symptoms. For details visit [email or web address].   Also download the MyChart app! Go to the app store, search "MyChart", open the app, select , and log in with your MyChart username and password.

## 2023-12-27 NOTE — Progress Notes (Signed)
 Patient tolerated 60 minute intra-vesicular indwelling time for both gemcitabine  and docetaxel  without incident.  Foley catheter removed, ambulated to lobby.

## 2024-01-01 ENCOUNTER — Ambulatory Visit: Admitting: Internal Medicine

## 2024-01-01 ENCOUNTER — Encounter: Payer: Self-pay | Admitting: Internal Medicine

## 2024-01-01 ENCOUNTER — Ambulatory Visit: Payer: Self-pay | Admitting: Internal Medicine

## 2024-01-01 ENCOUNTER — Ambulatory Visit (INDEPENDENT_AMBULATORY_CARE_PROVIDER_SITE_OTHER)

## 2024-01-01 VITALS — BP 130/60 | HR 64 | Temp 97.6°F | Resp 16 | Ht 69.0 in | Wt 168.8 lb

## 2024-01-01 DIAGNOSIS — R052 Subacute cough: Secondary | ICD-10-CM

## 2024-01-01 DIAGNOSIS — E119 Type 2 diabetes mellitus without complications: Secondary | ICD-10-CM

## 2024-01-01 DIAGNOSIS — D510 Vitamin B12 deficiency anemia due to intrinsic factor deficiency: Secondary | ICD-10-CM | POA: Diagnosis not present

## 2024-01-01 DIAGNOSIS — I2510 Atherosclerotic heart disease of native coronary artery without angina pectoris: Secondary | ICD-10-CM | POA: Diagnosis not present

## 2024-01-01 DIAGNOSIS — R059 Cough, unspecified: Secondary | ICD-10-CM | POA: Diagnosis not present

## 2024-01-01 DIAGNOSIS — I4891 Unspecified atrial fibrillation: Secondary | ICD-10-CM | POA: Diagnosis not present

## 2024-01-01 DIAGNOSIS — J310 Chronic rhinitis: Secondary | ICD-10-CM | POA: Diagnosis not present

## 2024-01-01 LAB — HEMOGLOBIN A1C: Hgb A1c MFr Bld: 6.9 % — ABNORMAL HIGH (ref 4.6–6.5)

## 2024-01-01 MED ORDER — NITROGLYCERIN 0.4 MG SL SUBL: 0.4000 mg | SUBLINGUAL_TABLET | SUBLINGUAL | 3 refills | Status: AC | PRN

## 2024-01-01 MED ORDER — CYANOCOBALAMIN 2000 MCG PO TABS: 2000.0000 ug | ORAL_TABLET | Freq: Every day | ORAL | 1 refills | Status: DC

## 2024-01-01 MED ORDER — IPRATROPIUM BROMIDE 0.06 % NA SOLN
2.0000 | Freq: Two times a day (BID) | NASAL | 1 refills | Status: DC | PRN
Start: 2024-01-01 — End: 2024-02-04

## 2024-01-01 MED ORDER — CYANOCOBALAMIN 1000 MCG/ML IJ SOLN
1000.0000 ug | Freq: Once | INTRAMUSCULAR | Status: AC
  Administered 2024-01-01: 1000 ug via INTRAMUSCULAR

## 2024-01-01 NOTE — Progress Notes (Signed)
 "  Subjective:  Patient ID: Dillon Young, male    DOB: [birth year]  Age: 89 y.o. MRN: [id number]  CC: COPD, Diabetes, Congestive Heart Failure, and Atrial Fibrillation   HPI Dillon Young presents for f/up ----  Discussed the use of AI scribe software for clinical note transcription with the patient, who gave verbal consent to proceed.  History of Present Illness Dillon Young is an 89 year old male who presents with a persistent cough and runny nose.  He has been experiencing a runny nose and throat mucus, accompanied by a cough, for several weeks to months. The cough is described as almost strangling when it starts, but no hemoptysis or productive cough, with any fluid produced being clear. No sinus pain, fever, or chills. Occasional shortness of breath is noted, but no wheezing.  He is currently taking delsym cough medicine, which seems to help with the cough. He also gargles with salt to alleviate the raw feeling in his throat, which he attributes to drainage.  He has not had a chest x-ray in the past year but has had an EKG. He mentions experiencing low blood pressure at times, which makes him feel weak, though he has not experienced syncope recently.    Outpatient Medications Prior to Visit  Medication Sig Dispense Refill   acetaminophen  (TYLENOL ) 500 MG tablet Take 500-1,000 mg by mouth daily as needed for mild pain or headache.     ascorbic acid  (VITAMIN C ) 500 MG tablet Take 500 mg by mouth 2 (two) times a week.     carvedilol  (COREG ) 3.125 MG tablet TAKE 1 TABLET BY MOUTH TWICE A DAY WITH A MEAL *TAKE WITH 6.25MG  TO EQUAL 9.375MG  TWICE A DAY* 180 tablet 0   carvedilol  (COREG ) 6.25 MG tablet Take 1 tablet (6.25 mg total) by mouth 2 (two) times daily. Take with 3.125 to equal 9.375 twice daily 180 tablet 0   Cholecalciferol  (VITAMIN D -3) 25 MCG (1000 UT) CAPS Take 1,000 Units by mouth daily.     dicyclomine  (BENTYL ) 10 MG capsule TAKE 1 CAPSULE (10 MG TOTAL) BY  MOUTH EVERY 6 (SIX) HOURS AS NEEDED (ABDOMINAL PAIN). 30 capsule 1   ELIQUIS  5 MG TABS tablet TAKE 1 TABLET BY MOUTH TWICE A DAY 180 tablet 1   fexofenadine (ALLEGRA) 180 MG tablet Take 180 mg by mouth daily as needed for allergies or rhinitis.      Fluocinolone  Acetonide 0.01 % OIL 3 drops in affected ear(s) once or twice daily as needed for itching     fluticasone  (FLONASE ) 50 MCG/ACT nasal spray Place 2 sprays into both nostrils daily. (Patient taking differently: Place 2 sprays into both nostrils daily as needed for allergies.) 16 g 6   gabapentin  (NEURONTIN ) 300 MG capsule TAKE 1 CAPSULE BY MOUTH THREE TIMES A DAY 270 capsule 1   hydrALAZINE  (APRESOLINE ) 25 MG tablet Take 25 mg by mouth 3 (three) times daily as needed (for bp over 160).     hydrocortisone  (ANUSOL -HC) 2.5 % rectal cream Place 1 application rectally 3 (three) times daily as needed for hemorrhoids. 30 g 1   Hypromellose (ARTIFICIAL TEARS OP) Place 1 drop into both eyes 4 (four) times daily as needed (dry eyes).     melatonin 5 MG TABS Take 5 mg by mouth at bedtime as needed (sleep).     ondansetron  (ZOFRAN ) 8 MG tablet Take 1 tablet (8 mg total) by mouth every 8 (eight) hours as needed for nausea or vomiting. 30  tablet 1   prochlorperazine  (COMPAZINE ) 10 MG tablet Take 1 tablet (10 mg total) by mouth every 6 (six) hours as needed for nausea or vomiting. 30 tablet 1   rosuvastatin  (CRESTOR ) 10 MG tablet TAKE 1 TABLET BY MOUTH EVERY DAY 90 tablet 0   sacubitril -valsartan  (ENTRESTO ) 97-103 MG Take 0.5 tablets by mouth 2 (two) times daily.     sodium bicarbonate  650 MG tablet Take 1300 mg (two 650 mg tabs) the night before and morning of therapy. 24 tablet 1   sodium chloride  (OCEAN) 0.65 % SOLN nasal spray Place 1 spray into both nostrils as needed for congestion.     SPIRIVA  RESPIMAT 2.5 MCG/ACT AERS Inhale 2 puffs into the lungs daily as needed (shortness of breath).     triamcinolone cream (KENALOG) 0.1 % Apply 1 application  topically daily as needed (to affected areas, for itching).      cefdinir  (OMNICEF ) 300 MG capsule TAKE 1 CAPSULE BY MOUTH 2 TIMES DAILY FOR 7 DAYS. 6 capsule 0   ipratropium (ATROVENT ) 0.06 % nasal spray Place 2 sprays into both nostrils 2 (two) times daily. (Patient taking differently: Place 2 sprays into both nostrils 2 (two) times daily as needed for rhinitis.) 45 mL 1   nitroGLYCERIN  (NITROSTAT ) 0.4 MG SL tablet Place 1 tablet (0.4 mg total) under the tongue every 5 (five) minutes as needed for chest pain. 25 tablet 3   oxyCODONE -acetaminophen  (PERCOCET) 5-325 MG tablet Take 1 tablet by mouth every 6 (six) hours as needed for severe pain (pain score 7-10). 10 tablet 0   phenazopyridine  (PYRIDIUM ) 200 MG tablet Take one tab the morning of treatment, then up to 3 times a day as needed for pain for 2 days 30 tablet 1   sacubitril -valsartan  (ENTRESTO ) 49-51 MG Take 1 tablet by mouth 2 (two) times daily. 60 tablet 6   No facility-administered medications prior to visit.    ROS Review of Systems  Constitutional:  Negative for appetite change, chills, diaphoresis, fatigue and fever.  HENT:  Positive for hearing loss, postnasal drip and rhinorrhea. Negative for congestion, facial swelling, nosebleeds, sinus pressure, sinus pain and sore throat.   Eyes: Negative.   Respiratory:  Positive for cough. Negative for chest tightness, shortness of breath and wheezing.   Cardiovascular:  Negative for chest pain, palpitations and leg swelling.  Gastrointestinal: Negative.  Negative for abdominal pain, constipation, diarrhea, nausea and vomiting.  Genitourinary: Negative.  Negative for difficulty urinating.  Musculoskeletal: Negative.  Negative for arthralgias and myalgias.  Skin: Negative.   Neurological:  Positive for dizziness and light-headedness.  Hematological:  Negative for adenopathy. Does not bruise/bleed easily.  Psychiatric/Behavioral:  Positive for confusion and decreased concentration.  Negative for dysphoric mood. The patient is nervous/anxious.     Objective:  BP 130/60 (BP Location: Left Arm, Patient Position: Sitting, Cuff Size: Normal)   Pulse 64   Temp 97.6 F (36.4 C) (Oral)   Resp 16   Ht 5' 9 (1.753 m)   Wt 168 lb 12.8 oz (76.6 kg)   SpO2 99%   BMI 24.93 kg/m   BP Readings from Last 3 Encounters:  01/01/24 130/60  12/27/23 (!) 152/84  12/19/23 115/62    Wt Readings from Last 3 Encounters:  01/01/24 168 lb 12.8 oz (76.6 kg)  12/27/23 170 lb 6.4 oz (77.3 kg)  12/19/23 171 lb (77.6 kg)    Physical Exam Vitals reviewed.  Constitutional:      Appearance: Normal appearance.  HENT:  Nose: Nose normal.     Mouth/Throat:     Mouth: Mucous membranes are moist.  Eyes:     General: No scleral icterus.    Conjunctiva/sclera: Conjunctivae normal.  Cardiovascular:     Rate and Rhythm: Normal rate. Rhythm irregularly irregular. Occasional Extrasystoles are present.    Heart sounds: Normal heart sounds, S1 normal and S2 normal. No murmur heard.    No friction rub. No gallop.     Comments: EKG--- A fib with PVC's, 71 bpm No LVH or Q waves Pulmonary:     Effort: Pulmonary effort is normal.     Breath sounds: No stridor. No wheezing, rhonchi or rales.  Abdominal:     General: Abdomen is flat.     Palpations: There is no mass.     Tenderness: There is no abdominal tenderness. There is no guarding.     Hernia: No hernia is present.  Musculoskeletal:     Cervical back: Neck supple.     Right lower leg: No edema.     Left lower leg: No edema.  Lymphadenopathy:     Cervical: No cervical adenopathy.  Skin:    General: Skin is warm and dry.     Findings: No rash.  Neurological:     General: No focal deficit present.     Mental Status: He is alert. Mental status is at baseline.  Psychiatric:        Mood and Affect: Mood normal.        Behavior: Behavior normal.     Lab Results  Component Value Date   WBC 4.3 12/27/2023   HGB 12.7 (L)  12/27/2023   HCT 39.2 12/27/2023   PLT 127 (L) 12/27/2023   GLUCOSE 130 (H) 12/27/2023   CHOL 118 07/01/2023   TRIG 108.0 07/01/2023   HDL 45.80 07/01/2023   LDLCALC 51 07/01/2023   ALT 10 12/27/2023   AST 14 (L) 12/27/2023   NA 141 12/27/2023   K 4.4 12/27/2023   CL 104 12/27/2023   CREATININE 1.15 12/27/2023   BUN 21 12/27/2023   CO2 33 (H) 12/27/2023   TSH 3.99 07/01/2023   PSA 0.015 12/21/2020   INR 1.2 02/10/2022   HGBA1C 6.9 (H) 01/01/2024   MICROALBUR 5.7 01/06/2020   DG Chest 2 View Result Date: 01/01/2024 CLINICAL DATA:  Cough. EXAM: CHEST - 2 VIEW COMPARISON:  02/25/2023. FINDINGS: Bilateral lungs appear hyperlucent with coarse bronchovascular markings, concerning for underlying mild COPD. Bilateral lungs otherwise appear clear. No dense consolidation or lung collapse. Bilateral costophrenic angles are clear. Normal cardio-mediastinal silhouette. No acute osseous abnormalities. The soft tissues are within normal limits. IMPRESSION: No active cardiopulmonary disease. Electronically Signed   By: Ree Molt M.D.   On: 01/01/2024 11:22     Assessment & Plan:  Vitamin B12 deficiency anemia due to intrinsic factor deficiency -     Cyanocobalamin ; Take 1 tablet (2,000 mcg total) by mouth daily.  Dispense: 90 tablet; Refill: 1 -     Cyanocobalamin   Diabetes mellitus without complication (HCC)- His blood sugar is well controlled. -     Hemoglobin A1c; Future  Subacute cough -     DG Chest 2 View; Future  Atrial fibrillation, unspecified type (HCC) - He has good rate control. -     EKG 12-Lead  Chronic rhinitis -     Ipratropium Bromide ; Place 2 sprays into both nostrils 2 (two) times daily as needed for rhinitis.  Dispense: 45 mL; Refill: 1  Coronary artery disease involving native coronary artery of native heart without angina pectoris -     Nitroglycerin ; Place 1 tablet (0.4 mg total) under the tongue every 5 (five) minutes as needed for chest pain.  Dispense: 25  tablet; Refill: 3     Follow-up: Return in about 6 months (around 07/03/2024).  Debby Molt, MD "

## 2024-01-01 NOTE — Patient Instructions (Signed)
 Vitamin B12 Deficiency Vitamin B12 deficiency means that your body does not have enough vitamin B12. The body needs this important vitamin: To make red blood cells. To make genes (DNA). To help the nerves work. If you do not have enough vitamin B12 in your body, you can have health problems, such as not having enough red blood cells in the blood (anemia). What are the causes? Not eating enough foods that contain vitamin B12. Not being able to take in (absorb) vitamin B12 from the food that you eat. Certain diseases. A condition in which the body does not make enough of a certain protein. This results in your body not taking in enough vitamin B12. Having a surgery in which part of the stomach or small intestine is taken out. Taking medicines that make it hard for the body to take in vitamin B12. These include: Heartburn medicines. Some medicines that are used to treat diabetes. What increases the risk? Being an older adult. Eating a vegetarian or vegan diet that does not include any foods that come from animals. Not eating enough foods that contain vitamin B12 while you are pregnant. Taking certain medicines. Having alcoholism. What are the signs or symptoms? In some cases, there are no symptoms. If the condition leads to too few blood cells or nerve damage, symptoms can occur, such as: Feeling weak or tired. Not being hungry. Losing feeling (numbness) or tingling in your hands and feet. Redness and burning of the tongue. Feeling sad (depressed). Confusion or memory problems. Trouble walking. If anemia is very bad, symptoms can include: Being short of breath. Being dizzy. Having a very fast heartbeat. How is this treated? Changing the way you eat and drink, such as: Eating more foods that contain vitamin B12. Drinking little or no alcohol. Getting vitamin B12 shots. Taking vitamin B12 supplements by mouth (orally). Your doctor will tell you the dose that is best for you. Follow  these instructions at home: Eating and drinking  Eat foods that come from animals and have a lot of vitamin B12 in them. These include: Meats and poultry. This includes beef, pork, chicken, [place], and organ meats, such as liver. Seafood, such as clams, rainbow trout, salmon, tuna, and haddock. Eggs. Dairy foods such as milk, yogurt, and cheese. Eat breakfast cereals that have vitamin B12 added to them (are fortified). Check the label. The items listed above may not be a complete list of foods and beverages you can eat and drink. Contact a dietitian for more information. Alcohol use Do not drink alcohol if: Your doctor tells you not to drink. You are pregnant, may be pregnant, or are planning to become pregnant. If you drink alcohol: Limit how much you have to: 0-1 drink a day for women. 0-2 drinks a day for men. Know how much alcohol is in your drink. In the [place] one drink equals one 12 oz bottle of beer (355 mL), one 5 oz glass of wine (148 mL), or one 1 oz glass of hard liquor (44 mL). General instructions Get any vitamin B12 shots if told by your doctor. Take supplements only as told by your doctor. Follow the directions. Keep all follow-up visits. Contact a doctor if: Your symptoms come back. Your symptoms get worse or do not get better with treatment. Get help right away if: You have trouble breathing. You have a very fast heartbeat. You have chest pain. You get dizzy. You faint. These symptoms may be an emergency. Get help right away. Call 911.  Do not wait to see if the symptoms will go away. Do not drive yourself to the hospital. Summary Vitamin B12 deficiency means that your body is not getting enough of the vitamin. In some cases, there are no symptoms of this condition. Treatment may include making a change in the way you eat and drink, getting shots, or taking supplements. Eat foods that have vitamin B12 in them. This information is not intended to replace advice  given to you by your health care provider. Make sure you discuss any questions you have with your health care provider. Document Revised: 01/06/2021 Document Reviewed: 01/06/2021 Elsevier Patient Education  2024 [employer].

## 2024-01-05 ENCOUNTER — Other Ambulatory Visit: Payer: Self-pay | Admitting: Internal Medicine

## 2024-01-05 DIAGNOSIS — I519 Heart disease, unspecified: Secondary | ICD-10-CM

## 2024-01-05 DIAGNOSIS — I2510 Atherosclerotic heart disease of native coronary artery without angina pectoris: Secondary | ICD-10-CM

## 2024-01-05 DIAGNOSIS — I4819 Other persistent atrial fibrillation: Secondary | ICD-10-CM

## 2024-01-05 DIAGNOSIS — I10 Essential (primary) hypertension: Secondary | ICD-10-CM

## 2024-01-08 DIAGNOSIS — C671 Malignant neoplasm of dome of bladder: Secondary | ICD-10-CM | POA: Diagnosis not present

## 2024-01-08 DIAGNOSIS — C674 Malignant neoplasm of posterior wall of bladder: Secondary | ICD-10-CM | POA: Diagnosis not present

## 2024-01-08 DIAGNOSIS — R310 Gross hematuria: Secondary | ICD-10-CM | POA: Diagnosis not present

## 2024-01-19 ENCOUNTER — Other Ambulatory Visit: Payer: Self-pay | Admitting: Internal Medicine

## 2024-01-19 DIAGNOSIS — I519 Heart disease, unspecified: Secondary | ICD-10-CM

## 2024-01-19 DIAGNOSIS — I4819 Other persistent atrial fibrillation: Secondary | ICD-10-CM

## 2024-01-24 ENCOUNTER — Inpatient Hospital Stay

## 2024-01-24 ENCOUNTER — Telehealth: Payer: Self-pay

## 2024-01-24 VITALS — BP 132/67 | HR 76 | Temp 96.9°F | Resp 17 | Ht 69.0 in | Wt 169.6 lb

## 2024-01-24 DIAGNOSIS — C679 Malignant neoplasm of bladder, unspecified: Secondary | ICD-10-CM

## 2024-01-24 DIAGNOSIS — C678 Malignant neoplasm of overlapping sites of bladder: Secondary | ICD-10-CM

## 2024-01-24 DIAGNOSIS — Z7901 Long term (current) use of anticoagulants: Secondary | ICD-10-CM | POA: Diagnosis not present

## 2024-01-24 DIAGNOSIS — N1832 Chronic kidney disease, stage 3b: Secondary | ICD-10-CM

## 2024-01-24 LAB — CMP (CANCER CENTER ONLY)
ALT: 7 U/L (ref 0–44)
AST: 10 U/L — ABNORMAL LOW (ref 15–41)
Albumin: 4 g/dL (ref 3.5–5.0)
Alkaline Phosphatase: 47 U/L (ref 38–126)
Anion gap: 5 (ref 5–15)
BUN: 22 mg/dL (ref 8–23)
CO2: 31 mmol/L (ref 22–32)
Calcium: 9.1 mg/dL (ref 8.9–10.3)
Chloride: 107 mmol/L (ref 98–111)
Creatinine: 1.18 mg/dL (ref 0.61–1.24)
GFR, Estimated: 59 mL/min — ABNORMAL LOW (ref >60)
Glucose, Bld: 123 mg/dL — ABNORMAL HIGH (ref 70–99)
Potassium: 3.9 mmol/L (ref 3.5–5.1)
Sodium: 143 mmol/L (ref 135–145)
Total Bilirubin: 0.7 mg/dL (ref 0.0–1.2)
Total Protein: 6.8 g/dL (ref 6.5–8.1)

## 2024-01-24 LAB — CBC WITH DIFFERENTIAL (CANCER CENTER ONLY)
Abs Immature Granulocytes: 0.01 K/uL (ref 0.00–0.07)
Basophils Absolute: 0 K/uL (ref 0.0–0.1)
Basophils Relative: 1 %
Eosinophils Absolute: 0.1 K/uL (ref 0.0–0.5)
Eosinophils Relative: 1 %
HCT: 39.8 % (ref 39.0–52.0)
Hemoglobin: 12.9 g/dL — ABNORMAL LOW (ref 13.0–17.0)
Immature Granulocytes: 0 %
Lymphocytes Relative: 19 %
Lymphs Abs: 0.9 K/uL (ref 0.7–4.0)
MCH: 27.5 pg (ref 26.0–34.0)
MCHC: 32.4 g/dL (ref 30.0–36.0)
MCV: 84.9 fL (ref 80.0–100.0)
Monocytes Absolute: 0.6 K/uL (ref 0.1–1.0)
Monocytes Relative: 13 %
Neutro Abs: 3 K/uL (ref 1.7–7.7)
Neutrophils Relative %: 66 %
Platelet Count: 141 K/uL — ABNORMAL LOW (ref 150–400)
RBC: 4.69 MIL/uL (ref 4.22–5.81)
RDW: 17.2 % — ABNORMAL HIGH (ref 11.5–15.5)
WBC Count: 4.6 K/uL (ref 4.0–10.5)
nRBC: 0 % (ref 0.0–0.2)

## 2024-01-24 LAB — URINALYSIS, COMPLETE (UACMP) WITH MICROSCOPIC
Bacteria, UA: NONE SEEN
Bilirubin Urine: NEGATIVE
Glucose, UA: NEGATIVE mg/dL
Ketones, ur: NEGATIVE mg/dL
Leukocytes,Ua: NEGATIVE
Nitrite: POSITIVE — AB
Protein, ur: 30 mg/dL — AB
RBC / HPF: >50 RBC/hpf (ref 0–5)
Specific Gravity, Urine: 1.015 (ref 1.005–1.030)
pH: 8 (ref 5.0–8.0)

## 2024-01-24 MED ORDER — LIDOCAINE HCL URETHRAL/MUCOSAL 2 % EX GEL
1.0000 | Freq: Once | CUTANEOUS | Status: AC
  Administered 2024-01-24: 1 via URETHRAL
  Filled 2024-01-24: qty 1

## 2024-01-24 MED ORDER — SODIUM CHLORIDE (PF) 0.9 % IJ SOLN
1000.0000 mg | Freq: Once | INTRAVENOUS | Status: AC
  Administered 2024-01-24: 1000 mg via INTRAVESICAL
  Filled 2024-01-24: qty 26.3

## 2024-01-24 MED ORDER — PHENAZOPYRIDINE HCL 200 MG PO TABS: 200.0000 mg | ORAL_TABLET | Freq: Three times a day (TID) | ORAL | 0 refills | Status: DC | PRN

## 2024-01-24 MED ORDER — OXYBUTYNIN CHLORIDE 5 MG PO TABS
5.0000 mg | ORAL_TABLET | Freq: Once | ORAL | Status: AC
  Administered 2024-01-24: 5 mg via ORAL
  Filled 2024-01-24: qty 1

## 2024-01-24 MED ORDER — SODIUM CHLORIDE (PF) 0.9 % IJ SOLN
37.5000 mg | Freq: Once | INTRAVENOUS | Status: AC
  Administered 2024-01-24: 37.5 mg via INTRAVESICAL
  Filled 2024-01-24: qty 3.75

## 2024-01-24 MED ORDER — PROCHLORPERAZINE MALEATE 10 MG PO TABS
10.0000 mg | ORAL_TABLET | Freq: Once | ORAL | Status: AC
  Administered 2024-01-24: 10 mg via ORAL
  Filled 2024-01-24: qty 1

## 2024-01-24 NOTE — Assessment & Plan Note (Addendum)
 Start maintenance today 8/29. Pyridium  200 mg the morning of treatment, then 3 times daily for 2 days as needed for pain.  Refill today 1300 mg of sodium bicarbonate  (two 650 mg tabs) the night before and morning of therapy to alkalinize the urine. Premed with oxybutynin  and compazine  Return with lab and visit during each maintenance dose Follow up with cystoscopy in about 3 months with Dr. Brunetta.

## 2024-01-24 NOTE — Progress Notes (Signed)
 [facility] OFFICE PROGRESS NOTE  Patient Care Team: Dillon Debby CROME, MD as PCP - General (Internal Medicine) Dillon Dorn PARAS, MD as PCP - Cardiology (Cardiology) Dillon Young, [facility] (Inactive) (Pharmacist)  Dillon Young is a 89 y.o.male with history of prostate cancer with IMRT, NMIBC s/p BCG induction/maintenance, afib, CAD, chronic AC being seen at [facility] for NMIBC.   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 BCG refractory Previous treatment: BCG Current treatment: 11/15/23 - 12/27/23 6 weekly induction intravesical gemcitabine  docetaxel  He continues to have incontinence and leak at baseline.   Previous hematuria, holding anticoagulation.  This is resolved after resuming anticoagulation.  Starting maintenance intravesical gemcitabine  docetaxel  today.  Hemoglobin is stable. Assessment & Plan Malignant neoplasm of urinary bladder, unspecified site (HCC) Start maintenance today 8/29. Pyridium  200 mg the morning of treatment, then 3 times daily for 2 days as needed for pain.  Refill today 1300 mg of sodium bicarbonate  (two 650 mg tabs) the night before and morning of therapy to alkalinize the urine. Premed with oxybutynin  and compazine  Return with lab and visit during each maintenance dose Follow up with cystoscopy in about 3 months with Dr. Brunetta. Chronic anticoagulation Chronic anticoagulation for history of A-fib Hold anticoagulation if developing recurrent hematuria, once resolved for 2 days, may resume. Stage 3b chronic kidney disease (HCC) Stable. Will continue 64 oz fluid daily posttreatment.  Next treatment on 9/26 and then 10/27.   Dillon Young [name], MD  INTERVAL HISTORY: Patient returns for follow-up. He bled for about 3 days after last dose. He held apixaban . He saw Dr. Watt. He was off for 4 days.  Overall he is feeling well.  No more bleeding.  No nausea or vomiting.  Pyridium  help if he has pain on the date of treatment.  Oncology History  Bladder cancer  (HCC)  05/31/2020 Pathology Results   A. PROSTATE AND PROSTATE URETHRA CHIPS, TURP:  High grade urothelial carcinoma  The carcinoma focally invades laminar propria  Muscularis propria (detrusor muscle) is present and not involves  Prostatic glands is not present    01/06/2021 Pathology Results   A. BLADDER, WALL, BIOPSY:  -  High-grade urothelial carcinoma, noninvasive  -  Detrusor muscle present but uninvolved by carcinoma   B. BLADDER, LEFT LATERAL WALL, BIOPSY:  -  High-grade urothelial carcinoma, noninvasive  -  No detrusor muscle identified    2022 - 2024 Chemotherapy   BCG Induction and maintenance.   01/10/2022 Initial Diagnosis   Bladder cancer (HCC)   09/20/2023 Pathology Results   BLADDER WALL, RIGHT LATERAL, BIOPSY:  High grade urothelial carcinoma  The carcinoma focally invades laminar propria  Muscularis propria (detrusor muscle) is not present    10/15/2023 Pathology Results   BLADDER TUMOR, TURBT:  Small fragments of high-grade urothelial carcinoma  Submucosa and muscularis propria with chronic inflammation  Negative for invasive carcinoma    10/29/2023 Cancer Staging   Staging form: Urinary Bladder, AJCC 8th Edition - Clinical: Stage I (cT1, cN0, cM0) - Signed by Dillon Dillon BROCKS, MD on 10/29/2023 WHO/ISUP grade (low/high): High Grade Histologic grading system: 2 grade system   11/15/2023 -  Chemotherapy   Patient is on Treatment Plan : BLADDER Gemcitabine  (1000), Docetaxel  (37.5) INTRAVESICAL q7d        PHYSICAL EXAMINATION: ECOG PERFORMANCE STATUS: 1 - Symptomatic but completely ambulatory  Vitals:   01/24/24 0822  BP: 132/67  Pulse: 76  Resp: 17  Temp: (!) 96.9 F (36.1 C)  SpO2: 100%  Filed Weights   01/24/24 0822  Weight: 169 lb 9.6 oz (76.9 kg)   Gen: Clinically well.  No acute distress. Skin: color normal, no paleness.  Relevant data reviewed during this visit included labs.

## 2024-01-24 NOTE — Assessment & Plan Note (Addendum)
 Stable. Will continue 64 oz fluid daily posttreatment.

## 2024-01-24 NOTE — Patient Instructions (Signed)
 [facility] - A DEPT OF [facility]  Discharge Instructions: Thank you for choosing [facility] to provide your oncology and hematology care.   If you have a lab appointment with the Cancer Center, please go directly to the Cancer Center and check in at the registration area.   Wear comfortable clothing and clothing appropriate for easy access to any Portacath or PICC line.   We strive to give you quality time with your provider. You may need to reschedule your appointment if you arrive late (15 or more minutes).  Arriving late affects you and other patients whose appointments are after yours.  Also, if you miss three or more appointments without notifying the office, you may be dismissed from the clinic at the provider's discretion.      For prescription refill requests, have your pharmacy contact our office and allow 72 hours for refills to be completed.    Today you received the following chemotherapy and/or immunotherapy agents: docetaxel  and gemcitabine       To help prevent nausea and vomiting after your treatment, we encourage you to take your nausea medication as directed.  BELOW ARE SYMPTOMS THAT SHOULD BE REPORTED IMMEDIATELY: *FEVER GREATER THAN 100.4 F (38 C) OR HIGHER *CHILLS OR SWEATING *NAUSEA AND VOMITING THAT IS NOT CONTROLLED WITH YOUR NAUSEA MEDICATION *UNUSUAL SHORTNESS OF BREATH *UNUSUAL BRUISING OR BLEEDING *URINARY PROBLEMS (pain or burning when urinating, or frequent urination) *BOWEL PROBLEMS (unusual diarrhea, constipation, pain near the anus) TENDERNESS IN MOUTH AND THROAT WITH OR WITHOUT PRESENCE OF ULCERS (sore throat, sores in mouth, or a toothache) UNUSUAL RASH, SWELLING OR PAIN  UNUSUAL VAGINAL DISCHARGE OR ITCHING   Items with * indicate a potential emergency and should be followed up as soon as possible or go to the Emergency Department if any problems should occur.  Please show the CHEMOTHERAPY ALERT CARD  or IMMUNOTHERAPY ALERT CARD at check-in to the Emergency Department and triage nurse.  Should you have questions after your visit or need to cancel or reschedule your appointment, please contact [facility] - A DEPT OF [facility]  Dept: [phone]  and follow the prompts.  Office hours are 8:00 a.m. to 4:30 p.m. Monday - Friday. Please note that voicemails left after 4:00 p.m. may not be returned until the following business day.  We are closed weekends and major holidays. You have access to a nurse at all times for urgent questions. Please call the main number to the clinic Dept: [phone] and follow the prompts.   For any non-urgent questions, you may also contact your provider using MyChart. We now offer e-Visits for anyone 48 and older to request care online for non-urgent symptoms. For details visit [email or web address].   Also download the MyChart app! Go to the app store, search MyChart, open the app, select [place], and log in with your MyChart username and password.

## 2024-01-24 NOTE — Telephone Encounter (Signed)
 Scheduled appointments per 8/29 los. Talked with the patient and he is aware of the made appointments.

## 2024-01-24 NOTE — Assessment & Plan Note (Addendum)
 Chronic anticoagulation for history of A-fib Hold anticoagulation if developing recurrent hematuria, once resolved for 2 days, may resume.

## 2024-01-25 ENCOUNTER — Other Ambulatory Visit: Payer: Self-pay

## 2024-01-26 ENCOUNTER — Other Ambulatory Visit: Payer: Self-pay | Admitting: Adult Health

## 2024-01-27 ENCOUNTER — Other Ambulatory Visit: Payer: Self-pay | Admitting: Internal Medicine

## 2024-01-27 DIAGNOSIS — J310 Chronic rhinitis: Secondary | ICD-10-CM

## 2024-01-28 ENCOUNTER — Telehealth: Payer: Self-pay | Admitting: *Deleted

## 2024-01-28 NOTE — Telephone Encounter (Signed)
 Called patient to follow up on Access[employer] message. Pt states he is doing much better today, no problems now. Was having chills and abdominal pain on Friday.

## 2024-01-29 DIAGNOSIS — L258 Unspecified contact dermatitis due to other agents: Secondary | ICD-10-CM | POA: Diagnosis not present

## 2024-02-04 ENCOUNTER — Telehealth: Payer: Self-pay

## 2024-02-04 NOTE — Telephone Encounter (Signed)
 Patient called in with a concern about a sore on his buttock that is about a 1/4 of an inch in size.It Is a scabby place does not complain of drainage from the site. Was seen by dermatology which gave him a cream he states it is not working has been using for about a week. Has follow up with his dermatologist in 3 weeks. Advised to call them and report no healing to see if they want to change something.He states that he has had this area for about 4 weeks. Has an appointment with Dr Tina on 9/26. Arland Legions BSN RN

## 2024-02-16 ENCOUNTER — Other Ambulatory Visit: Payer: Self-pay | Admitting: Internal Medicine

## 2024-02-16 DIAGNOSIS — E785 Hyperlipidemia, unspecified: Secondary | ICD-10-CM

## 2024-02-19 DIAGNOSIS — L258 Unspecified contact dermatitis due to other agents: Secondary | ICD-10-CM | POA: Diagnosis not present

## 2024-02-20 NOTE — Progress Notes (Unsigned)
 [facility] [phone]   [phone]  PROGRESS NOTE  Patient Care Team: [name], MD as PCP - General (Internal Medicine) [name], MD as PCP - Cardiology (Cardiology) Rozella [name], [facility], [place] (Inactive) (Pharmacist)   CHIEF COMPLAINTS/PURPOSE OF CONSULTATION:  High grade urothelial carcinoma   Oncology History  Bladder cancer [facility])  05/31/2020 Pathology Results   A. PROSTATE AND PROSTATE URETHRA CHIPS, TURP:  High grade urothelial carcinoma  The carcinoma focally invades laminar propria  Muscularis propria (detrusor muscle) is present and not involves  Prostatic glands is not present    01/06/2021 Pathology Results   A. BLADDER, WALL, BIOPSY:  -  High-grade urothelial carcinoma, noninvasive  -  Detrusor muscle present but uninvolved by carcinoma   B. BLADDER, LEFT LATERAL WALL, BIOPSY:  -  High-grade urothelial carcinoma, noninvasive  -  No detrusor muscle identified    2022 - 2024 Chemotherapy   BCG Induction and maintenance.   01/10/2022 Initial Diagnosis   Bladder cancer (HCC)   09/20/2023 Pathology Results   BLADDER WALL, RIGHT LATERAL, BIOPSY:  High grade urothelial carcinoma  The carcinoma focally invades laminar propria  Muscularis propria (detrusor muscle) is not present    10/15/2023 Pathology Results   BLADDER TUMOR, TURBT:  Small fragments of high-grade urothelial carcinoma  Submucosa and muscularis propria with chronic inflammation  Negative for invasive carcinoma    10/29/2023 Cancer Staging   Staging form: Urinary Bladder, AJCC 8th Edition - Clinical: Stage I (cT1, cN0, cM0) - Signed by Tina [name], MD on 10/29/2023 WHO/ISUP grade (low/high): High Grade Histologic grading system: 2 grade system   11/15/2023 -  Chemotherapy   Patient is on Treatment Plan : BLADDER Gemcitabine  (1000), Docetaxel  (37.5) INTRAVESICAL q7d        HISTORY OF PRESENTING ILLNESS:  Dillon Young 89 y.o. male returns for  a toxicity check prior to Cycle 3, Day 1 of intravesical Gemcitabine /Docetaxel . He is unaccompanied for this visit.   On exam today, Dillon Young reports that after the last treatment he developed worsening fatigue, dizziness and decreased appetite that lasted 2 days.  He was in bed for those 2 days but then recovered without any intervention.  He is staying hydrated drinking approximately 1 L of water  per day.  He denies nausea, vomiting or bowel habit changes.  He denies easy bruising or signs of active bleeding.  Patient denies fevers, chills, night sweats, shortness of breath, chest pain or cough.  He has no other complaints.  Rest of the 10 point ROS as below.  MEDICAL HISTORY:  Past Medical History:  Diagnosis Date   Adenomatous colon polyp    Allergic rhinitis    Anemia    Bladder cancer (HCC)    currently on bcg treatments   BPH (benign prostatic hyperplasia)    CAD (coronary artery disease)    sees Dr. Dorn Court    Carotid artery disease    CHF (congestive heart failure) (HCC)    Chronic kidney disease    Sones. Prostate cancer   Clotting disorder    CVA (cerebral infarction)    Diverticulitis    Dysrhythmia    atrial fib - hx of    Edema    recent swelling in feet and ankles    Hemorrhoids    History of kidney stones    HTN (hypertension)    Hx of radiation therapy    for prostate - 2013    Hyperlipidemia LDL goal <  70    IBS (irritable bowel syndrome)    Low back pain    Pre-diabetes    Prostate cancer (HCC) 2008   seed implantation   Sleep apnea    sitting in recliner helps him to breathe per pt , never used cpap sleep study - pt reports could not go to sleep- 6 years ago    Smoker    Stroke [facility])    hx of mini strokes years ago     SURGICAL HISTORY: Past Surgical History:  Procedure Laterality Date   APPENDECTOMY     CARDIAC CATHETERIZATION N/A 01/16/2016   Procedure: Left Heart Cath and Coronary Angiography;  Surgeon: Candyce GORMAN Reek, MD;  Location:  [facility] INVASIVE CV LAB;  Service: Cardiovascular;  Laterality: N/A;   CARDIAC CATHETERIZATION N/A 01/16/2016   Procedure: Coronary Stent Intervention;  Surgeon: Candyce GORMAN Reek, MD;  Location: [facility] INVASIVE CV LAB;  Service: Cardiovascular;  Laterality: N/A;   CARDIOVERSION N/A 11/26/2017   Procedure: CARDIOVERSION;  Surgeon: Mona Vinie BROCKS, MD;  Location: [facility] ENDOSCOPY;  Service: Cardiovascular;  Laterality: N/A;   CARDIOVERSION N/A 03/13/2018   Procedure: CARDIOVERSION;  Surgeon: Mona Vinie BROCKS, MD;  Location: [facility] ENDOSCOPY;  Service: Cardiovascular;  Laterality: N/A;   CARDIOVERSION N/A 07/31/2022   Procedure: CARDIOVERSION;  Surgeon: Pietro Redell GORMAN, MD;  Location: [facility] ENDOSCOPY;  Service: Cardiovascular;  Laterality: N/A;   CARDIOVERSION N/A 09/10/2022   Procedure: CARDIOVERSION;  Surgeon: Okey Vina GAILS, MD;  Location: [facility] INVASIVE CV LAB;  Service: Cardiovascular;  Laterality: N/A;   CATARACT EXTRACTION W/ INTRAOCULAR LENS  IMPLANT, BILATERAL Bilateral    COLONOSCOPY  10/21/2008   diverticulosis, radiation proctitis, internal hemorrhoids   CORONARY ANGIOPLASTY     CORONARY STENT INTERVENTION N/A 12/26/2017   Procedure: CORONARY STENT INTERVENTION;  Surgeon: [name], MD;  Location: [facility] INVASIVE CV LAB;  Service: Cardiovascular;  Laterality: N/A;   CYSTOSCOPY WITH BIOPSY N/A 09/10/2023   Procedure: CYSTOSCOPY, WITH BIOPSY/BILATERAL RETROGRADE;  Surgeon: Watt Rush, MD;  Location: [facility] ORS;  Service: Urology;  Laterality: N/A;  BILATERAL RETROGRADE   ENDARTERECTOMY  11/12/2011   Procedure: ENDARTERECTOMY CAROTID;  Surgeon: Krystal JULIANNA Doing, MD;  Location: [facility] OR;  Service: Vascular;  Laterality: Right;   ESOPHAGOGASTRODUODENOSCOPY (EGD) WITH ESOPHAGEAL DILATION     HEMORRHOID BANDING     LEFT HEART CATH AND CORONARY ANGIOGRAPHY N/A 12/26/2017   Procedure: LEFT HEART CATH AND CORONARY ANGIOGRAPHY;  Surgeon: [name], MD;  Location: [facility] INVASIVE CV LAB;  Service: Cardiovascular;  Laterality:  N/A;   LITHOTRIPSY  X 3   LUMBAR LAMINECTOMY     TRANSURETHRAL RESECTION OF BLADDER TUMOR N/A 10/15/2023   Procedure: TURBT (TRANSURETHRAL RESECTION OF BLADDER TUMOR);  Surgeon: Watt Rush, MD;  Location: [facility] ORS;  Service: Urology;  Laterality: N/A;   TRANSURETHRAL RESECTION OF BLADDER TUMOR WITH MITOMYCIN -C Bilateral 01/06/2021   Procedure: TRANSURETHRAL RESECTION OF BLADDER TUMOR WITH GEMCITABINE  BILATERAL RETROGRADES;  Surgeon: Watt Rush, MD;  Location: [facility] ORS;  Service: Urology;  Laterality: Bilateral;   TRANSURETHRAL RESECTION OF PROSTATE  10/2003   TRANSURETHRAL RESECTION OF PROSTATE N/A 05/31/2020   Procedure: CYSTOSCOPY TRANSURETHRAL RESECTION BIOPSY AND FULGURATION OF PROSTATE LESION  (TURP);  Surgeon: Watt Rush, MD;  Location: [facility] ORS;  Service: Urology;  Laterality: N/A;    SOCIAL HISTORY: Social History   Socioeconomic History   Marital status: Married    Spouse name: Not on file   Number of children: 2   Years of education: Not  on file   Highest education level: Not on file  Occupational History   Occupation: [employer]   Occupation: Retired  Tobacco Use   Smoking status: Former    Current packs/day: 0.00    Average packs/day: 1.5 packs/day for 60.0 years (90.0 ttl pk-yrs)    Types: Cigarettes    Start date: 07/25/1948    Quit date: 07/25/2008    Years since quitting: 15.5   Smokeless tobacco: Never  Vaping Use   Vaping status: Never Used  Substance and Sexual Activity   Alcohol  use: Never   Drug use: Never   Sexual activity: Not Currently  Other Topics Concern   Not on file  Social History Narrative   Married second time   Retired [employer] n41 yrs   2 daughters and 2 stepdaughters   Social Drivers of Corporate investment banker Strain: Low Risk  (10/19/2022)   Overall Financial Resource Strain (CARDIA)    Difficulty of Paying Living Expenses: Not hard at all  Food Insecurity: No Food Insecurity (10/19/2022)   Hunger Vital Sign     Worried About Running Out of Food in the Last Year: Never true    Ran Out of Food in the Last Year: Never true  Transportation Needs: No Transportation Needs (10/19/2022)   PRAPARE - Administrator, Civil Service (Medical): No    Lack of Transportation (Non-Medical): No  Physical Activity: Insufficiently Active (10/19/2022)   Exercise Vital Sign    Days of Exercise per Week: 4 days    Minutes of Exercise per Session: 30 min  Stress: No Stress Concern Present (10/19/2022)   [employer] of Occupational Health - Occupational Stress Questionnaire    Feeling of Stress : Only a little  Social Connections: Moderately Isolated (10/19/2022)   Social Connection and Isolation Panel    Frequency of Communication with Friends and Family: More than three times a week    Frequency of Social Gatherings with Friends and Family: More than three times a week    Attends Religious Services: Never    Database administrator or Organizations: No    Attends Banker Meetings: Never    Marital Status: Married  Catering manager Violence: Not At Risk (10/19/2022)   Humiliation, Afraid, Rape, and Kick questionnaire    Fear of Current or Ex-Partner: No    Emotionally Abused: No    Physically Abused: No    Sexually Abused: No    FAMILY HISTORY: Family History  Problem Relation Age of Onset   Heart disease Father    Hypertension Mother    Asthma Paternal Grandmother    Colon cancer Neg Hx    Esophageal cancer Neg Hx    Stomach cancer Neg Hx    Rectal cancer Neg Hx    Liver cancer Neg Hx     ALLERGIES:  is allergic to lipitor [atorvastatin ] and other.  MEDICATIONS:  Current Outpatient Medications  Medication Sig Dispense Refill   acetaminophen  (TYLENOL ) 500 MG tablet Take 500-1,000 mg by mouth daily as needed for mild pain or headache.     ascorbic acid  (VITAMIN C ) 500 MG tablet Take 500 mg by mouth 2 (two) times a week.     carvedilol  (COREG ) 3.125 MG tablet TAKE 1 TABLET  BY MOUTH TWICE A DAY WITH A MEAL *TAKE WITH 6.25MG  TO EQUAL 9.375MG  TWICE A DAY* 180 tablet 0   carvedilol  (COREG ) 6.25 MG tablet TAKE 1 TABLET TWICE A  DAY *TAKE WITH 3.125MG  TO EQUAL 9.375MG  TWICE DAILY 180 tablet 0   Cholecalciferol  (VITAMIN D -3) 25 MCG (1000 UT) CAPS Take 1,000 Units by mouth daily.     cyanocobalamin  2000 MCG tablet Take 1 tablet (2,000 mcg total) by mouth daily. 90 tablet 1   dicyclomine  (BENTYL ) 10 MG capsule TAKE 1 CAPSULE (10 MG TOTAL) BY MOUTH EVERY 6 (SIX) HOURS AS NEEDED (ABDOMINAL PAIN). 30 capsule 1   ELIQUIS  5 MG TABS tablet TAKE 1 TABLET BY MOUTH TWICE A DAY 180 tablet 1   fexofenadine (ALLEGRA) 180 MG tablet Take 180 mg by mouth daily as needed for allergies or rhinitis.      Fluocinolone  Acetonide 0.01 % OIL 3 drops in affected ear(s) once or twice daily as needed for itching     fluticasone  (FLONASE ) 50 MCG/ACT nasal spray Place 2 sprays into both nostrils daily. (Patient taking differently: Place 2 sprays into both nostrils daily as needed for allergies.) 16 g 6   gabapentin  (NEURONTIN ) 300 MG capsule TAKE 1 CAPSULE BY MOUTH THREE TIMES A DAY 270 capsule 1   hydrALAZINE  (APRESOLINE ) 25 MG tablet Take 25 mg by mouth 3 (three) times daily as needed (for bp over 160).     hydrocortisone  (ANUSOL -HC) 2.5 % rectal cream Place 1 application rectally 3 (three) times daily as needed for hemorrhoids. 30 g 1   Hypromellose (ARTIFICIAL TEARS OP) Place 1 drop into both eyes 4 (four) times daily as needed (dry eyes).     ipratropium (ATROVENT ) 0.06 % nasal spray PLACE 2 SPRAYS INTO BOTH NOSTRILS 2 (TWO) TIMES DAILY AS NEEDED FOR RHINITIS. 135 mL 1   melatonin 5 MG TABS Take 5 mg by mouth at bedtime as needed (sleep).     nitroGLYCERIN  (NITROSTAT ) 0.4 MG SL tablet Place 1 tablet (0.4 mg total) under the tongue every 5 (five) minutes as needed for chest pain. 25 tablet 3   ondansetron  (ZOFRAN ) 8 MG tablet Take 1 tablet (8 mg total) by mouth every 8 (eight) hours as needed for  nausea or vomiting. 30 tablet 1   phenazopyridine  (PYRIDIUM ) 200 MG tablet Take 1 tablet (200 mg total) by mouth 3 (three) times daily as needed for pain. 20 tablet 0   prochlorperazine  (COMPAZINE ) 10 MG tablet Take 1 tablet (10 mg total) by mouth every 6 (six) hours as needed for nausea or vomiting. 30 tablet 1   rosuvastatin  (CRESTOR ) 10 MG tablet TAKE 1 TABLET BY MOUTH EVERY DAY 90 tablet 0   sacubitril -valsartan  (ENTRESTO ) 97-103 MG Take 0.5 tablets by mouth 2 (two) times daily.     sodium bicarbonate  650 MG tablet Take 1300 mg (two 650 mg tabs) the night before and morning of therapy. 24 tablet 1   sodium chloride  (OCEAN) 0.65 % SOLN nasal spray Place 1 spray into both nostrils as needed for congestion.     SPIRIVA  RESPIMAT 2.5 MCG/ACT AERS Inhale 2 puffs into the lungs daily as needed (shortness of breath).     triamcinolone cream (KENALOG) 0.1 % Apply 1 application topically daily as needed (to affected areas, for itching).      No current facility-administered medications for this visit.    REVIEW OF SYSTEMS:   Constitutional: ( - ) fevers, ( - )  chills , ( - ) night sweats Eyes: ( - ) blurriness of vision, ( - ) double vision, ( - ) watery eyes Ears, nose, mouth, throat, and face: ( - ) mucositis, ( - ) sore throat Respiratory: ( - )  cough, ( - ) dyspnea, ( - ) wheezes Cardiovascular: ( - ) palpitation, ( - ) chest discomfort, ( - ) lower extremity swelling Gastrointestinal:  ( - ) nausea, ( - ) heartburn, ( - ) change in bowel habits Skin: ( - ) abnormal skin rashes Lymphatics: ( - ) new lymphadenopathy, ( - ) easy bruising Neurological: ( - ) numbness, ( - ) tingling, ( - ) new weaknesses Behavioral/Psych: ( - ) mood change, ( - ) new changes  All other systems were reviewed with the patient and are negative.  PHYSICAL EXAMINATION: ECOG PERFORMANCE STATUS: 0 - Asymptomatic  Vitals:   02/21/24 1007  BP: 133/61  Pulse: 62  Resp: 13  Temp: (!) 97.4 F (36.3 C)  SpO2: 97%     Filed Weights   02/21/24 1007  Weight: 172 lb 3.2 oz (78.1 kg)     GENERAL: well appearing male in NAD  SKIN: skin color, texture, turgor are normal, no rashes or significant lesions EYES: conjunctiva are pink and non-injected, sclera clear.  LUNGS: clear to auscultation and percussion with normal breathing effort HEART: regular rate & rhythm and no murmurs. Trace R > L bilateral ankle edema Musculoskeletal: no cyanosis of digits and no clubbing  PSYCH: alert & oriented x 3, fluent speech NEURO: no focal motor/sensory deficits  LABORATORY DATA:  I have reviewed the data as listed    Latest Ref Rng & Units 02/21/2024    9:23 AM 01/24/2024    8:02 AM 12/27/2023    7:57 AM  CBC  WBC 4.0 - 10.5 K/uL 4.2  4.6  4.3   Hemoglobin 13.0 - 17.0 g/dL 86.8  87.0  87.2   Hematocrit 39.0 - 52.0 % 41.2  39.8  39.2   Platelets 150 - 400 K/uL 141  141  127        Latest Ref Rng & Units 02/21/2024    9:23 AM 01/24/2024    8:02 AM 12/27/2023    7:57 AM  CMP  Glucose 70 - 99 mg/dL 861  876  869   BUN 8 - 23 mg/dL 21  22  21    Creatinine 0.61 - 1.24 mg/dL 8.78  8.81  8.84   Sodium 135 - 145 mmol/L 142  143  141   Potassium 3.5 - 5.1 mmol/L 4.7  3.9  4.4   Chloride 98 - 111 mmol/L 105  107  104   CO2 22 - 32 mmol/L 32  31  33   Calcium  8.9 - 10.3 mg/dL 9.7  9.1  9.5   Total Protein 6.5 - 8.1 g/dL 7.3  6.8  7.0   Total Bilirubin 0.0 - 1.2 mg/dL 0.9  0.7  0.6   Alkaline Phos 38 - 126 U/L 52  47  52   AST 15 - 41 U/L 14  10  14    ALT 0 - 44 U/L 8  7  10      ASSESSMENT & PLAN Dillon Young is 89 y.o. male who presents for continued management of high grade urothelial carcinoma.  #cT1 High grade Non-muscle invasive bladder cancer, BCG refractory  --Initiated intravesical chemotherapy with gemcitabine  and doxcetaxel on 11/14/2023. --Gemcitabine  - Docetaxel  Protocol: Indwelling catheter is placed and the bladder is drained. 1 gram gemcitabine  in 50 cc saline instilled for 1 hour (foley is  capped). After 1 hour, uncap foley and drain the gemcitabine . Then instill docetaxel  (37.5 mg in 50 cc saline) for 1 hour. After docetaxel  instillation,  catheter can be removed and patient can void medication after leaving the office.  --Plan for weekly induction chemotherapy x 6 weeks and then transition to monthly maintenance chemotherapy x 24 months with cystoscopy q 3 months.  PLAN: --Due for Cycle 3, Day 1 today.  --Labs from today show WBC 4.2, hemoglobin 13.1, platelet 141, creatinine and LFTs are in range.  --Continue with Pyridium  200 mg the morning of treatment, then 3 times daily for 2 days as needed for pain --Continue with 1300 mg of sodium bicarbonate  (two 650 mg tabs) the night before and morning of therapy to alkalinize the urine. --Continue with premed with oxybutynin  and compazine  --Proceed with treatment today without any dose modifications --RTC in 4 weeks for a toxicity check prior to Cycle 4, Day 1.    #Post treatment fatige/appetite loss/dizziness: -- Developed these side effects cycle 2 of treatment. -- Advised to monitor closely with this cycle and follow-up in clinic if symptoms reoccur.  -- Continue to stay hydrated drinking 1-2L of water  per day.    No orders of the defined types were placed in this encounter.   All questions were answered. The patient knows to call the clinic with any problems, questions or concerns.  I have spent a total of 30 minutes minutes of face-to-face and non-face-to-face time, preparing to see the patient, performing a medically appropriate examination, counseling and educating the patient, documenting clinical information in the electronic health record, independently interpreting results and communicating results to the patient, and care coordination.   Johnston Police, PA-C Department of Hematology/Oncology [facility] at [facility] Phone: [phone]

## 2024-02-21 ENCOUNTER — Inpatient Hospital Stay: Admitting: Physician Assistant

## 2024-02-21 ENCOUNTER — Inpatient Hospital Stay

## 2024-02-21 ENCOUNTER — Other Ambulatory Visit: Payer: Self-pay | Admitting: *Deleted

## 2024-02-21 VITALS — BP 133/61 | HR 62 | Temp 97.4°F | Resp 13 | Wt 172.2 lb

## 2024-02-21 DIAGNOSIS — C678 Malignant neoplasm of overlapping sites of bladder: Secondary | ICD-10-CM | POA: Insufficient documentation

## 2024-02-21 DIAGNOSIS — N189 Chronic kidney disease, unspecified: Secondary | ICD-10-CM | POA: Diagnosis not present

## 2024-02-21 DIAGNOSIS — Z87442 Personal history of urinary calculi: Secondary | ICD-10-CM | POA: Diagnosis not present

## 2024-02-21 DIAGNOSIS — R319 Hematuria, unspecified: Secondary | ICD-10-CM

## 2024-02-21 DIAGNOSIS — G4730 Sleep apnea, unspecified: Secondary | ICD-10-CM | POA: Diagnosis not present

## 2024-02-21 DIAGNOSIS — E785 Hyperlipidemia, unspecified: Secondary | ICD-10-CM | POA: Diagnosis not present

## 2024-02-21 DIAGNOSIS — Z9079 Acquired absence of other genital organ(s): Secondary | ICD-10-CM | POA: Diagnosis not present

## 2024-02-21 DIAGNOSIS — Z7901 Long term (current) use of anticoagulants: Secondary | ICD-10-CM | POA: Insufficient documentation

## 2024-02-21 DIAGNOSIS — I509 Heart failure, unspecified: Secondary | ICD-10-CM | POA: Diagnosis not present

## 2024-02-21 DIAGNOSIS — N400 Enlarged prostate without lower urinary tract symptoms: Secondary | ICD-10-CM | POA: Diagnosis not present

## 2024-02-21 DIAGNOSIS — C679 Malignant neoplasm of bladder, unspecified: Secondary | ICD-10-CM

## 2024-02-21 DIAGNOSIS — Z923 Personal history of irradiation: Secondary | ICD-10-CM | POA: Insufficient documentation

## 2024-02-21 DIAGNOSIS — Z8673 Personal history of transient ischemic attack (TIA), and cerebral infarction without residual deficits: Secondary | ICD-10-CM | POA: Insufficient documentation

## 2024-02-21 DIAGNOSIS — Z87891 Personal history of nicotine dependence: Secondary | ICD-10-CM | POA: Diagnosis not present

## 2024-02-21 DIAGNOSIS — Z79899 Other long term (current) drug therapy: Secondary | ICD-10-CM | POA: Diagnosis not present

## 2024-02-21 DIAGNOSIS — Z860101 Personal history of adenomatous and serrated colon polyps: Secondary | ICD-10-CM | POA: Insufficient documentation

## 2024-02-21 DIAGNOSIS — Z5111 Encounter for antineoplastic chemotherapy: Secondary | ICD-10-CM | POA: Diagnosis not present

## 2024-02-21 DIAGNOSIS — I2510 Atherosclerotic heart disease of native coronary artery without angina pectoris: Secondary | ICD-10-CM | POA: Diagnosis not present

## 2024-02-21 DIAGNOSIS — I4891 Unspecified atrial fibrillation: Secondary | ICD-10-CM | POA: Insufficient documentation

## 2024-02-21 DIAGNOSIS — I130 Hypertensive heart and chronic kidney disease with heart failure and stage 1 through stage 4 chronic kidney disease, or unspecified chronic kidney disease: Secondary | ICD-10-CM | POA: Insufficient documentation

## 2024-02-21 LAB — CBC WITH DIFFERENTIAL (CANCER CENTER ONLY)
Abs Immature Granulocytes: 0.01 K/uL (ref 0.00–0.07)
Basophils Absolute: 0 K/uL (ref 0.0–0.1)
Basophils Relative: 1 %
Eosinophils Absolute: 0 K/uL (ref 0.0–0.5)
Eosinophils Relative: 1 %
HCT: 41.2 % (ref 39.0–52.0)
Hemoglobin: 13.1 g/dL (ref 13.0–17.0)
Immature Granulocytes: 0 %
Lymphocytes Relative: 20 %
Lymphs Abs: 0.8 K/uL (ref 0.7–4.0)
MCH: 26.5 pg (ref 26.0–34.0)
MCHC: 31.8 g/dL (ref 30.0–36.0)
MCV: 83.4 fL (ref 80.0–100.0)
Monocytes Absolute: 0.5 K/uL (ref 0.1–1.0)
Monocytes Relative: 11 %
Neutro Abs: 2.8 K/uL (ref 1.7–7.7)
Neutrophils Relative %: 67 %
Platelet Count: 141 K/uL — ABNORMAL LOW (ref 150–400)
RBC: 4.94 MIL/uL (ref 4.22–5.81)
RDW: 15.6 % — ABNORMAL HIGH (ref 11.5–15.5)
WBC Count: 4.2 K/uL (ref 4.0–10.5)
nRBC: 0 % (ref 0.0–0.2)

## 2024-02-21 LAB — CMP (CANCER CENTER ONLY)
ALT: 8 U/L (ref 0–44)
AST: 14 U/L — ABNORMAL LOW (ref 15–41)
Albumin: 4.4 g/dL (ref 3.5–5.0)
Alkaline Phosphatase: 52 U/L (ref 38–126)
Anion gap: 5 (ref 5–15)
BUN: 21 mg/dL (ref 8–23)
CO2: 32 mmol/L (ref 22–32)
Calcium: 9.7 mg/dL (ref 8.9–10.3)
Chloride: 105 mmol/L (ref 98–111)
Creatinine: 1.21 mg/dL (ref 0.61–1.24)
GFR, Estimated: 57 mL/min — ABNORMAL LOW (ref >60)
Glucose, Bld: 138 mg/dL — ABNORMAL HIGH (ref 70–99)
Potassium: 4.7 mmol/L (ref 3.5–5.1)
Sodium: 142 mmol/L (ref 135–145)
Total Bilirubin: 0.9 mg/dL (ref 0.0–1.2)
Total Protein: 7.3 g/dL (ref 6.5–8.1)

## 2024-02-21 LAB — URINALYSIS, COMPLETE (UACMP) WITH MICROSCOPIC
Bacteria, UA: NONE SEEN
Bilirubin Urine: NEGATIVE
Glucose, UA: NEGATIVE mg/dL
Ketones, ur: NEGATIVE mg/dL
Leukocytes,Ua: NEGATIVE
Nitrite: POSITIVE — AB
Protein, ur: 30 mg/dL — AB
RBC / HPF: >50 RBC/hpf (ref 0–5)
Specific Gravity, Urine: 1.012 (ref 1.005–1.030)
pH: 8 (ref 5.0–8.0)

## 2024-02-21 MED ORDER — SODIUM CHLORIDE (PF) 0.9 % IJ SOLN
1000.0000 mg | Freq: Once | INTRAVENOUS | Status: AC
  Administered 2024-02-21: 1000 mg via INTRAVESICAL
  Filled 2024-02-21: qty 26.3

## 2024-02-21 MED ORDER — OXYBUTYNIN CHLORIDE 5 MG PO TABS
5.0000 mg | ORAL_TABLET | Freq: Once | ORAL | Status: AC
  Administered 2024-02-21: 5 mg via ORAL
  Filled 2024-02-21: qty 1

## 2024-02-21 MED ORDER — PROCHLORPERAZINE MALEATE 10 MG PO TABS
10.0000 mg | ORAL_TABLET | Freq: Once | ORAL | Status: AC
  Administered 2024-02-21: 10 mg via ORAL
  Filled 2024-02-21: qty 1

## 2024-02-21 MED ORDER — SODIUM CHLORIDE (PF) 0.9 % IJ SOLN
37.5000 mg | Freq: Once | INTRAVENOUS | Status: AC
  Administered 2024-02-21: 37.5 mg via INTRAVESICAL
  Filled 2024-02-21: qty 3.75

## 2024-02-21 MED ORDER — LIDOCAINE HCL URETHRAL/MUCOSAL 2 % EX PRSY
1.0000 | PREFILLED_SYRINGE | Freq: Once | CUTANEOUS | Status: DC
  Filled 2024-02-21: qty 1

## 2024-02-21 MED ORDER — OXYBUTYNIN CHLORIDE 5 MG PO TABS
5.0000 mg | ORAL_TABLET | Freq: Once | ORAL | Status: AC | PRN
  Administered 2024-02-21: 5 mg via ORAL
  Filled 2024-02-21: qty 1

## 2024-02-21 NOTE — Patient Instructions (Signed)
 [facility] - A DEPT OF [facility]  Discharge Instructions: Thank you for choosing [facility] to provide your oncology and hematology care.   If you have a lab appointment with the Cancer Center, please go directly to the Cancer Center and check in at the registration area.   Wear comfortable clothing and clothing appropriate for easy access to any Portacath or PICC line.   We strive to give you quality time with your provider. You may need to reschedule your appointment if you arrive late (15 or more minutes).  Arriving late affects you and other patients whose appointments are after yours.  Also, if you miss three or more appointments without notifying the office, you may be dismissed from the clinic at the provider's discretion.      For prescription refill requests, have your pharmacy contact our office and allow 72 hours for refills to be completed.    Today you received the following chemotherapy and/or immunotherapy agents: docetaxel  and gemcitabine       To help prevent nausea and vomiting after your treatment, we encourage you to take your nausea medication as directed.  BELOW ARE SYMPTOMS THAT SHOULD BE REPORTED IMMEDIATELY: *FEVER GREATER THAN 100.4 F (38 C) OR HIGHER *CHILLS OR SWEATING *NAUSEA AND VOMITING THAT IS NOT CONTROLLED WITH YOUR NAUSEA MEDICATION *UNUSUAL SHORTNESS OF BREATH *UNUSUAL BRUISING OR BLEEDING *URINARY PROBLEMS (pain or burning when urinating, or frequent urination) *BOWEL PROBLEMS (unusual diarrhea, constipation, pain near the anus) TENDERNESS IN MOUTH AND THROAT WITH OR WITHOUT PRESENCE OF ULCERS (sore throat, sores in mouth, or a toothache) UNUSUAL RASH, SWELLING OR PAIN  UNUSUAL VAGINAL DISCHARGE OR ITCHING   Items with * indicate a potential emergency and should be followed up as soon as possible or go to the Emergency Department if any problems should occur.  Please show the CHEMOTHERAPY ALERT CARD  or IMMUNOTHERAPY ALERT CARD at check-in to the Emergency Department and triage nurse.  Should you have questions after your visit or need to cancel or reschedule your appointment, please contact [facility] - A DEPT OF [facility]  Dept: [phone]  and follow the prompts.  Office hours are 8:00 a.m. to 4:30 p.m. Monday - Friday. Please note that voicemails left after 4:00 p.m. may not be returned until the following business day.  We are closed weekends and major holidays. You have access to a nurse at all times for urgent questions. Please call the main number to the clinic Dept: [phone] and follow the prompts.   For any non-urgent questions, you may also contact your provider using MyChart. We now offer e-Visits for anyone 48 and older to request care online for non-urgent symptoms. For details visit [email or web address].   Also download the MyChart app! Go to the app store, search MyChart, open the app, select [place], and log in with your MyChart username and password.

## 2024-02-23 ENCOUNTER — Encounter: Payer: Self-pay | Admitting: Physician Assistant

## 2024-02-23 LAB — URINE CULTURE: Culture: 20000 — AB

## 2024-02-23 MED ORDER — SULFAMETHOXAZOLE-TRIMETHOPRIM 800-160 MG PO TABS
1.0000 | ORAL_TABLET | Freq: Two times a day (BID) | ORAL | 0 refills | Status: AC
Start: 2024-02-23 — End: 2024-03-01

## 2024-02-24 ENCOUNTER — Ambulatory Visit: Payer: Self-pay

## 2024-02-24 NOTE — Progress Notes (Signed)
 error

## 2024-02-25 ENCOUNTER — Other Ambulatory Visit: Payer: Self-pay | Admitting: Adult Health

## 2024-03-02 ENCOUNTER — Other Ambulatory Visit: Payer: Self-pay | Admitting: Adult Health

## 2024-03-02 ENCOUNTER — Other Ambulatory Visit: Payer: Self-pay | Admitting: Internal Medicine

## 2024-03-02 DIAGNOSIS — I10 Essential (primary) hypertension: Secondary | ICD-10-CM

## 2024-03-02 DIAGNOSIS — E785 Hyperlipidemia, unspecified: Secondary | ICD-10-CM

## 2024-03-02 DIAGNOSIS — I4819 Other persistent atrial fibrillation: Secondary | ICD-10-CM

## 2024-03-02 DIAGNOSIS — I519 Heart disease, unspecified: Secondary | ICD-10-CM

## 2024-03-02 DIAGNOSIS — I2510 Atherosclerotic heart disease of native coronary artery without angina pectoris: Secondary | ICD-10-CM

## 2024-03-03 ENCOUNTER — Telehealth: Payer: Self-pay | Admitting: *Deleted

## 2024-03-03 NOTE — Telephone Encounter (Signed)
 Dillon Young states that on Monday he started having pain, burning and aching in his bladder. Completed course of Bactrim .  Also states he has a small sore spot on his buttocks for ~ 8 weeks that will not heal. Saw dermatologist weeks ago and has been using cream everyday and it is not clearing.

## 2024-03-04 ENCOUNTER — Other Ambulatory Visit: Payer: Self-pay | Admitting: Adult Health

## 2024-03-05 ENCOUNTER — Other Ambulatory Visit: Payer: Self-pay

## 2024-03-05 DIAGNOSIS — N393 Stress incontinence (female) (male): Secondary | ICD-10-CM | POA: Diagnosis not present

## 2024-03-05 NOTE — Telephone Encounter (Signed)
 I am doing much better. I still have that sore on my bottom. We discussed possible causes, assured him I did not think it was his home chemo leaking as it would cause breakdown in other areas. We discussed removing his wallet before sitting he states that he does. We discussed the type of pants he wears. Discussed possible use of a donut to help relieve the pressure and give him the ability to sit straighter in the chair. Advised he get in touch with his dermatologist again. States he is using a Band-Aid again even though he was advised by the dermatologist said he did not need it.Encouraged him to call if he needs anything in the future. Arland Legions BSN RN

## 2024-03-06 ENCOUNTER — Ambulatory Visit (INDEPENDENT_AMBULATORY_CARE_PROVIDER_SITE_OTHER)

## 2024-03-06 DIAGNOSIS — Z23 Encounter for immunization: Secondary | ICD-10-CM

## 2024-03-06 NOTE — Progress Notes (Signed)
 Pt was given High dose flu vaccine with no complications.

## 2024-03-07 ENCOUNTER — Other Ambulatory Visit: Payer: Self-pay | Admitting: Adult Health

## 2024-03-08 ENCOUNTER — Encounter: Payer: Self-pay | Admitting: Cardiovascular Disease

## 2024-03-08 DIAGNOSIS — I5042 Chronic combined systolic (congestive) and diastolic (congestive) heart failure: Secondary | ICD-10-CM

## 2024-03-09 ENCOUNTER — Telehealth: Payer: Self-pay | Admitting: Pharmacy Technician

## 2024-03-09 ENCOUNTER — Other Ambulatory Visit (HOSPITAL_COMMUNITY): Payer: Self-pay

## 2024-03-09 MED ORDER — SACUBITRIL-VALSARTAN 49-51 MG PO TABS: 1.0000 | ORAL_TABLET | Freq: Two times a day (BID) | ORAL | 5 refills | Status: AC

## 2024-03-09 NOTE — Telephone Encounter (Signed)
   Generic non formulary

## 2024-03-10 ENCOUNTER — Telehealth: Payer: Self-pay | Admitting: Cardiovascular Disease

## 2024-03-10 NOTE — Telephone Encounter (Signed)
 Start Date: 01/14/23 End Date: 12/19/23 (ordered for 90 doses)  Discontinued by: Tina [name], MD on 12/19/2023 08:53    This medication was d/c'd on 12/19/23 by Oncology. The patient has continued to take Spironolactone  25 mg daily. Will forward to provider.

## 2024-03-10 NOTE — Telephone Encounter (Signed)
 Pt c/o medication issue:  1. Name of Medication: spironolactone  (ALDACTONE ) 25 MG tablet [[id number]]  DISCONTINUED   2. How are you currently taking this medication (dosage and times per day)? Once daily   3. Are you having a reaction (difficulty breathing--STAT)? No   4. What is your medication issue? Pt calling to get a refill of the above Rx, but it shows discontinued. Pt has still be taking the medication and is in need of more. Please advise.   If he is supposed to be taking the medication, he requests a refill be sent to [id number] - [place],  - [address]

## 2024-03-11 MED ORDER — SPIRONOLACTONE 25 MG PO TABS: 25.0000 mg | ORAL_TABLET | Freq: Every day | ORAL | 1 refills | Status: AC

## 2024-03-11 MED ORDER — SPIRONOLACTONE 25 MG PO TABS: 25.0000 mg | ORAL_TABLET | Freq: Every day | ORAL | 3 refills | Status: DC

## 2024-03-11 NOTE — Addendum Note (Signed)
 Addended by: BLUFORD RAMP D on: 03/11/2024 08:59 AM   Modules accepted: Orders

## 2024-03-11 NOTE — Telephone Encounter (Signed)
 [name], MD to [facility] Triage     03/11/24  7:24 AM Yes Davee Comer CROME, RN to [name], MD  Diarra Ceja L, RN (Selected Message)     03/10/24  5:23 PM   This medication was d/c'd on 12/19/23 by Oncology. The patient has continued to take Spironolactone  25 mg daily. Is it ok to refill this medication? Please advise, thanks  Refill sent in for pt.

## 2024-03-16 ENCOUNTER — Telehealth: Payer: Self-pay | Admitting: *Deleted

## 2024-03-16 ENCOUNTER — Inpatient Hospital Stay

## 2024-03-16 DIAGNOSIS — Z87440 Personal history of urinary (tract) infections: Secondary | ICD-10-CM | POA: Insufficient documentation

## 2024-03-16 DIAGNOSIS — C678 Malignant neoplasm of overlapping sites of bladder: Secondary | ICD-10-CM | POA: Diagnosis not present

## 2024-03-16 DIAGNOSIS — Z9221 Personal history of antineoplastic chemotherapy: Secondary | ICD-10-CM | POA: Insufficient documentation

## 2024-03-16 DIAGNOSIS — R32 Unspecified urinary incontinence: Secondary | ICD-10-CM | POA: Insufficient documentation

## 2024-03-16 DIAGNOSIS — R300 Dysuria: Secondary | ICD-10-CM

## 2024-03-16 LAB — URINALYSIS, COMPLETE (UACMP) WITH MICROSCOPIC
Bacteria, UA: NONE SEEN
Bilirubin Urine: NEGATIVE
Glucose, UA: NEGATIVE mg/dL
Ketones, ur: NEGATIVE mg/dL
Leukocytes,Ua: NEGATIVE
Nitrite: POSITIVE — AB
Protein, ur: NEGATIVE mg/dL
RBC / HPF: >50 RBC/hpf (ref 0–5)
Specific Gravity, Urine: 1.009 (ref 1.005–1.030)
pH: 6 (ref 5.0–8.0)

## 2024-03-16 NOTE — Telephone Encounter (Signed)
 Dillon Young states he is still having pain in his bladder. Has urgency, but not much urine output. Is taking pyridium  for the pain. Completed course of antibiotics a couple of weeks ago.   Next appt for labs and Dr Tina is 03/23/24

## 2024-03-16 NOTE — Telephone Encounter (Signed)
 Will come in today at 3:30 for UA and culture

## 2024-03-18 ENCOUNTER — Other Ambulatory Visit: Payer: Self-pay | Admitting: Physician Assistant

## 2024-03-18 ENCOUNTER — Telehealth: Payer: Self-pay | Admitting: *Deleted

## 2024-03-18 LAB — URINE CULTURE

## 2024-03-18 MED ORDER — LEVOFLOXACIN 750 MG PO TABS: 750.0000 mg | ORAL_TABLET | Freq: Every day | ORAL | 0 refills | Status: DC

## 2024-03-18 NOTE — Telephone Encounter (Signed)
 Notified that we have sent an antibiotic for him to start today for UTI.Verbalized understanding

## 2024-03-22 NOTE — Assessment & Plan Note (Addendum)
 Start maintenance today 8/29. Pyridium  200 mg the morning of treatment, then 3 times daily for 2 days as needed for pain.  Refill today 1300 mg of sodium bicarbonate  (two 650 mg tabs) the night before and morning of therapy to alkalinize the urine. Premed with oxybutynin  and compazine  Return with lab and visit during each maintenance dose Follow up with cystoscopy in about 3 months with Dr. Brunetta.

## 2024-03-22 NOTE — Progress Notes (Unsigned)
 [facility] OFFICE PROGRESS NOTE  Patient Care Team: [name], MD as PCP - General (Internal Medicine) [name], MD as PCP - Cardiology (Cardiology) [name], Toribio Young, [facility] (Inactive) (Pharmacist)  Dillon Young is a 89 y.o.male with history of prostate cancer with IMRT, NMIBC s/p BCG induction/maintenance, afib, CAD, chronic AC being seen at [facility] for NMIBC.    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 BCG refractory Previous treatment: BCG Current treatment: 11/15/23 - 12/27/23 6 weekly induction intravesical gemcitabine  docetaxel  He continues to have incontinence and leak at baseline.    Previous hematuria, holding anticoagulation.  This is resolved after resuming anticoagulation.   Starting maintenance intravesical gemcitabine  docetaxel  on 8/29. Assessment & Plan Malignant neoplasm of urinary bladder, unspecified site (HCC) Start maintenance today 8/29. Pyridium  200 mg the morning of treatment, then 3 times daily for 2 days as needed for pain.  Refill today 1300 mg of sodium bicarbonate  (two 650 mg tabs) the night before and morning of therapy to alkalinize the urine. Premed with oxybutynin  and compazine  Return with lab and visit during each maintenance dose Follow up with cystoscopy in about 3 months with Dr. Brunetta.  No orders of the defined types were placed in this encounter.    Dillon Young [name], MD  INTERVAL HISTORY: Patient returns for follow-up.  Oncology History  Bladder cancer (HCC)  05/31/2020 Pathology Results   A. PROSTATE AND PROSTATE URETHRA CHIPS, TURP:  High grade urothelial carcinoma  The carcinoma focally invades laminar propria  Muscularis propria (detrusor muscle) is present and not involves  Prostatic glands is not present    01/06/2021 Pathology Results   A. BLADDER, WALL, BIOPSY:  -  High-grade urothelial carcinoma, noninvasive  -  Detrusor muscle present but uninvolved by carcinoma   B. BLADDER, LEFT LATERAL WALL, BIOPSY:  -   High-grade urothelial carcinoma, noninvasive  -  No detrusor muscle identified    2022 - 2024 Chemotherapy   BCG Induction and maintenance.   01/10/2022 Initial Diagnosis   Bladder cancer (HCC)   09/20/2023 Pathology Results   BLADDER WALL, RIGHT LATERAL, BIOPSY:  High grade urothelial carcinoma  The carcinoma focally invades laminar propria  Muscularis propria (detrusor muscle) is not present    10/15/2023 Pathology Results   BLADDER TUMOR, TURBT:  Small fragments of high-grade urothelial carcinoma  Submucosa and muscularis propria with chronic inflammation  Negative for invasive carcinoma    10/29/2023 Cancer Staging   Staging form: Urinary Bladder, AJCC 8th Edition - Clinical: Stage I (cT1, cN0, cM0) - Signed by [name] Dillon BROCKS, MD on 10/29/2023 WHO/ISUP grade (low/high): High Grade Histologic grading system: 2 grade system   11/15/2023 -  Chemotherapy   Patient is on Treatment Plan : BLADDER Gemcitabine  (1000), Docetaxel  (37.5) INTRAVESICAL q7d        PHYSICAL EXAMINATION: ECOG PERFORMANCE STATUS: {CHL ONC ECOG PS:[phone]}  There were no vitals filed for this visit. There were no vitals filed for this visit.  GENERAL: alert, no distress and comfortable SKIN: skin color normal and no jaundice or bruising or petechiae on exposed skin EYES: normal, sclera clear OROPHARYNX: no exudate  NECK: No palpable mass LYMPH:  no palpable cervical, axillary lymphadenopathy  LUNGS: clear to auscultation and no wheeze or rales with normal breathing effort HEART: regular rate & rhythm  ABDOMEN: abdomen soft, non-tender and nondistended. Musculoskeletal: no edema NEURO: no focal motor/sensory deficits  Relevant data reviewed during this visit included labs.  New labs ordered.

## 2024-03-23 ENCOUNTER — Other Ambulatory Visit: Payer: Self-pay

## 2024-03-23 ENCOUNTER — Inpatient Hospital Stay

## 2024-03-23 VITALS — BP 121/49 | HR 74 | Temp 97.5°F | Resp 20 | Wt 169.5 lb

## 2024-03-23 DIAGNOSIS — C678 Malignant neoplasm of overlapping sites of bladder: Secondary | ICD-10-CM

## 2024-03-23 DIAGNOSIS — N390 Urinary tract infection, site not specified: Secondary | ICD-10-CM | POA: Diagnosis not present

## 2024-03-23 DIAGNOSIS — C679 Malignant neoplasm of bladder, unspecified: Secondary | ICD-10-CM | POA: Diagnosis not present

## 2024-03-23 DIAGNOSIS — R300 Dysuria: Secondary | ICD-10-CM

## 2024-03-23 DIAGNOSIS — R319 Hematuria, unspecified: Secondary | ICD-10-CM

## 2024-03-23 LAB — CMP (CANCER CENTER ONLY)
ALT: <5 U/L (ref 0–44)
AST: 13 U/L — ABNORMAL LOW (ref 15–41)
Albumin: 4.1 g/dL (ref 3.5–5.0)
Alkaline Phosphatase: 46 U/L (ref 38–126)
Anion gap: 7 (ref 5–15)
BUN: 26 mg/dL — ABNORMAL HIGH (ref 8–23)
CO2: 27 mmol/L (ref 22–32)
Calcium: 9.3 mg/dL (ref 8.9–10.3)
Chloride: 108 mmol/L (ref 98–111)
Creatinine: 1.37 mg/dL — ABNORMAL HIGH (ref 0.61–1.24)
GFR, Estimated: 49 mL/min — ABNORMAL LOW (ref >60)
Glucose, Bld: 130 mg/dL — ABNORMAL HIGH (ref 70–99)
Potassium: 3.9 mmol/L (ref 3.5–5.1)
Sodium: 142 mmol/L (ref 135–145)
Total Bilirubin: 0.7 mg/dL (ref 0.0–1.2)
Total Protein: 7.1 g/dL (ref 6.5–8.1)

## 2024-03-23 LAB — CBC WITH DIFFERENTIAL (CANCER CENTER ONLY)
Abs Immature Granulocytes: 0.01 K/uL (ref 0.00–0.07)
Basophils Absolute: 0 K/uL (ref 0.0–0.1)
Basophils Relative: 0 %
Eosinophils Absolute: 0.1 K/uL (ref 0.0–0.5)
Eosinophils Relative: 1 %
HCT: 38.3 % — ABNORMAL LOW (ref 39.0–52.0)
Hemoglobin: 12.3 g/dL — ABNORMAL LOW (ref 13.0–17.0)
Immature Granulocytes: 0 %
Lymphocytes Relative: 15 %
Lymphs Abs: 0.8 K/uL (ref 0.7–4.0)
MCH: 26.7 pg (ref 26.0–34.0)
MCHC: 32.1 g/dL (ref 30.0–36.0)
MCV: 83.3 fL (ref 80.0–100.0)
Monocytes Absolute: 0.5 K/uL (ref 0.1–1.0)
Monocytes Relative: 9 %
Neutro Abs: 3.8 K/uL (ref 1.7–7.7)
Neutrophils Relative %: 75 %
Platelet Count: 153 K/uL (ref 150–400)
RBC: 4.6 MIL/uL (ref 4.22–5.81)
RDW: 15.9 % — ABNORMAL HIGH (ref 11.5–15.5)
WBC Count: 5.1 K/uL (ref 4.0–10.5)
nRBC: 0 % (ref 0.0–0.2)

## 2024-03-23 LAB — URINALYSIS, COMPLETE (UACMP) WITH MICROSCOPIC
Bacteria, UA: NONE SEEN
Bilirubin Urine: NEGATIVE
Glucose, UA: NEGATIVE mg/dL
Ketones, ur: NEGATIVE mg/dL
Nitrite: POSITIVE — AB
Protein, ur: 30 mg/dL — AB
RBC / HPF: >50 RBC/hpf (ref 0–5)
Specific Gravity, Urine: 1.015 (ref 1.005–1.030)
pH: 6 (ref 5.0–8.0)

## 2024-03-23 NOTE — Assessment & Plan Note (Addendum)
 Patient was given a course of Levaquin last week. Concerning more likely erythema/inflammation in the bladder. Will follow-up with cystoscopy as planned in November

## 2024-03-24 ENCOUNTER — Other Ambulatory Visit: Payer: Self-pay | Admitting: Pulmonary Disease

## 2024-03-24 ENCOUNTER — Other Ambulatory Visit: Payer: Self-pay

## 2024-03-24 DIAGNOSIS — J439 Emphysema, unspecified: Secondary | ICD-10-CM

## 2024-03-24 LAB — URINE CULTURE: Culture: NO GROWTH

## 2024-03-25 ENCOUNTER — Encounter: Payer: Self-pay | Admitting: Internal Medicine

## 2024-03-25 ENCOUNTER — Other Ambulatory Visit: Payer: Self-pay

## 2024-03-25 ENCOUNTER — Other Ambulatory Visit: Payer: Self-pay | Admitting: Internal Medicine

## 2024-03-25 DIAGNOSIS — J301 Allergic rhinitis due to pollen: Secondary | ICD-10-CM

## 2024-03-25 MED ORDER — LEVOCETIRIZINE DIHYDROCHLORIDE 5 MG PO TABS: 5.0000 mg | ORAL_TABLET | Freq: Every evening | ORAL | 1 refills | Status: DC

## 2024-03-29 ENCOUNTER — Other Ambulatory Visit: Payer: Self-pay | Admitting: Internal Medicine

## 2024-03-29 DIAGNOSIS — E1140 Type 2 diabetes mellitus with diabetic neuropathy, unspecified: Secondary | ICD-10-CM

## 2024-04-01 ENCOUNTER — Other Ambulatory Visit: Payer: Self-pay

## 2024-04-03 ENCOUNTER — Encounter: Payer: Self-pay | Admitting: Cardiovascular Disease

## 2024-04-03 ENCOUNTER — Ambulatory Visit: Attending: Cardiovascular Disease | Admitting: Cardiovascular Disease

## 2024-04-03 VITALS — BP 146/64 | HR 79 | Ht 68.0 in | Wt 172.8 lb

## 2024-04-03 DIAGNOSIS — I4821 Permanent atrial fibrillation: Secondary | ICD-10-CM

## 2024-04-03 DIAGNOSIS — I519 Heart disease, unspecified: Secondary | ICD-10-CM

## 2024-04-03 DIAGNOSIS — I6523 Occlusion and stenosis of bilateral carotid arteries: Secondary | ICD-10-CM

## 2024-04-03 DIAGNOSIS — E785 Hyperlipidemia, unspecified: Secondary | ICD-10-CM | POA: Diagnosis not present

## 2024-04-03 DIAGNOSIS — I10 Essential (primary) hypertension: Secondary | ICD-10-CM | POA: Diagnosis not present

## 2024-04-03 DIAGNOSIS — I2510 Atherosclerotic heart disease of native coronary artery without angina pectoris: Secondary | ICD-10-CM

## 2024-04-03 NOTE — Assessment & Plan Note (Signed)
 History of dyslipidemia and intolerant to statin therapy although he is taking rosuvastatin  10 mg a day.  His most recent lipid profile performed 07/01/2023 revealed total cholesterol 118, LDL 51 and HDL 45.

## 2024-04-03 NOTE — Patient Instructions (Signed)
 Medication Instructions:  Your physician recommends that you continue on your current medications as directed. Please refer to the Current Medication list given to you today.  *If you need a refill on your cardiac medications before your next appointment, please call your pharmacy*   Follow-Up: At [facility], you and your health needs are our priority.  As part of our continuing mission to provide you with exceptional heart care, our providers are all part of one team.  This team includes your primary Cardiologist (physician) and Advanced Practice Providers or APPs (Physician Assistants and Nurse Practitioners) who all work together to provide you with the care you need, when you need it.  Your next appointment:   6 month(s)  Provider:   Damien Braver, NP         Then, Dorn Lesches, MD will plan to see you again in 12 month(s).     Other Instructions Follow up with A-fib clinic for annual visit.

## 2024-04-03 NOTE — Assessment & Plan Note (Signed)
 He has had cardioversion in the past by Dr. Okey 09/10/22 but had gone into A-fib after that and remains in A-fib.  He is on Eliquis  oral anticoagulation.

## 2024-04-03 NOTE — Assessment & Plan Note (Signed)
 History of left ventricular dysfunction in the past currently on GDMT.  His LV dysfunction has been court correlated with his A-fib.  His most recent 2D echo performed 09/30/2023 revealed EF of 55 to 60% without valvular abnormalities.  He denies symptoms of heart failure.

## 2024-04-03 NOTE — Assessment & Plan Note (Signed)
 History of remote right carotid endarterectomy with Dopplers performed 10/14/2023 revealed a widely patent endarterectomy site with moderate left ICA stenosis.  This should be repeated on an annual basis.

## 2024-04-03 NOTE — Progress Notes (Signed)
 04/03/2024 SUE MCALEXANDER   May 01, 1933  [id number]  Primary Physician [name], MD Primary Cardiologist: Dorn JINNY Lesches MD GENI CODY MADEIRA, FSCAI  HPI:  Dillon Young is a 89 y.o.  married Caucasian male father of 2 children, grandfather to 2 grandchildren he was formally a patient of Dr. Charlie Mariam. I last  saw him in the office 07/08/2023.  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 He works out 5 days a week and has joined [employer]. He has a history of coronary artery disease status post angioplasty in 1994 and 1995  Of his circumflex coronary artery. He has not required cardiac catheterization since stress Myoview  was performed in 2011 that was low risk and nonischemic. He has a history of hypertension and hyperlipidemia on medication. He had right carotid endarterectomy performed by Dr. Krystal Early June 2013 followed by duplex ultrasound. He denies chest pain or shortness of breath. His major complaint is of cramping in his thighs which may be related to his statin drug. He was given a statin holiday which really did not impact his symptoms and therefore statin was restarted and follow-up lipid profile performed 07/05/14 revealed a total cholesterol 131, LDL 58 and HDL of 44. Recent carotid Dopplers revealed his endarterectomy site was widely patent with moderate left ICA stenosis and moderate left subclavian artery stenosis without symptoms. He developed symptoms in August of this year and saw Leita Lobstein. A Myoview  stress test performed 01/09/16 was notable for ischemia in the RCA distribution. Based on this he underwent cardiac catheterization by Dr. Dann 01/16/16 revealing a 90% segmental mid dominant RCA stenosis which was stented with a Synergy 3.5 mm x 20 mm long drug-eluting stent. He did have a very small ramus branch that was 90% stenosed and not suitable for intervention as well as a 70% mid LAD  lesion although he had no evidence of anterior ischemia on his stress test. His symptoms of chest pain have resolved.    Since I saw him this months ago he is remained stable.  He denies chest pain or shortness of breath.  Does have mild ankle edema.  He is in newly recognized atrial fibrillation today.  He does relate a recent episode of hematuria which lasted for 10 days thought to be related related to his prostate and remote prostate cancer status post radioactive seed implantation.  He did see Dr. Watt , his urologist for evaluation and stopped his aspirin  briefly with resolution of his hematuria.  He has restarted dual anti-platelet therapy.   He  had a 2D echo that revealed decline in his LV function from 50 to 55% by his last 2D echo 2 years ago to 30 to 35%.  This may be related to his atrial fibrillation.  He feels weak and fatigued with some shortness of breath as well.   Because of relatively new onset chest pain he saw Shona Shad in the office he began him on oral nitrate.  His pain improved but he was referred for diagnostic coronary angiography to define his anatomy.   I performed cardiac catheterization on him 12/26/2017 revealing a patent RCA stent with high-grade mid LAD lesion which underwent PCI and drug-eluting stenting.  He was also found at that time by a left cheek log to have an EF of 25 to 30%.  He was in A. fib.  He subsequently undergone pharmacologic  cardioversion with Tikosyn .  He is on optimal medical therapy for his LV dysfunction and has seen Dr. Ezra Shuck in the advanced heart failure clinic as recently as 12/01/2018.   His ejection fraction increased to normal after cardioversion.  He was somewhat weak and bradycardic, and his carvedilol  was discontinued by Dr. Shuck recently resulting in improvement in his symptoms.  He denies chest pain or shortness of breath.  He was in sinus rhythm when he saw Dr. Shuck 12/03/2018 and again 11/01/2019.   He and his wife walk 3-4  times a week.  His most recent 2D echo performed l 05/16/2020 revealed preserved EF in the 50 to 55% range.  Event monitor showed 22% PVCs but no A. fib.  Carotid Dopplers performed 10/15/2019 did show moderate left ICA stenosis although his most recent carotid Doppler performed 10/02/2021 did not show ICA stenosis.  Since I saw him a year ago he continues to do well.    He still walks 3 miles a day 4 days a week with his wife Ronal who is 51 years old.  He denies chest pain or shortness of breath.  He had cardioversion by Dr. Okey 09/10/22 but has since gone back into A-fib.  He does complain of some mild ankle swelling and some congestion in the morning which resolves later in the day.  His most recent echo performed 09/30/2023 revealed EF of 55 to 60% unchanged from his prior study.   Current Meds  Medication Sig   acetaminophen  (TYLENOL ) 500 MG tablet Take 500-1,000 mg by mouth daily as needed for mild pain or headache.   ascorbic acid  (VITAMIN C ) 500 MG tablet Take 500 mg by mouth 2 (two) times a week.   carvedilol  (COREG ) 3.125 MG tablet TAKE 1 TABLET BY MOUTH TWICE A DAY WITH A MEAL *TAKE WITH 6.25MG  TO EQUAL 9.375MG  TWICE A DAY*   carvedilol  (COREG ) 6.25 MG tablet TAKE 1 TABLET TWICE A DAY *TAKE WITH 3.125MG  TO EQUAL 9.375MG  TWICE DAILY   Cholecalciferol  (VITAMIN D -3) 25 MCG (1000 UT) CAPS Take 1,000 Units by mouth daily.   cyanocobalamin  2000 MCG tablet Take 1 tablet (2,000 mcg total) by mouth daily.   dicyclomine  (BENTYL ) 10 MG capsule TAKE 1 CAPSULE (10 MG TOTAL) BY MOUTH EVERY 6 (SIX) HOURS AS NEEDED (ABDOMINAL PAIN).   ELIQUIS  5 MG TABS tablet TAKE 1 TABLET BY MOUTH TWICE A DAY   fexofenadine (ALLEGRA) 180 MG tablet Take 180 mg by mouth daily as needed for allergies or rhinitis.    Fluocinolone  Acetonide 0.01 % OIL 3 drops in affected ear(s) once or twice daily as needed for itching   fluticasone  (FLONASE ) 50 MCG/ACT nasal spray Place 2 sprays into both nostrils daily. (Patient taking  differently: Place 2 sprays into both nostrils daily as needed for allergies.)   gabapentin  (NEURONTIN ) 300 MG capsule TAKE 1 CAPSULE BY MOUTH THREE TIMES A DAY   hydrALAZINE  (APRESOLINE ) 25 MG tablet Take 25 mg by mouth 3 (three) times daily as needed (for bp over 160).   hydrocortisone  (ANUSOL -HC) 2.5 % rectal cream Place 1 application rectally 3 (three) times daily as needed for hemorrhoids.   Hypromellose (ARTIFICIAL TEARS OP) Place 1 drop into both eyes 4 (four) times daily as needed (dry eyes).   ipratropium (ATROVENT ) 0.06 % nasal spray PLACE 2 SPRAYS INTO BOTH NOSTRILS 2 (TWO) TIMES DAILY AS NEEDED FOR RHINITIS.   levocetirizine (XYZAL ) 5 MG tablet Take 1 tablet (5 mg total) by mouth every evening.  levofloxacin (LEVAQUIN) 750 MG tablet Take 1 tablet (750 mg total) by mouth daily.   melatonin 5 MG TABS Take 5 mg by mouth at bedtime as needed (sleep).   nitroGLYCERIN  (NITROSTAT ) 0.4 MG SL tablet Place 1 tablet (0.4 mg total) under the tongue every 5 (five) minutes as needed for chest pain.   ondansetron  (ZOFRAN ) 8 MG tablet Take 1 tablet (8 mg total) by mouth every 8 (eight) hours as needed for nausea or vomiting.   phenazopyridine  (PYRIDIUM ) 200 MG tablet Take 1 tablet (200 mg total) by mouth 3 (three) times daily as needed for pain.   prochlorperazine  (COMPAZINE ) 10 MG tablet Take 1 tablet (10 mg total) by mouth every 6 (six) hours as needed for nausea or vomiting.   rosuvastatin  (CRESTOR ) 10 MG tablet TAKE 1 TABLET BY MOUTH EVERY DAY   sacubitril -valsartan  (ENTRESTO ) 49-51 MG Take 1 tablet by mouth 2 (two) times daily.   sodium bicarbonate  650 MG tablet Take 1300 mg (two 650 mg tabs) the night before and morning of therapy.   sodium chloride  (OCEAN) 0.65 % SOLN nasal spray Place 1 spray into both nostrils as needed for congestion.   SPIRIVA  RESPIMAT 2.5 MCG/ACT AERS Inhale 2 puffs into the lungs daily as needed (shortness of breath).   spironolactone  (ALDACTONE ) 25 MG tablet Take 1  tablet (25 mg total) by mouth daily.   triamcinolone cream (KENALOG) 0.1 % Apply 1 application topically daily as needed (to affected areas, for itching).      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) [employer]   Occupation: Retired  Tobacco Use   Smoking status: Former    Current packs/day: 0.00    Average packs/day: 1.5 packs/day for 60.0 years (90.0 ttl pk-yrs)    Types: Cigarettes    Start date: 07/25/1948    Quit date: 07/25/2008    Years since quitting: 15.7   Smokeless tobacco: Never  Vaping Use   Vaping status: Never Used  Substance and Sexual Activity   Alcohol  use: Never   Drug use: Never   Sexual activity: Not Currently  Other Topics Concern   Not on file  Social History Narrative   Married second time   Retired [employer] n41 yrs   2 daughters and 2 stepdaughters   Social Drivers of Corporate Investment Banker Strain: Low Risk  (10/19/2022)   Overall Financial Resource Strain (CARDIA)    Difficulty of Paying Living Expenses: Not hard at all  Food Insecurity: No Food Insecurity (10/19/2022)   Hunger Vital Sign    Worried About Running Out of Food in the Last Year: Never true    Ran Out of Food in the Last Year: Never true  Transportation Needs: No Transportation Needs (10/19/2022)   PRAPARE - Administrator, Civil Service (Medical): No    Lack of Transportation (Non-Medical): No  Physical Activity: Insufficiently Active (10/19/2022)   Exercise Vital Sign    Days of Exercise per Week: 4 days    Minutes of Exercise per Session: 30  min  Stress: No Stress Concern Present  (10/19/2022)   [employer] of Occupational Health - Occupational Stress Questionnaire    Feeling of Stress : Only a little  Social Connections: Moderately Isolated (10/19/2022)   Social Connection and Isolation Panel    Frequency of Communication with Friends and Family: More than three times a week    Frequency of Social Gatherings with Friends and Family: More than three times a week    Attends Religious Services: Never    Database Administrator or Organizations: No    Attends Banker Meetings: Never    Marital Status: Married  Catering Manager Violence: Not At Risk (10/19/2022)   Humiliation, Afraid, Rape, and Kick questionnaire    Fear of Current or Ex-Partner: No    Emotionally Abused: No    Physically Abused: No    Sexually Abused: No     Review of Systems: General: negative for chills, fever, night sweats or weight changes.  Cardiovascular: negative for chest pain, dyspnea on exertion, edema, orthopnea, palpitations, paroxysmal nocturnal dyspnea or shortness of breath Dermatological: negative for rash Respiratory: negative for cough or wheezing Urologic: negative for hematuria Abdominal: negative for nausea, vomiting, diarrhea, bright red blood per rectum, melena, or hematemesis Neurologic: negative for visual changes, syncope, or dizziness All other systems reviewed and are otherwise negative except as noted above.    Blood pressure (!) 146/64, pulse 79, height 5' 8 (1.727 m), weight 172 lb 12.8 oz (78.4 kg), SpO2 97%.  General appearance: alert and no distress Neck: no adenopathy, no carotid bruit, no JVD, supple, symmetrical, trachea midline, and thyroid  not enlarged, symmetric, no tenderness/mass/nodules Lungs: clear to auscultation bilaterally Heart: irregularly irregular rhythm Extremities: extremities normal, atraumatic, no cyanosis or edema Pulses: 2+ and symmetric Skin: Skin color, texture, turgor normal. No rashes or lesions Neurologic: Grossly  normal  EKG not performed today      ASSESSMENT AND PLAN:   Hyperlipidemia with target LDL less than 70 History of dyslipidemia and intolerant to statin therapy although he is taking rosuvastatin  10 mg a day.  His most recent lipid profile performed 07/01/2023 revealed total cholesterol 118, LDL 51 and HDL 45.  Essential hypertension History of essential hypertension blood pressure measured today at 146/64.  He did provide a blood blood pressure log that showed his blood pressures are all within normal range.  His medications include carvedilol  and Entresto  as well as spironolactone .  Left ventricular dysfunction History of left ventricular dysfunction in the past currently on GDMT.  His LV dysfunction has been court correlated with his A-fib.  His most recent 2D echo performed 09/30/2023 revealed EF of 55 to 60% without valvular abnormalities.  He denies symptoms of heart failure.  CAD (coronary artery disease) History of CAD status post angioplasty back in 9495 of his circumflex coronary artery.  He had intervention by Dr. Dann 01/16/2016 revealing high-grade RCA stenosis which was stented with a Synergy 3.5 x 20 mm long drug-eluting stent.  He had a small ramus branch that had 90% stenosis not suitable for intervention as well as a 70% mid LAD lesion but no evidence of anterior ischemia.  He is remained stable and denies chest pain.  I performed cardiac catheter station on him 12/26/2017 because of new onset chest pain.  This revealed a patent RCA stent with high-grade mid LAD lesion which I stented.  His EF at that time was in the 25 to 30% range.  He is fairly active and currently  denies chest pain or shortness of breath.  Permanent atrial fibrillation [facility]) He has had cardioversion in the past by Dr. Okey 09/10/22 but had gone into A-fib after that and remains in A-fib.  He is on Eliquis  oral anticoagulation.  Carotid artery disease- s/p RCE June 2013 History of remote right carotid  endarterectomy with Dopplers performed 10/14/2023 revealed a widely patent endarterectomy site with moderate left ICA stenosis.  This should be repeated on an annual basis.     Dorn DOROTHA Lesches MD FACP,FACC,FAHA, [facility] 04/03/2024 10:50 AM

## 2024-04-03 NOTE — Assessment & Plan Note (Signed)
 History of essential hypertension blood pressure measured today at 146/64.  He did provide a blood blood pressure log that showed his blood pressures are all within normal range.  His medications include carvedilol  and Entresto  as well as spironolactone .

## 2024-04-03 NOTE — Assessment & Plan Note (Signed)
 History of CAD status post angioplasty back in 9495 of his circumflex coronary artery.  He had intervention by Dr. Dann 01/16/2016 revealing high-grade RCA stenosis which was stented with a Synergy 3.5 x 20 mm long drug-eluting stent.  He had a small ramus branch that had 90% stenosis not suitable for intervention as well as a 70% mid LAD lesion but no evidence of anterior ischemia.  He is remained stable and denies chest pain.  I performed cardiac catheter station on him 12/26/2017 because of new onset chest pain.  This revealed a patent RCA stent with high-grade mid LAD lesion which I stented.  His EF at that time was in the 25 to 30% range.  He is fairly active and currently denies chest pain or shortness of breath.

## 2024-04-07 DIAGNOSIS — R399 Unspecified symptoms and signs involving the genitourinary system: Secondary | ICD-10-CM | POA: Diagnosis not present

## 2024-04-07 DIAGNOSIS — Z8551 Personal history of malignant neoplasm of bladder: Secondary | ICD-10-CM | POA: Diagnosis not present

## 2024-04-08 ENCOUNTER — Ambulatory Visit (HOSPITAL_COMMUNITY)
Admission: RE | Admit: 2024-04-08 | Discharge: 2024-04-08 | Disposition: A | Discharge status: Home / Self Care | Source: Ambulatory Visit | Attending: Internal Medicine | Admitting: Internal Medicine

## 2024-04-08 ENCOUNTER — Encounter (HOSPITAL_COMMUNITY): Payer: Self-pay | Admitting: Internal Medicine

## 2024-04-08 VITALS — BP 150/90 | HR 115 | Ht 68.0 in | Wt 173.6 lb

## 2024-04-08 DIAGNOSIS — D6869 Other thrombophilia: Secondary | ICD-10-CM

## 2024-04-08 DIAGNOSIS — I4821 Permanent atrial fibrillation: Secondary | ICD-10-CM | POA: Diagnosis not present

## 2024-04-08 MED ORDER — CARVEDILOL 12.5 MG PO TABS
12.5000 mg | ORAL_TABLET | Freq: Two times a day (BID) | ORAL | 3 refills | Status: AC
Start: 2024-04-08 — End: ?

## 2024-04-08 NOTE — Progress Notes (Signed)
 Primary Care Physician: Dillon Debby CROME, MD Referring Physician:Dr. Court AHF:Dr. Rolan Fallow MASAJI Young is a 89 y.o. male with a h/o coronary artery disease status post angioplasty in 1994 and 1995 of his circumflex coronary artery. . He has a history of hypertension and hyperlipidemia on medication. He had right carotid endarterectomy performed by Dr. Krystal Young June 2013.Myoview  stress test performed 01/09/16 was notable for ischemia in the RCA distribution. Based on this he underwent cardiac catheterization by Dr. Dann 01/16/16 revealing a 90% segmental mid dominant RCA stenosis which was stented with a Synergy 3.5 mm x 20 mm long drug-eluting stent. He did have a very small ramus branch that was 90% stenosed and not suitable for intervention as well as a 70% mid LAD lesion although he had no evidence of anterior ischemia on his stress test. His symptoms of chest pain have resolved.   He was diagnosed in May 2019 with new  onset  afib from which the pt is asymptomatic. Echo was done and showed EF decreased to 30-35%, normal EF by echo in August 2017. He has been on Eliquis  5 mg bid since Young May without missed doses.  Plavix  were stopped at that time, ASA continued and pt was started on carvedilol  6.25 mg bid.He states that he had a sleep study in the past but never slept enough with the study to get good results. He is afraid that he would never be able to tolerate a cpap. Sleeps in a recliner now,  I just feel more comfortable.  F/u in afib clinic 10/03/17, unfortunately cardioversion only held for 1-2 days.He developed  exertional chest discomfort   and was seen by R. Barrett, PA and had a cardiac cath which pt did receive a stent in the LAD. He feels improved but still remains in afib.  He did have to hold anticoagulation for 1-2 days and was back on anticoagulation without interruption on 8/23.  We did discuss trying to restore SR with EF moderately reduced and he would like to purse  after his vacation at the end of the month.  F/u in afib clinic, 01/31/18. He is back in afib clinic to discuss Tikosyn  admit.  He has recently seen Dr. Rolan and he also encouraged pt to consider Tiksoyn for his LV dysfunction.Dillon Young He is feeling better and was hoping he could get by without drug. He will commit but wants to wait until 10/1. He also reports that he has been having h/a's ever since he started Imdur . I discussed with Dillon Young, and  Imdur  was eventually stopped with resolution of H/A.  Dillon Young is now back in afib clinic for tikosyn  admit.  No misses eliquis  doses and no benadryl  use.Dillon Young has screened drugs and no qt prolonging drugs. He will be applying for drug assistance and papers received and reviewed.  F/u 10/28, s/p Tikosyn  admit. He is in SR with 125 mcg of Tikosyn . He will be getting drug thru pharmacy  assistance program. He feels improved.  F/u Tikosyn  11/21, he continues in SR. He has a sinus infection and is taking cortisone and antibiotic currently. qtc 463 ms. By Dillon Young he appears to have bigeminal PVC's but rhythm was regular when auscultated.  F/u in  afib clinic, 07/26/22. He was sent her by Dr. Court as he found him in afib  2/21. He is rate controled and minimally symptomatic with fatigue . He continues on tikosyn  125 mcg bid. EKG today shows rate controlled afib.  F/u in afib clinic, 08/07/22. He returned to afib in 3 days after CV. He feels weak in afib. We discussed today  tikosyn  seems to have lost its effectiveness to control afib and to wash out Tikosyn  and load amiodarone  with a repeat CV in one month if he wants to restore SR  and he is in agreement,  risk vrs benefit of drug explained  and he wants to proceed. Most recent TSH/cmet/CXR in Epic reviewed.   F/u in afib clinic, 09/12/22. He is now s/p DCCV on 4/15 with successful conversion to SR x 1 shock. He is currently in SR today. Low HR noted today. He does feel tired, some head tightness. No missed doses of  anticoagulant.  F/u in afib clinic, 09/27/22. He is currently out of rhythm today since transition from amio 200 mg BID to once daily. He feels well overall and HR improved. Occasionally feels fluttering at night but this is seldom. No missed doses of anticoagulant.  F/u in Afib clinic, 04/11/23. He is currently in rate controlled Afib. He feels okay overall. He can do his daily activities without issue and walks with his wife 4 days a week. Highest BP at home is systolic 138; HR 29-19d; systolic BP 110-120s. No bleeding issues on Eliquis .  Follow in Afib clinic, 04/08/24. Patient is currently in rate controlled Afib. He is able to perform his daily activities without much issue. He has noted over the past few days elevated HR but unsure why. No bleeding issues on Eliquis .   Today, he denies symptoms of palpitations, shortness of breath, orthopnea, PND, lower extremity edema, dizziness, presyncope, syncope, or neurologic sequela.  The patient is tolerating medications without difficulties and is otherwise without complaint today.   Past Medical History:  Diagnosis Date   Adenomatous colon polyp    Allergic rhinitis    Anemia    Bladder cancer (HCC)    currently on bcg treatments   BPH (benign prostatic hyperplasia)    CAD (coronary artery disease)    sees Dr. Dorn Lesches    Carotid artery disease    CHF (congestive heart failure) (HCC)    Chronic kidney disease    Sones. Prostate cancer   Clotting disorder    CVA (cerebral infarction)    Diverticulitis    Dysrhythmia    atrial fib - hx of    Edema    recent swelling in feet and ankles    Hemorrhoids    History of kidney stones    HTN (hypertension)    Hx of radiation therapy    for prostate - 2013    Hyperlipidemia LDL goal <70    IBS (irritable bowel syndrome)    Low back pain    Pre-diabetes    Prostate cancer (HCC) 2008   seed implantation   Sleep apnea    sitting in recliner helps him to breathe per pt , never used  cpap sleep study - pt reports could not go to sleep- 6 years ago    Smoker    Stroke [facility])    hx of mini strokes years ago    Past Surgical History:  Procedure Laterality Date   APPENDECTOMY     CARDIAC CATHETERIZATION N/A 01/16/2016   Procedure: Left Heart Cath and Coronary Angiography;  Surgeon: Candyce GORMAN Reek, MD;  Location[facility] INVASIVE CV LAB;  Service: Cardiovascular;  Laterality: N/A;   CARDIAC CATHETERIZATION N/A 01/16/2016   Procedure: Coronary Stent Intervention;  Surgeon: Candyce GORMAN Reek, MD;  Location: [facility] INVASIVE CV LAB;  Service: Cardiovascular;  Laterality: N/A;   CARDIOVERSION N/A 11/26/2017   Procedure: CARDIOVERSION;  Surgeon: Mona Vinie BROCKS, MD;  Location: [facility] ENDOSCOPY;  Service: Cardiovascular;  Laterality: N/A;   CARDIOVERSION N/A 03/13/2018   Procedure: CARDIOVERSION;  Surgeon: Mona Vinie BROCKS, MD;  Location: [facility] ENDOSCOPY;  Service: Cardiovascular;  Laterality: N/A;   CARDIOVERSION N/A 07/31/2022   Procedure: CARDIOVERSION;  Surgeon: Pietro Redell RAMAN, MD;  Location: [facility] ENDOSCOPY;  Service: Cardiovascular;  Laterality: N/A;   CARDIOVERSION N/A 09/10/2022   Procedure: CARDIOVERSION;  Surgeon: Okey Vina GAILS, MD;  Location: [facility] INVASIVE CV LAB;  Service: Cardiovascular;  Laterality: N/A;   CATARACT EXTRACTION W/ INTRAOCULAR LENS  IMPLANT, BILATERAL Bilateral    COLONOSCOPY  10/21/2008   diverticulosis, radiation proctitis, internal hemorrhoids   CORONARY ANGIOPLASTY     CORONARY STENT INTERVENTION N/A 12/26/2017   Procedure: CORONARY STENT INTERVENTION;  Surgeon: Dillon Young Dorn PARAS, MD;  Location: [facility] INVASIVE CV LAB;  Service: Cardiovascular;  Laterality: N/A;   CYSTOSCOPY WITH BIOPSY N/A 09/10/2023   Procedure: CYSTOSCOPY, WITH BIOPSY/BILATERAL RETROGRADE;  Surgeon: Watt Rush, MD;  Location: [facility] ORS;  Service: Urology;  Laterality: N/A;  BILATERAL RETROGRADE   ENDARTERECTOMY  11/12/2011   Procedure: ENDARTERECTOMY CAROTID;  Surgeon: Dillon JULIANNA Doing, MD;  Location: [facility] OR;   Service: Vascular;  Laterality: Right;   ESOPHAGOGASTRODUODENOSCOPY (EGD) WITH ESOPHAGEAL DILATION     HEMORRHOID BANDING     LEFT HEART CATH AND CORONARY ANGIOGRAPHY N/A 12/26/2017   Procedure: LEFT HEART CATH AND CORONARY ANGIOGRAPHY;  Surgeon: Dillon Young Dorn PARAS, MD;  Location: [facility] INVASIVE CV LAB;  Service: Cardiovascular;  Laterality: N/A;   LITHOTRIPSY  X 3   LUMBAR LAMINECTOMY     TRANSURETHRAL RESECTION OF BLADDER TUMOR N/A 10/15/2023   Procedure: TURBT (TRANSURETHRAL RESECTION OF BLADDER TUMOR);  Surgeon: Watt Rush, MD;  Location: [facility] ORS;  Service: Urology;  Laterality: N/A;   TRANSURETHRAL RESECTION OF BLADDER TUMOR WITH MITOMYCIN -C Bilateral 01/06/2021   Procedure: TRANSURETHRAL RESECTION OF BLADDER TUMOR WITH GEMCITABINE  BILATERAL RETROGRADES;  Surgeon: Watt Rush, MD;  Location: [facility] ORS;  Service: Urology;  Laterality: Bilateral;   TRANSURETHRAL RESECTION OF PROSTATE  10/2003   TRANSURETHRAL RESECTION OF PROSTATE N/A 05/31/2020   Procedure: CYSTOSCOPY TRANSURETHRAL RESECTION BIOPSY AND FULGURATION OF PROSTATE LESION  (TURP);  Surgeon: Watt Rush, MD;  Location: [facility] ORS;  Service: Urology;  Laterality: N/A;    Current Outpatient Medications  Medication Sig Dispense Refill   acetaminophen  (TYLENOL ) 500 MG tablet Take 500-1,000 mg by mouth daily as needed for mild pain or headache.     ascorbic acid  (VITAMIN C ) 500 MG tablet Take 500 mg by mouth 2 (two) times a week.     Cholecalciferol  (VITAMIN D -3) 25 MCG (1000 UT) CAPS Take 1,000 Units by mouth daily.     cyanocobalamin  2000 MCG tablet Take 1 tablet (2,000 mcg total) by mouth daily. 90 tablet 1   dicyclomine  (BENTYL ) 10 MG capsule TAKE 1 CAPSULE (10 MG TOTAL) BY MOUTH EVERY 6 (SIX) HOURS AS NEEDED (ABDOMINAL PAIN). 30 capsule 1   ELIQUIS  5 MG TABS tablet TAKE 1 TABLET BY MOUTH TWICE A DAY 180 tablet 1   fexofenadine (ALLEGRA) 180 MG tablet Take 180 mg by mouth daily as needed for allergies or rhinitis.      Fluocinolone  Acetonide 0.01  % OIL 3 drops in affected ear(s) once or twice daily as needed for itching     fluticasone  (FLONASE ) 50 MCG/ACT nasal spray  Place 2 sprays into both nostrils daily. (Patient taking differently: Place 2 sprays into both nostrils daily as needed for allergies.) 16 g 6   gabapentin  (NEURONTIN ) 300 MG capsule TAKE 1 CAPSULE BY MOUTH THREE TIMES A DAY 270 capsule 1   hydrALAZINE  (APRESOLINE ) 25 MG tablet Take 25 mg by mouth 3 (three) times daily as needed (for bp over 160).     hydrocortisone  (ANUSOL -HC) 2.5 % rectal cream Place 1 application rectally 3 (three) times daily as needed for hemorrhoids. 30 g 1   Hypromellose (ARTIFICIAL TEARS OP) Place 1 drop into both eyes 4 (four) times daily as needed (dry eyes).     ipratropium (ATROVENT ) 0.06 % nasal spray PLACE 2 SPRAYS INTO BOTH NOSTRILS 2 (TWO) TIMES DAILY AS NEEDED FOR RHINITIS. 135 mL 1   levocetirizine (XYZAL ) 5 MG tablet Take 1 tablet (5 mg total) by mouth every evening. 90 tablet 1   melatonin 5 MG TABS Take 5 mg by mouth at bedtime as needed (sleep).     nitroGLYCERIN  (NITROSTAT ) 0.4 MG SL tablet Place 1 tablet (0.4 mg total) under the tongue every 5 (five) minutes as needed for chest pain. 25 tablet 3   ondansetron  (ZOFRAN ) 8 MG tablet Take 1 tablet (8 mg total) by mouth every 8 (eight) hours as needed for nausea or vomiting. 30 tablet 1   phenazopyridine  (PYRIDIUM ) 200 MG tablet Take 1 tablet (200 mg total) by mouth 3 (three) times daily as needed for pain. 20 tablet 0   prochlorperazine  (COMPAZINE ) 10 MG tablet Take 1 tablet (10 mg total) by mouth every 6 (six) hours as needed for nausea or vomiting. 30 tablet 1   rosuvastatin  (CRESTOR ) 10 MG tablet TAKE 1 TABLET BY MOUTH EVERY DAY 90 tablet 1   sacubitril -valsartan  (ENTRESTO ) 49-51 MG Take 1 tablet by mouth 2 (two) times daily. 60 tablet 5   sodium bicarbonate  650 MG tablet Take 1300 mg (two 650 mg tabs) the night before and morning of therapy. 24 tablet 1   sodium chloride  (OCEAN) 0.65 %  SOLN nasal spray Place 1 spray into both nostrils as needed for congestion.     SPIRIVA  RESPIMAT 2.5 MCG/ACT AERS Inhale 2 puffs into the lungs daily as needed (shortness of breath).     spironolactone  (ALDACTONE ) 25 MG tablet Take 1 tablet (25 mg total) by mouth daily. 90 tablet 1   triamcinolone cream (KENALOG) 0.1 % Apply 1 application topically daily as needed (to affected areas, for itching).      No current facility-administered medications for this encounter.    Allergies  Allergen Reactions   Lipitor [Atorvastatin ] Other (See Comments)    Muscle pain   Other Rash and Other (See Comments)    Detergent- Rashes on the skin   ROS- All systems are reviewed and negative except as per the HPI above  Physical Exam: Vitals:   04/08/24 1425  BP: (!) 150/90  Pulse: (!) 115  Weight: 78.7 kg  Height: 5' 8 (1.727 m)    Wt Readings from Last 3 Encounters:  04/08/24 78.7 kg  04/03/24 78.4 kg  03/23/24 76.9 kg    Labs: Lab Results  Component Value Date   NA 142 03/23/2024   K 3.9 03/23/2024   CL 108 03/23/2024   CO2 27 03/23/2024   GLUCOSE 130 (H) 03/23/2024   BUN 26 (H) 03/23/2024   CREATININE 1.37 (H) 03/23/2024   CALCIUM  9.3 03/23/2024   MG 2.1 07/26/2022   Lab Results  Component  Value Date   INR 1.2 02/10/2022   Lab Results  Component Value Date   CHOL 118 07/01/2023   HDL 45.80 07/01/2023   LDLCALC 51 07/01/2023   TRIG 108.0 07/01/2023   GEN- The patient is well appearing, alert and oriented x 3 today.   Neck - no JVD or carotid bruit noted Lungs- Clear to ausculation bilaterally, normal work of breathing Heart- Irregular tachycardic rate and rhythm, no murmurs, rubs or gallops, PMI not laterally displaced Extremities- no clubbing, cyanosis, or edema Skin - no rash or ecchymosis noted   EKG-  Vent. rate 115 BPM PR interval * ms QRS duration 90 ms QT/QTcB 342/473 ms P-R-T axes * -14 -1 Atrial fibrillation with rapid ventricular response with premature  ventricular or aberrantly conducted complexes Low voltage QRS Abnormal ECG When compared with ECG of 31-Jul-2023 10:41, Previous ECG is present   Echo 09/30/23:  1. Left ventricular ejection fraction, by estimation, is 55 to 60%. The  left ventricle has normal function. The left ventricle has no regional  wall motion abnormalities. Left ventricular diastolic function could not  be evaluated.   2. Right ventricular systolic function is normal. The right ventricular  size is normal.   3. The mitral valve is normal in structure. No evidence of mitral valve  regurgitation. No evidence of mitral stenosis.   4. The aortic valve is tricuspid. Aortic valve regurgitation is not  visualized. Aortic valve sclerosis is present, with no evidence of aortic  valve stenosis.   Comparison(s): A prior study was performed on 11/03/2021. No significant  change from prior study.   Assessment and Plan: 1. Atrial fibrillation - diagnosed May 2019  Patient remains in Afib but his HR is noticeably increased compared to previous. There is no specific etiology and he denies recent illness. Patient is currently on coreg  3.125 + 6.25 mg BID (total 9.375 mg BID). Will increase coreg  to 12.5 mg BID and have patient continue with BP/HR diary. He will contact office next week with update on HR.   2. CHA2DS2VASc  score of at least 5 Continue Eliquis  5 mg BID.   3. CAD No anginal symptoms.     Follow up Afib clinic 1 year.    Fairy Heinrich, PA-C Afib Clinic [facility] [address] [place], [place] [address] [phone]

## 2024-04-08 NOTE — Patient Instructions (Signed)
 Take carvedilol  12.5 mg twice a day monitor HR and BP call us  next week with update

## 2024-04-14 ENCOUNTER — Telehealth (HOSPITAL_COMMUNITY): Payer: Self-pay

## 2024-04-14 NOTE — Telephone Encounter (Signed)
 Patient called this morning with the following BP and HR 109/63 74, 127/79 80, 123/76 81, 110/63 75, 113/72 88, 137/77 71, 116/73 71, 132/80 72, 117/73 69, 124/66 74. Consulted with Thom he wants patient to continue current regimen.

## 2024-04-19 NOTE — Assessment & Plan Note (Addendum)
 Start maintenance today 8/29. Hold treatment due to local urinary symptoms. Follow up with cystoscopy in about 3 months with Dr. Brunetta. (Last was 8/13)

## 2024-04-19 NOTE — Progress Notes (Unsigned)
 [facility] OFFICE PROGRESS NOTE  Patient Care Team: [name], MD as PCP - General (Internal Medicine) [name], MD as PCP - Cardiology (Cardiology) [name], [name], [facility] (Inactive) (Pharmacist)  Dillon Young is a 89 y.o.male with history of prostate cancer with IMRT, NMIBC s/p BCG induction/maintenance, afib, CAD, chronic AC being seen at [facility] for NMIBC.   Addendum: Cystoscopy was normal from November.   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 BCG refractory Previous treatment: BCG Current treatment: 11/15/23 - 12/27/23 6 weekly induction intravesical gemcitabine  docetaxel  He continues to have incontinence and leak at baseline.    Previous hematuria, holding anticoagulation.  This is resolved after resuming anticoagulation.   Starting maintenance intravesical gemcitabine  docetaxel  on 8/29.   Completed 2 Cycles of maintenance. Treatment held for burning sensation in the bladder.  Ucx was negative. Previous cystoscopy showed area of dystrophic changes and mucosal erythema on the right anterior wall that is consistent with healing resected back, no obvious tumor.  This likely is slow to heal in addition to chemotherapy as well.  We will hold treatment.    Report he saw Dr. Watt for cystoscopy in November and next one in 3 months.  Patient will return about a week later after his cystoscopy to go over follow-up plan in Feb.  I will communicate with Dr. Watt.  Assessment & Plan Malignant neoplasm of urinary bladder, unspecified site [facility]) Start maintenance today 8/29. Hold treatment due to local urinary symptoms. Follow up with cystoscopy in about 3 months in Feb with Dr. Brunetta. (Last was on Nov 11th) See me about 3rd week of Feb  Orders Placed This Encounter  Procedures   Culture, Urine    Standing Status:   Future    Expiration Date:   04/20/2025   CBC with Differential (Cancer Center Only)    Standing Status:   Future    Expiration Date:   04/20/2025    CMP (Cancer Center only)    Standing Status:   Future    Expiration Date:   04/20/2025   Urinalysis, Complete w Microscopic    Standing Status:   Future    Expiration Date:   04/20/2025     [name], MD  INTERVAL HISTORY: Patient returns for follow-up. No burning pain on urination or in the bladder. Report his bladder is better. No gross hematuria. He has cysto in office was told follow up in about 3 months.  Oncology History  Bladder cancer (HCC)  05/31/2020 Pathology Results   A. PROSTATE AND PROSTATE URETHRA CHIPS, TURP:  High grade urothelial carcinoma  The carcinoma focally invades laminar propria  Muscularis propria (detrusor muscle) is present and not involves  Prostatic glands is not present    01/06/2021 Pathology Results   A. BLADDER, WALL, BIOPSY:  -  High-grade urothelial carcinoma, noninvasive  -  Detrusor muscle present but uninvolved by carcinoma   B. BLADDER, LEFT LATERAL WALL, BIOPSY:  -  High-grade urothelial carcinoma, noninvasive  -  No detrusor muscle identified    2022 - 2024 Chemotherapy   BCG Induction and maintenance.   01/10/2022 Initial Diagnosis   Bladder cancer (HCC)   09/20/2023 Pathology Results   BLADDER WALL, RIGHT LATERAL, BIOPSY:  High grade urothelial carcinoma  The carcinoma focally invades laminar propria  Muscularis propria (detrusor muscle) is not present    10/15/2023 Pathology Results   BLADDER TUMOR, TURBT:  Small fragments of high-grade urothelial carcinoma  Submucosa and muscularis propria with chronic  inflammation  Negative for invasive carcinoma    10/29/2023 Cancer Staging   Staging form: Urinary Bladder, AJCC 8th Edition - Clinical: Stage I (cT1, cN0, cM0) - Signed by Tina [name], MD on 10/29/2023 WHO/ISUP grade (low/high): High Grade Histologic grading system: 2 grade system   11/15/2023 -  Chemotherapy   Patient is on Treatment Plan : BLADDER Gemcitabine  (1000), Docetaxel  (37.5) INTRAVESICAL q7d         PHYSICAL EXAMINATION: ECOG PERFORMANCE STATUS: 1  Vitals:   04/20/24 0919  BP: 120/89  Pulse: 79  Resp: 18  Temp: (!) 97.2 F (36.2 C)  SpO2: 97%   Filed Weights   04/20/24 0919  Weight: 170 lb 3.2 oz (77.2 kg)    GENERAL: alert, no distress and comfortable SKIN: skin color normal and no jaundice   Relevant data reviewed during this visit included labs.  New labs ordered.

## 2024-04-20 ENCOUNTER — Inpatient Hospital Stay

## 2024-04-20 ENCOUNTER — Other Ambulatory Visit: Payer: Self-pay

## 2024-04-20 VITALS — BP 120/89 | HR 79 | Temp 97.2°F | Resp 18 | Wt 170.2 lb

## 2024-04-20 DIAGNOSIS — Z8551 Personal history of malignant neoplasm of bladder: Secondary | ICD-10-CM | POA: Diagnosis not present

## 2024-04-20 DIAGNOSIS — C679 Malignant neoplasm of bladder, unspecified: Secondary | ICD-10-CM | POA: Diagnosis not present

## 2024-04-20 DIAGNOSIS — R350 Frequency of micturition: Secondary | ICD-10-CM | POA: Insufficient documentation

## 2024-04-20 DIAGNOSIS — C678 Malignant neoplasm of overlapping sites of bladder: Secondary | ICD-10-CM

## 2024-04-20 DIAGNOSIS — N401 Enlarged prostate with lower urinary tract symptoms: Secondary | ICD-10-CM | POA: Insufficient documentation

## 2024-04-20 LAB — CMP (CANCER CENTER ONLY)
ALT: 7 U/L (ref 0–44)
AST: 20 U/L (ref 15–41)
Albumin: 4.2 g/dL (ref 3.5–5.0)
Alkaline Phosphatase: 52 U/L (ref 38–126)
Anion gap: 12 (ref 5–15)
BUN: 23 mg/dL (ref 8–23)
CO2: 24 mmol/L (ref 22–32)
Calcium: 9.3 mg/dL (ref 8.9–10.3)
Chloride: 106 mmol/L (ref 98–111)
Creatinine: 1.28 mg/dL — ABNORMAL HIGH (ref 0.61–1.24)
GFR, Estimated: 53 mL/min — ABNORMAL LOW (ref >60)
Glucose, Bld: 123 mg/dL — ABNORMAL HIGH (ref 70–99)
Potassium: 4.3 mmol/L (ref 3.5–5.1)
Sodium: 142 mmol/L (ref 135–145)
Total Bilirubin: 0.7 mg/dL (ref 0.0–1.2)
Total Protein: 7.2 g/dL (ref 6.5–8.1)

## 2024-04-20 LAB — CBC WITH DIFFERENTIAL (CANCER CENTER ONLY)
Abs Immature Granulocytes: 0.01 K/uL (ref 0.00–0.07)
Basophils Absolute: 0 K/uL (ref 0.0–0.1)
Basophils Relative: 0 %
Eosinophils Absolute: 0 K/uL (ref 0.0–0.5)
Eosinophils Relative: 1 %
HCT: 38.4 % — ABNORMAL LOW (ref 39.0–52.0)
Hemoglobin: 12.3 g/dL — ABNORMAL LOW (ref 13.0–17.0)
Immature Granulocytes: 0 %
Lymphocytes Relative: 18 %
Lymphs Abs: 0.9 K/uL (ref 0.7–4.0)
MCH: 26.2 pg (ref 26.0–34.0)
MCHC: 32 g/dL (ref 30.0–36.0)
MCV: 81.7 fL (ref 80.0–100.0)
Monocytes Absolute: 0.3 K/uL (ref 0.1–1.0)
Monocytes Relative: 7 %
Neutro Abs: 3.6 K/uL (ref 1.7–7.7)
Neutrophils Relative %: 74 %
Platelet Count: 149 K/uL — ABNORMAL LOW (ref 150–400)
RBC: 4.7 MIL/uL (ref 4.22–5.81)
RDW: 15.1 % (ref 11.5–15.5)
WBC Count: 4.9 K/uL (ref 4.0–10.5)
nRBC: 0 % (ref 0.0–0.2)

## 2024-04-22 ENCOUNTER — Inpatient Hospital Stay

## 2024-04-22 ENCOUNTER — Other Ambulatory Visit: Payer: Self-pay

## 2024-04-22 ENCOUNTER — Telehealth: Payer: Self-pay

## 2024-04-22 ENCOUNTER — Inpatient Hospital Stay: Admitting: Physician Assistant

## 2024-04-22 ENCOUNTER — Other Ambulatory Visit: Payer: Self-pay | Admitting: *Deleted

## 2024-04-22 VITALS — BP 102/65 | HR 82 | Temp 97.8°F | Resp 16 | Wt 173.2 lb

## 2024-04-22 DIAGNOSIS — R350 Frequency of micturition: Secondary | ICD-10-CM

## 2024-04-22 DIAGNOSIS — C678 Malignant neoplasm of overlapping sites of bladder: Secondary | ICD-10-CM

## 2024-04-22 DIAGNOSIS — Z8551 Personal history of malignant neoplasm of bladder: Secondary | ICD-10-CM | POA: Diagnosis not present

## 2024-04-22 DIAGNOSIS — N390 Urinary tract infection, site not specified: Secondary | ICD-10-CM

## 2024-04-22 LAB — URINALYSIS, COMPLETE (UACMP) WITH MICROSCOPIC
Bacteria, UA: NONE SEEN
Bilirubin Urine: NEGATIVE
Glucose, UA: NEGATIVE mg/dL
Ketones, ur: NEGATIVE mg/dL
Nitrite: NEGATIVE
Protein, ur: NEGATIVE mg/dL
Specific Gravity, Urine: 1.005 (ref 1.005–1.030)
pH: 6 (ref 5.0–8.0)

## 2024-04-22 MED ORDER — PHENAZOPYRIDINE HCL 100 MG PO TABS: 100.0000 mg | ORAL_TABLET | Freq: Three times a day (TID) | ORAL | 0 refills | Status: AC | PRN

## 2024-04-22 NOTE — Progress Notes (Signed)
 Symptom Management Consult Note [facility]    Patient Care Team: [name], MD as PCP - General (Internal Medicine) [name], MD as PCP - Cardiology (Cardiology) Rozella [name], [facility] (Inactive) (Pharmacist)    Name / MRN / DOBBETHA Brentley Young  [id number]  July 28, 1932   Date of visit: 04/22/2024   Chief Complaint/Reason for visit: urinary frequency   Current Therapy: Taxotere  and gemzar   Last treatment:  Day 1   Cycle 3 on 02/21/24    ASSESSMENT AND PLAN Patient is a 89 y.o. male with oncologic history of malignant neoplasm of urinary bladder followed by Dr. Tina.  I have viewed most recent oncology note and lab work.  #Malignant neoplasm of urinary bladder - Chart review shows last 2 treatments were held. - Next appointment with oncologist is 07/14/24   #Urinary frequency - Chart review shows urine culture from 03/23/24 was negative. Labs from office visit x 2 days ago showing creatinine consistent with baseline. - Patient is well appearing. Afebrile, HDS. No CVA tenderness.  - UA is not suggestive of infection. Will follow up on urine culture. - Will prescribe Pyridium  x 3 days for PRN use as patient had recent cystoscopy so potential for discomfort to be post procedural.     Strict ED precautions discussed should symptoms worsen.   HEME/ONC HISTORY Oncology History  Bladder cancer (HCC)  05/31/2020 Pathology Results   A. PROSTATE AND PROSTATE URETHRA CHIPS, TURP:  High grade urothelial carcinoma  The carcinoma focally invades laminar propria  Muscularis propria (detrusor muscle) is present and not involves  Prostatic glands is not present    01/06/2021 Pathology Results   A. BLADDER, WALL, BIOPSY:  -  High-grade urothelial carcinoma, noninvasive  -  Detrusor muscle present but uninvolved by carcinoma   B. BLADDER, LEFT LATERAL WALL, BIOPSY:  -  High-grade urothelial carcinoma, noninvasive  -  No detrusor muscle identified     2022 - 2024 Chemotherapy   BCG Induction and maintenance.   01/10/2022 Initial Diagnosis   Bladder cancer (HCC)   09/20/2023 Pathology Results   BLADDER WALL, RIGHT LATERAL, BIOPSY:  High grade urothelial carcinoma  The carcinoma focally invades laminar propria  Muscularis propria (detrusor muscle) is not present    10/15/2023 Pathology Results   BLADDER TUMOR, TURBT:  Small fragments of high-grade urothelial carcinoma  Submucosa and muscularis propria with chronic inflammation  Negative for invasive carcinoma    10/29/2023 Cancer Staging   Staging form: Urinary Bladder, AJCC 8th Edition - Clinical: Stage I (cT1, cN0, cM0) - Signed by Tina [name], MD on 10/29/2023 WHO/ISUP grade (low/high): High Grade Histologic grading system: 2 grade system   11/15/2023 -  Chemotherapy   Patient is on Treatment Plan : BLADDER Gemcitabine  (1000), Docetaxel  (37.5) INTRAVESICAL q7d         INTERVAL HISTORY  Discussed the use of AI scribe software for clinical note transcription with the patient, who gave verbal consent to proceed.    Dillon Young is a 89 y.o. male with oncologic history as above presenting to [facility] today with chief complaint of urinary frequency. Accompanied to clinic today by spouse who provides additional history.  . He began experiencing urinary symptoms last night, characterized by frequent urination with an urge to urinate at least six times, accompanied by a slight burning sensation. Despite the frequent urges, he was unable to urinate much each time. This frequency is unusual for him and resulted in  a lack of sleep. The symptoms persisted into the morning, prompting him to seek medical attention due to concerns about a possible infection. He thinks the frequency has decreased since waking up this morning.  No fever, chills, abdominal pain, nausea, or vomiting. No history of recent urinary tract infections, with the last possible occurrence being around September, for  which he took antibiotics he believes. No blood in urine and no back pain.  He underwent a cystoscopy about three weeks ago, which had a satisfactory outcome per patient, and he is scheduled to follow up with urology in 3 months.  He provided a urine sample today, describing it as yellow and slightly darker than usual, but without any noticeable odor. He has no known allergies to antibiotics and has a history of kidney stones, though he states that the current symptoms do not resemble those of kidney stones.   ROS  All other systems are reviewed and are negative for acute change except as noted in the HPI.    Allergies  Allergen Reactions   Lipitor [Atorvastatin ] Other (See Comments)    Muscle pain   Other Rash and Other (See Comments)    Detergent- Rashes on the skin     Past Medical History:  Diagnosis Date   Adenomatous colon polyp    Allergic rhinitis    Anemia    Bladder cancer (HCC)    currently on bcg treatments   BPH (benign prostatic hyperplasia)    CAD (coronary artery disease)    sees Dr. Dorn Lesches    Carotid artery disease    CHF (congestive heart failure) (HCC)    Chronic kidney disease    Sones. Prostate cancer   Clotting disorder    CVA (cerebral infarction)    Diverticulitis    Dysrhythmia    atrial fib - hx of    Edema    recent swelling in feet and ankles    Hemorrhoids    History of kidney stones    HTN (hypertension)    Hx of radiation therapy    for prostate - 2013    Hyperlipidemia LDL goal <70    IBS (irritable bowel syndrome)    Low back pain    Pre-diabetes    Prostate cancer (HCC) 2008   seed implantation   Sleep apnea    sitting in recliner helps him to breathe per pt , never used cpap sleep study - pt reports could not go to sleep- 6 years ago    Smoker    Stroke [facility])    hx of mini strokes years ago      Past Surgical History:  Procedure Laterality Date   APPENDECTOMY     CARDIAC CATHETERIZATION N/A 01/16/2016    Procedure: Left Heart Cath and Coronary Angiography;  Surgeon: Candyce GORMAN Reek, MD;  Location: [facility] INVASIVE CV LAB;  Service: Cardiovascular;  Laterality: N/A;   CARDIAC CATHETERIZATION N/A 01/16/2016   Procedure: Coronary Stent Intervention;  Surgeon: Candyce GORMAN Reek, MD;  Location: [facility] INVASIVE CV LAB;  Service: Cardiovascular;  Laterality: N/A;   CARDIOVERSION N/A 11/26/2017   Procedure: CARDIOVERSION;  Surgeon: Mona Vinie BROCKS, MD;  Location: [facility] ENDOSCOPY;  Service: Cardiovascular;  Laterality: N/A;   CARDIOVERSION N/A 03/13/2018   Procedure: CARDIOVERSION;  Surgeon: Mona Vinie BROCKS, MD;  Location: [facility] ENDOSCOPY;  Service: Cardiovascular;  Laterality: N/A;   CARDIOVERSION N/A 07/31/2022   Procedure: CARDIOVERSION;  Surgeon: Pietro Redell GORMAN, MD;  Location: [facility] ENDOSCOPY;  Service: Cardiovascular;  Laterality:  N/A;   CARDIOVERSION N/A 09/10/2022   Procedure: CARDIOVERSION;  Surgeon: Okey Vina GAILS, MD;  Location: [facility] INVASIVE CV LAB;  Service: Cardiovascular;  Laterality: N/A;   CATARACT EXTRACTION W/ INTRAOCULAR LENS  IMPLANT, BILATERAL Bilateral    COLONOSCOPY  10/21/2008   diverticulosis, radiation proctitis, internal hemorrhoids   CORONARY ANGIOPLASTY     CORONARY STENT INTERVENTION N/A 12/26/2017   Procedure: CORONARY STENT INTERVENTION;  Surgeon: [name], MD;  Location: [facility] INVASIVE CV LAB;  Service: Cardiovascular;  Laterality: N/A;   CYSTOSCOPY WITH BIOPSY N/A 09/10/2023   Procedure: CYSTOSCOPY, WITH BIOPSY/BILATERAL RETROGRADE;  Surgeon: Watt Rush, MD;  Location: [facility] ORS;  Service: Urology;  Laterality: N/A;  BILATERAL RETROGRADE   ENDARTERECTOMY  11/12/2011   Procedure: ENDARTERECTOMY CAROTID;  Surgeon: Krystal JULIANNA Doing, MD;  Location: [facility] OR;  Service: Vascular;  Laterality: Right;   ESOPHAGOGASTRODUODENOSCOPY (EGD) WITH ESOPHAGEAL DILATION     HEMORRHOID BANDING     LEFT HEART CATH AND CORONARY ANGIOGRAPHY N/A 12/26/2017   Procedure: LEFT HEART CATH AND CORONARY ANGIOGRAPHY;   Surgeon: [name], MD;  Location: [facility] INVASIVE CV LAB;  Service: Cardiovascular;  Laterality: N/A;   LITHOTRIPSY  X 3   LUMBAR LAMINECTOMY     TRANSURETHRAL RESECTION OF BLADDER TUMOR N/A 10/15/2023   Procedure: TURBT (TRANSURETHRAL RESECTION OF BLADDER TUMOR);  Surgeon: Watt Rush, MD;  Location: [facility] ORS;  Service: Urology;  Laterality: N/A;   TRANSURETHRAL RESECTION OF BLADDER TUMOR WITH MITOMYCIN -C Bilateral 01/06/2021   Procedure: TRANSURETHRAL RESECTION OF BLADDER TUMOR WITH GEMCITABINE  BILATERAL RETROGRADES;  Surgeon: Watt Rush, MD;  Location: [facility] ORS;  Service: Urology;  Laterality: Bilateral;   TRANSURETHRAL RESECTION OF PROSTATE  10/2003   TRANSURETHRAL RESECTION OF PROSTATE N/A 05/31/2020   Procedure: CYSTOSCOPY TRANSURETHRAL RESECTION BIOPSY AND FULGURATION OF PROSTATE LESION  (TURP);  Surgeon: Watt Rush, MD;  Location: [facility] ORS;  Service: Urology;  Laterality: N/A;    Social History   Socioeconomic History   Marital status: Married    Spouse name: Not on file   Number of children: 2   Years of education: Not on file   Highest education level: Not on file  Occupational History   Occupation:  research scientist (life sciences) [employer]   Occupation: Retired  Tobacco Use   Smoking status: Former    Current packs/day: 0.00    Average packs/day: 1.5 packs/day for 60.0 years (90.0 ttl pk-yrs)    Types: Cigarettes    Start date: 07/25/1948    Quit date: 07/25/2008    Years since quitting: 15.7   Smokeless tobacco: Never   Tobacco comments:    Former smoker 04/08/24  Vaping Use   Vaping status: Never Used  Substance and Sexual Activity   Alcohol  use: Never   Drug use: Never   Sexual activity: Not Currently  Other Topics Concern   Not on file  Social History Narrative   Married second time   Retired [employer] n41 yrs   2 daughters and 2 stepdaughters   Social Drivers of Corporate Investment Banker Strain: Low Risk  (10/19/2022)   Overall Financial Resource Strain  (CARDIA)    Difficulty of Paying Living Expenses: Not hard at all  Food Insecurity: No Food Insecurity (10/19/2022)   Hunger Vital Sign    Worried About Running Out of Food in the Last Year: Never true    Ran Out of Food in the Last Year: Never true  Transportation Needs: No Transportation Needs (10/19/2022)   PRAPARE -  Administrator, Civil Service (Medical): No    Lack of Transportation (Non-Medical): No  Physical Activity: Insufficiently Active (10/19/2022)   Exercise Vital Sign    Days of Exercise per Week: 4 days    Minutes of Exercise per Session: 30 min  Stress: No Stress Concern Present (10/19/2022)   [employer] of Occupational Health - Occupational Stress Questionnaire    Feeling of Stress : Only a little  Social Connections: Moderately Isolated (10/19/2022)   Social Connection and Isolation Panel    Frequency of Communication with Friends and Family: More than three times a week    Frequency of Social Gatherings with Friends and Family: More than three times a week    Attends Religious Services: Never    Database Administrator or Organizations: No    Attends Banker Meetings: Never    Marital Status: Married  Catering Manager Violence: Not At Risk (10/19/2022)   Humiliation, Afraid, Rape, and Kick questionnaire    Fear of Current or Ex-Partner: No    Emotionally Abused: No    Physically Abused: No    Sexually Abused: No    Family History  Problem Relation Age of Onset   Heart disease Father    Hypertension Mother    Asthma Paternal Grandmother    Colon cancer Neg Hx    Esophageal cancer Neg Hx    Stomach cancer Neg Hx    Rectal cancer Neg Hx    Liver cancer Neg Hx      Current Outpatient Medications:    acetaminophen  (TYLENOL ) 500 MG tablet, Take 500-1,000 mg by mouth daily as needed for mild pain or headache., Disp: , Rfl:    ascorbic acid  (VITAMIN C ) 500 MG tablet, Take 500 mg by mouth 2 (two) times a week., Disp: , Rfl:     carvedilol  (COREG ) 12.5 MG tablet, Take 1 tablet (12.5 mg total) by mouth 2 (two) times daily., Disp: 60 tablet, Rfl: 3   Cholecalciferol  (VITAMIN D -3) 25 MCG (1000 UT) CAPS, Take 1,000 Units by mouth daily., Disp: , Rfl:    cyanocobalamin  2000 MCG tablet, Take 1 tablet (2,000 mcg total) by mouth daily., Disp: 90 tablet, Rfl: 1   dicyclomine  (BENTYL ) 10 MG capsule, TAKE 1 CAPSULE (10 MG TOTAL) BY MOUTH EVERY 6 (SIX) HOURS AS NEEDED (ABDOMINAL PAIN)., Disp: 30 capsule, Rfl: 1   ELIQUIS  5 MG TABS tablet, TAKE 1 TABLET BY MOUTH TWICE A DAY, Disp: 180 tablet, Rfl: 1   fexofenadine (ALLEGRA) 180 MG tablet, Take 180 mg by mouth daily as needed for allergies or rhinitis. , Disp: , Rfl:    Fluocinolone  Acetonide 0.01 % OIL, 3 drops in affected ear(s) once or twice daily as needed for itching, Disp: , Rfl:    fluticasone  (FLONASE ) 50 MCG/ACT nasal spray, Place 2 sprays into both nostrils daily. (Patient taking differently: Place 2 sprays into both nostrils daily as needed for allergies.), Disp: 16 g, Rfl: 6   gabapentin  (NEURONTIN ) 300 MG capsule, TAKE 1 CAPSULE BY MOUTH THREE TIMES A DAY, Disp: 270 capsule, Rfl: 1   hydrALAZINE  (APRESOLINE ) 25 MG tablet, Take 25 mg by mouth 3 (three) times daily as needed (for bp over 160)., Disp: , Rfl:    hydrocortisone  (ANUSOL -HC) 2.5 % rectal cream, Place 1 application rectally 3 (three) times daily as needed for hemorrhoids., Disp: 30 g, Rfl: 1   Hypromellose (ARTIFICIAL TEARS OP), Place 1 drop into both eyes 4 (four) times daily as needed (  dry eyes)., Disp: , Rfl:    ipratropium (ATROVENT ) 0.06 % nasal spray, PLACE 2 SPRAYS INTO BOTH NOSTRILS 2 (TWO) TIMES DAILY AS NEEDED FOR RHINITIS., Disp: 135 mL, Rfl: 1   levocetirizine (XYZAL ) 5 MG tablet, Take 1 tablet (5 mg total) by mouth every evening., Disp: 90 tablet, Rfl: 1   melatonin 5 MG TABS, Take 5 mg by mouth at bedtime as needed (sleep)., Disp: , Rfl:    nitroGLYCERIN  (NITROSTAT ) 0.4 MG SL tablet, Place 1 tablet (0.4  mg total) under the tongue every 5 (five) minutes as needed for chest pain., Disp: 25 tablet, Rfl: 3   ondansetron  (ZOFRAN ) 8 MG tablet, Take 1 tablet (8 mg total) by mouth every 8 (eight) hours as needed for nausea or vomiting., Disp: 30 tablet, Rfl: 1   phenazopyridine  (PYRIDIUM ) 100 MG tablet, Take 1 tablet (100 mg total) by mouth 3 (three) times daily as needed for up to 3 days for pain. Only if you have pain with urination., Disp: 9 tablet, Rfl: 0   prochlorperazine  (COMPAZINE ) 10 MG tablet, Take 1 tablet (10 mg total) by mouth every 6 (six) hours as needed for nausea or vomiting., Disp: 30 tablet, Rfl: 1   rosuvastatin  (CRESTOR ) 10 MG tablet, TAKE 1 TABLET BY MOUTH EVERY DAY, Disp: 90 tablet, Rfl: 1   sacubitril -valsartan  (ENTRESTO ) 49-51 MG, Take 1 tablet by mouth 2 (two) times daily., Disp: 60 tablet, Rfl: 5   sodium bicarbonate  650 MG tablet, Take 1300 mg (two 650 mg tabs) the night before and morning of therapy., Disp: 24 tablet, Rfl: 1   sodium chloride  (OCEAN) 0.65 % SOLN nasal spray, Place 1 spray into both nostrils as needed for congestion., Disp: , Rfl:    SPIRIVA  RESPIMAT 2.5 MCG/ACT AERS, Inhale 2 puffs into the lungs daily as needed (shortness of breath)., Disp: , Rfl:    spironolactone  (ALDACTONE ) 25 MG tablet, Take 1 tablet (25 mg total) by mouth daily., Disp: 90 tablet, Rfl: 1   triamcinolone cream (KENALOG) 0.1 %, Apply 1 application topically daily as needed (to affected areas, for itching). , Disp: , Rfl:   PHYSICAL EXAM ECOG FS:1 - Symptomatic but completely ambulatory    Vitals:   04/22/24 1027  BP: 102/65  Pulse: 82  Resp: 16  Temp: 97.8 F (36.6 C)  TempSrc: Temporal  SpO2: 98%  Weight: 173 lb 3.2 oz (78.6 kg)   Physical Exam Vitals and nursing note reviewed.  Constitutional:      Appearance: He is not ill-appearing or toxic-appearing.  HENT:     Head: Normocephalic.     Mouth/Throat:     Mouth: Mucous membranes are moist.  Eyes:     Conjunctiva/sclera:  Conjunctivae normal.  Cardiovascular:     Rate and Rhythm: Normal rate.     Pulses: Normal pulses.  Pulmonary:     Effort: Pulmonary effort is normal.  Abdominal:     General: There is no distension.     Palpations: Abdomen is soft.     Tenderness: There is no abdominal tenderness. There is no right CVA tenderness, left CVA tenderness or guarding.  Musculoskeletal:     Cervical back: Normal range of motion.  Skin:    General: Skin is warm and dry.  Neurological:     Mental Status: He is alert.        LABORATORY DATA I have reviewed the data as listed    Latest Ref Rng & Units 04/20/2024    9:00 AM 03/23/2024  8:20 AM 02/21/2024    9:23 AM  CBC  WBC 4.0 - 10.5 K/uL 4.9  5.1  4.2   Hemoglobin 13.0 - 17.0 g/dL 87.6  87.6  86.8   Hematocrit 39.0 - 52.0 % 38.4  38.3  41.2   Platelets 150 - 400 K/uL 149  153  141         Latest Ref Rng & Units 04/20/2024    9:00 AM 03/23/2024    8:20 AM 02/21/2024    9:23 AM  CMP  Glucose 70 - 99 mg/dL 876  869  861   BUN 8 - 23 mg/dL 23  26  21    Creatinine 0.61 - 1.24 mg/dL 8.71  8.62  8.78   Sodium 135 - 145 mmol/L 142  142  142   Potassium 3.5 - 5.1 mmol/L 4.3  3.9  4.7   Chloride 98 - 111 mmol/L 106  108  105   CO2 22 - 32 mmol/L 24  27  32   Calcium  8.9 - 10.3 mg/dL 9.3  9.3  9.7   Total Protein 6.5 - 8.1 g/dL 7.2  7.1  7.3   Total Bilirubin 0.0 - 1.2 mg/dL 0.7  0.7  0.9   Alkaline Phos 38 - 126 U/L 52  46  52   AST 15 - 41 U/L 20  13  14    ALT 0 - 44 U/L 7  <5  8        RADIOGRAPHIC STUDIES (from last 24 hours if applicable) I have personally reviewed the radiological images as listed and agreed with the findings in the report. No results found.      Visit Diagnosis: 1. Malignant neoplasm of overlapping sites of bladder (HCC)   2. Urinary frequency      No orders of the defined types were placed in this encounter.   All questions were answered. The patient knows to call the clinic with any problems,  questions or concerns. No barriers to learning was detected.  A total of more than 30 minutes were spent on this encounter with face-to-face time and non-face-to-face time, including preparing to see the patient, ordering tests and/or medications, counseling the patient and coordination of care as outlined above.    Thank you for allowing me to participate in the care of this patient.    Aleanna Menge E  Walisiewicz, PA-C Department of Hematology/Oncology [facility] Phone: [phone]  [phone]    04/22/2024 12:37 PM

## 2024-04-22 NOTE — Telephone Encounter (Signed)
 Spoke with patient regarding new onset urinary symptoms. Patient reports increased urinary frequency and urgency beginning yesterday evening, along with a sensation of incomplete bladder emptying. No hematuria noted, however does note that urine has a foul odor. Patient is currently afebrile. Offered an in-clinic evaluation, and patient agreed to come in for labs and assessment at the [facility] clinic today at 10:00 AM. Appointment has been scheduled and confirmed.

## 2024-04-23 ENCOUNTER — Other Ambulatory Visit: Payer: Self-pay

## 2024-04-23 LAB — URINE CULTURE: Culture: NO GROWTH

## 2024-05-11 ENCOUNTER — Ambulatory Visit: Admitting: Family Medicine

## 2024-05-11 ENCOUNTER — Ambulatory Visit: Payer: Self-pay

## 2024-05-11 NOTE — Telephone Encounter (Signed)
 FYI Only or Action Required?: FYI only for provider: appointment scheduled on 12.15.25.  Patient was last seen in primary care on 01/01/2024 by [name], MD.  Called Nurse Triage reporting Cough.  Symptoms began several weeks ago.  Interventions attempted: OTC medications: zyrtec, cough medicine.  Symptoms are: gradually worsening.  Triage Disposition: See PCP When Office is Open (Within 3 Days)  Patient/caregiver understands and will follow disposition?: Yes   Copied from CRM #[id number]. Topic: Clinical - Red Word Triage >> May 11, 2024  9:04 AM Avram MATSU wrote: Red Word that prompted transfer to Nurse Triage: sore throat, chest congestion, no fever Reason for Disposition  Cough has been present for > 3 weeks  Answer Assessment - Initial Assessment Questions Pt states he has had this cough for the past few weeks but felt like it got worse yesterday. States it is more in his chest, states he has a burning in his upper chest just below his throat, denies any chest pain down into his chest or sternum. Denies any shortness of breath or fever. Just changes to his voice, no slurred speech.   1. ONSET: When did the cough begin?       A couple of weeks, worsened yesterday 2. SEVERITY: How bad is the cough today?      States worse yesterday 3. SPUTUM: Describe the color of your sputum (e.g., none, dry cough; clear, white, yellow, green)     clear 4. HEMOPTYSIS: Are you coughing up any blood? If Yes, ask: How much? (e.g., flecks, streaks, tablespoons, etc.)     denies 5. DIFFICULTY BREATHING: Are you having difficulty breathing? If Yes, ask: How bad is it? (e.g., mild, moderate, severe)      denies 6. FEVER: Do you have a fever? If Yes, ask: What is your temperature, how was it measured, and when did it start?     denies 7. CARDIAC HISTORY: Do you have any history of heart disease? (e.g., heart attack, congestive heart failure)      Htn, cad, afib 8. LUNG  HISTORY: Do you have any history of lung disease?  (e.g., pulmonary embolus, asthma, emphysema)     emphysema 9. OTHER SYMPTOMS: Do you have any other symptoms? (e.g., runny nose, wheezing, chest pain)       Denies wheezing or chest pain  Protocols used: Cough - Acute Productive-A-AH

## 2024-05-11 NOTE — Progress Notes (Deleted)
° °  Acute Office Visit  Subjective:     Patient ID: Dillon Young, male    DOB: 1932-08-01, 89 y.o.   MRN: [id number]  No chief complaint on file.   HPI  Discussed the use of AI scribe software for clinical note transcription with the patient, who gave verbal consent to proceed.  History of Present Illness      ROS Per HPI      Objective:    There were no vitals taken for this visit.   Physical Exam Vitals and nursing note reviewed.  Constitutional:      General: He is not in acute distress.    Appearance: Normal appearance.  HENT:     Head: Normocephalic and atraumatic.     Right Ear: External ear normal.     Left Ear: External ear normal.     Nose: Nose normal.     Mouth/Throat:     Mouth: Mucous membranes are moist.     Pharynx: Oropharynx is clear.  Eyes:     Extraocular Movements: Extraocular movements intact.  Cardiovascular:     Rate and Rhythm: Normal rate and regular rhythm.     Pulses: Normal pulses.     Heart sounds: Normal heart sounds.  Pulmonary:     Effort: Pulmonary effort is normal. No respiratory distress.     Breath sounds: Normal breath sounds. No wheezing, rhonchi or rales.  Musculoskeletal:        General: Normal range of motion.     Cervical back: Normal range of motion.     Right lower leg: No edema.     Left lower leg: No edema.  Lymphadenopathy:     Cervical: No cervical adenopathy.  Skin:    General: Skin is warm and dry.  Neurological:     General: No focal deficit present.     Mental Status: He is alert and oriented to person, place, and time.  Psychiatric:        Mood and Affect: Mood normal.        Behavior: Behavior normal.     No results found for any visits on 05/11/24.      Assessment & Plan:   Assessment and Plan Assessment & Plan      No orders of the defined types were placed in this encounter.    No orders of the defined types were placed in this encounter.   No follow-ups on  file.  Corean LITTIE Ku, FNP

## 2024-05-12 ENCOUNTER — Encounter: Payer: Self-pay | Admitting: Emergency Medicine

## 2024-05-12 ENCOUNTER — Ambulatory Visit: Admitting: Emergency Medicine

## 2024-05-12 VITALS — BP 138/80 | HR 59 | Temp 97.7°F | Ht 68.0 in | Wt 172.0 lb

## 2024-05-12 DIAGNOSIS — J22 Unspecified acute lower respiratory infection: Secondary | ICD-10-CM | POA: Diagnosis not present

## 2024-05-12 DIAGNOSIS — R053 Chronic cough: Secondary | ICD-10-CM

## 2024-05-12 DIAGNOSIS — R0989 Other specified symptoms and signs involving the circulatory and respiratory systems: Secondary | ICD-10-CM | POA: Diagnosis not present

## 2024-05-12 MED ORDER — AZITHROMYCIN 250 MG PO TABS: ORAL_TABLET | ORAL | 0 refills | Status: AC

## 2024-05-12 NOTE — Patient Instructions (Signed)
 Acute Bronchitis, Adult  Acute bronchitis is when air tubes in the lungs (bronchi) suddenly get swollen. The condition can make it hard for you to breathe. In adults, acute bronchitis usually goes away within 2 weeks. A cough caused by bronchitis may last up to 3 weeks. Smoking, allergies, and asthma can make the condition worse. What are the causes? Germs that cause cold and flu (viruses). The most common cause of this condition is the virus that causes the common cold. Bacteria. Substances that bother (irritate) the lungs, including: Smoke from cigarettes and other types of tobacco. Dust and pollen. Fumes from chemicals, gases, or burned fuel. Indoor or outdoor air pollution. What increases the risk? A weak body's defense system. This is also called the immune system. Any condition that affects your lungs and breathing, such as asthma. What are the signs or symptoms? A cough. Coughing up clear, yellow, or green mucus. Making high-pitched whistling sounds when you breathe, most often when you breathe out (wheezing). Runny or stuffy nose. Having too much mucus in your lungs (chest congestion). Shortness of breath. Body aches. A sore throat. How is this treated? Acute bronchitis may go away over time without treatment. Your doctor may tell you to: Drink more fluids. This will help thin your mucus so it is easier to cough up. Use a device that gets medicine into your lungs (inhaler). Use a vaporizer or a humidifier. These are machines that add water to the air. This helps with coughing and poor breathing. Take a medicine that thins mucus and helps clear it from your lungs. Take a medicine that prevents or stops coughing. It is not common to take an antibiotic medicine for this condition. Follow these instructions at home:  Take over-the-counter and prescription medicines only as told by your doctor. Use an inhaler, vaporizer, or humidifier as told by your doctor. Take two teaspoons  (10 mL) of honey at bedtime. This helps lessen your coughing at night. Drink enough fluid to keep your pee (urine) pale yellow. Do not smoke or use any products that contain nicotine or tobacco. If you need help quitting, ask your doctor. Get a lot of rest. Return to your normal activities when your doctor says that it is safe. Keep all follow-up visits. How is this prevented?  Wash your hands often with soap and water for at least 20 seconds. If you cannot use soap and water, use hand sanitizer. Avoid contact with people who have cold symptoms. Try not to touch your mouth, nose, or eyes with your hands. Avoid breathing in smoke or chemical fumes. Make sure to get the flu shot every year. Contact a doctor if: Your symptoms do not get better in 2 weeks. You have trouble coughing up the mucus. Your cough keeps you awake at night. You have a fever. Get help right away if: You cough up blood. You have chest pain. You have very bad shortness of breath. You faint or keep feeling like you are going to faint. You have a very bad headache. Your fever or chills get worse. These symptoms may be an emergency. Get help right away. Call your local emergency services (911 in the [place] Do not wait to see if the symptoms will go away. Do not drive yourself to the hospital. Summary Acute bronchitis is when air tubes in the lungs (bronchi) suddenly get swollen. In adults, acute bronchitis usually goes away within 2 weeks. Drink more fluids. This will help thin your mucus so it is easier  to cough up. Take over-the-counter and prescription medicines only as told by your doctor. Contact a doctor if your symptoms do not improve after 2 weeks of treatment. This information is not intended to replace advice given to you by your health care provider. Make sure you discuss any questions you have with your health care provider. Document Revised: 09/14/2020 Document Reviewed: 09/14/2020 Elsevier Patient  Education  2024 [employer].

## 2024-05-12 NOTE — Progress Notes (Signed)
 Dillon Young 89 y.o.   Chief Complaint  Patient presents with   Cough    Pt states that his cough has been on and off and its very dry and sometimes he has a burning sensation in his chest but states the he feels alittle bit better today.  Pt also states that his throat still hurts and has been for several weeks     HISTORY OF PRESENT ILLNESS: Acute problem visit today. This is a 89 y.o. male complaining of flulike symptoms that started about 2 weeks ago.  Slowly getting better Still dealing with some cough and hoarseness with chest congestion Has been taking Mucinex , Delsym, and Allegra with some relief No other associated symptoms No other complaints or medical concerns today.  Cough Pertinent negatives include no chest pain, chills, fever, headaches or rash.     Prior to Admission medications  Medication Sig Start Date End Date Taking? Authorizing Provider  acetaminophen  (TYLENOL ) 500 MG tablet Take 500-1,000 mg by mouth daily as needed for mild pain or headache.   Yes [provider]  ascorbic acid  (VITAMIN C ) 500 MG tablet Take 500 mg by mouth 2 (two) times a week.   Yes [provider]  carvedilol  (COREG ) 12.5 MG tablet Take 1 tablet (12.5 mg total) by mouth 2 (two) times daily. 04/08/24  Yes Terra Fairy PARAS, PA-C  Cholecalciferol  (VITAMIN D -3) 25 MCG (1000 UT) CAPS Take 1,000 Units by mouth daily.   Yes [provider]  cyanocobalamin  2000 MCG tablet Take 1 tablet (2,000 mcg total) by mouth daily. 01/01/24  Yes [name], MD  dicyclomine  (BENTYL ) 10 MG capsule TAKE 1 CAPSULE (10 MG TOTAL) BY MOUTH EVERY 6 (SIX) HOURS AS NEEDED (ABDOMINAL PAIN). 03/16/22  Yes Esterwood, Amy S, PA-C  ELIQUIS  5 MG TABS tablet TAKE 1 TABLET BY MOUTH TWICE A DAY 12/26/23  Yes [name], MD  fexofenadine (ALLEGRA) 180 MG tablet Take 180 mg by mouth daily as needed for allergies or rhinitis.    Yes [provider]  Fluocinolone  Acetonide 0.01 % OIL  3 drops in affected ear(s) once or twice daily as needed for itching 07/09/23  Yes [provider]  fluticasone  (FLONASE ) 50 MCG/ACT nasal spray Place 2 sprays into both nostrils daily. Patient taking differently: Place 2 sprays into both nostrils daily as needed for allergies. 12/20/22  Yes Kara Dorn NOVAK, MD  gabapentin  (NEURONTIN ) 300 MG capsule TAKE 1 CAPSULE BY MOUTH THREE TIMES A DAY 03/31/24  Yes [name], MD  hydrALAZINE  (APRESOLINE ) 25 MG tablet Take 25 mg by mouth 3 (three) times daily as needed (for bp over 160).   Yes [provider]  hydrocortisone  (ANUSOL -HC) 2.5 % rectal cream Place 1 application rectally 3 (three) times daily as needed for hemorrhoids. 02/21/21  Yes Avram Lupita BRAVO, MD  Hypromellose (ARTIFICIAL TEARS OP) Place 1 drop into both eyes 4 (four) times daily as needed (dry eyes).   Yes [provider]  ipratropium (ATROVENT ) 0.06 % nasal spray PLACE 2 SPRAYS INTO BOTH NOSTRILS 2 (TWO) TIMES DAILY AS NEEDED FOR RHINITIS. 02/04/24  Yes [name], MD  levocetirizine (XYZAL ) 5 MG tablet Take 1 tablet (5 mg total) by mouth every evening. 03/25/24  Yes [name], MD  melatonin 5 MG TABS Take 5 mg by mouth at bedtime as needed (sleep).   Yes [provider]  nitroGLYCERIN  (NITROSTAT ) 0.4 MG SL tablet Place 1 tablet (0.4 mg total) under the  tongue every 5 (five) minutes as needed for chest pain. 01/01/24  Yes [name], MD  ondansetron  (ZOFRAN ) 8 MG tablet Take 1 tablet (8 mg total) by mouth every 8 (eight) hours as needed for nausea or vomiting. 10/30/23  Yes Tina [name], MD  prochlorperazine  (COMPAZINE ) 10 MG tablet Take 1 tablet (10 mg total) by mouth every 6 (six) hours as needed for nausea or vomiting. 10/30/23  Yes Tina [name], MD  rosuvastatin  (CRESTOR ) 10 MG tablet TAKE 1 TABLET BY MOUTH EVERY DAY 03/03/24  Yes [name], MD  sacubitril -valsartan  (ENTRESTO ) 49-51 MG Take 1 tablet by mouth 2 (two) times daily.  03/09/24  Yes [name], MD  sodium bicarbonate  650 MG tablet Take 1300 mg (two 650 mg tabs) the night before and morning of therapy. 12/19/23  Yes Tina [name], MD  sodium chloride  (OCEAN) 0.65 % SOLN nasal spray Place 1 spray into both nostrils as needed for congestion.   Yes [provider]  SPIRIVA  RESPIMAT 2.5 MCG/ACT AERS Inhale 2 puffs into the lungs daily as needed (shortness of breath). 10/04/23  Yes [provider]  spironolactone  (ALDACTONE ) 25 MG tablet Take 1 tablet (25 mg total) by mouth daily. 03/11/24  Yes [name], MD  triamcinolone cream (KENALOG) 0.1 % Apply 1 application topically daily as needed (to affected areas, for itching).  01/06/19  Yes [provider]    Allergies[1]  Patient Active Problem List   Diagnosis Date Noted   Complicated UTI (urinary tract infection) 03/23/2024   Chronic anticoagulation 01/24/2024   Subacute cough 01/01/2024   Permanent atrial fibrillation (HCC) 04/11/2023   Cervico-occipital neuralgia 03/01/2023   Hypercoagulable state due to permanent atrial fibrillation (HCC) 09/12/2022   Diabetic neuropathy, painful (HCC) 06/27/2022   Stage 3b chronic kidney disease (HCC) 06/27/2022   Bladder cancer (HCC) 01/10/2022   Left renal stone 08/24/2021   Panlobular emphysema (HCC) 06/08/2021   Laryngitis, chronic 04/18/2021   Encounter for general adult medical examination with abnormal findings 03/10/2021   Vitamin B12 deficiency anemia due to intrinsic factor deficiency 03/08/2021   Iron deficiency anemia secondary to inadequate dietary iron intake 03/08/2021   Thrombocytopenia 03/07/2021   Intrinsic eczema 01/12/2019   CAD (coronary artery disease) 12/26/2017   Left ventricular dysfunction 11/06/2017   Persistent atrial fibrillation 10/02/2017   Aortic atherosclerosis 05/02/2017   Primary osteoarthritis of both knees 03/13/2016   Bradycardia 12/15/2015   Middle insomnia 08/15/2015   Carotid artery  disease- s/p RCE June 2013 11/01/2011   Radiation proctitis 03/30/2011   Constipation 02/02/2011   Cerebrovascular disease 09/08/2008   Allergic rhinitis 09/08/2008   Diabetes mellitus without complication (HCC) 09/08/2008   Prostate cancer (HCC) 09/06/2008    Class: History of   Hyperlipidemia with target LDL less than 70 07/02/2008   Essential hypertension 07/02/2008   GERD 07/02/2008   Irritable bowel syndrome 07/02/2008   LOW BACK PAIN SYNDROME 07/02/2008    Past Medical History:  Diagnosis Date   Adenomatous colon polyp    Allergic rhinitis    Anemia    Bladder cancer (HCC)    currently on bcg treatments   BPH (benign prostatic hyperplasia)    CAD (coronary artery disease)    sees Dr. Dorn Court    Carotid artery disease    CHF (congestive heart failure) (HCC)    Chronic kidney disease    Sones. Prostate cancer   Clotting disorder    CVA (cerebral infarction)  Diverticulitis    Dysrhythmia    atrial fib - hx of    Edema    recent swelling in feet and ankles    Hemorrhoids    History of kidney stones    HTN (hypertension)    Hx of radiation therapy    for prostate - 2013    Hyperlipidemia LDL goal <70    IBS (irritable bowel syndrome)    Low back pain    Pre-diabetes    Prostate cancer (HCC) 2008   seed implantation   Sleep apnea    sitting in recliner helps him to breathe per pt , never used cpap sleep study - pt reports could not go to sleep- 6 years ago    Smoker    Stroke [facility])    hx of mini strokes years ago     Past Surgical History:  Procedure Laterality Date   APPENDECTOMY     CARDIAC CATHETERIZATION N/A 01/16/2016   Procedure: Left Heart Cath and Coronary Angiography;  Surgeon: Candyce GORMAN Reek, MD;  Location: [facility] INVASIVE CV LAB;  Service: Cardiovascular;  Laterality: N/A;   CARDIAC CATHETERIZATION N/A 01/16/2016   Procedure: Coronary Stent Intervention;  Surgeon: Candyce GORMAN Reek, MD;  Location: [facility] INVASIVE CV LAB;  Service:  Cardiovascular;  Laterality: N/A;   CARDIOVERSION N/A 11/26/2017   Procedure: CARDIOVERSION;  Surgeon: Mona Vinie BROCKS, MD;  Location: [facility] ENDOSCOPY;  Service: Cardiovascular;  Laterality: N/A;   CARDIOVERSION N/A 03/13/2018   Procedure: CARDIOVERSION;  Surgeon: Mona Vinie BROCKS, MD;  Location: [facility] ENDOSCOPY;  Service: Cardiovascular;  Laterality: N/A;   CARDIOVERSION N/A 07/31/2022   Procedure: CARDIOVERSION;  Surgeon: Pietro Redell GORMAN, MD;  Location: [facility] ENDOSCOPY;  Service: Cardiovascular;  Laterality: N/A;   CARDIOVERSION N/A 09/10/2022   Procedure: CARDIOVERSION;  Surgeon: Okey Vina GAILS, MD;  Location: [facility] INVASIVE CV LAB;  Service: Cardiovascular;  Laterality: N/A;   CATARACT EXTRACTION W/ INTRAOCULAR LENS  IMPLANT, BILATERAL Bilateral    COLONOSCOPY  10/21/2008   diverticulosis, radiation proctitis, internal hemorrhoids   CORONARY ANGIOPLASTY     CORONARY STENT INTERVENTION N/A 12/26/2017   Procedure: CORONARY STENT INTERVENTION;  Surgeon: [name], MD;  Location: [facility] INVASIVE CV LAB;  Service: Cardiovascular;  Laterality: N/A;   CYSTOSCOPY WITH BIOPSY N/A 09/10/2023   Procedure: CYSTOSCOPY, WITH BIOPSY/BILATERAL RETROGRADE;  Surgeon: Watt Rush, MD;  Location: [facility] ORS;  Service: Urology;  Laterality: N/A;  BILATERAL RETROGRADE   ENDARTERECTOMY  11/12/2011   Procedure: ENDARTERECTOMY CAROTID;  Surgeon: Krystal JULIANNA Doing, MD;  Location: [facility] OR;  Service: Vascular;  Laterality: Right;   ESOPHAGOGASTRODUODENOSCOPY (EGD) WITH ESOPHAGEAL DILATION     HEMORRHOID BANDING     LEFT HEART CATH AND CORONARY ANGIOGRAPHY N/A 12/26/2017   Procedure: LEFT HEART CATH AND CORONARY ANGIOGRAPHY;  Surgeon: [name], MD;  Location: [facility] INVASIVE CV LAB;  Service: Cardiovascular;  Laterality: N/A;   LITHOTRIPSY  X 3   LUMBAR LAMINECTOMY     TRANSURETHRAL RESECTION OF BLADDER TUMOR N/A 10/15/2023   Procedure: TURBT (TRANSURETHRAL RESECTION OF BLADDER TUMOR);  Surgeon: Watt Rush, MD;  Location: [facility] ORS;   Service: Urology;  Laterality: N/A;   TRANSURETHRAL RESECTION OF BLADDER TUMOR WITH MITOMYCIN -C Bilateral 01/06/2021   Procedure: TRANSURETHRAL RESECTION OF BLADDER TUMOR WITH GEMCITABINE  BILATERAL RETROGRADES;  Surgeon: Watt Rush, MD;  Location: [facility] ORS;  Service: Urology;  Laterality: Bilateral;   TRANSURETHRAL RESECTION OF PROSTATE  10/2003   TRANSURETHRAL RESECTION OF PROSTATE N/A 05/31/2020   Procedure: CYSTOSCOPY TRANSURETHRAL  RESECTION BIOPSY AND FULGURATION OF PROSTATE LESION  (TURP);  Surgeon: Watt Rush, MD;  Location: [facility] ORS;  Service: Urology;  Laterality: N/A;    Social History   Socioeconomic History   Marital status: Married    Spouse name: Not on file   Number of children: 2   Years of education: Not on file   Highest education level: Not on file  Occupational History   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) [employer]   Occupation: Retired  Tobacco Use   Smoking status: Former    Current packs/day: 0.00    Average packs/day: 1.5 packs/day for 60.0 years (90.0 ttl pk-yrs)    Types: Cigarettes    Start date: 07/25/1948    Quit date: 07/25/2008    Years since quitting: 15.8   Smokeless tobacco: Never   Tobacco comments:    Former smoker 04/08/24  Vaping Use   Vaping status: Never Used  Substance and Sexual Activity   Alcohol  use: Never   Drug use: Never   Sexual activity: Not Currently  Other Topics Concern   Not on file  Social History Narrative   Married second time   Retired [employer] n41 yrs   2 daughters and 2 stepdaughters   Social Drivers of Health   Tobacco Use: Medium Risk (05/12/2024)   Patient History    Smoking Tobacco Use: Former    Smokeless Tobacco Use: Never    Passive Exposure: Not on Actuary Strain: Low Risk (10/19/2022)   Overall Financial Resource Strain (CARDIA)    Difficulty of Paying Living Expenses: Not hard at all  Food Insecurity: No Food Insecurity (10/19/2022)   Hunger Vital Sign    Worried About Running Out  of Food in the Last Year: Never true    Ran Out of Food in the Last Year: Never true  Transportation Needs: No Transportation Needs (10/19/2022)   PRAPARE - Administrator, Civil Service (Medical): No    Lack of Transportation (Non-Medical): No  Physical Activity: Insufficiently Active (10/19/2022)   Exercise Vital Sign    Days of Exercise per Week: 4 days    Minutes of Exercise per Session: 30 min  Stress: No Stress Concern Present (10/19/2022)   [employer] of Occupational Health - Occupational Stress Questionnaire    Feeling of Stress : Only a little  Social Connections: Moderately Isolated (10/19/2022)   Social Connection and Isolation Panel    Frequency of Communication with Friends and Family: More than three times a week    Frequency of Social Gatherings with Friends and Family: More than three times a week    Attends Religious Services: Never    Database Administrator or Organizations: No    Attends Banker Meetings: Never    Marital Status: Married  Catering Manager Violence: Not At Risk (10/19/2022)   Humiliation, Afraid, Rape, and Kick questionnaire    Fear of Current or Ex-Partner: No    Emotionally Abused: No    Physically Abused: No    Sexually Abused: No  Depression (PHQ2-9): Low Risk (05/12/2024)   Depression (PHQ2-9)    PHQ-2 Score: 0  Alcohol  Screen: Low Risk (10/19/2022)   Alcohol  Screen    Last Alcohol  Screening Score (AUDIT): 0  Housing: Low Risk (10/19/2022)   Housing    Last Housing Risk Score: 0  Utilities: Not At Risk (10/19/2022)   AHC Utilities    Threatened with loss of utilities: No  Health Literacy: Not on  file    Family History  Problem Relation Age of Onset   Heart disease Father    Hypertension Mother    Asthma Paternal Grandmother    Colon cancer Neg Hx    Esophageal cancer Neg Hx    Stomach cancer Neg Hx    Rectal cancer Neg Hx    Liver cancer Neg Hx      Review of Systems  Constitutional: Negative.   Negative for chills and fever.  HENT:  Positive for congestion.   Respiratory:  Positive for cough and sputum production.   Cardiovascular: Negative.  Negative for chest pain and palpitations.  Gastrointestinal:  Negative for abdominal pain, diarrhea, nausea and vomiting.  Genitourinary: Negative.  Negative for dysuria and hematuria.  Skin: Negative.  Negative for rash.  Neurological: Negative.  Negative for dizziness and headaches.  All other systems reviewed and are negative.   Vitals:   05/12/24 0947  BP: 138/80  Pulse: (!) 59  Temp: 97.7 F (36.5 C)  SpO2: 99%    Physical Exam Vitals reviewed.  Constitutional:      Appearance: Normal appearance.  HENT:     Head: Normocephalic.     Mouth/Throat:     Mouth: Mucous membranes are moist.     Pharynx: Oropharynx is clear.  Eyes:     Extraocular Movements: Extraocular movements intact.     Pupils: Pupils are equal, round, and reactive to light.  Cardiovascular:     Rate and Rhythm: Normal rate and regular rhythm.     Pulses: Normal pulses.     Heart sounds: Normal heart sounds.  Pulmonary:     Effort: Pulmonary effort is normal.     Breath sounds: Normal breath sounds.  Abdominal:     Palpations: Abdomen is soft.     Tenderness: There is no abdominal tenderness.  Musculoskeletal:     Cervical back: No tenderness.  Lymphadenopathy:     Cervical: No cervical adenopathy.  Skin:    General: Skin is warm and dry.  Neurological:     Mental Status: He is alert and oriented to person, place, and time.  Psychiatric:        Mood and Affect: Mood normal.        Behavior: Behavior normal.      ASSESSMENT & PLAN: Problem List Items Addressed This Visit       Respiratory   Lower respiratory infection - Primary   Upper viral respiratory infection now with secondary bacterial infection Clinically stable.  No signs of pneumonia Recommend daily azithromycin  for 5 days Advised to rest and stay well-hydrated Symptom  management discussed ED precautions given Advised to contact the office if no better or worse during the next several days. Recommend follow-up with PCP      Relevant Medications   azithromycin  (ZITHROMAX ) 250 MG tablet   Chest congestion   Continue over-the-counter Mucinex  and Delsym May take Allegra as needed Advised to rest and stay well-hydrated        Other   Persistent cough   Cough management discussed Continue over-the-counter Mucinex  and Delsym Advised to stay well-hydrated Azithromycin  daily for 5 days      Patient Instructions  Acute Bronchitis, Adult  Acute bronchitis is when air tubes in the lungs (bronchi) suddenly get swollen. The condition can make it hard for you to breathe. In adults, acute bronchitis usually goes away within 2 weeks. A cough caused by bronchitis may last up to 3 weeks. Smoking, allergies, and asthma  can make the condition worse. What are the causes? Germs that cause cold and flu (viruses). The most common cause of this condition is the virus that causes the common cold. Bacteria. Substances that bother (irritate) the lungs, including: Smoke from cigarettes and other types of tobacco. Dust and pollen. Fumes from chemicals, gases, or burned fuel. Indoor or outdoor air pollution. What increases the risk? A weak body's defense system. This is also called the immune system. Any condition that affects your lungs and breathing, such as asthma. What are the signs or symptoms? A cough. Coughing up clear, yellow, or green mucus. Making high-pitched whistling sounds when you breathe, most often when you breathe out (wheezing). Runny or stuffy nose. Having too much mucus in your lungs (chest congestion). Shortness of breath. Body aches. A sore throat. How is this treated? Acute bronchitis may go away over time without treatment. Your doctor may tell you to: Drink more fluids. This will help thin your mucus so it is easier to cough up. Use a  device that gets medicine into your lungs (inhaler). Use a vaporizer or a humidifier. These are machines that add water  to the air. This helps with coughing and poor breathing. Take a medicine that thins mucus and helps clear it from your lungs. Take a medicine that prevents or stops coughing. It is not common to take an antibiotic medicine for this condition. Follow these instructions at home:  Take over-the-counter and prescription medicines only as told by your doctor. Use an inhaler, vaporizer, or humidifier as told by your doctor. Take two teaspoons (10 mL) of honey at bedtime. This helps lessen your coughing at night. Drink enough fluid to keep your pee (urine) pale yellow. Do not smoke or use any products that contain nicotine or tobacco. If you need help quitting, ask your doctor. Get a lot of rest. Return to your normal activities when your doctor says that it is safe. Keep all follow-up visits. How is this prevented?  Wash your hands often with soap and water  for at least 20 seconds. If you cannot use soap and water , use hand sanitizer. Avoid contact with people who have cold symptoms. Try not to touch your mouth, nose, or eyes with your hands. Avoid breathing in smoke or chemical fumes. Make sure to get the flu shot every year. Contact a doctor if: Your symptoms do not get better in 2 weeks. You have trouble coughing up the mucus. Your cough keeps you awake at night. You have a fever. Get help right away if: You cough up blood. You have chest pain. You have very bad shortness of breath. You faint or keep feeling like you are going to faint. You have a very bad headache. Your fever or chills get worse. These symptoms may be an emergency. Get help right away. Call your local emergency services (911 in the [place] Do not wait to see if the symptoms will go away. Do not drive yourself to the hospital. Summary Acute bronchitis is when air tubes in the lungs (bronchi)  suddenly get swollen. In adults, acute bronchitis usually goes away within 2 weeks. Drink more fluids. This will help thin your mucus so it is easier to cough up. Take over-the-counter and prescription medicines only as told by your doctor. Contact a doctor if your symptoms do not improve after 2 weeks of treatment. This information is not intended to replace advice given to you by your health care provider. Make sure you discuss any questions you  have with your health care provider. Document Revised: 09/14/2020 Document Reviewed: 09/14/2020 Elsevier Patient Education  2024 [employer]    Emil Schaumann, MD [facility]    [1]  Allergies Allergen Reactions   Lipitor [Atorvastatin ] Other (See Comments)    Muscle pain   Other Rash and Other (See Comments)    Detergent- Rashes on the skin

## 2024-05-12 NOTE — Assessment & Plan Note (Signed)
 Cough management discussed Continue over-the-counter Mucinex  and Delsym Advised to stay well-hydrated Azithromycin  daily for 5 days

## 2024-05-12 NOTE — Assessment & Plan Note (Signed)
 Continue over-the-counter Mucinex  and Delsym May take Allegra as needed Advised to rest and stay well-hydrated

## 2024-05-12 NOTE — Assessment & Plan Note (Signed)
 Upper viral respiratory infection now with secondary bacterial infection Clinically stable.  No signs of pneumonia Recommend daily azithromycin  for 5 days Advised to rest and stay well-hydrated Symptom management discussed ED precautions given Advised to contact the office if no better or worse during the next several days. Recommend follow-up with PCP

## 2024-05-24 ENCOUNTER — Encounter: Payer: Self-pay | Admitting: Internal Medicine

## 2024-06-23 ENCOUNTER — Telehealth: Payer: Self-pay | Admitting: *Deleted

## 2024-06-23 DIAGNOSIS — R350 Frequency of micturition: Secondary | ICD-10-CM

## 2024-06-23 DIAGNOSIS — R300 Dysuria: Secondary | ICD-10-CM

## 2024-06-23 MED ORDER — SULFAMETHOXAZOLE-TRIMETHOPRIM 800-160 MG PO TABS: 1.0000 | ORAL_TABLET | Freq: Two times a day (BID) | ORAL | 0 refills | Status: DC

## 2024-06-23 NOTE — Telephone Encounter (Signed)
 Pt will come tomorrow at 1000 to give urine specimen, then start Bactrim  DS BID for 5 days.

## 2024-06-23 NOTE — Telephone Encounter (Signed)
 Dillon Young states he thinks he has a UTI. Has to void every hour, straining, weak stream and burning. No blood noted. Has taken pyridium  with slight relief. Is asking if Dr Tina can call in an antibiotic.

## 2024-06-24 ENCOUNTER — Inpatient Hospital Stay

## 2024-06-24 DIAGNOSIS — Z8551 Personal history of malignant neoplasm of bladder: Secondary | ICD-10-CM | POA: Insufficient documentation

## 2024-06-24 DIAGNOSIS — R350 Frequency of micturition: Secondary | ICD-10-CM | POA: Insufficient documentation

## 2024-06-24 DIAGNOSIS — R300 Dysuria: Secondary | ICD-10-CM

## 2024-06-24 LAB — URINALYSIS, COMPLETE (UACMP) WITH MICROSCOPIC
Bilirubin Urine: NEGATIVE
Glucose, UA: NEGATIVE mg/dL
Ketones, ur: NEGATIVE mg/dL
Leukocytes,Ua: NEGATIVE
Nitrite: NEGATIVE
Protein, ur: NEGATIVE mg/dL
Specific Gravity, Urine: 1.003 — ABNORMAL LOW (ref 1.005–1.030)
pH: 7 (ref 5.0–8.0)

## 2024-06-25 ENCOUNTER — Ambulatory Visit: Payer: Self-pay

## 2024-06-25 LAB — URINE CULTURE: Culture: NO GROWTH

## 2024-06-25 NOTE — Telephone Encounter (Signed)
-----   Message from Pauletta [name], MD sent at 06/25/2024  9:29 AM EST ----- Dillon Young He called for sx of UTI and started antibiotics after he came in to collect urine cx. Please let him know urine culture was clear.   If his UTI symptoms improve or resolve after Bactrim ,  then that is fine.  If not I recommend follow-up with his urologist for evaluation thank you.

## 2024-07-01 ENCOUNTER — Other Ambulatory Visit: Payer: Self-pay | Admitting: Urology

## 2024-07-01 MED ORDER — HEPARIN SODIUM (PORCINE) 5000 UNIT/ML IJ SOLN: 5000.0000 [IU] | INTRAMUSCULAR | Status: DC

## 2024-07-01 NOTE — Patient Instructions (Signed)
 SURGICAL WAITING ROOM VISITATION  Patients having surgery or a procedure may have no more than 2 support people in the waiting area - these visitors may rotate.    Children ages 73 and under will not be able to visit patients in [facility] under most circumstances.   Visitors with respiratory illnesses are discouraged from visiting and should remain at home.  If the patient needs to stay at the hospital during part of their recovery, the visitor guidelines for inpatient rooms apply. Pre-op nurse will coordinate an appropriate time for 1 support person to accompany patient in pre-op.  This support person may not rotate.    Please refer to the [facility] website for the visitor guidelines for Inpatients (after your surgery is over and you are in a regular room).    Your procedure is scheduled on: 07/03/24   Report to [facility] Main Entrance    Report to admitting at 2:15 PM   Call this number if you have problems the morning of surgery [phone]   Do not eat food or drink liquids :After Midnight.          If you have questions, please contact your surgeons office.   FOLLOW BOWEL PREP AND ANY ADDITIONAL PRE OP INSTRUCTIONS YOU RECEIVED FROM YOUR SURGEON'S OFFICE!!!     Oral Hygiene is also important to reduce your risk of infection.                                    Remember - BRUSH YOUR TEETH THE MORNING OF SURGERY WITH YOUR REGULAR TOOTHPASTE  DENTURES WILL BE REMOVED PRIOR TO SURGERY PLEASE DO NOT APPLY Poly grip OR ADHESIVES!!!   Stop all vitamins and herbal supplements 7 days before surgery.   Take these medicines the morning of surgery with A SIP OF WATER :  Tylenol  Carvedilol  (Coreg ) Fexofenadine (Allegra) Hydralazine  (Apresoline ) Nasal Spray Eliquis    DO NOT TAKE ANY ORAL DIABETIC MEDICATIONS DAY OF YOUR SURGERY  How to Manage Your Diabetes Before and After Surgery  Why is it important to control my blood sugar before and after  surgery? Improving blood sugar levels before and after surgery helps healing and can limit problems. A way of improving blood sugar control is eating a healthy diet by:  Eating less sugar and carbohydrates  Increasing activity/exercise  Talking with your doctor about reaching your blood sugar goals High blood sugars (greater than 180 mg/dL) can raise your risk of infections and slow your recovery, so you will need to focus on controlling your diabetes during the weeks before surgery. Make sure that the doctor who takes care of your diabetes knows about your planned surgery including the date and location.  How do I manage my blood sugar before surgery? Check your blood sugar at least 4 times a day, starting 2 days before surgery, to make sure that the level is not too high or low. Check your blood sugar the morning of your surgery when you wake up and every 2 hours until you get to the Short Stay unit. If your blood sugar is less than 70 mg/dL, you will need to treat for low blood sugar: Do not take insulin . Treat a low blood sugar (less than 70 mg/dL) with  cup of clear juice (cranberry or apple), 4 glucose tablets, OR glucose gel. Recheck blood sugar in 15 minutes after treatment (to make sure it is greater  than 70 mg/dL). If your blood sugar is not greater than 70 mg/dL on recheck, call [phone] for further instructions. Report your blood sugar to the short stay nurse when you get to Short Stay.  If you are admitted to the hospital after surgery: Your blood sugar will be checked by the staff and you will probably be given insulin  after surgery (instead of oral diabetes medicines) to make sure you have good blood sugar levels. The goal for blood sugar control after surgery is 80-180 mg/dL.   Reviewed and Endorsed by [facility] Patient Education Committee, August 2015                              You may not have any metal on your body including jewelry, and body piercing              Do not wear lotions, powders, cologne, or deodorant              Men may shave face and neck.   Do not bring valuables to the hospital. [facility] IS NOT             RESPONSIBLE   FOR VALUABLES.   Contacts, glasses, dentures or bridgework may not be worn into surgery.  DO NOT BRING YOUR HOME MEDICATIONS TO THE HOSPITAL. PHARMACY WILL DISPENSE MEDICATIONS LISTED ON YOUR MEDICATION LIST TO YOU DURING YOUR ADMISSION IN THE HOSPITAL!    Patients discharged on the day of surgery will not be allowed to drive home.  Someone NEEDS to stay with you for the first 24 hours after anesthesia.   Special Instructions: Bring a copy of your healthcare power of attorney and living will documents the day of surgery if you haven't scanned them before.              Please read over the following fact sheets you were given: IF YOU HAVE QUESTIONS ABOUT YOUR PRE-OP INSTRUCTIONS PLEASE CALL [phone].   If you received a COVID test during your pre-op visit  it is requested that you wear a mask when out in public, stay away from anyone that may not be feeling well and notify your surgeon if you develop symptoms. If you test positive for Covid or have been in contact with anyone that has tested positive in the last 10 days please notify you surgeon.    [place] - Preparing for Surgery Before surgery, you can play an important role.  Because skin is not sterile, your skin needs to be as free of germs as possible.  You can reduce the number of germs on your skin by washing with CHG (chlorahexidine gluconate) soap before surgery.  CHG is an antiseptic cleaner which kills germs and bonds with the skin to continue killing germs even after washing. Please DO NOT use if you have an allergy to CHG or antibacterial soaps.  If your skin becomes reddened/irritated stop using the CHG and inform your nurse when you arrive at Short Stay. Do not shave (including legs and underarms) for at least 48 hours prior to the  first CHG shower.  You may shave your face/neck.  Please follow these instructions carefully:  1.  Shower with CHG Soap the night before surgery ONLY (DO NOT USE THE SOAP THE MORNING OF SURGERY).  2.  If you choose to wash your hair, wash your hair first as usual with your normal  shampoo.  3.  After you shampoo, rinse your hair and body thoroughly to remove the shampoo.                             4.  Use CHG as you would any other liquid soap.  You can apply chg directly to the skin and wash.  Gently with a scrungie or clean washcloth.  5.  Apply the CHG Soap to your body ONLY FROM THE NECK DOWN.   Do   not use on face/ open                           Wound or open sores. Avoid contact with eyes, ears mouth and   genitals (private parts).                       Wash face,  Genitals (private parts) with your normal soap.             6.  Wash thoroughly, paying special attention to the area where your    surgery  will be performed.  7.  Thoroughly rinse your body with warm water  from the neck down.  8.  DO NOT shower/wash with your normal soap after using and rinsing off the CHG Soap.                9.  Pat yourself dry with a clean towel.            10.  Wear clean pajamas.            11.  Place clean sheets on your bed the night of your first shower and do not  sleep with pets. Day of Surgery : Do not apply any CHG, lotions/deodorants the morning of surgery.  Please wear clean clothes to the hospital/surgery center.  FAILURE TO FOLLOW THESE INSTRUCTIONS MAY RESULT IN THE CANCELLATION OF YOUR SURGERY  PATIENT SIGNATURE_________________________________  NURSE SIGNATURE__________________________________  ________________________________________________________________________

## 2024-07-01 NOTE — Progress Notes (Signed)
 Date of COVID positive in last 90 days:  PCP - Debby Molt, MD Cardiologist - Dorn Lesches, MD Pulmonary- Dorn Chill, MD Oncology- Pauletta [name], MD  Chest x-ray - 01/01/24 Epic EKG - 04/08/24 Epic 115 a fib Stress Test - 2011 ECHO - 09/30/23 Epic Cardiac Cath - 12/26/17 Epic Pacemaker/ICD device last checked:N/A Spinal Cord Stimulator:N/A  Bowel Prep - N/A  Sleep Study - yes CPAP -  no per pt has improved   Fasting Blood Sugar - pre DM per pt, A1C 6.9, no meds Checks Blood Sugar _____ times a day  Last dose of GLP1 agonist-  N/A GLP1 instructions:  Do not take after     Last dose of SGLT-2 inhibitors-  N/A SGLT-2 instructions:  Do not take after     Blood Thinner Instructions: Eliquis , instructed to continue per surgeon's office Aspirin  Instructions:N/A Last Dose:  Activity level: Can go up a flight of stairs and perform activities of daily living without stopping and without symptoms of chest pain or shortness of breath.   Anesthesia review: HTN, CAD, a fibb, emphysema, DM, CKD,   Patient denies shortness of breath, fever, cough and chest pain at PAT appointment  Patient verbalized understanding of instructions that were given to them at the PAT appointment. Patient was also instructed that they will need to review over the PAT instructions again at home before surgery.

## 2024-07-01 NOTE — Progress Notes (Signed)
 Please place orders for PAT appointment scheduled 07/02/24.

## 2024-07-02 ENCOUNTER — Encounter (HOSPITAL_COMMUNITY): Payer: Self-pay

## 2024-07-02 ENCOUNTER — Other Ambulatory Visit: Payer: Self-pay

## 2024-07-02 ENCOUNTER — Encounter (HOSPITAL_COMMUNITY)
Admission: RE | Admit: 2024-07-02 | Discharge: 2024-07-02 | Disposition: A | Source: Ambulatory Visit | Attending: Urology | Admitting: Urology

## 2024-07-02 VITALS — BP 158/76 | HR 68 | Temp 97.5°F | Resp 16 | Ht 68.0 in | Wt 167.0 lb

## 2024-07-02 DIAGNOSIS — E119 Type 2 diabetes mellitus without complications: Secondary | ICD-10-CM

## 2024-07-02 DIAGNOSIS — I10 Essential (primary) hypertension: Secondary | ICD-10-CM

## 2024-07-02 LAB — CBC
HCT: 38.9 % — ABNORMAL LOW (ref 39.0–52.0)
Hemoglobin: 11.7 g/dL — ABNORMAL LOW (ref 13.0–17.0)
MCH: 25.3 pg — ABNORMAL LOW (ref 26.0–34.0)
MCHC: 30.1 g/dL (ref 30.0–36.0)
MCV: 84 fL (ref 80.0–100.0)
Platelets: 151 10*3/uL (ref 150–400)
RBC: 4.63 MIL/uL (ref 4.22–5.81)
RDW: 15.9 % — ABNORMAL HIGH (ref 11.5–15.5)
WBC: 4.4 10*3/uL (ref 4.0–10.5)
nRBC: 0 % (ref 0.0–0.2)

## 2024-07-02 LAB — BASIC METABOLIC PANEL WITH GFR
Anion gap: 9 (ref 5–15)
BUN: 29 mg/dL — ABNORMAL HIGH (ref 8–23)
CO2: 28 mmol/L (ref 22–32)
Calcium: 9.6 mg/dL (ref 8.9–10.3)
Chloride: 104 mmol/L (ref 98–111)
Creatinine, Ser: 1.29 mg/dL — ABNORMAL HIGH (ref 0.61–1.24)
GFR, Estimated: 53 mL/min — ABNORMAL LOW
Glucose, Bld: 105 mg/dL — ABNORMAL HIGH (ref 70–99)
Potassium: 5.2 mmol/L — ABNORMAL HIGH (ref 3.5–5.1)
Sodium: 140 mmol/L (ref 135–145)

## 2024-07-02 LAB — HEMOGLOBIN A1C
Hgb A1c MFr Bld: 6.4 % — ABNORMAL HIGH (ref 4.8–5.6)
Mean Plasma Glucose: 136.98 mg/dL

## 2024-07-02 LAB — GLUCOSE, CAPILLARY: Glucose-Capillary: 95 mg/dL (ref 70–99)

## 2024-07-02 NOTE — Anesthesia Preprocedure Evaluation (Signed)
"                                    Anesthesia Evaluation  Patient identified by MRN, date of birth, ID band Patient awake    Reviewed: Allergy & Precautions, H&P , NPO status , Patient's Chart, lab work & pertinent test results  Airway Mallampati: II  TM Distance: >3 FB Neck ROM: Full    Dental  (+) Dental Advisory Given   Pulmonary sleep apnea , COPD, former smoker   Pulmonary exam normal breath sounds clear to auscultation       Cardiovascular hypertension, Pt. on medications + CAD and +CHF  Normal cardiovascular exam+ dysrhythmias Atrial Fibrillation  Rhythm:Regular Rate:Normal     Neuro/Psych  Headaches CVA  negative psych ROS   GI/Hepatic Neg liver ROS,GERD  ,,  Endo/Other  diabetes, Type 2    Renal/GU Renal disease  negative genitourinary   Musculoskeletal  (+) Arthritis , Osteoarthritis,    Abdominal   Peds negative pediatric ROS (+)  Hematology negative hematology ROS (+)   Anesthesia Other Findings   Reproductive/Obstetrics negative OB ROS                              Anesthesia Physical Anesthesia Plan  ASA: 3  Anesthesia Plan: General   Post-op Pain Management:    Induction: Intravenous  PONV Risk Score and Plan: 2 and Ondansetron , Midazolam  and Treatment may vary due to age or medical condition  Airway Management Planned: LMA  Additional Equipment:   Intra-op Plan:   Post-operative Plan: Extubation in OR  Informed Consent: I have reviewed the patients History and Physical, chart, labs and discussed the procedure including the risks, benefits and alternatives for the proposed anesthesia with the patient or authorized representative who has indicated his/her understanding and acceptance.     Dental advisory given  Plan Discussed with: CRNA  Anesthesia Plan Comments: (See PAT note from 2/5)         Anesthesia Quick Evaluation  "

## 2024-07-02 NOTE — Progress Notes (Signed)
 " Case: [id number] Date/Time: 07/03/24 1615   Procedure: CYSTOSCOPY, WITH BLADDER CALCULUS LITHOLAPAXY   Anesthesia type: General   Diagnosis: Urinary bladder stone [N21.0]   Pre-op diagnosis: BLADDER STONE   [facility]   Surgeons: Watt Rush, MD       DISCUSSION: Dillon Young is an 89 yo male with PMH of former smoking, HTN, CAD s/p multiple PCIs (1994, 1995, 2017, 2019), ischemic CM with recovered EF, A-fib on Eliquis , carotid artery disease s/p R CEA (10/2011), clotting disorder, mild COPD, OSA (no CPAP), history of CVA, CKD, anemia, prostate cancer s/p TURP (2005, 2022) and radiation, bladder cancer s/p TURBT (09/2023).  Patient follows with Cardiology for CAD s/p multiple PCIs/stenting - last in 2019. Had ischemic CM with recovered EF by last Echo in 09/2023. Also with persistent A.fib on Eliquis . He is s/p multiple cardioversions - last was 08/2022. Still in rate controlled a.fib. Per notes patient has declined further intervention. Last seen on 04/03/24 by Dr. Court. Noted to be stable at that visit. He denied any chest pain or SOB. Advised f/u in 6 months.  Seen by PA Terra in A.fib clinic on 04/08/24. HR was noted to be elevated (115 in clinic) without obvious cause. Coreg  dose increased. Advised f/u in 1 year. Patient called after dose was adjusted and HR appeared in normal range.  Patient follows with Oncology for bladder cancer. Current treatment is being held due to burning sensation in bladder.  Seen by PCP on 05/12/24 for flu like symptoms. No swabs or CXR obtained. Z-pak prescribed.   Pt will continue Eliquis  in peri op period   VS: BP (!) 158/76   Pulse 68   Temp (!) 36.4 C (Oral)   Resp 16   Ht 5' 8 (1.727 m)   Wt 75.8 kg   SpO2 100%   BMI 25.39 kg/m   PROVIDERS: [name], MD   LABS: Labs reviewed: Acceptable for surgery. (all labs ordered are listed, but only abnormal results are displayed)  Labs Reviewed  CBC - Abnormal; Notable  for the following components:      Result Value   Hemoglobin 11.7 (*)    HCT 38.9 (*)    MCH 25.3 (*)    RDW 15.9 (*)    All other components within normal limits  BASIC METABOLIC PANEL WITH GFR - Abnormal; Notable for the following components:   Potassium 5.2 (*)    Glucose, Bld 105 (*)    BUN 29 (*)    Creatinine, Ser 1.29 (*)    GFR, Estimated 53 (*)    All other components within normal limits  HEMOGLOBIN A1C - Abnormal; Notable for the following components:   Hgb A1c MFr Bld 6.4 (*)    All other components within normal limits  GLUCOSE, CAPILLARY      Echo 09/30/23:  IMPRESSIONS    1. Left ventricular ejection fraction, by estimation, is 55 to 60%. The left ventricle has normal function. The left ventricle has no regional wall motion abnormalities. Left ventricular diastolic function could not be evaluated.  2. Right ventricular systolic function is normal. The right ventricular size is normal.  3. The mitral valve is normal in structure. No evidence of mitral valve regurgitation. No evidence of mitral stenosis.  4. The aortic valve is tricuspid. Aortic valve regurgitation is not visualized. Aortic valve sclerosis is present, with no evidence of aortic valve stenosis.  Comparison(s): A prior study was performed on 11/03/2021. No  significant change from prior study.   LHC 12/26/2017:   Previously placed Prox RCA to Mid RCA stent (unknown type) is widely patent. Prox LAD to Mid LAD lesion is 80% stenosed. A stent was successfully placed. Post intervention, there is a 0% residual stenosis. There is severe left ventricular systolic dysfunction. LV end diastolic pressure is mildly elevated. The left ventricular ejection fraction is less than 25% by visual estimate.  Past Medical History:  Diagnosis Date   Adenomatous colon polyp    Allergic rhinitis    Anemia    Bladder cancer (HCC)    currently on bcg treatments   BPH (benign prostatic hyperplasia)    CAD  (coronary artery disease)    sees Dr. Dorn Lesches    Carotid artery disease    CHF (congestive heart failure) (HCC)    Chronic kidney disease    Sones. Prostate cancer   Clotting disorder    CVA (cerebral infarction)    Diverticulitis    Dysrhythmia    atrial fib - hx of    Edema    recent swelling in feet and ankles    Hemorrhoids    History of kidney stones    HTN (hypertension)    Hx of radiation therapy    for prostate - 2013    Hyperlipidemia LDL goal <70    IBS (irritable bowel syndrome)    Low back pain    Pre-diabetes    Prostate cancer (HCC) 2008   seed implantation   Sleep apnea    sitting in recliner helps him to breathe per pt , never used cpap sleep study - pt reports could not go to sleep- 6 years ago    Smoker    Stroke [facility])    hx of mini strokes years ago     Past Surgical History:  Procedure Laterality Date   APPENDECTOMY     CARDIAC CATHETERIZATION N/A 01/16/2016   Procedure: Left Heart Cath and Coronary Angiography;  Surgeon: Candyce GORMAN Reek, MD;  Location: [facility] INVASIVE CV LAB;  Service: Cardiovascular;  Laterality: N/A;   CARDIAC CATHETERIZATION N/A 01/16/2016   Procedure: Coronary Stent Intervention;  Surgeon: Candyce GORMAN Reek, MD;  Location: [facility] INVASIVE CV LAB;  Service: Cardiovascular;  Laterality: N/A;   CARDIOVERSION N/A 11/26/2017   Procedure: CARDIOVERSION;  Surgeon: Mona Vinie BROCKS, MD;  Location: [facility] ENDOSCOPY;  Service: Cardiovascular;  Laterality: N/A;   CARDIOVERSION N/A 03/13/2018   Procedure: CARDIOVERSION;  Surgeon: Mona Vinie BROCKS, MD;  Location: [facility] ENDOSCOPY;  Service: Cardiovascular;  Laterality: N/A;   CARDIOVERSION N/A 07/31/2022   Procedure: CARDIOVERSION;  Surgeon: Pietro Redell GORMAN, MD;  Location: [facility] ENDOSCOPY;  Service: Cardiovascular;  Laterality: N/A;   CARDIOVERSION N/A 09/10/2022   Procedure: CARDIOVERSION;  Surgeon: Okey Vina GAILS, MD;  Location: [facility] INVASIVE CV LAB;  Service: Cardiovascular;  Laterality: N/A;   CATARACT  EXTRACTION W/ INTRAOCULAR LENS  IMPLANT, BILATERAL Bilateral    COLONOSCOPY  10/21/2008   diverticulosis, radiation proctitis, internal hemorrhoids   CORONARY ANGIOPLASTY     CORONARY STENT INTERVENTION N/A 12/26/2017   Procedure: CORONARY STENT INTERVENTION;  Surgeon: Lesches Dorn PARAS, MD;  Location: [facility] INVASIVE CV LAB;  Service: Cardiovascular;  Laterality: N/A;   CYSTOSCOPY WITH BIOPSY N/A 09/10/2023   Procedure: CYSTOSCOPY, WITH BIOPSY/BILATERAL RETROGRADE;  Surgeon: Watt Rush, MD;  [facility];  Service: Urology;  Laterality: N/A;  BILATERAL RETROGRADE   ENDARTERECTOMY  11/12/2011   Procedure: ENDARTERECTOMY CAROTID;  Surgeon: Krystal JULIANNA Doing, MD;  Location: [facility] OR;  Service: Vascular;  Laterality: Right;   ESOPHAGOGASTRODUODENOSCOPY (EGD) WITH ESOPHAGEAL DILATION     HEMORRHOID BANDING     LEFT HEART CATH AND CORONARY ANGIOGRAPHY N/A 12/26/2017   Procedure: LEFT HEART CATH AND CORONARY ANGIOGRAPHY;  Surgeon: [name], MD;  Location: [facility] INVASIVE CV LAB;  Service: Cardiovascular;  Laterality: N/A;   LITHOTRIPSY  X 3   LUMBAR LAMINECTOMY     TRANSURETHRAL RESECTION OF BLADDER TUMOR N/A 10/15/2023   Procedure: TURBT (TRANSURETHRAL RESECTION OF BLADDER TUMOR);  Surgeon: Watt Rush, MD;  [facility];  Service: Urology;  Laterality: N/A;   TRANSURETHRAL RESECTION OF BLADDER TUMOR WITH MITOMYCIN -C Bilateral 01/06/2021   Procedure: TRANSURETHRAL RESECTION OF BLADDER TUMOR WITH GEMCITABINE  BILATERAL RETROGRADES;  Surgeon: Watt Rush, MD;  [facility];  Service: Urology;  Laterality: Bilateral;   TRANSURETHRAL RESECTION OF PROSTATE  10/2003   TRANSURETHRAL RESECTION OF PROSTATE N/A 05/31/2020   Procedure: CYSTOSCOPY TRANSURETHRAL RESECTION BIOPSY AND FULGURATION OF PROSTATE LESION  (TURP);  Surgeon: Watt Rush, MD;  [facility];  Service: Urology;  Laterality: N/A;    MEDICATIONS:  acetaminophen  (TYLENOL ) 500 MG tablet   ascorbic acid  (VITAMIN C ) 500 MG tablet   carvedilol   (COREG ) 12.5 MG tablet   Cholecalciferol  (VITAMIN D -3) 25 MCG (1000 UT) CAPS   Cyanocobalamin  (VITAMIN B-12 IJ)   cyanocobalamin  2000 MCG tablet   dicyclomine  (BENTYL ) 10 MG capsule   ELIQUIS  5 MG TABS tablet   fexofenadine (ALLEGRA) 180 MG tablet   fluticasone  (FLONASE ) 50 MCG/ACT nasal spray   gabapentin  (NEURONTIN ) 300 MG capsule   hydrALAZINE  (APRESOLINE ) 25 MG tablet   hydrocortisone  (ANUSOL -HC) 2.5 % rectal cream   Hypromellose (ARTIFICIAL TEARS OP)   ipratropium (ATROVENT ) 0.06 % nasal spray   levocetirizine (XYZAL ) 5 MG tablet   melatonin 5 MG TABS   nitroGLYCERIN  (NITROSTAT ) 0.4 MG SL tablet   ondansetron  (ZOFRAN ) 8 MG tablet   prochlorperazine  (COMPAZINE ) 10 MG tablet   rosuvastatin  (CRESTOR ) 10 MG tablet   sacubitril -valsartan  (ENTRESTO ) 49-51 MG   sodium bicarbonate  650 MG tablet   sodium chloride  (OCEAN) 0.65 % SOLN nasal spray   SPIRIVA  RESPIMAT 2.5 MCG/ACT AERS   spironolactone  (ALDACTONE ) 25 MG tablet   sulfamethoxazole -trimethoprim  (BACTRIM  DS) 800-160 MG tablet   triamcinolone cream (KENALOG) 0.1 %   No current facility-administered medications for this encounter.   Burnard CHRISTELLA Odis DEVONNA [facility] Pre-Surgical Testing [facility] Phone [phone] 07/02/2024 9:34 PM        "

## 2024-07-03 ENCOUNTER — Ambulatory Visit (HOSPITAL_COMMUNITY)

## 2024-07-03 ENCOUNTER — Encounter (HOSPITAL_COMMUNITY): Admission: RE | Disposition: A | Payer: Self-pay | Source: Home / Self Care | Attending: Urology

## 2024-07-03 ENCOUNTER — Other Ambulatory Visit: Payer: Self-pay | Admitting: Cardiovascular Disease

## 2024-07-03 ENCOUNTER — Ambulatory Visit (HOSPITAL_COMMUNITY)
Admission: RE | Admit: 2024-07-03 | Discharge: 2024-07-03 | Disposition: A | Source: Home / Self Care | Attending: Urology | Admitting: Urology

## 2024-07-03 ENCOUNTER — Encounter (HOSPITAL_COMMUNITY): Payer: Self-pay | Admitting: Medical

## 2024-07-03 ENCOUNTER — Encounter (HOSPITAL_COMMUNITY): Payer: Self-pay | Admitting: Urology

## 2024-07-03 ENCOUNTER — Other Ambulatory Visit: Payer: Self-pay

## 2024-07-03 LAB — GLUCOSE, CAPILLARY: Glucose-Capillary: 96 mg/dL (ref 70–99)

## 2024-07-03 MED ORDER — PHENYLEPHRINE HCL (PRESSORS) 10 MG/ML IV SOLN
INTRAVENOUS | Status: DC | PRN
  Administered 2024-07-03: 160 ug via INTRAVENOUS
  Administered 2024-07-03 (×2): 80 ug via INTRAVENOUS
  Administered 2024-07-03 (×3): 160 ug via INTRAVENOUS

## 2024-07-03 MED ORDER — HYDROMORPHONE HCL 1 MG/ML IJ SOLN: 0.2500 mg | INTRAMUSCULAR | Status: AC | PRN

## 2024-07-03 MED ORDER — SODIUM CHLORIDE 0.9% FLUSH: 3.0000 mL | Freq: Two times a day (BID) | INTRAVENOUS | Status: AC

## 2024-07-03 MED ORDER — EPHEDRINE 5 MG/ML INJ
INTRAVENOUS | Status: AC
  Filled 2024-07-03: qty 5

## 2024-07-03 MED ORDER — AMISULPRIDE (ANTIEMETIC) 5 MG/2ML IV SOLN: 10.0000 mg | Freq: Once | INTRAVENOUS | Status: AC | PRN

## 2024-07-03 MED ORDER — CHLORHEXIDINE GLUCONATE 0.12 % MT SOLN
15.0000 mL | Freq: Once | OROMUCOSAL | Status: AC
  Administered 2024-07-03: 15 mL via OROMUCOSAL

## 2024-07-03 MED ORDER — FENTANYL CITRATE (PF) 100 MCG/2ML IJ SOLN
INTRAMUSCULAR | Status: AC
  Filled 2024-07-03: qty 2

## 2024-07-03 MED ORDER — ORAL CARE MOUTH RINSE: 15.0000 mL | Freq: Once | OROMUCOSAL | Status: AC

## 2024-07-03 MED ORDER — ONDANSETRON HCL 4 MG/2ML IJ SOLN
INTRAMUSCULAR | Status: AC
  Filled 2024-07-03: qty 2

## 2024-07-03 MED ORDER — STERILE WATER FOR IRRIGATION IR SOLN
Status: DC | PRN
  Administered 2024-07-03: 3000 mL

## 2024-07-03 MED ORDER — EPHEDRINE SULFATE (PRESSORS) 25 MG/5ML IV SOSY
PREFILLED_SYRINGE | INTRAVENOUS | Status: DC | PRN
  Administered 2024-07-03: 5 mg via INTRAVENOUS

## 2024-07-03 MED ORDER — LACTATED RINGERS IV SOLN: INTRAVENOUS | Status: AC

## 2024-07-03 MED ORDER — FENTANYL CITRATE (PF) 100 MCG/2ML IJ SOLN
INTRAMUSCULAR | Status: DC | PRN
  Administered 2024-07-03: 50 ug via INTRAVENOUS

## 2024-07-03 MED ORDER — OXYCODONE HCL 5 MG/5ML PO SOLN: 5.0000 mg | Freq: Once | ORAL | Status: AC | PRN

## 2024-07-03 MED ORDER — OXYCODONE HCL 5 MG PO TABS: 5.0000 mg | ORAL_TABLET | Freq: Once | ORAL | Status: AC | PRN

## 2024-07-03 MED ORDER — PROPOFOL 10 MG/ML IV BOLUS
INTRAVENOUS | Status: DC | PRN
  Administered 2024-07-03: 160 mg via INTRAVENOUS

## 2024-07-03 MED ORDER — CEFAZOLIN SODIUM-DEXTROSE 2-4 GM/100ML-% IV SOLN
2.0000 g | INTRAVENOUS | Status: AC
  Administered 2024-07-03: 2 g via INTRAVENOUS
  Filled 2024-07-03: qty 100

## 2024-07-03 MED ORDER — DEXAMETHASONE SOD PHOSPHATE PF 10 MG/ML IJ SOLN
INTRAMUSCULAR | Status: AC
  Filled 2024-07-03: qty 1

## 2024-07-03 MED ORDER — ONDANSETRON HCL 4 MG/2ML IJ SOLN
INTRAMUSCULAR | Status: DC | PRN
  Administered 2024-07-03: 4 mg via INTRAVENOUS

## 2024-07-03 MED ORDER — PHENYLEPHRINE 80 MCG/ML (10ML) SYRINGE FOR IV PUSH (FOR BLOOD PRESSURE SUPPORT)
PREFILLED_SYRINGE | INTRAVENOUS | Status: AC
  Filled 2024-07-03: qty 10

## 2024-07-03 MED ORDER — LIDOCAINE HCL (PF) 2 % IJ SOLN
INTRAMUSCULAR | Status: AC
  Filled 2024-07-03: qty 10

## 2024-07-03 MED ORDER — LIDOCAINE HCL (CARDIAC) PF 100 MG/5ML IV SOSY
PREFILLED_SYRINGE | INTRAVENOUS | Status: DC | PRN
  Administered 2024-07-03: 80 mg via INTRAVENOUS

## 2024-07-03 NOTE — Anesthesia Procedure Notes (Signed)
 Procedure Name: LMA Insertion Date/Time: 07/03/2024 5:08 PM  Performed by: Vincenzo Show, CRNAPre-anesthesia Checklist: Patient identified, Emergency Drugs available, Suction available, Patient being monitored and Timeout performed Patient Re-evaluated:Patient Re-evaluated prior to induction Oxygen Delivery Method: Circle system utilized Preoxygenation: Pre-oxygenation with 100% oxygen Induction Type: IV induction Ventilation: Mask ventilation without difficulty LMA: LMA inserted LMA Size: 4.0 Tube size: 4.0 mm Number of attempts: 1 Placement Confirmation: positive ETCO2 and breath sounds checked- equal and bilateral Tube secured with: Tape Dental Injury: Teeth and Oropharynx as per pre-operative assessment  Comments: LMA easy pass.

## 2024-07-03 NOTE — Telephone Encounter (Signed)
 Prescription refill request for Eliquis  received. Indication: AFIB Last office visit: 03/2024 Scr: 1.29 (07/02/24) Age: 89 Weight: 75.8KG

## 2024-07-03 NOTE — Interval H&P Note (Signed)
 History and Physical Interval Note:  07/03/2024 4:09 PM  Dillon Young  has presented today for surgery, with the diagnosis of BLADDER STONE.  The various methods of treatment have been discussed with the patient and family. After consideration of risks, benefits and other options for treatment, the patient has consented to  Procedures: CYSTOSCOPY, WITH BLADDER CALCULUS LITHOLAPAXY (N/A) as a surgical intervention.  The patient's history has been reviewed, patient examined, no change in status, stable for surgery.  I have reviewed the patient's chart and labs.  Questions were answered to the patient's satisfaction.     Anael Rosch

## 2024-07-03 NOTE — Transfer of Care (Signed)
 Immediate Anesthesia Transfer of Care Note  Patient: Dillon Young  Procedure(s) Performed: Procedures: CYSTOSCOPY, WITH BLADDER CALCULUS LITHOLAPAXY (N/A)  Patient Location: PACU  Anesthesia Type:General  Level of Consciousness:  sedated, patient cooperative and responds to stimulation  Airway & Oxygen Therapy:Patient Spontanous Breathing and Patient connected to face mask oxgen  Post-op Assessment:  Report given to PACU RN and Post -op Vital signs reviewed and stable  Post vital signs:  Reviewed and stable  Last Vitals:  Vitals:   07/03/24 1424 07/03/24 1740  BP: 129/81 (!) 117/59  Pulse: 99 72  Resp: 18   Temp: 37.1 C   SpO2: 99% 97%    Complications: No apparent anesthesia complications

## 2024-07-03 NOTE — Discharge Instructions (Addendum)
 CYSTOSCOPY HOME CARE INSTRUCTIONS  Activity: Rest for the remainder of the day.  Do not drive or operate equipment today.  You may resume normal activities in one to two days as instructed by your physician.   Meals: Drink plenty of liquids and eat light foods such as gelatin or soup this evening.  You may return to a normal meal plan tomorrow.  Return to Work: You may return to work in one to two days or as instructed by your physician.  Special Instructions / Symptoms: Call your physician if any of these symptoms occur:   -persistent or heavy bleeding  -bleeding which continues after first few urination  -large blood clots that are difficult to pass  -urine stream diminishes or stops completely  -fever equal to or higher than 101 degrees Farenheit.  -cloudy urine with a strong, foul odor  -severe pain  Please bring the stone fragments to the office at f/u on 07/16/24.   Patient Signature:  ________________________________________________________  Nurse's Signature:  ________________________________________________________

## 2024-07-03 NOTE — Op Note (Signed)
 Procedure: Cystolitholapaxy of less than 2.5 cm stone.  Pre-op diagnosis: Bladder stone.  Postop diagnosis: Same.  Surgeon: Dr. Norleen Seltzer.  Anesthesia: General.  Drains: None.  Specimen: Stone fragments, given to family.  EBL: None.  Complications: None.  Indications: Patient is an 89 year old male who was recently undergoing surveillance cystoscopy for his history of bladder cancer he was found to have approximately 8 to 9 mm stone lodged in the prostatic urethra.  The stone was pushed back into the bladder and bladder inspection revealed no recurrent tumors but because of the obstructing nature of the stone it was felt that cystolitholapaxy was indicated.  Procedure: He was taken the operating room and was given Ancef .  A general anesthetic was induced.  He was placed in lithotomy position and fitted with PAS hose.  His perineum and genitalia were prepped with Betadine solution and draped in usual sterile fashion.  Cystoscopy was performed using 21 French scope and 30 degree lens.  Examination revealed a normal urethra.  The external sphincter was somewhat patulous.  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  It was pushed back into the bladder.  Inspection of the bladder revealed 2 stones 1 approximately 9 mm the other approximately 7 mm.  Neither stone was sufficiently small to be aspirated through the scope intact.  Inspection of the bladder mucosa revealed no recurrent tumors areas of erythema.  The ureteral orifices were unremarkable.  A 500 m holmium laser fiber was then passed and the Moses laser was put on the lithotripsy setting and the stone was broken into fragments.  Several of the fragments were removed but there were 2 larger fragments that required additional fragmentation with the laser set on 0.4 J and 50 Hz.  This resulted in additional fragmentation and those residual fragments  and grit were removed.  Final inspection revealed no retained stone material and no active bleeding.  There was no evidence of urothelial injury.  The bladder was partially drained and the cystoscope was removed.  He was taken down from lithotomy position, his anesthetic was reversed and he was moved to recovery in stable condition.  The stones were collected and given to his wife.

## 2024-07-14 ENCOUNTER — Inpatient Hospital Stay

## 2024-07-27 ENCOUNTER — Encounter: Admitting: Internal Medicine
